# Patient Record
Sex: Female | Born: 1935 | Race: White | Hispanic: No | Smoking: Former smoker
Health system: Southern US, Community
[De-identification: ages and names within clinical notes are randomized; demographics above are authoritative.]

## PROBLEM LIST (undated history)

## (undated) DIAGNOSIS — E876 Hypokalemia: Secondary | ICD-10-CM

## (undated) DIAGNOSIS — S301XXA Contusion of abdominal wall, initial encounter: Secondary | ICD-10-CM

## (undated) DIAGNOSIS — E7800 Pure hypercholesterolemia, unspecified: Secondary | ICD-10-CM

## (undated) DIAGNOSIS — G629 Polyneuropathy, unspecified: Secondary | ICD-10-CM

## (undated) DIAGNOSIS — Z9989 Dependence on other enabling machines and devices: Secondary | ICD-10-CM

## (undated) DIAGNOSIS — Z9981 Dependence on supplemental oxygen: Secondary | ICD-10-CM

## (undated) DIAGNOSIS — M545 Low back pain, unspecified: Secondary | ICD-10-CM

## (undated) DIAGNOSIS — I219 Acute myocardial infarction, unspecified: Secondary | ICD-10-CM

## (undated) DIAGNOSIS — Z9289 Personal history of other medical treatment: Secondary | ICD-10-CM

## (undated) DIAGNOSIS — C4491 Basal cell carcinoma of skin, unspecified: Secondary | ICD-10-CM

## (undated) DIAGNOSIS — N183 Chronic kidney disease, stage 3 (moderate): Secondary | ICD-10-CM

## (undated) DIAGNOSIS — G709 Myoneural disorder, unspecified: Secondary | ICD-10-CM

## (undated) DIAGNOSIS — I5022 Chronic systolic (congestive) heart failure: Secondary | ICD-10-CM

## (undated) DIAGNOSIS — Z95810 Presence of automatic (implantable) cardiac defibrillator: Secondary | ICD-10-CM

## (undated) DIAGNOSIS — F329 Major depressive disorder, single episode, unspecified: Secondary | ICD-10-CM

## (undated) DIAGNOSIS — G8929 Other chronic pain: Secondary | ICD-10-CM

## (undated) DIAGNOSIS — D62 Acute posthemorrhagic anemia: Secondary | ICD-10-CM

## (undated) DIAGNOSIS — M109 Gout, unspecified: Secondary | ICD-10-CM

## (undated) DIAGNOSIS — M1990 Unspecified osteoarthritis, unspecified site: Secondary | ICD-10-CM

## (undated) DIAGNOSIS — T4145XA Adverse effect of unspecified anesthetic, initial encounter: Secondary | ICD-10-CM

## (undated) DIAGNOSIS — I480 Paroxysmal atrial fibrillation: Secondary | ICD-10-CM

## (undated) DIAGNOSIS — I10 Essential (primary) hypertension: Secondary | ICD-10-CM

## (undated) DIAGNOSIS — Z950 Presence of cardiac pacemaker: Secondary | ICD-10-CM

## (undated) DIAGNOSIS — T8859XA Other complications of anesthesia, initial encounter: Secondary | ICD-10-CM

## (undated) DIAGNOSIS — R5381 Other malaise: Secondary | ICD-10-CM

## (undated) DIAGNOSIS — G4733 Obstructive sleep apnea (adult) (pediatric): Secondary | ICD-10-CM

## (undated) DIAGNOSIS — J189 Pneumonia, unspecified organism: Secondary | ICD-10-CM

## (undated) DIAGNOSIS — I509 Heart failure, unspecified: Secondary | ICD-10-CM

## (undated) DIAGNOSIS — I2510 Atherosclerotic heart disease of native coronary artery without angina pectoris: Secondary | ICD-10-CM

## (undated) HISTORY — PX: CARDIAC CATHETERIZATION: SHX172

## (undated) HISTORY — PX: CHOLECYSTECTOMY OPEN: SUR202

## (undated) HISTORY — PX: CATARACT EXTRACTION, BILATERAL: SHX1313

## (undated) HISTORY — DX: Hypokalemia: E87.6

## (undated) HISTORY — DX: Other malaise: R53.81

## (undated) HISTORY — PX: TOTAL ABDOMINAL HYSTERECTOMY: SHX209

## (undated) HISTORY — PX: TONSILLECTOMY: SUR1361

## (undated) HISTORY — DX: Contusion of abdominal wall, initial encounter: S30.1XXA

## (undated) HISTORY — PX: ACHILLES TENDON SURGERY: SHX542

---

## 1981-06-18 HISTORY — PX: BREAST CYST EXCISION: SHX579

## 1999-06-30 ENCOUNTER — Encounter: Payer: Self-pay | Admitting: Obstetrics and Gynecology

## 1999-06-30 ENCOUNTER — Encounter: Admission: RE | Admit: 1999-06-30 | Discharge: 1999-06-30 | Payer: Self-pay | Admitting: Obstetrics and Gynecology

## 2000-07-01 ENCOUNTER — Encounter: Admission: RE | Admit: 2000-07-01 | Discharge: 2000-07-01 | Payer: Self-pay | Admitting: Internal Medicine

## 2000-07-01 ENCOUNTER — Encounter: Payer: Self-pay | Admitting: Internal Medicine

## 2001-07-08 ENCOUNTER — Encounter: Payer: Self-pay | Admitting: Internal Medicine

## 2001-07-08 ENCOUNTER — Encounter: Admission: RE | Admit: 2001-07-08 | Discharge: 2001-07-08 | Payer: Self-pay | Admitting: Internal Medicine

## 2002-07-09 ENCOUNTER — Encounter: Admission: RE | Admit: 2002-07-09 | Discharge: 2002-07-09 | Payer: Self-pay | Admitting: Internal Medicine

## 2002-07-09 ENCOUNTER — Encounter: Payer: Self-pay | Admitting: Internal Medicine

## 2003-08-16 ENCOUNTER — Encounter: Admission: RE | Admit: 2003-08-16 | Discharge: 2003-08-16 | Payer: Self-pay | Admitting: Internal Medicine

## 2003-09-07 ENCOUNTER — Inpatient Hospital Stay (HOSPITAL_COMMUNITY): Admission: EM | Admit: 2003-09-07 | Discharge: 2003-09-08 | Payer: Self-pay | Admitting: Emergency Medicine

## 2004-08-31 ENCOUNTER — Encounter: Admission: RE | Admit: 2004-08-31 | Discharge: 2004-08-31 | Payer: Self-pay | Admitting: Internal Medicine

## 2005-09-06 ENCOUNTER — Encounter: Admission: RE | Admit: 2005-09-06 | Discharge: 2005-09-06 | Payer: Self-pay | Admitting: Internal Medicine

## 2006-06-18 DIAGNOSIS — I219 Acute myocardial infarction, unspecified: Secondary | ICD-10-CM

## 2006-06-18 HISTORY — DX: Acute myocardial infarction, unspecified: I21.9

## 2006-08-08 ENCOUNTER — Inpatient Hospital Stay (HOSPITAL_COMMUNITY): Admission: EM | Admit: 2006-08-08 | Discharge: 2006-08-10 | Payer: Self-pay | Admitting: Emergency Medicine

## 2006-08-08 ENCOUNTER — Ambulatory Visit: Payer: Self-pay | Admitting: Vascular Surgery

## 2006-08-09 ENCOUNTER — Encounter (INDEPENDENT_AMBULATORY_CARE_PROVIDER_SITE_OTHER): Payer: Self-pay | Admitting: Interventional Cardiology

## 2006-08-28 ENCOUNTER — Inpatient Hospital Stay (HOSPITAL_COMMUNITY): Admission: EM | Admit: 2006-08-28 | Discharge: 2006-09-02 | Payer: Self-pay | Admitting: Emergency Medicine

## 2006-09-09 ENCOUNTER — Encounter: Admission: RE | Admit: 2006-09-09 | Discharge: 2006-09-09 | Payer: Self-pay | Admitting: Internal Medicine

## 2006-09-13 ENCOUNTER — Inpatient Hospital Stay (HOSPITAL_COMMUNITY): Admission: EM | Admit: 2006-09-13 | Discharge: 2006-09-20 | Payer: Self-pay | Admitting: Emergency Medicine

## 2007-01-17 HISTORY — PX: INSERT / REPLACE / REMOVE PACEMAKER: SUR710

## 2007-01-30 ENCOUNTER — Encounter: Admission: RE | Admit: 2007-01-30 | Discharge: 2007-01-30 | Payer: Self-pay | Admitting: Cardiology

## 2007-02-06 ENCOUNTER — Inpatient Hospital Stay (HOSPITAL_COMMUNITY): Admission: RE | Admit: 2007-02-06 | Discharge: 2007-02-07 | Payer: Self-pay | Admitting: Cardiology

## 2007-08-22 ENCOUNTER — Encounter: Admission: RE | Admit: 2007-08-22 | Discharge: 2007-08-22 | Payer: Self-pay | Admitting: Internal Medicine

## 2007-09-09 ENCOUNTER — Encounter: Admission: RE | Admit: 2007-09-09 | Discharge: 2007-09-09 | Payer: Self-pay | Admitting: Internal Medicine

## 2007-09-11 ENCOUNTER — Encounter: Admission: RE | Admit: 2007-09-11 | Discharge: 2007-09-11 | Payer: Self-pay | Admitting: Internal Medicine

## 2008-03-04 ENCOUNTER — Encounter: Admission: RE | Admit: 2008-03-04 | Discharge: 2008-03-04 | Payer: Self-pay | Admitting: Rheumatology

## 2008-03-05 ENCOUNTER — Encounter: Admission: RE | Admit: 2008-03-05 | Discharge: 2008-03-05 | Payer: Self-pay | Admitting: Internal Medicine

## 2008-05-20 ENCOUNTER — Encounter: Admission: RE | Admit: 2008-05-20 | Discharge: 2008-05-20 | Payer: Self-pay | Admitting: Rheumatology

## 2008-09-13 ENCOUNTER — Encounter: Admission: RE | Admit: 2008-09-13 | Discharge: 2008-09-13 | Payer: Self-pay | Admitting: Internal Medicine

## 2009-09-14 ENCOUNTER — Encounter: Admission: RE | Admit: 2009-09-14 | Discharge: 2009-09-14 | Payer: Self-pay | Admitting: Internal Medicine

## 2010-06-18 HISTORY — PX: KNEE LIGAMENT RECONSTRUCTION: SHX1895

## 2010-07-09 ENCOUNTER — Encounter: Payer: Self-pay | Admitting: Internal Medicine

## 2010-07-31 ENCOUNTER — Encounter: Payer: Self-pay | Admitting: Internal Medicine

## 2010-08-09 ENCOUNTER — Other Ambulatory Visit: Payer: Self-pay | Admitting: Internal Medicine

## 2010-08-09 DIAGNOSIS — Z1231 Encounter for screening mammogram for malignant neoplasm of breast: Secondary | ICD-10-CM

## 2010-08-09 DIAGNOSIS — E876 Hypokalemia: Secondary | ICD-10-CM | POA: Insufficient documentation

## 2010-08-09 DIAGNOSIS — G629 Polyneuropathy, unspecified: Secondary | ICD-10-CM | POA: Insufficient documentation

## 2010-08-09 DIAGNOSIS — Z95810 Presence of automatic (implantable) cardiac defibrillator: Secondary | ICD-10-CM | POA: Insufficient documentation

## 2010-08-09 DIAGNOSIS — I5032 Chronic diastolic (congestive) heart failure: Secondary | ICD-10-CM | POA: Insufficient documentation

## 2010-08-10 ENCOUNTER — Other Ambulatory Visit: Payer: Self-pay | Admitting: Orthopedic Surgery

## 2010-08-10 ENCOUNTER — Ambulatory Visit (INDEPENDENT_AMBULATORY_CARE_PROVIDER_SITE_OTHER): Payer: Medicare Other | Admitting: Internal Medicine

## 2010-08-10 ENCOUNTER — Ambulatory Visit (HOSPITAL_COMMUNITY)
Admission: RE | Admit: 2010-08-10 | Discharge: 2010-08-10 | Disposition: A | Discharge status: Home / Self Care | Payer: Medicare Other | Source: Ambulatory Visit | Attending: Orthopedic Surgery | Admitting: Orthopedic Surgery

## 2010-08-10 ENCOUNTER — Other Ambulatory Visit (HOSPITAL_COMMUNITY): Payer: Self-pay | Admitting: Orthopedic Surgery

## 2010-08-10 ENCOUNTER — Encounter (HOSPITAL_COMMUNITY): Payer: Medicare Other

## 2010-08-10 ENCOUNTER — Encounter: Payer: Self-pay | Admitting: Internal Medicine

## 2010-08-10 DIAGNOSIS — Z95810 Presence of automatic (implantable) cardiac defibrillator: Secondary | ICD-10-CM | POA: Insufficient documentation

## 2010-08-10 DIAGNOSIS — I5022 Chronic systolic (congestive) heart failure: Secondary | ICD-10-CM

## 2010-08-10 DIAGNOSIS — Z01818 Encounter for other preprocedural examination: Secondary | ICD-10-CM

## 2010-08-10 DIAGNOSIS — I252 Old myocardial infarction: Secondary | ICD-10-CM | POA: Insufficient documentation

## 2010-08-10 DIAGNOSIS — Z01812 Encounter for preprocedural laboratory examination: Secondary | ICD-10-CM | POA: Insufficient documentation

## 2010-08-10 LAB — CBC
HCT: 39.8 % (ref 36.0–46.0)
MCH: 31.4 pg (ref 26.0–34.0)
MCHC: 31.9 g/dL (ref 30.0–36.0)
MCV: 98.5 fL (ref 78.0–100.0)
Platelets: 294 10*3/uL (ref 150–400)
RDW: 14.1 % (ref 11.5–15.5)
WBC: 10.2 10*3/uL (ref 4.0–10.5)

## 2010-08-10 LAB — URINALYSIS, ROUTINE W REFLEX MICROSCOPIC
Ketones, ur: NEGATIVE mg/dL
Protein, ur: NEGATIVE mg/dL
Urobilinogen, UA: 0.2 mg/dL (ref 0.0–1.0)

## 2010-08-10 LAB — PROTIME-INR
INR: 1.54 — ABNORMAL HIGH (ref 0.00–1.49)
Prothrombin Time: 18.7 seconds — ABNORMAL HIGH (ref 11.6–15.2)

## 2010-08-10 LAB — BASIC METABOLIC PANEL
BUN: 24 mg/dL — ABNORMAL HIGH (ref 6–23)
Calcium: 10.2 mg/dL (ref 8.4–10.5)
Creatinine, Ser: 1.29 mg/dL — ABNORMAL HIGH (ref 0.4–1.2)
GFR calc Af Amer: 49 mL/min — ABNORMAL LOW (ref >60)

## 2010-08-10 LAB — DIFFERENTIAL
Basophils Absolute: 0 10*3/uL (ref 0.0–0.1)
Basophils Relative: 0 % (ref 0–1)
Eosinophils Absolute: 0.4 10*3/uL (ref 0.0–0.7)
Eosinophils Relative: 4 % (ref 0–5)
Monocytes Absolute: 0.8 10*3/uL (ref 0.1–1.0)

## 2010-08-10 LAB — SURGICAL PCR SCREEN
MRSA, PCR: NEGATIVE
Staphylococcus aureus: POSITIVE — AB

## 2010-08-10 LAB — URINE MICROSCOPIC-ADD ON

## 2010-08-14 ENCOUNTER — Ambulatory Visit (HOSPITAL_COMMUNITY)
Admission: RE | Admit: 2010-08-14 | Discharge: 2010-08-14 | Disposition: A | Discharge status: Home / Self Care | Payer: Medicare Other | Source: Ambulatory Visit | Attending: Orthopedic Surgery | Admitting: Orthopedic Surgery

## 2010-08-14 DIAGNOSIS — Z95810 Presence of automatic (implantable) cardiac defibrillator: Secondary | ICD-10-CM | POA: Insufficient documentation

## 2010-08-14 DIAGNOSIS — M659 Unspecified synovitis and tenosynovitis, unspecified site: Secondary | ICD-10-CM | POA: Insufficient documentation

## 2010-08-14 DIAGNOSIS — I10 Essential (primary) hypertension: Secondary | ICD-10-CM | POA: Insufficient documentation

## 2010-08-14 DIAGNOSIS — M2240 Chondromalacia patellae, unspecified knee: Secondary | ICD-10-CM | POA: Insufficient documentation

## 2010-08-14 DIAGNOSIS — Z01812 Encounter for preprocedural laboratory examination: Secondary | ICD-10-CM | POA: Insufficient documentation

## 2010-08-14 DIAGNOSIS — I509 Heart failure, unspecified: Secondary | ICD-10-CM | POA: Insufficient documentation

## 2010-08-14 DIAGNOSIS — G4733 Obstructive sleep apnea (adult) (pediatric): Secondary | ICD-10-CM | POA: Insufficient documentation

## 2010-08-14 DIAGNOSIS — I2510 Atherosclerotic heart disease of native coronary artery without angina pectoris: Secondary | ICD-10-CM | POA: Insufficient documentation

## 2010-08-14 DIAGNOSIS — M23349 Other meniscus derangements, anterior horn of lateral meniscus, unspecified knee: Secondary | ICD-10-CM | POA: Insufficient documentation

## 2010-08-14 DIAGNOSIS — M23329 Other meniscus derangements, posterior horn of medial meniscus, unspecified knee: Secondary | ICD-10-CM | POA: Insufficient documentation

## 2010-08-14 DIAGNOSIS — I252 Old myocardial infarction: Secondary | ICD-10-CM | POA: Insufficient documentation

## 2010-08-14 LAB — PROTIME-INR: Prothrombin Time: 13.7 seconds (ref 11.6–15.2)

## 2010-08-15 NOTE — Assessment & Plan Note (Signed)
Summary: reached eri/[facility] dr Amil Amen.mdt icd/medicare/[phone]/mt   Visit Type:  Initial Consult   History of Present Illness: Bianca [name] is referred today by Dr. Anne Fu to establish for ICD followup. She is also at device ERI.  The patient had previously been followed by Dr. Amil Amen. She has chronic systolic heart failure and HTN. She underwent BiV ICD implant 3.5 yrs ago. She has reached device ERI. She denies any intercurrent ICD therapies. No peripheral edema. No syncope.   Current Medications (verified): 1)  Warfarin Sodium 5 Mg Tabs (Warfarin Sodium) .... Use As Directed By Anticoagulation Clinic 2)  Pravastatin Sodium 80 Mg Tabs (Pravastatin Sodium) .... Take One Tablet By Mouth Daily At Bedtime 3)  Colcrys 0.6 Mg Tabs (Colchicine) .... Every Other Day 4)  Zyloprim 100 Mg Tabs (Allopurinol) .... Once Daily 5)  Furosemide 40 Mg Tabs (Furosemide) .... Take 1/2  Tablet By Mouth Daily. 6)  Carvedilol 12.5 Mg Tabs (Carvedilol) .... Take One Tablet By Mouth Twice A Day 7)  Neurontin 800 Mg Tabs (Gabapentin) .Marland Kitchen.. 1 Tab 4 Times A Day 8)  Lopid 600 Mg Tabs (Gemfibrozil) .... Two Times A Day 9)  Pamelor 25 Mg Caps (Nortriptyline Hcl) .... At Bedtime 10)  Diovan 80 Mg Tabs (Valsartan) .... At Bedtime 11)  Calcium Carbonate   Powd (Calcium Carbonate) .... Two Times A Day 12)  Vitamin D 1000 Unit  Tabs (Cholecalciferol) .... Once Daily 13)  Multivitamins   Tabs (Multiple Vitamin) .... Once Daily  Allergies (verified): 1)  ! Accupril  Past History:  Past Medical History: Last updated: 08/09/2010 Current Problems:  IMPLANTATION OF DEFIBRILLATOR, HX OF (ICD-V45.02) PERIPHERAL NEUROPATHY (ICD-356.9) HYPOKALEMIA (ICD-276.8) CHF (ICD-428.0)    Family History: Last updated: 08/10/2010 No premature CAD  Social History: Last updated: 08/10/2010 Denies ETOH or tobacco abuse.  Past Surgical History: s/p ICD 2008  Family History: No premature CAD  Social History: Denies  ETOH or tobacco abuse.  Review of Systems  The patient denies chest pain, syncope, dyspnea on exertion, and peripheral edema.    Vital Signs:  Patient profile:   77 year old female Height:      66 inches Weight:      248 pounds BMI:     30.53 Pulse rate:   68 / minute BP sitting:   120 / 70  (left arm)  Vitals Entered By: Laurance Flatten CMA (August 10, 2010 3:33 PM)  Physical Exam  General:  Obese, elderly, well developed, well nourished, in no acute distress.  HEENT: normal Neck: supple. No JVD. Carotids 2+ bilaterally no bruits Cor: RRR no rubs, gallops or murmur Lungs: CTA. Well healed ICD incision. Ab: soft, nontender. nondistended. No HSM. Good bowel sounds Ext: warm. no cyanosis, clubbing or edema Neuro: alert and oriented. Grossly nonfocal. affect pleasant    MD Comments:  Normal device function. ERI.  Impression & Recommendations:  Problem # 1:  IMPLANTATION OF DEFIBRILLATOR, HX OF (ICD-V45.02) Her device is at ERI. Will schedule ICD generator change.  Problem # 2:  CHF (ICD-428.0) Her symptoms are class 2 s/p BiV ICD. She will maintain a low sodium diet and continue her medical therapy. The following medications were removed from the medication list:    Avapro 150 Mg Tabs (Irbesartan) .Marland Kitchen..Marland Kitchen Two times a day    Carvedilol 25 Mg Tabs (Carvedilol) .Marland Kitchen... Take one tablet by mouth twice a day    Aldactone 50 Mg Tabs (Spironolactone) .Marland Kitchen... 1/2 tablet daily Her updated medication list for this problem  includes:    Warfarin Sodium 5 Mg Tabs (Warfarin sodium) ..... Use as directed by anticoagulation clinic    Furosemide 40 Mg Tabs (Furosemide) .Marland Kitchen... Take 1/2  tablet by mouth daily.    Carvedilol 12.5 Mg Tabs (Carvedilol) .Marland Kitchen... Take one tablet by mouth twice a day    Diovan 80 Mg Tabs (Valsartan) .Marland Kitchen... At bedtime  Patient Instructions: 1)  Generator chage scheduled for 09/07/10

## 2010-08-15 NOTE — Op Note (Signed)
Bianca [name], Bianca [name]              ACCOUNT NO.:  [id number]  MEDICAL RECORD NO.:  [id number]           PATIENT TYPE:  O  LOCATION:  DAYL                         FACILITY:  [facility]  PHYSICIAN:  Madlyn Frankel. Charlann Boxer, M.D.  DATE OF BIRTH:  09-04-1932  DATE OF PROCEDURE:  08/14/2010 DATE OF DISCHARGE:                              OPERATIVE REPORT   PREOPERATIVE DIAGNOSIS:  Left knee medial meniscal tear associated with degenerative changes.  POSTOPERATIVE DIAGNOSES/FINDINGS: 1. Medial meniscal tear, posterior horn. 2. Anterior horn midbody lateral meniscal tear. 3. 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 4. Synovitis.  PROCEDURES: 1. Left knee diagnostic and operative arthroscopy with medial and     lateral partial meniscectomies. 2. Medial and lateral chondral debridements. 3. Synovectomy.  SURGEON:  Madlyn Frankel. Charlann Boxer, M.D.  ASSISTANT:  Surgical team.  ANESTHESIA:  General anesthetic plus local post-procedure anesthesia.SPECIMENS:  None.  COMPLICATIONS:  None.  INDICATION FOR THE PROCEDURE:  Bianca [name] is a 77 year old female patient of mine with persistent knee symptoms despite conservative measures.  We reviewed options of knee arthroscopy versus continued observation versus her having joint replacement.  All parties involved felt the knee arthroscopy was the best way to proceed in trying manage her knee in a bit more conservative way.  Consent was obtained for benefit of pain relief; understanding risks of infection, DVT, as well as persistent problems, as well as potential for knee replacement.  PROCEDURE IN DETAIL:  The patient was brought to the operative theater. Once adequate anesthesia, preoperative antibiotics, Ancef administered, the patient was positioned supine with the left leg in a leg holder. The left lower extremity was then prepped and draped in sterile fashion. A time-out was performed, identifying the patient, planned procedure, and  the extremity.  Standard inferomedial, superomedial, inferolateral portals were utilized.  Diagnostic evaluation of the knee revealed the above findings, including significant amount of chondral debris in the knee that was suctioned out, as well as the meniscal pathology. Initially, the inferomedial portal was utilized as a working portal.  I used a biting basket to trim away the posterior horn meniscal tear, then used a 3.5 Cuda shaver to remove remaining meniscal fragments as well as contouring the posterior horn back to itself.  Chondral debridement was also carried out in the medial femoral condyle; the tibial plateau was better intact.  The lateral compartment was addressed initially through the medial portal, debriding some of the mid body meniscal pathology identified.  I then switched portals using the inferior lateral portal as the working portal to debride the remaining meniscus on this anterior horn mid body junction.  I then returned with the scope in the lateral compartment and finished up this debridement through the medial portal.  There were some chondral defect and arthritic changes noted in this lateral compartment, which was stabilized by debridement of chondral flaps; no microfracture carried out.  Anteriorly, a pretty significant synovectomy was carried out over the anterior and medial anterior aspect of the knee as it seemed to be impinging into the patellofemoral joint. Her patellofemoral joint was relatively intact, I am certain, with some chondral  thinning, but no large defects and no chondroplasty carried out.  I reexamined the knee to make certain that I was satisfied with the removal of her pathology.  The portal site instrumentation was removed. Portal sites reapproximated with a 4-0 nylon.  The knee was injected with 0.25% Marcaine with epinephrine at the end.  The knee was dressed into a sterile bulky Jones dressing.  She was then brought to the recovery  room in stable condition, tolerating the procedure well, with a knee wrap on.     Madlyn Frankel Charlann Boxer, M.D.     MDO/MEDQ  D:  08/14/2010  T:  08/14/2010  Job:  [id number]  Electronically Signed by Durene Romans M.D. on 08/15/2010 07:08:51 AM

## 2010-08-15 NOTE — Letter (Signed)
Summary: Implantable Device Instructions  Architectural technologist, Main Office  [address]   [place], [place] [address]   Phone: [phone]  Fax: [phone]      Implantable Device Instructions  You are scheduled for:  _____ Generator Change  on 3/22//12 with Dr. Ladona Ridgel.  1.  Please arrive at the Short Stay Center at [facility] at 5:30am on the day of your procedure.  2.  Do not eat or drink after midnight the night before your procedure.  3.  Complete lab work on 08/31/10 at 11am.  .  You do not have to be fasting.  4.  Do NOT take these medications for the morning of your procedure Furosemide.  Take your last dose of Coumadin on--will call if you need to hold.  5.  Plan for an overnight stay.  Bring your insurance cards and a list of your medications.  6.  Wash your chest and neck with antibacterial soap (any brand) the evening before and the morning of your procedure.  Rinse well.  *If you have ANY questions after you get home, please call the office [phone]. Anselm Pancoast  *Every attempt is made to prevent procedures from being rescheduled.  Due to the nauture of Electrophysiology, rescheduling can happen.  The physician is always aware and directs the staff when this occurs.

## 2010-08-24 ENCOUNTER — Telehealth: Payer: Self-pay | Admitting: Internal Medicine

## 2010-08-29 NOTE — Progress Notes (Signed)
Summary: HAS MED QUESTION  Phone Note Call from Patient   Caller: Patient [phone] Reason for Call: Talk to Nurse Summary of Call: PT CALLING RE CHANGING OUT DEFIB, DOES SHE NEED TO STOP COUMADIN AND HOW SOON? Initial call taken by: Glynda Jaeger,  August 24, 2010 10:45 AM  Follow-up for Phone Call        do not hold Coumadin will draw labs on 3/15 and if INR is in < 2.7 will not hold  pt aware Dennis Bast, RN, BSN  August 24, 2010 12:07 PM

## 2010-08-31 ENCOUNTER — Other Ambulatory Visit: Payer: Medicare Other

## 2010-08-31 ENCOUNTER — Other Ambulatory Visit: Payer: Self-pay | Admitting: Internal Medicine

## 2010-08-31 ENCOUNTER — Encounter: Payer: Self-pay | Admitting: Internal Medicine

## 2010-08-31 ENCOUNTER — Other Ambulatory Visit (INDEPENDENT_AMBULATORY_CARE_PROVIDER_SITE_OTHER): Payer: Medicare Other

## 2010-08-31 DIAGNOSIS — I509 Heart failure, unspecified: Secondary | ICD-10-CM

## 2010-08-31 LAB — CBC WITH DIFFERENTIAL/PLATELET
Basophils Absolute: 0 10*3/uL (ref 0.0–0.1)
Eosinophils Relative: 3.4 % (ref 0.0–5.0)
HCT: 38.9 % (ref 36.0–46.0)
Lymphocytes Relative: 35.9 % (ref 12.0–46.0)
Monocytes Relative: 8.9 % (ref 3.0–12.0)
Neutrophils Relative %: 51.2 % (ref 43.0–77.0)
Platelets: 268 10*3/uL (ref 150.0–400.0)
RDW: 14.1 % (ref 11.5–14.6)
WBC: 8.3 10*3/uL (ref 4.5–10.5)

## 2010-08-31 LAB — APTT: aPTT: 37 s — ABNORMAL HIGH (ref 21.7–28.8)

## 2010-08-31 LAB — PROTIME-INR
INR: 2.1 ratio — ABNORMAL HIGH (ref 0.8–1.0)
Prothrombin Time: 21.9 s — ABNORMAL HIGH (ref 10.2–12.4)

## 2010-08-31 LAB — BASIC METABOLIC PANEL
BUN: 35 mg/dL — ABNORMAL HIGH (ref 6–23)
Calcium: 10.4 mg/dL (ref 8.4–10.5)
GFR: 46 mL/min — ABNORMAL LOW (ref >60.00)
Glucose, Bld: 82 mg/dL (ref 70–99)
Potassium: 4.8 mEq/L (ref 3.5–5.1)

## 2010-09-04 ENCOUNTER — Encounter: Payer: Self-pay | Admitting: Internal Medicine

## 2010-09-05 NOTE — Cardiovascular Report (Signed)
Summary: Office Visit  Office Visit   Imported By: Marylou Mccoy 08/21/2010 15:36:25  _____________________________________________________________________  External Attachment:    Type:   Image     Comment:   External Document

## 2010-09-07 ENCOUNTER — Ambulatory Visit (HOSPITAL_COMMUNITY)
Admission: RE | Admit: 2010-09-07 | Discharge: 2010-09-07 | Disposition: A | Discharge status: Home / Self Care | Payer: Medicare Other | Source: Ambulatory Visit | Attending: Internal Medicine | Admitting: Internal Medicine

## 2010-09-07 DIAGNOSIS — I509 Heart failure, unspecified: Secondary | ICD-10-CM | POA: Insufficient documentation

## 2010-09-07 DIAGNOSIS — I428 Other cardiomyopathies: Secondary | ICD-10-CM

## 2010-09-07 DIAGNOSIS — Z4502 Encounter for adjustment and management of automatic implantable cardiac defibrillator: Secondary | ICD-10-CM | POA: Insufficient documentation

## 2010-09-07 DIAGNOSIS — I447 Left bundle-branch block, unspecified: Secondary | ICD-10-CM | POA: Insufficient documentation

## 2010-09-07 LAB — BASIC METABOLIC PANEL
BUN: 29 mg/dL — ABNORMAL HIGH (ref 6–23)
Calcium: 9.3 mg/dL (ref 8.4–10.5)
Creatinine, Ser: 1.26 mg/dL — ABNORMAL HIGH (ref 0.4–1.2)
GFR calc non Af Amer: 41 mL/min — ABNORMAL LOW (ref >60)
Glucose, Bld: 96 mg/dL (ref 70–99)
Potassium: 4.5 mEq/L (ref 3.5–5.1)

## 2010-09-07 LAB — PROTIME-INR
INR: 2.16 — ABNORMAL HIGH (ref 0.00–1.49)
Prothrombin Time: 24.2 seconds — ABNORMAL HIGH (ref 11.6–15.2)

## 2010-09-07 LAB — SURGICAL PCR SCREEN: Staphylococcus aureus: NEGATIVE

## 2010-09-14 NOTE — Letter (Signed)
Summary: Implantable Device Instructions  Architectural technologist, Main Office  [address]   [place], [place] [address]   Phone: [phone]  Fax: [phone]      Implantable Device Instructions  You are scheduled for:  _____ Permanent Transvenous Pacemaker ___X__ Implantable Cardioverter Defibrillator _____ Implantable Loop Recorder _____ Generator Change  on 09/07/2010 with Dr. Lewayne Bunting.  1.  Please arrive at the Short Stay Center at [facility] at  5:30 am on the day of your procedure.  2.  Do not eat or drink the night before your procedure.  3.  Complete lab work on _03/15/2012 .  The lab at [facility] is open from 8:30 AM to 1:30 PM and from 2:30 PM to 5:00 PM.  The lab at [facility] is open from 7:30 AM to 5:30 PM.  You do not have to be fasting.  4.  Do NOT your Furosemide the day of  procedure.    5.  Plan for an overnight stay.  Bring your insurance cards and a list of your medications.  6.  Wash your chest and neck with antibacterial soap (any brand) the evening before and the morning of your procedure.  Rinse well.  7.  Education material received:     Pacemaker _____           ICD __X___           Arrhythmia _____  *If you have ANY questions after you get home, please call the office [phone].  *Every attempt is made to prevent procedures from being rescheduled.  Due to the nauture of Electrophysiology, rescheduling can happen.  The physician is always aware and directs the staff when this occurs.

## 2010-09-19 ENCOUNTER — Ambulatory Visit: Payer: Medicare Other

## 2010-09-22 NOTE — Op Note (Signed)
  [name], Bianca [name]              ACCOUNT NO.:  [id number]  MEDICAL RECORD NO.:  [id number]           PATIENT TYPE:  LOCATION:                                 FACILITY:  PHYSICIAN:  Doylene Canning. Ladona Ridgel, MD    DATE OF BIRTH:  [birth year]  DATE OF PROCEDURE:  09/08/2010 DATE OF DISCHARGE:                              OPERATIVE REPORT   PROCEDURE PERFORMED:  Removal of a previously implanted Bi-V ICD with elective replacement and insertion of a new Bi-V device with defibrillation threshold testing.  INTRODUCTION:  The patient is a 77 year old woman with longstanding cardiomyopathy, left bundle-branch block and congestive heart failure, status post Bi-V ICD insertion which has reached ERI.  She is now referred for device generator change.  PROCEDURE:  After informed was obtained, the patient was taken to the diagnostic EP lab in a fasting state.  After usual preparation and draping, intravenous fentanyl and midazolam was given for sedation.  A 30 mL of lidocaine was infiltrated into the left infraclavicular region. A 7-cm incision was carried out over this region.  Electrocautery was utilized to dissect down to the fascial plane.  The ICD pocket was entered with electrocautery and gentle traction was utilized to remove the device from its pocket.  The leads were disconnected from the old device and analyzed.  A new Medtronic Protecta Bi-V ICD serial number [id number] was connected to the defibrillation leads and pacing leads and placed back in the subcutaneous pocket.  The pocket was irrigated with antibiotic irrigation.  Electrocautery was utilized to assure hemostasis and the patient was more deeply sedated for defibrillation threshold testing.  After the patient was more deeply sedated with fentanyl and Versed, VF was induced with a T-wave shock.  A 20 joules shock was subsequent delivered, which terminated VF and restoring sinus rhythm.  No additional defibrillation threshold  testing was carried out. The incision was closed with 2-0 and 3-0 Vicryl.  Benzoin and Steri-Strips were painted on the skin.  A pressure dressing was applied and the patient was returned to her room in satisfactory condition.  The were no immediate procedure complications.  RESULTS:  Demonstrate successful removal of previous implanted Medtronic Bi-V ICD had reached ERI and insertion of a new Bi-V ICD with defibrillation threshold testing.     Doylene Canning. Ladona Ridgel, MD     GWT/MEDQ  D:  09/07/2010  T:  09/08/2010  Job:  [id number]  cc:   Jake Bathe, MD  Electronically Signed by Lewayne Bunting MD on 09/22/2010 06:56:19 AM

## 2010-09-28 ENCOUNTER — Ambulatory Visit
Admission: RE | Admit: 2010-09-28 | Discharge: 2010-09-28 | Disposition: A | Discharge status: Home / Self Care | Payer: Medicare Other | Source: Ambulatory Visit | Attending: Internal Medicine | Admitting: Internal Medicine

## 2010-09-28 DIAGNOSIS — Z1231 Encounter for screening mammogram for malignant neoplasm of breast: Secondary | ICD-10-CM

## 2010-10-31 NOTE — Op Note (Signed)
Bianca [name], Bianca [name]              ACCOUNT NO.:  [id number]   MEDICAL RECORD NO.:  [id number]          PATIENT TYPE:  INP   LOCATION:  6525                         FACILITY:  [facility]   PHYSICIAN:  Francisca December, M.D.  DATE OF BIRTH:  [birth year]   DATE OF PROCEDURE:  02/06/2007  DATE OF DISCHARGE:  02/07/2007                               OPERATIVE REPORT   PROCEDURES PERFORMED:  1. Insertion implantable cardiac defibrillator with biventricular      pacing.  2. Left subclavian venogram.  3. Coronary sinus venogram.  4. Defibrillation threshold testing.   INDICATION:  Bianca [name] is a 77 year old woman with an ischemic  cardiomyopathy, last LVEF was 20-25% by 2D echocardiogram in July 2008.  She has New York Heart Association class III symptoms of heart failure  with dyspnea and fatigue.  Finally, she has a widened QRS on resting ECG  greater than 120 milliseconds.  She is, therefore, brought to the  catheterization laboratory for insertion of the above device under MADIT  II criteria.   PROCEDURE NOTE:  The patient is brought to the cardiac catheterization  laboratory in a fasting state.  The left prepectoral region was prepped  and draped in the usual sterile fashion.  Local anesthesia was obtained  with infiltration of 1% lidocaine with epinephrine throughout the left  prepectoral region.  A left subclavian venogram was then performed with  a peripheral injection of 20 mL of Omnipaque.  A digital cineangiogram  was obtained and road mapped to guide future left subclavian puncture.  The angiogram did demonstrate the vein to be widely patent and coursing  in a normal fashion over the anterior surface of the first rib and  beneath the middle third of the clavicle.  There was no evidence for  persistence of the left superior vena cava.   A 7-8 cm incision was then made in the deltopectoral groove and this was  carried down by sharp dissection and electrocautery to the  prepectoral  fascia there.  There, a plane was lifted and a pocket formed inferiorly  and medially using blunt dissection with electrocautery.  The pocket was  then packed with 1% kanamycin soaked gauze.  Three separate left  subclavian punctures were then performed with an 18 gauge thin wall  needle through which was passed a 0.035 inch tight J guidewire.  Over  the initial guidewire, a 9 French tearaway sheath and dilator was  advanced.  The dilator and wire were removed and the right ventricular  lead was advanced to the level of the right atrium.  The sheath was torn  away.  Using standard technique and fluoroscopic landmarks, the lead was  manipulated in the right ventricular apex.  There, excellent pacing  parameters were obtained as will be noted below.  The lead was tested  for diaphragmatic pacing at 10 volts and none was found.  The lead was  then sutured into place using three separate silk ligatures.   Over the second guidewire a 7 French tearaway sheath and dilator were  advanced.  The dilator and wire were removed and atrial  lead was  advanced to the level of the right atrium and the sheath was torn away.  Again, using standard technique and fluoroscopic landmarks, the lead was  manipulated into the right atrial appendage.  There, adequate pacing  parameters were obtained as will be noted below.  Both the atrial and  ventricular leads were active fixation and the screws were advanced as  appropriate.  The atrial lead was tested for diaphragmatic pacing at 10  volts and none was found.  The lead was then sutured into place using  three separate silk ligatures.   Over the remaining guidewire, a Medtronic MB2 guiding catheter with  dilator was advanced into the right atrium.  The dilator was removed  and, using standard technique and fluoroscopic landmarks, the coronary  sinus was eventually cannulated using a Wholey wire.  The guiding  catheter was advanced into the coronary  sinus and coronary sinus  venogram was performed with a balloon obstructing catheter in a  retrograde fashion.  LAO and RAO projections were obtained.  A large  left ventricular lateral vein was identified and this was subsequently  cannulated using a 0.014 inch Prowater guidewire.  Over this guidewire,  the Medtronic bipolar lead was advanced into place.  Initial positioning  did not provide an adequate separation between chest wall pacing and the  left ventricular pacing threshold.  Therefore, the Prowater wire was re-  advanced and the lead was advanced into a more apical position.  There,  excellent pacing parameters were obtained as will be noted below.  The  lead failed to pace the chest wall or diaphragm up to 10 volts.  The  guiding catheter was then removed by the slit technique.  The left  ventricular lead was sutured into place using three separate silk  ligatures.   The kanamycin soaked gauze was removed from the pocket and the pocket  was then copiously irrigated using 1% kanamycin solution.  The leads  were then attached to the pace shock generator carefully identifying  each and placing each into the appropriate receptacle under the  supervision of the Medtronic representative.  Each lead was tightened  into place and tested for security.  The leads were then wound beneath  the pacing generator and the generator was placed in the pocket.   We then prepared for defibrillation threshold testing.  The patient  received adequate doses of Versed and fentanyl to achieve moderate  sedation.  We then induced ventricular fibrillation by the shock on T  technique with a pacing train.  Initial attempts to at initiate  ventricular fibrillation with a five beat pacing train and also with 50  Hz pacing for up to 4 seconds were unsuccessful.  As mentioned,  ventricular fibrillation was induced after the eight beat pacing train.  The device promptly detected this, charged to 20 joules,  and delivered;  however, ventricular fibrillation was not terminated.  The device re-  detected, charged at 25 joules, and delivered with prompt return of  sinus rhythm.  The initial shock impedance was 44 ohms and subsequent  shock impedance was 50 ohms.  The total detection charge and shock  delivered with successful defibrillation was 13 seconds.  There were no  dropouts at 1.2 mV sensitivity.   The wound was then closed using 2-0 Vicryl in a running fashion for the  subcutaneous layers.  Two layers applied.  The skin was approximated  using 4-0 Vicryl in a running subcuticular fashion.  Steri-Strips and  sterile dressing were applied.  The patient was transported to the  recovery area in stable condition.   EQUIPMENT DATA:  The pacing shock generator is a Medtronic [place],  model number [id number], serial number [id number].  The atrial lead is  Medtronic model number [id number], serial number [id number].  The right  ventricular lead is a Advertising copywriter number [id number],  serial number [id number].  The left ventricular lead is a Medtronic  model number [id number], serial number [id number].   PACING DATA:  The atrial lead detected a 2.2 mV P-wave.  The pacing  threshold was 1.7 volts at 0.5 second pulse width.  The impedance was  591 ohms resulting in a current at capture threshold of 4.1 MA.  The  right ventricular lead detected 20.5 mV R-wave.  The pacing threshold  was 1 volt at 0.5 milliseconds pulse width.  The impedance was 899 ohms  resulting in a current at capture threshold of 1.4 MA.  The left  ventricular lead detected a 21.3 mV R wave.  The pacing threshold was  1.7 volts at 0.5 milliseconds pulse width.  The impedance was 1236 ohms  resulting in a current at capture threshold of 1.1 MA.      Francisca December, M.D.  Electronically Signed     JHE/MEDQ  D:  02/06/2007  T:  02/07/2007  Job:  [id number]

## 2010-11-03 NOTE — Discharge Summary (Signed)
NAMEMarland Kitchen  Bianca, [name] NO.:  [id number]   MEDICAL RECORD NO.:  [id number]          PATIENT TYPE:  INP   LOCATION:  3702                         FACILITY:  [facility]   PHYSICIAN:  Theressa Millard, M.D.    DATE OF BIRTH:  08/02/1932   DATE OF ADMISSION:  09/12/2006  DATE OF DISCHARGE:  09/20/2006                               DISCHARGE SUMMARY   ADMITTING DIAGNOSIS:  Pneumonia.   DISCHARGE DIAGNOSES:  1. Congestive heart failure, acute on chronic systolic congestive      heart failure.  2. Probable pneumonia, treated with adequate antibiotics.  3. Renal insufficiency, resolved.  4. Anemia, most consistent with iron deficiency, further workup      pending.  5. Probable gout.  6. Abdominal bloating, resolved.  7. Hypokalemia.  8. Peripheral neuropathy.   BRIEF HISTORY:  The patient is a 77 year old white female who recently  developed problems with congestive heart failure.  She has a segmental  wall motion abnormality on ultrasound of the heart and on nuclear  imaging.  However, her coronary arteries were clean.  She was admitted  with shortness of breath and initial chest x-ray was thought to be most  consistent with pneumonia.   HOSPITAL COURSE:  The patient was admitted and initially hydrated.  Her  BUN and creatinine were elevated and it was thought to be that she was  dehydrated.  However after about 3 days in the hospital with lack of  improvement, I was finally able to review her old chart in the office  and it became apparent the patient received indomethacin, prednisone and  colchicine several days prior to admission.  Therefore, it was thought  that the patient's azotemia was related to Indocin use.  She was seen in  consultation by Dr. Amil Amen who agreed, noted the patient's elevated BNP  at over 3000 and evidence of heart failure on chest x-ray.  Therefore,  the patient was transferred to telemetry and started on milrinone.  This  afforded her  tremendous relief in her shortness of breath, good diuresis  and drop in her BNP into the 700 level.  BUN and creatinine normalized.  Initially her potassium dropped but then normalized as well with  replacement.   No further evaluation was done of her cardiac status as she has had  tests recently that gave the results outlined above.  She had been on  Diovan prior to admission, this was held initially due to her azotemia  but this was resumed at discharge after milrinone was discontinued.  She  felt steadily better throughout the hospitalization.   On the night prior to discharge, she developed acute left shoulder pain.  This was quite severe and on examination she was very tender.  There was  slight warmth in the shoulder.  She has had history of acute  monoarthritis recurrently in the past, most consistent with gout but has  never had a crystal proven diagnosis.  She is started on prednisone 10  mg b.i.d. at discharge as well as colchicine once daily.   DISCHARGE MEDICATIONS:  1. Diovan 160 mg once  daily.  2. Coumadin 5 mg 1-1/2 tablet daily.  3. Coreg 6.25 mg b.i.d.  4. Nortriptyline 25 mg at bedtime.  5. Pravastatin 80 mg daily.  6. Gabapentin 400 mg two t.i.d.  7. Lasix 40 mg daily.  8. Calcium daily.  9. K-Dur 20 mEq b.i.d.  10.Colchicine 0.6 mg daily.  11.Prednisone 10 mg b.i.d. x5 days.   FOLLOW UP:  She has an appointment to see the pharmacist for a Pro[employer]  check in the next few days.  She will see Dr. Amil Amen in a couple weeks  and see me in approximately 1 month.  At that time I will be following  up on her anemia.  Dr. Amil Amen will be following up on her congestive  heart failure and is considering increasing her Coreg.      Theressa Millard, M.D.  Electronically Signed     JO/MEDQ  D:  09/20/2006  T:  09/20/2006  Job:  [id number]

## 2010-11-03 NOTE — Cardiovascular Report (Signed)
NAMECHARNEE, TURNIPSEED              ACCOUNT NO.:  [id number]   MEDICAL RECORD NO.:  [id number]          PATIENT TYPE:  INP   LOCATION:  6529                         FACILITY:  [facility]   PHYSICIAN:  Corky Crafts, MDDATE OF BIRTH:  1933-05-13   DATE OF PROCEDURE:  08/09/2006  DATE OF DISCHARGE:                            CARDIAC CATHETERIZATION   PROCEDURES PERFORMED:  Left heart catheterization, left ventriculogram,  coronary angiogram, abdominal aortogram.   OPERATOR:  Dr. Eldridge Dace.   INDICATION FOR TEST:  Decreased left ventricular systolic function and  abnormal stress test.   PROCEDURE:  The risks and benefits of cardiac catheterization were  explained to the patient and informed consent was obtained.  The patient  was brought to the cath lab.  She was prepped and draped in the usual  sterile fashion.  The right groin was infiltrated with 1% lidocaine.  A  6-French arterial sheath was placed into the right femoral artery using  the modified Seldinger technique and left coronary artery angiography  was performed using a JL-4.0 catheter.  The catheter was advanced to the  vessel ostium under fluoroscopic guidance.  Digital angiography was  performed in multiple projections using hand injection of contrast.  Right coronary artery angiography was then performed using a JR-4.0  catheter.  The catheter was advanced to the vessel ostium under  fluoroscopic guidance.  Digital angiography was performed in multiple  projections using hand injection of contrast.  The left ventriculogram  was then performed using a pigtail catheter.  The catheter was advanced  to the ascending aorta and across the aortic valve under fluoroscopic  guidance.  Power injection of contrast was done on the right in the 30  degree RAO position.  The catheter was pulled back under continuous  hemodynamic pressure monitoring.  The catheter was then withdrawn to the  level of the renal arteries.  Abdominal  aortogram was performed in the  AP projection.  The sheath will be removed using manual compression.   FINDINGS:  The left main was widely patent.  The left circumflex is a  large vessel with luminal irregularities.  OM-1 is a small vessel.  OM-2  is a large vessel with minor irregularities.  OM-3 is a medium-sized  vessel also with minor irregularities.  The left anterior descending was  a large vessel with minor luminal irregularities.  The D-1 and D-3 are  small vessels, the D-2 is a medium-sized vessel with minor  irregularities.  The right coronary artery is a large dominant vessel  with minor luminal irregularities throughout the vessel.  The left  ventriculogram shows severe left ventricular dysfunction.  The estimated  ejection fraction is 25-30%.  The abdominal aortogram shows single renal  arteries bilaterally which appear widely patent.  The infrarenal aorta  appears normal in caliber.  There is no significant atherosclerosis  noted.   HEMODYNAMICS:  Left ventricular pressure 103/11 with an LVEDP of 18  mmHg.  Aortic pressure of 103/58 with a mean aortic pressure of 77 mmHg.   IMPRESSION:  1. No significant coronary artery disease.  2. Severe left ventricular dysfunction  with an estimated ejection      fraction of 25-30%.  3. Mildly increased left ventricular end-diastolic pressure.   RECOMMENDATIONS:  Continue aggressive medical therapy for left  ventricular dysfunction including diuretics, ACE inhibitor and beta  blocker.  The patient will have to be watched overnight.  Her sheath  cannot be removed until the Lovenox wears off.  A closure device was not  used because the patient has a high bifurcation of her superficial  femoral artery and profunda femoral which prohibited the use of a  closure device.      Corky Crafts, MD  Electronically Signed     JSV/MEDQ  D:  08/09/2006  T:  08/09/2006  Job:  [id number]

## 2010-11-03 NOTE — Discharge Summary (Signed)
Bianca [name], Bianca [name]              ACCOUNT NO.:  [id number]   MEDICAL RECORD NO.:  [id number]          PATIENT TYPE:  INP   LOCATION:  3729                         FACILITY:  [facility]   PHYSICIAN:  Francisca December, M.D.  DATE OF BIRTH:  1932/10/27   DATE OF ADMISSION:  08/28/2006  DATE OF DISCHARGE:  09/02/2006                               DISCHARGE SUMMARY   DISCHARGE DIAGNOSES:  1. Acute and chronic left systolic heart failure.  2. Nonischemic cardiomyopathy with ejection fraction of 25%.  3. Locked into Coumadin therapy secondary to #2.  4. Remote history of atrial fibrillation.  5. Hypertension treated.  6. Hypertriglyceridemia treated.  7. Peripheral neuropathy treated.  8. Long term medication use.  9. Iatrogenic dehydration resolved.   HOSPITAL COURSE:  Bianca [name] is a 77 year old female that was admitted  on August 28, 2006, with shortness of breath, chest pressure and lower  extremity edema.   She has a known history of severe LV dysfunction, nonischemic with an EF  around 25%.  She is brought to the emergency room with symptoms  consistent with acute systolic left heart failure.  Chest x-ray showed  mild CHF.  She was then aggressively treated with IV Lasix and educated  on a congestive heart failure, low-sodium (2000 mg) diet.  In  hospitalization she had iatrogenic dehydration and this gradually  resolved once the Lasix was taken away.  She was then discharged to home  in stable condition.   By September 02, 2006 she was ready for discharge to home.  She had no  peripheral edema.  Her lungs were clear and her discharge weight was  91.7 kilograms.   Her medications were omitted from her admission medications secondary to  her dehydration and subsequent low blood pressures.  We will hopefully  increase these when she is back in the office.   DISCHARGE INSTRUCTIONS:  1. She will be on a low-fat, low-sodium, heart healthy diet.  She was      instructed regarding the  diet by nursing staff.  2. She is to increase her activity slowly.  3. She is to record her daily weight as instructed on her discharge      instructions sheet.  4. She is to be followed up in the office within the week.  She is to      call for this appointment.  5. For now she is to stop taking her Lasix and Digoxin.   CURRENT MEDICATIONS:  1. Neurontin 400 mg 2 tablets 3 times a day.  2. Lopid 600 mg b.i.d.  3. Pamelor 25 mg q.h.s.  4. Diovan 160 mg 1/2 tablet daily.  5. Coreg 12.5 mg 1/2 tablet twice a day.  6. Pravachol 40 mg 2 tablets daily.  7. Baby aspirin.  8. Coumadin as prior to admission 5 mg daily.  9. Calcium 1 tablet daily.  10.Multivitamin daily.   The patient was discharged to home in stable and improved condition.      Guy Franco, P.A.      Francisca December, M.D.  Electronically Signed    LB/MEDQ  D:  09/05/2006  T:  09/06/2006  Job:  [id number]   cc:   Francisca December, M.D.  Theressa Millard, M.D.

## 2010-11-03 NOTE — Consult Note (Signed)
NAMEMarland Kitchen  Bianca [name], Bianca [name]              ACCOUNT NO.:  [id number]   MEDICAL RECORD NO.:  [id number]          PATIENT TYPE:  INP   LOCATION:  3702                         FACILITY:  [facility]   PHYSICIAN:  Bianca [name], M.D.  DATE OF BIRTH:  1932/11/23   DATE OF CONSULTATION:  09/16/2006  DATE OF DISCHARGE:                                 CONSULTATION   REASON FOR CONSULTATION:  Apparent heart failure.   HISTORY OF PRESENT ILLNESS:  Bianca [name] is a 77 year old woman who  was diagnosed as having a severe cardiomyopathy in February 2008.  She  was found to have a fixed defect on Cardiolite but subsequent  catheterization showed no fixed coronary disease.  She has been treated  with Coreg in increasing dosage as well as Diovan.  She has not had any  significant diuretic therapy since discharge from the hospital in  February other than about 2 weeks' worth of hydrochlorothiazide.  In mid  March she was readmitted to the hospital with severe worsening CHF.  She  received IV furosemide 40 mg twice daily for 4 days.  This resulted in  resolution of her heart failure, however, she became dehydrated with  hypotension and required several additional days in the hospital with  fluid hydration.  At discharge her BUN was 42 and creatinine 1.5.   She subsequently saw my PA in the office and had Diovan completely  discontinued with a decrease in Coreg to 6.25 twice daily due to  orthostatic hypotension.  This was on September 05, 2006, after discharge on  September 02, 2006.   A subsequent office visit with Dr. Nehemiah [name] on September 10, 2006, for pain in  the left wrist resulted in diagnosis of gout.  She was given colchicine,  Indocin, and prednisone was initiated.  Creatinine was 1.4 at the time.  Two days later she saw a separate PA in my office again with worsening  dyspnea and a 3.6 pound weight gain.  She was taking prednisone and  Indocin at the time.  The PA initiated hydrochlorothiazide at a low dose  as well as potassium.  The following day she was readmitted to [facility] with severe dyspnea and possible pneumonia.  Her creatinine at  the time was almost 3 and BNP was 3000.  At the time of her admission  she denied any cough or fever.  She did have an elevated white count but  was taking prednisone at the time.  Again, she had no significant  diuretic for 2 weeks prior and her ARB had been discontinued.   After hospitalization, she has slowly improved and initial chest x-ray  was reported as showing a right lower lobe infiltrate.  However, there  was also evidence of edema on all three of the x-rays she has had in the  hospital since admission.  There was clearly a mild edema on her x-ray  done this morning.  She has not been given any diuretic due to her  elevated creatinine at admission and in fact for 2 days had received IV  hydration.  She has also been  treated with antibiotics for her presumed  pneumonia.  Her creatinine since admission has fallen to 1.5 and her BUN  from over 100 to around 50 at this time.  She had a single stool on  admission that was positive for blood and none since.  She has been  systemically anticoagulated for approximately 3 weeks.   PAST MEDICAL HISTORY:  Really is limited to what is mentioned above.  She does have a long-standing diagnosis of peripheral neuropathy and a  new diagnosis of gout.   MEDICATIONS ON ADMISSION:  1. Indocin 50 mg p.o. t.i.d.  2. Coreg 6.25 mg p.o. b.i.d.  3. Warfarin adjusted dose.  4. Potassium 20 mEq p.o. daily.  5. Prednisone taper.  6. Neurontin 800 mg p.o. t.i.d.  7. Aspirin 81 mg p.o. daily.  8. Pravastatin 80 mg p.o. daily.  9. Pamelor 25 mg p.o. at bedtime.  10.Hydrochlorothiazide 12.5 mg p.o. daily.  11.Colchicine 0.6 mg p.o. daily.   CURRENT MEDICATIONS IN THE HOSPITAL INCLUDE:  1. Carvedilol 6.25 mg p.o. b.i.d.  2. Gabapentin 800 mg p.o. t.i.d.  3. Avelox 400 mg p.o. daily.  4. Nortriptyline 25 mg  p.o. at bedtime.  5. Prednisone has been discontinued.  6. Simvastatin 40 mg p.o. at bedtime.  7. Adjusted dose warfarin.  8. She has also received intermittent inhaler therapy with improvement      in her shortness of breath.   SOCIAL HISTORY:  No alcohol or tobacco use.  She is married.  Retired.   FAMILY HISTORY:  Noncontributory.   REVIEW OF SYSTEMS:  She currently feels better than on admission and is  able to walk the hallway without excessive dyspnea.  She has been able  tolerate the discontinuation of O2 today.  She denies any constitutional  symptoms but is complaining of abdominal pain today.  Dr. Earl [name] has  ordered abdominal series.   Review of systems otherwise negative.   PHYSICAL EXAMINATION:  VITAL SIGNS:  The blood pressure is 116/79 with  pulse of 73 and regular, respiratory rate 20, temperature 98.4, O2  saturation on room air 92.  GENERAL:  She is a well-appearing, comfortable, pleasant 77 year old  Caucasian woman in no distress.  HEENT:  Is unremarkable.  Head is atraumatic and normocephalic.  The  pupils are equal, round and reactive to light.  Sclerae are anicteric.  Oral mucosa is pink and moist.  Tongue is not coated.  The neck is  supple without thyromegaly or masses.  The carotid upstrokes are normal.  There is no bruit.  There is no jugular venous distension sitting  upright.  CHEST:  Her chest has rales in the right base and wheezes on the left.  There is no dullness to percussion.  Her heart has regular rhythm.  There is an S3 gallop which is soft.  There is no murmur.  ABDOMEN:  The abdomen is diffusely tender but soft.  Positive bowel  sounds are heard.  EXTREMITIES:  The lower extremities show no edema.  NEUROLOGICAL:  Exam is nonfocal.  SKIN:  Warm, dry and clear.   ACCESSORY CLINICAL DATA:  Serum electrolytes today are normal.  BUN is  50, creatinine 1.5.  INR is 1.5.  Most recent white blood cell count today 12,000, no left shift,  hemoglobin is 10.  EKG shows left bundle  branch block in sinus rhythm.  Last BNP was 2832.   IMPRESSION:  1. Decompensated congestive heart failure.  This was not readily  apparent on the admission x-ray but is certainly present today and      is supported by the BNP of almost 3000.  Of note, the patient has      not received any significant diuretic therapy since her      hospitalization in mid March.  She has also been off her      vasodilator therapy.  2. Acute renal failure now resolving.  Most likely due to recent      nonsteroidal anti-inflammatory drugs but poor renal perfusion      secondary to heart failure is almost certainly playing a role.  3. Severe cardiomyopathy, left ventricular ejection fraction 20-30%.      Question ischemic.  No coronary artery disease but fixed defect on      Cardiolite consistent with myocardial infarction.  4. Right lower lobe pneumonia not easily visible to me on x-ray.  5. Leukocytosis on admission likely secondary to steroids.  6. Recent gout flare.  7. History of iatrogenic dehydration as described above.  8. Abdominal pain, question etiology.  9. Systemic anticoagulation.  10.Peripheral neuropathy.Marland Kitchen   PLAN/RECOMMENDATION:  1. I would transfer the patient to 2000 or 3700 and initiate a      milrinone drip to improve vasal dilatation, forward flow, renal      perfusion, reduce edema.  2. Depending on how the patient responds to this, we will likely      initiate relatively low dose diuretic therapy to 20-40 mg p.o. dose      range.  3. Strict I's and O's and daily weights.  4. Likely need to repeat 2-D echocardiogram.  5. With the left bundle branch block and severe LV dysfunction as well      as class III for heart failure symptoms, she is becoming a      candidate for ICD biventricular placement.  Ninety days of medical      therapy are generally recommended prior to initiating this.  6. Would need to see the BNP falling prior to  discharge in the absence      of an increasing creatinine.   Thank you very much for allowing me to assist in care of Bianca [name].  It has been a  pleasure to do so.  I will discuss her further care with  you.      Bianca [name], M.D.  Electronically Signed     JHE/MEDQ  D:  09/16/2006  T:  09/16/2006  Job:  [id number]   cc:   Theressa Millard, M.D.

## 2010-11-03 NOTE — H&P (Signed)
NAME:  Bianca [name], ANGULO NO.:  [id number]   MEDICAL RECORD NO.:  [id number]          PATIENT TYPE:  EMS   LOCATION:  MAJO                         FACILITY:  [facility]   PHYSICIAN:  Hollice Espy, M.D.DATE OF BIRTH:  Nov 17, 1932   DATE OF ADMISSION:  09/13/2006  DATE OF DISCHARGE:                              HISTORY & PHYSICAL   ATTENDING PHYSICIAN/PRIMARY CARE PHYSICIAN:  Dr. Theressa Millard.   CHIEF COMPLAINT:  Shortness of breath.   HISTORY OF PRESENT ILLNESS:  The patient is a 77 year old white female  with past medical history of a recent MI within the last 6 weeks as well  as nonischemic cardiomyopathy with an ejection fraction of 25%, on  chronic anticoagulation therapy, as well as peripheral neuropathy, who  for the last 24-48 hours has been having problems with worsening  shortness of breath and dyspnea on exertion.  She has had no cough  associated with this, productive or otherwise, and has noted no fevers  or chills.  In addition, she has also noted in the last week to have a  flare-up of gout and has been treated with oral prednisone for 2 days  now.  When she came into the emergency room for evaluation, she was  noted to have a slightly elevated white count of 17 with a shift.  Other  labs of note were a BUN of 96 with a creatinine of 2.9; her creatinine  at time of discharge a week ago was 1.7 and her baseline is around 1.3.  Her BNP was found to be slightly elevated at 3025, although this was  likely felt to be secondary to chronic congestive heart failure plus her  elevated BUN and creatinine.  Her chest x-ray actually showed evidence  of an enlarged heart and an early right lower lobe infiltrate.  Currently, the patient states she is feeling a little bit better.  She  is saturating 92% on room air.  She denies any headaches, vision  changes, dysphagia, chest pain or palpitations.  Her shortness of breath  is much improved.  She denies any wheezing  or coughing.  No abdominal  pain.  No hematuria, no dysuria.  No constipation, no diarrhea.  No  focal extremity numbness, weakness or pain other than her chronic  neuropathy.  Her review of systems is otherwise negative.   PAST MEDICAL HISTORY:  1. Recently diagnosed congestive heart failure with an ejection      fraction of 25%.  2. Nonischemic cardiomyopathy.  3. Recent MI.  4. Renal insufficiency, baseline creatinine around 1.3.  5. Peripheral neuropathy.   MEDICATIONS:  1. Indocin 50 mg p.o. t.i.d.  2. Coreg 25 mg p.o. b.i.d.  3. Coumadin 5 mg p.o. nightly, but one time a week she takes 7.5 mg      p.o. nightly.  4. Prednisone taper; she is on day #3 of 30 mg for another 2 more      days, then 20 mg for 2 days, then 10 mg for 2 days, then stop.  5. K-Dur 20 mEq p.o. daily.  6. Neurontin 800 mg p.o.  t.i.d.  7. Aspirin 81 mg p.o. daily.  8. Pravastatin 80 mg p.o. daily.  9. Pamelor 25 mg p.o. nightly.  10.Hydrochlorothiazide 25 mg p.o. daily.  11.Colchicine 0.6 mg p.o. daily.  12.Multivitamin daily.  13.Os-Cal D 600 mg p.o. daily.   ALLERGIES:  She is allergic to AMOXICILLIN and ACE INHIBITORS.   SOCIAL HISTORY:  No tobacco, alcohol or drug use.   FAMILY HISTORY:  Noncontributory.   PHYSICAL EXAMINATION:  VITALS:  On admission, temperature 97, heart rate  81, blood pressure 123/82, respirations 21, O2 SAT on room air, 100% on  2 L.  GENERAL:  The patient is alert and oriented x3, in no apparent distress.  HEENT:  Normocephalic, atraumatic.  Her mucous membranes are moist.  She  has no carotid bruits.  HEART:  Regular rate and rhythm with a 2/6 systolic ejection murmur.  LUNGS:  She has decreased breath sounds at the right base.  ABDOMEN:  Soft, obese and nontender.  Positive bowel sounds.  EXTREMITIES:  No clubbing or cyanosis.  Trace pitting edema.  Peripheral  pulses 1+.   LABORATORY WORK:  White count 17.4 with an 84% shift, H&H 10.6 and 30.3,  MCV of 90,  platelet count 339,000.  Sodium 127, potassium 5.4, chloride  96, bicarb 19, BUN 96, creatinine 2.9, glucose 140.  Albumin is noted at  3.9; the rest of her LFTs are unremarkable.  BNP is elevated at 3025.  Hemoccult is positive.   RADIOLOGIC FINDINGS:  A chest x-ray shows early right lower lobe  infiltrate.   ASSESSMENT AND PLAN:  1. Right lower lobe pneumonia, atypical, given lack of cough,      productive or otherwise.  Nevertheless, given her acute complaints      plus elevated white count with shift, we will start her on      intravenous Avelox, oxygen and breathing treatments.  Likely, if      she is able to start ambulating on room air, we can likely change      her to orals and go home shortly.  2. Acute on chronic renal failure.  We will hold her nonsteroidal anti-      inflammatory drugs as well as her hydrochlorothiazide, gently      hydrate and continue to follow.  3. Hyperkalemia secondary to #2.  Hold potassium.  4. History of congestive heart failure.  Despite her elevated BNP,      this is stable and she is not in an acute heart failure.  5. Nonischemic cardiomyopathy.  Continue Pravachol.  6. Chronic anticoagulation.  Continue Coumadin; her INR is      therapeutic.  7. History of gout.  We will taper off her prednisone rapidly and her      gout is currently stable.      Hollice Espy, M.D.  Electronically Signed     SKK/MEDQ  D:  09/13/2006  T:  09/13/2006  Job:  [id number]   cc:   Theressa Millard, M.D.

## 2010-11-03 NOTE — H&P (Signed)
Bianca [name], Bianca [name]              ACCOUNT NO.:  [id number]   MEDICAL RECORD NO.:  [id number]          PATIENT TYPE:  INP   LOCATION:  4735                         FACILITY:  [facility]   PHYSICIAN:  Michelene Gardener, MD    DATE OF BIRTH:  1932-08-12   DATE OF ADMISSION:  08/08/2006  DATE OF DISCHARGE:                              HISTORY & PHYSICAL   CHIEF COMPLAINT:  Chest pain.   HISTORY OF PRESENT ILLNESS:  This is a 77 year old Caucasian female with  a past medical history of hypertension, hyperlipidemia, and neuropathy,  who presented with the above mentioned complaint.  The patient stated  that today he was standing in front of the sink for a long time after  which she felt light headedness and then she almost passed out.  She  collapsed to the floor.  She did not hit any part of her body.  She  never lost consciousness.  After her near syncope, then she developed  chest pain.  The pain was in the middle of her chest described as  pressure like around 4 to 5 out of 10, not radiating, associated with  sweating.  There is no shortness of breath, there is no nausea, no  vomiting.  Her pain continued for a few minutes and then resolved.  The  patient presented to the ER for further evaluation.   PAST MEDICAL HISTORY:  Significant for:  1. Hypertension.  2. Hypertriglyceridemia.  3. Peripheral neuropathy.  4. History of atrial fibrillation in 1998 and the patient has not had      anymore atrial fibrillation since then.   PAST SURGICAL HISTORY:  1. Status post total abdominal hysterectomy.  2. Status post cholecystectomy.  3. Status post left breast cyst removal.  4. Bladder suspension procedure.   CURRENT MEDICATIONS:  1. Digoxin unknown dose.  2. Gabapentin unknown dose.  3. Nortriptyline unknown dose.  4. Aspirin.  The patient stated that someone will bring her medications tomorrow.  Going back to our previous records, the patient was admitted on September 07, 2003, and at  that time, she was taking the following medications:  1. Accupril unknown dose.  2. Lanoxin 0.125 mg p.o. daily.  3. Cartia XT 240 mg once daily.  4. Neurontin 300 mg three times daily.  5. Tricor 145 mg p.o. once daily.  6. Hydrochlorothiazide 25 mg p.o. once daily.  7. Multi-vitamin one tab p.o. once daily.  8. Calcium carbonate once daily.   ALLERGIES:  The patient is allergic to ACCUPRIL which she developed  angioedema from that.   SOCIAL HISTORY:  The patient is married.  She has three children.  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  She denied smoking.  She  denies alcohol.  She denies recreational drugs.   FAMILY HISTORY:  Significant for hypertension.   REVIEW OF SYSTEMS:  CONSTITUTIONAL:  Positive for fatigability.  EYES:  No blurred vision.  ENT:  No tinnitus.  Positive for dizziness.  CARDIOVASCULAR:  Positive for chest pain, no palpitations, positive for  near syncope.  GI:  No nausea, vomiting,  or diarrhea.  GU:  No dysuria  and no hematuria.  ENDOCRINE:  No polyuria.  HEMATOLOGICAL:  No  bruising, no bleeding.  ID:  No rash, no lesions.  NEUROLOGICAL:  No  numbness or tingling.  Positive for light headedness and dizziness.  The  rest of systems were reviewed and they were negative.   PHYSICAL EXAMINATION:  VITAL SIGNS:  Temperature 97.5, pulse 76,  respiratory rate 20, blood pressure 97/54.  GENERAL:  This is an elderly Caucasian female in no acute distress.  HEENT:  Conjunctivae showed no pallor, no erythema.  Pupils are equal  and reactive to light and accommodation.  There is no ptosis.  There is  no ear discharge.  No nose discharge.  Oral mucosa is dry, no pharyngeal  erythema.  NECK:  Supple, no JVD, no carotid bruit, no lymphadenopathy, no thyroid  enlargement or thyroid tenderness.  CARDIOVASCULAR:  S1 and S2 regular, no murmurs, no gallops, no thrills.  RESPIRATORY:  The patient is breathing 16 to 18.  There is no use of  accessory muscles, no  intercostal retractions.  No rales, no rhonchi, no  wheezes.  ABDOMEN:  The abdomen is soft and nondistended, no hepatosplenomegaly,  bowel sounds normal, umbilicus is central.  EXTREMITIES:  Lower extremities with no edema, no rashes, no varicose  veins.  SKIN:  No rash and no erythema.  NEUROLOGICAL:  Cranial nerves 2-12 are intact.  There is no motor or  sensory deficits.   LABORATORY DATA:  WBC 6.7, hemoglobin 12.9, hematocrit 37.2, MCV 91.7,  platelet count 304.  INR 0.9.  Sodium 138, potassium 4, chloride 101,  bicarb 28, glucose 85, BUN 21, creatinine 1.09, alkaline phos 60, AST  29, ALT 23, calcium 10.1.  EKG showed left bundle branch block.   IMPRESSION AND ASSESSMENT:  1. Chest pain.  This patient has multiple risk factors including      hypertension, hyperlipidemia, and her current EKG is showing left      bundle branch block which is new compared to previous EKG.  I will      admit her to telemetry.  We will get three sets of troponin and      CPK.  I will start her on aspirin and metoprolol.  The patient is      allergic to ACE inhibitors where she got angioedema, so I will      avoid that.  I will put her on sublingual nitro as needed in      addition to Lovenox subcutaneously.  I will also get cardiology      consult for further evaluation.  2. Left bundle branch block.  Management will be as number 1.  3. Hypertension.  Will continue her current medicine and will follow      her blood pressure.  4. Hyperlipidemia.  Will get lipid profile.  5. Near syncope.  Will get echocardiogram and carotid Doppler,      although the way she described it, seems to be vasovagal but acute      changes in the EKG will raise the possibility of cardiac underlying      causes.  6. Neuropathy, will continue Gabapentin.   DISPOSITION:  This patient will be followed by Dr. Theressa Millard  starting from February 22.  The patient does not know the exact dose of her medicine.  Will try to  get the exact doses and then restart her  medications as appropriate.  Michelene Gardener, MD  Electronically Signed     NAE/MEDQ  D:  08/08/2006  T:  08/08/2006  Job:  [id number]   cc:   Theressa Millard, M.D.

## 2010-11-03 NOTE — H&P (Signed)
NAME:  Bianca [name], Bianca [name]                        ACCOUNT NO.:  [id number]   MEDICAL RECORD NO.:  [id number]                   PATIENT TYPE:  EMS   LOCATION:  ED                                   FACILITY:  [facility]   PHYSICIAN:  Hal T. Stoneking, M.D.              DATE OF BIRTH:  July 21, 1932   DATE OF ADMISSION:  09/07/2003  DATE OF DISCHARGE:                                HISTORY & PHYSICAL   IDENTIFYING DATA:  Bianca [name] is a very nice 77 year old white female.  Bianca [name] states she was doing well until about noon today.  She noted  swelling of her tongue, no wheezing.  She did notice some trouble  swallowing.  She states about 3 days ago she had been started on amoxicillin  for a very severe sore throat but also noted at that time that her throat  and her neck felt a little swollen.  Recently she has had no rash, no  itching.  She took Benadryl at home and then came to the emergency room when  she did not feel she was getting any better.  In the emergency room she  received 125 mg of Solu-Medrol and 50 mg of Benadryl, IV fluids, and stated  she was feeling a little better.   PAST MEDICAL HISTORY:  Prior to this she has had no known drug allergies.   CURRENT MEDICATIONS:  1. She is on generic Accupril - unsure of the dose.  2. Recently started on amoxicillin - I assume 500 t.i.d.  3. Lanoxin 0.125 mg daily.  4. Premarin possibly 0.625 mg a day.  5. Cartia XT 240 mg daily.  6. Neurontin 300 mg t.i.d.  7. Tricor 145 mg a day.  8. Hydrochlorothiazide 25 mg a day.  9. Multivitamin once a day.  10.      Calcium carbonate once a day.   PAST MEDICAL HISTORY:  Remarkable for hypertension, hypertriglyceridemia,  peripheral neuropathy, history of atrial fibrillation in 1998 - apparently  on that one time.  No prior history of coronary artery disease, diabetes,  stroke, or cancer.   PREVIOUS SURGERY:  She has had a total abdominal hysterectomy,  cholecystectomy, left breast cyst  removed, and a bladder suspension  procedure.   FAMILY HISTORY:  Remarkable for hypertension.   SOCIAL HISTORY:  She is married, three children.  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  She does not smoke, does not consume alcohol.   REVIEW OF SYSTEMS:  No skin rash, no change in her vision or hearing, no  headache, no shortness of breath, no wheezing, no abdominal pain, no change  in bowel habits.   PHYSICAL EXAMINATION:  VITAL SIGNS:  Temperature 98.3, blood pressure  158/95, pulse rate 70.  HEENT:  Remarkable for enlarged, edematous tongue.  She had sort of a  garbled speech.  NECK:  She had some diffuse swelling in the cervical area.  LUNGS:  Clear.  HEART:  Regular rate and rhythm without murmurs.  ABDOMEN:  Soft. No hepatosplenomegaly or masses palpated.   ASSESSMENT:  Angioedema.  No evidence of airway compromise.  Suspect  Accupril although cannot totally rule out amoxicillin as the cause.   PLAN:  Will admit, observe.  Will discontinue her Accupril and amoxicillin.                                               Hal T. Pete Glatter, M.D.    HTS/MEDQ  D:  09/07/2003  T:  09/08/2003  Job:  [id number]

## 2011-03-30 LAB — PROTIME-INR: INR: 1.6 — ABNORMAL HIGH

## 2011-08-24 ENCOUNTER — Encounter: Payer: Self-pay | Admitting: Internal Medicine

## 2011-08-27 ENCOUNTER — Other Ambulatory Visit: Payer: Self-pay | Admitting: Internal Medicine

## 2011-08-27 DIAGNOSIS — Z1231 Encounter for screening mammogram for malignant neoplasm of breast: Secondary | ICD-10-CM

## 2011-09-12 ENCOUNTER — Ambulatory Visit (INDEPENDENT_AMBULATORY_CARE_PROVIDER_SITE_OTHER): Payer: Medicare Other | Admitting: *Deleted

## 2011-09-12 ENCOUNTER — Encounter: Payer: Self-pay | Admitting: Internal Medicine

## 2011-09-12 ENCOUNTER — Encounter (HOSPITAL_COMMUNITY): Payer: Self-pay

## 2011-09-12 DIAGNOSIS — I509 Heart failure, unspecified: Secondary | ICD-10-CM

## 2011-09-12 DIAGNOSIS — I428 Other cardiomyopathies: Secondary | ICD-10-CM

## 2011-09-12 LAB — ICD DEVICE OBSERVATION
AL AMPLITUDE: 3 mv
AL THRESHOLD: 0.75 V
BATTERY VOLTAGE: 3.1364 V
FVT: 0
LV LEAD IMPEDENCE ICD: 779 Ohm
LV LEAD THRESHOLD: 2 V
RV LEAD THRESHOLD: 0.5 V
TOT-0006: 20120322000000
TZAT-0001SLOWVT: 1
TZAT-0002ATACH: NEGATIVE
TZAT-0002ATACH: NEGATIVE
TZAT-0002ATACH: NEGATIVE
TZAT-0002FASTVT: NEGATIVE
TZAT-0002SLOWVT: NEGATIVE
TZAT-0012ATACH: 150 ms
TZAT-0012SLOWVT: 200 ms
TZAT-0018ATACH: NEGATIVE
TZAT-0018FASTVT: NEGATIVE
TZAT-0018SLOWVT: NEGATIVE
TZAT-0019ATACH: 6 V
TZAT-0019ATACH: 6 V
TZAT-0019FASTVT: 8 V
TZAT-0020ATACH: 1.5 ms
TZAT-0020ATACH: 1.5 ms
TZON-0003VSLOWVT: 370 ms
TZST-0001ATACH: 5
TZST-0001FASTVT: 5
TZST-0001FASTVT: 6
TZST-0001SLOWVT: 4
TZST-0001SLOWVT: 6
TZST-0002ATACH: NEGATIVE
TZST-0002ATACH: NEGATIVE
TZST-0002FASTVT: NEGATIVE
TZST-0002FASTVT: NEGATIVE
TZST-0002FASTVT: NEGATIVE
TZST-0002SLOWVT: NEGATIVE
TZST-0002SLOWVT: NEGATIVE
TZST-0002SLOWVT: NEGATIVE
TZST-0002SLOWVT: NEGATIVE
VENTRICULAR PACING ICD: 99 pct
VF: 0

## 2011-09-12 NOTE — Progress Notes (Signed)
defib check w/icm in clinic  

## 2011-09-13 ENCOUNTER — Ambulatory Visit (HOSPITAL_COMMUNITY)
Admission: RE | Admit: 2011-09-13 | Discharge: 2011-09-13 | Disposition: A | Discharge status: Home / Self Care | Payer: Medicare Other | Source: Ambulatory Visit | Attending: Gastroenterology | Admitting: Gastroenterology

## 2011-09-13 ENCOUNTER — Ambulatory Visit (HOSPITAL_COMMUNITY): Payer: Medicare Other | Admitting: Anesthesiology

## 2011-09-13 ENCOUNTER — Encounter (HOSPITAL_COMMUNITY): Payer: Self-pay | Admitting: Anesthesiology

## 2011-09-13 ENCOUNTER — Encounter (HOSPITAL_COMMUNITY)
Admission: RE | Disposition: A | Discharge status: Home / Self Care | Payer: Self-pay | Source: Ambulatory Visit | Attending: Gastroenterology

## 2011-09-13 ENCOUNTER — Encounter (HOSPITAL_COMMUNITY): Payer: Self-pay | Admitting: *Deleted

## 2011-09-13 DIAGNOSIS — Z860100 Personal history of colon polyps, unspecified: Secondary | ICD-10-CM | POA: Insufficient documentation

## 2011-09-13 DIAGNOSIS — Z7901 Long term (current) use of anticoagulants: Secondary | ICD-10-CM | POA: Insufficient documentation

## 2011-09-13 DIAGNOSIS — Z79899 Other long term (current) drug therapy: Secondary | ICD-10-CM | POA: Insufficient documentation

## 2011-09-13 DIAGNOSIS — G4733 Obstructive sleep apnea (adult) (pediatric): Secondary | ICD-10-CM | POA: Insufficient documentation

## 2011-09-13 DIAGNOSIS — K5289 Other specified noninfective gastroenteritis and colitis: Secondary | ICD-10-CM | POA: Insufficient documentation

## 2011-09-13 DIAGNOSIS — I10 Essential (primary) hypertension: Secondary | ICD-10-CM | POA: Insufficient documentation

## 2011-09-13 DIAGNOSIS — R197 Diarrhea, unspecified: Secondary | ICD-10-CM | POA: Insufficient documentation

## 2011-09-13 HISTORY — DX: Unspecified osteoarthritis, unspecified site: M19.90

## 2011-09-13 HISTORY — DX: Myoneural disorder, unspecified: G70.9

## 2011-09-13 HISTORY — DX: Acute myocardial infarction, unspecified: I21.9

## 2011-09-13 HISTORY — DX: Heart failure, unspecified: I50.9

## 2011-09-13 HISTORY — DX: Essential (primary) hypertension: I10

## 2011-09-13 SURGERY — COLONOSCOPY WITH PROPOFOL
Anesthesia: Monitor Anesthesia Care

## 2011-09-13 MED ORDER — FENTANYL CITRATE 0.05 MG/ML IJ SOLN: 25.0000 ug | INTRAMUSCULAR | Status: DC | PRN

## 2011-09-13 MED ORDER — KETAMINE HCL 10 MG/ML IJ SOLN
INTRAMUSCULAR | Status: DC | PRN
  Administered 2011-09-13 (×2): 20 mg via INTRAVENOUS

## 2011-09-13 MED ORDER — MIDAZOLAM HCL 5 MG/5ML IJ SOLN
INTRAMUSCULAR | Status: DC | PRN
  Administered 2011-09-13: 2 mg via INTRAVENOUS

## 2011-09-13 MED ORDER — LACTATED RINGERS IV SOLN
INTRAVENOUS | Status: DC
  Administered 2011-09-13: 1000 mL via INTRAVENOUS

## 2011-09-13 MED ORDER — PROPOFOL 10 MG/ML IV EMUL
INTRAVENOUS | Status: DC | PRN
  Administered 2011-09-13: 60 ug/kg/min via INTRAVENOUS

## 2011-09-13 MED ORDER — FENTANYL CITRATE 0.05 MG/ML IJ SOLN
INTRAMUSCULAR | Status: DC | PRN
  Administered 2011-09-13 (×2): 100 ug via INTRAVENOUS

## 2011-09-13 SURGICAL SUPPLY — 22 items

## 2011-09-13 NOTE — Anesthesia Preprocedure Evaluation (Addendum)
Anesthesia Evaluation  Patient identified by MRN, date of birth, ID band Patient awake    Reviewed: Allergy & Precautions, H&P , NPO status , Patient's Chart, lab work & pertinent test results, reviewed documented beta blocker date and time   Airway Mallampati: II TM Distance: >3 FB Neck ROM: full    Dental No notable dental hx. (+) Teeth Intact and Dental Advisory Given   Pulmonary neg pulmonary ROS, sleep apnea and Oxygen sleep apnea ,  breath sounds clear to auscultation  Pulmonary exam normal       Cardiovascular Exercise Tolerance: Poor hypertension, Pt. on home beta blockers + Past MI and +CHF negative cardio ROS  + dysrhythmias Atrial Fibrillation + pacemaker + Cardiac Defibrillator Rhythm:regular Rate:Normal  MI 2008   Neuro/Psych negative neurological ROS  negative psych ROS   GI/Hepatic negative GI ROS, Neg liver ROS,   Endo/Other  negative endocrine ROSDiabetes mellitus-, Type 2Diet DM  Renal/GU negative Renal ROS  negative genitourinary   Musculoskeletal   Abdominal   Peds  Hematology negative hematology ROS (+)   Anesthesia Other Findings   Reproductive/Obstetrics negative OB ROS                          Anesthesia Physical Anesthesia Plan  ASA: III  Anesthesia Plan: MAC   Post-op Pain Management:    Induction:   Airway Management Planned: Simple Face Mask  Additional Equipment:   Intra-op Plan:   Post-operative Plan:   Informed Consent: I have reviewed the patients History and Physical, chart, labs and discussed the procedure including the risks, benefits and alternatives for the proposed anesthesia with the patient or authorized representative who has indicated his/her understanding and acceptance.   Dental Advisory Given  Plan Discussed with: CRNA and Surgeon  Anesthesia Plan Comments:         Anesthesia Quick Evaluation

## 2011-09-13 NOTE — Transfer of Care (Signed)
Immediate Anesthesia Transfer of Care Note  Patient: Bianca [name]  Procedure(s) Performed: Procedure(s) (LRB): COLONOSCOPY WITH PROPOFOL (N/A)  Patient Location: PACU  Anesthesia Type: MAC  Level of Consciousness: awake, alert  and oriented  Airway & Oxygen Therapy: Patient Spontanous Breathing and Patient connected to face mask oxygen  Post-op Assessment: Report given to PACU RN and Post -op Vital signs reviewed and stable  Post vital signs: Reviewed and stable  Complications: No apparent anesthesia complications

## 2011-09-13 NOTE — Op Note (Signed)
Procedure: Diagnostic colonoscopy with random colon biopsies.  Indication: The patient is a 78 year old female with unexplained chronic diarrhea and a history of neoplastic colon polyps.  Endoscopist: Danise Edge  Premedication: Propofol administered by anesthesia  Procedure: The patient was placed in the left lateral decubitus position. Anal inspection and digital rectal exam were normal. The Pentax pediatric colonoscope was introduced into the rectum and with difficulty but to colonic loop formation advanced to the cecum. A normal-appearing ileocecal valve and appendiceal orifice were identified. Colonic preparation for the exam today was good.  Rectum. Normal.  Sigmoid colon. Colonic diverticulosis.  Ascending colon. Normal.  Splenic flexure. Normal.  Transverse colon. Normal.  Hepatic flexure. Normal.  Descending colon. Normal.  Cecum and ileocecal valve. Normal.  Random colon biopsies. Random colon biopsies were taken from the right colon and from the left colon to look for microscopic colitis.  Assessment: Normal proctocolonoscopy to the cecum. Random colon biopsies to look for microscopic colitis pending.  Procedure complication. The patient developed apnea with a fall in her oxygen saturation. Anesthesia was discontinued. The patient responded to mask bagging. She required no further anesthesia to complete the examination. At the conclusion of the procedure the patient was alert and responding to questions.

## 2011-09-13 NOTE — Discharge Instructions (Addendum)
Colonoscopy Care After Read the instructions outlined below and refer to this sheet in the next few weeks. These discharge instructions provide you with general information on caring for yourself after you leave the hospital. Your doctor may also give you specific instructions. While your treatment has been planned according to the most current medical practices available, unavoidable complications occasionally occur. If you have any problems or questions after discharge, call your doctor. HOME CARE INSTRUCTIONS ACTIVITY:  You may resume your regular activity, but move at a slower pace for the next 24 hours.   Take frequent rest periods for the next 24 hours.   Walking will help get rid of the air and reduce the bloated feeling in your belly (abdomen).   No driving for 24 hours (because of the medicine (anesthesia) used during the test).   You may shower.   Do not sign any important legal documents or operate any machinery for 24 hours (because of the anesthesia used during the test).  NUTRITION:  Drink plenty of fluids.   You may resume your normal diet as instructed by your doctor.   Begin with a light meal and progress to your normal diet. Heavy or fried foods are harder to digest and may make you feel sick to your stomach (nauseated).   Avoid alcoholic beverages for 24 hours or as instructed.  MEDICATIONS:  You may resume your normal medications unless your doctor tells you otherwise.  WHAT TO EXPECT TODAY:  Some feelings of bloating in the abdomen.   Passage of more gas than usual.   Spotting of blood in your stool or on the toilet paper.  IF YOU HAD POLYPS REMOVED DURING THE COLONOSCOPY:  No aspirin products for 7 days or as instructed.   No alcohol for 7 days or as instructed.   Eat a soft diet for the next 24 hours.  FINDING OUT THE RESULTS OF YOUR TEST Not all test results are available during your visit. If your test results are not back during the visit, make an  appointment with your caregiver to find out the results. Do not assume everything is normal if you have not heard from your caregiver or the medical facility. It is important for you to follow up on all of your test results.  SEEK IMMEDIATE MEDICAL CARE IF:  You have more than a spotting of blood in your stool.   Your belly is swollen (abdominal distention).   You are nauseated or vomiting.   You have a fever.   You have abdominal pain or discomfort that is severe or gets worse throughout the day.  Document Released: 01/17/2004 Document Revised: 05/24/2011 Document Reviewed: 01/15/2008 Exit[employer] Patient Information 2012 [employer]. 

## 2011-09-13 NOTE — H&P (Signed)
Problem: Chronic diarrhea and history of neoplastic colon polyps.  History: Ms. Bianca [name] is a 78 year old female born 1932/12/09. The patient takes Coumadin chronically due to chronic atrial fibrillation. In 2006, the patient underwent a surveillance colonoscopy with removal of colon polyps. She has obstructive sleep apnea syndrome.  The patient developed diarrhea while on Zithromax. Despite stopping Zithromax her diarrhea has persisted for approximately 6 weeks. Stool culture was negative. Stool screen for C. difficile toxin was negative. The patient has had episodes of fecal incontinence.  The patient is scheduled to undergo a surveillance colonoscopy with random colon biopsies to look for microscopic colitis. She stop taking Coumadin approximately 5 days ago.  Medication allergies: Neosporin. Accupril.  Chronic medications: Calcium. Vitamin D. Lopid. Pravastatin. Spironolactone. Furosemide. Carvedilol. Fish oil. [place]. Nortriptyline. Allopurinol. Gabapentin. Coumadin.  Past medical and surgical history: Chronic atrial fibrillation. Obstructive sleep apnea syndrome. Tophaceous gout. Hypertension. Irritable bowel syndrome. Axonal neuropathy. Hyperlipidemia. Osteoarthritis. Non- ischemic cardiomyopathy. History of adenomatous colon polyps. Stage III kidney disease. Cataract surgery. Total abdominal hysterectomy. Cholecystectomy. Bladder tacking. Tonsillectomy. Arthroscopic left knee surgery. Pacemaker placement. Cardiac defibrillator placement.  The patient quit smoking cigarettes in 1960. She does not consume alcohol.  Exam: Patient is alert and lying comfortably on the endoscopy stricture. Sclera are nonicteric. Mouth and throat appear normal. Lungs are clear to auscultation. Cardiac exam reveals an irregular rhythm consistent with atrial fibrillation. Abdomen is soft, flat, and nontender to palpation in all quadrants.  Plan: Proceed with diagnostic colonoscopy with random colon  biopsies to look for microscopic colitis.

## 2011-09-13 NOTE — Anesthesia Postprocedure Evaluation (Signed)
  Anesthesia Post-op Note  Patient: Bianca [name]  Procedure(s) Performed: Procedure(s) (LRB): COLONOSCOPY WITH PROPOFOL (N/A)  Patient Location: PACU  Anesthesia Type: General  Level of Consciousness: awake and alert   Airway and Oxygen Therapy: Patient Spontanous Breathing  Post-op Pain: mild  Post-op Assessment: Post-op Vital signs reviewed, Patient's Cardiovascular Status Stable, Respiratory Function Stable, Patent Airway and No signs of Nausea or vomiting  Post-op Vital Signs: stable  Complications: No apparent anesthesia complications

## 2011-09-14 NOTE — OR Nursing (Signed)
Late Entry   During colonoscopy under MAC anesthesia on 09/13/2011, the patient developed apnea with a fall in her oxygen saturation. Procedure was stopped, the scope was withdrawn, and  Anesthesia was discontinued. The patient responded to mask bagging and LMA placement per anesthesia staff. Rapid response was called in error. Rapid response arrived, was notified she was not needed and she left moments later. Once patient was beathing on her own, Dr. Laural Benes decided to continue procedure with anesthesia approval.

## 2011-10-02 ENCOUNTER — Ambulatory Visit
Admission: RE | Admit: 2011-10-02 | Discharge: 2011-10-02 | Disposition: A | Discharge status: Home / Self Care | Payer: Medicare Other | Source: Ambulatory Visit | Attending: Internal Medicine | Admitting: Internal Medicine

## 2011-10-02 DIAGNOSIS — Z1231 Encounter for screening mammogram for malignant neoplasm of breast: Secondary | ICD-10-CM

## 2011-12-17 ENCOUNTER — Encounter: Payer: Self-pay | Admitting: Internal Medicine

## 2011-12-17 ENCOUNTER — Ambulatory Visit (INDEPENDENT_AMBULATORY_CARE_PROVIDER_SITE_OTHER): Payer: Medicare Other | Admitting: Internal Medicine

## 2011-12-17 VITALS — BP 104/66 | HR 71 | Ht 66.0 in | Wt 254.0 lb

## 2011-12-17 DIAGNOSIS — Z95810 Presence of automatic (implantable) cardiac defibrillator: Secondary | ICD-10-CM

## 2011-12-17 DIAGNOSIS — I509 Heart failure, unspecified: Secondary | ICD-10-CM

## 2011-12-17 LAB — ICD DEVICE OBSERVATION
AL THRESHOLD: 0.875 V
BAMS-0001: 170 {beats}/min
BATTERY VOLTAGE: 3.15 V
FVT: 0
LV LEAD THRESHOLD: 1.875 V
PACEART VT: 0
RV LEAD THRESHOLD: 0.375 V
TZAT-0001ATACH: 1
TZAT-0001ATACH: 3
TZAT-0001SLOWVT: 1
TZAT-0002ATACH: NEGATIVE
TZAT-0002FASTVT: NEGATIVE
TZAT-0002SLOWVT: NEGATIVE
TZAT-0012ATACH: 150 ms
TZAT-0012ATACH: 150 ms
TZAT-0012SLOWVT: 200 ms
TZAT-0018ATACH: NEGATIVE
TZAT-0018FASTVT: NEGATIVE
TZAT-0018SLOWVT: NEGATIVE
TZAT-0019FASTVT: 8 V
TZAT-0020ATACH: 1.5 ms
TZAT-0020FASTVT: 1.5 ms
TZON-0003ATACH: 350 ms
TZON-0004SLOWVT: 16
TZST-0001ATACH: 4
TZST-0001ATACH: 6
TZST-0001FASTVT: 6
TZST-0001SLOWVT: 2
TZST-0001SLOWVT: 4
TZST-0001SLOWVT: 6
TZST-0002ATACH: NEGATIVE
TZST-0002ATACH: NEGATIVE
TZST-0002FASTVT: NEGATIVE
TZST-0002FASTVT: NEGATIVE
TZST-0002FASTVT: NEGATIVE
TZST-0002SLOWVT: NEGATIVE
TZST-0002SLOWVT: NEGATIVE
VENTRICULAR PACING ICD: 99.42 pct
VF: 0

## 2011-12-17 NOTE — Patient Instructions (Addendum)
Your physician recommends that you continue on your current medications as directed. Please refer to the Current Medication list given to you today.  Your physician wants you to follow-up in: 1 year with Dr. Taylor.  You will receive a reminder letter in the mail two months in advance. If you don't receive a letter, please call our office to schedule the follow-up appointment.  

## 2011-12-17 NOTE — Assessment & Plan Note (Signed)
Her symptoms are class II. I've encouraged the patient to increase her physical activity. She will continue her current medical therapy and maintain a low-sodium diet.

## 2011-12-17 NOTE — Assessment & Plan Note (Signed)
Her device is working normally. We'll plan to recheck in several months. I've noted a marked reduction in patient activity

## 2011-12-17 NOTE — Progress Notes (Signed)
HPI Mrs. Sarnowski returns today for followup. She is a very pleasant 78 year old woman with nonischemic cardiomyopathy, chronic systolic heart failure, left bundle branch block. She is status post biventricular ICD implantation. She denies chest pain. Her heart failure is class II. This is despite morbid obesity. She is sedentary. She has had no ICD shocks and denies chest pain or shortness of breath. Minimal peripheral edema. Allergies  Allergen Reactions  . Neosporin (Neomycin-Polymyxin-Gramicidin)   . Quinapril Hcl      Current Outpatient Prescriptions  Medication Sig Dispense Refill  . allopurinol (ZYLOPRIM) 300 MG tablet 300 mg. Take 400 mg daily      . budesonide (ENTOCORT EC) 3 MG 24 hr capsule Take 3 mg by mouth every morning.      . calcium-vitamin D (OSCAL-500) 500-400 MG-UNIT per tablet Take 1 tablet by mouth daily.      . carvedilol (COREG) 12.5 MG tablet Take 12.5 mg by mouth 2 (two) times daily with a meal.      . Cholecalciferol (VITAMIN D3) 1000 UNITS CAPS Take 1 capsule by mouth daily.      . furosemide (LASIX) 40 MG tablet Take 20 mg by mouth daily.      Marland Kitchen gabapentin (NEURONTIN) 800 MG tablet Take 800 mg by mouth 4 (four) times daily.      Marland Kitchen gemfibrozil (LOPID) 600 MG tablet Take 600 mg by mouth 2 (two) times daily before a meal.      . losartan (COZAAR) 100 MG tablet Take 100 mg by mouth daily.      . Multiple Vitamin (MULTIVITAMIN) tablet Take 1 tablet by mouth daily.      . nortriptyline (PAMELOR) 25 MG capsule Take 25 mg by mouth at bedtime.      . pravastatin (PRAVACHOL) 40 MG tablet Take 80 mg by mouth daily.      Marland Kitchen spironolactone (ALDACTONE) 25 MG tablet Take 25 mg by mouth daily.      Marland Kitchen warfarin (COUMADIN) 5 MG tablet Take 5 mg by mouth as directed.         Past Medical History  Diagnosis Date  . Pacemaker   . ICD (implantable cardiac defibrillator) in place   . Myocardial infarction 2008  . Sleep apnea   . Arthritis   . Neuromuscular disorder   . CHF  (congestive heart failure)   . Hypertension   . Dysrhythmia     ROS:   All systems reviewed and negative except as noted in the HPI.   Past Surgical History  Procedure Date  . Cholecystectomy   . Abdominal hysterectomy   . Ankle surgery   . Insert / replace / remove pacemaker 2008  . Knee ligament reconstruction 2012  . Tonsillectomy   . Cardiac catheterization   . Eye surgery      No family history on file.   History   Social History  . Marital Status: Married    Spouse Name: N/A    Number of Children: N/A  . Years of Education: N/A   Occupational History  . Not on file.   Social History Main Topics  . Smoking status: Never Smoker   . Smokeless tobacco: Not on file  . Alcohol Use: No  . Drug Use: No  . Sexually Active: Not Currently   Other Topics Concern  . Not on file   Social History Narrative  . No narrative on file     BP 104/66  Pulse 71  Ht 5\' 6"  (1.676  m)  Wt 254 lb (115.214 kg)  BMI 41.00 kg/m2  Physical Exam:  Well appearing 78 year old woman, NAD HEENT: Unremarkable Neck:  No JVD, no thyromegally Lungs:  Clear with no wheezes, rales, or rhonchi. HEART:  Regular rate rhythm, no murmurs, no rubs, no clicks Abd:  soft, positive bowel sounds, no organomegally, no rebound, no guarding Ext:  2 plus pulses, no edema, no cyanosis, no clubbing Skin:  No rashes no nodules Neuro:  CN II through XII intact, motor grossly intact  DEVICE  Normal device function.  See PaceArt for details.   Assess/Plan:

## 2012-03-24 ENCOUNTER — Encounter: Payer: Medicare Other | Admitting: *Deleted

## 2012-04-02 ENCOUNTER — Encounter: Payer: Self-pay | Admitting: *Deleted

## 2012-04-09 ENCOUNTER — Telehealth: Payer: Self-pay | Admitting: Internal Medicine

## 2012-04-09 NOTE — Telephone Encounter (Signed)
Pt needs help with sending a transmission

## 2012-04-09 NOTE — Telephone Encounter (Signed)
Spoke w/pt in regards to sending transmissions. Instructions were given to pt on how to send. Pt to try and send transmission and call back once light goes off. Pt understands.

## 2012-04-24 ENCOUNTER — Encounter: Payer: Self-pay | Admitting: Internal Medicine

## 2012-04-24 ENCOUNTER — Ambulatory Visit (INDEPENDENT_AMBULATORY_CARE_PROVIDER_SITE_OTHER): Payer: Medicare Other | Admitting: *Deleted

## 2012-04-24 DIAGNOSIS — Z95810 Presence of automatic (implantable) cardiac defibrillator: Secondary | ICD-10-CM

## 2012-04-24 DIAGNOSIS — I509 Heart failure, unspecified: Secondary | ICD-10-CM

## 2012-04-25 ENCOUNTER — Telehealth: Payer: Self-pay | Admitting: Internal Medicine

## 2012-04-25 NOTE — Telephone Encounter (Signed)
Left message for patient, transmission received. 

## 2012-04-25 NOTE — Telephone Encounter (Signed)
Pt did a transmission last night and wants to see if we got it, pls call

## 2012-04-28 LAB — REMOTE ICD DEVICE
AL IMPEDENCE ICD: 513 Ohm
ATRIAL PACING ICD: 2.67 pct
BAMS-0001: 170 {beats}/min
CHARGE TIME: 9.689 s
LV LEAD IMPEDENCE ICD: 779 Ohm
PACEART VT: 0
RV LEAD THRESHOLD: 0.375 V
TOT-0002: 0
TOT-0006: 20120322000000
TZAT-0001ATACH: 2
TZAT-0001ATACH: 3
TZAT-0001FASTVT: 1
TZAT-0001SLOWVT: 1
TZAT-0002ATACH: NEGATIVE
TZAT-0002FASTVT: NEGATIVE
TZAT-0012ATACH: 150 ms
TZAT-0012ATACH: 150 ms
TZAT-0012ATACH: 150 ms
TZAT-0012SLOWVT: 200 ms
TZAT-0018ATACH: NEGATIVE
TZAT-0018ATACH: NEGATIVE
TZAT-0018SLOWVT: NEGATIVE
TZAT-0019ATACH: 6 V
TZAT-0019FASTVT: 8 V
TZAT-0019SLOWVT: 8 V
TZAT-0020ATACH: 1.5 ms
TZAT-0020ATACH: 1.5 ms
TZAT-0020SLOWVT: 1.5 ms
TZON-0003SLOWVT: 360 ms
TZON-0003VSLOWVT: 370 ms
TZON-0004SLOWVT: 16
TZON-0004VSLOWVT: 28
TZON-0005SLOWVT: 12
TZST-0001ATACH: 4
TZST-0001ATACH: 6
TZST-0001FASTVT: 2
TZST-0001FASTVT: 3
TZST-0001SLOWVT: 2
TZST-0001SLOWVT: 4
TZST-0001SLOWVT: 6
TZST-0002ATACH: NEGATIVE
TZST-0002ATACH: NEGATIVE
TZST-0002ATACH: NEGATIVE
TZST-0002FASTVT: NEGATIVE
TZST-0002FASTVT: NEGATIVE
TZST-0002FASTVT: NEGATIVE
TZST-0002SLOWVT: NEGATIVE
TZST-0002SLOWVT: NEGATIVE
TZST-0002SLOWVT: NEGATIVE
VENTRICULAR PACING ICD: 98.39 pct

## 2012-05-06 ENCOUNTER — Encounter: Payer: Self-pay | Admitting: *Deleted

## 2012-07-28 ENCOUNTER — Ambulatory Visit (INDEPENDENT_AMBULATORY_CARE_PROVIDER_SITE_OTHER): Payer: Medicare Other | Admitting: *Deleted

## 2012-07-28 ENCOUNTER — Encounter: Payer: Self-pay | Admitting: Internal Medicine

## 2012-07-28 ENCOUNTER — Other Ambulatory Visit: Payer: Self-pay | Admitting: Internal Medicine

## 2012-07-28 DIAGNOSIS — I509 Heart failure, unspecified: Secondary | ICD-10-CM

## 2012-07-28 DIAGNOSIS — Z95810 Presence of automatic (implantable) cardiac defibrillator: Secondary | ICD-10-CM

## 2012-07-31 LAB — REMOTE ICD DEVICE
AL IMPEDENCE ICD: 513 Ohm
BAMS-0001: 170 {beats}/min
CHARGE TIME: 9.689 s
LV LEAD IMPEDENCE ICD: 760 Ohm
PACEART VT: 0
TOT-0001: 1
TOT-0002: 0
TOT-0006: 20120322000000
TZAT-0001ATACH: 3
TZAT-0001FASTVT: 1
TZAT-0002FASTVT: NEGATIVE
TZAT-0012ATACH: 150 ms
TZAT-0012ATACH: 150 ms
TZAT-0012SLOWVT: 200 ms
TZAT-0019ATACH: 6 V
TZAT-0020ATACH: 1.5 ms
TZAT-0020ATACH: 1.5 ms
TZAT-0020SLOWVT: 1.5 ms
TZON-0003SLOWVT: 360 ms
TZON-0003VSLOWVT: 370 ms
TZON-0004SLOWVT: 16
TZON-0005SLOWVT: 12
TZST-0001ATACH: 6
TZST-0001FASTVT: 2
TZST-0001FASTVT: 3
TZST-0001FASTVT: 4
TZST-0001FASTVT: 6
TZST-0001SLOWVT: 2
TZST-0002ATACH: NEGATIVE
TZST-0002FASTVT: NEGATIVE
TZST-0002FASTVT: NEGATIVE
TZST-0002SLOWVT: NEGATIVE
TZST-0002SLOWVT: NEGATIVE
TZST-0002SLOWVT: NEGATIVE
VENTRICULAR PACING ICD: 94.69 pct

## 2012-08-20 ENCOUNTER — Encounter: Payer: Self-pay | Admitting: *Deleted

## 2012-08-25 ENCOUNTER — Other Ambulatory Visit: Payer: Self-pay

## 2012-08-25 DIAGNOSIS — Z1231 Encounter for screening mammogram for malignant neoplasm of breast: Secondary | ICD-10-CM

## 2012-10-02 ENCOUNTER — Ambulatory Visit: Payer: Medicare Other

## 2012-10-08 ENCOUNTER — Ambulatory Visit
Admission: RE | Admit: 2012-10-08 | Discharge: 2012-10-08 | Disposition: A | Discharge status: Home / Self Care | Payer: Medicare Other | Source: Ambulatory Visit

## 2012-10-08 DIAGNOSIS — Z1231 Encounter for screening mammogram for malignant neoplasm of breast: Secondary | ICD-10-CM

## 2012-11-21 ENCOUNTER — Inpatient Hospital Stay (HOSPITAL_COMMUNITY)
Admission: EM | Admit: 2012-11-21 | Discharge: 2012-11-24 | DRG: 292 | Disposition: A | Discharge status: Home Health | Payer: Medicare Other | Attending: Interventional Cardiology | Admitting: Interventional Cardiology

## 2012-11-21 ENCOUNTER — Encounter (HOSPITAL_COMMUNITY): Payer: Self-pay

## 2012-11-21 ENCOUNTER — Emergency Department (HOSPITAL_COMMUNITY): Payer: Medicare Other

## 2012-11-21 DIAGNOSIS — R61 Generalized hyperhidrosis: Secondary | ICD-10-CM

## 2012-11-21 DIAGNOSIS — I5023 Acute on chronic systolic (congestive) heart failure: Principal | ICD-10-CM | POA: Diagnosis present

## 2012-11-21 DIAGNOSIS — G4730 Sleep apnea, unspecified: Secondary | ICD-10-CM | POA: Diagnosis present

## 2012-11-21 DIAGNOSIS — I5021 Acute systolic (congestive) heart failure: Secondary | ICD-10-CM

## 2012-11-21 DIAGNOSIS — Z95810 Presence of automatic (implantable) cardiac defibrillator: Secondary | ICD-10-CM

## 2012-11-21 DIAGNOSIS — I5033 Acute on chronic diastolic (congestive) heart failure: Secondary | ICD-10-CM

## 2012-11-21 DIAGNOSIS — R110 Nausea: Secondary | ICD-10-CM

## 2012-11-21 DIAGNOSIS — G609 Hereditary and idiopathic neuropathy, unspecified: Secondary | ICD-10-CM | POA: Diagnosis present

## 2012-11-21 DIAGNOSIS — R269 Unspecified abnormalities of gait and mobility: Secondary | ICD-10-CM | POA: Diagnosis present

## 2012-11-21 DIAGNOSIS — G709 Myoneural disorder, unspecified: Secondary | ICD-10-CM | POA: Diagnosis present

## 2012-11-21 DIAGNOSIS — I252 Old myocardial infarction: Secondary | ICD-10-CM

## 2012-11-21 DIAGNOSIS — Z87891 Personal history of nicotine dependence: Secondary | ICD-10-CM

## 2012-11-21 DIAGNOSIS — I10 Essential (primary) hypertension: Secondary | ICD-10-CM | POA: Diagnosis present

## 2012-11-21 DIAGNOSIS — R0602 Shortness of breath: Secondary | ICD-10-CM

## 2012-11-21 DIAGNOSIS — Z6838 Body mass index (BMI) 38.0-38.9, adult: Secondary | ICD-10-CM

## 2012-11-21 DIAGNOSIS — R079 Chest pain, unspecified: Secondary | ICD-10-CM

## 2012-11-21 DIAGNOSIS — Z7901 Long term (current) use of anticoagulants: Secondary | ICD-10-CM

## 2012-11-21 DIAGNOSIS — I509 Heart failure, unspecified: Secondary | ICD-10-CM | POA: Diagnosis present

## 2012-11-21 DIAGNOSIS — I4891 Unspecified atrial fibrillation: Secondary | ICD-10-CM | POA: Diagnosis present

## 2012-11-21 DIAGNOSIS — Z79899 Other long term (current) drug therapy: Secondary | ICD-10-CM

## 2012-11-21 DIAGNOSIS — E669 Obesity, unspecified: Secondary | ICD-10-CM | POA: Diagnosis present

## 2012-11-21 DIAGNOSIS — M129 Arthropathy, unspecified: Secondary | ICD-10-CM | POA: Diagnosis present

## 2012-11-21 DIAGNOSIS — I428 Other cardiomyopathies: Secondary | ICD-10-CM | POA: Diagnosis present

## 2012-11-21 DIAGNOSIS — N390 Urinary tract infection, site not specified: Secondary | ICD-10-CM | POA: Diagnosis present

## 2012-11-21 DIAGNOSIS — I447 Left bundle-branch block, unspecified: Secondary | ICD-10-CM | POA: Diagnosis present

## 2012-11-21 HISTORY — DX: Chronic systolic (congestive) heart failure: I50.22

## 2012-11-21 LAB — URINALYSIS, ROUTINE W REFLEX MICROSCOPIC
Bilirubin Urine: NEGATIVE
Hgb urine dipstick: NEGATIVE
Ketones, ur: NEGATIVE mg/dL
Protein, ur: NEGATIVE mg/dL
Urobilinogen, UA: 0.2 mg/dL (ref 0.0–1.0)

## 2012-11-21 LAB — CBC WITH DIFFERENTIAL/PLATELET
Basophils Absolute: 0 10*3/uL (ref 0.0–0.1)
Basophils Relative: 0 % (ref 0–1)
Eosinophils Absolute: 0.5 10*3/uL (ref 0.0–0.7)
Eosinophils Relative: 5 % (ref 0–5)
HCT: 39.3 % (ref 36.0–46.0)
MCHC: 33.3 g/dL (ref 30.0–36.0)
MCV: 95.6 fL (ref 78.0–100.0)
Monocytes Absolute: 0.8 10*3/uL (ref 0.1–1.0)
Platelets: 233 10*3/uL (ref 150–400)
RDW: 14.2 % (ref 11.5–15.5)

## 2012-11-21 LAB — BASIC METABOLIC PANEL
BUN: 27 mg/dL — ABNORMAL HIGH (ref 6–23)
CO2: 31 mEq/L (ref 19–32)
Calcium: 10 mg/dL (ref 8.4–10.5)
Chloride: 99 mEq/L (ref 96–112)
Creatinine, Ser: 1.12 mg/dL — ABNORMAL HIGH (ref 0.50–1.10)

## 2012-11-21 LAB — PROTIME-INR
INR: 2.03 — ABNORMAL HIGH (ref 0.00–1.49)
Prothrombin Time: 22.1 seconds — ABNORMAL HIGH (ref 11.6–15.2)

## 2012-11-21 LAB — URINE MICROSCOPIC-ADD ON

## 2012-11-21 LAB — PRO B NATRIURETIC PEPTIDE: Pro B Natriuretic peptide (BNP): 2083 pg/mL — ABNORMAL HIGH (ref 0–450)

## 2012-11-21 LAB — POCT I-STAT TROPONIN I: Troponin i, poc: 0.03 ng/mL (ref 0.00–0.08)

## 2012-11-21 MED ORDER — SODIUM CHLORIDE 0.9 % IV SOLN: 250.0000 mL | INTRAVENOUS | Status: DC | PRN

## 2012-11-21 MED ORDER — ADULT MULTIVITAMIN W/MINERALS CH
1.0000 | ORAL_TABLET | Freq: Every day | ORAL | Status: DC
  Administered 2012-11-22 – 2012-11-24 (×3): 1 via ORAL
  Filled 2012-11-21 (×3): qty 1

## 2012-11-21 MED ORDER — ONE-DAILY MULTI VITAMINS PO TABS: 1.0000 | ORAL_TABLET | Freq: Every day | ORAL | Status: DC

## 2012-11-21 MED ORDER — CARVEDILOL 6.25 MG PO TABS
6.2500 mg | ORAL_TABLET | Freq: Two times a day (BID) | ORAL | Status: DC
  Administered 2012-11-22 – 2012-11-24 (×5): 6.25 mg via ORAL
  Filled 2012-11-21 (×7): qty 1

## 2012-11-21 MED ORDER — DEXTROSE 5 % IV SOLN: 1.0000 g | Freq: Once | INTRAVENOUS | Status: DC

## 2012-11-21 MED ORDER — GEMFIBROZIL 600 MG PO TABS
600.0000 mg | ORAL_TABLET | Freq: Two times a day (BID) | ORAL | Status: DC
  Administered 2012-11-22 – 2012-11-24 (×5): 600 mg via ORAL
  Filled 2012-11-21 (×7): qty 1

## 2012-11-21 MED ORDER — VITAMIN D3 25 MCG (1000 UNIT) PO TABS
1000.0000 [IU] | ORAL_TABLET | Freq: Every day | ORAL | Status: DC
  Administered 2012-11-22 – 2012-11-24 (×3): 1000 [IU] via ORAL
  Filled 2012-11-21 (×3): qty 1

## 2012-11-21 MED ORDER — ONDANSETRON HCL 4 MG/2ML IJ SOLN: 4.0000 mg | Freq: Four times a day (QID) | INTRAMUSCULAR | Status: DC | PRN

## 2012-11-21 MED ORDER — CALCIUM CARBONATE-VITAMIN D 600-400 MG-UNIT PO TABS: 1.0000 | ORAL_TABLET | Freq: Two times a day (BID) | ORAL | Status: DC

## 2012-11-21 MED ORDER — SPIRONOLACTONE 25 MG PO TABS
25.0000 mg | ORAL_TABLET | Freq: Every day | ORAL | Status: DC
  Administered 2012-11-22 – 2012-11-24 (×3): 25 mg via ORAL
  Filled 2012-11-21 (×3): qty 1

## 2012-11-21 MED ORDER — CALCIUM CARBONATE-VITAMIN D 500-200 MG-UNIT PO TABS
1.0000 | ORAL_TABLET | Freq: Two times a day (BID) | ORAL | Status: DC
  Administered 2012-11-22 – 2012-11-24 (×5): 1 via ORAL
  Filled 2012-11-21 (×6): qty 1

## 2012-11-21 MED ORDER — SIMVASTATIN 20 MG PO TABS
20.0000 mg | ORAL_TABLET | Freq: Every day | ORAL | Status: DC
  Filled 2012-11-21: qty 1

## 2012-11-21 MED ORDER — WARFARIN - PHYSICIAN DOSING INPATIENT: Freq: Every day | Status: DC

## 2012-11-21 MED ORDER — VITAMIN D3 25 MCG (1000 UT) PO CAPS: 1.0000 | ORAL_CAPSULE | Freq: Every day | ORAL | Status: DC

## 2012-11-21 MED ORDER — WARFARIN SODIUM 5 MG PO TABS
5.0000 mg | ORAL_TABLET | Freq: Every day | ORAL | Status: DC
  Filled 2012-11-21: qty 1

## 2012-11-21 MED ORDER — ALLOPURINOL 300 MG PO TABS
300.0000 mg | ORAL_TABLET | Freq: Every day | ORAL | Status: DC
  Administered 2012-11-22 – 2012-11-24 (×3): 300 mg via ORAL
  Filled 2012-11-21 (×3): qty 1

## 2012-11-21 MED ORDER — GABAPENTIN 800 MG PO TABS
800.0000 mg | ORAL_TABLET | Freq: Three times a day (TID) | ORAL | Status: DC
  Filled 2012-11-21: qty 1

## 2012-11-21 MED ORDER — ALLOPURINOL 100 MG PO TABS
100.0000 mg | ORAL_TABLET | Freq: Every day | ORAL | Status: DC
  Administered 2012-11-22 – 2012-11-24 (×3): 100 mg via ORAL
  Filled 2012-11-21 (×3): qty 1

## 2012-11-21 MED ORDER — FUROSEMIDE 10 MG/ML IJ SOLN
40.0000 mg | Freq: Once | INTRAMUSCULAR | Status: AC
  Administered 2012-11-22: 40 mg via INTRAVENOUS
  Filled 2012-11-21: qty 4

## 2012-11-21 MED ORDER — NITROGLYCERIN 0.4 MG SL SUBL: 0.4000 mg | SUBLINGUAL_TABLET | SUBLINGUAL | Status: DC | PRN

## 2012-11-21 MED ORDER — DEXTROSE 5 % IV SOLN
1.0000 g | Freq: Once | INTRAVENOUS | Status: AC
  Administered 2012-11-21: 1 g via INTRAVENOUS
  Filled 2012-11-21: qty 10

## 2012-11-21 MED ORDER — COLESTIPOL HCL 1 G PO TABS
1.0000 g | ORAL_TABLET | Freq: Every day | ORAL | Status: DC
  Administered 2012-11-22 – 2012-11-23 (×3): 1 g via ORAL
  Filled 2012-11-21 (×4): qty 1

## 2012-11-21 MED ORDER — LOSARTAN POTASSIUM 25 MG PO TABS
25.0000 mg | ORAL_TABLET | Freq: Every day | ORAL | Status: DC
Start: 2012-11-22 — End: 2012-11-24
  Administered 2012-11-22 – 2012-11-24 (×3): 25 mg via ORAL
  Filled 2012-11-21 (×3): qty 1

## 2012-11-21 MED ORDER — SODIUM CHLORIDE 0.9 % IJ SOLN: 3.0000 mL | INTRAMUSCULAR | Status: DC | PRN

## 2012-11-21 MED ORDER — SODIUM CHLORIDE 0.9 % IJ SOLN
3.0000 mL | Freq: Two times a day (BID) | INTRAMUSCULAR | Status: DC
  Administered 2012-11-22 – 2012-11-24 (×6): 3 mL via INTRAVENOUS

## 2012-11-21 MED ORDER — GABAPENTIN 400 MG PO CAPS
800.0000 mg | ORAL_CAPSULE | Freq: Three times a day (TID) | ORAL | Status: DC
  Administered 2012-11-22 – 2012-11-24 (×8): 800 mg via ORAL
  Filled 2012-11-21 (×10): qty 2

## 2012-11-21 MED ORDER — ACETAMINOPHEN 325 MG PO TABS
650.0000 mg | ORAL_TABLET | ORAL | Status: DC | PRN
  Administered 2012-11-22 (×2): 650 mg via ORAL
  Filled 2012-11-21 (×2): qty 2

## 2012-11-21 MED ORDER — NORTRIPTYLINE HCL 25 MG PO CAPS
25.0000 mg | ORAL_CAPSULE | Freq: Every day | ORAL | Status: DC
  Administered 2012-11-22 – 2012-11-23 (×3): 25 mg via ORAL
  Filled 2012-11-21 (×4): qty 1

## 2012-11-21 NOTE — ED Notes (Signed)
Pt here with shortness of breath that started this am and has progressively gotten worse.  Pt has hx of CHF.  Pt also reporting pressure to chest during inhalation.  Initially 89% on RA now 98% on 4L.

## 2012-11-21 NOTE — H&P (Signed)
Cardiology History and Physical  Darnelle Bos, MD  History of Present Illness (and review of medical records): Bianca [name] is a 79 y.o. female who presents for evaluation of shortness of breath.  She has history of nonischemic cardiomyopathy, chronic systolic heart failure, left bundle branch block. She is status post biventricular ICD implantation.  She awoke with shortness of breath yesterday am.  This was associated with orthopnea.  Symptoms continued throughout the day and was unable to get comfortable even sitting up in chair.  She denied any chest pain or ICD discharge.  She was brought in by family for further evaluation.  Review of Systems Further review of systems was otherwise negative other than stated in HPI.  Patient Active Problem List   Diagnosis Date Noted  . HYPOKALEMIA 08/09/2010  . PERIPHERAL NEUROPATHY 08/09/2010  . CHF 08/09/2010  . IMPLANTATION OF DEFIBRILLATOR, HX OF 08/09/2010   Past Medical History  Diagnosis Date  . Pacemaker   . ICD (implantable cardiac defibrillator) in place   . Myocardial infarction 2008  . Sleep apnea   . Arthritis   . Neuromuscular disorder   . CHF (congestive heart failure)   . Hypertension   . Dysrhythmia     Past Surgical History  Procedure Laterality Date  . Cholecystectomy    . Abdominal hysterectomy    . Ankle surgery    . Insert / replace / remove pacemaker  2008  . Knee ligament reconstruction  2012  . Tonsillectomy    . Cardiac catheterization    . Eye surgery      Prescriptions prior to admission  Medication Sig Dispense Refill  . allopurinol (ZYLOPRIM) 100 MG tablet Take 100 mg by mouth daily. Takes along with a 300 mg tablet to equal 400 mg      . allopurinol (ZYLOPRIM) 300 MG tablet Take 300 mg by mouth daily. Takes along with a 100mg  tablet to equal 400mg       . Calcium Carbonate-Vitamin D (CALCIUM 600+D) 600-400 MG-UNIT per tablet Take 1 tablet by mouth 2 (two) times daily.      . carvedilol  (COREG) 12.5 MG tablet Take 12.5 mg by mouth 2 (two) times daily with a meal.      . Cholecalciferol (VITAMIN D3) 1000 UNITS CAPS Take 1 capsule by mouth daily.      . colestipol (COLESTID) 1 G tablet Take 1 g by mouth at bedtime.      . furosemide (LASIX) 40 MG tablet Take 20 mg by mouth daily.      Marland Kitchen gabapentin (NEURONTIN) 800 MG tablet Take 800 mg by mouth 3 (three) times daily.       Marland Kitchen gemfibrozil (LOPID) 600 MG tablet Take 600 mg by mouth 2 (two) times daily before a meal.      . losartan (COZAAR) 25 MG tablet Take 25 mg by mouth daily.      . Multiple Vitamin (MULTIVITAMIN) tablet Take 1 tablet by mouth daily.      . nortriptyline (PAMELOR) 25 MG capsule Take 25 mg by mouth at bedtime.      . pravastatin (PRAVACHOL) 40 MG tablet Take 80 mg by mouth every evening.       Marland Kitchen spironolactone (ALDACTONE) 25 MG tablet Take 25 mg by mouth daily.      Marland Kitchen warfarin (COUMADIN) 5 MG tablet Take 5-7.5 mg by mouth every morning. tue thus sat 7.5 mg and 5 mg all other days       Allergies  Allergen Reactions  . Neosporin (Neomycin-Polymyxin-Gramicidin)   . Quinapril Hcl     History  Substance Use Topics  . Smoking status: Former Games developer  . Smokeless tobacco: Former Neurosurgeon  . Alcohol Use: No    History reviewed. No pertinent family history.   Objective:  Patient Vitals for the past 8 hrs:  BP Temp Temp src Pulse Resp SpO2 Weight  11/22/12 0755 - - - - - - 107.729 kg (237 lb 8 oz)  11/22/12 0610 127/73 mmHg 97 F (36.1 C) Oral 73 19 92 % -  11/22/12 0237 117/59 mmHg 96.8 F (36 C) Oral 71 20 96 % 107.1 kg (236 lb 1.8 oz)   General appearance: alert, cooperative and appears stated age Head: Normocephalic, without obvious abnormality, atraumatic Eyes: conjunctivae/corneas clear. PERRL, EOM's intact. Fundi benign. Neck: supple, symmetrical, trachea midline Lungs: clear to auscultation bilaterally Heart: regular rate and rhythm Abdomen: soft, non-tender; bowel sounds normal; no masses,  no  organomegaly Extremities: edema bilaterally Neurologic: Grossly normal  Results for orders placed during the hospital encounter of 11/21/12 (from the past 48 hour(s))  BASIC METABOLIC PANEL     Status: Abnormal   Collection Time    11/21/12  5:10 PM      Result Value Range   Sodium 141  135 - 145 mEq/L   Potassium 4.4  3.5 - 5.1 mEq/L   Chloride 99  96 - 112 mEq/L   CO2 31  19 - 32 mEq/L   Glucose, Bld 103 (*) 70 - 99 mg/dL   BUN 27 (*) 6 - 23 mg/dL   Creatinine, Ser 1.61 (*) 0.50 - 1.10 mg/dL   Calcium 09.6  8.4 - 04.5 mg/dL   GFR calc non Af Amer 46 (*) >90 mL/min   GFR calc Af Amer 53 (*) >90 mL/min   Comment:            The eGFR has been calculated     using the CKD EPI equation.     This calculation has not been     validated in all clinical     situations.     eGFR's persistently     <90 mL/min signify     possible Chronic Kidney Disease.  CBC WITH DIFFERENTIAL     Status: None   Collection Time    11/21/12  5:10 PM      Result Value Range   WBC 10.1  4.0 - 10.5 K/uL   RBC 4.11  3.87 - 5.11 MIL/uL   Hemoglobin 13.1  12.0 - 15.0 g/dL   HCT 40.9  81.1 - 91.4 %   MCV 95.6  78.0 - 100.0 fL   MCH 31.9  26.0 - 34.0 pg   MCHC 33.3  30.0 - 36.0 g/dL   RDW 78.2  95.6 - 21.3 %   Platelets 233  150 - 400 K/uL   Neutrophils Relative % 57  43 - 77 %   Neutro Abs 5.8  1.7 - 7.7 K/uL   Lymphocytes Relative 30  12 - 46 %   Lymphs Abs 3.0  0.7 - 4.0 K/uL   Monocytes Relative 8  3 - 12 %   Monocytes Absolute 0.8  0.1 - 1.0 K/uL   Eosinophils Relative 5  0 - 5 %   Eosinophils Absolute 0.5  0.0 - 0.7 K/uL   Basophils Relative 0  0 - 1 %   Basophils Absolute 0.0  0.0 - 0.1 K/uL  PRO B NATRIURETIC PEPTIDE  Status: Abnormal   Collection Time    11/21/12  5:11 PM      Result Value Range   Pro B Natriuretic peptide (BNP) 2083.0 (*) 0 - 450 pg/mL  PROTIME-INR     Status: Abnormal   Collection Time    11/21/12  5:24 PM      Result Value Range   Prothrombin Time 22.1 (*) 11.6  - 15.2 seconds   INR 2.03 (*) 0.00 - 1.49  APTT     Status: Abnormal   Collection Time    11/21/12  5:24 PM      Result Value Range   aPTT 47 (*) 24 - 37 seconds   Comment:            IF BASELINE aPTT IS ELEVATED,     SUGGEST PATIENT RISK ASSESSMENT     BE USED TO DETERMINE APPROPRIATE     ANTICOAGULANT THERAPY.  POCT I-STAT TROPONIN I     Status: None   Collection Time    11/21/12  5:32 PM      Result Value Range   Troponin i, poc 0.03  0.00 - 0.08 ng/mL   Comment 3            Comment: Due to the release kinetics of cTnI,     a negative result within the first hours     of the onset of symptoms does not rule out     myocardial infarction with certainty.     If myocardial infarction is still suspected,     repeat the test at appropriate intervals.  URINALYSIS, ROUTINE W REFLEX MICROSCOPIC     Status: Abnormal   Collection Time    11/21/12  5:50 PM      Result Value Range   Color, Urine YELLOW  YELLOW   APPearance HAZY (*) CLEAR   Specific Gravity, Urine 1.014  1.005 - 1.030   pH 6.5  5.0 - 8.0   Glucose, UA NEGATIVE  NEGATIVE mg/dL   Hgb urine dipstick NEGATIVE  NEGATIVE   Bilirubin Urine NEGATIVE  NEGATIVE   Ketones, ur NEGATIVE  NEGATIVE mg/dL   Protein, ur NEGATIVE  NEGATIVE mg/dL   Urobilinogen, UA 0.2  0.0 - 1.0 mg/dL   Nitrite POSITIVE (*) NEGATIVE   Leukocytes, UA MODERATE (*) NEGATIVE  URINE MICROSCOPIC-ADD ON     Status: Abnormal   Collection Time    11/21/12  5:50 PM      Result Value Range   Squamous Epithelial / LPF FEW (*) RARE   WBC, UA 11-20  <3 WBC/hpf   RBC / HPF 0-2  <3 RBC/hpf   Bacteria, UA MANY (*) RARE   Urine-Other MUCOUS PRESENT    POCT I-STAT TROPONIN I     Status: None   Collection Time    11/21/12  9:19 PM      Result Value Range   Troponin i, poc 0.03  0.00 - 0.08 ng/mL   Comment 3            Comment: Due to the release kinetics of cTnI,     a negative result within the first hours     of the onset of symptoms does not rule out      myocardial infarction with certainty.     If myocardial infarction is still suspected,     repeat the test at appropriate intervals.  TROPONIN I     Status: None   Collection Time  11/21/12 11:36 PM      Result Value Range   Troponin I <0.30  <0.30 ng/mL   Comment:            Due to the release kinetics of cTnI,     a negative result within the first hours     of the onset of symptoms does not rule out     myocardial infarction with certainty.     If myocardial infarction is still suspected,     repeat the test at appropriate intervals.  BASIC METABOLIC PANEL     Status: Abnormal   Collection Time    11/22/12  4:30 AM      Result Value Range   Sodium 142  135 - 145 mEq/L   Potassium 4.0  3.5 - 5.1 mEq/L   Chloride 97  96 - 112 mEq/L   CO2 32  19 - 32 mEq/L   Glucose, Bld 104 (*) 70 - 99 mg/dL   BUN 26 (*) 6 - 23 mg/dL   Creatinine, Ser 1.61  0.50 - 1.10 mg/dL   Calcium 09.6  8.4 - 04.5 mg/dL   GFR calc non Af Amer 47 (*) >90 mL/min   GFR calc Af Amer 54 (*) >90 mL/min   Comment:            The eGFR has been calculated     using the CKD EPI equation.     This calculation has not been     validated in all clinical     situations.     eGFR's persistently     <90 mL/min signify     possible Chronic Kidney Disease.  MAGNESIUM     Status: None   Collection Time    11/22/12  4:30 AM      Result Value Range   Magnesium 2.3  1.5 - 2.5 mg/dL  TROPONIN I     Status: None   Collection Time    11/22/12  4:30 AM      Result Value Range   Troponin I <0.30  <0.30 ng/mL   Comment:            Due to the release kinetics of cTnI,     a negative result within the first hours     of the onset of symptoms does not rule out     myocardial infarction with certainty.     If myocardial infarction is still suspected,     repeat the test at appropriate intervals.  PROTIME-INR     Status: Abnormal   Collection Time    11/22/12  4:30 AM      Result Value Range   Prothrombin Time 20.9  (*) 11.6 - 15.2 seconds   INR 1.88 (*) 0.00 - 1.49   Dg Chest Portable 1 View  11/21/2012   *RADIOLOGY REPORT*  Clinical Data: Shortness of breath  PORTABLE CHEST - 1 VIEW  Comparison: 08/10/2010  Findings: Lungs are essentially clear.  No focal consolidation.  No pleural effusion or pneumothorax.  Mild cardiomegaly.  Left subclavian ICD.  IMPRESSION: No evidence of acute cardiopulmonary disease.   Original Report Authenticated By: Charline Bills, M.D.    ECG:  HR 74, atrial sensed V paced, occasional PVCs  Assessment: Acute on chronic systolic CHF   Plan: 1. Cardiology  Admission  2. Continuous monitoring on Telemetry. 3. Repeat ekg on admit, prn chest pain or arrythmia 4. IV diuresis, strict I/Os, daily weights, 2gm NA diet, Keep  electrolytes stable 5. TTE in am, not since 2008. 6. Medical management to include BB, ARB, Aldactone

## 2012-11-21 NOTE — ED Provider Notes (Signed)
History     CSN: [id number]  Arrival date & time 11/21/12  1613   First MD Initiated Contact with Patient 11/21/12 1626      Chief Complaint  Patient presents with  . Shortness of Breath    (Consider location/radiation/quality/duration/timing/severity/associated sxs/prior treatment) The history is provided by the patient and medical records. No language interpreter was used.    Bianca [name] is a 79 y.o. female  with a hx of myocardial infarction, CHF, pacemaker/defibrillator, sleep apnea, hypertension, dysrhythmia, neuropathy presents to the Emergency Department complaining of gradual, persistent, progressively worsening shortness of breath and chest pressure beginning this morning shortly after waking. Patient had associated diaphoresis, became cool and clammy as well as nausea later this afternoon prior to arrival in the emergency department. She did not have any vomiting or diarrhea. She also denies fever, chills, headache, neck pain, abdominal pain, dizziness, lightheadedness, syncope, dysuria, hematuria. Patient states exertion, including walking at the [employer] today made her shortness of breath worse it was not relieved by rest.  Cherlynn Perches, MD - cardiology with Santiago Bumpers, MD - EP with Corinda Gubler for pacemaker/ICD insertion   Past Medical History  Diagnosis Date  . Pacemaker   . ICD (implantable cardiac defibrillator) in place   . Myocardial infarction 2008  . Sleep apnea   . Arthritis   . Neuromuscular disorder   . CHF (congestive heart failure)   . Hypertension   . Dysrhythmia     Past Surgical History  Procedure Laterality Date  . Cholecystectomy    . Abdominal hysterectomy    . Ankle surgery    . Insert / replace / remove pacemaker  2008  . Knee ligament reconstruction  2012  . Tonsillectomy    . Cardiac catheterization    . Eye surgery      History reviewed. No pertinent family history.  History  Substance Use Topics  . Smoking status: Never  Smoker   . Smokeless tobacco: Not on file  . Alcohol Use: No    OB History   Grav Para Term Preterm Abortions TAB SAB Ect Mult Living                  Review of Systems  Constitutional: Positive for diaphoresis. Negative for fever, appetite change, fatigue and unexpected weight change.  HENT: Negative for mouth sores and neck stiffness.   Eyes: Negative for visual disturbance.  Respiratory: Positive for chest tightness and shortness of breath. Negative for cough and wheezing.   Cardiovascular: Positive for chest pain (described as pressure). Negative for palpitations and leg swelling.  Gastrointestinal: Positive for nausea. Negative for vomiting, abdominal pain, diarrhea and constipation.  Endocrine: Negative for polydipsia, polyphagia and polyuria.  Genitourinary: Negative for dysuria, urgency, frequency and hematuria.  Musculoskeletal: Negative for back pain.  Skin: Positive for pallor. Negative for rash.  Allergic/Immunologic: Negative for immunocompromised state.  Neurological: Negative for syncope, light-headedness and headaches.  Hematological: Does not bruise/bleed easily.  Psychiatric/Behavioral: Negative for sleep disturbance. The patient is not nervous/anxious.     Allergies  Neosporin and Quinapril hcl  Home Medications   Current Outpatient Rx  Name  Route  Sig  Dispense  Refill  . allopurinol (ZYLOPRIM) 100 MG tablet   Oral   Take 100 mg by mouth daily. Takes along with a 300 mg tablet to equal 400 mg         . allopurinol (ZYLOPRIM) 300 MG tablet   Oral   Take 300 mg by  mouth daily. Takes along with a 100mg  tablet to equal 400mg          . Calcium Carbonate-Vitamin D (CALCIUM 600+D) 600-400 MG-UNIT per tablet   Oral   Take 1 tablet by mouth 2 (two) times daily.         . carvedilol (COREG) 12.5 MG tablet   Oral   Take 12.5 mg by mouth 2 (two) times daily with a meal.         . Cholecalciferol (VITAMIN D3) 1000 UNITS CAPS   Oral   Take 1 capsule  by mouth daily.         . colestipol (COLESTID) 1 G tablet   Oral   Take 1 g by mouth at bedtime.         . furosemide (LASIX) 40 MG tablet   Oral   Take 20 mg by mouth daily.         Marland Kitchen gabapentin (NEURONTIN) 800 MG tablet   Oral   Take 800 mg by mouth 3 (three) times daily.          Marland Kitchen gemfibrozil (LOPID) 600 MG tablet   Oral   Take 600 mg by mouth 2 (two) times daily before a meal.         . losartan (COZAAR) 25 MG tablet   Oral   Take 25 mg by mouth daily.         . Multiple Vitamin (MULTIVITAMIN) tablet   Oral   Take 1 tablet by mouth daily.         . nortriptyline (PAMELOR) 25 MG capsule   Oral   Take 25 mg by mouth at bedtime.         . pravastatin (PRAVACHOL) 40 MG tablet   Oral   Take 80 mg by mouth every evening.          Marland Kitchen spironolactone (ALDACTONE) 25 MG tablet   Oral   Take 25 mg by mouth daily.         Marland Kitchen warfarin (COUMADIN) 5 MG tablet   Oral   Take 5-7.5 mg by mouth every morning. tue thus sat 7.5 mg and 5 mg all other days           BP 127/79  Pulse 67  Temp(Src) 98.7 F (37.1 C) (Oral)  Resp 20  SpO2 96%  Physical Exam  Nursing note and vitals reviewed. Constitutional: She is oriented to person, place, and time. She appears well-developed and well-nourished. No distress.  HENT:  Head: Normocephalic and atraumatic.  Mouth/Throat: Oropharynx is clear and moist. No oropharyngeal exudate.  Eyes: Conjunctivae and EOM are normal. Pupils are equal, round, and reactive to light. No scleral icterus.  Neck: Normal range of motion and full passive range of motion without pain. Neck supple. No rigidity.  Cardiovascular: Normal rate, normal heart sounds and intact distal pulses.  An irregular rhythm present.  Pulses:      Radial pulses are 2+ on the right side, and 2+ on the left side.       Dorsalis pedis pulses are 2+ on the right side, and 2+ on the left side.       Posterior tibial pulses are 2+ on the right side, and 2+ on  the left side.  Pulmonary/Chest: Effort normal and breath sounds normal. No accessory muscle usage. Not tachypneic. No respiratory distress. She has no decreased breath sounds. She has no wheezes. She has no rhonchi. She has no rales. She exhibits no  tenderness and no bony tenderness.  Lungs clear and equal with good tidal volume; no sensory muscle use, no tachypnea On oxygen 3 L via nasal cannula during exam  Abdominal: Soft. Bowel sounds are normal. She exhibits no mass. There is no tenderness. There is no rebound and no guarding.  Musculoskeletal: Normal range of motion. She exhibits no edema.  Lymphadenopathy:    She has no cervical adenopathy.  Neurological: She is alert and oriented to person, place, and time. No cranial nerve deficit. She exhibits normal muscle tone. Coordination normal.  Speech is clear and goal oriented Moves extremities without ataxia  Skin: Skin is warm and dry. No rash noted. She is not diaphoretic. No erythema. There is pallor.  Psychiatric: She has a normal mood and affect. Her behavior is normal.    ED Course  Procedures (including critical care time)  Labs Reviewed  BASIC METABOLIC PANEL - Abnormal; Notable for the following:    Glucose, Bld 103 (*)    BUN 27 (*)    Creatinine, Ser 1.12 (*)    GFR calc non Af Amer 46 (*)    GFR calc Af Amer 53 (*)    All other components within normal limits  PRO B NATRIURETIC PEPTIDE - Abnormal; Notable for the following:    Pro B Natriuretic peptide (BNP) 2083.0 (*)    All other components within normal limits  URINALYSIS, ROUTINE W REFLEX MICROSCOPIC - Abnormal; Notable for the following:    APPearance HAZY (*)    Nitrite POSITIVE (*)    Leukocytes, UA MODERATE (*)    All other components within normal limits  PROTIME-INR - Abnormal; Notable for the following:    Prothrombin Time 22.1 (*)    INR 2.03 (*)    All other components within normal limits  APTT - Abnormal; Notable for the following:    aPTT 47 (*)     All other components within normal limits  URINE MICROSCOPIC-ADD ON - Abnormal; Notable for the following:    Squamous Epithelial / LPF FEW (*)    Bacteria, UA MANY (*)    All other components within normal limits  URINE CULTURE  CBC WITH DIFFERENTIAL  POCT I-STAT TROPONIN I   Dg Chest Portable 1 View  11/21/2012   *RADIOLOGY REPORT*  Clinical Data: Shortness of breath  PORTABLE CHEST - 1 VIEW  Comparison: 08/10/2010  Findings: Lungs are essentially clear.  No focal consolidation.  No pleural effusion or pneumothorax.  Mild cardiomegaly.  Left subclavian ICD.  IMPRESSION: No evidence of acute cardiopulmonary disease.   Original Report Authenticated By: Charline Bills, M.D.   ECG:  Date: 11/21/2012  Rate: 74  Rhythm: ventricular pacemaker  QRS Axis: right  Intervals: PR prolonged  ST/T Wave abnormalities: normal  Conduction Disutrbances:right bundle branch block  Narrative Interpretation: paced rhythm with unifocal PVCs; no old for comparison  Old EKG Reviewed: none available    1. Chest pain   2. Shortness of breath   3. Diaphoresis   4. Nausea alone   5. CHF   6. IMPLANTATION OF DEFIBRILLATOR, HX OF       MDM  Tonny Bollman presents with shortness of breath and chest pressure. Patient's history and symptoms today concerning for ACS. ECG without evidence of STEMI, will obtain a blood work and continue with oxygen administration.  Troponin negative, CBC unremarkable and without leukocytosis, BMP with elevated BUN and creatinine to patient baseline. BNP with elevations to just greater than 2000 however I  do not know patient's baseline.    UA found to be nitrite and leukocyte positive with many bacteria and white blood cells 11-20. Patient given Rocephin IV 1 g. I feel it is safe for her to be discharged home with Keflex at the end of her stay.  Concern for cardiac etiology of Chest Pain. [facility] has been consulted and will see patient in the ED for likely admit.   Pt has been re-evaluated prior to consult and VSS, NAD, heart RRR, lungs CTAB. No acute abnormalities found on EKG and first round of cardiac enzymes negative; repeat troponin pending.   This case was discussed with Dr. Derwood Kaplan who has seen the patient and agrees with plan to admit.    Dahlia Client Bunnie Lederman, PA-C 11/21/12 2030

## 2012-11-22 ENCOUNTER — Encounter (HOSPITAL_COMMUNITY): Payer: Self-pay | Admitting: Interventional Cardiology

## 2012-11-22 DIAGNOSIS — I5021 Acute systolic (congestive) heart failure: Secondary | ICD-10-CM | POA: Insufficient documentation

## 2012-11-22 LAB — BLOOD GAS, ARTERIAL
Acid-Base Excess: 7 mmol/L — ABNORMAL HIGH (ref 0.0–2.0)
Bicarbonate: 32 mEq/L — ABNORMAL HIGH (ref 20.0–24.0)
O2 Saturation: 95.5 %
Patient temperature: 98.6
TCO2: 33.7 mmol/L (ref 0–100)

## 2012-11-22 LAB — BASIC METABOLIC PANEL
BUN: 26 mg/dL — ABNORMAL HIGH (ref 6–23)
Calcium: 10.5 mg/dL (ref 8.4–10.5)
GFR calc non Af Amer: 47 mL/min — ABNORMAL LOW (ref >90)
Glucose, Bld: 104 mg/dL — ABNORMAL HIGH (ref 70–99)
Potassium: 4 mEq/L (ref 3.5–5.1)

## 2012-11-22 LAB — PROTIME-INR: INR: 1.88 — ABNORMAL HIGH (ref 0.00–1.49)

## 2012-11-22 LAB — TROPONIN I: Troponin I: <0.3 ng/mL (ref <0.30)

## 2012-11-22 MED ORDER — WARFARIN SODIUM 7.5 MG PO TABS
7.5000 mg | ORAL_TABLET | Freq: Once | ORAL | Status: AC
  Administered 2012-11-22: 7.5 mg via ORAL
  Filled 2012-11-22: qty 1

## 2012-11-22 MED ORDER — WARFARIN - PHARMACIST DOSING INPATIENT: Freq: Every day | Status: DC

## 2012-11-22 MED ORDER — PRAVASTATIN SODIUM 40 MG PO TABS
80.0000 mg | ORAL_TABLET | Freq: Every evening | ORAL | Status: DC
  Administered 2012-11-22 – 2012-11-23 (×2): 80 mg via ORAL
  Filled 2012-11-22 (×3): qty 2

## 2012-11-22 MED ORDER — FUROSEMIDE 10 MG/ML IJ SOLN
20.0000 mg | Freq: Every day | INTRAMUSCULAR | Status: DC
  Administered 2012-11-22: 20 mg via INTRAVENOUS
  Filled 2012-11-22: qty 2

## 2012-11-22 MED ORDER — CEPHALEXIN 500 MG PO CAPS
500.0000 mg | ORAL_CAPSULE | Freq: Three times a day (TID) | ORAL | Status: DC
  Administered 2012-11-22 – 2012-11-24 (×7): 500 mg via ORAL
  Filled 2012-11-22 (×9): qty 1

## 2012-11-22 NOTE — Progress Notes (Addendum)
SUBJECTIVE:   Feels tired.  Breathing is improved.  Short of breath started yesterday.  Prior to that, she was at her baseline state of health.  Exercise is limited by lower extremity neuropathy symptoms. OBJECTIVE:   Vitals:   Filed Vitals:   11/21/12 2300 11/22/12 0237 11/22/12 0610 11/22/12 0755  BP: 125/73 117/59 127/73   Pulse: 80 71 73   Temp: 98 F (36.7 C) 96.8 F (36 C) 97 F (36.1 C)   TempSrc: Oral Oral Oral   Resp: 18 20 19    Height: 5\' 6"  (1.676 m)     Weight: 110.723 kg (244 lb 1.6 oz) 107.1 kg (236 lb 1.8 oz)  107.729 kg (237 lb 8 oz)  SpO2: 92% 96% 92%    I&O's:   Intake/Output Summary (Last 24 hours) at 11/22/12 0945 Last data filed at 11/22/12 0900  Gross per 24 hour  Intake    582 ml  Output   2100 ml  Net  -1518 ml   TELEMETRY: Reviewed telemetry pt in atrial fibrillation, ventricular pacing:     PHYSICAL EXAM General: Well developed, well nourished, in no acute distress Head:   Normal cephalic and atramatic  Lungs:  Bibasilar crackles Heart:  Irregular rhythm, normal rate, S1, S2 Abdomen:  Obese  Extremities:  No  edema.  DP +1 Neuro: Alert and oriented X 3. Psych:  Flat  affect, responds appropriately   LABS: Basic Metabolic Panel:  Recent Labs  16/10/96 1710 11/22/12 0430  NA 141 142  K 4.4 4.0  CL 99 97  CO2 31 32  GLUCOSE 103* 104*  BUN 27* 26*  CREATININE 1.12* 1.10  CALCIUM 10.0 10.5  MG  --  2.3   Liver Function Tests: No results found for this basename: AST, ALT, ALKPHOS, BILITOT, PROT, ALBUMIN,  in the last 72 hours No results found for this basename: LIPASE, AMYLASE,  in the last 72 hours CBC:  Recent Labs  11/21/12 1710  WBC 10.1  NEUTROABS 5.8  HGB 13.1  HCT 39.3  MCV 95.6  PLT 233   Cardiac Enzymes:  Recent Labs  11/21/12 2336 11/22/12 0430  TROPONINI <0.30 <0.30   BNP: No components found with this basename: POCBNP,  D-Dimer: No results found for this basename: DDIMER,  in the last 72  hours Hemoglobin A1C: No results found for this basename: HGBA1C,  in the last 72 hours Fasting Lipid Panel: No results found for this basename: CHOL, HDL, LDLCALC, TRIG, CHOLHDL, LDLDIRECT,  in the last 72 hours Thyroid Function Tests: No results found for this basename: TSH, T4TOTAL, FREET3, T3FREE, THYROIDAB,  in the last 72 hours Anemia Panel: No results found for this basename: VITAMINB12, FOLATE, FERRITIN, TIBC, IRON, RETICCTPCT,  in the last 72 hours Coag Panel:   Lab Results  Component Value Date   INR 1.88* 11/22/2012   INR 2.03* 11/21/2012   INR 2.16* 09/07/2010    RADIOLOGY: Dg Chest Portable 1 View  11/21/2012   *RADIOLOGY REPORT*  Clinical Data: Shortness of breath  PORTABLE CHEST - 1 VIEW  Comparison: 08/10/2010  Findings: Lungs are essentially clear.  No focal consolidation.  No pleural effusion or pneumothorax.  Mild cardiomegaly.  Left subclavian ICD.  IMPRESSION: No evidence of acute cardiopulmonary disease.   Original Report Authenticated By: Charline Bills, M.D.      ASSESSMENT: long-standing nonischemic cardiomyopathy, acute on chronic systolic heart failure, shortness of breath  PLAN:  Continue IV diuresis.  She is feeling somewhat better.  Kidney function is stable.  Continue to rate control atrial fibrillation.  Coumadin for stroke prevention.  Shortness of breath improving.  She may need physical therapy consult she feels stronger.  Chest pain.  Ruled out for MI by enzymes.  Doubt ischemia.  Corky Crafts., MD  11/22/2012  9:45 AM

## 2012-11-22 NOTE — Progress Notes (Signed)
Pt. resting in the bed son at the bedside O2 in place. No requests/needs at this time.

## 2012-11-22 NOTE — Progress Notes (Signed)
Pt admitted to 4709, came by stretcher, family member at the bedside.  AO x 4, VSS, denies any pain or discomfort. V paced on monitor, no distress noticed. Pt and family member oriented to the unit, to her room and about hourly rounding to meet her needs. Pt encouraged to call for assistance to get OOB to prevent any fall or injury while in the hospital. Family member at bedside refuse alarm on at this time. We'll continue with  POC.

## 2012-11-22 NOTE — Progress Notes (Signed)
Pt very drowsy at this time with slurred speech, family member at the bedside, assessment completed pt able to follow commands but very difficult to keep awake. VSS at this time. Family expressed concern that back experience with other admission for CHF exacerbation pt got very lethargic and confuse secondary to dehydration. Dr. Eldridge Dace paged.

## 2012-11-22 NOTE — Progress Notes (Signed)
ANTICOAGULATION CONSULT NOTE - Initial Consult  Pharmacy Consult for warfarin Indication: atrial fibrillation  Allergies  Allergen Reactions  . Neosporin (Neomycin-Polymyxin-Gramicidin)   . Quinapril Hcl     Patient Measurements: Height: 5\' 6"  (167.6 cm) Weight: 237 lb 8 oz (107.729 kg) (scale a) IBW/kg (Calculated) : 59.3  Vital Signs: Temp: 97 F (36.1 C) (06/07 0610) Temp src: Oral (06/07 0610) BP: 127/73 mmHg (06/07 0610) Pulse Rate: 73 (06/07 0610)  Labs:  Recent Labs  11/21/12 1710 11/21/12 1724 11/21/12 2336 11/22/12 0430  HGB 13.1  --   --   --   HCT 39.3  --   --   --   PLT 233  --   --   --   APTT  --  47*  --   --   LABPROT  --  22.1*  --  20.9*  INR  --  2.03*  --  1.88*  CREATININE 1.12*  --   --  1.10  TROPONINI  --   --  <0.30 <0.30    Estimated Creatinine Clearance: 52.4 ml/min (by C-G formula based on Cr of 1.1).   Medical History: Past Medical History  Diagnosis Date  . Pacemaker   . ICD (implantable cardiac defibrillator) in place   . Myocardial infarction 2008  . Sleep apnea   . Arthritis   . Neuromuscular disorder   . CHF (congestive heart failure)   . Hypertension   . Dysrhythmia   . Chronic systolic heart failure   . Acute systolic heart failure     Medications:  Scheduled:  . allopurinol  100 mg Oral Daily  . allopurinol  300 mg Oral Daily  . calcium-vitamin D  1 tablet Oral BID  . carvedilol  6.25 mg Oral BID WC  . cholecalciferol  1,000 Units Oral Daily  . colestipol  1 g Oral QHS  . gabapentin  800 mg Oral TID  . gemfibrozil  600 mg Oral BID AC  . losartan  25 mg Oral Daily  . multivitamin with minerals  1 tablet Oral Daily  . nortriptyline  25 mg Oral QHS  . simvastatin  20 mg Oral q1800  . sodium chloride  3 mL Intravenous Q12H  . spironolactone  25 mg Oral Daily    Assessment: 79 yo female admitted with shortness of breath. Patient is on warfarin prior to admission for history of afib. Her home dose is 7.5mg   on Tuesday, Thursday, and Saturday. She takes 5mg  on Monday, Wednesday, Friday,and Sunday. Her admit INR was 2.03. INR today is 1.88. Will give 7.5 mg tonight per home dose. If INR continues to be on low side of range, will increase dose. CBC is wnl.  Goal of Therapy:  INR 2-3   Plan:  - Warfarin 7.5 mg tonight at 1800 - Daily PT/INR  Lillia Pauls, PharmD Clinical Pharmacist Pager: [phone] 11/22/2012 10:54 AM

## 2012-11-22 NOTE — Progress Notes (Signed)
Report received from [place], [place]. Pt sleeping in bed family at the bedside. Weight elevated from admission weight will reweigh pt this morning. O2 at 2L [place] in place.

## 2012-11-22 NOTE — Progress Notes (Signed)
  Echocardiogram 2D Echocardiogram has been performed.  Bianca [name] 11/22/2012, 3:12 PM 

## 2012-11-22 NOTE — Progress Notes (Signed)
Telephone order received by Dr. Eldridge Dace to draw ABG stat, PT consult in am and hold am dose of Lasix. We'll continue to monitor.

## 2012-11-23 LAB — PROTIME-INR
INR: 1.99 — ABNORMAL HIGH (ref 0.00–1.49)
Prothrombin Time: 21.8 seconds — ABNORMAL HIGH (ref 11.6–15.2)

## 2012-11-23 LAB — BASIC METABOLIC PANEL
CO2: 33 mEq/L — ABNORMAL HIGH (ref 19–32)
Calcium: 10.3 mg/dL (ref 8.4–10.5)
GFR calc Af Amer: 54 mL/min — ABNORMAL LOW (ref >90)
Sodium: 138 mEq/L (ref 135–145)

## 2012-11-23 MED ORDER — WARFARIN SODIUM 7.5 MG PO TABS
7.5000 mg | ORAL_TABLET | Freq: Once | ORAL | Status: AC
  Administered 2012-11-23: 7.5 mg via ORAL
  Filled 2012-11-23: qty 1

## 2012-11-23 MED ORDER — FUROSEMIDE 20 MG PO TABS
20.0000 mg | ORAL_TABLET | Freq: Every day | ORAL | Status: DC
  Administered 2012-11-23 – 2012-11-24 (×2): 20 mg via ORAL
  Filled 2012-11-23 (×2): qty 1

## 2012-11-23 NOTE — Evaluation (Signed)
Physical Therapy Evaluation Patient Details Name: Bianca [name] MRN: [id number] DOB: 1933/04/07 Today's Date: 11/23/2012 Time: 7829-5621 PT Time Calculation (min): 21 min  PT Assessment / Plan / Recommendation Clinical Impression  Patient is a 79 yo female admitted with CHF, nausea, SOB.  Patient with general weakness, decreased balance, and decreased cognition, all impacting functional mobility.  Will benefit from acute PT to address these issues.  Recommend HHPT for continued therapy at home.    PT Assessment  Patient needs continued PT services    Follow Up Recommendations  Home health PT;Supervision/Assistance - 24 hour    Does the patient have the potential to tolerate intense rehabilitation      Barriers to Discharge        Equipment Recommendations  None recommended by PT    Recommendations for Other Services     Frequency Min 3X/week    Precautions / Restrictions Precautions Precautions: Fall Restrictions Weight Bearing Restrictions: No   Pertinent Vitals/Pain       Mobility  Bed Mobility Bed Mobility: Not assessed Transfers Transfers: Sit to Stand;Stand to Sit Sit to Stand: 4: Min guard;With armrests;From chair/3-in-1 Stand to Sit: 4: Min guard;With armrests;To chair/3-in-1 Details for Transfer Assistance: Verbal cues for hand placement and safety.  Assist for balance Ambulation/Gait Ambulation/Gait Assistance: 4: Min guard Ambulation Distance (Feet): 200 Feet Assistive device: Rolling walker Ambulation/Gait Assistance Details: Verbal cues for safe use of RW.  Balance decreased during turns. Gait Pattern: Step-through pattern;Decreased stride length;Trunk flexed Gait velocity: Slow gait speed. General Gait Details: Patient occasionally ambulates without assistive device.  Encouraged use of RW at all times when ambulating.    Exercises     PT Diagnosis: Difficulty walking;Generalized weakness  PT Problem List: Decreased strength;Decreased activity  tolerance;Decreased balance;Decreased mobility;Decreased cognition;Decreased knowledge of use of DME;Cardiopulmonary status limiting activity;Obesity PT Treatment Interventions: DME instruction;Gait training;Functional mobility training;Balance training;Patient/family education   PT Goals Acute Rehab PT Goals PT Goal Formulation: With patient/family Time For Goal Achievement: 11/30/12 Potential to Achieve Goals: Good Pt will go Supine/Side to Sit: Independently;with HOB 0 degrees PT Goal: Supine/Side to Sit - Progress: Goal set today Pt will go Sit to Stand: with modified independence;with upper extremity assist PT Goal: Sit to Stand - Progress: Goal set today Pt will Ambulate: >150 feet;with modified independence;with rolling walker PT Goal: Ambulate - Progress: Goal set today  Visit Information  Last PT Received On: 11/23/12 Assistance Needed: +1    Subjective Data  Subjective: "I fell in January and my knee still has trouble" Patient Stated Goal: To go home tomorrow   Prior Functioning  Home Living Lives With: Spouse Available Help at Discharge: Family;Available 24 hours/day (Daughter from [facility] and 2 sons) Type of Home: House Home Access: Stairs to enter Secretary/administrator of Steps: 2 Entrance Stairs-Rails: Right Home Layout: One level Bathroom Shower/Tub: Health visitor: Handicapped height Bathroom Accessibility: Yes How Accessible: Accessible via walker Home Adaptive Equipment: Grab bars around toilet;Grab bars in shower;Built-in shower seat;Walker - rolling;Straight cane Prior Function Level of Independence: Independent with assistive device(s);Needs assistance Needs Assistance: Light Housekeeping Light Housekeeping: Moderate (Has lady come 2x/week) Able to Take Stairs?: Yes Driving: Yes Vocation: Retired Musician: HOH (wears hearing aid)    Cognition  Cognition Arousal/Alertness: Awake/alert Behavior During  Therapy: Flat affect Overall Cognitive Status: History of cognitive impairments - at baseline (Short-term memory deficits)    Extremity/Trunk Assessment Right Upper Extremity Assessment RUE ROM/Strength/Tone: WFL for tasks assessed RUE Sensation: [facility] -  Light Touch Left Upper Extremity Assessment LUE ROM/Strength/Tone: WFL for tasks assessed LUE Sensation: WFL - Light Touch Right Lower Extremity Assessment RLE ROM/Strength/Tone: Deficits RLE ROM/Strength/Tone Deficits: Strength 4-/5; Bruising on Rt knee RLE Sensation: History of peripheral neuropathy Left Lower Extremity Assessment LLE ROM/Strength/Tone: Deficits LLE ROM/Strength/Tone Deficits: Strength 4-/5 LLE Sensation: History of peripheral neuropathy Trunk Assessment Trunk Assessment: Normal   Balance    End of Session PT - End of Session Equipment Utilized During Treatment: Gait belt Activity Tolerance: Patient tolerated treatment well Patient left: in chair;with call bell/phone within reach;with family/visitor present Nurse Communication: Mobility status  GP     Vena Austria 11/23/2012, 6:09 PM Durenda Hurt. Renaldo Fiddler, [facility] Acute Rehab Services Pager [phone]

## 2012-11-23 NOTE — Progress Notes (Signed)
SUBJECTIVE:   Feels tired.  Breathing is improved.  Family reports that she sleeps all the time and that she was somewhat hard to arouse yesterday evening.  It resolved on its own.  Of note, she has sleep apnea and is unable to tolerate CPAP.   She does use oxygen at home at night.  Exercise is limited by lower extremity neuropathy symptoms. OBJECTIVE:   Vitals:   Filed Vitals:   11/22/12 0755 11/22/12 1500 11/22/12 2114 11/23/12 0515  BP:  117/54 105/58 110/58  Pulse:  75 75 71  Temp:  97.6 F (36.4 C) 98.1 F (36.7 C) 97.3 F (36.3 C)  TempSrc:  Oral Oral Oral  Resp:  20 18 18   Height:      Weight: 107.729 kg (237 lb 8 oz)   107.729 kg (237 lb 8 oz)  SpO2:  92% 95% 96%   I&O's:    Intake/Output Summary (Last 24 hours) at 11/23/12 1001 Last data filed at 11/23/12 0600  Gross per 24 hour  Intake    718 ml  Output   1300 ml  Net   -582 ml   TELEMETRY: Reviewed telemetry pt in atrial fibrillation, ventricular pacing:     PHYSICAL EXAM General: Well developed, well nourished, in no acute distress Head:   Normal cephalic and atramatic  Lungs:  Bibasilar crackles Heart:  Irregular rhythm, normal rate, S1, S2 Abdomen:  Obese  Extremities:  No  edema.  DP +1 Neuro: Alert and oriented X 3. Psych:  Flat  affect, responds appropriately   LABS: Basic Metabolic Panel:  Recent Labs  78/29/56 1710 11/22/12 0430 11/23/12 0505  NA 141 142 138  K 4.4 4.0 3.9  CL 99 97 97  CO2 31 32 33*  GLUCOSE 103* 104* 108*  BUN 27* 26* 32*  CREATININE 1.12* 1.10 1.11*  CALCIUM 10.0 10.5 10.3  MG  --  2.3  --    Liver Function Tests: No results found for this basename: AST, ALT, ALKPHOS, BILITOT, PROT, ALBUMIN,  in the last 72 hours No results found for this basename: LIPASE, AMYLASE,  in the last 72 hours CBC:  Recent Labs  11/21/12 1710  WBC 10.1  NEUTROABS 5.8  HGB 13.1  HCT 39.3  MCV 95.6  PLT 233   Cardiac Enzymes:  Recent Labs  11/21/12 2336 11/22/12 0430   TROPONINI <0.30 <0.30   BNP: No components found with this basename: POCBNP,  D-Dimer: No results found for this basename: DDIMER,  in the last 72 hours Hemoglobin A1C: No results found for this basename: HGBA1C,  in the last 72 hours Fasting Lipid Panel: No results found for this basename: CHOL, HDL, LDLCALC, TRIG, CHOLHDL, LDLDIRECT,  in the last 72 hours Thyroid Function Tests:  Recent Labs  11/22/12 0430  TSH 8.030*   Anemia Panel: No results found for this basename: VITAMINB12, FOLATE, FERRITIN, TIBC, IRON, RETICCTPCT,  in the last 72 hours Coag Panel:   Lab Results  Component Value Date   INR 1.99* 11/23/2012   INR 1.88* 11/22/2012   INR 2.03* 11/21/2012    RADIOLOGY: Dg Chest Portable 1 View  11/21/2012   *RADIOLOGY REPORT*  Clinical Data: Shortness of breath  PORTABLE CHEST - 1 VIEW  Comparison: 08/10/2010  Findings: Lungs are essentially clear.  No focal consolidation.  No pleural effusion or pneumothorax.  Mild cardiomegaly.  Left subclavian ICD.  IMPRESSION: No evidence of acute cardiopulmonary disease.   Original Report Authenticated By: Charline Bills, M.D.  ASSESSMENT: long-standing nonischemic cardiomyopathy, acute on chronic systolic heart failure, shortness of breath  PLAN:  Changed to oral Lasix for diuresis.  She is feeling somewhat better.  Kidney function is stable.  Continue to rate control atrial fibrillation.  Coumadin for stroke prevention.  Shortness of breath improving.  Plan for physical therapy consult.  Chest pain.  Ruled out for MI by enzymes.  Doubt ischemia.  Discharge from hospital once she is off of oxygen and able to ambulate at her baseline level, which is somewhat decreased due to peripheral neuropathy.  As an outpatient, she can investigate different types of masks that may help her tolerate CPAP.  She does have some hypercarbia at baseline based on the ECG that was done.  Corky Crafts., MD  11/23/2012  10:01 AM

## 2012-11-23 NOTE — Progress Notes (Signed)
Report receive from Kandis Fantasia, RN at the bedside, family members at the bedside and visitors, pt is ok to give report on the room. Pt up on the chair continues with O2 [place], denies any pain or discomfort. We'll continue with POC.

## 2012-11-23 NOTE — Progress Notes (Signed)
Received report from [place], Charity fundraiser. Pt laying in the bed asleep, daughter in law at the bedside. O2 2L via .

## 2012-11-23 NOTE — Progress Notes (Signed)
ANTICOAGULATION CONSULT NOTE - Follow Up Consult  Pharmacy Consult for warfarin Indication: atrial fibrillation  Allergies  Allergen Reactions  . Neosporin (Neomycin-Polymyxin-Gramicidin)   . Quinapril Hcl     Patient Measurements: Height: 5\' 6"  (167.6 cm) Weight: 237 lb 8 oz (107.729 kg) (scale a) IBW/kg (Calculated) : 59.3  Vital Signs: Temp: 97.3 F (36.3 C) (06/08 0515) Temp src: Oral (06/08 0515) BP: 110/58 mmHg (06/08 0515) Pulse Rate: 71 (06/08 0515)  Labs:  Recent Labs  11/21/12 1710 11/21/12 1724 11/21/12 2336 11/22/12 0430 11/23/12 0505  HGB 13.1  --   --   --   --   HCT 39.3  --   --   --   --   PLT 233  --   --   --   --   APTT  --  47*  --   --   --   LABPROT  --  22.1*  --  20.9* 21.8*  INR  --  2.03*  --  1.88* 1.99*  CREATININE 1.12*  --   --  1.10 1.11*  TROPONINI  --   --  <0.30 <0.30  --     Estimated Creatinine Clearance: 51.9 ml/min (by C-G formula based on Cr of 1.11).   Medications:  Scheduled:  . allopurinol  100 mg Oral Daily  . allopurinol  300 mg Oral Daily  . calcium-vitamin D  1 tablet Oral BID  . carvedilol  6.25 mg Oral BID WC  . cephALEXin  500 mg Oral Q8H  . cholecalciferol  1,000 Units Oral Daily  . colestipol  1 g Oral QHS  . furosemide  20 mg Intravenous Daily  . gabapentin  800 mg Oral TID  . gemfibrozil  600 mg Oral BID AC  . losartan  25 mg Oral Daily  . multivitamin with minerals  1 tablet Oral Daily  . nortriptyline  25 mg Oral QHS  . pravastatin  80 mg Oral QPM  . sodium chloride  3 mL Intravenous Q12H  . spironolactone  25 mg Oral Daily  . Warfarin - Pharmacist Dosing Inpatient   Does not apply q1800    Assessment: 79 yo female admitted with shortness of breath. Patient is on warfarin prior to admission for history of afib. INR today is 1.99. Will give another 7.5mg  tonight (slightly different from home regimen) to help ensure patient gets back into therapeutic range.  Goal of Therapy:  INR 2-3   Plan:   - Warfarin 7.5 mg x1 tonight - Daily INR  Lillia Pauls, PharmD Clinical Pharmacist Pager: [phone] Phone: [phone] 11/23/2012 8:35 AM

## 2012-11-23 NOTE — Progress Notes (Signed)
Pt. Ambulated in hallway without O2 with front wheel rolling walker. SPO2 dropped to 89% recovered on RA stopped and rested in hallway and returned to 94%. Pt stated she felt slightly short of breath but over all she felt good. Pt returned back to chair. Educated pt on the use of her O2 and weaning off. Will cont to monitor.

## 2012-11-24 LAB — BASIC METABOLIC PANEL
BUN: 34 mg/dL — ABNORMAL HIGH (ref 6–23)
CO2: 31 mEq/L (ref 19–32)
Chloride: 97 mEq/L (ref 96–112)
Creatinine, Ser: 1.15 mg/dL — ABNORMAL HIGH (ref 0.50–1.10)
Glucose, Bld: 102 mg/dL — ABNORMAL HIGH (ref 70–99)

## 2012-11-24 LAB — URINE CULTURE

## 2012-11-24 MED ORDER — NITROGLYCERIN 0.4 MG SL SUBL: 0.4000 mg | SUBLINGUAL_TABLET | SUBLINGUAL | Status: DC | PRN

## 2012-11-24 MED ORDER — WARFARIN SODIUM 5 MG PO TABS
5.0000 mg | ORAL_TABLET | Freq: Once | ORAL | Status: DC
  Filled 2012-11-24: qty 1

## 2012-11-24 NOTE — Progress Notes (Signed)
Physical Therapy Treatment Patient Details Name: Bianca [name] MRN: [id number] DOB: July 02, 1932 Today's Date: 11/24/2012 Time: 0825-0903 PT Time Calculation (min): 38 min  PT Assessment / Plan / Recommendation Comments on Treatment Session  Pt functioning near baseline however remains at increased falls risk. Pt requires use of RW for safe ambulation. Recommend 24/7 supervision due to spouse just being diagnosed with alzheimers and pt at increased falls risk with short term memory deficits and decreased insight to deficits. Daughter-in-law present and agrees and will be providing 24/7 assist. Pt safe to d/c home with above recommendations when medically stable.    Follow Up Recommendations  Home health PT;Supervision/Assistance - 24 hour     Does the patient have the potential to tolerate intense rehabilitation     Barriers to Discharge        Equipment Recommendations  None recommended by PT    Recommendations for Other Services    Frequency Min 3X/week   Plan Discharge plan remains appropriate;Frequency remains appropriate    Precautions / Restrictions Precautions Precautions: Fall Restrictions Weight Bearing Restrictions: No   Pertinent Vitals/Pain Denies pain SpO2 > 91% at rest and during amb/stair negotiation on RA    Mobility  Bed Mobility Bed Mobility: Not assessed Transfers Transfers: Sit to Stand;Stand to Sit Sit to Stand: 4: Min guard;With armrests;From chair/3-in-1 Stand to Sit: 4: Min guard;With armrests;To chair/3-in-1 Details for Transfer Assistance: Verbal cues for hand placement and safety.  Assist for balance Ambulation/Gait Ambulation/Gait Assistance: 4: Min guard Ambulation Distance (Feet): 150 Feet (x2) Assistive device: Rolling walker Ambulation/Gait Assistance Details: pt SpO2 stayed >91% on RA during amb, minimal SOB. Pt reports she feels back to normal Gait Pattern: Step-through pattern;Decreased stride length;Trunk flexed Gait velocity: Slow  gait speed. General Gait Details: attempted ambulation without RW however pt with increased instablity and reaching for railings/counters to hold on to. Stairs: Yes Stairs Assistance: 4: Min Editor, commissioning Details (indicate cue type and reason): assist for balance, safety, v/c's for sequencing Stair Management Technique: One rail Right (HHA on Left) Number of Stairs: 2 (to mimic home set up)    Exercises     PT Diagnosis:    PT Problem List:   PT Treatment Interventions:     PT Goals Acute Rehab PT Goals PT Goal: Sit to Stand - Progress: Progressing toward goal PT Goal: Ambulate - Progress: Progressing toward goal Additional Goals Additional Goal #1: Pt to asc/desc 2 steps with R hand rail and supervision. PT Goal: Additional Goal #1 - Progress: Goal set today  Visit Information  Last PT Received On: 11/24/12 Assistance Needed: +1    Subjective Data  Subjective: Pt received sitting up in chair agreeable to PT.   Cognition  Cognition Arousal/Alertness: Awake/alert Behavior During Therapy: WFL for tasks assessed/performed Overall Cognitive Status: History of cognitive impairments - at baseline (short term memory deficits)    Balance     End of Session PT - End of Session Equipment Utilized During Treatment: Gait belt Activity Tolerance: Patient tolerated treatment well Patient left: in chair;with call bell/phone within reach;with family/visitor present Nurse Communication: Mobility status (SpO2 status)   GP     Marcene Brawn 11/24/2012, 9:29 AM  Lewis Shock, PT, DPT Pager #: [phone] Office #: [phone]

## 2012-11-24 NOTE — Care Management Note (Signed)
    Page 1 of 2   11/24/2012     11:58:36 AM   CARE MANAGEMENT NOTE 11/24/2012  Patient:  Bianca [name], Bianca [name]   Account Number:  [id number]  Date Initiated:  11/24/2012  Documentation initiated by:  [facility]  Subjective/Objective Assessment:   79 y.o. female who presents for evaluation of shortness of breath. She has history of nonischemic cardiomyopathy, chronic systolic heart failure, left bundle branch block. She is status post biventricular ICD implantation.     Action/Plan:   Diurese/ Home with Home Health   Anticipated DC Date:  11/24/2012   Anticipated DC Plan:  HOME W HOME HEALTH SERVICES      DC Planning Services  CM consult      [facility] Choice  HOME HEALTH   Choice offered to / List presented to:  C-1 Patient        HH arranged  HH-1 RN  HH-10 DISEASE MANAGEMENT  HH-2 PT      HH agency  [facility]   Status of service:  Completed, signed off Medicare Important Message given?   (If response is "NO", the following Medicare IM given date fields will be blank) Date Medicare IM given:   Date Additional Medicare IM given:    Discharge Disposition:    Per UR Regulation:  Reviewed for med. necessity/level of care/duration of stay  If discussed at Long Length of Stay Meetings, dates discussed:    Comments:  11/24/2012.Marland KitchenMarland KitchenOletta Cohn, RN, BSN, [place] [phone] Spoke with pt, son and daughter-in-law regarding discharge planning.  Offered pt list of home health agencies.  Pt and family chose [facility] to render services of RN for disease mangement and PT.  Darlin Drop of [facility] notified.

## 2012-11-24 NOTE — Progress Notes (Signed)
ANTICOAGULATION CONSULT NOTE - Follow Up Consult  Pharmacy Consult for warfarin Indication: atrial fibrillation  Allergies  Allergen Reactions  . Neosporin (Neomycin-Polymyxin-Gramicidin)   . Quinapril Hcl     Patient Measurements: Height: 5\' 6"  (167.6 cm) Weight: 239 lb 13.8 oz (108.8 kg) (scale A) IBW/kg (Calculated) : 59.3  Vital Signs: Temp: 97.8 F (36.6 C) (06/09 0910) Temp src: Oral (06/09 0910) BP: 113/77 mmHg (06/09 0910) Pulse Rate: 76 (06/09 0910)  Labs:  Recent Labs  11/21/12 1710  11/21/12 1724 11/21/12 2336 11/22/12 0430 11/23/12 0505 11/24/12 0410  HGB 13.1  --   --   --   --   --   --   HCT 39.3  --   --   --   --   --   --   PLT 233  --   --   --   --   --   --   APTT  --   --  47*  --   --   --   --   LABPROT  --   < > 22.1*  --  20.9* 21.8* 24.7*  INR  --   < > 2.03*  --  1.88* 1.99* 2.35*  CREATININE 1.12*  --   --   --  1.10 1.11* 1.15*  TROPONINI  --   --   --  <0.30 <0.30  --   --   < > = values in this interval not displayed.  Estimated Creatinine Clearance: 50.3 ml/min (by C-G formula based on Cr of 1.15).   Medications:  Scheduled:  . allopurinol  100 mg Oral Daily  . allopurinol  300 mg Oral Daily  . calcium-vitamin D  1 tablet Oral BID  . carvedilol  6.25 mg Oral BID WC  . cephALEXin  500 mg Oral Q8H  . cholecalciferol  1,000 Units Oral Daily  . colestipol  1 g Oral QHS  . furosemide  20 mg Oral Daily  . gabapentin  800 mg Oral TID  . gemfibrozil  600 mg Oral BID AC  . losartan  25 mg Oral Daily  . multivitamin with minerals  1 tablet Oral Daily  . nortriptyline  25 mg Oral QHS  . pravastatin  80 mg Oral QPM  . sodium chloride  3 mL Intravenous Q12H  . spironolactone  25 mg Oral Daily  . Warfarin - Pharmacist Dosing Inpatient   Does not apply q1800    Assessment: 79 y/o female patient admitted with shortness of breath. Patient is on warfarin prior to admission for history of afib. INR today is therapeutic with trend up,  will attempt to keep on home regimen.  Goal of Therapy:  INR 2-3   Plan:  Coumadin 5mg  today and f/u daily protime.  Verlene Mayer, PharmD, BCPS Pager [phone] 11/24/2012 9:27 AM

## 2012-11-24 NOTE — Plan of Care (Signed)
Problem: Phase I Progression Outcomes Goal: EF % per last Echo/documented,Core Reminder form on chart Outcome: Completed/Met Date Met:  11/24/12 EF 20- 025% from 11/22/2012

## 2012-11-24 NOTE — Progress Notes (Signed)
DC IV, DC Tele, DC Home. Discharge instructions and home medications discussed with patient and patient's family members. Patient and family members denied any questions concerns at this time. Patient leaving unit via wheelchair and appears in no acute distress.

## 2012-11-24 NOTE — Progress Notes (Signed)
Pt is 2 lbs over today. Pt was weight on the standing scale 239.9 lbs and weight again on the bed 239.8 lbs. Pt c/o o SOB with activity.  We'll continue to monitor.

## 2012-11-24 NOTE — Progress Notes (Signed)
Utilization Review Completed [employer]venir Lozinski J. Anvika Gashi, RN, BSN, NCM [phone]  

## 2012-11-24 NOTE — Discharge Summary (Signed)
Patient ID: FRANCHELLE FOSKETT MRN: [id number] DOB/AGE: 1932-11-16 79 y.o.  Admit date: 11/21/2012 Discharge date: 11/24/2012  Primary Discharge Diagnosis acute on chronic systolic heart failure Secondary Discharge Diagnosis cardiomyopathy, urinary tract infection, shortness of breath, peripheral neuropathy, gait instability  Significant Diagnostic Studies: cardiac graphics: Echocardiogram: Decreased left ventricular function  Consults: None  Hospital Course: 79 year old woman who is admitted with fluid overload.  She was short of breath.  She was diureses successfully.  She was also treated for urinary tract infection per the ER physician.  She had an episode of somnolence in the hospital.  ABG was done at her baseline CO2 apparently is above 50.  She does have untreated sleep apnea.  She was given pictures of various CPAP masks that could be used to treat her sleep apnea.  She is willing to try this.  PT consult was performed.  They walked with her while she was not wearing oxygen.  She maintained her oxygen saturations above 92%.  She felt like she was at her baseline.  Renal function remained stable.  INR was therapeutic at the time of discharge.  She will have a home health evaluation to see if he needs any physical therapy at home.  THere will be a nurse to evaluate the patient at home as well.  Discharge plans were extensively discussed with the patient and her daughter.   Discharge Exam: Blood pressure 107/60, pulse 69, temperature 97.4 F (36.3 C), temperature source Oral, resp. rate 18, height 5\' 6"  (1.676 m), weight 108.8 kg (239 lb 13.8 oz), SpO2 95.00%.  /AT Irregular, normal rate, S1-S2 Clear to auscultation bilaterally Obese No lower extremity edema  Labs:   Lab Results  Component Value Date   WBC 10.1 11/21/2012   HGB 13.1 11/21/2012   HCT 39.3 11/21/2012   MCV 95.6 11/21/2012   PLT 233 11/21/2012    Recent Labs Lab 11/24/12 0410  NA 138  K 4.3  CL 97  CO2 31  BUN 34*   CREATININE 1.15*  CALCIUM 10.2  GLUCOSE 102*   Lab Results  Component Value Date   TROPONINI <0.30 11/22/2012    No results found for this basename: CHOL   No results found for this basename: HDL   No results found for this basename: LDLCALC   No results found for this basename: TRIG   No results found for this basename: CHOLHDL   No results found for this basename: LDLDIRECT      Radiology: No evidence of acute cardiopulmonary disease by chest x-ray EKG: Ventricular paced rhythm  FOLLOW UP PLANS AND APPOINTMENTS  Future Appointments Provider Department Dept Phone   12/16/2012 10:15 AM Marinus Maw, MD [facility] Main Office [place]) [phone]       Medication List    TAKE these medications       allopurinol 300 MG tablet  Commonly known as:  ZYLOPRIM  Take 300 mg by mouth daily. Takes along with a 100mg  tablet to equal 400mg      allopurinol 100 MG tablet  Commonly known as:  ZYLOPRIM  Take 100 mg by mouth daily. Takes along with a 300 mg tablet to equal 400 mg     CALCIUM 600+D 600-400 MG-UNIT per tablet  Generic drug:  Calcium Carbonate-Vitamin D  Take 1 tablet by mouth 2 (two) times daily.     carvedilol 12.5 MG tablet  Commonly known as:  COREG  Take 12.5 mg by mouth 2 (two) times daily with a meal.  colestipol 1 G tablet  Commonly known as:  COLESTID  Take 1 g by mouth at bedtime.     furosemide 40 MG tablet  Commonly known as:  LASIX  Take 20 mg by mouth daily.     gabapentin 800 MG tablet  Commonly known as:  NEURONTIN  Take 800 mg by mouth 3 (three) times daily.     gemfibrozil 600 MG tablet  Commonly known as:  LOPID  Take 600 mg by mouth 2 (two) times daily before a meal.     losartan 25 MG tablet  Commonly known as:  COZAAR  Take 25 mg by mouth daily.     multivitamin tablet  Take 1 tablet by mouth daily.     nitroGLYCERIN 0.4 MG SL tablet  Commonly known as:  NITROSTAT  Place 1 tablet (0.4 mg total) under the tongue  every 5 (five) minutes as needed for chest pain.     nortriptyline 25 MG capsule  Commonly known as:  PAMELOR  Take 25 mg by mouth at bedtime.     pravastatin 40 MG tablet  Commonly known as:  PRAVACHOL  Take 80 mg by mouth every evening.     spironolactone 25 MG tablet  Commonly known as:  ALDACTONE  Take 25 mg by mouth daily.     Vitamin D3 1000 UNITS Caps  Take 1 capsule by mouth daily.     warfarin 5 MG tablet  Commonly known as:  COUMADIN  Take 5-7.5 mg by mouth every morning. tue thus sat 7.5 mg and 5 mg all other days         BRING ALL MEDICATIONS WITH YOU TO FOLLOW UP APPOINTMENTS  Time spent with patient to include physician time: 40 minutes going over the plan of care with the patient and daughter and discussing with the physical therapist regarding need to home Signed: Jerlean Peralta S. 11/24/2012, 9:03 AM

## 2012-11-26 NOTE — ED Provider Notes (Signed)
Medical screening examination/treatment/procedure(s) were conducted as a shared visit with non-physician practitioner(s) and myself.  I personally evaluated the patient during the encounter  Derwood Kaplan, MD 11/26/12 1647

## 2012-12-16 ENCOUNTER — Encounter: Payer: Self-pay | Admitting: Internal Medicine

## 2012-12-16 ENCOUNTER — Ambulatory Visit (INDEPENDENT_AMBULATORY_CARE_PROVIDER_SITE_OTHER): Payer: Medicare Other | Admitting: Internal Medicine

## 2012-12-16 VITALS — BP 104/62 | HR 61 | Ht 66.0 in | Wt 238.8 lb

## 2012-12-16 DIAGNOSIS — I5021 Acute systolic (congestive) heart failure: Secondary | ICD-10-CM

## 2012-12-16 DIAGNOSIS — Z95810 Presence of automatic (implantable) cardiac defibrillator: Secondary | ICD-10-CM

## 2012-12-16 DIAGNOSIS — I509 Heart failure, unspecified: Secondary | ICD-10-CM

## 2012-12-16 LAB — ICD DEVICE OBSERVATION
AL AMPLITUDE: 2 mv
AL IMPEDENCE ICD: 456 Ohm
AL THRESHOLD: 0.75 V
BATTERY VOLTAGE: 3.1 V
RV LEAD AMPLITUDE: 19.3 mv
TZAT-0001FASTVT: 1
TZAT-0001SLOWVT: 1
TZAT-0002ATACH: NEGATIVE
TZAT-0002ATACH: NEGATIVE
TZAT-0002SLOWVT: NEGATIVE
TZAT-0012ATACH: 150 ms
TZAT-0012FASTVT: 200 ms
TZAT-0018FASTVT: NEGATIVE
TZAT-0019ATACH: 6 V
TZAT-0019ATACH: 6 V
TZAT-0019ATACH: 6 V
TZAT-0020ATACH: 1.5 ms
TZAT-0020SLOWVT: 1.5 ms
TZST-0001FASTVT: 4
TZST-0001FASTVT: 5
TZST-0001SLOWVT: 3
TZST-0001SLOWVT: 4
TZST-0001SLOWVT: 5
TZST-0002FASTVT: NEGATIVE
TZST-0002FASTVT: NEGATIVE
TZST-0002FASTVT: NEGATIVE
TZST-0002SLOWVT: NEGATIVE
TZST-0002SLOWVT: NEGATIVE
VENTRICULAR PACING ICD: 95.8 pct

## 2012-12-16 NOTE — Patient Instructions (Addendum)
Your physician wants you to follow-up in: 12 months with Dr Taylor You will receive a reminder letter in the mail two months in advance. If you don't receive a letter, please call our office to schedule the follow-up appointment.  Remote monitoring is used to monitor your Pacemaker of ICD from home. This monitoring reduces the number of office visits required to check your device to one time per year. It allows us to keep an eye on the functioning of your device to ensure it is working properly. You are scheduled for a device check from home on 03/23/13. You may send your transmission at any time that day. If you have a wireless device, the transmission will be sent automatically. After your physician reviews your transmission, you will receive a postcard with your next transmission date.    

## 2012-12-19 ENCOUNTER — Encounter: Payer: Self-pay | Admitting: Internal Medicine

## 2012-12-19 NOTE — Assessment & Plan Note (Signed)
Her medtronic device is working normally. Will recheck in several months.  

## 2012-12-19 NOTE — Progress Notes (Signed)
HPI Bianca [name] returns today for followup. She is a 79 yo woman with a h/o an ICM, chronic systolic CHF, LBBB, s/p BiV ICD implant. She has had problems with fluid overload and dietary indiscretion. She is now weighing herself. No chest pain. Still class 2 symptoms. Minimal edema. Allergies  Allergen Reactions  . Neosporin (Neomycin-Polymyxin-Gramicidin)   . Quinapril Hcl      Current Outpatient Prescriptions  Medication Sig Dispense Refill  . allopurinol (ZYLOPRIM) 100 MG tablet Take 100 mg by mouth daily. Takes along with a 300 mg tablet to equal 400 mg      . allopurinol (ZYLOPRIM) 300 MG tablet Take 300 mg by mouth daily. Takes along with a 100mg  tablet to equal 400mg       . Calcium Carbonate-Vitamin D (CALCIUM 600+D) 600-400 MG-UNIT per tablet Take 1 tablet by mouth 2 (two) times daily.      . carvedilol (COREG) 12.5 MG tablet Take 12.5 mg by mouth 2 (two) times daily with a meal.      . Cholecalciferol (VITAMIN D3) 1000 UNITS CAPS Take 1 capsule by mouth daily.      . colestipol (COLESTID) 1 G tablet Take 1 g by mouth at bedtime.      . furosemide (LASIX) 40 MG tablet Take 20 mg by mouth daily.      Marland Kitchen gabapentin (NEURONTIN) 800 MG tablet Take 800 mg by mouth 3 (three) times daily.       Marland Kitchen gemfibrozil (LOPID) 600 MG tablet Take 600 mg by mouth 2 (two) times daily before a meal.      . losartan (COZAAR) 25 MG tablet Take 25 mg by mouth daily.      . Multiple Vitamin (MULTIVITAMIN) tablet Take 1 tablet by mouth daily.      . nitroGLYCERIN (NITROSTAT) 0.4 MG SL tablet Place 1 tablet (0.4 mg total) under the tongue every 5 (five) minutes as needed for chest pain.  25 tablet  6  . nortriptyline (PAMELOR) 25 MG capsule Take 25 mg by mouth at bedtime.      . pravastatin (PRAVACHOL) 40 MG tablet Take 80 mg by mouth every evening.       Marland Kitchen spironolactone (ALDACTONE) 25 MG tablet Take 25 mg by mouth daily.      Marland Kitchen warfarin (COUMADIN) 5 MG tablet Take 5-7.5 mg by mouth every morning. tue thus  sat 7.5 mg and 5 mg all other days       No current facility-administered medications for this visit.     Past Medical History  Diagnosis Date  . Pacemaker   . ICD (implantable cardiac defibrillator) in place   . Myocardial infarction 2008  . Sleep apnea   . Arthritis   . Neuromuscular disorder   . CHF (congestive heart failure)   . Hypertension   . Dysrhythmia   . Chronic systolic heart failure   . Acute systolic heart failure     ROS:   All systems reviewed and negative except as noted in the HPI.   Past Surgical History  Procedure Laterality Date  . Cholecystectomy    . Abdominal hysterectomy    . Ankle surgery    . Insert / replace / remove pacemaker  2008  . Knee ligament reconstruction  2012  . Tonsillectomy    . Cardiac catheterization    . Eye surgery       No family history on file.   History   Social History  . Marital Status: Married  Spouse Name: N/A    Number of Children: N/A  . Years of Education: N/A   Occupational History  . Not on file.   Social History Main Topics  . Smoking status: Former Games developer  . Smokeless tobacco: Former Neurosurgeon  . Alcohol Use: No  . Drug Use: No  . Sexually Active: Not Currently   Other Topics Concern  . Not on file   Social History Narrative  . No narrative on file     BP 104/62  Pulse 61  Ht 5\' 6"  (1.676 m)  Wt 238 lb 12.8 oz (108.319 kg)  BMI 38.56 kg/m2  Physical Exam:  Well appearing 79 yo woman, NAD HEENT: Unremarkable Neck:  7 cm JVD, no thyromegally Lungs:  Clear with no wheezes. HEART:  Regular rate rhythm, no murmurs, no rubs, no clicks Abd:  soft, positive bowel sounds, no organomegally, no rebound, no guarding Ext:  2 plus pulses, no edema, no cyanosis, no clubbing Skin:  No rashes no nodules Neuro:  CN II through XII intact, motor grossly intact  EKG  DEVICE  Normal device function.  See PaceArt for details.   Assess/Plan:

## 2012-12-19 NOTE — Assessment & Plan Note (Signed)
Her symptoms our now well compensated. She will continue her current medications. I have instructed her on the importance of a low sodium diet.

## 2013-01-07 ENCOUNTER — Telehealth: Payer: Self-pay | Admitting: Nurse Practitioner

## 2013-01-07 NOTE — Telephone Encounter (Signed)
Pt called this evening stating that her MDT bedside monitor had a flashing yellow light.  The light was appearing under the telephone symbol on the machine.  I investigated this and advised that this appeared to be an issue with the monitors connections with the phone network, rather than an issue with her device.  She said that she's had to plug in some other medical equipment recently and may have unplugged the device.  I recommended that she check the plug/phone jack.  If all connections were secure, unplug from the wall and plug back in to allow for a reboot.  She will call us back if she continues to have issues.

## 2013-03-23 ENCOUNTER — Telehealth: Payer: Self-pay | Admitting: Internal Medicine

## 2013-03-23 ENCOUNTER — Ambulatory Visit (INDEPENDENT_AMBULATORY_CARE_PROVIDER_SITE_OTHER): Payer: Medicare Other | Admitting: *Deleted

## 2013-03-23 DIAGNOSIS — I509 Heart failure, unspecified: Secondary | ICD-10-CM

## 2013-03-23 DIAGNOSIS — I5021 Acute systolic (congestive) heart failure: Secondary | ICD-10-CM

## 2013-03-23 DIAGNOSIS — Z95810 Presence of automatic (implantable) cardiac defibrillator: Secondary | ICD-10-CM

## 2013-03-23 NOTE — Telephone Encounter (Signed)
New Problem  Pt having problems sending a signal.. Request a call back to make sure that she is doing this correctly.

## 2013-03-23 NOTE — Telephone Encounter (Signed)
No answer---No voicemail set up per generic recording.

## 2013-03-24 ENCOUNTER — Encounter: Payer: Self-pay | Admitting: Internal Medicine

## 2013-03-24 NOTE — Telephone Encounter (Signed)
Spoke w/pt in regards to sending transmission. Per pt once power button is pressed, transmitter does nothing. Instructed pt to call 800 number/kwm

## 2013-03-27 LAB — REMOTE ICD DEVICE
AL AMPLITUDE: 1.6 mv
FVT: 0
LV LEAD THRESHOLD: 1.625 V
RV LEAD AMPLITUDE: 18.5 mv
RV LEAD IMPEDENCE ICD: 646 Ohm
RV LEAD THRESHOLD: 0.75 V
TZAT-0001ATACH: 1
TZAT-0001ATACH: 2
TZAT-0001SLOWVT: 1
TZAT-0002ATACH: NEGATIVE
TZAT-0002ATACH: NEGATIVE
TZAT-0002FASTVT: NEGATIVE
TZAT-0002SLOWVT: NEGATIVE
TZAT-0012FASTVT: 200 ms
TZAT-0018ATACH: NEGATIVE
TZAT-0018ATACH: NEGATIVE
TZAT-0019ATACH: 6 V
TZAT-0019ATACH: 6 V
TZAT-0019FASTVT: 8 V
TZAT-0019SLOWVT: 8 V
TZAT-0020ATACH: 1.5 ms
TZAT-0020FASTVT: 1.5 ms
TZON-0003ATACH: 350 ms
TZON-0004VSLOWVT: 28
TZST-0001ATACH: 5
TZST-0001FASTVT: 5
TZST-0001SLOWVT: 3
TZST-0001SLOWVT: 4
TZST-0001SLOWVT: 5
TZST-0002ATACH: NEGATIVE
TZST-0002ATACH: NEGATIVE
TZST-0002FASTVT: NEGATIVE
TZST-0002FASTVT: NEGATIVE
TZST-0002FASTVT: NEGATIVE
TZST-0002SLOWVT: NEGATIVE
TZST-0002SLOWVT: NEGATIVE
VF: 0

## 2013-03-31 ENCOUNTER — Encounter: Payer: Self-pay | Admitting: *Deleted

## 2013-04-02 ENCOUNTER — Telehealth: Payer: Self-pay | Admitting: Cardiology

## 2013-04-02 DIAGNOSIS — I428 Other cardiomyopathies: Secondary | ICD-10-CM

## 2013-04-02 DIAGNOSIS — I509 Heart failure, unspecified: Secondary | ICD-10-CM

## 2013-04-02 DIAGNOSIS — Z95810 Presence of automatic (implantable) cardiac defibrillator: Secondary | ICD-10-CM

## 2013-04-02 NOTE — Telephone Encounter (Signed)
New message    Refill meds thru [facility] 5mg  #[id number]; pravastatin #[id number]; carvedilol #[id number]; Spironolact 25mg  #[id number]

## 2013-04-03 MED ORDER — PRAVASTATIN SODIUM 40 MG PO TABS: 80.0000 mg | ORAL_TABLET | Freq: Every evening | ORAL | Status: DC

## 2013-04-03 MED ORDER — SPIRONOLACTONE 25 MG PO TABS
25.0000 mg | ORAL_TABLET | Freq: Every day | ORAL | Status: DC
Start: 2013-04-03 — End: 2014-04-06

## 2013-04-03 MED ORDER — WARFARIN SODIUM 5 MG PO TABS: ORAL_TABLET | ORAL | Status: DC

## 2013-04-03 MED ORDER — CARVEDILOL 12.5 MG PO TABS: 12.5000 mg | ORAL_TABLET | Freq: Two times a day (BID) | ORAL | Status: DC

## 2013-04-03 NOTE — Telephone Encounter (Signed)
Refills done.

## 2013-04-08 ENCOUNTER — Ambulatory Visit (INDEPENDENT_AMBULATORY_CARE_PROVIDER_SITE_OTHER): Payer: Medicare Other | Admitting: Pharmacist

## 2013-04-08 DIAGNOSIS — I4891 Unspecified atrial fibrillation: Secondary | ICD-10-CM | POA: Insufficient documentation

## 2013-04-08 LAB — POCT INR: INR: 2.3

## 2013-05-05 ENCOUNTER — Ambulatory Visit: Payer: Medicare Other | Admitting: Cardiology

## 2013-05-20 ENCOUNTER — Ambulatory Visit (INDEPENDENT_AMBULATORY_CARE_PROVIDER_SITE_OTHER): Payer: Medicare Other | Admitting: *Deleted

## 2013-05-20 DIAGNOSIS — I4891 Unspecified atrial fibrillation: Secondary | ICD-10-CM

## 2013-06-17 ENCOUNTER — Ambulatory Visit (INDEPENDENT_AMBULATORY_CARE_PROVIDER_SITE_OTHER): Payer: Medicare Other | Admitting: Pharmacist

## 2013-06-17 DIAGNOSIS — I4891 Unspecified atrial fibrillation: Secondary | ICD-10-CM

## 2013-06-17 LAB — POCT INR: INR: 3.9

## 2013-06-26 ENCOUNTER — Encounter: Payer: Medicare Other | Admitting: *Deleted

## 2013-06-29 ENCOUNTER — Ambulatory Visit (INDEPENDENT_AMBULATORY_CARE_PROVIDER_SITE_OTHER): Payer: Medicare Other | Admitting: Pharmacist

## 2013-06-29 DIAGNOSIS — I4891 Unspecified atrial fibrillation: Secondary | ICD-10-CM

## 2013-06-29 LAB — POCT INR: INR: 2.4

## 2013-07-01 ENCOUNTER — Encounter: Payer: Self-pay | Admitting: *Deleted

## 2013-07-01 ENCOUNTER — Ambulatory Visit (INDEPENDENT_AMBULATORY_CARE_PROVIDER_SITE_OTHER): Payer: Medicare Other | Admitting: *Deleted

## 2013-07-01 DIAGNOSIS — Z95810 Presence of automatic (implantable) cardiac defibrillator: Secondary | ICD-10-CM

## 2013-07-01 DIAGNOSIS — I5022 Chronic systolic (congestive) heart failure: Secondary | ICD-10-CM

## 2013-07-01 LAB — MDC_IDC_ENUM_SESS_TYPE_REMOTE
Battery Voltage: 3.06 V
Brady Statistic AS VP Percent: 98.3 %
Brady Statistic RA Percent Paced: 0.99 %
HIGH POWER IMPEDANCE MEASURED VALUE: 266 Ohm
HIGH POWER IMPEDANCE MEASURED VALUE: 50 Ohm
HIGH POWER IMPEDANCE MEASURED VALUE: 65 Ohm
HighPow Impedance: 570 Ohm
Lead Channel Impedance Value: 665 Ohm
Lead Channel Impedance Value: 722 Ohm
Lead Channel Pacing Threshold Amplitude: 0.75 V
Lead Channel Pacing Threshold Amplitude: 2 V
Lead Channel Pacing Threshold Pulse Width: 0.4 ms
Lead Channel Pacing Threshold Pulse Width: 1 ms
Lead Channel Sensing Intrinsic Amplitude: 18 mV
Lead Channel Sensing Intrinsic Amplitude: 18 mV
Lead Channel Setting Pacing Amplitude: 2 V
Lead Channel Setting Pacing Amplitude: 2.5 V
Lead Channel Setting Pacing Pulse Width: 0.4 ms
Lead Channel Setting Pacing Pulse Width: 1 ms
MDC IDC MSMT LEADCHNL LV IMPEDANCE VALUE: 437 Ohm
MDC IDC MSMT LEADCHNL LV IMPEDANCE VALUE: 969 Ohm
MDC IDC MSMT LEADCHNL RA IMPEDANCE VALUE: 494 Ohm
MDC IDC MSMT LEADCHNL RA PACING THRESHOLD AMPLITUDE: 0.875 V
MDC IDC MSMT LEADCHNL RA PACING THRESHOLD PULSEWIDTH: 0.4 ms
MDC IDC MSMT LEADCHNL RA SENSING INTR AMPL: 2 mV
MDC IDC MSMT LEADCHNL RA SENSING INTR AMPL: 2 mV
MDC IDC SESS DTM: 20150114214046
MDC IDC SET LEADCHNL RV PACING AMPLITUDE: 2.5 V
MDC IDC SET LEADCHNL RV SENSING SENSITIVITY: 0.3 mV
MDC IDC SET ZONE DETECTION INTERVAL: 300 ms
MDC IDC SET ZONE DETECTION INTERVAL: 370 ms
MDC IDC STAT BRADY AP VP PERCENT: 0.98 %
MDC IDC STAT BRADY AP VS PERCENT: 0.01 %
MDC IDC STAT BRADY AS VS PERCENT: 0.72 %
MDC IDC STAT BRADY RV PERCENT PACED: 99.27 %
Zone Setting Detection Interval: 350 ms
Zone Setting Detection Interval: 360 ms

## 2013-07-14 ENCOUNTER — Encounter: Payer: Self-pay | Admitting: *Deleted

## 2013-07-16 ENCOUNTER — Encounter: Payer: Self-pay | Admitting: Cardiology

## 2013-07-16 ENCOUNTER — Ambulatory Visit (INDEPENDENT_AMBULATORY_CARE_PROVIDER_SITE_OTHER): Payer: Medicare Other | Admitting: Cardiology

## 2013-07-16 ENCOUNTER — Ambulatory Visit (INDEPENDENT_AMBULATORY_CARE_PROVIDER_SITE_OTHER): Payer: Medicare Other

## 2013-07-16 VITALS — BP 117/73 | HR 89 | Ht 66.0 in | Wt 245.4 lb

## 2013-07-16 DIAGNOSIS — M179 Osteoarthritis of knee, unspecified: Secondary | ICD-10-CM | POA: Insufficient documentation

## 2013-07-16 DIAGNOSIS — Z95810 Presence of automatic (implantable) cardiac defibrillator: Secondary | ICD-10-CM

## 2013-07-16 DIAGNOSIS — Z7901 Long term (current) use of anticoagulants: Secondary | ICD-10-CM

## 2013-07-16 DIAGNOSIS — Z5181 Encounter for therapeutic drug level monitoring: Secondary | ICD-10-CM

## 2013-07-16 DIAGNOSIS — Z7189 Other specified counseling: Secondary | ICD-10-CM | POA: Insufficient documentation

## 2013-07-16 DIAGNOSIS — I5022 Chronic systolic (congestive) heart failure: Secondary | ICD-10-CM

## 2013-07-16 DIAGNOSIS — IMO0002 Reserved for concepts with insufficient information to code with codable children: Secondary | ICD-10-CM

## 2013-07-16 DIAGNOSIS — M1710 Unilateral primary osteoarthritis, unspecified knee: Secondary | ICD-10-CM

## 2013-07-16 DIAGNOSIS — I5023 Acute on chronic systolic (congestive) heart failure: Secondary | ICD-10-CM | POA: Insufficient documentation

## 2013-07-16 DIAGNOSIS — I4891 Unspecified atrial fibrillation: Secondary | ICD-10-CM

## 2013-07-16 LAB — CBC WITH DIFFERENTIAL/PLATELET
BASOS ABS: 0 10*3/uL (ref 0.0–0.1)
Basophils Relative: 0.5 % (ref 0.0–3.0)
EOS ABS: 0.8 10*3/uL — AB (ref 0.0–0.7)
Eosinophils Relative: 8.5 % — ABNORMAL HIGH (ref 0.0–5.0)
HEMATOCRIT: 41.1 % (ref 36.0–46.0)
Hemoglobin: 13.3 g/dL (ref 12.0–15.0)
LYMPHS ABS: 3.3 10*3/uL (ref 0.7–4.0)
Lymphocytes Relative: 35.1 % (ref 12.0–46.0)
MCHC: 32.5 g/dL (ref 30.0–36.0)
MCV: 99.8 fl (ref 78.0–100.0)
MONOS PCT: 8.2 % (ref 3.0–12.0)
Monocytes Absolute: 0.8 10*3/uL (ref 0.1–1.0)
Neutro Abs: 4.5 10*3/uL (ref 1.4–7.7)
Neutrophils Relative %: 47.7 % (ref 43.0–77.0)
PLATELETS: 256 10*3/uL (ref 150.0–400.0)
RBC: 4.12 Mil/uL (ref 3.87–5.11)
RDW: 14.5 % (ref 11.5–14.6)
WBC: 9.5 10*3/uL (ref 4.5–10.5)

## 2013-07-16 LAB — BASIC METABOLIC PANEL
BUN: 18 mg/dL (ref 6–23)
CALCIUM: 10.1 mg/dL (ref 8.4–10.5)
CO2: 31 meq/L (ref 19–32)
Chloride: 97 mEq/L (ref 96–112)
Creatinine, Ser: 1.2 mg/dL (ref 0.4–1.2)
GFR: 48.42 mL/min — AB (ref >60.00)
GLUCOSE: 98 mg/dL (ref 70–99)
POTASSIUM: 4 meq/L (ref 3.5–5.1)
Sodium: 140 mEq/L (ref 135–145)

## 2013-07-16 LAB — POCT INR: INR: 2.6

## 2013-07-16 NOTE — Patient Instructions (Signed)
Your physician recommends that you continue on your current medications as directed. Please refer to the Current Medication list given to you today.  Your physician recommends that you have labs today: BMET, CBC  Your physician wants you to follow-up in: 6 months with Dr. Skains. You will receive a reminder letter in the mail two months in advance. If you don't receive a letter, please call our office to schedule the follow-up appointment.   

## 2013-07-16 NOTE — Progress Notes (Signed)
[facility]. [address], [address] [place], [place]  [address] Phone: [phone] Fax:  [phone]  Date:  07/16/2013   ID:  Bianca, [name] 06/13/1933, MRN [id number]  PCP:  Horton Finer, MD   History of Present Illness: Bianca [name] is a 80 y.o. female with nonischemic cardiomyopathy, hx of atrial fibrillation-currently with chronic anticoagulation, hx of Chronic kidney disease-stage III, chronic systolic heart failure-status post Bi- ventricular ICD-followed by Dr. Lovena Le. No defibrillations. EF 35%. Symptoms have been very stable. NYHA class II except for recent hospitalization for heart failure she was discharged on 11/24/12 with primary diagnosis of acute on chronic systolic heart failure. She was admitted with fluid overload, shortness of breath and diuresed successfully. She was also treated for urinary tract infection by ER physician. She did have periods of hypercarbia with CO2 above 50 with hypersomnolence and has had untreated sleep apnea. Still with mild SOB when talking but not severe. Doing well with CPAP.   Husband has dementia.     Wt Readings from Last 3 Encounters:  07/16/13 245 lb 6.4 oz (111.313 kg)  12/16/12 238 lb 12.8 oz (108.319 kg)  11/24/12 239 lb 13.8 oz (108.8 kg)     Past Medical History  Diagnosis Date  . Pacemaker   . ICD (implantable cardiac defibrillator) in place   . Myocardial infarction 2008  . Sleep apnea   . Arthritis   . Neuromuscular disorder   . CHF (congestive heart failure)   . Hypertension   . Dysrhythmia   . Chronic systolic heart failure   . Acute systolic heart failure     Past Surgical History  Procedure Laterality Date  . Cholecystectomy    . Abdominal hysterectomy    . Ankle surgery    . Insert / replace / remove pacemaker  2008  . Knee ligament reconstruction  2012  . Tonsillectomy    . Cardiac catheterization    . Eye surgery      Current Outpatient Prescriptions  Medication Sig Dispense Refill    . allopurinol (ZYLOPRIM) 100 MG tablet Take 100 mg by mouth daily. Takes along with a 300 mg tablet to equal 400 mg      . allopurinol (ZYLOPRIM) 300 MG tablet Take 300 mg by mouth daily. Takes along with a 100mg  tablet to equal 400mg       . Calcium Carbonate-Vitamin D (CALCIUM 600+D) 600-400 MG-UNIT per tablet Take 1 tablet by mouth 2 (two) times daily.      . carvedilol (COREG) 12.5 MG tablet Take 1 tablet (12.5 mg total) by mouth 2 (two) times daily with a meal.  180 tablet  3  . Cholecalciferol (VITAMIN D3) 1000 UNITS CAPS Take 1 capsule by mouth daily.      . furosemide (LASIX) 40 MG tablet Take 20 mg by mouth daily.      Marland Kitchen gabapentin (NEURONTIN) 800 MG tablet Take 800 mg by mouth 3 (three) times daily.       Marland Kitchen gemfibrozil (LOPID) 600 MG tablet Take 600 mg by mouth 2 (two) times daily before a meal.      . losartan (COZAAR) 25 MG tablet Take 25 mg by mouth daily.      . Multiple Vitamin (MULTIVITAMIN) tablet Take 1 tablet by mouth daily.      . nitroGLYCERIN (NITROSTAT) 0.4 MG SL tablet Place 1 tablet (0.4 mg total) under the tongue every 5 (five) minutes as needed for chest pain.  25  tablet  6  . nortriptyline (PAMELOR) 25 MG capsule Take 25 mg by mouth at bedtime.      . pravastatin (PRAVACHOL) 40 MG tablet Take 2 tablets (80 mg total) by mouth every evening.  180 tablet  3  . spironolactone (ALDACTONE) 25 MG tablet Take 1 tablet (25 mg total) by mouth daily.  90 tablet  3  . warfarin (COUMADIN) 5 MG tablet Take 1.5 tablets Tuesday, Thursday, Saturday, and 1 tablet on Monday, Wednesday, Friday, Sunday; or as directed by [facility]  125 tablet  1  . colestipol (COLESTID) 1 G tablet Take 1 g by mouth at bedtime.       No current facility-administered medications for this visit.    Allergies:    Allergies  Allergen Reactions  . Neosporin [Neomycin-Polymyxin-Gramicidin]   . Quinapril Hcl     Social History:  The patient  reports that she has quit smoking. She has quit using  smokeless tobacco. She reports that she does not drink alcohol or use illicit drugs.   ROS:  Please see the history of present illness.   Denies any bleeding, syncope, orthopnea, PND    PHYSICAL EXAM: VS:  BP 117/73  Pulse 89  Ht 5\' 6"  (1.676 m)  Wt 245 lb 6.4 oz (111.313 kg)  BMI 39.63 kg/m2 Well nourished, well developed, in no acute distress HEENT: normal Neck: no JVD Cardiac:  normal S1, S2; RRR; no murmur Lungs:  clear to auscultation bilaterally, no wheezing, rhonchi or rales Abd: soft, nontender, no hepatomegalyobese Ext: no edema Skin: warm and dry Neuro: no focal abnormalities noted  EKG:  None today.     ASSESSMENT AND PLAN:  1. Atrial fibrillation-currently controlled, chronic anticoagulation in place. CHADS-VASC - 5. 2. Chronic anticoagulation-kidney with warfarin, INR goal 2-3. 3. Nonischemic Cardiomyopathy-EF 35%. Medications reviewed. Currently well compensated. She has been well compensated for years. Checking basic metabolic profile today. NYHA 2. 4. History of ICD implantation - functioning well. 5. Right knee osteoarthrosis  6. Morbid obesity-in relation to cardiomyopathy, hyperlipidemia. Continue to encourage weight loss. Challenging for her since she is taking care of her husband who has dementia 7. 6 month follow  Signed, Candee Furbish, MD [facility]  07/16/2013 11:35 AM

## 2013-08-03 ENCOUNTER — Encounter: Payer: Self-pay | Admitting: Internal Medicine

## 2013-08-03 ENCOUNTER — Other Ambulatory Visit: Payer: Self-pay

## 2013-08-03 ENCOUNTER — Telehealth: Payer: Self-pay | Admitting: Cardiology

## 2013-08-03 DIAGNOSIS — I509 Heart failure, unspecified: Secondary | ICD-10-CM

## 2013-08-03 DIAGNOSIS — I428 Other cardiomyopathies: Secondary | ICD-10-CM

## 2013-08-03 MED ORDER — CARVEDILOL 12.5 MG PO TABS: 12.5000 mg | ORAL_TABLET | Freq: Two times a day (BID) | ORAL | Status: DC

## 2013-08-03 NOTE — Telephone Encounter (Signed)
Walk in Pt Form " Cardiovascular" Form dropped off For completion gave to [facility]

## 2013-08-06 ENCOUNTER — Telehealth: Payer: Self-pay | Admitting: Cardiology

## 2013-08-06 NOTE — Telephone Encounter (Signed)
DMV form for cardiology done.

## 2013-08-07 ENCOUNTER — Telehealth: Payer: Self-pay | Admitting: Cardiology

## 2013-08-07 NOTE — Telephone Encounter (Signed)
Place in mail to be mailed to the patient, sent to medical records to be scanned.

## 2013-08-07 NOTE — Telephone Encounter (Signed)
New message    Refill pravastatin-90da supply----pt got a letter from [employer] saying it needed prior authorization.   pls call it in to [employer]

## 2013-08-10 ENCOUNTER — Other Ambulatory Visit: Payer: Self-pay | Admitting: *Deleted

## 2013-08-10 DIAGNOSIS — I509 Heart failure, unspecified: Secondary | ICD-10-CM

## 2013-08-10 DIAGNOSIS — I428 Other cardiomyopathies: Secondary | ICD-10-CM

## 2013-08-10 MED ORDER — CARVEDILOL 12.5 MG PO TABS: 12.5000 mg | ORAL_TABLET | Freq: Two times a day (BID) | ORAL | Status: DC

## 2013-08-10 NOTE — Telephone Encounter (Signed)
PA to [facility] for pravastatin 40 mg 2 tablets daily

## 2013-08-15 ENCOUNTER — Other Ambulatory Visit: Payer: Self-pay | Admitting: Cardiology

## 2013-08-17 ENCOUNTER — Ambulatory Visit (INDEPENDENT_AMBULATORY_CARE_PROVIDER_SITE_OTHER): Payer: Medicare Other | Admitting: Pharmacist

## 2013-08-17 DIAGNOSIS — Z5181 Encounter for therapeutic drug level monitoring: Secondary | ICD-10-CM

## 2013-08-17 DIAGNOSIS — I4891 Unspecified atrial fibrillation: Secondary | ICD-10-CM

## 2013-08-17 LAB — POCT INR: INR: 1.6

## 2013-08-24 NOTE — Telephone Encounter (Signed)
PA or quantity limit for prevastatin approved through 08/10/2014

## 2013-08-28 ENCOUNTER — Telehealth: Payer: Self-pay | Admitting: Cardiology

## 2013-08-28 NOTE — Telephone Encounter (Signed)
New Message  Pt states she had a form that was completed by Dr. Marlou Porch and Mailed to the [facility]. Pt states the DMV is claiming they have not received the letter. She is requesting a call back to confirm that the the document was mailed. Please assist

## 2013-08-28 NOTE — Telephone Encounter (Signed)
Spoke with patient.  Requests that copy of letter sent to [facility] be faxed to them [phone] attn: [email or web address]. Informed her it has already been mailed.  They have not received it.  The form is scanned into medical records.

## 2013-08-31 ENCOUNTER — Ambulatory Visit (INDEPENDENT_AMBULATORY_CARE_PROVIDER_SITE_OTHER): Payer: Medicare Other | Admitting: *Deleted

## 2013-08-31 DIAGNOSIS — I4891 Unspecified atrial fibrillation: Secondary | ICD-10-CM

## 2013-08-31 DIAGNOSIS — Z5181 Encounter for therapeutic drug level monitoring: Secondary | ICD-10-CM

## 2013-08-31 LAB — POCT INR: INR: 2.7

## 2013-09-02 ENCOUNTER — Other Ambulatory Visit: Payer: Self-pay

## 2013-09-02 DIAGNOSIS — Z1231 Encounter for screening mammogram for malignant neoplasm of breast: Secondary | ICD-10-CM

## 2013-09-03 ENCOUNTER — Telehealth: Payer: Self-pay | Admitting: Cardiology

## 2013-09-03 NOTE — Telephone Encounter (Signed)
New message      Please fas DMV form to [phone].  Patient said DMV has not received the form.  This is a different fax number

## 2013-09-17 ENCOUNTER — Other Ambulatory Visit: Payer: Self-pay | Admitting: *Deleted

## 2013-09-17 DIAGNOSIS — Z79899 Other long term (current) drug therapy: Secondary | ICD-10-CM

## 2013-09-17 DIAGNOSIS — E7800 Pure hypercholesterolemia, unspecified: Secondary | ICD-10-CM

## 2013-09-30 ENCOUNTER — Ambulatory Visit (INDEPENDENT_AMBULATORY_CARE_PROVIDER_SITE_OTHER): Payer: Medicare Other | Admitting: Pharmacist

## 2013-09-30 ENCOUNTER — Other Ambulatory Visit (INDEPENDENT_AMBULATORY_CARE_PROVIDER_SITE_OTHER): Payer: Medicare Other

## 2013-09-30 DIAGNOSIS — Z5181 Encounter for therapeutic drug level monitoring: Secondary | ICD-10-CM

## 2013-09-30 DIAGNOSIS — Z79899 Other long term (current) drug therapy: Secondary | ICD-10-CM

## 2013-09-30 DIAGNOSIS — I4891 Unspecified atrial fibrillation: Secondary | ICD-10-CM

## 2013-09-30 DIAGNOSIS — E7800 Pure hypercholesterolemia, unspecified: Secondary | ICD-10-CM

## 2013-09-30 LAB — LIPID PANEL
CHOL/HDL RATIO: 4
CHOLESTEROL: 163 mg/dL (ref 0–200)
HDL: 40 mg/dL (ref >39.00)
LDL CALC: 73 mg/dL (ref 0–99)
Triglycerides: 252 mg/dL — ABNORMAL HIGH (ref 0.0–149.0)
VLDL: 50.4 mg/dL — AB (ref 0.0–40.0)

## 2013-09-30 LAB — HEPATIC FUNCTION PANEL
ALK PHOS: 44 U/L (ref 39–117)
ALT: 16 U/L (ref 0–35)
AST: 21 U/L (ref 0–37)
Albumin: 4.3 g/dL (ref 3.5–5.2)
BILIRUBIN DIRECT: 0 mg/dL (ref 0.0–0.3)
BILIRUBIN TOTAL: 0.8 mg/dL (ref 0.3–1.2)
TOTAL PROTEIN: 7.6 g/dL (ref 6.0–8.3)

## 2013-09-30 LAB — POCT INR: INR: 3

## 2013-10-01 ENCOUNTER — Encounter: Payer: Self-pay | Admitting: Internal Medicine

## 2013-10-01 ENCOUNTER — Ambulatory Visit (INDEPENDENT_AMBULATORY_CARE_PROVIDER_SITE_OTHER): Payer: Medicare Other | Admitting: *Deleted

## 2013-10-01 DIAGNOSIS — I5021 Acute systolic (congestive) heart failure: Secondary | ICD-10-CM

## 2013-10-01 DIAGNOSIS — I5022 Chronic systolic (congestive) heart failure: Secondary | ICD-10-CM

## 2013-10-01 DIAGNOSIS — I509 Heart failure, unspecified: Secondary | ICD-10-CM

## 2013-10-01 DIAGNOSIS — I4891 Unspecified atrial fibrillation: Secondary | ICD-10-CM

## 2013-10-01 LAB — MDC_IDC_ENUM_SESS_TYPE_REMOTE
Brady Statistic AP VS Percent: 0.01 %
Brady Statistic AS VS Percent: 0.89 %
Brady Statistic RA Percent Paced: 0.53 %
HIGH POWER IMPEDANCE MEASURED VALUE: 266 Ohm
HIGH POWER IMPEDANCE MEASURED VALUE: 51 Ohm
HIGH POWER IMPEDANCE MEASURED VALUE: 67 Ohm
HighPow Impedance: 589 Ohm
Lead Channel Impedance Value: 988 Ohm
Lead Channel Pacing Threshold Amplitude: 1 V
Lead Channel Pacing Threshold Amplitude: 1.875 V
Lead Channel Pacing Threshold Pulse Width: 0.4 ms
Lead Channel Pacing Threshold Pulse Width: 1 ms
Lead Channel Sensing Intrinsic Amplitude: 16.25 mV
Lead Channel Sensing Intrinsic Amplitude: 16.25 mV
Lead Channel Setting Pacing Amplitude: 2.5 V
Lead Channel Setting Pacing Amplitude: 2.5 V
Lead Channel Setting Pacing Pulse Width: 0.4 ms
Lead Channel Setting Pacing Pulse Width: 1 ms
MDC IDC MSMT BATTERY VOLTAGE: 3.02 V
MDC IDC MSMT LEADCHNL LV IMPEDANCE VALUE: 456 Ohm
MDC IDC MSMT LEADCHNL LV IMPEDANCE VALUE: 722 Ohm
MDC IDC MSMT LEADCHNL RA IMPEDANCE VALUE: 513 Ohm
MDC IDC MSMT LEADCHNL RA PACING THRESHOLD PULSEWIDTH: 0.4 ms
MDC IDC MSMT LEADCHNL RA SENSING INTR AMPL: 1.875 mV
MDC IDC MSMT LEADCHNL RA SENSING INTR AMPL: 1.875 mV
MDC IDC MSMT LEADCHNL RV IMPEDANCE VALUE: 703 Ohm
MDC IDC MSMT LEADCHNL RV PACING THRESHOLD AMPLITUDE: 0.625 V
MDC IDC SESS DTM: 20150416202313
MDC IDC SET LEADCHNL RA PACING AMPLITUDE: 2 V
MDC IDC SET LEADCHNL RV SENSING SENSITIVITY: 0.3 mV
MDC IDC SET ZONE DETECTION INTERVAL: 300 ms
MDC IDC SET ZONE DETECTION INTERVAL: 360 ms
MDC IDC STAT BRADY AP VP PERCENT: 0.51 %
MDC IDC STAT BRADY AS VP PERCENT: 98.58 %
MDC IDC STAT BRADY RV PERCENT PACED: 99.09 %
Zone Setting Detection Interval: 350 ms
Zone Setting Detection Interval: 370 ms

## 2013-10-09 ENCOUNTER — Ambulatory Visit
Admission: RE | Admit: 2013-10-09 | Discharge: 2013-10-09 | Disposition: A | Discharge status: Home / Self Care | Payer: Medicare Other | Source: Ambulatory Visit

## 2013-10-09 DIAGNOSIS — Z1231 Encounter for screening mammogram for malignant neoplasm of breast: Secondary | ICD-10-CM

## 2013-10-13 NOTE — Telephone Encounter (Signed)
Called patient, left message to call the office

## 2013-10-14 ENCOUNTER — Telehealth: Payer: Self-pay

## 2013-10-14 DIAGNOSIS — I509 Heart failure, unspecified: Secondary | ICD-10-CM

## 2013-10-14 DIAGNOSIS — I428 Other cardiomyopathies: Secondary | ICD-10-CM

## 2013-10-14 MED ORDER — WARFARIN SODIUM 5 MG PO TABS: ORAL_TABLET | ORAL | Status: DC

## 2013-10-14 NOTE — Telephone Encounter (Signed)
Refill sent.

## 2013-10-15 NOTE — Telephone Encounter (Signed)
Faxed to DMV and mailed to patient

## 2013-10-19 ENCOUNTER — Ambulatory Visit: Payer: Medicare Other | Attending: Internal Medicine | Admitting: Rehabilitative and Restorative Service Providers"

## 2013-10-19 DIAGNOSIS — IMO0001 Reserved for inherently not codable concepts without codable children: Secondary | ICD-10-CM | POA: Insufficient documentation

## 2013-10-19 DIAGNOSIS — R269 Unspecified abnormalities of gait and mobility: Secondary | ICD-10-CM | POA: Insufficient documentation

## 2013-10-21 ENCOUNTER — Encounter: Payer: Self-pay | Admitting: Cardiology

## 2013-10-23 ENCOUNTER — Encounter: Payer: Medicare Other | Admitting: Physical Therapy

## 2013-10-26 ENCOUNTER — Ambulatory Visit: Payer: Medicare Other | Admitting: Physical Therapy

## 2013-10-28 ENCOUNTER — Ambulatory Visit (INDEPENDENT_AMBULATORY_CARE_PROVIDER_SITE_OTHER): Payer: Medicare Other | Admitting: Pharmacist

## 2013-10-28 DIAGNOSIS — Z5181 Encounter for therapeutic drug level monitoring: Secondary | ICD-10-CM

## 2013-10-28 DIAGNOSIS — I4891 Unspecified atrial fibrillation: Secondary | ICD-10-CM

## 2013-10-28 LAB — POCT INR: INR: 2.4

## 2013-11-12 ENCOUNTER — Ambulatory Visit: Payer: Medicare Other | Admitting: Physical Therapy

## 2013-11-16 ENCOUNTER — Ambulatory Visit: Payer: Medicare Other | Admitting: Physical Therapy

## 2013-11-23 ENCOUNTER — Ambulatory Visit: Payer: Medicare Other | Admitting: Physical Therapy

## 2013-11-26 ENCOUNTER — Ambulatory Visit
Admission: RE | Admit: 2013-11-26 | Discharge: 2013-11-26 | Disposition: A | Discharge status: Home / Self Care | Payer: Medicare Other | Source: Ambulatory Visit | Attending: Internal Medicine | Admitting: Internal Medicine

## 2013-11-26 ENCOUNTER — Other Ambulatory Visit: Payer: Self-pay | Admitting: Internal Medicine

## 2013-11-26 DIAGNOSIS — M25569 Pain in unspecified knee: Secondary | ICD-10-CM

## 2013-11-30 ENCOUNTER — Ambulatory Visit: Payer: Medicare Other | Admitting: Physical Therapy

## 2013-12-07 ENCOUNTER — Ambulatory Visit (INDEPENDENT_AMBULATORY_CARE_PROVIDER_SITE_OTHER): Payer: Medicare Other | Admitting: Pharmacist

## 2013-12-07 ENCOUNTER — Ambulatory Visit: Payer: Medicare Other | Admitting: Physical Therapy

## 2013-12-07 DIAGNOSIS — Z5181 Encounter for therapeutic drug level monitoring: Secondary | ICD-10-CM

## 2013-12-07 DIAGNOSIS — I4891 Unspecified atrial fibrillation: Secondary | ICD-10-CM

## 2013-12-07 LAB — POCT INR: INR: 2.5

## 2014-01-19 ENCOUNTER — Ambulatory Visit (INDEPENDENT_AMBULATORY_CARE_PROVIDER_SITE_OTHER): Payer: Medicare Other | Admitting: Pharmacist

## 2014-01-19 ENCOUNTER — Ambulatory Visit (INDEPENDENT_AMBULATORY_CARE_PROVIDER_SITE_OTHER): Payer: Medicare Other | Admitting: Cardiology

## 2014-01-19 ENCOUNTER — Encounter: Payer: Self-pay | Admitting: Cardiology

## 2014-01-19 VITALS — BP 118/62 | HR 76 | Ht 66.0 in | Wt 244.0 lb

## 2014-01-19 DIAGNOSIS — I509 Heart failure, unspecified: Secondary | ICD-10-CM

## 2014-01-19 DIAGNOSIS — Z95810 Presence of automatic (implantable) cardiac defibrillator: Secondary | ICD-10-CM

## 2014-01-19 DIAGNOSIS — Z5181 Encounter for therapeutic drug level monitoring: Secondary | ICD-10-CM

## 2014-01-19 DIAGNOSIS — I4891 Unspecified atrial fibrillation: Secondary | ICD-10-CM

## 2014-01-19 DIAGNOSIS — I5022 Chronic systolic (congestive) heart failure: Secondary | ICD-10-CM

## 2014-01-19 DIAGNOSIS — I482 Chronic atrial fibrillation, unspecified: Secondary | ICD-10-CM

## 2014-01-19 LAB — POCT INR: INR: 4.1

## 2014-01-19 NOTE — Patient Instructions (Signed)
The current medical regimen is effective;  continue present plan and medications.  Follow up in 6 months with Dr. Skains.  You will receive a letter in the mail 2 months before you are due.  Please call us when you receive this letter to schedule your follow up appointment.  

## 2014-01-19 NOTE — Progress Notes (Signed)
[facility]. [address], [address], [place]  [address] Phone: [phone] Fax:  [phone]  Date:  01/19/2014   ID:  Bianca [name], DOB [birth year], MRN [id number]  PCP:  Horton Finer, MD   History of Present Illness: Bianca [name] is a 80 y.o. female with nonischemic cardiomyopathy, hx of atrial fibrillation-currently with chronic anticoagulation, hx of Chronic kidney disease-stage III, chronic systolic heart failure-status post Bi- ventricular ICD-followed by Dr. Lovena Le. No defibrillations. EF 35%. Symptoms have been very stable. NYHA class II except for recent hospitalization for heart failure she was discharged on 11/24/12 with primary diagnosis of acute on chronic systolic heart failure. She was admitted with fluid overload, shortness of breath and diuresed successfully. She was also treated for urinary tract infection by ER physician. She did have periods of hypercarbia with CO2 above 50 with hypersomnolence and has had untreated sleep apnea. Still with mild SOB when talking but not severe. Doing well with CPAP.   Husband has dementia. At [facility].     Wt Readings from Last 3 Encounters:  01/19/14 244 lb (110.678 kg)  07/16/13 245 lb 6.4 oz (111.313 kg)  12/16/12 238 lb 12.8 oz (108.319 kg)     Past Medical History  Diagnosis Date  . Pacemaker   . ICD (implantable cardiac defibrillator) in place   . Myocardial infarction 2008  . Sleep apnea   . Arthritis   . Neuromuscular disorder   . CHF (congestive heart failure)   . Hypertension   . Dysrhythmia   . Chronic systolic heart failure   . Acute systolic heart failure     Past Surgical History  Procedure Laterality Date  . Cholecystectomy    . Abdominal hysterectomy    . Ankle surgery    . Insert / replace / remove pacemaker  2008  . Knee ligament reconstruction  2012  . Tonsillectomy    . Cardiac catheterization    . Eye surgery      Current Outpatient Prescriptions  Medication Sig  Dispense Refill  . allopurinol (ZYLOPRIM) 100 MG tablet Take 100 mg by mouth daily. Takes along with a 300 mg tablet to equal 400 mg      . allopurinol (ZYLOPRIM) 300 MG tablet Take 300 mg by mouth daily. Takes along with a 100mg  tablet to equal 400mg       . Calcium Carbonate-Vitamin D (CALCIUM 600+D) 600-400 MG-UNIT per tablet Take 1 tablet by mouth 2 (two) times daily.      . carvedilol (COREG) 12.5 MG tablet Take 1 tablet (12.5 mg total) by mouth 2 (two) times daily with a meal.  180 tablet  1  . carvedilol (COREG) 12.5 MG tablet TAKE 1 TABLET (12.5 MG TOTAL) BY MOUTH 2 (TWO) TIMES DAILY WITH A MEAL.  30 tablet  0  . Cholecalciferol (VITAMIN D3) 1000 UNITS CAPS Take 1 capsule by mouth daily.      . colestipol (COLESTID) 1 G tablet Take 1 g by mouth at bedtime.      . furosemide (LASIX) 40 MG tablet Take 20 mg by mouth daily.      Marland Kitchen gabapentin (NEURONTIN) 800 MG tablet Take 800 mg by mouth 3 (three) times daily.       Marland Kitchen gemfibrozil (LOPID) 600 MG tablet Take 600 mg by mouth 2 (two) times daily before a meal.      . losartan (COZAAR) 25 MG tablet Take 25 mg by mouth daily.      Marland Kitchen  Multiple Vitamin (MULTIVITAMIN) tablet Take 1 tablet by mouth daily.      . nitroGLYCERIN (NITROSTAT) 0.4 MG SL tablet Place 1 tablet (0.4 mg total) under the tongue every 5 (five) minutes as needed for chest pain.  25 tablet  6  . nortriptyline (PAMELOR) 25 MG capsule Take 25 mg by mouth at bedtime.      . pravastatin (PRAVACHOL) 40 MG tablet Take 2 tablets (80 mg total) by mouth every evening.  180 tablet  3  . spironolactone (ALDACTONE) 25 MG tablet Take 1 tablet (25 mg total) by mouth daily.  90 tablet  3  . warfarin (COUMADIN) 5 MG tablet Take as directed by [facility]  125 tablet  1   No current facility-administered medications for this visit.    Allergies:    Allergies  Allergen Reactions  . Neosporin [Neomycin-Polymyxin-Gramicidin]   . Quinapril Hcl     Social History:  The patient  reports that  she has quit smoking. She has quit using smokeless tobacco. She reports that she does not drink alcohol or use illicit drugs.   ROS:  Please see the history of present illness.   Denies any bleeding, syncope, orthopnea, PND    PHYSICAL EXAM: VS:  BP 118/62  Pulse 76  Ht 5\' 6"  (1.676 m)  Wt 244 lb (110.678 kg)  BMI 39.40 kg/m2 Well nourished, well developed, in no acute distress HEENT: normal Neck: no JVD Cardiac:  normal S1, S2; RRR; no murmur Lungs:  clear to auscultation bilaterally, no wheezing, rhonchi or rales Abd: soft, nontender, no hepatomegalyobese Ext: no edemaright knee brace Skin: warm and dry Neuro: no focal abnormalities noted  EKG:  None today.     ASSESSMENT AND PLAN:  1. Atrial fibrillation-currently controlled, chronic anticoagulation in place. CHADS-VASC - 5. 2. Chronic anticoagulation-kidney with warfarin, INR goal 2-3. 3. Nonischemic Cardiomyopathy-EF 35%. Medications reviewed. Currently well compensated. She has been well compensated for years. Checking basic metabolic profile today. NYHA 2. 4. History of ICD implantation - functioning well. 5. Right knee osteoarthrosis  6. Morbid obesity-in relation to cardiomyopathy, hyperlipidemia. Continue to encourage weight loss. Challenging for her since she is taking care of her husband who has dementia 7. 6 month follow  Signed, Candee Furbish, MD [facility]  01/19/2014 11:45 AM

## 2014-01-21 ENCOUNTER — Encounter: Payer: Medicare Other | Admitting: Internal Medicine

## 2014-02-02 ENCOUNTER — Ambulatory Visit (INDEPENDENT_AMBULATORY_CARE_PROVIDER_SITE_OTHER): Payer: Medicare Other | Admitting: Internal Medicine

## 2014-02-02 ENCOUNTER — Encounter: Payer: Self-pay | Admitting: Internal Medicine

## 2014-02-02 ENCOUNTER — Ambulatory Visit (INDEPENDENT_AMBULATORY_CARE_PROVIDER_SITE_OTHER): Payer: Medicare Other | Admitting: *Deleted

## 2014-02-02 VITALS — BP 126/79 | HR 76 | Ht 65.0 in | Wt 242.0 lb

## 2014-02-02 DIAGNOSIS — Z95810 Presence of automatic (implantable) cardiac defibrillator: Secondary | ICD-10-CM

## 2014-02-02 DIAGNOSIS — Z5181 Encounter for therapeutic drug level monitoring: Secondary | ICD-10-CM

## 2014-02-02 DIAGNOSIS — I5022 Chronic systolic (congestive) heart failure: Secondary | ICD-10-CM

## 2014-02-02 DIAGNOSIS — I4891 Unspecified atrial fibrillation: Secondary | ICD-10-CM

## 2014-02-02 LAB — POCT INR: INR: 2.1

## 2014-02-02 NOTE — Assessment & Plan Note (Signed)
Her symptoms are class 2 and I have encouraged her to watch her fluid and sodium intake. No change in medications.

## 2014-02-02 NOTE — Patient Instructions (Signed)
Your physician wants you to follow-up in: [place] DR. Lovena Le. You will receive a reminder letter in the mail two months in advance. If you don't receive a letter, please call our office to schedule the follow-up appointment.  Remote monitoring is used to monitor your Pacemaker of ICD from home. This monitoring reduces the number of office visits required to check your device to one time per year. It allows [place] to keep an eye on the functioning of your device to ensure it is working properly. You are scheduled for a device check from home on 05/04/2014. You may send your transmission at any time that day. If you have a wireless device, the transmission will be sent automatically. After your physician reviews your transmission, you will receive a postcard with your next transmission date.

## 2014-02-02 NOTE — Assessment & Plan Note (Signed)
Her medtronic ICD is working normally. Will recheck in several months.

## 2014-02-02 NOTE — Progress Notes (Signed)
HPI Bianca [name] returns today for followup. She is a 80 yo woman with a h/o an ICM, chronic systolic CHF, LBBB, s/p BiV ICD implant. She has had problems with fluid overload and dietary indiscretion. She is now weighing herself. No chest pain. Still class 2 symptoms. Minimal edema. She has not been in the hospital but does admit to dietary indiscretion. She has had increased dyspnea and minimal fluid buildup.  Allergies  Allergen Reactions  . Neosporin [Neomycin-Polymyxin-Gramicidin]   . Quinapril Hcl      Current Outpatient Prescriptions  Medication Sig Dispense Refill  . allopurinol (ZYLOPRIM) 100 MG tablet Take 100 mg by mouth daily. Takes along with a 300 mg tablet to equal 400 mg      . allopurinol (ZYLOPRIM) 300 MG tablet Take 300 mg by mouth daily. Takes along with a 100mg  tablet to equal 400mg       . Calcium Carbonate-Vitamin D (CALCIUM 600+D) 600-400 MG-UNIT per tablet Take 1 tablet by mouth 2 (two) times daily.      . carvedilol (COREG) 12.5 MG tablet Take 1 tablet (12.5 mg total) by mouth 2 (two) times daily with a meal.  180 tablet  1  . carvedilol (COREG) 12.5 MG tablet TAKE 1 TABLET (12.5 MG TOTAL) BY MOUTH 2 (TWO) TIMES DAILY WITH A MEAL.  30 tablet  0  . Cholecalciferol (VITAMIN D3) 1000 UNITS CAPS Take 1 capsule by mouth daily.      . colestipol (COLESTID) 1 G tablet Take 1 g by mouth at bedtime.      . furosemide (LASIX) 40 MG tablet Take 20 mg by mouth daily.      Marland Kitchen gabapentin (NEURONTIN) 800 MG tablet Take 800 mg by mouth 3 (three) times daily.       Marland Kitchen gemfibrozil (LOPID) 600 MG tablet Take 600 mg by mouth 2 (two) times daily before a meal.      . losartan (COZAAR) 25 MG tablet Take 25 mg by mouth daily.      . Multiple Vitamin (MULTIVITAMIN) tablet Take 1 tablet by mouth daily.      . nitroGLYCERIN (NITROSTAT) 0.4 MG SL tablet Place 1 tablet (0.4 mg total) under the tongue every 5 (five) minutes as needed for chest pain.  25 tablet  6  . nortriptyline (PAMELOR) 25 MG  capsule Take 25 mg by mouth at bedtime.      . pravastatin (PRAVACHOL) 40 MG tablet Take 2 tablets (80 mg total) by mouth every evening.  180 tablet  3  . spironolactone (ALDACTONE) 25 MG tablet Take 1 tablet (25 mg total) by mouth daily.  90 tablet  3  . warfarin (COUMADIN) 5 MG tablet Take as directed by [facility] Clinic  125 tablet  1   No current facility-administered medications for this visit.     Past Medical History  Diagnosis Date  . Pacemaker   . ICD (implantable cardiac defibrillator) in place   . Myocardial infarction 2008  . Sleep apnea   . Arthritis   . Neuromuscular disorder   . CHF (congestive heart failure)   . Hypertension   . Dysrhythmia   . Chronic systolic heart failure   . Acute systolic heart failure     ROS:   All systems reviewed and negative except as noted in the HPI.   Past Surgical History  Procedure Laterality Date  . Cholecystectomy    . Abdominal hysterectomy    . Ankle surgery    . Insert / replace /  remove pacemaker  2008  . Knee ligament reconstruction  2012  . Tonsillectomy    . Cardiac catheterization    . Eye surgery       No family history on file.   History   Social History  . Marital Status: Married    Spouse Name: N/A    Number of Children: N/A  . Years of Education: N/A   Occupational History  . Not on file.   Social History Main Topics  . Smoking status: Former Research scientist (life sciences)  . Smokeless tobacco: Former Systems developer  . Alcohol Use: No  . Drug Use: No  . Sexual Activity: Not Currently   Other Topics Concern  . Not on file   Social History Narrative  . No narrative on file     BP 126/79  Pulse 76  Ht 5\' 5"  (1.651 m)  Wt 242 lb (109.77 kg)  BMI 40.27 kg/m2  Physical Exam:  Well appearing 80 yo woman, NAD HEENT: Unremarkable Neck:  7 cm JVD, no thyromegally Lungs:  Clear with no wheezes. HEART:  Regular rate rhythm, no murmurs, no rubs, no clicks Abd:  soft, positive bowel sounds, no organomegally, no rebound,  no guarding Ext:  2 plus pulses, no edema, no cyanosis, no clubbing Skin:  No rashes no nodules Neuro:  CN II through XII intact, motor grossly intact  EKG - nsr with BiV pacing  DEVICE  Normal device function.  See PaceArt for details.   Assess/Plan:

## 2014-02-03 LAB — MDC_IDC_ENUM_SESS_TYPE_INCLINIC
Battery Voltage: 3.01 V
Brady Statistic AP VP Percent: 1.29 %
Brady Statistic AS VS Percent: 1.18 %
Brady Statistic RV Percent Paced: 98.8 %
Date Time Interrogation Session: 20150818182845
HIGH POWER IMPEDANCE MEASURED VALUE: 49 Ohm
HIGH POWER IMPEDANCE MEASURED VALUE: 627 Ohm
HIGH POWER IMPEDANCE MEASURED VALUE: 66 Ohm
HighPow Impedance: 266 Ohm
Lead Channel Impedance Value: 437 Ohm
Lead Channel Impedance Value: 532 Ohm
Lead Channel Pacing Threshold Amplitude: 0.625 V
Lead Channel Pacing Threshold Pulse Width: 0.4 ms
Lead Channel Pacing Threshold Pulse Width: 1 ms
Lead Channel Sensing Intrinsic Amplitude: 16.75 mV
Lead Channel Sensing Intrinsic Amplitude: 2 mV
Lead Channel Sensing Intrinsic Amplitude: 21.5 mV
Lead Channel Setting Pacing Amplitude: 2.5 V
Lead Channel Setting Pacing Amplitude: 2.5 V
Lead Channel Setting Pacing Pulse Width: 1 ms
MDC IDC MSMT LEADCHNL LV IMPEDANCE VALUE: 703 Ohm
MDC IDC MSMT LEADCHNL LV IMPEDANCE VALUE: 969 Ohm
MDC IDC MSMT LEADCHNL LV PACING THRESHOLD AMPLITUDE: 1.75 V
MDC IDC MSMT LEADCHNL RA PACING THRESHOLD AMPLITUDE: 1.125 V
MDC IDC MSMT LEADCHNL RA SENSING INTR AMPL: 1.75 mV
MDC IDC MSMT LEADCHNL RV IMPEDANCE VALUE: 779 Ohm
MDC IDC MSMT LEADCHNL RV PACING THRESHOLD PULSEWIDTH: 0.4 ms
MDC IDC SET LEADCHNL RA PACING AMPLITUDE: 2 V
MDC IDC SET LEADCHNL RV PACING PULSEWIDTH: 0.4 ms
MDC IDC SET LEADCHNL RV SENSING SENSITIVITY: 0.3 mV
MDC IDC SET ZONE DETECTION INTERVAL: 300 ms
MDC IDC SET ZONE DETECTION INTERVAL: 360 ms
MDC IDC STAT BRADY AP VS PERCENT: 0.01 %
MDC IDC STAT BRADY AS VP PERCENT: 97.51 %
MDC IDC STAT BRADY RA PERCENT PACED: 1.31 %
Zone Setting Detection Interval: 350 ms
Zone Setting Detection Interval: 370 ms

## 2014-02-15 ENCOUNTER — Other Ambulatory Visit: Payer: Self-pay | Admitting: *Deleted

## 2014-02-15 DIAGNOSIS — I509 Heart failure, unspecified: Secondary | ICD-10-CM

## 2014-02-15 DIAGNOSIS — I428 Other cardiomyopathies: Secondary | ICD-10-CM

## 2014-02-15 MED ORDER — CARVEDILOL 12.5 MG PO TABS: 12.5000 mg | ORAL_TABLET | Freq: Two times a day (BID) | ORAL | Status: DC

## 2014-03-02 ENCOUNTER — Ambulatory Visit (INDEPENDENT_AMBULATORY_CARE_PROVIDER_SITE_OTHER): Payer: Medicare Other | Admitting: *Deleted

## 2014-03-02 DIAGNOSIS — Z5181 Encounter for therapeutic drug level monitoring: Secondary | ICD-10-CM

## 2014-03-02 DIAGNOSIS — I4891 Unspecified atrial fibrillation: Secondary | ICD-10-CM

## 2014-03-02 LAB — POCT INR: INR: 2.5

## 2014-04-01 ENCOUNTER — Ambulatory Visit (INDEPENDENT_AMBULATORY_CARE_PROVIDER_SITE_OTHER): Payer: Medicare Other | Admitting: Pharmacist

## 2014-04-01 DIAGNOSIS — Z5181 Encounter for therapeutic drug level monitoring: Secondary | ICD-10-CM

## 2014-04-01 DIAGNOSIS — I4891 Unspecified atrial fibrillation: Secondary | ICD-10-CM

## 2014-04-01 DIAGNOSIS — Z23 Encounter for immunization: Secondary | ICD-10-CM

## 2014-04-01 LAB — POCT INR: INR: 2.4

## 2014-04-01 MED ORDER — WARFARIN SODIUM 5 MG PO TABS: ORAL_TABLET | ORAL | Status: DC

## 2014-04-06 ENCOUNTER — Other Ambulatory Visit: Payer: Self-pay

## 2014-04-06 DIAGNOSIS — Z95810 Presence of automatic (implantable) cardiac defibrillator: Secondary | ICD-10-CM

## 2014-04-06 MED ORDER — SPIRONOLACTONE 25 MG PO TABS: 25.0000 mg | ORAL_TABLET | Freq: Every day | ORAL | Status: DC

## 2014-04-23 ENCOUNTER — Other Ambulatory Visit: Payer: Self-pay | Admitting: Internal Medicine

## 2014-04-23 DIAGNOSIS — I5022 Chronic systolic (congestive) heart failure: Secondary | ICD-10-CM

## 2014-04-23 MED ORDER — PRAVASTATIN SODIUM 40 MG PO TABS: 80.0000 mg | ORAL_TABLET | Freq: Every evening | ORAL | Status: DC

## 2014-05-04 ENCOUNTER — Encounter: Payer: Self-pay | Admitting: Internal Medicine

## 2014-05-04 ENCOUNTER — Ambulatory Visit (INDEPENDENT_AMBULATORY_CARE_PROVIDER_SITE_OTHER): Payer: Medicare Other | Admitting: *Deleted

## 2014-05-04 DIAGNOSIS — I429 Cardiomyopathy, unspecified: Secondary | ICD-10-CM

## 2014-05-04 DIAGNOSIS — I5022 Chronic systolic (congestive) heart failure: Secondary | ICD-10-CM

## 2014-05-04 LAB — MDC_IDC_ENUM_SESS_TYPE_REMOTE
Brady Statistic AS VP Percent: 98.29 %
Brady Statistic AS VS Percent: 0.52 %
Brady Statistic RA Percent Paced: 1.19 %
Brady Statistic RV Percent Paced: 99.47 %
Date Time Interrogation Session: 20151117162242
HIGH POWER IMPEDANCE MEASURED VALUE: 49 Ohm
HIGH POWER IMPEDANCE MEASURED VALUE: 65 Ohm
HighPow Impedance: 589 Ohm
Lead Channel Impedance Value: 437 Ohm
Lead Channel Impedance Value: 513 Ohm
Lead Channel Impedance Value: 722 Ohm
Lead Channel Pacing Threshold Amplitude: 1.75 V
Lead Channel Pacing Threshold Pulse Width: 0.4 ms
Lead Channel Pacing Threshold Pulse Width: 1 ms
Lead Channel Sensing Intrinsic Amplitude: 18.625 mV
Lead Channel Sensing Intrinsic Amplitude: 18.625 mV
Lead Channel Setting Pacing Amplitude: 2.25 V
Lead Channel Setting Pacing Amplitude: 2.5 V
Lead Channel Setting Pacing Pulse Width: 0.4 ms
Lead Channel Setting Pacing Pulse Width: 1 ms
MDC IDC MSMT BATTERY VOLTAGE: 2.98 V
MDC IDC MSMT LEADCHNL LV IMPEDANCE VALUE: 722 Ohm
MDC IDC MSMT LEADCHNL LV IMPEDANCE VALUE: 969 Ohm
MDC IDC MSMT LEADCHNL RA PACING THRESHOLD AMPLITUDE: 1 V
MDC IDC MSMT LEADCHNL RA PACING THRESHOLD PULSEWIDTH: 0.4 ms
MDC IDC MSMT LEADCHNL RA SENSING INTR AMPL: 2 mV
MDC IDC MSMT LEADCHNL RA SENSING INTR AMPL: 2 mV
MDC IDC MSMT LEADCHNL RV PACING THRESHOLD AMPLITUDE: 0.75 V
MDC IDC SET LEADCHNL RA PACING AMPLITUDE: 2 V
MDC IDC SET LEADCHNL RV SENSING SENSITIVITY: 0.3 mV
MDC IDC SET ZONE DETECTION INTERVAL: 300 ms
MDC IDC SET ZONE DETECTION INTERVAL: 360 ms
MDC IDC STAT BRADY AP VP PERCENT: 1.18 %
MDC IDC STAT BRADY AP VS PERCENT: 0.01 %
Zone Setting Detection Interval: 350 ms
Zone Setting Detection Interval: 370 ms

## 2014-05-04 NOTE — Progress Notes (Signed)
Remote ICD transmission.   

## 2014-05-12 ENCOUNTER — Encounter: Payer: Self-pay | Admitting: Cardiology

## 2014-05-18 ENCOUNTER — Ambulatory Visit (INDEPENDENT_AMBULATORY_CARE_PROVIDER_SITE_OTHER): Payer: Medicare Other | Admitting: Surgery

## 2014-05-18 DIAGNOSIS — I4891 Unspecified atrial fibrillation: Secondary | ICD-10-CM

## 2014-05-18 DIAGNOSIS — Z5181 Encounter for therapeutic drug level monitoring: Secondary | ICD-10-CM

## 2014-05-18 LAB — POCT INR: INR: 3.2

## 2014-06-15 ENCOUNTER — Ambulatory Visit (INDEPENDENT_AMBULATORY_CARE_PROVIDER_SITE_OTHER): Payer: Medicare Other | Admitting: *Deleted

## 2014-06-15 DIAGNOSIS — I4891 Unspecified atrial fibrillation: Secondary | ICD-10-CM

## 2014-06-15 DIAGNOSIS — Z5181 Encounter for therapeutic drug level monitoring: Secondary | ICD-10-CM

## 2014-06-15 LAB — POCT INR: INR: 3.2

## 2014-07-01 ENCOUNTER — Ambulatory Visit (INDEPENDENT_AMBULATORY_CARE_PROVIDER_SITE_OTHER): Payer: Medicare Other | Admitting: Pharmacist

## 2014-07-01 DIAGNOSIS — Z5181 Encounter for therapeutic drug level monitoring: Secondary | ICD-10-CM

## 2014-07-01 DIAGNOSIS — I4891 Unspecified atrial fibrillation: Secondary | ICD-10-CM

## 2014-07-01 LAB — POCT INR: INR: 2.2

## 2014-07-20 ENCOUNTER — Encounter: Payer: Self-pay | Admitting: Cardiology

## 2014-07-20 ENCOUNTER — Other Ambulatory Visit: Payer: Self-pay | Admitting: *Deleted

## 2014-07-20 ENCOUNTER — Ambulatory Visit (INDEPENDENT_AMBULATORY_CARE_PROVIDER_SITE_OTHER): Payer: Medicare Other | Admitting: *Deleted

## 2014-07-20 ENCOUNTER — Ambulatory Visit (INDEPENDENT_AMBULATORY_CARE_PROVIDER_SITE_OTHER): Payer: Medicare Other | Admitting: Cardiology

## 2014-07-20 ENCOUNTER — Ambulatory Visit: Payer: Medicare Other | Admitting: Cardiology

## 2014-07-20 VITALS — BP 118/76 | HR 70 | Ht 65.0 in | Wt 237.0 lb

## 2014-07-20 DIAGNOSIS — Z7901 Long term (current) use of anticoagulants: Secondary | ICD-10-CM

## 2014-07-20 DIAGNOSIS — Z5181 Encounter for therapeutic drug level monitoring: Secondary | ICD-10-CM

## 2014-07-20 DIAGNOSIS — I5022 Chronic systolic (congestive) heart failure: Secondary | ICD-10-CM

## 2014-07-20 DIAGNOSIS — I481 Persistent atrial fibrillation: Secondary | ICD-10-CM

## 2014-07-20 DIAGNOSIS — I4819 Other persistent atrial fibrillation: Secondary | ICD-10-CM

## 2014-07-20 DIAGNOSIS — I4891 Unspecified atrial fibrillation: Secondary | ICD-10-CM

## 2014-07-20 LAB — BASIC METABOLIC PANEL
BUN: 28 mg/dL — AB (ref 6–23)
CO2: 30 mEq/L (ref 19–32)
CREATININE: 1.06 mg/dL (ref 0.40–1.20)
Calcium: 10.1 mg/dL (ref 8.4–10.5)
Chloride: 99 mEq/L (ref 96–112)
GFR: 53.05 mL/min — AB (ref >60.00)
Glucose, Bld: 98 mg/dL (ref 70–99)
Potassium: 4 mEq/L (ref 3.5–5.1)
SODIUM: 138 meq/L (ref 135–145)

## 2014-07-20 LAB — CBC
HCT: 38 % (ref 36.0–46.0)
HEMOGLOBIN: 12.9 g/dL (ref 12.0–15.0)
MCHC: 33.9 g/dL (ref 30.0–36.0)
MCV: 93.3 fl (ref 78.0–100.0)
PLATELETS: 235 10*3/uL (ref 150.0–400.0)
RBC: 4.07 Mil/uL (ref 3.87–5.11)
RDW: 14.5 % (ref 11.5–15.5)
WBC: 7.6 10*3/uL (ref 4.0–10.5)

## 2014-07-20 LAB — LIPID PANEL
CHOL/HDL RATIO: 4
Cholesterol: 158 mg/dL (ref 0–200)
HDL: 38.6 mg/dL — ABNORMAL LOW (ref >39.00)
NonHDL: 119.4
Triglycerides: 332 mg/dL — ABNORMAL HIGH (ref 0.0–149.0)
VLDL: 66.4 mg/dL — ABNORMAL HIGH (ref 0.0–40.0)

## 2014-07-20 LAB — POCT INR: INR: 2

## 2014-07-20 LAB — LDL CHOLESTEROL, DIRECT: Direct LDL: 80 mg/dL

## 2014-07-20 LAB — ALT: ALT: 19 U/L (ref 0–35)

## 2014-07-20 NOTE — Patient Instructions (Addendum)
The current medical regimen is effective;  continue present plan and medications.  Please have blood work today (CBC,BMP,ALT and Lipid)  Follow up in 6 months with Dr. Marlou Porch.  You will receive a letter in the mail 2 months before you are due.  Please call us when you receive this letter to schedule your follow up appointment.  Thank you for choosing [facility]!!

## 2014-07-20 NOTE — Progress Notes (Signed)
[name], [name], [name]  [address] Phone: [phone] Fax:  [phone]  Date:  07/20/2014   ID:  Bianca [name], DOB Apr 09, 1933, MRN [id number]  PCP:  Dorian Heckle, MD   History of Present Illness: Bianca [name] is a 81 y.o. female with nonischemic cardiomyopathy, hx of atrial fibrillation-currently with chronic anticoagulation, hx of Chronic kidney disease-stage III, chronic systolic heart failure-status post Bi- ventricular ICD-followed by Dr. Lovena Le. No defibrillations. EF 35%. Symptoms have been very stable. NYHA class II except for hospitalization for heart failure she was discharged on 11/24/12 with primary diagnosis of acute on chronic systolic heart failure. She was admitted with fluid overload, shortness of breath and diuresed successfully. She was also treated for urinary tract infection by ER physician. She did have periods of hypercarbia with CO2 above 50 with hypersomnolence and has had untreated sleep apnea. Still with mild SOB when talking but not severe. Doing well with CPAP.   Husband had dementia, died. Her family is very helpful with her overall care.  December had bronchitis and needed to utilize supplemental oxygen for a few days. He has lost approximately 8 pounds. No chest pain.    Wt Readings from Last 3 Encounters:  07/20/14 237 lb (107.502 kg)  02/02/14 242 lb (109.77 kg)  01/19/14 244 lb (110.678 kg)     Past Medical History  Diagnosis Date  . Pacemaker   . ICD (implantable cardiac defibrillator) in place   . Myocardial infarction 2008  . Sleep apnea   . Arthritis   . Neuromuscular disorder   . CHF (congestive heart failure)   . Hypertension   . Dysrhythmia   . Chronic systolic heart failure   . Acute systolic heart failure     Past Surgical History  Procedure Laterality Date  . Cholecystectomy    . Abdominal hysterectomy    . Ankle surgery    . Insert / replace / remove pacemaker  2008  . Knee ligament  reconstruction  2012  . Tonsillectomy    . Cardiac catheterization    . Eye surgery      Current Outpatient Prescriptions  Medication Sig Dispense Refill  . allopurinol (ZYLOPRIM) 100 MG tablet Take 100 mg by mouth daily. Takes along with a 300 mg tablet to equal 400 mg    . allopurinol (ZYLOPRIM) 300 MG tablet Take 300 mg by mouth daily. Takes along with a 100mg  tablet to equal 400mg     . Calcium Carbonate-Vitamin D (CALCIUM 600+D) 600-400 MG-UNIT per tablet Take 1 tablet by mouth 2 (two) times daily.    . carvedilol (COREG) 12.5 MG tablet Take 1 tablet (12.5 mg total) by mouth 2 (two) times daily with a meal. 180 tablet 1  . Cholecalciferol (VITAMIN D3) 1000 UNITS CAPS Take 1 capsule by mouth daily.    . colestipol (COLESTID) 1 G tablet Take 1 g by mouth at bedtime.    . furosemide (LASIX) 40 MG tablet Take 20 mg by mouth daily.    Marland Kitchen gabapentin (NEURONTIN) 800 MG tablet Take 800 mg by mouth 3 (three) times daily.     Marland Kitchen gemfibrozil (LOPID) 600 MG tablet Take 600 mg by mouth 2 (two) times daily before a meal.    . losartan (COZAAR) 25 MG tablet Take 25 mg by mouth daily.    . Multiple Vitamin (MULTIVITAMIN) tablet Take 1 tablet by mouth daily.    . nitroGLYCERIN (NITROSTAT) 0.4 MG SL tablet  Place 1 tablet (0.4 mg total) under the tongue every 5 (five) minutes as needed for chest pain. 25 tablet 6  . nortriptyline (PAMELOR) 25 MG capsule Take 25 mg by mouth at bedtime.    . pravastatin (PRAVACHOL) 40 MG tablet Take 2 tablets (80 mg total) by mouth every evening. 180 tablet 3  . spironolactone (ALDACTONE) 25 MG tablet Take 1 tablet (25 mg total) by mouth daily. 90 tablet 3  . warfarin (COUMADIN) 5 MG tablet Take as directed by [facility] 125 tablet 1   No current facility-administered medications for this visit.    Allergies:    Allergies  Allergen Reactions  . Neosporin [Neomycin-Polymyxin-Gramicidin]   . Quinapril Hcl     Social History:  The patient  reports that she has quit  smoking. She has quit using smokeless tobacco. She reports that she does not drink alcohol or use illicit drugs.   ROS:  Please see the history of present illness. Neuropathy.  Denies any bleeding, syncope, orthopnea, PND    PHYSICAL EXAM: VS:  BP 118/76 mmHg  Pulse 70  Ht 5\' 5"  (1.651 m)  Wt 237 lb (107.502 kg)  BMI 39.44 kg/m2  SpO2 91% Well nourished, well developed, in no acute distress HEENT: normal Neck: no JVD Cardiac:  normal S1, S2; RRR; no murmur Lungs:  clear to auscultation bilaterally, no wheezing, rhonchi or rales Abd: soft, nontender, no hepatomegalyobese Ext: no edemaright knee brace Skin: warm and dry Neuro: no focal abnormalities noted  EKG:  None today.     ASSESSMENT AND PLAN:  1. Atrial fibrillation-currently controlled, chronic anticoagulation in place. CHADS-VASC - 5. 2. Chronic anticoagulation-kidney with warfarin, INR goal 2-3. I will check a CBC and basic metabolic profile. 3. Nonischemic Cardiomyopathy-EF 35%. Medications reviewed. Currently well compensated. She has been well compensated for years. Checking basic metabolic profile today. NYHA 2. 4. History of ICD implantation - functioning well. 5. Hyperlipidemia-I will check a lipid profile and ALT. Educations reviewed. 6. Right knee osteoarthrosis  7. Morbid obesity-doing a good job with weight loss.  8. 6 month follow up  Signed, Candee Furbish, MD [facility]  07/20/2014 11:24 AM

## 2014-07-26 ENCOUNTER — Telehealth: Payer: Self-pay | Admitting: Cardiology

## 2014-07-26 NOTE — Telephone Encounter (Signed)
Called patient with lab results. Per Dr. Marlou Porch, triglycerides remain elevated, continue with current medications, and other lab work is unchanged. Patient verbalized understanding.

## 2014-07-26 NOTE — Telephone Encounter (Signed)
New message ° ° ° ° °Want lab results °

## 2014-08-04 ENCOUNTER — Ambulatory Visit (INDEPENDENT_AMBULATORY_CARE_PROVIDER_SITE_OTHER): Payer: Medicare Other | Admitting: *Deleted

## 2014-08-04 ENCOUNTER — Telehealth: Payer: Self-pay | Admitting: Cardiology

## 2014-08-04 DIAGNOSIS — I429 Cardiomyopathy, unspecified: Secondary | ICD-10-CM

## 2014-08-04 DIAGNOSIS — I5022 Chronic systolic (congestive) heart failure: Secondary | ICD-10-CM

## 2014-08-04 NOTE — Telephone Encounter (Signed)
Attempted to confirm remote transmission with pt. No answer and was unable to leave a message.   

## 2014-08-05 NOTE — Progress Notes (Signed)
Remote ICD transmission.   

## 2014-08-06 LAB — MDC_IDC_ENUM_SESS_TYPE_REMOTE
Brady Statistic AP VS Percent: 0.02 %
Brady Statistic AS VP Percent: 97.91 %
Brady Statistic RA Percent Paced: 1.26 %
Brady Statistic RV Percent Paced: 99.16 %
HIGH POWER IMPEDANCE MEASURED VALUE: 52 Ohm
HIGH POWER IMPEDANCE MEASURED VALUE: 665 Ohm
HIGH POWER IMPEDANCE MEASURED VALUE: 70 Ohm
Lead Channel Impedance Value: 1140 Ohm
Lead Channel Impedance Value: 494 Ohm
Lead Channel Impedance Value: 817 Ohm
Lead Channel Impedance Value: 817 Ohm
Lead Channel Pacing Threshold Amplitude: 0.625 V
Lead Channel Pacing Threshold Amplitude: 1 V
Lead Channel Pacing Threshold Amplitude: 1.875 V
Lead Channel Pacing Threshold Pulse Width: 0.4 ms
Lead Channel Pacing Threshold Pulse Width: 0.4 ms
Lead Channel Pacing Threshold Pulse Width: 1 ms
Lead Channel Sensing Intrinsic Amplitude: 1.875 mV
Lead Channel Sensing Intrinsic Amplitude: 17 mV
Lead Channel Setting Pacing Amplitude: 2.5 V
Lead Channel Setting Pacing Amplitude: 2.5 V
Lead Channel Setting Pacing Pulse Width: 0.4 ms
Lead Channel Setting Pacing Pulse Width: 1 ms
Lead Channel Setting Sensing Sensitivity: 0.3 mV
MDC IDC MSMT BATTERY VOLTAGE: 2.96 V
MDC IDC MSMT LEADCHNL RA IMPEDANCE VALUE: 532 Ohm
MDC IDC MSMT LEADCHNL RA SENSING INTR AMPL: 1.875 mV
MDC IDC MSMT LEADCHNL RV SENSING INTR AMPL: 17 mV
MDC IDC SESS DTM: 20160218004211
MDC IDC SET LEADCHNL RA PACING AMPLITUDE: 2 V
MDC IDC SET ZONE DETECTION INTERVAL: 300 ms
MDC IDC SET ZONE DETECTION INTERVAL: 350 ms
MDC IDC STAT BRADY AP VP PERCENT: 1.24 %
MDC IDC STAT BRADY AS VS PERCENT: 0.83 %
Zone Setting Detection Interval: 360 ms
Zone Setting Detection Interval: 370 ms

## 2014-08-13 ENCOUNTER — Encounter: Payer: Self-pay | Admitting: *Deleted

## 2014-08-19 ENCOUNTER — Ambulatory Visit (INDEPENDENT_AMBULATORY_CARE_PROVIDER_SITE_OTHER): Payer: Medicare Other | Admitting: *Deleted

## 2014-08-19 DIAGNOSIS — I4891 Unspecified atrial fibrillation: Secondary | ICD-10-CM

## 2014-08-19 DIAGNOSIS — Z5181 Encounter for therapeutic drug level monitoring: Secondary | ICD-10-CM

## 2014-08-19 LAB — POCT INR: INR: 2.2

## 2014-08-24 ENCOUNTER — Encounter: Payer: Self-pay | Admitting: Internal Medicine

## 2014-08-26 ENCOUNTER — Other Ambulatory Visit: Payer: Self-pay

## 2014-08-26 MED ORDER — CARVEDILOL 12.5 MG PO TABS: 12.5000 mg | ORAL_TABLET | Freq: Two times a day (BID) | ORAL | Status: DC

## 2014-09-02 ENCOUNTER — Other Ambulatory Visit: Payer: Self-pay

## 2014-09-06 ENCOUNTER — Other Ambulatory Visit: Payer: Self-pay

## 2014-09-06 DIAGNOSIS — Z1239 Encounter for other screening for malignant neoplasm of breast: Secondary | ICD-10-CM

## 2014-09-24 ENCOUNTER — Other Ambulatory Visit: Payer: Self-pay | Admitting: *Deleted

## 2014-09-24 DIAGNOSIS — I4891 Unspecified atrial fibrillation: Secondary | ICD-10-CM

## 2014-09-24 MED ORDER — WARFARIN SODIUM 5 MG PO TABS: ORAL_TABLET | ORAL | Status: DC

## 2014-09-24 NOTE — Telephone Encounter (Signed)
Refill done as requested 

## 2014-09-27 ENCOUNTER — Telehealth: Payer: Self-pay | Admitting: *Deleted

## 2014-09-27 NOTE — Telephone Encounter (Signed)
S/w Edwina @ [phone] for pravastatin ( 80 mg ) qhs. Will fax form over. No longer get approval over the phone, do not have nurses anymore.

## 2014-09-28 ENCOUNTER — Telehealth: Payer: Self-pay | Admitting: *Deleted

## 2014-09-28 ENCOUNTER — Other Ambulatory Visit: Payer: Self-pay | Admitting: *Deleted

## 2014-09-28 DIAGNOSIS — I5022 Chronic systolic (congestive) heart failure: Secondary | ICD-10-CM

## 2014-09-28 MED ORDER — PRAVASTATIN SODIUM 80 MG PO TABS: 80.0000 mg | ORAL_TABLET | Freq: Every evening | ORAL | Status: DC

## 2014-09-28 NOTE — Telephone Encounter (Signed)
Spoke with pt - she has been taking Pravastatin 40 mg 2 tablets a day because "that's the way they prescribed it"  Notified pt that I would send in an RX for the 80 mg tablets and she should take one a day.  She stated understanding.

## 2014-09-30 ENCOUNTER — Ambulatory Visit (INDEPENDENT_AMBULATORY_CARE_PROVIDER_SITE_OTHER): Payer: Medicare Other | Admitting: *Deleted

## 2014-09-30 DIAGNOSIS — I4891 Unspecified atrial fibrillation: Secondary | ICD-10-CM | POA: Diagnosis not present

## 2014-09-30 DIAGNOSIS — Z5181 Encounter for therapeutic drug level monitoring: Secondary | ICD-10-CM

## 2014-09-30 LAB — POCT INR: INR: 2.1

## 2014-10-05 ENCOUNTER — Encounter: Payer: Self-pay | Admitting: Podiatry

## 2014-10-05 ENCOUNTER — Ambulatory Visit (INDEPENDENT_AMBULATORY_CARE_PROVIDER_SITE_OTHER): Payer: Medicare Other

## 2014-10-05 ENCOUNTER — Telehealth: Payer: Self-pay | Admitting: *Deleted

## 2014-10-05 ENCOUNTER — Telehealth: Payer: Self-pay | Admitting: Internal Medicine

## 2014-10-05 ENCOUNTER — Ambulatory Visit (INDEPENDENT_AMBULATORY_CARE_PROVIDER_SITE_OTHER): Payer: Medicare Other | Admitting: Podiatry

## 2014-10-05 VITALS — BP 122/72 | HR 70 | Resp 16 | Ht 65.0 in | Wt 237.0 lb

## 2014-10-05 DIAGNOSIS — G629 Polyneuropathy, unspecified: Secondary | ICD-10-CM

## 2014-10-05 DIAGNOSIS — M722 Plantar fascial fibromatosis: Secondary | ICD-10-CM

## 2014-10-05 DIAGNOSIS — M2041 Other hammer toe(s) (acquired), right foot: Secondary | ICD-10-CM

## 2014-10-05 DIAGNOSIS — M2042 Other hammer toe(s) (acquired), left foot: Secondary | ICD-10-CM | POA: Diagnosis not present

## 2014-10-05 NOTE — Progress Notes (Signed)
   Subjective:    Patient ID: Bianca [name], female    DOB: 11/07/1932, 81 y.o.   MRN: [id number]  HPI Comments: "I have neuropathy"  Patient c/o burning, numbness, tingling plantar bilateral for several years. She did have a nerve conduction study that confirmed neuropathy. Symptoms worse at night. She has been being treated by her PCP-gabapentin 800mg  TID and 400mg  QHS-no better. She has trouble finding comfortable shoes. Orthopedist recommended referral to our office.      Review of Systems  HENT: Positive for hearing loss.   Cardiovascular: Positive for leg swelling.  Gastrointestinal: Positive for diarrhea.  Musculoskeletal: Positive for arthralgias and gait problem.  All other systems reviewed and are negative.      Objective:   Physical Exam: I have reviewed her past medical history medications allergy surgery social history and review of systems. Pulses are strongly palpable bilateral. Neurologic sensorium is decreased per Semmes-Weinstein monofilament and deep tendon reflexes are not elicitable. Muscle strength +5 over 5 on dorsiflexion plantar flexion is weak to the left side with internal rotation and external rotation normal. Orthopedic evaluation of his. Severe hammertoe deformities rigid in nature bilateral. Radiographic evaluation does confirm severe osteoarthritis and osteopenia bilateral. Hammertoe deformities with soft tissue increased density at the plantar fascial calcaneal insertion site. She also has pain on palpation medial calcaneal tubercles bilateral. This is indicative of plantar fasciitis.        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  Plan: Injected her bilateral heels today with Kenalog and local anesthetic. She will continue her 800 mg 3 times a day and I did suggest that she had an 800 mg Neurontin to her bedtime regimen.

## 2014-10-05 NOTE — Telephone Encounter (Signed)
Rescheduled remote transmission for 11-17-14

## 2014-10-05 NOTE — Telephone Encounter (Signed)
Pt's daughter informed that pt is scheduled for removal basal cell from  nose at [facility] on May 23rd . This nurse called and spoke with Linwood Dibbles at [facility] and she states she does not need to hold coumadin for this procedure. Called  Pt's daughter with instruction that she does not need to hold coumadin for the procedure and she states understanding. Pt's daughter also states they have been to Podiatrist and he said pt had neuropathy and advised to start B12 or Folate or medication that he has in his office [facility] and asked Cyril Mourning our Pharmacist regarding these possible interactions and she states to have pt start what ever she decides to do about 10 days before her next appt and this information given to daughter with verbalization of understanding

## 2014-10-05 NOTE — Telephone Encounter (Signed)
New Message  Pt wanted to reschedule remote device check. Please call back and discuss.

## 2014-10-20 ENCOUNTER — Ambulatory Visit: Payer: Medicare Other

## 2014-10-28 ENCOUNTER — Ambulatory Visit: Payer: Medicare Other | Admitting: Podiatry

## 2014-11-17 ENCOUNTER — Ambulatory Visit (INDEPENDENT_AMBULATORY_CARE_PROVIDER_SITE_OTHER): Payer: Medicare Other | Admitting: *Deleted

## 2014-11-17 DIAGNOSIS — I429 Cardiomyopathy, unspecified: Secondary | ICD-10-CM | POA: Diagnosis not present

## 2014-11-17 DIAGNOSIS — I5022 Chronic systolic (congestive) heart failure: Secondary | ICD-10-CM

## 2014-11-17 NOTE — Progress Notes (Signed)
Remote ICD transmission.   

## 2014-11-18 ENCOUNTER — Ambulatory Visit (INDEPENDENT_AMBULATORY_CARE_PROVIDER_SITE_OTHER): Payer: Medicare Other

## 2014-11-18 DIAGNOSIS — Z5181 Encounter for therapeutic drug level monitoring: Secondary | ICD-10-CM | POA: Diagnosis not present

## 2014-11-18 DIAGNOSIS — I4891 Unspecified atrial fibrillation: Secondary | ICD-10-CM | POA: Diagnosis not present

## 2014-11-18 LAB — POCT INR: INR: 2.5

## 2014-11-21 LAB — CUP PACEART REMOTE DEVICE CHECK
Battery Voltage: 2.9 V
Brady Statistic AP VP Percent: 2.67 %
Brady Statistic AP VS Percent: 0.02 %
Brady Statistic AS VP Percent: 96.98 %
Brady Statistic RA Percent Paced: 2.69 %
HIGH POWER IMPEDANCE MEASURED VALUE: 627 Ohm
HighPow Impedance: 50 Ohm
HighPow Impedance: 61 Ohm
Lead Channel Impedance Value: 1045 Ohm
Lead Channel Impedance Value: 456 Ohm
Lead Channel Impedance Value: 760 Ohm
Lead Channel Pacing Threshold Pulse Width: 0.4 ms
Lead Channel Sensing Intrinsic Amplitude: 1.625 mV
Lead Channel Sensing Intrinsic Amplitude: 1.625 mV
Lead Channel Setting Pacing Amplitude: 2.5 V
Lead Channel Setting Pacing Pulse Width: 1 ms
MDC IDC MSMT LEADCHNL LV IMPEDANCE VALUE: 817 Ohm
MDC IDC MSMT LEADCHNL LV PACING THRESHOLD AMPLITUDE: 1.75 V
MDC IDC MSMT LEADCHNL LV PACING THRESHOLD PULSEWIDTH: 1 ms
MDC IDC MSMT LEADCHNL RA IMPEDANCE VALUE: 494 Ohm
MDC IDC MSMT LEADCHNL RA PACING THRESHOLD AMPLITUDE: 0.75 V
MDC IDC MSMT LEADCHNL RA PACING THRESHOLD PULSEWIDTH: 0.4 ms
MDC IDC MSMT LEADCHNL RV PACING THRESHOLD AMPLITUDE: 0.625 V
MDC IDC MSMT LEADCHNL RV SENSING INTR AMPL: 18.75 mV
MDC IDC MSMT LEADCHNL RV SENSING INTR AMPL: 18.75 mV
MDC IDC SESS DTM: 20160601122108
MDC IDC SET LEADCHNL LV PACING AMPLITUDE: 2.25 V
MDC IDC SET LEADCHNL RA PACING AMPLITUDE: 2 V
MDC IDC SET LEADCHNL RV PACING PULSEWIDTH: 0.4 ms
MDC IDC SET LEADCHNL RV SENSING SENSITIVITY: 0.3 mV
MDC IDC SET ZONE DETECTION INTERVAL: 350 ms
MDC IDC STAT BRADY AS VS PERCENT: 0.33 %
MDC IDC STAT BRADY RV PERCENT PACED: 99.65 %
Zone Setting Detection Interval: 300 ms
Zone Setting Detection Interval: 360 ms
Zone Setting Detection Interval: 370 ms

## 2014-11-26 ENCOUNTER — Encounter: Payer: Self-pay | Admitting: Cardiology

## 2014-11-30 ENCOUNTER — Encounter: Payer: Self-pay | Admitting: Internal Medicine

## 2014-12-02 ENCOUNTER — Other Ambulatory Visit: Payer: Self-pay

## 2014-12-02 DIAGNOSIS — Z1231 Encounter for screening mammogram for malignant neoplasm of breast: Secondary | ICD-10-CM

## 2014-12-23 ENCOUNTER — Encounter: Payer: Self-pay | Admitting: *Deleted

## 2014-12-28 ENCOUNTER — Ambulatory Visit (INDEPENDENT_AMBULATORY_CARE_PROVIDER_SITE_OTHER): Payer: Medicare Other | Admitting: *Deleted

## 2014-12-28 ENCOUNTER — Encounter: Payer: Self-pay | Admitting: Cardiology

## 2014-12-28 ENCOUNTER — Ambulatory Visit (INDEPENDENT_AMBULATORY_CARE_PROVIDER_SITE_OTHER): Payer: Medicare Other | Admitting: Cardiology

## 2014-12-28 VITALS — BP 118/70 | HR 69 | Ht 66.0 in | Wt 245.4 lb

## 2014-12-28 DIAGNOSIS — I4891 Unspecified atrial fibrillation: Secondary | ICD-10-CM | POA: Diagnosis not present

## 2014-12-28 DIAGNOSIS — I5022 Chronic systolic (congestive) heart failure: Secondary | ICD-10-CM

## 2014-12-28 DIAGNOSIS — Z7901 Long term (current) use of anticoagulants: Secondary | ICD-10-CM | POA: Diagnosis not present

## 2014-12-28 DIAGNOSIS — Z5181 Encounter for therapeutic drug level monitoring: Secondary | ICD-10-CM

## 2014-12-28 DIAGNOSIS — I481 Persistent atrial fibrillation: Secondary | ICD-10-CM | POA: Diagnosis not present

## 2014-12-28 DIAGNOSIS — I4819 Other persistent atrial fibrillation: Secondary | ICD-10-CM

## 2014-12-28 LAB — POCT INR: INR: 1.9

## 2014-12-28 NOTE — Patient Instructions (Signed)
Medication Instructions:  Your physician recommends that you continue on your current medications as directed. Please refer to the Current Medication list given to you today.  Follow-Up: Follow up in 6 months with Dr. Skains.  You will receive a letter in the mail 2 months before you are due.  Please call us when you receive this letter to schedule your follow up appointment.  Thank you for choosing [facility]!!     

## 2014-12-28 NOTE — Progress Notes (Signed)
[place] [address], [address], [place]  [address] Phone: [phone] Fax:  [phone]  Date:  12/28/2014   ID:  Bianca [name], DOB 02-27-1933, MRN [id number]  PCP:  Jonathon Bellows, MD   History of Present Illness: Bianca [name] is a 81 y.o. female with nonischemic cardiomyopathy, hx of atrial fibrillation-currently with chronic anticoagulation, hx of Chronic kidney disease-stage III, chronic systolic heart failure-status post Bi- ventricular ICD-followed by Dr. Lovena Le. No defibrillations. EF 35%. Symptoms have been very stable. NYHA class II except for hospitalization for heart failure she was discharged on 11/24/12 with primary diagnosis of acute on chronic systolic heart failure. She was admitted with fluid overload, shortness of breath and diuresed successfully. She did have periods of hypercarbia with CO2 above 50 with hypersomnolence and has had untreated sleep apnea. Still with mild SOB when talking but not severe. Doing well with CPAP.   Husband had dementia, died. Her family is very helpful with her overall care.    Wt Readings from Last 3 Encounters:  12/28/14 245 lb 6.4 oz (111.313 kg)  10/05/14 237 lb (107.502 kg)  07/20/14 237 lb (107.502 kg)     Past Medical History  Diagnosis Date  . Pacemaker   . ICD (implantable cardiac defibrillator) in place   . Myocardial infarction 2008  . Sleep apnea   . Arthritis   . Neuromuscular disorder   . CHF (congestive heart failure)   . Hypertension   . Dysrhythmia   . Chronic systolic heart failure   . Acute systolic heart failure     Past Surgical History  Procedure Laterality Date  . Cholecystectomy    . Abdominal hysterectomy    . Ankle surgery    . Insert / replace / remove pacemaker  2008  . Knee ligament reconstruction  2012  . Tonsillectomy    . Cardiac catheterization    . Eye surgery      Current Outpatient Prescriptions  Medication Sig Dispense Refill  . allopurinol (ZYLOPRIM) 100 MG  tablet Take 100 mg by mouth daily. Takes along with a 300 mg tablet to equal 400 mg    . allopurinol (ZYLOPRIM) 300 MG tablet Take 300 mg by mouth daily. Takes along with a 100mg  tablet to equal 400mg     . Calcium Carbonate-Vitamin D (CALCIUM 600+D) 600-400 MG-UNIT per tablet Take 1 tablet by mouth 2 (two) times daily.    . carvedilol (COREG) 12.5 MG tablet Take 1 tablet (12.5 mg total) by mouth 2 (two) times daily. 180 tablet 1  . Cholecalciferol (VITAMIN D3) 1000 UNITS CAPS Take 1 capsule by mouth daily.    . colestipol (COLESTID) 1 G tablet Take 1 g by mouth at bedtime.    . furosemide (LASIX) 40 MG tablet Take 20 mg by mouth daily.    Marland Kitchen gabapentin (NEURONTIN) 800 MG tablet Take 800 mg by mouth 3 (three) times daily.     Marland Kitchen gemfibrozil (LOPID) 600 MG tablet Take 600 mg by mouth 2 (two) times daily before a meal.    . losartan (COZAAR) 25 MG tablet Take 25 mg by mouth daily.    . Multiple Vitamin (MULTIVITAMIN) tablet Take 1 tablet by mouth daily.    . nitroGLYCERIN (NITROSTAT) 0.4 MG SL tablet Place 1 tablet (0.4 mg total) under the tongue every 5 (five) minutes as needed for chest pain. 25 tablet 6  . nortriptyline (PAMELOR) 25 MG capsule Take 25 mg by mouth at  bedtime.    . pravastatin (PRAVACHOL) 80 MG tablet Take 1 tablet (80 mg total) by mouth every evening. 90 tablet 3  . spironolactone (ALDACTONE) 25 MG tablet Take 1 tablet (25 mg total) by mouth daily. 90 tablet 3  . warfarin (COUMADIN) 5 MG tablet Take as directed by [facility] 125 tablet 1   No current facility-administered medications for this visit.    Allergies:    Allergies  Allergen Reactions  . Quinapril Hcl Swelling    Tongue and throat  . Neosporin [Neomycin-Polymyxin-Gramicidin] Rash    Social History:  The patient  reports that she has quit smoking. She has quit using smokeless tobacco. She reports that she does not drink alcohol or use illicit drugs.   ROS:  Please see the history of present illness.  Neuropathy.  Denies any bleeding, syncope, orthopnea, PND    PHYSICAL EXAM: VS:  BP 118/70 mmHg  Pulse 69  Ht 5\' 6"  (1.676 m)  Wt 245 lb 6.4 oz (111.313 kg)  BMI 39.63 kg/m2 Well nourished, well developed, in no acute distress HEENT: normal Neck: no JVD Cardiac:  normal S1, S2; RRR; no murmur Lungs:  clear to auscultation bilaterally, no wheezing, rhonchi or rales Abd: soft, nontender, no hepatomegalyobese Ext: no edemaright knee brace Skin: warm and dry Neuro: no focal abnormalities noted  EKG:  Today 12/28/14-appears sinus rhythm low voltage P waves seems to be proceeding curette complex with ventricular pacing..    Labs: 07/20/14-triglycerides 332, LDL 80, HDL 39   ASSESSMENT AND PLAN:  1. Atrial fibrillation-currently controlled, chronic anticoagulation in place. CHADS-VASC - 5. Defibrillator/Pacemaker in place. Actually appears potentially sinus rhythm today. 2. Chronic anticoagulation-kidney with warfarin, INR goal 2-3. I will check a CBC and basic metabolic profile. 3. Nonischemic Cardiomyopathy-EF 35%. Medications reviewed. Currently well compensated. She has been well compensated for years. NYHA 2. Last hospitalization 11/2012. 4. History of ICD implantation - functioning well. 5. Hyperlipidemia/hypertriglyceridemia- has been on Lopid as well as pravastatin. Baseline triglycerides are greater than 500 which puts her at risk for pancreatitis. We will continue. Education reviewed. 6. Right knee osteoarthrosis  7. Morbid obesity-continue to encourage weight loss.  8. 6 month follow up  Signed, Candee Furbish, MD [facility]  12/28/2014 10:48 AM

## 2015-01-19 ENCOUNTER — Ambulatory Visit
Admission: RE | Admit: 2015-01-19 | Discharge: 2015-01-19 | Disposition: A | Discharge status: Home / Self Care | Payer: Medicare Other | Source: Ambulatory Visit

## 2015-01-19 ENCOUNTER — Ambulatory Visit (INDEPENDENT_AMBULATORY_CARE_PROVIDER_SITE_OTHER): Payer: Medicare Other | Admitting: *Deleted

## 2015-01-19 DIAGNOSIS — Z1231 Encounter for screening mammogram for malignant neoplasm of breast: Secondary | ICD-10-CM

## 2015-01-19 DIAGNOSIS — I4891 Unspecified atrial fibrillation: Secondary | ICD-10-CM | POA: Diagnosis not present

## 2015-01-19 DIAGNOSIS — Z5181 Encounter for therapeutic drug level monitoring: Secondary | ICD-10-CM | POA: Diagnosis not present

## 2015-01-19 LAB — POCT INR: INR: 2.2

## 2015-02-16 ENCOUNTER — Encounter: Payer: Self-pay | Admitting: Internal Medicine

## 2015-02-16 ENCOUNTER — Ambulatory Visit (INDEPENDENT_AMBULATORY_CARE_PROVIDER_SITE_OTHER): Payer: Medicare Other | Admitting: *Deleted

## 2015-02-16 ENCOUNTER — Ambulatory Visit (INDEPENDENT_AMBULATORY_CARE_PROVIDER_SITE_OTHER): Payer: Medicare Other | Admitting: Internal Medicine

## 2015-02-16 VITALS — BP 106/68 | HR 77 | Ht 66.0 in | Wt 249.4 lb

## 2015-02-16 DIAGNOSIS — I5021 Acute systolic (congestive) heart failure: Secondary | ICD-10-CM

## 2015-02-16 DIAGNOSIS — I4819 Other persistent atrial fibrillation: Secondary | ICD-10-CM

## 2015-02-16 DIAGNOSIS — Z95810 Presence of automatic (implantable) cardiac defibrillator: Secondary | ICD-10-CM

## 2015-02-16 DIAGNOSIS — I481 Persistent atrial fibrillation: Secondary | ICD-10-CM | POA: Diagnosis not present

## 2015-02-16 DIAGNOSIS — I4891 Unspecified atrial fibrillation: Secondary | ICD-10-CM | POA: Diagnosis not present

## 2015-02-16 DIAGNOSIS — Z5181 Encounter for therapeutic drug level monitoring: Secondary | ICD-10-CM

## 2015-02-16 DIAGNOSIS — I5022 Chronic systolic (congestive) heart failure: Secondary | ICD-10-CM

## 2015-02-16 LAB — CUP PACEART INCLINIC DEVICE CHECK
Brady Statistic AS VP Percent: 97.72 %
Brady Statistic AS VS Percent: 0.55 %
Brady Statistic RA Percent Paced: 1.73 %
Brady Statistic RV Percent Paced: 99.44 %
Date Time Interrogation Session: 20160831100101
HIGH POWER IMPEDANCE MEASURED VALUE: 665 Ohm
HighPow Impedance: 247 Ohm
HighPow Impedance: 48 Ohm
HighPow Impedance: 64 Ohm
Lead Channel Impedance Value: 1102 Ohm
Lead Channel Pacing Threshold Amplitude: 1 V
Lead Channel Pacing Threshold Pulse Width: 0.4 ms
Lead Channel Pacing Threshold Pulse Width: 1 ms
Lead Channel Sensing Intrinsic Amplitude: 1.125 mV
Lead Channel Sensing Intrinsic Amplitude: 1.625 mV
Lead Channel Setting Pacing Amplitude: 2.5 V
Lead Channel Setting Pacing Amplitude: 2.5 V
Lead Channel Setting Pacing Pulse Width: 0.4 ms
MDC IDC MSMT BATTERY VOLTAGE: 2.82 V
MDC IDC MSMT LEADCHNL LV IMPEDANCE VALUE: 494 Ohm
MDC IDC MSMT LEADCHNL LV IMPEDANCE VALUE: 779 Ohm
MDC IDC MSMT LEADCHNL LV PACING THRESHOLD AMPLITUDE: 2 V
MDC IDC MSMT LEADCHNL RA IMPEDANCE VALUE: 513 Ohm
MDC IDC MSMT LEADCHNL RV IMPEDANCE VALUE: 817 Ohm
MDC IDC MSMT LEADCHNL RV PACING THRESHOLD AMPLITUDE: 0.625 V
MDC IDC MSMT LEADCHNL RV PACING THRESHOLD PULSEWIDTH: 0.4 ms
MDC IDC MSMT LEADCHNL RV SENSING INTR AMPL: 18.125 mV
MDC IDC MSMT LEADCHNL RV SENSING INTR AMPL: 18.375 mV
MDC IDC SET LEADCHNL LV PACING PULSEWIDTH: 1 ms
MDC IDC SET LEADCHNL RA PACING AMPLITUDE: 2 V
MDC IDC SET LEADCHNL RV SENSING SENSITIVITY: 0.3 mV
MDC IDC SET ZONE DETECTION INTERVAL: 350 ms
MDC IDC SET ZONE DETECTION INTERVAL: 370 ms
MDC IDC STAT BRADY AP VP PERCENT: 1.71 %
MDC IDC STAT BRADY AP VS PERCENT: 0.02 %
Zone Setting Detection Interval: 300 ms
Zone Setting Detection Interval: 360 ms

## 2015-02-16 LAB — POCT INR: INR: 3.5

## 2015-02-16 NOTE — Assessment & Plan Note (Signed)
Her symptoms are class 2 but she is limited by arthritis. She will continue her current meds and maintain a low sodium diet.

## 2015-02-16 NOTE — Assessment & Plan Note (Signed)
I have encouraged the patient to lose weight. She is very sedentary and I instructed her on chair exercises.

## 2015-02-16 NOTE — Patient Instructions (Addendum)
Medication Instructions: - no changes  Labwork: - none  Procedures/Testing: - none  Follow-Up: - Remote monitoring is used to monitor your Pacemaker of ICD from home. This monitoring reduces the number of office visits required to check your device to one time per year. It allows [place] to keep an eye on the functioning of your device to ensure it is working properly. You are scheduled for a device check from home on 05/18/15. You may send your transmission at any time that day. If you have a wireless device, the transmission will be sent automatically. After your physician reviews your transmission, you will receive a postcard with your next transmission date.  - Your physician wants you to follow-up in: 1 year with Dr. Lovena Le. You will receive a reminder letter in the mail two months in advance. If you don't receive a letter, please call our office to schedule the follow-up appointment.  Any Additional Special Instructions Will Be Listed Below (If Applicable). - none

## 2015-02-16 NOTE — Assessment & Plan Note (Signed)
Her medtronic BiV ICD is working normally. Will recheck in several months. 

## 2015-02-16 NOTE — Progress Notes (Signed)
HPI Bianca [name] returns today for followup. She is a 81 yo woman with a h/o an ICM, chronic systolic CHF, LBBB, s/p BiV ICD implant. She has had problems with fluid overload and dietary indiscretion. She is now weighing herself. No chest pain. Still class 2 symptoms. Minimal edema. She has not been in the hospital but does admit to dietary indiscretion. She has had increased dyspnea and minimal fluid buildup. She is bothered mostly by arthritis. She lost her husband 10 months ago and is struggling.  Allergies  Allergen Reactions  . Quinapril Hcl Swelling    Tongue and throat  . Neosporin [Neomycin-Polymyxin-Gramicidin] Rash     Current Outpatient Prescriptions  Medication Sig Dispense Refill  . allopurinol (ZYLOPRIM) 100 MG tablet Take 100 mg by mouth daily. Takes along with a 300 mg tablet to equal 400 mg    . allopurinol (ZYLOPRIM) 300 MG tablet Take 300 mg by mouth daily. Takes along with a 100mg  tablet to equal 400mg     . Calcium Carbonate-Vitamin D (CALCIUM 600+D) 600-400 MG-UNIT per tablet Take 1 tablet by mouth 2 (two) times daily.    . carvedilol (COREG) 12.5 MG tablet Take 1 tablet (12.5 mg total) by mouth 2 (two) times daily. 180 tablet 1  . Cholecalciferol (VITAMIN D3) 1000 UNITS CAPS Take 1 capsule by mouth daily.    . colestipol (COLESTID) 1 G tablet Take 1 g by mouth at bedtime.    . furosemide (LASIX) 40 MG tablet Take 20 mg by mouth daily.    Marland Kitchen gabapentin (NEURONTIN) 800 MG tablet Take 800 mg by mouth 3 (three) times daily.     Marland Kitchen gemfibrozil (LOPID) 600 MG tablet Take 600 mg by mouth 2 (two) times daily before a meal.    . losartan (COZAAR) 25 MG tablet Take 25 mg by mouth daily.    . Multiple Vitamin (MULTIVITAMIN) tablet Take 1 tablet by mouth daily.    . nitroGLYCERIN (NITROSTAT) 0.4 MG SL tablet Place 1 tablet (0.4 mg total) under the tongue every 5 (five) minutes as needed for chest pain. 25 tablet 6  . nortriptyline (PAMELOR) 25 MG capsule Take 25 mg by mouth at  bedtime.    . pravastatin (PRAVACHOL) 80 MG tablet Take 1 tablet (80 mg total) by mouth every evening. 90 tablet 3  . spironolactone (ALDACTONE) 25 MG tablet Take 1 tablet (25 mg total) by mouth daily. 90 tablet 3  . warfarin (COUMADIN) 5 MG tablet Take as directed by [facility] 125 tablet 1   No current facility-administered medications for this visit.     Past Medical History  Diagnosis Date  . Pacemaker   . ICD (implantable cardiac defibrillator) in place   . Myocardial infarction 2008  . Sleep apnea   . Arthritis   . Neuromuscular disorder   . CHF (congestive heart failure)   . Hypertension   . Dysrhythmia   . Chronic systolic heart failure   . Acute systolic heart failure     ROS:   All systems reviewed and negative except as noted in the HPI.   Past Surgical History  Procedure Laterality Date  . Cholecystectomy    . Abdominal hysterectomy    . Ankle surgery    . Insert / replace / remove pacemaker  2008  . Knee ligament reconstruction  2012  . Tonsillectomy    . Cardiac catheterization    . Eye surgery       Family History  Problem Relation Age of  Onset  . Tuberculosis Father   . Stroke Mother   . Congestive Heart Failure Mother   . Diabetes Brother   . Stroke Brother   . Cancer Brother   . Heart attack Son      Social History   Social History  . Marital Status: Married    Spouse Name: N/A  . Number of Children: N/A  . Years of Education: N/A   Occupational History  . Not on file.   Social History Main Topics  . Smoking status: Former Research scientist (life sciences)  . Smokeless tobacco: Former Systems developer  . Alcohol Use: No  . Drug Use: No  . Sexual Activity: Not Currently   Other Topics Concern  . Not on file   Social History Narrative     BP 106/68 mmHg  Pulse 77  Ht 5\' 6"  (1.676 m)  Wt 249 lb 6.4 oz (113.127 kg)  BMI 40.27 kg/m2  Physical Exam:  Well appearing 81 yo woman, NAD HEENT: Unremarkable Neck:  7 cm JVD, no thyromegally Lungs:  Clear  with no wheezes. HEART:  Regular rate rhythm, no murmurs, no rubs, no clicks Abd:  soft, positive bowel sounds, no organomegally, no rebound, no guarding Ext:  2 plus pulses, no edema, no cyanosis, no clubbing Skin:  No rashes no nodules Neuro:  CN II through XII intact, motor grossly intact   DEVICE  Normal device function.  See PaceArt for details.   Assess/Plan:

## 2015-03-04 ENCOUNTER — Ambulatory Visit (INDEPENDENT_AMBULATORY_CARE_PROVIDER_SITE_OTHER): Payer: Medicare Other | Admitting: *Deleted

## 2015-03-04 DIAGNOSIS — Z5181 Encounter for therapeutic drug level monitoring: Secondary | ICD-10-CM | POA: Diagnosis not present

## 2015-03-04 DIAGNOSIS — I4891 Unspecified atrial fibrillation: Secondary | ICD-10-CM

## 2015-03-04 LAB — POCT INR: INR: 3.2

## 2015-03-15 ENCOUNTER — Other Ambulatory Visit: Payer: Self-pay

## 2015-03-15 MED ORDER — CARVEDILOL 12.5 MG PO TABS: 12.5000 mg | ORAL_TABLET | Freq: Two times a day (BID) | ORAL | Status: DC

## 2015-03-15 NOTE — Telephone Encounter (Signed)
Jerline Pain, MD at 12/28/2014 10:18 AM  carvedilol (COREG) 12.5 MG tabletTake 1 tablet (12.5 mg total) by mouth 2 (two) times daily Medication Instructions:  Your physician recommends that you continue on your current medications as directed. Please refer to the Current Medication list given to you today

## 2015-03-18 ENCOUNTER — Ambulatory Visit (INDEPENDENT_AMBULATORY_CARE_PROVIDER_SITE_OTHER): Payer: Medicare Other | Admitting: Pharmacist

## 2015-03-18 DIAGNOSIS — I4891 Unspecified atrial fibrillation: Secondary | ICD-10-CM

## 2015-03-18 DIAGNOSIS — Z5181 Encounter for therapeutic drug level monitoring: Secondary | ICD-10-CM

## 2015-03-18 LAB — POCT INR: INR: 2.8

## 2015-04-11 ENCOUNTER — Other Ambulatory Visit: Payer: Self-pay | Admitting: *Deleted

## 2015-04-11 DIAGNOSIS — Z95810 Presence of automatic (implantable) cardiac defibrillator: Secondary | ICD-10-CM

## 2015-04-11 DIAGNOSIS — I4891 Unspecified atrial fibrillation: Secondary | ICD-10-CM

## 2015-04-11 MED ORDER — WARFARIN SODIUM 5 MG PO TABS: ORAL_TABLET | ORAL | Status: DC

## 2015-04-11 MED ORDER — SPIRONOLACTONE 25 MG PO TABS: 25.0000 mg | ORAL_TABLET | Freq: Every day | ORAL | Status: DC

## 2015-04-13 ENCOUNTER — Ambulatory Visit (INDEPENDENT_AMBULATORY_CARE_PROVIDER_SITE_OTHER): Payer: Medicare Other | Admitting: *Deleted

## 2015-04-13 DIAGNOSIS — Z5181 Encounter for therapeutic drug level monitoring: Secondary | ICD-10-CM | POA: Diagnosis not present

## 2015-04-13 DIAGNOSIS — I4891 Unspecified atrial fibrillation: Secondary | ICD-10-CM

## 2015-04-13 LAB — POCT INR: INR: 2.4

## 2015-05-11 ENCOUNTER — Ambulatory Visit (INDEPENDENT_AMBULATORY_CARE_PROVIDER_SITE_OTHER): Payer: Medicare Other | Admitting: *Deleted

## 2015-05-11 DIAGNOSIS — Z5181 Encounter for therapeutic drug level monitoring: Secondary | ICD-10-CM | POA: Diagnosis not present

## 2015-05-11 DIAGNOSIS — I4891 Unspecified atrial fibrillation: Secondary | ICD-10-CM

## 2015-05-11 LAB — POCT INR: INR: 3.7

## 2015-05-18 ENCOUNTER — Ambulatory Visit (INDEPENDENT_AMBULATORY_CARE_PROVIDER_SITE_OTHER): Payer: Medicare Other | Admitting: *Deleted

## 2015-05-18 DIAGNOSIS — I429 Cardiomyopathy, unspecified: Secondary | ICD-10-CM

## 2015-05-18 DIAGNOSIS — I5022 Chronic systolic (congestive) heart failure: Secondary | ICD-10-CM | POA: Diagnosis not present

## 2015-05-20 NOTE — Progress Notes (Signed)
Remote ICD transmission.   

## 2015-05-25 ENCOUNTER — Ambulatory Visit (INDEPENDENT_AMBULATORY_CARE_PROVIDER_SITE_OTHER): Payer: Medicare Other | Admitting: Pharmacist

## 2015-05-25 DIAGNOSIS — Z5181 Encounter for therapeutic drug level monitoring: Secondary | ICD-10-CM | POA: Diagnosis not present

## 2015-05-25 DIAGNOSIS — I4891 Unspecified atrial fibrillation: Secondary | ICD-10-CM | POA: Diagnosis not present

## 2015-05-25 LAB — POCT INR: INR: 2.7

## 2015-05-27 ENCOUNTER — Emergency Department (HOSPITAL_COMMUNITY)
Admission: EM | Admit: 2015-05-27 | Discharge: 2015-05-27 | Disposition: A | Discharge status: Home / Self Care | Payer: Medicare Other | Attending: Emergency Medicine | Admitting: Emergency Medicine

## 2015-05-27 ENCOUNTER — Encounter (HOSPITAL_COMMUNITY): Payer: Self-pay

## 2015-05-27 ENCOUNTER — Emergency Department (HOSPITAL_COMMUNITY): Payer: Medicare Other

## 2015-05-27 DIAGNOSIS — I5040 Unspecified combined systolic (congestive) and diastolic (congestive) heart failure: Secondary | ICD-10-CM | POA: Insufficient documentation

## 2015-05-27 DIAGNOSIS — Z9889 Other specified postprocedural states: Secondary | ICD-10-CM | POA: Insufficient documentation

## 2015-05-27 DIAGNOSIS — Z7901 Long term (current) use of anticoagulants: Secondary | ICD-10-CM | POA: Diagnosis not present

## 2015-05-27 DIAGNOSIS — I252 Old myocardial infarction: Secondary | ICD-10-CM | POA: Insufficient documentation

## 2015-05-27 DIAGNOSIS — Z87891 Personal history of nicotine dependence: Secondary | ICD-10-CM | POA: Insufficient documentation

## 2015-05-27 DIAGNOSIS — W01198A Fall on same level from slipping, tripping and stumbling with subsequent striking against other object, initial encounter: Secondary | ICD-10-CM | POA: Diagnosis not present

## 2015-05-27 DIAGNOSIS — I129 Hypertensive chronic kidney disease with stage 1 through stage 4 chronic kidney disease, or unspecified chronic kidney disease: Secondary | ICD-10-CM | POA: Diagnosis not present

## 2015-05-27 DIAGNOSIS — Y998 Other external cause status: Secondary | ICD-10-CM | POA: Diagnosis not present

## 2015-05-27 DIAGNOSIS — S0990XA Unspecified injury of head, initial encounter: Secondary | ICD-10-CM | POA: Diagnosis present

## 2015-05-27 DIAGNOSIS — S0003XA Contusion of scalp, initial encounter: Secondary | ICD-10-CM | POA: Insufficient documentation

## 2015-05-27 DIAGNOSIS — G629 Polyneuropathy, unspecified: Secondary | ICD-10-CM | POA: Insufficient documentation

## 2015-05-27 DIAGNOSIS — W19XXXA Unspecified fall, initial encounter: Secondary | ICD-10-CM

## 2015-05-27 DIAGNOSIS — Y92193 Bedroom in other specified residential institution as the place of occurrence of the external cause: Secondary | ICD-10-CM | POA: Diagnosis not present

## 2015-05-27 DIAGNOSIS — M1990 Unspecified osteoarthritis, unspecified site: Secondary | ICD-10-CM | POA: Diagnosis not present

## 2015-05-27 DIAGNOSIS — Z79899 Other long term (current) drug therapy: Secondary | ICD-10-CM | POA: Insufficient documentation

## 2015-05-27 DIAGNOSIS — Y9389 Activity, other specified: Secondary | ICD-10-CM | POA: Insufficient documentation

## 2015-05-27 DIAGNOSIS — Z95810 Presence of automatic (implantable) cardiac defibrillator: Secondary | ICD-10-CM | POA: Insufficient documentation

## 2015-05-27 DIAGNOSIS — S0083XA Contusion of other part of head, initial encounter: Secondary | ICD-10-CM

## 2015-05-27 DIAGNOSIS — N183 Chronic kidney disease, stage 3 (moderate): Secondary | ICD-10-CM | POA: Insufficient documentation

## 2015-05-27 LAB — CBC WITH DIFFERENTIAL/PLATELET
BASOS PCT: 0 %
Basophils Absolute: 0 10*3/uL (ref 0.0–0.1)
EOS ABS: 0.5 10*3/uL (ref 0.0–0.7)
Eosinophils Relative: 9 %
HEMATOCRIT: 39.4 % (ref 36.0–46.0)
HEMOGLOBIN: 12.4 g/dL (ref 12.0–15.0)
LYMPHS ABS: 2.1 10*3/uL (ref 0.7–4.0)
Lymphocytes Relative: 35 %
MCH: 30.8 pg (ref 26.0–34.0)
MCHC: 31.5 g/dL (ref 30.0–36.0)
MCV: 98 fL (ref 78.0–100.0)
MONO ABS: 0.6 10*3/uL (ref 0.1–1.0)
MONOS PCT: 10 %
NEUTROS PCT: 46 %
Neutro Abs: 2.7 10*3/uL (ref 1.7–7.7)
Platelets: 228 10*3/uL (ref 150–400)
RBC: 4.02 MIL/uL (ref 3.87–5.11)
RDW: 14.3 % (ref 11.5–15.5)
WBC: 5.9 10*3/uL (ref 4.0–10.5)

## 2015-05-27 LAB — PROTIME-INR
INR: 2.74 — AB (ref 0.00–1.49)
PROTHROMBIN TIME: 28.6 s — AB (ref 11.6–15.2)

## 2015-05-27 LAB — BASIC METABOLIC PANEL
Anion gap: 9 (ref 5–15)
BUN: 14 mg/dL (ref 6–20)
CALCIUM: 9.7 mg/dL (ref 8.9–10.3)
CHLORIDE: 103 mmol/L (ref 101–111)
CO2: 28 mmol/L (ref 22–32)
CREATININE: 0.93 mg/dL (ref 0.44–1.00)
GFR calc Af Amer: >60 mL/min (ref >60)
GFR calc non Af Amer: 57 mL/min — ABNORMAL LOW (ref >60)
GLUCOSE: 118 mg/dL — AB (ref 65–99)
Potassium: 4.2 mmol/L (ref 3.5–5.1)
Sodium: 140 mmol/L (ref 135–145)

## 2015-05-27 NOTE — ED Notes (Signed)
Pt reports she woke up this morning and fell in her bedroom hitting head on bedpost.  Pt denies LOC, n/v or any other symptoms.  Pt reports that she has neuropathy and just got overbalanced.  Pt denies becoming dizzy or weak prior to fall.  Pt is currently on Coumadin.  Pt has wound to top of head but pt reports that's from having basal cell removed and is not related to fall.  Pt has hematoma to occipital region of head.

## 2015-05-27 NOTE — Discharge Instructions (Signed)
Read the information below.  You may return to the Emergency Department at any time for worsening condition or any new symptoms that concern you.  You have had a head injury which does not appear to require admission at this time. A concussion is a state of changed mental ability from trauma. SEEK IMMEDIATE MEDICAL ATTENTION IF: There is confusion or drowsiness (although children frequently become drowsy after injury).  You cannot awaken the injured person.  There is nausea (feeling sick to your stomach) or continued, forceful vomiting.  You notice dizziness or unsteadiness which is getting worse, or inability to walk.  You have convulsions or unconsciousness.  You experience severe, persistent headaches not relieved by Tylenol?. (Do not take aspirin as this impairs clotting abilities). Take other pain medications only as directed.  You cannot use arms or legs normally.  There are changes in pupil sizes. (This is the black center in the colored part of the eye)  There is clear or bloody discharge from the nose or ears.  Change in speech, vision, swallowing, or understanding.  Localized weakness, numbness, tingling, or change in bowel or bladder control.   Hematoma A hematoma is a collection of blood under the skin, in an organ, in a body space, in a joint space, or in other tissue. The blood can clot to form a lump that you can see and feel. The lump is often firm and may sometimes become sore and tender. Most hematomas get better in a few days to weeks. However, some hematomas may be serious and require medical care. Hematomas can range in size from very small to very large. CAUSES  A hematoma can be caused by a blunt or penetrating injury. It can also be caused by spontaneous leakage from a blood vessel under the skin. Spontaneous leakage from a blood vessel is more likely to occur in older people, especially those taking blood thinners. Sometimes, a hematoma can develop after certain medical  procedures. SIGNS AND SYMPTOMS   A firm lump on the body.  Possible pain and tenderness in the area.  Bruising.Blue, dark blue, purple-red, or yellowish skin may appear at the site of the hematoma if the hematoma is close to the surface of the skin. For hematomas in deeper tissues or body spaces, the signs and symptoms may be subtle. For example, an intra-abdominal hematoma may cause abdominal pain, weakness, fainting, and shortness of breath. An intracranial hematoma may cause a headache or symptoms such as weakness, trouble speaking, or a change in consciousness. DIAGNOSIS  A hematoma can usually be diagnosed based on your medical history and a physical exam. Imaging tests may be needed if your health care provider suspects a hematoma in deeper tissues or body spaces, such as the abdomen, head, or chest. These tests may include ultrasonography or a CT scan.  TREATMENT  Hematomas usually go away on their own over time. Rarely does the blood need to be drained out of the body. Large hematomas or those that may affect vital organs will sometimes need surgical drainage or monitoring. HOME CARE INSTRUCTIONS   Apply ice to the injured area:   Put ice in a plastic bag.   Place a towel between your skin and the bag.   Leave the ice on for 20 minutes, 2-3 times a day for the first 1 to 2 days.   After the first 2 days, switch to using warm compresses on the hematoma.   Elevate the injured area to help decrease pain and  swelling. Wrapping the area with an elastic bandage may also be helpful. Compression helps to reduce swelling and promotes shrinking of the hematoma. Make sure the bandage is not wrapped too tight.   If your hematoma is on a lower extremity and is painful, crutches may be helpful for a couple days.   Only take over-the-counter or prescription medicines as directed by your health care provider. SEEK IMMEDIATE MEDICAL CARE IF:   You have increasing pain, or your pain is  not controlled with medicine.   You have a fever.   You have worsening swelling or discoloration.   Your skin over the hematoma breaks or starts bleeding.   Your hematoma is in your chest or abdomen and you have weakness, shortness of breath, or a change in consciousness.  Your hematoma is on your scalp (caused by a fall or injury) and you have a worsening headache or a change in alertness or consciousness. MAKE SURE YOU:   Understand these instructions.  Will watch your condition.  Will get help right away if you are not doing well or get worse.   This information is not intended to replace advice given to you by your health care provider. Make sure you discuss any questions you have with your health care provider.   Document Released: 01/17/2004 Document Revised: 02/04/2013 Document Reviewed: 11/12/2012 Elsevier Interactive Patient Education 2016 [place] Injury, Adult You have a head injury. Headaches and throwing up (vomiting) are common after a head injury. It should be easy to wake up from sleeping. Sometimes you must stay in the hospital. Most problems happen within the first 24 hours. Side effects may occur up to 7-10 days after the injury.  WHAT ARE THE TYPES OF HEAD INJURIES? Head injuries can be as minor as a bump. Some head injuries can be more severe. More severe head injuries include:  A jarring injury to the brain (concussion).  A bruise of the brain (contusion). This mean there is bleeding in the brain that can cause swelling.  A cracked skull (skull fracture).  Bleeding in the brain that collects, clots, and forms a bump (hematoma). WHEN SHOULD I GET HELP RIGHT AWAY?   You are confused or sleepy.  You cannot be woken up.  You feel sick to your stomach (nauseous) or keep throwing up (vomiting).  Your dizziness or unsteadiness is getting worse.  You have very bad, lasting headaches that are not helped by medicine. Take medicines only as  told by your doctor.  You cannot use your arms or legs like normal.  You cannot walk.  You notice changes in the black spots in the center of the colored part of your eye (pupil).  You have clear or bloody fluid coming from your nose or ears.  You have trouble seeing. During the next 24 hours after the injury, you must stay with someone who can watch you. This person should get help right away (call 911 in the [place] if you start to shake and are not able to control it (have seizures), you pass out, or you are unable to wake up. HOW CAN I PREVENT A HEAD INJURY IN THE FUTURE?  Wear seat belts.  Wear a helmet while bike riding and playing sports like football.  Stay away from dangerous activities around the house. WHEN CAN I RETURN TO NORMAL ACTIVITIES AND ATHLETICS? See your doctor before doing these activities. You should not do normal activities or play contact sports until 1 week after  the following symptoms have stopped:  Headache that does not go away.  Dizziness.  Poor attention.  Confusion.  Memory problems.  Sickness to your stomach or throwing up.  Tiredness.  Fussiness.  Bothered by bright lights or loud noises.  Anxiousness or depression.  Restless sleep. MAKE SURE YOU:   Understand these instructions.  Will watch your condition.  Will get help right away if you are not doing well or get worse.   This information is not intended to replace advice given to you by your health care provider. Make sure you discuss any questions you have with your health care provider.   Document Released: 05/17/2008 Document Revised: 06/25/2014 Document Reviewed: 02/09/2013 Elsevier Interactive Patient Education [employer].

## 2015-05-27 NOTE — ED Provider Notes (Signed)
CSN: [id number]     Arrival date & time 05/27/15  [id number] History   First MD Initiated Contact with Patient 05/27/15 [phone]     Chief Complaint  Patient presents with  . Fall  . Head Injury     (Consider location/radiation/quality/duration/timing/severity/associated sxs/prior Treatment) HPI   Pt with hx heart failure (EF 35%), implanted medtronic BiV ICD,afib on coumadin, CKDIII, peripheral neuropathy, morbid obesity, presents with pain in the back of her head after falling.  States she has chronic bilateral lower extremity neuropathy that she describes as decreased sensation, making it more difficult to feel her legs and move around normally.  States she was reaching for her walker and "overbalanced," falling toward her bed.  As she fell she was able to grab onto the bedpost and swung around, hitting the back of her head on the post.  Denies LOC.  Denies pain anywhere else.  Denies focal neurologic deficits, neuropathy is unchanged from baseline.  She denies any recent ICD shocks that she is aware of, any CP, SOB, palpitations.  No dizziness or lightheadedness.    Past Medical History  Diagnosis Date  . Pacemaker   . ICD (implantable cardiac defibrillator) in place   . Myocardial infarction ([place]) 2008  . Sleep apnea   . Arthritis   . Neuromuscular disorder ([place])   . CHF (congestive heart failure) ([place])   . Hypertension   . Dysrhythmia   . Chronic systolic heart failure ([place])   . Acute systolic heart failure [facility])    Past Surgical History  Procedure Laterality Date  . Cholecystectomy    . Abdominal hysterectomy    . Ankle surgery    . Insert / replace / remove pacemaker  2008  . Knee ligament reconstruction  2012  . Tonsillectomy    . Cardiac catheterization    . Eye surgery     Family History  Problem Relation Age of Onset  . Tuberculosis Father   . Stroke Mother   . Congestive Heart Failure Mother   . Diabetes Brother   . Stroke Brother   . Cancer Brother   . Heart attack  Son    Social History  Substance Use Topics  . Smoking status: Former Research scientist (life sciences)  . Smokeless tobacco: Former Systems developer  . Alcohol Use: No   OB History    No data available     Review of Systems  Constitutional: Negative for activity change and appetite change.  Cardiovascular: Negative for chest pain.  Gastrointestinal: Negative for abdominal pain.  Musculoskeletal: Negative for neck pain.  Skin: Positive for wound.  Neurological: Positive for numbness and headaches. Negative for dizziness, weakness and light-headedness.  Hematological: Bruises/bleeds easily.  Psychiatric/Behavioral: Negative for self-injury.      Allergies  Quinapril hcl and Neosporin  Home Medications   Prior to Admission medications   Medication Sig Start Date End Date Taking? Authorizing Provider  allopurinol (ZYLOPRIM) 100 MG tablet Take 100 mg by mouth daily. Takes along with a 300 mg tablet to equal 400 mg    Historical Provider, MD  allopurinol (ZYLOPRIM) 300 MG tablet Take 300 mg by mouth daily. Takes along with a 100mg  tablet to equal 400mg  12/09/11   Historical Provider, MD  Calcium Carbonate-Vitamin D (CALCIUM 600+D) 600-400 MG-UNIT per tablet Take 1 tablet by mouth 2 (two) times daily.    Historical Provider, MD  carvedilol (COREG) 12.5 MG tablet Take 1 tablet (12.5 mg total) by mouth 2 (two) times daily. 03/15/15   Thana Farr  Skains, MD  Cholecalciferol (VITAMIN D3) 1000 UNITS CAPS Take 1 capsule by mouth daily.    Historical Provider, MD  colestipol (COLESTID) 1 G tablet Take 1 g by mouth at bedtime.    Historical Provider, MD  furosemide (LASIX) 40 MG tablet Take 20 mg by mouth daily.    Historical Provider, MD  gabapentin (NEURONTIN) 800 MG tablet Take 800 mg by mouth 3 (three) times daily.     Historical Provider, MD  gemfibrozil (LOPID) 600 MG tablet Take 600 mg by mouth 2 (two) times daily before a meal.    Historical Provider, MD  losartan (COZAAR) 25 MG tablet Take 25 mg by mouth daily.    Historical  Provider, MD  Multiple Vitamin (MULTIVITAMIN) tablet Take 1 tablet by mouth daily.    Historical Provider, MD  nitroGLYCERIN (NITROSTAT) 0.4 MG SL tablet Place 1 tablet (0.4 mg total) under the tongue every 5 (five) minutes as needed for chest pain. 11/24/12   Jettie Booze, MD  nortriptyline (PAMELOR) 25 MG capsule Take 25 mg by mouth at bedtime.    Historical Provider, MD  pravastatin (PRAVACHOL) 80 MG tablet Take 1 tablet (80 mg total) by mouth every evening. 09/28/14   Jerline Pain, MD  spironolactone (ALDACTONE) 25 MG tablet Take 1 tablet (25 mg total) by mouth daily. 04/11/15   Jerline Pain, MD  warfarin (COUMADIN) 5 MG tablet Take as directed by [facility] 04/11/15   Jerline Pain, MD   BP 124/62 mmHg  Pulse 75  Temp(Src) 97.9 F (36.6 C) (Oral)  Resp 15  Ht 5\' 6"  (1.676 m)  Wt 113.399 kg  BMI 40.37 kg/m2  SpO2 96% Physical Exam  Constitutional: She appears well-developed and well-nourished. No distress.  HENT:  Head: Normocephalic.    Neck: Neck supple.  Cardiovascular: Normal rate and regular rhythm.   Pulmonary/Chest: Effort normal. No respiratory distress. She exhibits no tenderness.  Abdominal: Soft. She exhibits no distension. There is no tenderness.  Neurological: She is alert. She exhibits normal muscle tone.  CN II-XII intact, EOMs intact, no pronator drift, grip strengths equal bilaterally; strength 5/5 in all extremities, sensation intact in all extremities at her baseline (decreased lower extremity sensation bilaterally distal to knee); finger to nose is normal.  Skin: She is not diaphoretic.  Psychiatric: She has a normal mood and affect. Her behavior is normal.  Nursing note and vitals reviewed.   ED Course  Procedures (including critical care time) Labs Review Labs Reviewed  BASIC METABOLIC PANEL - Abnormal; Notable for the following:    Glucose, Bld 118 (*)    GFR calc non Af Amer 57 (*)    All other components within normal limits    PROTIME-INR - Abnormal; Notable for the following:    Prothrombin Time 28.6 (*)    INR 2.74 (*)    All other components within normal limits  CBC WITH DIFFERENTIAL/PLATELET    Imaging Review Ct Head Wo Contrast  05/27/2015  CLINICAL DATA:  Mechanical fall. Hit occiput on bed post. Anticoagulated, on Coumadin. EXAM: CT HEAD WITHOUT CONTRAST CT CERVICAL SPINE WITHOUT CONTRAST TECHNIQUE: Multidetector CT imaging of the head and cervical spine was performed following the standard protocol without intravenous contrast. Multiplanar CT image reconstructions of the cervical spine were also generated. COMPARISON:  CT neck 03/04/2008. FINDINGS: CT HEAD FINDINGS No evidence for acute infarction, hemorrhage, mass lesion, hydrocephalus, or extra-axial fluid. Moderate cerebral and cerebellar atrophy. Extensive hypoattenuation of the white matter, likely  representing chronic microvascular ischemic change. Moderate vascular calcification is noted in the carotid siphons and both vertebral arteries. There is a midline occipital scalp hematoma. There is no underlying skull fracture. No intracranial hemorrhage is evident. Sinuses and mastoids show no air-fluid level. BILATERAL cataract extraction. CT CERVICAL SPINE FINDINGS There is no visible cervical spine fracture, traumatic subluxation, prevertebral soft tissue swelling, or intraspinal hematoma. Multilevel disc disease, most pronounced at C6-C7. Advanced facet arthropathy with a right-sided arthrodesis at C5-C6. Small calcified thyroid nodule stable from 2009. Possible 9 mm RIGHT thyroid nodule incompletely evaluated. Lung apices clear. Mild atherosclerosis. IMPRESSION: A midline occipital scalp hematoma. No underlying skull fracture or intracranial hemorrhage. Moderate atrophy with small vessel disease. No cervical spine fracture or traumatic subluxation. Moderate spondylosis. Electronically Signed   By: Staci Righter M.D.   On: 05/27/2015 11:27   Ct Cervical Spine Wo  Contrast  05/27/2015  CLINICAL DATA:  Mechanical fall. Hit occiput on bed post. Anticoagulated, on Coumadin. EXAM: CT HEAD WITHOUT CONTRAST CT CERVICAL SPINE WITHOUT CONTRAST TECHNIQUE: Multidetector CT imaging of the head and cervical spine was performed following the standard protocol without intravenous contrast. Multiplanar CT image reconstructions of the cervical spine were also generated. COMPARISON:  CT neck 03/04/2008. FINDINGS: CT HEAD FINDINGS No evidence for acute infarction, hemorrhage, mass lesion, hydrocephalus, or extra-axial fluid. Moderate cerebral and cerebellar atrophy. Extensive hypoattenuation of the white matter, likely representing chronic microvascular ischemic change. Moderate vascular calcification is noted in the carotid siphons and both vertebral arteries. There is a midline occipital scalp hematoma. There is no underlying skull fracture. No intracranial hemorrhage is evident. Sinuses and mastoids show no air-fluid level. BILATERAL cataract extraction. CT CERVICAL SPINE FINDINGS There is no visible cervical spine fracture, traumatic subluxation, prevertebral soft tissue swelling, or intraspinal hematoma. Multilevel disc disease, most pronounced at C6-C7. Advanced facet arthropathy with a right-sided arthrodesis at C5-C6. Small calcified thyroid nodule stable from 2009. Possible 9 mm RIGHT thyroid nodule incompletely evaluated. Lung apices clear. Mild atherosclerosis. IMPRESSION: A midline occipital scalp hematoma. No underlying skull fracture or intracranial hemorrhage. Moderate atrophy with small vessel disease. No cervical spine fracture or traumatic subluxation. Moderate spondylosis. Electronically Signed   By: Staci Righter M.D.   On: 05/27/2015 11:27     EKG Interpretation   Date/Time:  Friday May 27 2015 10:29:18 EST Ventricular Rate:  71 PR Interval:  126 QRS Duration: 180 QT Interval:  485 QTC Calculation: 527 R Axis:   -119 Text Interpretation:   Ventricular-paced complexes No further analysis  attempted due to paced rhythm Confirmed by Lita Mains  MD, DAVID ([id number]) on  05/27/2015 12:13:22 PM        MDM   Final diagnoses:  Fall, initial encounter  Traumatic hematoma of occiput, initial encounter    Afebrile, nontoxic patient with mechanical fall on coumadin.  Large hematoma over occiput with discoloration. Dr Lita Mains also saw patient following CT scans, discussed return precautions.  CTs negative for acute findings with exception of known hematoma.  Labs demonstrate mild hyperglycemia and therapeutic INR (2.7), otherwise unremarkable.  D/C home with PCP follow up.  Discussed result, findings, treatment, and follow up  with patient.  Pt given return precautions.  Pt verbalizes understanding and agrees with plan.         Clayton Bibles, PA-C 05/27/15 1213  Julianne Rice, MD 05/27/15 1233

## 2015-05-30 LAB — CUP PACEART REMOTE DEVICE CHECK
Battery Voltage: 2.71 V
Brady Statistic RV Percent Paced: 99.39 %
Date Time Interrogation Session: 20161130062409
HIGH POWER IMPEDANCE MEASURED VALUE: 52 Ohm
HIGH POWER IMPEDANCE MEASURED VALUE: 722 Ohm
HighPow Impedance: 72 Ohm
Implantable Lead Implant Date: 20080821
Implantable Lead Location: 753858
Implantable Lead Location: 753860
Implantable Lead Model: 4194
Implantable Lead Model: 6947
Lead Channel Impedance Value: 1102 Ohm
Lead Channel Impedance Value: 494 Ohm
Lead Channel Impedance Value: 817 Ohm
Lead Channel Impedance Value: 836 Ohm
Lead Channel Pacing Threshold Amplitude: 1.75 V
Lead Channel Pacing Threshold Pulse Width: 0.4 ms
Lead Channel Pacing Threshold Pulse Width: 0.4 ms
Lead Channel Sensing Intrinsic Amplitude: 1.75 mV
Lead Channel Sensing Intrinsic Amplitude: 16.875 mV
Lead Channel Setting Pacing Amplitude: 2 V
Lead Channel Setting Pacing Amplitude: 2.5 V
Lead Channel Setting Pacing Pulse Width: 0.4 ms
MDC IDC LEAD IMPLANT DT: 20080821
MDC IDC LEAD IMPLANT DT: 20080821
MDC IDC LEAD LOCATION: 753859
MDC IDC MSMT LEADCHNL LV PACING THRESHOLD PULSEWIDTH: 1 ms
MDC IDC MSMT LEADCHNL RA IMPEDANCE VALUE: 513 Ohm
MDC IDC MSMT LEADCHNL RA PACING THRESHOLD AMPLITUDE: 0.75 V
MDC IDC MSMT LEADCHNL RA SENSING INTR AMPL: 1.75 mV
MDC IDC MSMT LEADCHNL RV PACING THRESHOLD AMPLITUDE: 0.5 V
MDC IDC MSMT LEADCHNL RV SENSING INTR AMPL: 16.875 mV
MDC IDC SET LEADCHNL LV PACING AMPLITUDE: 2.25 V
MDC IDC SET LEADCHNL LV PACING PULSEWIDTH: 1 ms
MDC IDC SET LEADCHNL RV SENSING SENSITIVITY: 0.3 mV
MDC IDC STAT BRADY AP VP PERCENT: 0.82 %
MDC IDC STAT BRADY AP VS PERCENT: 0.01 %
MDC IDC STAT BRADY AS VP PERCENT: 98.57 %
MDC IDC STAT BRADY AS VS PERCENT: 0.6 %
MDC IDC STAT BRADY RA PERCENT PACED: 0.84 %

## 2015-05-31 ENCOUNTER — Encounter: Payer: Self-pay | Admitting: Cardiology

## 2015-06-19 HISTORY — PX: BASAL CELL CARCINOMA EXCISION: SHX1214

## 2015-06-22 ENCOUNTER — Ambulatory Visit (INDEPENDENT_AMBULATORY_CARE_PROVIDER_SITE_OTHER): Payer: PPO | Admitting: Pharmacist

## 2015-06-22 DIAGNOSIS — Z5181 Encounter for therapeutic drug level monitoring: Secondary | ICD-10-CM | POA: Diagnosis not present

## 2015-06-22 DIAGNOSIS — I4891 Unspecified atrial fibrillation: Secondary | ICD-10-CM

## 2015-06-22 LAB — POCT INR: INR: 2.7

## 2015-07-12 DIAGNOSIS — G4733 Obstructive sleep apnea (adult) (pediatric): Secondary | ICD-10-CM | POA: Diagnosis not present

## 2015-07-20 ENCOUNTER — Ambulatory Visit (INDEPENDENT_AMBULATORY_CARE_PROVIDER_SITE_OTHER): Payer: PPO | Admitting: Pharmacist

## 2015-07-20 DIAGNOSIS — I4891 Unspecified atrial fibrillation: Secondary | ICD-10-CM | POA: Diagnosis not present

## 2015-07-20 DIAGNOSIS — Z5181 Encounter for therapeutic drug level monitoring: Secondary | ICD-10-CM | POA: Diagnosis not present

## 2015-07-20 LAB — POCT INR: INR: 2.7

## 2015-07-25 ENCOUNTER — Encounter: Payer: Self-pay | Admitting: Cardiology

## 2015-07-25 ENCOUNTER — Ambulatory Visit (INDEPENDENT_AMBULATORY_CARE_PROVIDER_SITE_OTHER): Payer: PPO | Admitting: Cardiology

## 2015-07-25 VITALS — BP 102/68 | HR 70 | Ht 66.0 in | Wt 244.4 lb

## 2015-07-25 DIAGNOSIS — I481 Persistent atrial fibrillation: Secondary | ICD-10-CM

## 2015-07-25 DIAGNOSIS — I5022 Chronic systolic (congestive) heart failure: Secondary | ICD-10-CM

## 2015-07-25 DIAGNOSIS — I4819 Other persistent atrial fibrillation: Secondary | ICD-10-CM

## 2015-07-25 DIAGNOSIS — Z95810 Presence of automatic (implantable) cardiac defibrillator: Secondary | ICD-10-CM

## 2015-07-25 DIAGNOSIS — Z7901 Long term (current) use of anticoagulants: Secondary | ICD-10-CM

## 2015-07-25 NOTE — Patient Instructions (Signed)

## 2015-07-25 NOTE — Progress Notes (Signed)
[facility]. [address] [place], [place]  [address] Phone: [phone] Fax:  [phone]  Date:  07/25/2015   ID:  WAI NAJAR, DOB Nov 10, 1932, MRN [id number]  PCP:  Jonathon Bellows, MD   History of Present Illness: Bianca [name] is a 82 y.o. female with nonischemic cardiomyopathy, hx of atrial fibrillation-currently with chronic anticoagulation, hx of Chronic kidney disease-stage III, chronic systolic heart failure-status post Bi- ventricular ICD-followed by Dr. Lovena Le. No defibrillations. EF 35%. Symptoms have been very stable. NYHA class II except for hospitalization for heart failure she was discharged on 11/24/12 with primary diagnosis of acute on chronic systolic heart failure. She was admitted with fluid overload, shortness of breath and diuresed successfully.  She did have periods of hypercarbia with CO2 above 50 with hypersomnolence and has had untreated sleep apnea. Still with mild SOB when talking but not severe. Doing well with CPAP.   Husband had dementia, died. Her family is very helpful with her overall care.  Unfortunately her son has lung cancer that has metastasized. He is 80 years old.    Wt Readings from Last 3 Encounters:  07/25/15 244 lb 6.4 oz (110.859 kg)  05/27/15 250 lb (113.399 kg)  02/16/15 249 lb 6.4 oz (113.127 kg)     Past Medical History  Diagnosis Date  . Pacemaker   . ICD (implantable cardiac defibrillator) in place   . Myocardial infarction ([place]) 2008  . Sleep apnea   . Arthritis   . Neuromuscular disorder ([place])   . CHF (congestive heart failure) ([place])   . Hypertension   . Dysrhythmia   . Chronic systolic heart failure ([place])   . Acute systolic heart failure [facility])     Past Surgical History  Procedure Laterality Date  . Cholecystectomy    . Abdominal hysterectomy    . Ankle surgery    . Insert / replace / remove pacemaker  2008  . Knee ligament reconstruction  2012  . Tonsillectomy    . Cardiac catheterization    . Eye  surgery      Current Outpatient Prescriptions  Medication Sig Dispense Refill  . allopurinol (ZYLOPRIM) 100 MG tablet Take 100 mg by mouth daily. Takes along with a 300 mg tablet to equal 400 mg    . allopurinol (ZYLOPRIM) 300 MG tablet Take 300 mg by mouth daily. Takes along with a 100mg  tablet to equal 400mg     . Calcium Carbonate-Vitamin D (CALCIUM 600+D) 600-400 MG-UNIT per tablet Take 1 tablet by mouth 2 (two) times daily.    . carvedilol (COREG) 12.5 MG tablet Take 1 tablet (12.5 mg total) by mouth 2 (two) times daily. 180 tablet 1  . Cholecalciferol (VITAMIN D3) 1000 UNITS CAPS Take 1 capsule by mouth daily.    . colestipol (COLESTID) 1 G tablet Take 2 g by mouth daily.     . furosemide (LASIX) 40 MG tablet Take 20 mg by mouth daily.    Marland Kitchen gabapentin (NEURONTIN) 800 MG tablet Take 800 mg by mouth 3 (three) times daily.     Marland Kitchen gemfibrozil (LOPID) 600 MG tablet Take 600 mg by mouth 2 (two) times daily before a meal.    . losartan (COZAAR) 25 MG tablet Take 25 mg by mouth daily.    . Multiple Vitamin (MULTIVITAMIN) tablet Take 1 tablet by mouth daily.    . nitroGLYCERIN (NITROSTAT) 0.4 MG SL tablet Place 1 tablet (0.4 mg total) under the tongue every 5 (  five) minutes as needed for chest pain. 25 tablet 6  . nortriptyline (PAMELOR) 25 MG capsule Take 25 mg by mouth at bedtime.    . pravastatin (PRAVACHOL) 80 MG tablet Take 1 tablet (80 mg total) by mouth every evening. 90 tablet 3  . spironolactone (ALDACTONE) 25 MG tablet Take 1 tablet (25 mg total) by mouth daily. 90 tablet 2  . warfarin (COUMADIN) 5 MG tablet PATIENT TAKING 5 MG BY MOUTH DAILY EXCEPT THURSDAYS PATIENTS TAKE 7.5 MG BY MOUTH 1 TIME A DAY     No current facility-administered medications for this visit.    Allergies:    Allergies  Allergen Reactions  . Quinapril Hcl Swelling    Tongue and throat  . Neosporin [Neomycin-Polymyxin-Gramicidin] Rash    Social History:  The patient  reports that she has quit smoking. She  has quit using smokeless tobacco. She reports that she does not drink alcohol or use illicit drugs.   ROS:  Please see the history of present illness. Neuropathy.  Positive for diarrhea, hearing loss, leg swelling, back pain, easy bruising, snoring, difficulty urinating, balance issues. Denies any bleeding, syncope, orthopnea, PND    PHYSICAL EXAM: VS:  BP 102/68 mmHg  Pulse 70  Ht 5\' 6"  (1.676 m)  Wt 244 lb 6.4 oz (110.859 kg)  BMI 39.47 kg/m2 Well nourished, well developed, in no acute distress HEENT: normal Neck: no JVD Cardiac:  normal S1, S2; RRR; no murmur Lungs:  clear to auscultation bilaterally, no wheezing, rhonchi or rales Abd: soft, nontender, no hepatomegalyobese Ext: no edemaright knee brace Skin: warm and dry Neuro: no focal abnormalities noted  EKG:  Today 12/28/14-appears sinus rhythm low voltage P waves seems to be proceeding curette complex with ventricular pacing..    Labs: 07/20/14-triglycerides 332, LDL 80, HDL 39   ASSESSMENT AND PLAN:  1. Atrial fibrillation-currently controlled, chronic anticoagulation in place. CHADS-VASC - 5. Defibrillator/Pacemaker in place.  Dr. Tanna Furry note reviewed. no symptoms of palpitations.  2. Chronic anticoagulation-kidney with warfarin, INR goal 2-3. continue to monitor CBC and basic metabolic profile  3. Nonischemic Cardiomyopathy-EF 35%. Medications reviewed. Currently well compensated. She has been well compensated for years. NYHA 2. Last hospitalization 11/2012. Watch her diet, sodium intashe has lost a few pounds. 4. History of ICD implantation - functioning well. 5. Hyperlipidemia/hypertriglyceridemia- has been on Lopid as well as pravastatin. Baseline triglycerides are greater than 500 which puts her at risk for pancreatitis. We will continue. Education reviewed. 6. Right knee osteoarthrosis  7. Morbid obesity-continue to encourage weight loss.  8. 6 month follow up  Signed, Candee Furbish, MD [facility]  07/25/2015 10:03 AM

## 2015-08-12 DIAGNOSIS — G4733 Obstructive sleep apnea (adult) (pediatric): Secondary | ICD-10-CM | POA: Diagnosis not present

## 2015-08-15 DIAGNOSIS — C44622 Squamous cell carcinoma of skin of right upper limb, including shoulder: Secondary | ICD-10-CM | POA: Diagnosis not present

## 2015-08-15 DIAGNOSIS — D485 Neoplasm of uncertain behavior of skin: Secondary | ICD-10-CM | POA: Diagnosis not present

## 2015-08-16 ENCOUNTER — Ambulatory Visit (INDEPENDENT_AMBULATORY_CARE_PROVIDER_SITE_OTHER): Payer: PPO | Admitting: *Deleted

## 2015-08-16 DIAGNOSIS — I4891 Unspecified atrial fibrillation: Secondary | ICD-10-CM

## 2015-08-16 DIAGNOSIS — Z5181 Encounter for therapeutic drug level monitoring: Secondary | ICD-10-CM

## 2015-08-16 LAB — POCT INR: INR: 3.4

## 2015-08-17 ENCOUNTER — Ambulatory Visit (INDEPENDENT_AMBULATORY_CARE_PROVIDER_SITE_OTHER): Payer: PPO | Admitting: *Deleted

## 2015-08-17 DIAGNOSIS — I5022 Chronic systolic (congestive) heart failure: Secondary | ICD-10-CM

## 2015-08-17 DIAGNOSIS — I429 Cardiomyopathy, unspecified: Secondary | ICD-10-CM

## 2015-08-17 NOTE — Progress Notes (Signed)
Carelink Summary Report / Loop Recorder 

## 2015-08-18 ENCOUNTER — Other Ambulatory Visit: Payer: Self-pay

## 2015-08-18 DIAGNOSIS — I5022 Chronic systolic (congestive) heart failure: Secondary | ICD-10-CM

## 2015-08-18 MED ORDER — CARVEDILOL 12.5 MG PO TABS: 12.5000 mg | ORAL_TABLET | Freq: Two times a day (BID) | ORAL | Status: DC

## 2015-08-18 MED ORDER — PRAVASTATIN SODIUM 80 MG PO TABS: 80.0000 mg | ORAL_TABLET | Freq: Every evening | ORAL | Status: DC

## 2015-08-19 DIAGNOSIS — R58 Hemorrhage, not elsewhere classified: Secondary | ICD-10-CM | POA: Diagnosis not present

## 2015-08-19 DIAGNOSIS — C44212 Basal cell carcinoma of skin of right ear and external auricular canal: Secondary | ICD-10-CM | POA: Diagnosis not present

## 2015-08-31 ENCOUNTER — Ambulatory Visit (INDEPENDENT_AMBULATORY_CARE_PROVIDER_SITE_OTHER): Payer: PPO | Admitting: Pharmacist

## 2015-08-31 DIAGNOSIS — Z5181 Encounter for therapeutic drug level monitoring: Secondary | ICD-10-CM | POA: Diagnosis not present

## 2015-08-31 DIAGNOSIS — I4891 Unspecified atrial fibrillation: Secondary | ICD-10-CM | POA: Diagnosis not present

## 2015-08-31 LAB — POCT INR: INR: 3

## 2015-09-09 DIAGNOSIS — G4733 Obstructive sleep apnea (adult) (pediatric): Secondary | ICD-10-CM | POA: Diagnosis not present

## 2015-09-21 LAB — CUP PACEART REMOTE DEVICE CHECK
Brady Statistic AP VS Percent: 0.02 %
Brady Statistic AS VP Percent: 98.23 %
Date Time Interrogation Session: 20170301072827
HIGH POWER IMPEDANCE MEASURED VALUE: 51 Ohm
HIGH POWER IMPEDANCE MEASURED VALUE: 67 Ohm
HighPow Impedance: 722 Ohm
Implantable Lead Implant Date: 20080821
Implantable Lead Location: 753859
Lead Channel Impedance Value: 1140 Ohm
Lead Channel Impedance Value: 494 Ohm
Lead Channel Impedance Value: 513 Ohm
Lead Channel Impedance Value: 893 Ohm
Lead Channel Pacing Threshold Amplitude: 0.5 V
Lead Channel Pacing Threshold Amplitude: 0.875 V
Lead Channel Pacing Threshold Amplitude: 1.625 V
Lead Channel Pacing Threshold Pulse Width: 0.4 ms
Lead Channel Pacing Threshold Pulse Width: 1 ms
Lead Channel Sensing Intrinsic Amplitude: 19 mV
Lead Channel Sensing Intrinsic Amplitude: 19 mV
Lead Channel Setting Pacing Amplitude: 2.25 V
Lead Channel Setting Pacing Amplitude: 2.5 V
Lead Channel Setting Pacing Pulse Width: 1 ms
Lead Channel Setting Sensing Sensitivity: 0.3 mV
MDC IDC LEAD IMPLANT DT: 20080821
MDC IDC LEAD IMPLANT DT: 20080821
MDC IDC LEAD LOCATION: 753858
MDC IDC LEAD LOCATION: 753860
MDC IDC LEAD MODEL: 4194
MDC IDC MSMT BATTERY VOLTAGE: 2.66 V
MDC IDC MSMT LEADCHNL RA SENSING INTR AMPL: 1.625 mV
MDC IDC MSMT LEADCHNL RA SENSING INTR AMPL: 1.625 mV
MDC IDC MSMT LEADCHNL RV IMPEDANCE VALUE: 836 Ohm
MDC IDC MSMT LEADCHNL RV PACING THRESHOLD PULSEWIDTH: 0.4 ms
MDC IDC SET LEADCHNL RA PACING AMPLITUDE: 2 V
MDC IDC SET LEADCHNL RV PACING PULSEWIDTH: 0.4 ms
MDC IDC STAT BRADY AP VP PERCENT: 1.18 %
MDC IDC STAT BRADY AS VS PERCENT: 0.57 %
MDC IDC STAT BRADY RA PERCENT PACED: 1.2 %
MDC IDC STAT BRADY RV PERCENT PACED: 99.42 %

## 2015-09-27 ENCOUNTER — Encounter: Payer: Self-pay | Admitting: Cardiology

## 2015-09-28 ENCOUNTER — Ambulatory Visit (INDEPENDENT_AMBULATORY_CARE_PROVIDER_SITE_OTHER): Payer: PPO | Admitting: *Deleted

## 2015-09-28 DIAGNOSIS — Z5181 Encounter for therapeutic drug level monitoring: Secondary | ICD-10-CM | POA: Diagnosis not present

## 2015-09-28 DIAGNOSIS — I4891 Unspecified atrial fibrillation: Secondary | ICD-10-CM | POA: Diagnosis not present

## 2015-09-28 LAB — POCT INR: INR: 2.4

## 2015-10-10 DIAGNOSIS — G4733 Obstructive sleep apnea (adult) (pediatric): Secondary | ICD-10-CM | POA: Diagnosis not present

## 2015-10-17 DIAGNOSIS — Z9289 Personal history of other medical treatment: Secondary | ICD-10-CM

## 2015-10-17 DIAGNOSIS — S301XXA Contusion of abdominal wall, initial encounter: Secondary | ICD-10-CM

## 2015-10-17 HISTORY — DX: Contusion of abdominal wall, initial encounter: S30.1XXA

## 2015-10-17 HISTORY — DX: Personal history of other medical treatment: Z92.89

## 2015-10-24 ENCOUNTER — Inpatient Hospital Stay (HOSPITAL_COMMUNITY)
Admission: EM | Admit: 2015-10-24 | Discharge: 2015-11-03 | DRG: 813 | Disposition: A | Discharge status: Skilled Nursing Facility | Payer: PPO | Attending: General Surgery | Admitting: General Surgery

## 2015-10-24 ENCOUNTER — Emergency Department (HOSPITAL_COMMUNITY): Payer: PPO

## 2015-10-24 ENCOUNTER — Encounter (HOSPITAL_COMMUNITY): Payer: Self-pay | Admitting: Emergency Medicine

## 2015-10-24 DIAGNOSIS — D62 Acute posthemorrhagic anemia: Secondary | ICD-10-CM | POA: Diagnosis not present

## 2015-10-24 DIAGNOSIS — R0600 Dyspnea, unspecified: Secondary | ICD-10-CM | POA: Diagnosis not present

## 2015-10-24 DIAGNOSIS — S199XXA Unspecified injury of neck, initial encounter: Secondary | ICD-10-CM | POA: Diagnosis not present

## 2015-10-24 DIAGNOSIS — G5793 Unspecified mononeuropathy of bilateral lower limbs: Secondary | ICD-10-CM | POA: Diagnosis not present

## 2015-10-24 DIAGNOSIS — G4730 Sleep apnea, unspecified: Secondary | ICD-10-CM | POA: Diagnosis present

## 2015-10-24 DIAGNOSIS — R109 Unspecified abdominal pain: Secondary | ICD-10-CM | POA: Diagnosis not present

## 2015-10-24 DIAGNOSIS — I959 Hypotension, unspecified: Secondary | ICD-10-CM | POA: Diagnosis present

## 2015-10-24 DIAGNOSIS — Z7901 Long term (current) use of anticoagulants: Secondary | ICD-10-CM

## 2015-10-24 DIAGNOSIS — T45515A Adverse effect of anticoagulants, initial encounter: Secondary | ICD-10-CM | POA: Diagnosis present

## 2015-10-24 DIAGNOSIS — N390 Urinary tract infection, site not specified: Secondary | ICD-10-CM | POA: Diagnosis not present

## 2015-10-24 DIAGNOSIS — R58 Hemorrhage, not elsewhere classified: Secondary | ICD-10-CM | POA: Diagnosis not present

## 2015-10-24 DIAGNOSIS — I5043 Acute on chronic combined systolic (congestive) and diastolic (congestive) heart failure: Secondary | ICD-10-CM | POA: Diagnosis not present

## 2015-10-24 DIAGNOSIS — Z8249 Family history of ischemic heart disease and other diseases of the circulatory system: Secondary | ICD-10-CM | POA: Diagnosis not present

## 2015-10-24 DIAGNOSIS — T148 Other injury of unspecified body region: Secondary | ICD-10-CM | POA: Diagnosis not present

## 2015-10-24 DIAGNOSIS — B9620 Unspecified Escherichia coli [E. coli] as the cause of diseases classified elsewhere: Secondary | ICD-10-CM | POA: Diagnosis present

## 2015-10-24 DIAGNOSIS — E876 Hypokalemia: Secondary | ICD-10-CM | POA: Diagnosis not present

## 2015-10-24 DIAGNOSIS — I5022 Chronic systolic (congestive) heart failure: Secondary | ICD-10-CM | POA: Diagnosis not present

## 2015-10-24 DIAGNOSIS — I480 Paroxysmal atrial fibrillation: Secondary | ICD-10-CM | POA: Diagnosis not present

## 2015-10-24 DIAGNOSIS — I252 Old myocardial infarction: Secondary | ICD-10-CM

## 2015-10-24 DIAGNOSIS — R259 Unspecified abnormal involuntary movements: Secondary | ICD-10-CM | POA: Diagnosis not present

## 2015-10-24 DIAGNOSIS — Z888 Allergy status to other drugs, medicaments and biological substances status: Secondary | ICD-10-CM

## 2015-10-24 DIAGNOSIS — Z87891 Personal history of nicotine dependence: Secondary | ICD-10-CM | POA: Diagnosis not present

## 2015-10-24 DIAGNOSIS — S0003XA Contusion of scalp, initial encounter: Secondary | ICD-10-CM | POA: Diagnosis not present

## 2015-10-24 DIAGNOSIS — R2689 Other abnormalities of gait and mobility: Secondary | ICD-10-CM | POA: Diagnosis not present

## 2015-10-24 DIAGNOSIS — S299XXA Unspecified injury of thorax, initial encounter: Secondary | ICD-10-CM | POA: Diagnosis not present

## 2015-10-24 DIAGNOSIS — S0990XA Unspecified injury of head, initial encounter: Secondary | ICD-10-CM | POA: Diagnosis not present

## 2015-10-24 DIAGNOSIS — S3993XA Unspecified injury of pelvis, initial encounter: Secondary | ICD-10-CM | POA: Diagnosis not present

## 2015-10-24 DIAGNOSIS — W010XXA Fall on same level from slipping, tripping and stumbling without subsequent striking against object, initial encounter: Secondary | ICD-10-CM | POA: Diagnosis not present

## 2015-10-24 DIAGNOSIS — M1710 Unilateral primary osteoarthritis, unspecified knee: Secondary | ICD-10-CM | POA: Diagnosis present

## 2015-10-24 DIAGNOSIS — R5381 Other malaise: Secondary | ICD-10-CM | POA: Diagnosis present

## 2015-10-24 DIAGNOSIS — G629 Polyneuropathy, unspecified: Secondary | ICD-10-CM | POA: Diagnosis not present

## 2015-10-24 DIAGNOSIS — M6281 Muscle weakness (generalized): Secondary | ICD-10-CM | POA: Diagnosis not present

## 2015-10-24 DIAGNOSIS — I428 Other cardiomyopathies: Secondary | ICD-10-CM | POA: Diagnosis not present

## 2015-10-24 DIAGNOSIS — W19XXXA Unspecified fall, initial encounter: Secondary | ICD-10-CM | POA: Diagnosis present

## 2015-10-24 DIAGNOSIS — I5033 Acute on chronic diastolic (congestive) heart failure: Secondary | ICD-10-CM | POA: Insufficient documentation

## 2015-10-24 DIAGNOSIS — N183 Chronic kidney disease, stage 3 (moderate): Secondary | ICD-10-CM | POA: Diagnosis present

## 2015-10-24 DIAGNOSIS — S301XXA Contusion of abdominal wall, initial encounter: Secondary | ICD-10-CM | POA: Diagnosis not present

## 2015-10-24 DIAGNOSIS — D6832 Hemorrhagic disorder due to extrinsic circulating anticoagulants: Principal | ICD-10-CM | POA: Diagnosis present

## 2015-10-24 DIAGNOSIS — T148XXA Other injury of unspecified body region, initial encounter: Secondary | ICD-10-CM | POA: Insufficient documentation

## 2015-10-24 DIAGNOSIS — Z881 Allergy status to other antibiotic agents status: Secondary | ICD-10-CM | POA: Diagnosis not present

## 2015-10-24 DIAGNOSIS — I481 Persistent atrial fibrillation: Secondary | ICD-10-CM | POA: Diagnosis present

## 2015-10-24 DIAGNOSIS — I130 Hypertensive heart and chronic kidney disease with heart failure and stage 1 through stage 4 chronic kidney disease, or unspecified chronic kidney disease: Secondary | ICD-10-CM | POA: Diagnosis present

## 2015-10-24 DIAGNOSIS — Z6841 Body Mass Index (BMI) 40.0 and over, adult: Secondary | ICD-10-CM | POA: Diagnosis not present

## 2015-10-24 DIAGNOSIS — Z5189 Encounter for other specified aftercare: Secondary | ICD-10-CM | POA: Diagnosis not present

## 2015-10-24 DIAGNOSIS — S3991XA Unspecified injury of abdomen, initial encounter: Secondary | ICD-10-CM | POA: Diagnosis not present

## 2015-10-24 DIAGNOSIS — R296 Repeated falls: Secondary | ICD-10-CM | POA: Diagnosis present

## 2015-10-24 DIAGNOSIS — G4733 Obstructive sleep apnea (adult) (pediatric): Secondary | ICD-10-CM | POA: Insufficient documentation

## 2015-10-24 DIAGNOSIS — R262 Difficulty in walking, not elsewhere classified: Secondary | ICD-10-CM | POA: Diagnosis not present

## 2015-10-24 DIAGNOSIS — S31609A Unspecified open wound of abdominal wall, unspecified quadrant with penetration into peritoneal cavity, initial encounter: Secondary | ICD-10-CM | POA: Diagnosis not present

## 2015-10-24 DIAGNOSIS — I509 Heart failure, unspecified: Secondary | ICD-10-CM | POA: Diagnosis not present

## 2015-10-24 DIAGNOSIS — W19XXXD Unspecified fall, subsequent encounter: Secondary | ICD-10-CM | POA: Diagnosis not present

## 2015-10-24 DIAGNOSIS — Z95810 Presence of automatic (implantable) cardiac defibrillator: Secondary | ICD-10-CM

## 2015-10-24 DIAGNOSIS — R0602 Shortness of breath: Secondary | ICD-10-CM | POA: Diagnosis not present

## 2015-10-24 DIAGNOSIS — R1084 Generalized abdominal pain: Secondary | ICD-10-CM | POA: Diagnosis not present

## 2015-10-24 HISTORY — DX: Acute posthemorrhagic anemia: D62

## 2015-10-24 MED ORDER — ONDANSETRON HCL 4 MG/2ML IJ SOLN
4.0000 mg | Freq: Once | INTRAMUSCULAR | Status: AC
  Administered 2015-10-24: 4 mg via INTRAVENOUS
  Filled 2015-10-24: qty 2

## 2015-10-24 MED ORDER — SODIUM CHLORIDE 0.9 % IV BOLUS (SEPSIS)
1000.0000 mL | Freq: Once | INTRAVENOUS | Status: AC
  Administered 2015-10-24: 1000 mL via INTRAVENOUS

## 2015-10-24 MED ORDER — IOPAMIDOL (ISOVUE-300) INJECTION 61%
INTRAVENOUS | Status: AC
  Administered 2015-10-25: 100 mL
  Filled 2015-10-24: qty 100

## 2015-10-24 NOTE — ED Notes (Signed)
Per EMS, pt from home with c/o fall, with hematoma to the right flank. Pt received total of 8mg  zofran and 136mcg fentanyl. Pt unable to get out of chair on her own. Pt currently on blood thinners. BP-122 palpated

## 2015-10-24 NOTE — ED Provider Notes (Signed)
CSN: [id number]     Arrival date & time 10/24/15  2306 History  By signing my name below, I, Arianna Nassar, attest that this documentation has been prepared under the direction and in the presence of Everlene Balls, MD. Electronically Signed: Julien Nordmann, ED Scribe. 10/24/2015. 11:19 PM.     Chief Complaint  Patient presents with  . Fall      The history is provided by the patient and the EMS personnel. No language interpreter was used.   HPI Comments: WALDEAN FRITZLER is a 82 y.o. female who has a PMHx of ICD, MI, neuromuscular disorder, CHF, HTN, chronic systolic heart failure presents to the Emergency Department by EMS complaining of a sudden onset, gradual worsening, moderate fall onset 2 hours ago. She complains of right sided flank pain. Pt notes she was getting up to get some ice using her walker when she became off balance and fell backwards in her kitchen. She reports hitting the wood in her kitchen down her back while falling. She was able to ambulate after the fall but was unable to get out chair on her own. Pt is currently taking Coumadin. Per EMS, she received 100 mg fentanyl to alleviate her pain with no relief. She denies hitting her head or lose of consciousness.  Past Medical History  Diagnosis Date  . Pacemaker   . ICD (implantable cardiac defibrillator) in place   . Myocardial infarction ([place]) 2008  . Sleep apnea   . Arthritis   . Neuromuscular disorder ([place])   . CHF (congestive heart failure) ([place])   . Hypertension   . Dysrhythmia   . Chronic systolic heart failure ([place])   . Acute systolic heart failure [facility])    Past Surgical History  Procedure Laterality Date  . Cholecystectomy    . Abdominal hysterectomy    . Ankle surgery    . Insert / replace / remove pacemaker  2008  . Knee ligament reconstruction  2012  . Tonsillectomy    . Cardiac catheterization    . Eye surgery     Family History  Problem Relation Age of Onset  . Tuberculosis Father   . Stroke  Mother   . Congestive Heart Failure Mother   . Diabetes Brother   . Stroke Brother   . Cancer Brother   . Heart attack Son    Social History  Substance Use Topics  . Smoking status: Former Research scientist (life sciences)  . Smokeless tobacco: Former Systems developer  . Alcohol Use: No   OB History    No data available     Review of Systems  A complete 10 system review of systems was obtained and all systems are negative except as noted in the HPI and PMH.    Allergies  Quinapril hcl and Neosporin  Home Medications   Prior to Admission medications   Medication Sig Start Date End Date Taking? Authorizing Provider  allopurinol (ZYLOPRIM) 100 MG tablet Take 100 mg by mouth daily. Takes along with a 300 mg tablet to equal 400 mg    Historical Provider, MD  allopurinol (ZYLOPRIM) 300 MG tablet Take 300 mg by mouth daily. Takes along with a 100mg  tablet to equal 400mg  12/09/11   Historical Provider, MD  Calcium Carbonate-Vitamin D (CALCIUM 600+D) 600-400 MG-UNIT per tablet Take 1 tablet by mouth 2 (two) times daily.    Historical Provider, MD  carvedilol (COREG) 12.5 MG tablet Take 1 tablet (12.5 mg total) by mouth 2 (two) times daily. 08/18/15   Elta Guadeloupe  Lurline Del, MD  Cholecalciferol (VITAMIN D3) 1000 UNITS CAPS Take 1 capsule by mouth daily.    Historical Provider, MD  colestipol (COLESTID) 1 G tablet Take 2 g by mouth daily.     Historical Provider, MD  furosemide (LASIX) 40 MG tablet Take 20 mg by mouth daily.    Historical Provider, MD  gabapentin (NEURONTIN) 800 MG tablet Take 800 mg by mouth 3 (three) times daily.     Historical Provider, MD  gemfibrozil (LOPID) 600 MG tablet Take 600 mg by mouth 2 (two) times daily before a meal.    Historical Provider, MD  losartan (COZAAR) 25 MG tablet Take 25 mg by mouth daily.    Historical Provider, MD  Multiple Vitamin (MULTIVITAMIN) tablet Take 1 tablet by mouth daily.    Historical Provider, MD  nitroGLYCERIN (NITROSTAT) 0.4 MG SL tablet Place 1 tablet (0.4 mg total) under the  tongue every 5 (five) minutes as needed for chest pain. 11/24/12   Jettie Booze, MD  nortriptyline (PAMELOR) 25 MG capsule Take 25 mg by mouth at bedtime.    Historical Provider, MD  pravastatin (PRAVACHOL) 80 MG tablet Take 1 tablet (80 mg total) by mouth every evening. 08/18/15   Jerline Pain, MD  spironolactone (ALDACTONE) 25 MG tablet Take 1 tablet (25 mg total) by mouth daily. 04/11/15   Jerline Pain, MD  warfarin (COUMADIN) 5 MG tablet PATIENT TAKING 5 MG BY MOUTH DAILY EXCEPT THURSDAYS PATIENTS TAKE 7.5 MG BY MOUTH 1 TIME A DAY    Historical Provider, MD   Triage vitals: BP 108/57 mmHg  Pulse 83  Temp(Src) 97.4 F (36.3 C) (Oral)  Resp 16  SpO2 90% Physical Exam  Constitutional: She is oriented to person, place, and time. She appears well-developed and well-nourished. No distress.  Pale, drowsy, perioral cyanosis    HENT:  Head: Normocephalic.  Nose: Nose normal.  Mouth/Throat: Oropharynx is clear and moist. No oropharyngeal exudate.  Erythematous abrasion to crown of scalp, nasal canula upper nose   Eyes: Conjunctivae and EOM are normal. Pupils are equal, round, and reactive to light. No scleral icterus.  Neck: Normal range of motion. Neck supple. No JVD present. No tracheal deviation present. No thyromegaly present.  Cardiovascular: Normal rate, regular rhythm and normal heart sounds.  Exam reveals no gallop and no friction rub.   No murmur heard. Pulmonary/Chest: Effort normal and breath sounds normal. No respiratory distress. She has no wheezes. She exhibits no tenderness.  Abdominal: Soft. Bowel sounds are normal. She exhibits no distension and no mass. There is no tenderness. There is no rebound and no guarding.  Musculoskeletal: Normal range of motion. She exhibits tenderness. She exhibits no edema.  Large right flank hematoma TTP  Lymphadenopathy:    She has no cervical adenopathy.  Neurological: She is alert and oriented to person, place, and time. No cranial nerve  deficit. She exhibits normal muscle tone.  Skin: Skin is warm and dry. No rash noted. No erythema. No pallor.  Nursing note and vitals reviewed.   ED Course  Procedures  DIAGNOSTIC STUDIES: Oxygen Saturation is 90% on 2 L/min [place], low by my interpretation.  COORDINATION OF CARE:  11:17 PM Will order CT of head wo contrast, DG pelvis, CT cervical spine wo contrast, CT chest w contrast and CT abdomen w contrast. Discussed treatment plan which includes zofran with pt at bedside and pt agreed to plan.  Labs Review Labs Reviewed  COMPREHENSIVE METABOLIC PANEL - Abnormal; Notable  for the following:    Chloride 98 (*)    Glucose, Bld 193 (*)    BUN 26 (*)    Creatinine, Ser 1.20 (*)    Total Protein 6.2 (*)    ALT 13 (*)    GFR calc non Af Amer 42 (*)    GFR calc Af Amer 48 (*)    All other components within normal limits  CBC - Abnormal; Notable for the following:    WBC 11.6 (*)    RBC 3.25 (*)    Hemoglobin 10.1 (*)    HCT 31.7 (*)    All other components within normal limits  PROTIME-INR - Abnormal; Notable for the following:    Prothrombin Time 28.8 (*)    INR 2.77 (*)    All other components within normal limits  I-STAT CHEM 8, ED - Abnormal; Notable for the following:    Chloride 98 (*)    BUN 27 (*)    Creatinine, Ser 1.10 (*)    Glucose, Bld 178 (*)    Hemoglobin 11.2 (*)    HCT 33.0 (*)    All other components within normal limits  CDS SEROLOGY  ETHANOL  URINALYSIS, ROUTINE W REFLEX MICROSCOPIC (NOT AT [facility])  I-STAT CG4 LACTIC ACID, ED  TYPE AND SCREEN  ABO/RH  PREPARE RBC (CROSSMATCH)  PREPARE FRESH FROZEN PLASMA    Imaging Review Ct Head Wo Contrast  10/25/2015  CLINICAL DATA:  Fall on Coumadin. EXAM: CT HEAD WITHOUT CONTRAST CT CERVICAL SPINE WITHOUT CONTRAST TECHNIQUE: Multidetector CT imaging of the head and cervical spine was performed following the standard protocol without intravenous contrast. Multiplanar CT image reconstructions of the cervical spine  were also generated. COMPARISON:  05/27/2015 FINDINGS: CT HEAD FINDINGS There is no intracranial hemorrhage, mass or evidence of acute infarction. There is moderate generalized atrophy. There is moderate chronic microvascular ischemic change. There is no significant extra-axial fluid collection. No acute intracranial findings are evident. The calvarium and skullbase are intact. The visible paranasal sinuses and orbits are unremarkable. CT CERVICAL SPINE FINDINGS The vertebral column, pedicles and facet articulations are intact. There is no evidence of acute fracture. No acute soft tissue abnormalities are evident. Mild arthritic changes are present. No bone lesion or bony destruction. IMPRESSION: 1. No acute intracranial findings. There is moderate generalized atrophy and chronic appearing white matter hypodensities which likely represent small vessel ischemic disease. 2. Negative for acute cervical spine fracture Electronically Signed   By: Andreas Newport M.D.   On: 10/25/2015 01:18   Ct Chest W Contrast  10/25/2015  CLINICAL DATA:  [place].  Anti coagulated. EXAM: CT CHEST, ABDOMEN, AND PELVIS WITH CONTRAST TECHNIQUE: Multidetector CT imaging of the chest, abdomen and pelvis was performed following the standard protocol during bolus administration of intravenous contrast. CONTRAST:  100 mL Isovue-300 intravenous COMPARISON:  None. FINDINGS: CT CHEST The lungs are clear except for minor chronic appearing posterior base linear opacities which likely represent scarring. There is no pneumothorax. There is no effusion. The airways are patent and intact. No intrathoracic vascular injury. Mediastinum and hila appear unremarkable. No displaced fracture. CT ABDOMEN AND PELVIS There are normal intact appearances of the liver, spleen, pancreas, adrenals and kidneys. Mild diffuse fatty infiltration of the liver without focal lesion. Prior cholecystectomy. Bowel is unremarkable. There is no peritoneal blood or free air. The  abdominal aorta is normal in caliber and intact. Prior hysterectomy.  No adnexal abnormalities. No acute fracture is evident. There is a hematoma of  the subcutaneous tissues in the right flank region, measuring approximately 8 by 14 x 17 cm. There is a moderate volume vascular contrast extravasated into the hematoma, indicating active hemorrhage at the time of the scan. This hematoma is entirely superficial to the abdominal wall musculature. No significant intramuscular hemorrhage. There is no hemorrhage into the peritoneum or retroperitoneum. IMPRESSION: 1. Active hemorrhage into a large subcutaneous hematoma in the right flank region. The hematoma is confined to the subcutaneous tissues. No significant intramuscular component. No hemorrhage into the peritoneum or retroperitoneum. 2. No acute traumatic injury within the chest. No acute findings within the abdomen or pelvis. 3. Fatty liver. 4. These results were called by telephone at the time of interpretation on 10/25/2015 at 1:28 am to Dr. Everlene Balls , who verbally acknowledged these results. Electronically Signed   By: Andreas Newport M.D.   On: 10/25/2015 01:28   Ct Cervical Spine Wo Contrast  10/25/2015  CLINICAL DATA:  Fall on Coumadin. EXAM: CT HEAD WITHOUT CONTRAST CT CERVICAL SPINE WITHOUT CONTRAST TECHNIQUE: Multidetector CT imaging of the head and cervical spine was performed following the standard protocol without intravenous contrast. Multiplanar CT image reconstructions of the cervical spine were also generated. COMPARISON:  05/27/2015 FINDINGS: CT HEAD FINDINGS There is no intracranial hemorrhage, mass or evidence of acute infarction. There is moderate generalized atrophy. There is moderate chronic microvascular ischemic change. There is no significant extra-axial fluid collection. No acute intracranial findings are evident. The calvarium and skullbase are intact. The visible paranasal sinuses and orbits are unremarkable. CT CERVICAL SPINE  FINDINGS The vertebral column, pedicles and facet articulations are intact. There is no evidence of acute fracture. No acute soft tissue abnormalities are evident. Mild arthritic changes are present. No bone lesion or bony destruction. IMPRESSION: 1. No acute intracranial findings. There is moderate generalized atrophy and chronic appearing white matter hypodensities which likely represent small vessel ischemic disease. 2. Negative for acute cervical spine fracture Electronically Signed   By: Andreas Newport M.D.   On: 10/25/2015 01:18   Ct Abdomen Pelvis W Contrast  10/25/2015  CLINICAL DATA:  [place].  Anti coagulated. EXAM: CT CHEST, ABDOMEN, AND PELVIS WITH CONTRAST TECHNIQUE: Multidetector CT imaging of the chest, abdomen and pelvis was performed following the standard protocol during bolus administration of intravenous contrast. CONTRAST:  100 mL Isovue-300 intravenous COMPARISON:  None. FINDINGS: CT CHEST The lungs are clear except for minor chronic appearing posterior base linear opacities which likely represent scarring. There is no pneumothorax. There is no effusion. The airways are patent and intact. No intrathoracic vascular injury. Mediastinum and hila appear unremarkable. No displaced fracture. CT ABDOMEN AND PELVIS There are normal intact appearances of the liver, spleen, pancreas, adrenals and kidneys. Mild diffuse fatty infiltration of the liver without focal lesion. Prior cholecystectomy. Bowel is unremarkable. There is no peritoneal blood or free air. The abdominal aorta is normal in caliber and intact. Prior hysterectomy.  No adnexal abnormalities. No acute fracture is evident. There is a hematoma of the subcutaneous tissues in the right flank region, measuring approximately 8 by 14 x 17 cm. There is a moderate volume vascular contrast extravasated into the hematoma, indicating active hemorrhage at the time of the scan. This hematoma is entirely superficial to the abdominal wall musculature. No  significant intramuscular hemorrhage. There is no hemorrhage into the peritoneum or retroperitoneum. IMPRESSION: 1. Active hemorrhage into a large subcutaneous hematoma in the right flank region. The hematoma is confined to the subcutaneous tissues. No significant  intramuscular component. No hemorrhage into the peritoneum or retroperitoneum. 2. No acute traumatic injury within the chest. No acute findings within the abdomen or pelvis. 3. Fatty liver. 4. These results were called by telephone at the time of interpretation on 10/25/2015 at 1:28 am to Dr. Everlene Balls , who verbally acknowledged these results. Electronically Signed   By: Andreas Newport M.D.   On: 10/25/2015 01:28   Dg Pelvis Portable  10/25/2015  CLINICAL DATA:  [place] tonight.  Anti coagulated. EXAM: PORTABLE PELVIS 1-2 VIEWS COMPARISON:  None. FINDINGS: A single supine portable view of the pelvis is negative for displaced fracture. Pubic symphysis and sacroiliac joints appear intact. Hip joints appear intact. IMPRESSION: Negative. Electronically Signed   By: Andreas Newport M.D.   On: 10/25/2015 00:02   Dg Chest Port 1 View  10/25/2015  CLINICAL DATA:  Fall tonight. On Coumadin. Right pelvic hematoma. Shortness of breath. EXAM: PORTABLE CHEST 1 VIEW COMPARISON:  11/21/2012 FINDINGS: Low lung volumes are again demonstrated. Both lungs remain clear. No evidence of pneumothorax or pleural effusion. Cardiomegaly stable. AICD remains in appropriate position. IMPRESSION: Stable cardiomegaly and low lung volumes.  No acute findings. Electronically Signed   By: Earle Gell M.D.   On: 10/25/2015 00:03   I have personally reviewed and evaluated these images and lab results as part of my medical decision-making.   EKG Interpretation None      MDM   Final diagnoses:  None    Patient presents to the ED for a fall and is on coumadin.  She describes pain on her right flank and has a large hematoma there.  Likely bleeding from the coumadin.  Will  obtain whole body CT scan for evaluation, T&S sent for possible anemia as the patient appears pale.  This is also complicated by her receiving narcotics prehospital.  She is on 4L [place] with O2 sat of 94%.  Will continue to closely monitor.   Ct scan reveals large expanding hematoma with active bleed.  Upon repeat evaluation, patient's hematoma has gotten visibly larger.  BP has decreased as well to 80 systolic.  Patient given vitamin K, FFP, and RBC.  Dr. Barry Dienes has been paged for consultation and possible surgical intervention.  2:55 AM I spoke with Dr. Barry Dienes who recs to consult with IR.  I spoke with Dr. Terald Sleeper with IR who does not believe they will see anything on angio.  It is likely a small subcut artery that is bleeding.  I am continuing to resuscictate her.  She got gotten vitamin K, and 1unit of blood and 1L IVF.  FFP is still pending.  Her BP is now back up to [id number] systolic.  Will page Dr.  Barry Dienes again for admission.  3:43 AM I spoke with Dr. Barry Dienes again who accepts the patient for admission. She agrees with plan for blood and FFP.  Patient continues to appear better with resuscitation in the ED.  SBP remains high 90s to low 100s.   CRITICAL CARE Performed by: Everlene Balls   Total critical care time: 50 minutes - active bleed with hypotension req blood transfusion and coumadin reversal  Critical care time was exclusive of separately billable procedures and treating other patients.  Critical care was necessary to treat or prevent imminent or life-threatening deterioration.  Critical care was time spent personally by me on the following activities: development of treatment plan with patient and/or surrogate as well as nursing, discussions with consultants, evaluation of patient's response to treatment, examination  of patient, obtaining history from patient or surrogate, ordering and performing treatments and interventions, ordering and review of laboratory studies, ordering and review of  radiographic studies, pulse oximetry and re-evaluation of patient's condition.   I personally performed the services described in this documentation, which was scribed in my presence. The recorded information has been reviewed and is accurate.      Everlene Balls, MD 10/25/15 [phone]

## 2015-10-25 ENCOUNTER — Emergency Department (HOSPITAL_COMMUNITY): Payer: PPO

## 2015-10-25 DIAGNOSIS — Z881 Allergy status to other antibiotic agents status: Secondary | ICD-10-CM | POA: Diagnosis not present

## 2015-10-25 DIAGNOSIS — G5793 Unspecified mononeuropathy of bilateral lower limbs: Secondary | ICD-10-CM | POA: Diagnosis not present

## 2015-10-25 DIAGNOSIS — S3993XA Unspecified injury of pelvis, initial encounter: Secondary | ICD-10-CM | POA: Diagnosis not present

## 2015-10-25 DIAGNOSIS — Z888 Allergy status to other drugs, medicaments and biological substances status: Secondary | ICD-10-CM | POA: Diagnosis not present

## 2015-10-25 DIAGNOSIS — T148 Other injury of unspecified body region: Secondary | ICD-10-CM | POA: Diagnosis not present

## 2015-10-25 DIAGNOSIS — Z87891 Personal history of nicotine dependence: Secondary | ICD-10-CM | POA: Diagnosis not present

## 2015-10-25 DIAGNOSIS — I428 Other cardiomyopathies: Secondary | ICD-10-CM | POA: Diagnosis not present

## 2015-10-25 DIAGNOSIS — R109 Unspecified abdominal pain: Secondary | ICD-10-CM | POA: Diagnosis not present

## 2015-10-25 DIAGNOSIS — R1084 Generalized abdominal pain: Secondary | ICD-10-CM | POA: Diagnosis not present

## 2015-10-25 DIAGNOSIS — Z5189 Encounter for other specified aftercare: Secondary | ICD-10-CM | POA: Diagnosis not present

## 2015-10-25 DIAGNOSIS — T45515A Adverse effect of anticoagulants, initial encounter: Secondary | ICD-10-CM | POA: Diagnosis not present

## 2015-10-25 DIAGNOSIS — S31609A Unspecified open wound of abdominal wall, unspecified quadrant with penetration into peritoneal cavity, initial encounter: Secondary | ICD-10-CM | POA: Diagnosis not present

## 2015-10-25 DIAGNOSIS — I509 Heart failure, unspecified: Secondary | ICD-10-CM | POA: Diagnosis not present

## 2015-10-25 DIAGNOSIS — R296 Repeated falls: Secondary | ICD-10-CM | POA: Diagnosis not present

## 2015-10-25 DIAGNOSIS — W19XXXA Unspecified fall, initial encounter: Secondary | ICD-10-CM | POA: Diagnosis present

## 2015-10-25 DIAGNOSIS — G629 Polyneuropathy, unspecified: Secondary | ICD-10-CM | POA: Diagnosis not present

## 2015-10-25 DIAGNOSIS — S0990XA Unspecified injury of head, initial encounter: Secondary | ICD-10-CM | POA: Diagnosis not present

## 2015-10-25 DIAGNOSIS — S301XXA Contusion of abdominal wall, initial encounter: Secondary | ICD-10-CM | POA: Diagnosis not present

## 2015-10-25 DIAGNOSIS — I480 Paroxysmal atrial fibrillation: Secondary | ICD-10-CM

## 2015-10-25 DIAGNOSIS — Z7901 Long term (current) use of anticoagulants: Secondary | ICD-10-CM | POA: Diagnosis not present

## 2015-10-25 DIAGNOSIS — D62 Acute posthemorrhagic anemia: Secondary | ICD-10-CM | POA: Diagnosis not present

## 2015-10-25 DIAGNOSIS — I252 Old myocardial infarction: Secondary | ICD-10-CM | POA: Diagnosis not present

## 2015-10-25 DIAGNOSIS — R5381 Other malaise: Secondary | ICD-10-CM | POA: Diagnosis not present

## 2015-10-25 DIAGNOSIS — R0600 Dyspnea, unspecified: Secondary | ICD-10-CM | POA: Diagnosis not present

## 2015-10-25 DIAGNOSIS — D6832 Hemorrhagic disorder due to extrinsic circulating anticoagulants: Secondary | ICD-10-CM | POA: Diagnosis not present

## 2015-10-25 DIAGNOSIS — I5043 Acute on chronic combined systolic (congestive) and diastolic (congestive) heart failure: Secondary | ICD-10-CM | POA: Diagnosis not present

## 2015-10-25 DIAGNOSIS — I959 Hypotension, unspecified: Secondary | ICD-10-CM | POA: Diagnosis not present

## 2015-10-25 DIAGNOSIS — I130 Hypertensive heart and chronic kidney disease with heart failure and stage 1 through stage 4 chronic kidney disease, or unspecified chronic kidney disease: Secondary | ICD-10-CM | POA: Diagnosis not present

## 2015-10-25 DIAGNOSIS — Z6841 Body Mass Index (BMI) 40.0 and over, adult: Secondary | ICD-10-CM | POA: Diagnosis not present

## 2015-10-25 DIAGNOSIS — S299XXA Unspecified injury of thorax, initial encounter: Secondary | ICD-10-CM | POA: Diagnosis not present

## 2015-10-25 DIAGNOSIS — I5022 Chronic systolic (congestive) heart failure: Secondary | ICD-10-CM

## 2015-10-25 DIAGNOSIS — B9620 Unspecified Escherichia coli [E. coli] as the cause of diseases classified elsewhere: Secondary | ICD-10-CM | POA: Diagnosis not present

## 2015-10-25 DIAGNOSIS — W19XXXD Unspecified fall, subsequent encounter: Secondary | ICD-10-CM | POA: Diagnosis not present

## 2015-10-25 DIAGNOSIS — R262 Difficulty in walking, not elsewhere classified: Secondary | ICD-10-CM | POA: Diagnosis not present

## 2015-10-25 DIAGNOSIS — S0003XA Contusion of scalp, initial encounter: Secondary | ICD-10-CM | POA: Diagnosis not present

## 2015-10-25 DIAGNOSIS — M6281 Muscle weakness (generalized): Secondary | ICD-10-CM | POA: Diagnosis not present

## 2015-10-25 DIAGNOSIS — R259 Unspecified abnormal involuntary movements: Secondary | ICD-10-CM | POA: Diagnosis not present

## 2015-10-25 DIAGNOSIS — Z95810 Presence of automatic (implantable) cardiac defibrillator: Secondary | ICD-10-CM | POA: Diagnosis not present

## 2015-10-25 DIAGNOSIS — I481 Persistent atrial fibrillation: Secondary | ICD-10-CM | POA: Diagnosis not present

## 2015-10-25 DIAGNOSIS — G4730 Sleep apnea, unspecified: Secondary | ICD-10-CM | POA: Diagnosis not present

## 2015-10-25 DIAGNOSIS — S199XXA Unspecified injury of neck, initial encounter: Secondary | ICD-10-CM | POA: Diagnosis not present

## 2015-10-25 DIAGNOSIS — N390 Urinary tract infection, site not specified: Secondary | ICD-10-CM | POA: Diagnosis not present

## 2015-10-25 DIAGNOSIS — N183 Chronic kidney disease, stage 3 (moderate): Secondary | ICD-10-CM | POA: Diagnosis not present

## 2015-10-25 DIAGNOSIS — E876 Hypokalemia: Secondary | ICD-10-CM | POA: Diagnosis not present

## 2015-10-25 DIAGNOSIS — W010XXA Fall on same level from slipping, tripping and stumbling without subsequent striking against object, initial encounter: Secondary | ICD-10-CM | POA: Diagnosis not present

## 2015-10-25 DIAGNOSIS — M1710 Unilateral primary osteoarthritis, unspecified knee: Secondary | ICD-10-CM | POA: Diagnosis not present

## 2015-10-25 DIAGNOSIS — Z8249 Family history of ischemic heart disease and other diseases of the circulatory system: Secondary | ICD-10-CM | POA: Diagnosis not present

## 2015-10-25 DIAGNOSIS — R58 Hemorrhage, not elsewhere classified: Secondary | ICD-10-CM | POA: Diagnosis present

## 2015-10-25 DIAGNOSIS — S3991XA Unspecified injury of abdomen, initial encounter: Secondary | ICD-10-CM | POA: Diagnosis not present

## 2015-10-25 DIAGNOSIS — I5033 Acute on chronic diastolic (congestive) heart failure: Secondary | ICD-10-CM | POA: Diagnosis not present

## 2015-10-25 LAB — I-STAT CHEM 8, ED
BUN: 27 mg/dL — AB (ref 6–20)
CHLORIDE: 98 mmol/L — AB (ref 101–111)
CREATININE: 1.1 mg/dL — AB (ref 0.44–1.00)
Calcium, Ion: 1.21 mmol/L (ref 1.13–1.30)
Glucose, Bld: 178 mg/dL — ABNORMAL HIGH (ref 65–99)
HCT: 33 % — ABNORMAL LOW (ref 36.0–46.0)
Hemoglobin: 11.2 g/dL — ABNORMAL LOW (ref 12.0–15.0)
POTASSIUM: 4.3 mmol/L (ref 3.5–5.1)
SODIUM: 140 mmol/L (ref 135–145)
TCO2: 29 mmol/L (ref 0–100)

## 2015-10-25 LAB — COMPREHENSIVE METABOLIC PANEL
ALBUMIN: 3.5 g/dL (ref 3.5–5.0)
ALT: 13 U/L — AB (ref 14–54)
AST: 20 U/L (ref 15–41)
Alkaline Phosphatase: 42 U/L (ref 38–126)
Anion gap: 12 (ref 5–15)
BILIRUBIN TOTAL: 0.6 mg/dL (ref 0.3–1.2)
BUN: 26 mg/dL — AB (ref 6–20)
CO2: 28 mmol/L (ref 22–32)
CREATININE: 1.2 mg/dL — AB (ref 0.44–1.00)
Calcium: 9.4 mg/dL (ref 8.9–10.3)
Chloride: 98 mmol/L — ABNORMAL LOW (ref 101–111)
GFR calc Af Amer: 48 mL/min — ABNORMAL LOW (ref >60)
GFR, EST NON AFRICAN AMERICAN: 42 mL/min — AB (ref >60)
GLUCOSE: 193 mg/dL — AB (ref 65–99)
POTASSIUM: 4.4 mmol/L (ref 3.5–5.1)
Sodium: 138 mmol/L (ref 135–145)
TOTAL PROTEIN: 6.2 g/dL — AB (ref 6.5–8.1)

## 2015-10-25 LAB — CBC
HCT: 24.2 % — ABNORMAL LOW (ref 36.0–46.0)
HCT: 23.8 % — ABNORMAL LOW (ref 36.0–46.0)
HEMATOCRIT: 22.4 % — AB (ref 36.0–46.0)
HEMATOCRIT: 31.7 % — AB (ref 36.0–46.0)
HEMOGLOBIN: 7.8 g/dL — AB (ref 12.0–15.0)
HEMOGLOBIN: 7.2 g/dL — AB (ref 12.0–15.0)
Hemoglobin: 10.1 g/dL — ABNORMAL LOW (ref 12.0–15.0)
Hemoglobin: 7.8 g/dL — ABNORMAL LOW (ref 12.0–15.0)
MCH: 31.1 pg (ref 26.0–34.0)
MCH: 30 pg (ref 26.0–34.0)
MCH: 30.5 pg (ref 26.0–34.0)
MCH: 29.6 pg (ref 26.0–34.0)
MCHC: 32.8 g/dL (ref 30.0–36.0)
MCHC: 32.2 g/dL (ref 30.0–36.0)
MCHC: 32.1 g/dL (ref 30.0–36.0)
MCHC: 31.9 g/dL (ref 30.0–36.0)
MCV: 92.2 fL (ref 78.0–100.0)
MCV: 93.1 fL (ref 78.0–100.0)
MCV: 93 fL (ref 78.0–100.0)
MCV: 97.5 fL (ref 78.0–100.0)
PLATELETS: 159 10*3/uL (ref 150–400)
PLATELETS: 161 10*3/uL (ref 150–400)
PLATELETS: 218 10*3/uL (ref 150–400)
Platelets: 154 10*3/uL (ref 150–400)
RBC: 3.25 MIL/uL — ABNORMAL LOW (ref 3.87–5.11)
RBC: 2.43 MIL/uL — AB (ref 3.87–5.11)
RBC: 2.56 MIL/uL — AB (ref 3.87–5.11)
RBC: 2.6 MIL/uL — AB (ref 3.87–5.11)
RDW: 17.2 % — AB (ref 11.5–15.5)
RDW: 17.5 % — AB (ref 11.5–15.5)
RDW: 17.4 % — ABNORMAL HIGH (ref 11.5–15.5)
RDW: 14.2 % (ref 11.5–15.5)
WBC: 11.9 10*3/uL — AB (ref 4.0–10.5)
WBC: 13.2 10*3/uL — AB (ref 4.0–10.5)
WBC: 13.3 10*3/uL — ABNORMAL HIGH (ref 4.0–10.5)
WBC: 11.6 10*3/uL — AB (ref 4.0–10.5)

## 2015-10-25 LAB — ETHANOL: Alcohol, Ethyl (B): <5 mg/dL (ref <5)

## 2015-10-25 LAB — URINE MICROSCOPIC-ADD ON

## 2015-10-25 LAB — URINALYSIS, ROUTINE W REFLEX MICROSCOPIC
BILIRUBIN URINE: NEGATIVE
Glucose, UA: NEGATIVE mg/dL
KETONES UR: NEGATIVE mg/dL
NITRITE: POSITIVE — AB
PH: 5.5 (ref 5.0–8.0)
Protein, ur: NEGATIVE mg/dL

## 2015-10-25 LAB — CDS SEROLOGY

## 2015-10-25 LAB — PROTIME-INR
INR: 2.77 — ABNORMAL HIGH (ref 0.00–1.49)
INR: 1.29 (ref 0.00–1.49)
Prothrombin Time: 28.8 seconds — ABNORMAL HIGH (ref 11.6–15.2)
Prothrombin Time: 16.2 seconds — ABNORMAL HIGH (ref 11.6–15.2)

## 2015-10-25 LAB — I-STAT CG4 LACTIC ACID, ED: Lactic Acid, Venous: 1.72 mmol/L (ref 0.5–2.0)

## 2015-10-25 LAB — PREPARE RBC (CROSSMATCH)

## 2015-10-25 LAB — ABO/RH: ABO/RH(D): A POS

## 2015-10-25 LAB — MRSA PCR SCREENING: MRSA BY PCR: NEGATIVE

## 2015-10-25 MED ORDER — ALLOPURINOL 300 MG PO TABS: 300.0000 mg | ORAL_TABLET | Freq: Every day | ORAL | Status: DC

## 2015-10-25 MED ORDER — SPIRONOLACTONE 25 MG PO TABS
25.0000 mg | ORAL_TABLET | Freq: Every day | ORAL | Status: DC
  Filled 2015-10-25: qty 1

## 2015-10-25 MED ORDER — ONDANSETRON HCL 4 MG PO TABS: 4.0000 mg | ORAL_TABLET | Freq: Four times a day (QID) | ORAL | Status: DC | PRN

## 2015-10-25 MED ORDER — SODIUM CHLORIDE 0.9 % IV SOLN
INTRAVENOUS | Status: DC
  Administered 2015-10-25 – 2015-10-26 (×2): via INTRAVENOUS

## 2015-10-25 MED ORDER — ONDANSETRON HCL 4 MG/2ML IJ SOLN
4.0000 mg | Freq: Once | INTRAMUSCULAR | Status: DC
  Filled 2015-10-25: qty 2

## 2015-10-25 MED ORDER — NITROGLYCERIN 0.4 MG SL SUBL: 0.4000 mg | SUBLINGUAL_TABLET | SUBLINGUAL | Status: DC | PRN

## 2015-10-25 MED ORDER — ONDANSETRON HCL 4 MG/2ML IJ SOLN: 4.0000 mg | Freq: Four times a day (QID) | INTRAMUSCULAR | Status: DC | PRN

## 2015-10-25 MED ORDER — COLESTIPOL HCL 1 G PO TABS
2.0000 g | ORAL_TABLET | Freq: Every day | ORAL | Status: DC
  Administered 2015-10-25 – 2015-11-02 (×9): 2 g via ORAL
  Filled 2015-10-25 (×12): qty 2

## 2015-10-25 MED ORDER — ACETAMINOPHEN 325 MG PO TABS
650.0000 mg | ORAL_TABLET | ORAL | Status: DC | PRN
  Administered 2015-10-25 – 2015-11-03 (×16): 650 mg via ORAL
  Filled 2015-10-25 (×17): qty 2

## 2015-10-25 MED ORDER — FUROSEMIDE 10 MG/ML IJ SOLN
40.0000 mg | Freq: Once | INTRAMUSCULAR | Status: AC
  Administered 2015-10-25: 40 mg via INTRAVENOUS
  Filled 2015-10-25: qty 4

## 2015-10-25 MED ORDER — ALLOPURINOL 100 MG PO TABS: 100.0000 mg | ORAL_TABLET | Freq: Every day | ORAL | Status: DC

## 2015-10-25 MED ORDER — ADULT MULTIVITAMIN W/MINERALS CH
1.0000 | ORAL_TABLET | Freq: Every day | ORAL | Status: DC
  Administered 2015-10-25 – 2015-11-03 (×10): 1 via ORAL
  Filled 2015-10-25 (×10): qty 1

## 2015-10-25 MED ORDER — SODIUM CHLORIDE 0.9 % IV SOLN
10.0000 mL/h | Freq: Once | INTRAVENOUS | Status: AC
  Administered 2015-10-25: 10 mL/h via INTRAVENOUS

## 2015-10-25 MED ORDER — FUROSEMIDE 20 MG PO TABS: 20.0000 mg | ORAL_TABLET | Freq: Every day | ORAL | Status: DC

## 2015-10-25 MED ORDER — VITAMIN D 1000 UNITS PO TABS
1000.0000 [IU] | ORAL_TABLET | Freq: Every day | ORAL | Status: DC
  Administered 2015-10-25 – 2015-11-03 (×10): 1000 [IU] via ORAL
  Filled 2015-10-25 (×10): qty 1

## 2015-10-25 MED ORDER — VITAMIN K1 10 MG/ML IJ SOLN
10.0000 mg | Freq: Once | INTRAVENOUS | Status: AC
  Administered 2015-10-25: 10 mg via INTRAVENOUS
  Filled 2015-10-25: qty 1

## 2015-10-25 MED ORDER — PRAVASTATIN SODIUM 40 MG PO TABS
80.0000 mg | ORAL_TABLET | Freq: Every evening | ORAL | Status: DC
  Administered 2015-10-25 – 2015-11-02 (×9): 80 mg via ORAL
  Filled 2015-10-25 (×11): qty 2

## 2015-10-25 MED ORDER — GABAPENTIN 400 MG PO CAPS
800.0000 mg | ORAL_CAPSULE | Freq: Three times a day (TID) | ORAL | Status: DC
  Administered 2015-10-25 – 2015-11-03 (×28): 800 mg via ORAL
  Filled 2015-10-25 (×29): qty 2

## 2015-10-25 MED ORDER — NORTRIPTYLINE HCL 25 MG PO CAPS
25.0000 mg | ORAL_CAPSULE | Freq: Every day | ORAL | Status: DC
  Administered 2015-10-25 – 2015-11-02 (×9): 25 mg via ORAL
  Filled 2015-10-25 (×9): qty 1

## 2015-10-25 MED ORDER — GEMFIBROZIL 600 MG PO TABS
600.0000 mg | ORAL_TABLET | Freq: Two times a day (BID) | ORAL | Status: DC
  Administered 2015-10-25 – 2015-11-03 (×19): 600 mg via ORAL
  Filled 2015-10-25 (×22): qty 1

## 2015-10-25 MED ORDER — DOCUSATE SODIUM 100 MG PO CAPS
100.0000 mg | ORAL_CAPSULE | Freq: Two times a day (BID) | ORAL | Status: DC
  Administered 2015-10-25 – 2015-11-03 (×19): 100 mg via ORAL
  Filled 2015-10-25 (×19): qty 1

## 2015-10-25 MED ORDER — ALLOPURINOL 300 MG PO TABS
400.0000 mg | ORAL_TABLET | Freq: Every day | ORAL | Status: DC
  Administered 2015-10-25 – 2015-11-03 (×10): 400 mg via ORAL
  Filled 2015-10-25 (×10): qty 1

## 2015-10-25 MED ORDER — ONDANSETRON HCL 4 MG/2ML IJ SOLN
4.0000 mg | Freq: Once | INTRAMUSCULAR | Status: AC
  Administered 2015-10-25: 4 mg via INTRAVENOUS

## 2015-10-25 MED ORDER — SODIUM CHLORIDE 0.9 % IV BOLUS (SEPSIS)
1000.0000 mL | Freq: Once | INTRAVENOUS | Status: AC
  Administered 2015-10-25: 1000 mL via INTRAVENOUS

## 2015-10-25 MED ORDER — CARVEDILOL 12.5 MG PO TABS
12.5000 mg | ORAL_TABLET | Freq: Two times a day (BID) | ORAL | Status: DC
  Administered 2015-10-25: 12.5 mg via ORAL
  Filled 2015-10-25: qty 1

## 2015-10-25 MED ORDER — OXYCODONE HCL 5 MG PO TABS
5.0000 mg | ORAL_TABLET | Freq: Four times a day (QID) | ORAL | Status: DC | PRN
  Administered 2015-10-30: 5 mg via ORAL
  Administered 2015-10-31 – 2015-11-02 (×3): 10 mg via ORAL
  Administered 2015-11-02 – 2015-11-03 (×2): 5 mg via ORAL
  Filled 2015-10-25 (×2): qty 1
  Filled 2015-10-25 (×2): qty 2
  Filled 2015-10-25: qty 1
  Filled 2015-10-25: qty 2

## 2015-10-25 MED ORDER — MORPHINE SULFATE (PF) 2 MG/ML IV SOLN
1.0000 mg | INTRAVENOUS | Status: DC | PRN
  Administered 2015-10-27: 1 mg via INTRAVENOUS
  Filled 2015-10-25: qty 1

## 2015-10-25 NOTE — ED Notes (Signed)
Abdominal binding placed per order

## 2015-10-25 NOTE — Consult Note (Signed)
Advanced Heart Failure Team Consult Note  Referring Physician: Dr Hulen Skains Primary Physician: Maurice Small, MD Primary Cardiologist:  Dr Marlou Porch  Reason for Consultation: Numerous blood products in setting of EF 355  HPI:    Bianca [name] is a 82 y.o. female with nonischemic cardiomyopathy, hx of atrial fibrillation-currently with chronic anticoagulation, hx of Chronic kidney disease-stage III, chronic systolic heart failure-status post Bi- ventricular ICD-followed by Dr. Lovena Le. No defibrillations. EF 35%. Symptoms have been very stable. NYHA class II except for hospitalization for heart failure she was discharged on 11/24/12 with primary diagnosis of acute on chronic systolic heart failure.  Admitted 10/24/15 after fall and developing a flank hematoma.  HF team consulted to help manage fluid overload while getting multiple blood products to reverse her INR. Has gotten 4u FFP and 2 RBCs.  Pertinent labs on admission include K 4.3, Creatinine 1.10. INR 2.77.  Comfortable. Denies SOB. SBP 85-100 range  Review of Systems: [y] = yes, [ ]  = no   General: Weight gain [ ] ; Weight loss [ ] ; Anorexia [ ] ; Fatigue [ ] ; Fever [ ] ; Chills [ ] ; Weakness [ ]   Cardiac: Chest pain/pressure [ ] ; Resting SOB [ ] ; Exertional SOB [ ] ; Orthopnea [ ] ; Pedal Edema [ ] ; Palpitations [ ] ; Syncope [ ] ; Presyncope [ ] ; Paroxysmal nocturnal dyspnea[ ]   Pulmonary: Cough [ ] ; Wheezing[ ] ; Hemoptysis[ ] ; Sputum [ ] ; Snoring [ ]   GI: Vomiting[ ] ; Dysphagia[ ] ; Melena[ ] ; Hematochezia [ ] ; Heartburn[ ] ; Abdominal pain [ ] ; Constipation [ ] ; Diarrhea [ ] ; BRBPR [ ]   GU: Hematuria[ ] ; Dysuria [ ] ; Nocturia[ ]   Vascular: Pain in legs with walking [ ] ; Pain in feet with lying flat [ ] ; Non-healing sores [ ] ; Stroke [ ] ; TIA [ ] ; Slurred speech [ ] ;  Neuro: Headaches[ ] ; Vertigo[ ] ; Seizures[ ] ; Paresthesias[ ] ;Blurred vision [ ] ; Diplopia [ ] ; Vision changes [ ]   Ortho/Skin: Arthritis [y]; Joint pain [y]; Muscle pain [ ] ; Joint  swelling [ ] ; Back Pain [ ] ; Rash [ ]   Psych: Depression[ ] ; Anxiety[ ]   Heme: Bleeding problems [y]; Clotting disorders [ ] ; Anemia [ ]   Endocrine: Diabetes [ ] ; Thyroid dysfunction[ ]   Home Medications Prior to Admission medications   Medication Sig Start Date End Date Taking? Authorizing Provider  allopurinol (ZYLOPRIM) 100 MG tablet Take 100 mg by mouth daily. Takes along with a 300 mg tablet to equal 400 mg   Yes Historical Provider, MD  allopurinol (ZYLOPRIM) 300 MG tablet Take 300 mg by mouth daily. Takes along with a 100mg  tablet to equal 400mg  12/09/11  Yes Historical Provider, MD  Calcium Carbonate-Vitamin D (CALCIUM 600+D) 600-400 MG-UNIT per tablet Take 1 tablet by mouth 2 (two) times daily.   Yes Historical Provider, MD  carvedilol (COREG) 12.5 MG tablet Take 1 tablet (12.5 mg total) by mouth 2 (two) times daily. 08/18/15  Yes Jerline Pain, MD  Cholecalciferol (VITAMIN D3) 1000 UNITS CAPS Take 1 capsule by mouth daily.   Yes Historical Provider, MD  colestipol (COLESTID) 1 G tablet Take 2 g by mouth daily.    Yes Historical Provider, MD  furosemide (LASIX) 40 MG tablet Take 20 mg by mouth daily.   Yes Historical Provider, MD  gabapentin (NEURONTIN) 800 MG tablet Take 800 mg by mouth 3 (three) times daily.    Yes Historical Provider, MD  gemfibrozil (LOPID) 600 MG tablet Take 600 mg by mouth 2 (two) times daily before a  meal.   Yes Historical Provider, MD  losartan (COZAAR) 25 MG tablet Take 25 mg by mouth daily.   Yes Historical Provider, MD  Multiple Vitamin (MULTIVITAMIN) tablet Take 1 tablet by mouth daily.   Yes Historical Provider, MD  nitroGLYCERIN (NITROSTAT) 0.4 MG SL tablet Place 1 tablet (0.4 mg total) under the tongue every 5 (five) minutes as needed for chest pain. 11/24/12  Yes Jettie Booze, MD  nortriptyline (PAMELOR) 25 MG capsule Take 25 mg by mouth at bedtime.   Yes Historical Provider, MD  pravastatin (PRAVACHOL) 80 MG tablet Take 1 tablet (80 mg total) by mouth  every evening. 08/18/15  Yes Jerline Pain, MD  spironolactone (ALDACTONE) 25 MG tablet Take 1 tablet (25 mg total) by mouth daily. 04/11/15  Yes Jerline Pain, MD  warfarin (COUMADIN) 5 MG tablet PATIENT TAKING 5 MG BY MOUTH DAILY EXCEPT THURSDAYS PATIENTS TAKE 7.5 MG BY MOUTH 1 TIME A DAY   Yes Historical Provider, MD    Past Medical History: Past Medical History  Diagnosis Date  . Pacemaker   . ICD (implantable cardiac defibrillator) in place   . Myocardial infarction ([place]) 2008  . Sleep apnea   . Arthritis   . Neuromuscular disorder ([place])   . CHF (congestive heart failure) ([place])   . Hypertension   . Dysrhythmia   . Chronic systolic heart failure ([place])   . Acute systolic heart failure [facility])     Past Surgical History: Past Surgical History  Procedure Laterality Date  . Cholecystectomy    . Abdominal hysterectomy    . Ankle surgery    . Insert / replace / remove pacemaker  2008  . Knee ligament reconstruction  2012  . Tonsillectomy    . Cardiac catheterization    . Eye surgery      Family History: Family History  Problem Relation Age of Onset  . Tuberculosis Father   . Stroke Mother   . Congestive Heart Failure Mother   . Diabetes Brother   . Stroke Brother   . Cancer Brother   . Heart attack Son     Social History: Social History   Social History  . Marital Status: Married    Spouse Name: N/A  . Number of Children: N/A  . Years of Education: N/A   Social History Main Topics  . Smoking status: Former Research scientist (life sciences)  . Smokeless tobacco: Former Systems developer  . Alcohol Use: No  . Drug Use: No  . Sexual Activity: Not Currently   Other Topics Concern  . None   Social History Narrative    Allergies:  Allergies  Allergen Reactions  . Quinapril Hcl Swelling    Tongue and throat  . Neosporin [Neomycin-Polymyxin-Gramicidin] Rash    Objective:    Vital Signs:   Temp:  [97.3 F (36.3 C)-98.2 F (36.8 C)] 98 F (36.7 C) (05/09 0836) Pulse Rate:  [50-105] 103  (05/09 0836) Resp:  [13-24] 19 (05/09 0836) BP: (69-119)/(46-99) 88/61 mmHg (05/09 0836) SpO2:  [90 %-100 %] 99 % (05/09 0836) Weight:  [249 lb 5.4 oz (113.1 kg)] 249 lb 5.4 oz (113.1 kg) (05/09 0700) Last BM Date: 10/24/15  Weight change: Filed Weights   10/25/15 0700  Weight: 249 lb 5.4 oz (113.1 kg)    Intake/Output:   Intake/Output Summary (Last 24 hours) at 10/25/15 0855 Last data filed at 10/25/15 0836  Gross per 24 hour  Intake 716.25 ml  Output      0 ml  Net 716.25 ml     Physical Exam: General:  Lying in bed No resp difficulty HEENT: normal Neck: supple. JVP does not appear elevated. Carotids 2+ bilat; no bruits. No lymphadenopathy or thryomegaly appreciated. Cor: PMI nondisplaced. Regular rate & rhythm. No rubs, gallops or murmurs. Lungs: clear Abdomen: obese soft, nondistended. Large flank hematoma, Mildly tender to and around same. No hepatosplenomegaly. No bruits or masses. Good bowel sounds. Extremities: no cyanosis, clubbing, rash, edema Neuro: alert & orientedx3, cranial nerves grossly intact. moves all 4 extremities w/o difficulty. Affect pleasant  Telemetry:  Reviewed, looks like sinus tach, will confirm with EKG.  Labs: Basic Metabolic Panel:  Recent Labs Lab 10/25/15 0050 10/25/15 0103  NA 138 140  K 4.4 4.3  CL 98* 98*  CO2 28  --   GLUCOSE 193* 178*  BUN 26* 27*  CREATININE 1.20* 1.10*  CALCIUM 9.4  --     Liver Function Tests:  Recent Labs Lab 10/25/15 0050  AST 20  ALT 13*  ALKPHOS 42  BILITOT 0.6  PROT 6.2*  ALBUMIN 3.5   No results for input(s): LIPASE, AMYLASE in the last 168 hours. No results for input(s): AMMONIA in the last 168 hours.  CBC:  Recent Labs Lab 10/25/15 0050 10/25/15 0103  WBC 11.6*  --   HGB 10.1* 11.2*  HCT 31.7* 33.0*  MCV 97.5  --   PLT 218  --     Cardiac Enzymes: No results for input(s): CKTOTAL, CKMB, CKMBINDEX, TROPONINI in the last 168 hours.  BNP: BNP (last 3 results) No results  for input(s): BNP in the last 8760 hours.  ProBNP (last 3 results) No results for input(s): PROBNP in the last 8760 hours.   CBG: No results for input(s): GLUCAP in the last 168 hours.  Coagulation Studies:  Recent Labs  10/25/15 0050  LABPROT 28.8*  INR 2.77*   Other Results: EKG : PENDING  Imaging: Ct Head Wo Contrast  10/25/2015  CLINICAL DATA:  Fall on Coumadin. EXAM: CT HEAD WITHOUT CONTRAST CT CERVICAL SPINE WITHOUT CONTRAST TECHNIQUE: Multidetector CT imaging of the head and cervical spine was performed following the standard protocol without intravenous contrast. Multiplanar CT image reconstructions of the cervical spine were also generated. COMPARISON:  05/27/2015 FINDINGS: CT HEAD FINDINGS There is no intracranial hemorrhage, mass or evidence of acute infarction. There is moderate generalized atrophy. There is moderate chronic microvascular ischemic change. There is no significant extra-axial fluid collection. No acute intracranial findings are evident. The calvarium and skullbase are intact. The visible paranasal sinuses and orbits are unremarkable. CT CERVICAL SPINE FINDINGS The vertebral column, pedicles and facet articulations are intact. There is no evidence of acute fracture. No acute soft tissue abnormalities are evident. Mild arthritic changes are present. No bone lesion or bony destruction. IMPRESSION: 1. No acute intracranial findings. There is moderate generalized atrophy and chronic appearing white matter hypodensities which likely represent small vessel ischemic disease. 2. Negative for acute cervical spine fracture Electronically Signed   By: Andreas Newport M.D.   On: 10/25/2015 01:18   Ct Chest W Contrast  10/25/2015  CLINICAL DATA:  [place].  Anti coagulated. EXAM: CT CHEST, ABDOMEN, AND PELVIS WITH CONTRAST TECHNIQUE: Multidetector CT imaging of the chest, abdomen and pelvis was performed following the standard protocol during bolus administration of intravenous  contrast. CONTRAST:  100 mL Isovue-300 intravenous COMPARISON:  None. FINDINGS: CT CHEST The lungs are clear except for minor chronic appearing posterior base linear opacities which likely represent scarring.  There is no pneumothorax. There is no effusion. The airways are patent and intact. No intrathoracic vascular injury. Mediastinum and hila appear unremarkable. No displaced fracture. CT ABDOMEN AND PELVIS There are normal intact appearances of the liver, spleen, pancreas, adrenals and kidneys. Mild diffuse fatty infiltration of the liver without focal lesion. Prior cholecystectomy. Bowel is unremarkable. There is no peritoneal blood or free air. The abdominal aorta is normal in caliber and intact. Prior hysterectomy.  No adnexal abnormalities. No acute fracture is evident. There is a hematoma of the subcutaneous tissues in the right flank region, measuring approximately 8 by 14 x 17 cm. There is a moderate volume vascular contrast extravasated into the hematoma, indicating active hemorrhage at the time of the scan. This hematoma is entirely superficial to the abdominal wall musculature. No significant intramuscular hemorrhage. There is no hemorrhage into the peritoneum or retroperitoneum. IMPRESSION: 1. Active hemorrhage into a large subcutaneous hematoma in the right flank region. The hematoma is confined to the subcutaneous tissues. No significant intramuscular component. No hemorrhage into the peritoneum or retroperitoneum. 2. No acute traumatic injury within the chest. No acute findings within the abdomen or pelvis. 3. Fatty liver. 4. These results were called by telephone at the time of interpretation on 10/25/2015 at 1:28 am to Dr. Everlene Balls , who verbally acknowledged these results. Electronically Signed   By: Andreas Newport M.D.   On: 10/25/2015 01:28   Ct Cervical Spine Wo Contrast  10/25/2015  CLINICAL DATA:  Fall on Coumadin. EXAM: CT HEAD WITHOUT CONTRAST CT CERVICAL SPINE WITHOUT CONTRAST  TECHNIQUE: Multidetector CT imaging of the head and cervical spine was performed following the standard protocol without intravenous contrast. Multiplanar CT image reconstructions of the cervical spine were also generated. COMPARISON:  05/27/2015 FINDINGS: CT HEAD FINDINGS There is no intracranial hemorrhage, mass or evidence of acute infarction. There is moderate generalized atrophy. There is moderate chronic microvascular ischemic change. There is no significant extra-axial fluid collection. No acute intracranial findings are evident. The calvarium and skullbase are intact. The visible paranasal sinuses and orbits are unremarkable. CT CERVICAL SPINE FINDINGS The vertebral column, pedicles and facet articulations are intact. There is no evidence of acute fracture. No acute soft tissue abnormalities are evident. Mild arthritic changes are present. No bone lesion or bony destruction. IMPRESSION: 1. No acute intracranial findings. There is moderate generalized atrophy and chronic appearing white matter hypodensities which likely represent small vessel ischemic disease. 2. Negative for acute cervical spine fracture Electronically Signed   By: Andreas Newport M.D.   On: 10/25/2015 01:18   Ct Abdomen Pelvis W Contrast  10/25/2015  CLINICAL DATA:  [place].  Anti coagulated. EXAM: CT CHEST, ABDOMEN, AND PELVIS WITH CONTRAST TECHNIQUE: Multidetector CT imaging of the chest, abdomen and pelvis was performed following the standard protocol during bolus administration of intravenous contrast. CONTRAST:  100 mL Isovue-300 intravenous COMPARISON:  None. FINDINGS: CT CHEST The lungs are clear except for minor chronic appearing posterior base linear opacities which likely represent scarring. There is no pneumothorax. There is no effusion. The airways are patent and intact. No intrathoracic vascular injury. Mediastinum and hila appear unremarkable. No displaced fracture. CT ABDOMEN AND PELVIS There are normal intact appearances of  the liver, spleen, pancreas, adrenals and kidneys. Mild diffuse fatty infiltration of the liver without focal lesion. Prior cholecystectomy. Bowel is unremarkable. There is no peritoneal blood or free air. The abdominal aorta is normal in caliber and intact. Prior hysterectomy.  No adnexal abnormalities. No acute  fracture is evident. There is a hematoma of the subcutaneous tissues in the right flank region, measuring approximately 8 by 14 x 17 cm. There is a moderate volume vascular contrast extravasated into the hematoma, indicating active hemorrhage at the time of the scan. This hematoma is entirely superficial to the abdominal wall musculature. No significant intramuscular hemorrhage. There is no hemorrhage into the peritoneum or retroperitoneum. IMPRESSION: 1. Active hemorrhage into a large subcutaneous hematoma in the right flank region. The hematoma is confined to the subcutaneous tissues. No significant intramuscular component. No hemorrhage into the peritoneum or retroperitoneum. 2. No acute traumatic injury within the chest. No acute findings within the abdomen or pelvis. 3. Fatty liver. 4. These results were called by telephone at the time of interpretation on 10/25/2015 at 1:28 am to Dr. Everlene Balls , who verbally acknowledged these results. Electronically Signed   By: Andreas Newport M.D.   On: 10/25/2015 01:28   Dg Pelvis Portable  10/25/2015  CLINICAL DATA:  [place] tonight.  Anti coagulated. EXAM: PORTABLE PELVIS 1-2 VIEWS COMPARISON:  None. FINDINGS: A single supine portable view of the pelvis is negative for displaced fracture. Pubic symphysis and sacroiliac joints appear intact. Hip joints appear intact. IMPRESSION: Negative. Electronically Signed   By: Andreas Newport M.D.   On: 10/25/2015 00:02   Dg Chest Port 1 View  10/25/2015  CLINICAL DATA:  Fall tonight. On Coumadin. Right pelvic hematoma. Shortness of breath. EXAM: PORTABLE CHEST 1 VIEW COMPARISON:  11/21/2012 FINDINGS: Low lung volumes  are again demonstrated. Both lungs remain clear. No evidence of pneumothorax or pleural effusion. Cardiomegaly stable. AICD remains in appropriate position. IMPRESSION: Stable cardiomegaly and low lung volumes.  No acute findings. Electronically Signed   By: Earle Gell M.D.   On: 10/25/2015 00:03      Medications:     Current Medications: . allopurinol  400 mg Oral Daily  . carvedilol  12.5 mg Oral BID WC  . cholecalciferol  1,000 Units Oral Daily  . colestipol  2 g Oral Daily  . docusate sodium  100 mg Oral BID  . furosemide  20 mg Oral Daily  . gabapentin  800 mg Oral TID  . gemfibrozil  600 mg Oral BID AC  . multivitamin with minerals  1 tablet Oral Daily  . nortriptyline  25 mg Oral QHS  . ondansetron (ZOFRAN) IV  4 mg Intravenous Once  . pravastatin  80 mg Oral QPM  . spironolactone  25 mg Oral Daily     Infusions: . sodium chloride 50 mL/hr at 10/25/15 0800      Assessment:   1. Fall with large R flank hematoma 2. NICM EF 35% s/p BiV ICD - [employer].  3. AF  4. Morbid obesity with h/o CO2 retention    Plan/Discussion:    Most recent echo 2014.  Will recheck echo, may be improved.   Getting 4 units FFP, 2 UPRBCs.   Looks like she is in sinus tach.  Will check EKG.   Length of Stay: 0  Shirley Friar PA-C 10/25/2015, 8:55 AM  Advanced Heart Failure Team Pager [phone] (M-F; 7a - 4p)  Please contact [facility] for night-coverage after hours (4p -7a ) and weekends on [email or web address]  Patient seen and examined with Oda Kilts, PA-C. We discussed all aspects of the encounter. I agree with the assessment and plan as stated above.   Volume status looks ok despite aggressive blood product support. BP remains low. Respiratory status ok.  Will hold lasix for now. As BP comes up will give one dose IV lasix. Check echo and ECG.  Bensimhon, Daniel,MD 10:34 AM

## 2015-10-25 NOTE — ED Notes (Signed)
Pt back from CT

## 2015-10-25 NOTE — Care Management Note (Signed)
Case Management Note  Patient Details  Name: Bianca [name] MRN: [id number] Date of Birth: [birth year]  Subjective/Objective:  Pt admitted on 10/23/15 s/p fall with large Rt flank hematoma.  PTA, pt resided at home with spouse.  Recently made arrangements to move to [name].                    Action/Plan: Will follow for discharge planning as pt progresses.  Will need PT/OT consults when able to tolerate therapy.    Expected Discharge Date:                  Expected Discharge Plan:     In-House Referral:     Discharge planning Services  CM Consult  Post Acute Care Choice:    Choice offered to:     DME Arranged:    DME Agency:     HH Arranged:    HH Agency:     Status of Service:  In process, will continue to follow  Medicare Important Message Given:    Date Medicare IM Given:    Medicare IM give by:    Date Additional Medicare IM Given:    Additional Medicare Important Message give by:     If discussed at [place] of Stay Meetings, dates discussed:    Additional Comments:  Reinaldo Raddle, RN, BSN  Trauma/Neuro ICU Case Manager [phone]

## 2015-10-25 NOTE — ED Notes (Signed)
IV team bedside. 

## 2015-10-25 NOTE — Progress Notes (Signed)
I spoke with the nurse practitioner on the heart failure team who will see the patient in consultation and help manage her heart failure while getting lots of blood products to reverse her INR.  May be a good idea to get the Eppie Gibson if more reversal is needed.  Kathryne Eriksson. Dahlia Bailiff, MD, [place] [phone] Trauma Surgeon

## 2015-10-25 NOTE — H&P (Signed)
History   Bianca [name] is an 82 y.o. female.   Chief Complaint:  Chief Complaint  Patient presents with  . Fall    Fall This is a new problem. The current episode started yesterday. The problem has been rapidly worsening. Associated symptoms include abdominal pain and arthralgias (back pain). Pertinent negatives include no anorexia, fatigue, headaches, joint swelling, nausea, neck pain, numbness, urinary symptoms, vertigo, visual change or vomiting. Associated symptoms comments: Right flank pain. The symptoms are aggravated by coughing and exertion. She has tried lying down for the symptoms. The treatment provided mild relief.   Pt is an 82 yo F who was moving books onto a bar when she lost her balance and fell, striking the bar.  She and son deny LOC.  She had a hematoma that they could tell was enlarging so they brought her to the OR.  She denies dizziness, headache, nausea/vomiting.   Past Medical History  Diagnosis Date  . Pacemaker   . ICD (implantable cardiac defibrillator) in place   . Myocardial infarction ([place]) 2008  . Sleep apnea   . Arthritis   . Neuromuscular disorder ([place])   . CHF (congestive heart failure) ([place])   . Hypertension   . Dysrhythmia   . Chronic systolic heart failure ([place])   . Acute systolic heart failure [facility])     Past Surgical History  Procedure Laterality Date  . Cholecystectomy    . Abdominal hysterectomy    . Ankle surgery    . Insert / replace / remove pacemaker  2008  . Knee ligament reconstruction  2012  . Tonsillectomy    . Cardiac catheterization    . Eye surgery      Family History  Problem Relation Age of Onset  . Tuberculosis Father   . Stroke Mother   . Congestive Heart Failure Mother   . Diabetes Brother   . Stroke Brother   . Cancer Brother   . Heart attack Son    Social History:  reports that she has quit smoking. She has quit using smokeless tobacco. She reports that she does not drink alcohol or use illicit  drugs.  Allergies   Allergies  Allergen Reactions  . Quinapril Hcl Swelling    Tongue and throat  . Neosporin [Neomycin-Polymyxin-Gramicidin] Rash    Home Medications   Medications Prior to Admission  Medication Sig Dispense Refill  . allopurinol (ZYLOPRIM) 100 MG tablet Take 100 mg by mouth daily. Takes along with a 300 mg tablet to equal 400 mg    . allopurinol (ZYLOPRIM) 300 MG tablet Take 300 mg by mouth daily. Takes along with a 159m tablet to equal 4041m   . Calcium Carbonate-Vitamin D (CALCIUM 600+D) 600-400 MG-UNIT per tablet Take 1 tablet by mouth 2 (two) times daily.    . carvedilol (COREG) 12.5 MG tablet Take 1 tablet (12.5 mg total) by mouth 2 (two) times daily. 180 tablet 1  . Cholecalciferol (VITAMIN D3) 1000 UNITS CAPS Take 1 capsule by mouth daily.    . colestipol (COLESTID) 1 G tablet Take 2 g by mouth daily.     . furosemide (LASIX) 40 MG tablet Take 20 mg by mouth daily.    . Marland Kitchenabapentin (NEURONTIN) 800 MG tablet Take 800 mg by mouth 3 (three) times daily.     . Marland Kitchenemfibrozil (LOPID) 600 MG tablet Take 600 mg by mouth 2 (two) times daily before a meal.    . losartan (COZAAR) 25 MG tablet Take 25  mg by mouth daily.    . Multiple Vitamin (MULTIVITAMIN) tablet Take 1 tablet by mouth daily.    . nitroGLYCERIN (NITROSTAT) 0.4 MG SL tablet Place 1 tablet (0.4 mg total) under the tongue every 5 (five) minutes as needed for chest pain. 25 tablet 6  . nortriptyline (PAMELOR) 25 MG capsule Take 25 mg by mouth at bedtime.    . pravastatin (PRAVACHOL) 80 MG tablet Take 1 tablet (80 mg total) by mouth every evening. 90 tablet 1  . spironolactone (ALDACTONE) 25 MG tablet Take 1 tablet (25 mg total) by mouth daily. 90 tablet 2  . warfarin (COUMADIN) 5 MG tablet PATIENT TAKING 5 MG BY MOUTH DAILY EXCEPT THURSDAYS PATIENTS TAKE 7.5 MG BY MOUTH 1 TIME A DAY      Trauma Course   Results for orders placed or performed during the hospital encounter of 10/24/15 (from the past 48  hour(s))  CDS serology     Status: None   Collection Time: 10/25/15 12:50 AM  Result Value Ref Range   CDS serology specimen      SPECIMEN WILL BE HELD FOR 14 DAYS IF TESTING IS REQUIRED  Comprehensive metabolic panel     Status: Abnormal   Collection Time: 10/25/15 12:50 AM  Result Value Ref Range   Sodium 138 135 - 145 mmol/L   Potassium 4.4 3.5 - 5.1 mmol/L   Chloride 98 (L) 101 - 111 mmol/L   CO2 28 22 - 32 mmol/L   Glucose, Bld 193 (H) 65 - 99 mg/dL   BUN 26 (H) 6 - 20 mg/dL   Creatinine, Ser 1.20 (H) 0.44 - 1.00 mg/dL   Calcium 9.4 8.9 - 10.3 mg/dL   Total Protein 6.2 (L) 6.5 - 8.1 g/dL   Albumin 3.5 3.5 - 5.0 g/dL   AST 20 15 - 41 U/L   ALT 13 (L) 14 - 54 U/L   Alkaline Phosphatase 42 38 - 126 U/L   Total Bilirubin 0.6 0.3 - 1.2 mg/dL   GFR calc non Af Amer 42 (L) >60 mL/min   GFR calc Af Amer 48 (L) >60 mL/min    Comment: (NOTE) The eGFR has been calculated using the CKD EPI equation. This calculation has not been validated in all clinical situations. eGFR's persistently <60 mL/min signify possible Chronic Kidney Disease.    Anion gap 12 5 - 15  CBC     Status: Abnormal   Collection Time: 10/25/15 12:50 AM  Result Value Ref Range   WBC 11.6 (H) 4.0 - 10.5 K/uL   RBC 3.25 (L) 3.87 - 5.11 MIL/uL   Hemoglobin 10.1 (L) 12.0 - 15.0 g/dL   HCT 31.7 (L) 36.0 - 46.0 %   MCV 97.5 78.0 - 100.0 fL   MCH 31.1 26.0 - 34.0 pg   MCHC 31.9 30.0 - 36.0 g/dL   RDW 14.2 11.5 - 15.5 %   Platelets 218 150 - 400 K/uL  Ethanol     Status: None   Collection Time: 10/25/15 12:50 AM  Result Value Ref Range   Alcohol, Ethyl (B) <5 <5 mg/dL    Comment:        LOWEST DETECTABLE LIMIT FOR SERUM ALCOHOL IS 5 mg/dL FOR MEDICAL PURPOSES ONLY   Protime-INR     Status: Abnormal   Collection Time: 10/25/15 12:50 AM  Result Value Ref Range   Prothrombin Time 28.8 (H) 11.6 - 15.2 seconds   INR 2.77 (H) 0.00 - 1.49  Type and screen  Status: None (Preliminary result)   Collection Time:  10/25/15 12:50 AM  Result Value Ref Range   ABO/RH(D) A POS    Antibody Screen NEG    Sample Expiration 10/28/2015    Unit Number [id number]    Blood Component Type RED CELLS,LR    Unit division 00    Status of Unit ISSUED    Transfusion Status OK TO TRANSFUSE    Crossmatch Result Compatible    Unit Number [id number]    Blood Component Type RED CELLS,LR    Unit division 00    Status of Unit ISSUED    Transfusion Status OK TO TRANSFUSE    Crossmatch Result Compatible   Prepare RBC     Status: None   Collection Time: 10/25/15 12:50 AM  Result Value Ref Range   Order Confirmation ORDER PROCESSED BY BLOOD BANK   Prepare fresh frozen plasma     Status: None (Preliminary result)   Collection Time: 10/25/15 12:50 AM  Result Value Ref Range   Unit Number [id number]    Blood Component Type THAWED PLASMA    Unit division 00    Status of Unit ALLOCATED    Transfusion Status OK TO TRANSFUSE    Unit Number [id number]    Blood Component Type THWPLS APHR1    Unit division 00    Status of Unit ALLOCATED    Transfusion Status OK TO TRANSFUSE    Unit Number [id number]    Blood Component Type THAWED PLASMA    Unit division 00    Status of Unit ISSUED    Transfusion Status OK TO TRANSFUSE    Unit Number [id number]    Blood Component Type THAWED PLASMA    Unit division 00    Status of Unit ISSUED    Transfusion Status OK TO TRANSFUSE    Unit Number [id number]    Blood Component Type THAWED PLASMA    Unit division 00    Status of Unit REL FROM [facility]    Transfusion Status OK TO TRANSFUSE   ABO/Rh     Status: None   Collection Time: 10/25/15 12:50 AM  Result Value Ref Range   ABO/RH(D) A POS   I-Stat Chem 8, ED     Status: Abnormal   Collection Time: 10/25/15  1:03 AM  Result Value Ref Range   Sodium 140 135 - 145 mmol/L   Potassium 4.3 3.5 - 5.1 mmol/L   Chloride 98 (L) 101 - 111 mmol/L   BUN 27 (H) 6 - 20 mg/dL   Creatinine, Ser 1.10 (H) 0.44 - 1.00 mg/dL    Glucose, Bld 178 (H) 65 - 99 mg/dL   Calcium, Ion 1.21 1.13 - 1.30 mmol/L   TCO2 29 0 - 100 mmol/L   Hemoglobin 11.2 (L) 12.0 - 15.0 g/dL   HCT 33.0 (L) 36.0 - 46.0 %  I-Stat CG4 Lactic Acid, ED     Status: None   Collection Time: 10/25/15  1:04 AM  Result Value Ref Range   Lactic Acid, Venous 1.72 0.5 - 2.0 mmol/L  Urinalysis, Routine w reflex microscopic     Status: Abnormal   Collection Time: 10/25/15  5:19 AM  Result Value Ref Range   Color, Urine YELLOW YELLOW   APPearance TURBID (A) CLEAR   Specific Gravity, Urine >1.046 (H) 1.005 - 1.030   pH 5.5 5.0 - 8.0   Glucose, UA NEGATIVE NEGATIVE mg/dL   Hgb urine dipstick SMALL (A) NEGATIVE   Bilirubin Urine NEGATIVE NEGATIVE  Ketones, ur NEGATIVE NEGATIVE mg/dL   Protein, ur NEGATIVE NEGATIVE mg/dL   Nitrite POSITIVE (A) NEGATIVE   Leukocytes, UA LARGE (A) NEGATIVE  Urine microscopic-add on     Status: Abnormal   Collection Time: 10/25/15  5:19 AM  Result Value Ref Range   Squamous Epithelial / LPF 6-30 (A) NONE SEEN   WBC, UA TOO NUMEROUS TO COUNT 0 - 5 WBC/hpf   RBC / HPF 0-5 0 - 5 RBC/hpf   Bacteria, UA MANY (A) NONE SEEN   Casts HYALINE CASTS (A) NEGATIVE   Ct Head Wo Contrast  10/25/2015  CLINICAL DATA:  Fall on Coumadin. EXAM: CT HEAD WITHOUT CONTRAST CT CERVICAL SPINE WITHOUT CONTRAST TECHNIQUE: Multidetector CT imaging of the head and cervical spine was performed following the standard protocol without intravenous contrast. Multiplanar CT image reconstructions of the cervical spine were also generated. COMPARISON:  05/27/2015 FINDINGS: CT HEAD FINDINGS There is no intracranial hemorrhage, mass or evidence of acute infarction. There is moderate generalized atrophy. There is moderate chronic microvascular ischemic change. There is no significant extra-axial fluid collection. No acute intracranial findings are evident. The calvarium and skullbase are intact. The visible paranasal sinuses and orbits are unremarkable. CT CERVICAL  SPINE FINDINGS The vertebral column, pedicles and facet articulations are intact. There is no evidence of acute fracture. No acute soft tissue abnormalities are evident. Mild arthritic changes are present. No bone lesion or bony destruction. IMPRESSION: 1. No acute intracranial findings. There is moderate generalized atrophy and chronic appearing white matter hypodensities which likely represent small vessel ischemic disease. 2. Negative for acute cervical spine fracture Electronically Signed   By: Andreas Newport M.D.   On: 10/25/2015 01:18   Ct Chest W Contrast  10/25/2015  CLINICAL DATA:  [place].  Anti coagulated. EXAM: CT CHEST, ABDOMEN, AND PELVIS WITH CONTRAST TECHNIQUE: Multidetector CT imaging of the chest, abdomen and pelvis was performed following the standard protocol during bolus administration of intravenous contrast. CONTRAST:  100 mL Isovue-300 intravenous COMPARISON:  None. FINDINGS: CT CHEST The lungs are clear except for minor chronic appearing posterior base linear opacities which likely represent scarring. There is no pneumothorax. There is no effusion. The airways are patent and intact. No intrathoracic vascular injury. Mediastinum and hila appear unremarkable. No displaced fracture. CT ABDOMEN AND PELVIS There are normal intact appearances of the liver, spleen, pancreas, adrenals and kidneys. Mild diffuse fatty infiltration of the liver without focal lesion. Prior cholecystectomy. Bowel is unremarkable. There is no peritoneal blood or free air. The abdominal aorta is normal in caliber and intact. Prior hysterectomy.  No adnexal abnormalities. No acute fracture is evident. There is a hematoma of the subcutaneous tissues in the right flank region, measuring approximately 8 by 14 x 17 cm. There is a moderate volume vascular contrast extravasated into the hematoma, indicating active hemorrhage at the time of the scan. This hematoma is entirely superficial to the abdominal wall musculature. No  significant intramuscular hemorrhage. There is no hemorrhage into the peritoneum or retroperitoneum. IMPRESSION: 1. Active hemorrhage into a large subcutaneous hematoma in the right flank region. The hematoma is confined to the subcutaneous tissues. No significant intramuscular component. No hemorrhage into the peritoneum or retroperitoneum. 2. No acute traumatic injury within the chest. No acute findings within the abdomen or pelvis. 3. Fatty liver. 4. These results were called by telephone at the time of interpretation on 10/25/2015 at 1:28 am to Dr. Everlene Balls , who verbally acknowledged these results. Electronically Signed  By: Andreas Newport M.D.   On: 10/25/2015 01:28   Ct Cervical Spine Wo Contrast  10/25/2015  CLINICAL DATA:  Fall on Coumadin. EXAM: CT HEAD WITHOUT CONTRAST CT CERVICAL SPINE WITHOUT CONTRAST TECHNIQUE: Multidetector CT imaging of the head and cervical spine was performed following the standard protocol without intravenous contrast. Multiplanar CT image reconstructions of the cervical spine were also generated. COMPARISON:  05/27/2015 FINDINGS: CT HEAD FINDINGS There is no intracranial hemorrhage, mass or evidence of acute infarction. There is moderate generalized atrophy. There is moderate chronic microvascular ischemic change. There is no significant extra-axial fluid collection. No acute intracranial findings are evident. The calvarium and skullbase are intact. The visible paranasal sinuses and orbits are unremarkable. CT CERVICAL SPINE FINDINGS The vertebral column, pedicles and facet articulations are intact. There is no evidence of acute fracture. No acute soft tissue abnormalities are evident. Mild arthritic changes are present. No bone lesion or bony destruction. IMPRESSION: 1. No acute intracranial findings. There is moderate generalized atrophy and chronic appearing white matter hypodensities which likely represent small vessel ischemic disease. 2. Negative for acute cervical  spine fracture Electronically Signed   By: Andreas Newport M.D.   On: 10/25/2015 01:18   Ct Abdomen Pelvis W Contrast  10/25/2015  CLINICAL DATA:  [place].  Anti coagulated. EXAM: CT CHEST, ABDOMEN, AND PELVIS WITH CONTRAST TECHNIQUE: Multidetector CT imaging of the chest, abdomen and pelvis was performed following the standard protocol during bolus administration of intravenous contrast. CONTRAST:  100 mL Isovue-300 intravenous COMPARISON:  None. FINDINGS: CT CHEST The lungs are clear except for minor chronic appearing posterior base linear opacities which likely represent scarring. There is no pneumothorax. There is no effusion. The airways are patent and intact. No intrathoracic vascular injury. Mediastinum and hila appear unremarkable. No displaced fracture. CT ABDOMEN AND PELVIS There are normal intact appearances of the liver, spleen, pancreas, adrenals and kidneys. Mild diffuse fatty infiltration of the liver without focal lesion. Prior cholecystectomy. Bowel is unremarkable. There is no peritoneal blood or free air. The abdominal aorta is normal in caliber and intact. Prior hysterectomy.  No adnexal abnormalities. No acute fracture is evident. There is a hematoma of the subcutaneous tissues in the right flank region, measuring approximately 8 by 14 x 17 cm. There is a moderate volume vascular contrast extravasated into the hematoma, indicating active hemorrhage at the time of the scan. This hematoma is entirely superficial to the abdominal wall musculature. No significant intramuscular hemorrhage. There is no hemorrhage into the peritoneum or retroperitoneum. IMPRESSION: 1. Active hemorrhage into a large subcutaneous hematoma in the right flank region. The hematoma is confined to the subcutaneous tissues. No significant intramuscular component. No hemorrhage into the peritoneum or retroperitoneum. 2. No acute traumatic injury within the chest. No acute findings within the abdomen or pelvis. 3. Fatty liver.  4. These results were called by telephone at the time of interpretation on 10/25/2015 at 1:28 am to Dr. Everlene Balls , who verbally acknowledged these results. Electronically Signed   By: Andreas Newport M.D.   On: 10/25/2015 01:28   Dg Pelvis Portable  10/25/2015  CLINICAL DATA:  [place] tonight.  Anti coagulated. EXAM: PORTABLE PELVIS 1-2 VIEWS COMPARISON:  None. FINDINGS: A single supine portable view of the pelvis is negative for displaced fracture. Pubic symphysis and sacroiliac joints appear intact. Hip joints appear intact. IMPRESSION: Negative. Electronically Signed   By: Andreas Newport M.D.   On: 10/25/2015 00:02   Dg Chest [facility] 1 [address]  10/25/2015  CLINICAL DATA:  Fall tonight. On Coumadin. Right pelvic hematoma. Shortness of breath. EXAM: PORTABLE CHEST 1 VIEW COMPARISON:  11/21/2012 FINDINGS: Low lung volumes are again demonstrated. Both lungs remain clear. No evidence of pneumothorax or pleural effusion. Cardiomegaly stable. AICD remains in appropriate position. IMPRESSION: Stable cardiomegaly and low lung volumes.  No acute findings. Electronically Signed   By: Earle Gell M.D.   On: 10/25/2015 00:03    Review of Systems  Constitutional: Negative.  Negative for fatigue.  Eyes: Negative.   Respiratory: Negative.   Cardiovascular: Negative.   Gastrointestinal: Positive for abdominal pain. Negative for nausea, vomiting and anorexia.  Genitourinary: Negative.   Musculoskeletal: Positive for arthralgias (back pain). Negative for joint swelling and neck pain.  Neurological: Negative.  Negative for vertigo, numbness and headaches.  Endo/Heme/Allergies: Negative.   Psychiatric/Behavioral: Negative.   All other systems reviewed and are negative.   Blood pressure 92/71, pulse 99, temperature 97.4 F (36.3 C), temperature source Oral, resp. rate 19, SpO2 99 %. Physical Exam  Constitutional: She is oriented to person, place, and time. She appears well-developed and well-nourished. She appears  distressed (looks uncomfortable).  HENT:  Head: Normocephalic and atraumatic.  Right Ear: External ear normal.  Left Ear: External ear normal.  Eyes: Conjunctivae are normal. Pupils are equal, round, and reactive to light. No scleral icterus.  Neck: Normal range of motion. Neck supple. No JVD present. No tracheal deviation present. No thyromegaly present.  Cardiovascular: Normal rate, regular rhythm, normal heart sounds and intact distal pulses.   Respiratory: Effort normal and breath sounds normal. No respiratory distress. She has no wheezes. She has no rales. She exhibits no tenderness.  GI: Soft. She exhibits no distension. There is no tenderness. There is no rebound.  Right flank hematoma  Musculoskeletal: Normal range of motion.  Lymphadenopathy:    She has no cervical adenopathy.  Neurological: She is alert and oriented to person, place, and time.  Skin: Skin is warm and dry. No rash noted. She is not diaphoretic. No erythema. No pallor.  Psychiatric: She has a normal mood and affect. Her behavior is normal. Judgment and thought content normal.     Assessment/Plan Fall   Hematoma with active extravasation ABL anemia Acquired anticoagulation.   CHF Debrillaotor OA  Given Vitamin K in ED Getting FFP and pBRBc Will need this followed.    May need additional blood produects. Will follow.  Bette Brienza 10/25/2015, 6:57 AM   Procedures

## 2015-10-25 NOTE — ED Notes (Signed)
Patient transported to CT 

## 2015-10-26 ENCOUNTER — Inpatient Hospital Stay (HOSPITAL_COMMUNITY): Payer: PPO

## 2015-10-26 ENCOUNTER — Telehealth: Payer: Self-pay | Admitting: Pharmacist

## 2015-10-26 DIAGNOSIS — W19XXXA Unspecified fall, initial encounter: Secondary | ICD-10-CM

## 2015-10-26 DIAGNOSIS — T148XXA Other injury of unspecified body region, initial encounter: Secondary | ICD-10-CM | POA: Insufficient documentation

## 2015-10-26 DIAGNOSIS — I5033 Acute on chronic diastolic (congestive) heart failure: Secondary | ICD-10-CM

## 2015-10-26 DIAGNOSIS — T148 Other injury of unspecified body region: Secondary | ICD-10-CM

## 2015-10-26 DIAGNOSIS — I509 Heart failure, unspecified: Secondary | ICD-10-CM

## 2015-10-26 LAB — PREPARE FRESH FROZEN PLASMA
UNIT DIVISION: 0
UNIT DIVISION: 0
UNIT DIVISION: 0
Unit division: 0
Unit division: 0

## 2015-10-26 LAB — CBC
HCT: 20 % — ABNORMAL LOW (ref 36.0–46.0)
HEMATOCRIT: 23.2 % — AB (ref 36.0–46.0)
Hemoglobin: 6.5 g/dL — CL (ref 12.0–15.0)
Hemoglobin: 7.4 g/dL — ABNORMAL LOW (ref 12.0–15.0)
MCH: 29.5 pg (ref 26.0–34.0)
MCH: 27.8 pg (ref 26.0–34.0)
MCHC: 32.5 g/dL (ref 30.0–36.0)
MCHC: 31.9 g/dL (ref 30.0–36.0)
MCV: 90.9 fL (ref 78.0–100.0)
MCV: 87.2 fL (ref 78.0–100.0)
PLATELETS: 140 10*3/uL — AB (ref 150–400)
Platelets: 168 10*3/uL (ref 150–400)
RBC: 2.2 MIL/uL — ABNORMAL LOW (ref 3.87–5.11)
RBC: 2.66 MIL/uL — AB (ref 3.87–5.11)
RDW: 17.3 % — AB (ref 11.5–15.5)
RDW: 20.9 % — AB (ref 11.5–15.5)
WBC: 12.5 10*3/uL — ABNORMAL HIGH (ref 4.0–10.5)
WBC: 11.5 10*3/uL — AB (ref 4.0–10.5)

## 2015-10-26 LAB — COMPREHENSIVE METABOLIC PANEL
ALBUMIN: 3.2 g/dL — AB (ref 3.5–5.0)
ALT: 13 U/L — ABNORMAL LOW (ref 14–54)
ANION GAP: 11 (ref 5–15)
AST: 19 U/L (ref 15–41)
Alkaline Phosphatase: 46 U/L (ref 38–126)
BUN: 28 mg/dL — ABNORMAL HIGH (ref 6–20)
CALCIUM: 8.6 mg/dL — AB (ref 8.9–10.3)
CO2: 29 mmol/L (ref 22–32)
Chloride: 100 mmol/L — ABNORMAL LOW (ref 101–111)
Creatinine, Ser: 1.35 mg/dL — ABNORMAL HIGH (ref 0.44–1.00)
GFR calc non Af Amer: 36 mL/min — ABNORMAL LOW (ref >60)
GFR, EST AFRICAN AMERICAN: 42 mL/min — AB (ref >60)
GLUCOSE: 126 mg/dL — AB (ref 65–99)
POTASSIUM: 4.5 mmol/L (ref 3.5–5.1)
SODIUM: 140 mmol/L (ref 135–145)
TOTAL PROTEIN: 5.7 g/dL — AB (ref 6.5–8.1)
Total Bilirubin: 0.9 mg/dL (ref 0.3–1.2)

## 2015-10-26 LAB — ECHOCARDIOGRAM COMPLETE
HEIGHTINCHES: 66 in
WEIGHTICAEL: 3989.44 [oz_av]

## 2015-10-26 LAB — CBC WITH DIFFERENTIAL/PLATELET
BASOS ABS: 0 10*3/uL (ref 0.0–0.1)
BASOS PCT: 0 %
EOS ABS: 0.4 10*3/uL (ref 0.0–0.7)
Eosinophils Relative: 3 %
HCT: 22.6 % — ABNORMAL LOW (ref 36.0–46.0)
HEMOGLOBIN: 7.5 g/dL — AB (ref 12.0–15.0)
LYMPHS PCT: 19 %
Lymphs Abs: 2.2 10*3/uL (ref 0.7–4.0)
MCH: 29.5 pg (ref 26.0–34.0)
MCHC: 33.2 g/dL (ref 30.0–36.0)
MCV: 89 fL (ref 78.0–100.0)
MONO ABS: 1.2 10*3/uL — AB (ref 0.1–1.0)
Monocytes Relative: 10 %
NEUTROS PCT: 68 %
Neutro Abs: 7.9 10*3/uL — ABNORMAL HIGH (ref 1.7–7.7)
Platelets: 145 10*3/uL — ABNORMAL LOW (ref 150–400)
RBC: 2.54 MIL/uL — AB (ref 3.87–5.11)
RDW: 21.2 % — ABNORMAL HIGH (ref 11.5–15.5)
WBC: 11.7 10*3/uL — AB (ref 4.0–10.5)

## 2015-10-26 LAB — PROTIME-INR
INR: 1.22 (ref 0.00–1.49)
Prothrombin Time: 15.6 seconds — ABNORMAL HIGH (ref 11.6–15.2)

## 2015-10-26 LAB — PREPARE RBC (CROSSMATCH)

## 2015-10-26 MED ORDER — FUROSEMIDE 10 MG/ML IJ SOLN
40.0000 mg | Freq: Once | INTRAMUSCULAR | Status: AC
  Administered 2015-10-26: 40 mg via INTRAVENOUS
  Filled 2015-10-26: qty 4

## 2015-10-26 NOTE — Progress Notes (Signed)
  Echocardiogram 2D Echocardiogram has been performed.  Jennette Dubin 10/26/2015, 11:07 AM

## 2015-10-26 NOTE — Telephone Encounter (Signed)
New Message  Pt dtr in law- calling to inform office that pt fell and is now @ [facility]. Did not need call back

## 2015-10-26 NOTE — Progress Notes (Signed)
CRITICAL VALUE ALERT  Critical value received:  Hgb 6.5   Date of notification:  10/26/2015  Time of notification:  0137  Critical value read back:Yes.    Nurse who received alert:  Vita Barley, RN  MD notified (1st page):  Trauma MD -DrKinsinger  Time of first page:  0139  MD notified (2nd page):  Time of second page:  Responding MD:  Dr. Kieth Brightly  Time MD responded:  [phone]

## 2015-10-26 NOTE — Telephone Encounter (Signed)
Acknowledged that pt is currently admitted to [facility] hospital for treatment.

## 2015-10-26 NOTE — Progress Notes (Signed)
Called Dr. Kieth Brightly with critical Hgb, MD gave orders to transfuse1 unit PRBC's.

## 2015-10-26 NOTE — Progress Notes (Signed)
Trauma Service Note  Subjective: Patient is awake, alert, says that she is feeling well.  No acute distress.  Objective: Vital signs in last 24 hours: Temp:  [97.4 F (36.3 C)-99.5 F (37.5 C)] 98.1 F (36.7 C) (05/10 0800) Pulse Rate:  [79-103] 90 (05/10 0800) Resp:  [11-24] 19 (05/10 0800) BP: (74-124)/(41-65) 113/64 mmHg (05/10 0800) SpO2:  [90 %-100 %] 98 % (05/10 0800) Last BM Date: 10/24/15  Intake/Output from previous day: 05/09 0701 - 05/10 0700 In: 3448.9 [P.O.:480; I.V.:1278.3; Blood:1690.6] Out: 2025 [Urine:2025] Intake/Output this shift: Total I/O In: 50 [I.V.:50] Out: -   General: No acute distress.  Pain is controlled.  Lungs: Clear to auscultatiion.  Abd: RUQ hematoma is softer.  Has gotten another unit of blood since yesterday morning.  Extremities: No clinical signs or symptoms of DVT  Neuro: Intact  Lab Results: CBC   Recent Labs  10/26/15 0112 10/26/15 0655  WBC 12.5* 11.5*  HGB 6.5* 7.4*  HCT 20.0* 23.2*  PLT 168 140*   BMET  Recent Labs  10/25/15 0050 10/25/15 0103 10/26/15 0655  NA 138 140 140  K 4.4 4.3 4.5  CL 98* 98* 100*  CO2 28  --  29  GLUCOSE 193* 178* 126*  BUN 26* 27* 28*  CREATININE 1.20* 1.10* 1.35*  CALCIUM 9.4  --  8.6*   PT/INR  Recent Labs  10/25/15 1238 10/26/15 0655  LABPROT 16.2* 15.6*  INR 1.29 1.22   ABG No results for input(s): PHART, HCO3 in the last 72 hours.  Invalid input(s): PCO2, PO2  Studies/Results: Ct Head Wo Contrast  10/25/2015  CLINICAL DATA:  Fall on Coumadin. EXAM: CT HEAD WITHOUT CONTRAST CT CERVICAL SPINE WITHOUT CONTRAST TECHNIQUE: Multidetector CT imaging of the head and cervical spine was performed following the standard protocol without intravenous contrast. Multiplanar CT image reconstructions of the cervical spine were also generated. COMPARISON:  05/27/2015 FINDINGS: CT HEAD FINDINGS There is no intracranial hemorrhage, mass or evidence of acute infarction. There is  moderate generalized atrophy. There is moderate chronic microvascular ischemic change. There is no significant extra-axial fluid collection. No acute intracranial findings are evident. The calvarium and skullbase are intact. The visible paranasal sinuses and orbits are unremarkable. CT CERVICAL SPINE FINDINGS The vertebral column, pedicles and facet articulations are intact. There is no evidence of acute fracture. No acute soft tissue abnormalities are evident. Mild arthritic changes are present. No bone lesion or bony destruction. IMPRESSION: 1. No acute intracranial findings. There is moderate generalized atrophy and chronic appearing white matter hypodensities which likely represent small vessel ischemic disease. 2. Negative for acute cervical spine fracture Electronically Signed   By: Andreas Newport M.D.   On: 10/25/2015 01:18   Ct Chest W Contrast  10/25/2015  CLINICAL DATA:  [place].  Anti coagulated. EXAM: CT CHEST, ABDOMEN, AND PELVIS WITH CONTRAST TECHNIQUE: Multidetector CT imaging of the chest, abdomen and pelvis was performed following the standard protocol during bolus administration of intravenous contrast. CONTRAST:  100 mL Isovue-300 intravenous COMPARISON:  None. FINDINGS: CT CHEST The lungs are clear except for minor chronic appearing posterior base linear opacities which likely represent scarring. There is no pneumothorax. There is no effusion. The airways are patent and intact. No intrathoracic vascular injury. Mediastinum and hila appear unremarkable. No displaced fracture. CT ABDOMEN AND PELVIS There are normal intact appearances of the liver, spleen, pancreas, adrenals and kidneys. Mild diffuse fatty infiltration of the liver without focal lesion. Prior cholecystectomy. Bowel is unremarkable. There is  no peritoneal blood or free air. The abdominal aorta is normal in caliber and intact. Prior hysterectomy.  No adnexal abnormalities. No acute fracture is evident. There is a hematoma of the  subcutaneous tissues in the right flank region, measuring approximately 8 by 14 x 17 cm. There is a moderate volume vascular contrast extravasated into the hematoma, indicating active hemorrhage at the time of the scan. This hematoma is entirely superficial to the abdominal wall musculature. No significant intramuscular hemorrhage. There is no hemorrhage into the peritoneum or retroperitoneum. IMPRESSION: 1. Active hemorrhage into a large subcutaneous hematoma in the right flank region. The hematoma is confined to the subcutaneous tissues. No significant intramuscular component. No hemorrhage into the peritoneum or retroperitoneum. 2. No acute traumatic injury within the chest. No acute findings within the abdomen or pelvis. 3. Fatty liver. 4. These results were called by telephone at the time of interpretation on 10/25/2015 at 1:28 am to Dr. Everlene Balls , who verbally acknowledged these results. Electronically Signed   By: Andreas Newport M.D.   On: 10/25/2015 01:28   Ct Cervical Spine Wo Contrast  10/25/2015  CLINICAL DATA:  Fall on Coumadin. EXAM: CT HEAD WITHOUT CONTRAST CT CERVICAL SPINE WITHOUT CONTRAST TECHNIQUE: Multidetector CT imaging of the head and cervical spine was performed following the standard protocol without intravenous contrast. Multiplanar CT image reconstructions of the cervical spine were also generated. COMPARISON:  05/27/2015 FINDINGS: CT HEAD FINDINGS There is no intracranial hemorrhage, mass or evidence of acute infarction. There is moderate generalized atrophy. There is moderate chronic microvascular ischemic change. There is no significant extra-axial fluid collection. No acute intracranial findings are evident. The calvarium and skullbase are intact. The visible paranasal sinuses and orbits are unremarkable. CT CERVICAL SPINE FINDINGS The vertebral column, pedicles and facet articulations are intact. There is no evidence of acute fracture. No acute soft tissue abnormalities are  evident. Mild arthritic changes are present. No bone lesion or bony destruction. IMPRESSION: 1. No acute intracranial findings. There is moderate generalized atrophy and chronic appearing white matter hypodensities which likely represent small vessel ischemic disease. 2. Negative for acute cervical spine fracture Electronically Signed   By: Andreas Newport M.D.   On: 10/25/2015 01:18   Ct Abdomen Pelvis W Contrast  10/25/2015  CLINICAL DATA:  [place].  Anti coagulated. EXAM: CT CHEST, ABDOMEN, AND PELVIS WITH CONTRAST TECHNIQUE: Multidetector CT imaging of the chest, abdomen and pelvis was performed following the standard protocol during bolus administration of intravenous contrast. CONTRAST:  100 mL Isovue-300 intravenous COMPARISON:  None. FINDINGS: CT CHEST The lungs are clear except for minor chronic appearing posterior base linear opacities which likely represent scarring. There is no pneumothorax. There is no effusion. The airways are patent and intact. No intrathoracic vascular injury. Mediastinum and hila appear unremarkable. No displaced fracture. CT ABDOMEN AND PELVIS There are normal intact appearances of the liver, spleen, pancreas, adrenals and kidneys. Mild diffuse fatty infiltration of the liver without focal lesion. Prior cholecystectomy. Bowel is unremarkable. There is no peritoneal blood or free air. The abdominal aorta is normal in caliber and intact. Prior hysterectomy.  No adnexal abnormalities. No acute fracture is evident. There is a hematoma of the subcutaneous tissues in the right flank region, measuring approximately 8 by 14 x 17 cm. There is a moderate volume vascular contrast extravasated into the hematoma, indicating active hemorrhage at the time of the scan. This hematoma is entirely superficial to the abdominal wall musculature. No significant intramuscular hemorrhage. There is  no hemorrhage into the peritoneum or retroperitoneum. IMPRESSION: 1. Active hemorrhage into a large  subcutaneous hematoma in the right flank region. The hematoma is confined to the subcutaneous tissues. No significant intramuscular component. No hemorrhage into the peritoneum or retroperitoneum. 2. No acute traumatic injury within the chest. No acute findings within the abdomen or pelvis. 3. Fatty liver. 4. These results were called by telephone at the time of interpretation on 10/25/2015 at 1:28 am to Dr. Everlene Balls , who verbally acknowledged these results. Electronically Signed   By: Andreas Newport M.D.   On: 10/25/2015 01:28   Dg Pelvis Portable  10/25/2015  CLINICAL DATA:  [place] tonight.  Anti coagulated. EXAM: PORTABLE PELVIS 1-2 VIEWS COMPARISON:  None. FINDINGS: A single supine portable view of the pelvis is negative for displaced fracture. Pubic symphysis and sacroiliac joints appear intact. Hip joints appear intact. IMPRESSION: Negative. Electronically Signed   By: Andreas Newport M.D.   On: 10/25/2015 00:02   Dg Chest Port 1 View  10/25/2015  CLINICAL DATA:  Fall tonight. On Coumadin. Right pelvic hematoma. Shortness of breath. EXAM: PORTABLE CHEST 1 VIEW COMPARISON:  11/21/2012 FINDINGS: Low lung volumes are again demonstrated. Both lungs remain clear. No evidence of pneumothorax or pleural effusion. Cardiomegaly stable. AICD remains in appropriate position. IMPRESSION: Stable cardiomegaly and low lung volumes.  No acute findings. Electronically Signed   By: Earle Gell M.D.   On: 10/25/2015 00:03    Anti-infectives: Anti-infectives    None      Assessment/Plan: s/p  Advance diet Keep at bedrest  Keep in ICU until hemoglobin stable. Platelets are dropping and there is some concern that she is still bleeding.  LOS: 1 day   Kathryne Eriksson. Dahlia Bailiff, MD, FACS [phone] Trauma Surgeon 10/26/2015

## 2015-10-26 NOTE — Progress Notes (Signed)
.     Advanced Heart Failure Rounding Note  Referring Physician: Dr Hulen Skains Primary Physician: Maurice Small, MD Primary Cardiologist: Dr Marlou Porch  Reason for Consultation: Numerous blood products in setting of EF 35%  Subjective:    Had more bleeding overnight. Denies SOB.   No weight yet today.   I/O overall positive 1.8 L.   Objective:   Weight Range: 249 lb 5.4 oz (113.1 kg) Body mass index is 40.26 kg/(m^2).   Vital Signs:   Temp:  [97.4 F (36.3 C)-99.5 F (37.5 C)] 98.1 F (36.7 C) (05/10 0800) Pulse Rate:  [79-102] 96 (05/10 1100) Resp:  [11-24] 21 (05/10 1100) BP: (82-124)/(47-70) 99/47 mmHg (05/10 1100) SpO2:  [90 %-100 %] 94 % (05/10 1100) Last BM Date: 10/24/15  Weight change: Filed Weights   10/25/15 0700  Weight: 249 lb 5.4 oz (113.1 kg)    Intake/Output:   Intake/Output Summary (Last 24 hours) at 10/26/15 1156 Last data filed at 10/26/15 1000  Gross per 24 hour  Intake   2052 ml  Output   2025 ml  Net     27 ml     Physical Exam: General: Lying in bed No resp difficulty HEENT: normal Neck: supple. JVP jaw Carotids 2+ bilat; no bruits. No thyromegaly or nodule noted.  Cor: PMI nondisplaced. Regular rate & rhythm. No rubs, gallops or murmurs. Lungs: CTAB, normal effort Abdomen: obese soft, nondistended. Large flank hematoma, Mildly tender to and around same. Abdominal binder in place. No hepatosplenomegaly. No bruits or masses. Good bowel sounds. Extremities: no cyanosis, clubbing, rash, Trace - 1+ edema Neuro: alert & orientedx3, cranial nerves grossly intact. moves all 4 extremities w/o difficulty. Affect pleasant  Telemetry: NSR  Labs: CBC  Recent Labs  10/26/15 0112 10/26/15 0655  WBC 12.5* 11.5*  HGB 6.5* 7.4*  HCT 20.0* 23.2*  MCV 90.9 87.2  PLT 168 [id number]*   Basic Metabolic Panel  Recent Labs  10/25/15 0050 10/25/15 0103 10/26/15 0655  NA 138 140 140  K 4.4 4.3 4.5  CL 98* 98* 100*  CO2 28  --  29  GLUCOSE 193* 178* 126*   BUN 26* 27* 28*  CREATININE 1.20* 1.10* 1.35*  CALCIUM 9.4  --  8.6*   Liver Function Tests  Recent Labs  10/25/15 0050 10/26/15 0655  AST 20 19  ALT 13* 13*  ALKPHOS 42 46  BILITOT 0.6 0.9  PROT 6.2* 5.7*  ALBUMIN 3.5 3.2*   No results for input(s): LIPASE, AMYLASE in the last 72 hours. Cardiac Enzymes No results for input(s): CKTOTAL, CKMB, CKMBINDEX, TROPONINI in the last 72 hours.  BNP: BNP (last 3 results) No results for input(s): BNP in the last 8760 hours.  ProBNP (last 3 results) No results for input(s): PROBNP in the last 8760 hours.   D-Dimer No results for input(s): DDIMER in the last 72 hours. Hemoglobin A1C No results for input(s): HGBA1C in the last 72 hours. Fasting Lipid Panel No results for input(s): CHOL, HDL, LDLCALC, TRIG, CHOLHDL, LDLDIRECT in the last 72 hours. Thyroid Function Tests No results for input(s): TSH, T4TOTAL, T3FREE, THYROIDAB in the last 72 hours.  Invalid input(s): FREET3  Other results:     Imaging/Studies:  Ct Head Wo Contrast  10/25/2015  CLINICAL DATA:  Fall on Coumadin. EXAM: CT HEAD WITHOUT CONTRAST CT CERVICAL SPINE WITHOUT CONTRAST TECHNIQUE: Multidetector CT imaging of the head and cervical spine was performed following the standard protocol without intravenous contrast. Multiplanar CT image reconstructions of  the cervical spine were also generated. COMPARISON:  05/27/2015 FINDINGS: CT HEAD FINDINGS There is no intracranial hemorrhage, mass or evidence of acute infarction. There is moderate generalized atrophy. There is moderate chronic microvascular ischemic change. There is no significant extra-axial fluid collection. No acute intracranial findings are evident. The calvarium and skullbase are intact. The visible paranasal sinuses and orbits are unremarkable. CT CERVICAL SPINE FINDINGS The vertebral column, pedicles and facet articulations are intact. There is no evidence of acute fracture. No acute soft tissue  abnormalities are evident. Mild arthritic changes are present. No bone lesion or bony destruction. IMPRESSION: 1. No acute intracranial findings. There is moderate generalized atrophy and chronic appearing white matter hypodensities which likely represent small vessel ischemic disease. 2. Negative for acute cervical spine fracture Electronically Signed   By: Andreas Newport M.D.   On: 10/25/2015 01:18   Ct Chest W Contrast  10/25/2015  CLINICAL DATA:  [place].  Anti coagulated. EXAM: CT CHEST, ABDOMEN, AND PELVIS WITH CONTRAST TECHNIQUE: Multidetector CT imaging of the chest, abdomen and pelvis was performed following the standard protocol during bolus administration of intravenous contrast. CONTRAST:  100 mL Isovue-300 intravenous COMPARISON:  None. FINDINGS: CT CHEST The lungs are clear except for minor chronic appearing posterior base linear opacities which likely represent scarring. There is no pneumothorax. There is no effusion. The airways are patent and intact. No intrathoracic vascular injury. Mediastinum and hila appear unremarkable. No displaced fracture. CT ABDOMEN AND PELVIS There are normal intact appearances of the liver, spleen, pancreas, adrenals and kidneys. Mild diffuse fatty infiltration of the liver without focal lesion. Prior cholecystectomy. Bowel is unremarkable. There is no peritoneal blood or free air. The abdominal aorta is normal in caliber and intact. Prior hysterectomy.  No adnexal abnormalities. No acute fracture is evident. There is a hematoma of the subcutaneous tissues in the right flank region, measuring approximately 8 by 14 x 17 cm. There is a moderate volume vascular contrast extravasated into the hematoma, indicating active hemorrhage at the time of the scan. This hematoma is entirely superficial to the abdominal wall musculature. No significant intramuscular hemorrhage. There is no hemorrhage into the peritoneum or retroperitoneum. IMPRESSION: 1. Active hemorrhage into a large  subcutaneous hematoma in the right flank region. The hematoma is confined to the subcutaneous tissues. No significant intramuscular component. No hemorrhage into the peritoneum or retroperitoneum. 2. No acute traumatic injury within the chest. No acute findings within the abdomen or pelvis. 3. Fatty liver. 4. These results were called by telephone at the time of interpretation on 10/25/2015 at 1:28 am to Dr. Everlene Balls , who verbally acknowledged these results. Electronically Signed   By: Andreas Newport M.D.   On: 10/25/2015 01:28   Ct Cervical Spine Wo Contrast  10/25/2015  CLINICAL DATA:  Fall on Coumadin. EXAM: CT HEAD WITHOUT CONTRAST CT CERVICAL SPINE WITHOUT CONTRAST TECHNIQUE: Multidetector CT imaging of the head and cervical spine was performed following the standard protocol without intravenous contrast. Multiplanar CT image reconstructions of the cervical spine were also generated. COMPARISON:  05/27/2015 FINDINGS: CT HEAD FINDINGS There is no intracranial hemorrhage, mass or evidence of acute infarction. There is moderate generalized atrophy. There is moderate chronic microvascular ischemic change. There is no significant extra-axial fluid collection. No acute intracranial findings are evident. The calvarium and skullbase are intact. The visible paranasal sinuses and orbits are unremarkable. CT CERVICAL SPINE FINDINGS The vertebral column, pedicles and facet articulations are intact. There is no evidence of acute fracture. No  acute soft tissue abnormalities are evident. Mild arthritic changes are present. No bone lesion or bony destruction. IMPRESSION: 1. No acute intracranial findings. There is moderate generalized atrophy and chronic appearing white matter hypodensities which likely represent small vessel ischemic disease. 2. Negative for acute cervical spine fracture Electronically Signed   By: Andreas Newport M.D.   On: 10/25/2015 01:18   Ct Abdomen Pelvis W Contrast  10/25/2015  CLINICAL  DATA:  [place].  Anti coagulated. EXAM: CT CHEST, ABDOMEN, AND PELVIS WITH CONTRAST TECHNIQUE: Multidetector CT imaging of the chest, abdomen and pelvis was performed following the standard protocol during bolus administration of intravenous contrast. CONTRAST:  100 mL Isovue-300 intravenous COMPARISON:  None. FINDINGS: CT CHEST The lungs are clear except for minor chronic appearing posterior base linear opacities which likely represent scarring. There is no pneumothorax. There is no effusion. The airways are patent and intact. No intrathoracic vascular injury. Mediastinum and hila appear unremarkable. No displaced fracture. CT ABDOMEN AND PELVIS There are normal intact appearances of the liver, spleen, pancreas, adrenals and kidneys. Mild diffuse fatty infiltration of the liver without focal lesion. Prior cholecystectomy. Bowel is unremarkable. There is no peritoneal blood or free air. The abdominal aorta is normal in caliber and intact. Prior hysterectomy.  No adnexal abnormalities. No acute fracture is evident. There is a hematoma of the subcutaneous tissues in the right flank region, measuring approximately 8 by 14 x 17 cm. There is a moderate volume vascular contrast extravasated into the hematoma, indicating active hemorrhage at the time of the scan. This hematoma is entirely superficial to the abdominal wall musculature. No significant intramuscular hemorrhage. There is no hemorrhage into the peritoneum or retroperitoneum. IMPRESSION: 1. Active hemorrhage into a large subcutaneous hematoma in the right flank region. The hematoma is confined to the subcutaneous tissues. No significant intramuscular component. No hemorrhage into the peritoneum or retroperitoneum. 2. No acute traumatic injury within the chest. No acute findings within the abdomen or pelvis. 3. Fatty liver. 4. These results were called by telephone at the time of interpretation on 10/25/2015 at 1:28 am to Dr. Everlene Balls , who verbally acknowledged  these results. Electronically Signed   By: Andreas Newport M.D.   On: 10/25/2015 01:28   Dg Pelvis Portable  10/25/2015  CLINICAL DATA:  [place] tonight.  Anti coagulated. EXAM: PORTABLE PELVIS 1-2 VIEWS COMPARISON:  None. FINDINGS: A single supine portable view of the pelvis is negative for displaced fracture. Pubic symphysis and sacroiliac joints appear intact. Hip joints appear intact. IMPRESSION: Negative. Electronically Signed   By: Andreas Newport M.D.   On: 10/25/2015 00:02   Dg Chest Port 1 View  10/25/2015  CLINICAL DATA:  Fall tonight. On Coumadin. Right pelvic hematoma. Shortness of breath. EXAM: PORTABLE CHEST 1 VIEW COMPARISON:  11/21/2012 FINDINGS: Low lung volumes are again demonstrated. Both lungs remain clear. No evidence of pneumothorax or pleural effusion. Cardiomegaly stable. AICD remains in appropriate position. IMPRESSION: Stable cardiomegaly and low lung volumes.  No acute findings. Electronically Signed   By: Earle Gell M.D.   On: 10/25/2015 00:03     Latest Echo  Latest Cath   Medications:     Scheduled Medications: . allopurinol  400 mg Oral Daily  . cholecalciferol  1,000 Units Oral Daily  . colestipol  2 g Oral Daily  . docusate sodium  100 mg Oral BID  . gabapentin  800 mg Oral TID  . gemfibrozil  600 mg Oral BID AC  . multivitamin with  minerals  1 tablet Oral Daily  . nortriptyline  25 mg Oral QHS  . ondansetron (ZOFRAN) IV  4 mg Intravenous Once  . pravastatin  80 mg Oral QPM     Infusions: . sodium chloride 10 mL/hr at 10/26/15 1000     PRN Medications:  acetaminophen, morphine injection, nitroGLYCERIN, ondansetron **OR** ondansetron (ZOFRAN) IV, oxyCODONE   Assessment/Plan   1. Fall with large R flank hematoma 2. NICM EF 35% s/p BiV ICD - [employer]. -> EF 50-55% on echo 5/10 3. AF  4. Morbid obesity with h/o CO2 retention  5. Acute blood loss anemia   Had more bleeding overnight with further transfusion as well. Has had total of 3  UPRBCs and 4 UFFP  She remains volume overloaded today.  Will give addition 40 mg IV lasix today. Check daily weights.   Creatinine up slightly from yesterday, continue to follow with BMETs.   Length of Stay: 1   Shirley Friar PA-C 10/26/2015, 11:56 AM  Advanced Heart Failure Team Pager [phone] (M-F; 7a - 4p)  Please contact [facility] for night-coverage after hours (4p -7a ) and weekends on [email or web address]   Patient seen and examined with Oda Kilts, PA-C. We discussed all aspects of the encounter. I agree with the assessment and plan as stated above.   Continues to receive blood products for dropping hgb. Volume status is up today. Sats down to 94%.  Will give another 40 lasix. Echo shows improved EF 50-55%. Continue to hold anticoagulants. Maintain SCDs  Bensimhon, Daniel,MD 2:11 PM

## 2015-10-27 LAB — CBC WITH DIFFERENTIAL/PLATELET
BASOS ABS: 0 10*3/uL (ref 0.0–0.1)
Basophils Relative: 0 %
EOS ABS: 0.4 10*3/uL (ref 0.0–0.7)
EOS PCT: 4 %
HCT: 22.8 % — ABNORMAL LOW (ref 36.0–46.0)
HEMOGLOBIN: 7.3 g/dL — AB (ref 12.0–15.0)
LYMPHS ABS: 2.6 10*3/uL (ref 0.7–4.0)
LYMPHS PCT: 22 %
MCH: 28.6 pg (ref 26.0–34.0)
MCHC: 32 g/dL (ref 30.0–36.0)
MCV: 89.4 fL (ref 78.0–100.0)
Monocytes Absolute: 1.4 10*3/uL — ABNORMAL HIGH (ref 0.1–1.0)
Monocytes Relative: 12 %
NEUTROS PCT: 62 %
Neutro Abs: 7.1 10*3/uL (ref 1.7–7.7)
PLATELETS: 166 10*3/uL (ref 150–400)
RBC: 2.55 MIL/uL — AB (ref 3.87–5.11)
RDW: 20.8 % — ABNORMAL HIGH (ref 11.5–15.5)
WBC: 11.5 10*3/uL — AB (ref 4.0–10.5)

## 2015-10-27 LAB — CBC
HCT: 22.8 % — ABNORMAL LOW (ref 36.0–46.0)
HEMOGLOBIN: 7.1 g/dL — AB (ref 12.0–15.0)
MCH: 28 pg (ref 26.0–34.0)
MCHC: 31.1 g/dL (ref 30.0–36.0)
MCV: 89.8 fL (ref 78.0–100.0)
PLATELETS: 187 10*3/uL (ref 150–400)
RBC: 2.54 MIL/uL — AB (ref 3.87–5.11)
RDW: 20.2 % — ABNORMAL HIGH (ref 11.5–15.5)
WBC: 10.6 10*3/uL — AB (ref 4.0–10.5)

## 2015-10-27 LAB — BASIC METABOLIC PANEL
ANION GAP: 9 (ref 5–15)
BUN: 23 mg/dL — ABNORMAL HIGH (ref 6–20)
CHLORIDE: 99 mmol/L — AB (ref 101–111)
CO2: 31 mmol/L (ref 22–32)
Calcium: 8.7 mg/dL — ABNORMAL LOW (ref 8.9–10.3)
Creatinine, Ser: 1.13 mg/dL — ABNORMAL HIGH (ref 0.44–1.00)
GFR calc Af Amer: 52 mL/min — ABNORMAL LOW (ref >60)
GFR calc non Af Amer: 45 mL/min — ABNORMAL LOW (ref >60)
Glucose, Bld: 131 mg/dL — ABNORMAL HIGH (ref 65–99)
POTASSIUM: 3.8 mmol/L (ref 3.5–5.1)
SODIUM: 139 mmol/L (ref 135–145)

## 2015-10-27 LAB — TYPE AND SCREEN
ABO/RH(D): A POS
ANTIBODY SCREEN: NEGATIVE
UNIT DIVISION: 0
Unit division: 0
Unit division: 0

## 2015-10-27 MED ORDER — SODIUM CHLORIDE 0.9 % IV SOLN: INTRAVENOUS | Status: DC | PRN

## 2015-10-27 MED ORDER — FUROSEMIDE 40 MG PO TABS
40.0000 mg | ORAL_TABLET | Freq: Once | ORAL | Status: AC
  Administered 2015-10-27: 40 mg via ORAL
  Filled 2015-10-27: qty 1

## 2015-10-27 MED ORDER — POTASSIUM CHLORIDE CRYS ER 20 MEQ PO TBCR
40.0000 meq | EXTENDED_RELEASE_TABLET | Freq: Once | ORAL | Status: AC
  Administered 2015-10-27: 40 meq via ORAL
  Filled 2015-10-27: qty 2

## 2015-10-27 MED ORDER — FUROSEMIDE 20 MG PO TABS
20.0000 mg | ORAL_TABLET | Freq: Every day | ORAL | Status: DC
  Administered 2015-10-28 – 2015-11-02 (×6): 20 mg via ORAL
  Filled 2015-10-27 (×6): qty 1

## 2015-10-27 MED ORDER — POLYETHYLENE GLYCOL 3350 17 G PO PACK
17.0000 g | PACK | Freq: Every day | ORAL | Status: DC | PRN
  Administered 2015-10-27 – 2015-10-29 (×3): 17 g via ORAL
  Filled 2015-10-27 (×3): qty 1

## 2015-10-27 NOTE — Progress Notes (Signed)
Patient ID: Bianca [name], female   DOB: [birth year], 82 y.o.   MRN: [id number]    Subjective: Some pressure in abdomen but no pain. Tolerated diet.   Objective: Vital signs in last 24 hours: Temp:  [98.5 F (36.9 C)-99.2 F (37.3 C)] 98.5 F (36.9 C) (05/11 0700) Pulse Rate:  [86-110] 110 (05/11 0900) Resp:  [13-22] 15 (05/11 0900) BP: (99-144)/(47-129) 136/100 mmHg (05/11 0900) SpO2:  [91 %-97 %] 94 % (05/11 0900) Weight:  [114.9 kg (253 lb 4.9 oz)] 114.9 kg (253 lb 4.9 oz) (05/11 0500) Last BM Date: 10/24/15  Intake/Output from previous day: 05/10 0701 - 05/11 0700 In: 296 [I.V.:296] Out: 4500 [Urine:4500] Intake/Output this shift:    General appearance: alert and cooperative Resp: minimal rales Cardio: regular rate and rhythm GI: soft, R flank fullness Extremities: calves soft  Lab Results: CBC   Recent Labs  10/26/15 1626 10/27/15 0325  WBC 11.7* 11.5*  HGB 7.5* 7.3*  HCT 22.6* 22.8*  PLT 145* 166   BMET  Recent Labs  10/26/15 0655 10/27/15 0325  NA 140 139  K 4.5 3.8  CL 100* 99*  CO2 29 31  GLUCOSE 126* 131*  BUN 28* 23*  CREATININE 1.35* 1.13*  CALCIUM 8.6* 8.7*   PT/INR  Recent Labs  10/25/15 1238 10/26/15 0655  LABPROT 16.2* 15.6*  INR 1.29 1.22   ABG No results for input(s): PHART, HCO3 in the last 72 hours.  Invalid input(s): PCO2, PO2  Studies/Results: No results found.  Anti-infectives: Anti-infectives    None      Assessment/Plan: Fall Anticoagulated - coumadin on hold R flank hematoma - Hb stabilized and PLTs up a bit. Bedrest today. UOB tomorrow if no furhter bleeding, continue binder ABL anemia - see above, CBC at 1800 then in AM CHF - appreciate cardiology F/U VTE - PAS FEN - diet Dispo - ICU I spoke with her daughters as well    LOS: 2 days    Georganna Skeans, MD, MPH, FACS Trauma: [phone] General Surgery: [phone]  10/27/2015

## 2015-10-27 NOTE — Progress Notes (Signed)
.     Advanced Heart Failure Rounding Note  Referring Physician: Dr Hulen Skains Primary Physician: Maurice Small, MD Primary Cardiologist: Dr Marlou Porch  Reason for Consultation: Numerous blood products in setting of EF 35%  Subjective:    No further bleeding. Hgb stabilized and PLTs improved. May be OOB tomorrow. Feeling OK. Denies SOB or CP.   Out 3.2 L yesterday with IV lasix. Creatinine stable to improved. Weight shows up 5 lbs from admission.   Objective:   Weight Range: 253 lb 4.9 oz (114.9 kg) Body mass index is 40.9 kg/(m^2).   Vital Signs:   Temp:  [98.5 F (36.9 C)-99.2 F (37.3 C)] 98.5 F (36.9 C) (05/11 0700) Pulse Rate:  [86-110] 106 (05/11 1000) Resp:  [13-22] 19 (05/11 1100) BP: (103-144)/(52-129) 140/77 mmHg (05/11 1100) SpO2:  [91 %-98 %] 96 % (05/11 1100) Weight:  [253 lb 4.9 oz (114.9 kg)] 253 lb 4.9 oz (114.9 kg) (05/11 0500) Last BM Date: 10/24/15  Weight change: Filed Weights   10/25/15 0700 10/27/15 0500  Weight: 249 lb 5.4 oz (113.1 kg) 253 lb 4.9 oz (114.9 kg)    Intake/Output:   Intake/Output Summary (Last 24 hours) at 10/27/15 1123 Last data filed at 10/27/15 0900  Gross per 24 hour  Intake    550 ml  Output   3700 ml  Net  -3150 ml     Physical Exam: General: Lying in bed No resp difficulty HEENT: normal Neck: supple. JVP 7-8. Carotids 2+ bilat; no bruits. No thyromegaly or nodule noted.  Cor: PMI nondisplaced. Regular rate & rhythm. No rubs, gallops or murmurs. Lungs: Clear Abdomen: obese soft, nondistended. Large flank hematoma, Mildly tender to and around same. Abdominal binder in place. No hepatosplenomegaly. No bruits or masses. Good bowel sounds. Extremities: no cyanosis, clubbing, rash, Trace - 1+ edema Neuro: alert & orientedx3, cranial nerves grossly intact. moves all 4 extremities w/o difficulty. Affect pleasant  Telemetry: Reviewed, NSR  Labs: CBC  Recent Labs  10/26/15 1626 10/27/15 0325  WBC 11.7* 11.5*  NEUTROABS  7.9* 7.1  HGB 7.5* 7.3*  HCT 22.6* 22.8*  MCV 89.0 89.4  PLT 145* [id number]   Basic Metabolic Panel  Recent Labs  10/26/15 0655 10/27/15 0325  NA 140 139  K 4.5 3.8  CL 100* 99*  CO2 29 31  GLUCOSE 126* 131*  BUN 28* 23*  CREATININE 1.35* 1.13*  CALCIUM 8.6* 8.7*   Liver Function Tests  Recent Labs  10/25/15 0050 10/26/15 0655  AST 20 19  ALT 13* 13*  ALKPHOS 42 46  BILITOT 0.6 0.9  PROT 6.2* 5.7*  ALBUMIN 3.5 3.2*   No results for input(s): LIPASE, AMYLASE in the last 72 hours. Cardiac Enzymes No results for input(s): CKTOTAL, CKMB, CKMBINDEX, TROPONINI in the last 72 hours.  BNP: BNP (last 3 results) No results for input(s): BNP in the last 8760 hours.  ProBNP (last 3 results) No results for input(s): PROBNP in the last 8760 hours.   D-Dimer No results for input(s): DDIMER in the last 72 hours. Hemoglobin A1C No results for input(s): HGBA1C in the last 72 hours. Fasting Lipid Panel No results for input(s): CHOL, HDL, LDLCALC, TRIG, CHOLHDL, LDLDIRECT in the last 72 hours. Thyroid Function Tests No results for input(s): TSH, T4TOTAL, T3FREE, THYROIDAB in the last 72 hours.  Invalid input(s): FREET3  Other results:     Imaging/Studies:  No results found.  Latest Echo  Latest Cath   Medications:     Scheduled  Medications: . allopurinol  400 mg Oral Daily  . cholecalciferol  1,000 Units Oral Daily  . colestipol  2 g Oral Daily  . docusate sodium  100 mg Oral BID  . gabapentin  800 mg Oral TID  . gemfibrozil  600 mg Oral BID AC  . multivitamin with minerals  1 tablet Oral Daily  . nortriptyline  25 mg Oral QHS  . pravastatin  80 mg Oral QPM    Infusions: . sodium chloride      PRN Medications: sodium chloride, acetaminophen, morphine injection, nitroGLYCERIN, ondansetron **OR** ondansetron (ZOFRAN) IV, oxyCODONE, polyethylene glycol   Assessment   1. Fall with large R flank hematoma 2. NICM EF 35% s/p BiV ICD - [employer]onic. -> EF  50-55% on echo 5/10 3. AF  4. Morbid obesity with h/o CO2 retention  5. Acute blood loss anemia  Plan    Improving. Hgb stable.  May be OOB tomorrow if continues to have no further bleeding.   Her volume status appears to have improved.  Will resume home lasix at 20 mg daily.   Can repeat 40 mg IV as needed.   Creatinine stable to improved. Continue to follow daily.   Length of Stay: 2   Shirley Friar PA-C 10/27/2015, 11:23 AM  Advanced Heart Failure Team Pager [phone] (M-F; 7a - 4p)  Please contact [facility] for night-coverage after hours (4p -7a ) and weekends on [email or web address]  Patient seen and examined with Oda Kilts, PA-C. We discussed all aspects of the encounter. I agree with the assessment and plan as stated above.   Excellent diuresis yesterday. Now euvolemic. Hgb stabilizing. EF normalized on echo. Can switch to po lasix. Continue SCDs. Can likely start DVT prophylaxis soon.   Khalid Lacko,MD 2:29 PM

## 2015-10-28 LAB — BASIC METABOLIC PANEL
Anion gap: 11 (ref 5–15)
BUN: 17 mg/dL (ref 6–20)
CALCIUM: 8.9 mg/dL (ref 8.9–10.3)
CHLORIDE: 97 mmol/L — AB (ref 101–111)
CO2: 34 mmol/L — ABNORMAL HIGH (ref 22–32)
CREATININE: 1.1 mg/dL — AB (ref 0.44–1.00)
GFR calc Af Amer: 53 mL/min — ABNORMAL LOW (ref >60)
GFR, EST NON AFRICAN AMERICAN: 46 mL/min — AB (ref >60)
Glucose, Bld: 131 mg/dL — ABNORMAL HIGH (ref 65–99)
Potassium: 3.9 mmol/L (ref 3.5–5.1)
SODIUM: 142 mmol/L (ref 135–145)

## 2015-10-28 LAB — CBC
HCT: 23.8 % — ABNORMAL LOW (ref 36.0–46.0)
HEMOGLOBIN: 7.3 g/dL — AB (ref 12.0–15.0)
MCH: 27.8 pg (ref 26.0–34.0)
MCHC: 30.7 g/dL (ref 30.0–36.0)
MCV: 90.5 fL (ref 78.0–100.0)
PLATELETS: 201 10*3/uL (ref 150–400)
RBC: 2.63 MIL/uL — AB (ref 3.87–5.11)
RDW: 20 % — ABNORMAL HIGH (ref 11.5–15.5)
WBC: 10.4 10*3/uL (ref 4.0–10.5)

## 2015-10-28 MED ORDER — LEVOFLOXACIN 500 MG PO TABS
500.0000 mg | ORAL_TABLET | Freq: Every day | ORAL | Status: DC
  Administered 2015-10-28 – 2015-10-30 (×3): 500 mg via ORAL
  Filled 2015-10-28 (×3): qty 1

## 2015-10-28 MED ORDER — FERROUS GLUCONATE 324 (38 FE) MG PO TABS
324.0000 mg | ORAL_TABLET | Freq: Two times a day (BID) | ORAL | Status: DC
  Administered 2015-10-28 – 2015-11-03 (×13): 324 mg via ORAL
  Filled 2015-10-28 (×15): qty 1

## 2015-10-28 NOTE — Progress Notes (Signed)
.     Advanced Heart Failure Rounding Note  Referring Physician: Dr Hulen Skains Primary Physician: Maurice Small, MD Primary Cardiologist: Dr Marlou Porch  Reason for Consultation: Numerous blood products in setting of EF 35%  Subjective:    No further bleeding. No complaints this am. Glad to be out of bed and in chair.    Creatinine stable on po lasix.   Objective:   Weight Range: 253 lb 4.9 oz (114.9 kg) Body mass index is 40.9 kg/(m^2).   Vital Signs:   Temp:  [97.6 F (36.4 C)-99.7 F (37.6 C)] 98.1 F (36.7 C) (05/12 1200) Pulse Rate:  [86-101] 86 (05/12 1200) Resp:  [10-23] 21 (05/12 1200) BP: (82-134)/(53-80) 108/53 mmHg (05/12 1200) SpO2:  [93 %-96 %] 95 % (05/12 1200) Last BM Date: 10/24/15 (prior to admission)  Weight change: Filed Weights   10/25/15 0700 10/27/15 0500  Weight: 249 lb 5.4 oz (113.1 kg) 253 lb 4.9 oz (114.9 kg)    Intake/Output:   Intake/Output Summary (Last 24 hours) at 10/28/15 1330 Last data filed at 10/28/15 1200  Gross per 24 hour  Intake   1320 ml  Output   1625 ml  Net   -305 ml     Physical Exam: General: Lying in bed No resp difficulty HEENT: normal Neck: supple. JVP 7-8. Carotids 2+ bilat; no bruits. No thyromegaly or lymphadenopathy noted.  Cor: PMI nondisplaced. RRR, no M/G/R Lungs: Clear Abdomen: obese soft, nondistended. Large flank hematoma, Abdominal binder in place. No HSM. No bruits or masses. Good bowel sounds. Extremities: no cyanosis, clubbing, rash, Trace - 1+ edema Neuro: alert & orientedx3, cranial nerves grossly intact. moves all 4 extremities w/o difficulty. Affect pleasant  Telemetry: Reviewed, NSR  Labs: CBC  Recent Labs  10/26/15 1626 10/27/15 0325 10/27/15 1907 10/28/15 0328  WBC 11.7* 11.5* 10.6* 10.4  NEUTROABS 7.9* 7.1  --   --   HGB 7.5* 7.3* 7.1* 7.3*  HCT 22.6* 22.8* 22.8* 23.8*  MCV 89.0 89.4 89.8 90.5  PLT 145* 166 187 [id number]   Basic Metabolic Panel  Recent Labs  10/27/15 0325  10/28/15 0328  NA 139 142  K 3.8 3.9  CL 99* 97*  CO2 31 34*  GLUCOSE 131* 131*  BUN 23* 17  CREATININE 1.13* 1.10*  CALCIUM 8.7* 8.9   Liver Function Tests  Recent Labs  10/26/15 0655  AST 19  ALT 13*  ALKPHOS 46  BILITOT 0.9  PROT 5.7*  ALBUMIN 3.2*   No results for input(s): LIPASE, AMYLASE in the last 72 hours. Cardiac Enzymes No results for input(s): CKTOTAL, CKMB, CKMBINDEX, TROPONINI in the last 72 hours.  BNP: BNP (last 3 results) No results for input(s): BNP in the last 8760 hours.  ProBNP (last 3 results) No results for input(s): PROBNP in the last 8760 hours.   D-Dimer No results for input(s): DDIMER in the last 72 hours. Hemoglobin A1C No results for input(s): HGBA1C in the last 72 hours. Fasting Lipid Panel No results for input(s): CHOL, HDL, LDLCALC, TRIG, CHOLHDL, LDLDIRECT in the last 72 hours. Thyroid Function Tests No results for input(s): TSH, T4TOTAL, T3FREE, THYROIDAB in the last 72 hours.  Invalid input(s): FREET3  Other results:     Imaging/Studies:  No results found.  Latest Echo  Latest Cath   Medications:     Scheduled Medications: . allopurinol  400 mg Oral Daily  . cholecalciferol  1,000 Units Oral Daily  . colestipol  2 g Oral Daily  . docusate  sodium  100 mg Oral BID  . ferrous gluconate  324 mg Oral BID WC  . furosemide  20 mg Oral Daily  . gabapentin  800 mg Oral TID  . gemfibrozil  600 mg Oral BID AC  . levofloxacin  500 mg Oral Daily  . multivitamin with minerals  1 tablet Oral Daily  . nortriptyline  25 mg Oral QHS  . pravastatin  80 mg Oral QPM    Infusions: . sodium chloride      PRN Medications: sodium chloride, acetaminophen, nitroGLYCERIN, ondansetron **OR** ondansetron (ZOFRAN) IV, oxyCODONE, polyethylene glycol   Assessment   1. Fall with large R flank hematoma 2. NICM EF 35% s/p BiV ICD - [employer]onic. -> EF 50-55% on echo 5/10 3. AF  4. Morbid obesity with h/o CO2 retention  5. Acute  blood loss anemia  Plan    Hgb stable.  Now UOB.   Her volume status seems stable on exam. Continue home lasix at 20 mg daily.   Can repeat 40 mg IV as needed.   Creatinine stable to improved. Continue to follow daily.   Length of Stay: 3   Shirley Friar PA-C 10/28/2015, 1:30 PM  Advanced Heart Failure Team Pager [phone] (M-F; 7a - 4p)  Please contact [facility] for night-coverage after hours (4p -7a ) and weekends on [email or web address]   Patient seen and examined with Oda Kilts, PA-C. We discussed all aspects of the encounter. I agree with the assessment and plan as stated above.   Volume status stable. Back on home diuretics. Will follow at a distance.   Consider enoxaparin for DVT prophylaxis. Will need to consider if anti-coagulation should be resumed at some point. Can decide as outpatient.   Bensimhon, Daniel,MD 3:32 PM

## 2015-10-28 NOTE — Evaluation (Signed)
Physical Therapy Evaluation Patient Details Name: Bianca [name] MRN: [id number] DOB: 01/27/1933 Today's Date: 10/28/2015   History of Present Illness  Pt admitted on 10/23/15 s/p fall with large Rt flank hematoma  Clinical Impression  Patient demonstrates deficits in functional mobility as indicated below. Will need continued skilled PT to address deficits and maximize function. Will see as indicated and progress as tolerated. At this time, patient requiring increased physical assist for all aspects of mobility (2 person). Will need ST SNF upon acute discharge.    Follow Up Recommendations SNF;Supervision/Assistance - 24 hour    Equipment Recommendations  Rolling walker with 5" wheels    Recommendations for Other Services       Precautions / Restrictions Precautions Precautions: Fall Precaution Comments: watch O2 saturations Restrictions Weight Bearing Restrictions: No      Mobility  Bed Mobility Overal bed mobility: Needs Assistance;+2 for physical assistance Bed Mobility: Rolling;Supine to Sit Rolling: Min assist   Supine to sit: Mod assist;+2 for physical assistance     General bed mobility comments: assist for positioning, elevation of trunk and rotation of hips to EOB  Transfers Overall transfer level: Needs assistance Equipment used: Rolling walker (2 wheeled) Transfers: Sit to/from [facility] Sit to Stand: Mod assist;+2 physical assistance Stand pivot transfers: Mod assist;+2 physical assistance (to [facility])       General transfer comment: Moderate assist for stability, significant posterior list, assist to power up to standing  Ambulation/Gait Ambulation/Gait assistance: Mod assist;+2 physical assistance Ambulation Distance (Feet): 8 Feet Assistive device: Rolling walker (2 wheeled) Gait Pattern/deviations: Step-to pattern;Decreased stride length;Shuffle;Leaning posteriorly;Trunk flexed;Wide base of support Gait velocity: decreased Gait  velocity interpretation: <1.8 ft/sec, indicative of risk for recurrent falls General Gait Details: decreased stability noted, + dizziness with standing, resolved with sitting (desaturation on room air to 88%)  Stairs            Wheelchair Mobility    Modified Rankin (Stroke Patients Only)       Balance                                             Pertinent Vitals/Pain Pain Assessment: Faces Faces Pain Scale: Hurts little more Pain Location: right side Pain Descriptors / Indicators: Discomfort;Grimacing;Guarding Pain Intervention(s): Limited activity within patient's tolerance;Monitored during session;Repositioned    Home Living Family/patient expects to be discharged to:: Skilled nursing facility Living Arrangements: Children                    Prior Function Level of Independence: Independent               Hand Dominance   Dominant Hand: Right    Extremity/Trunk Assessment   Upper Extremity Assessment: Defer to OT evaluation           Lower Extremity Assessment: Generalized weakness;RLE deficits/detail (increased body habitus noted)      Cervical / Trunk Assessment:  (right trunk hematoma noted)  Communication   Communication: HOH  Cognition Arousal/Alertness: Awake/alert Behavior During Therapy: WFL for tasks assessed/performed Overall Cognitive Status: Within Functional Limits for tasks assessed                      General Comments      Exercises        Assessment/Plan    PT Assessment Patient needs continued PT  services  PT Diagnosis Difficulty walking;Abnormality of gait;Generalized weakness;Acute pain   PT Problem List Decreased strength;Decreased activity tolerance;Decreased balance;Decreased mobility;Decreased coordination;Decreased cognition;Decreased safety awareness;Obesity;Pain  PT Treatment Interventions DME instruction;Gait training;Stair training;Functional mobility training;Therapeutic  activities;Therapeutic exercise;Balance training;Patient/family education   PT Goals (Current goals can be found in the Care Plan section) Acute Rehab PT Goals Patient Stated Goal: to get better and move in to her new community PT Goal Formulation: With patient Time For Goal Achievement: 11/11/15 Potential to Achieve Goals: Good    Frequency Min 2X/week   Barriers to discharge Decreased caregiver support      Co-evaluation               End of Session Equipment Utilized During Treatment: Gait belt;Oxygen Activity Tolerance: Patient limited by fatigue Patient left: in chair;with call bell/phone within reach;with chair alarm set Nurse Communication: Mobility status         Time: [id number] PT Time Calculation (min) (ACUTE ONLY): 24 min   Charges:   PT Evaluation $PT Eval Moderate Complexity: 1 Procedure PT Treatments $Therapeutic Activity: 8-22 mins   PT G CodesDuncan Dull 11-23-2015, 12:37 PM Alben Deeds, [place] DPT  [phone]

## 2015-10-28 NOTE — Clinical Social Work Placement (Signed)
   CLINICAL SOCIAL WORK PLACEMENT  NOTE  Date:  10/28/2015  Patient Details  Name: Bianca [name] MRN: [id number] Date of Birth: [birth year]  Clinical Social Work is seeking post-discharge placement for this patient at the [place] level of care (*CSW will initial, date and re-position this form in  chart as items are completed):  Yes   Patient/family provided with [facility] Work Department's list of facilities offering this level of care within the geographic area requested by the patient (or if unable, by the patient's family).  Yes   Patient/family informed of their freedom to choose among providers that offer the needed level of care, that participate in Medicare, Medicaid or managed care program needed by the patient, have an available bed and are willing to accept the patient.  Yes   Patient/family informed of [facility]'s ownership interest in [facility], as well as of the fact that they are under no obligation to receive care at these facilities.  PASRR submitted to EDS on 10/28/15     PASRR number received on       Existing PASRR number confirmed on       FL2 transmitted to all facilities in geographic area requested by pt/family on 10/28/15     FL2 transmitted to all facilities within larger geographic area on       Patient informed that his/her managed care company has contracts with or will negotiate with certain facilities, including the following:            Patient/family informed of bed offers received.  Patient chooses bed at       Physician recommends and patient chooses bed at      Patient to be transferred to   on  .  Patient to be transferred to facility by       Patient family notified on   of transfer.  Name of family member notified:        PHYSICIAN Please sign FL2     Additional Comment:    Barbette Or, [place]

## 2015-10-28 NOTE — Progress Notes (Signed)
PT evaluation today; recommending SNF at discharge for rehab.  CSW notified to facilitate dc to SNF when medically stable for dc.  Will follow progress.    Reinaldo Raddle, RN, BSN  Trauma/Neuro ICU Case Manager [phone]

## 2015-10-28 NOTE — Clinical Social Work Note (Signed)
Clinical Social Work Assessment  Patient Details  Name: Bianca [name] MRN: [id number] Date of Birth: May 13, 1933  Date of referral:  10/28/15               Reason for consult:  Trauma, Facility Placement                Permission sought to share information with:  Family Supports Permission granted to share information::  Yes, Verbal Permission Granted  Name::     Ricky Doan  Relationship::  Son  Contact Information:  [phone]  Housing/Transportation Living arrangements for the past 2 months:  [place] of Information:  Patient, Adult Children Patient Interpreter Needed:  None Criminal Activity/Legal Involvement Pertinent to Current Situation/Hospitalization:  No - Comment as needed Significant Relationships:  Adult Children Lives with:  Self Do you feel safe going back to the place where you live?  No Need for family participation in patient care:  Yes (Comment)  Care giving concerns:  Patient son states that he prefers placement in [place] and plans to communicate with family friends regarding facility choice.  Patient son is aware that patient options may be limited to who is in network with patient current insurance provider.   Social Worker assessment / plan:  Holiday representative met with patient at bedside and spoke with patient son over the phone (with patient permission) to offer support and discuss patient needs at discharge.  Patient states that her family has been making arrangements so that she does not return home at discharge.  Patient has had several falls and no longer feels safe at home alone.  Patient family has made arrangements for patient to move to [facility] and is agreeable with SNF placement prior.  CSW to complete FL2 and initiate SNF search in [place] area.  CSW remains available for support and to provide patient and son with available bed offers.  Employment status:  Retired Office manager PT Recommendations:  [place] / Referral to community resources:  [place]  Patient/Family's Response to care:  Patient and patient son both verbalized understanding of CSW role and appreciation for support and concern.  Patient is agreeable with continued communication with her son and daughter in law to assist with placement needs.  Patient/Family's Understanding of and Emotional Response to Diagnosis, Current Treatment, and Prognosis:  Patient and family understanding of patient limitations and need for more assistance.  Patient states that she is coping well with the move and is looking forward to a "new chapter."  Emotional Assessment Appearance:  Appears younger than stated age Attitude/Demeanor/Rapport:   (Appropriate and Engaged) Affect (typically observed):  Appropriate, Calm, Hopeful, Pleasant Orientation:  Oriented to Self, Oriented to Place, Oriented to  Time, Oriented to Situation, Fluctuating Orientation (Suspected and/or reported Sundowners) Alcohol / Substance use:  Not Applicable Psych involvement (Current and /or in the community):  No (Comment)  Discharge Needs  Concerns to be addressed:  Discharge Planning Concerns Readmission within the last 30 days:  No Current discharge risk:  Physical Impairment Barriers to Discharge:  Continued Medical Work up  [employer], [place]

## 2015-10-28 NOTE — Progress Notes (Signed)
Trauma Service Note  Subjective: Patient had significant urinary pain last night.  Turns out that she has a UTI.  Objective: Vital signs in last 24 hours: Temp:  [99 F (37.2 C)-99.7 F (37.6 C)] 99 F (37.2 C) (05/11 1941) Pulse Rate:  [87-110] 87 (05/12 0700) Resp:  [10-23] 10 (05/12 0700) BP: (82-140)/(48-100) 114/61 mmHg (05/12 0700) SpO2:  [93 %-98 %] 96 % (05/12 0700) Last BM Date: 10/24/15 (rec'd miralax, colace, prune juice)  Intake/Output from previous day: 05/11 0701 - 05/12 0700 In: 1320 [P.O.:1320] Out: 900 [Urine:900] Intake/Output this shift:    General: Asleep.  No acute distress.    Lungs: Clear to auscultation  Abd: Soft, firm on the right.  Excellent bowel sounds.  No bowel movement  Extremities: No changes  Neuro: Intact  Lab Results: CBC   Recent Labs  10/27/15 1907 10/28/15 0328  WBC 10.6* 10.4  HGB 7.1* 7.3*  HCT 22.8* 23.8*  PLT 187 201   BMET  Recent Labs  10/27/15 0325 10/28/15 0328  NA 139 142  K 3.8 3.9  CL 99* 97*  CO2 31 34*  GLUCOSE 131* 131*  BUN 23* 17  CREATININE 1.13* 1.10*  CALCIUM 8.7* 8.9   PT/INR  Recent Labs  10/25/15 1238 10/26/15 0655  LABPROT 16.2* 15.6*  INR 1.29 1.22   ABG No results for input(s): PHART, HCO3 in the last 72 hours.  Invalid input(s): PCO2, PO2  Studies/Results: No results found.  Anti-infectives: Anti-infectives    Start     Dose/Rate Route Frequency Ordered Stop   10/28/15 1000  levofloxacin (LEVAQUIN) tablet 500 mg    Comments:  Do not start until after urine culture has been sent.   500 mg Oral Daily 10/28/15 0733        Assessment/Plan: s/p  Advance diet Continue ABX therapy due to Post-op infection Not really postop infection, but the templated menu does not allow for UTI as the reason for antibiotics This does not appear to be a CAUTI since her catheter was placed after the + UA was sent. Will send urine culture. Will allow the patient to get OOB with PT  assistance. Start iron supplementation.  LOS: 3 days   Kathryne Eriksson. Dahlia Bailiff, MD, FACS [phone] Trauma Surgeon 10/28/2015

## 2015-10-28 NOTE — NC FL2 (Signed)
[place] LEVEL OF CARE SCREENING TOOL     IDENTIFICATION  Patient Name: Bianca [name] Birthdate: 10/23/1932 Sex: female Admission Date (Current Location): 10/24/2015  [facility] and [place] Number:  Herbalist and Address:  [facility], [place] [address], [place], [place] [address]      Provider Number: [id number]  Attending Physician Name and Address:  Trauma Md, MD  Relative Name and Phone Number:       Current Level of Care: Hospital Recommended Level of Care: [place] Prior Approval Number:    Date Approved/Denied:   PASRR Number: [id number]  Discharge Plan: SNF    Current Diagnoses: Patient Active Problem List   Diagnosis Date Noted  . Subcutaneous hematoma   . Acute on chronic diastolic (congestive) heart failure ([place])   . Fall 10/25/2015  . Afib ([place]) 01/19/2015  . Long-term (current) use of anticoagulants 07/20/2014  . Persistent atrial fibrillation ([name]) 07/20/2014  . Encounter for therapeutic drug monitoring 07/16/2013  . Chronic systolic heart failure ([place]) 07/16/2013  . History of implantable cardioverter-defibrillator (ICD) placement 07/16/2013  . Morbid obesity ([place]) 07/16/2013  . Chronic anticoagulation 07/16/2013  . Knee osteoarthritis 07/16/2013  . Acute systolic heart failure ([place])   . HYPOKALEMIA 08/09/2010  . PERIPHERAL NEUROPATHY 08/09/2010  . CHF 08/09/2010  . Automatic implantable cardioverter-defibrillator in situ 08/09/2010    Orientation RESPIRATION BLADDER Height & Weight     Self, Time, Situation, Place  O2 (2L) Incontinent Weight: 253 lb 4.9 oz (114.9 kg) Height:  5\' 6"  (167.6 cm)  BEHAVIORAL SYMPTOMS/MOOD NEUROLOGICAL BOWEL NUTRITION STATUS      Continent Diet  AMBULATORY STATUS COMMUNICATION OF NEEDS Skin     Verbally Normal                       Personal Care Assistance Level of Assistance  Bathing, Feeding, Dressing Bathing Assistance: Limited  assistance Feeding assistance: Limited assistance Dressing Assistance: Limited assistance     Functional Limitations Info  Sight, Hearing, Speech Sight Info: Adequate Hearing Info: Adequate Speech Info: Adequate    SPECIAL CARE FACTORS FREQUENCY  PT (By licensed PT), OT (By licensed OT)     PT Frequency: 3 OT Frequency: 3            Contractures Contractures Info: Not present    Additional Factors Info  Code Status, Allergies Code Status Info: Full Code Allergies Info: Quinapril Hcl, Neosporin           Current Medications (10/28/2015):  This is the current hospital active medication list Current Facility-Administered Medications  Medication Dose Route Frequency Provider Last Rate Last Dose  . 0.9 %  sodium chloride infusion   Intravenous Continuous PRN Georganna Skeans, MD      . acetaminophen (TYLENOL) tablet 650 mg  650 mg Oral Q4H PRN Stark Klein, MD   650 mg at 10/28/15 1247  . allopurinol (ZYLOPRIM) tablet 400 mg  400 mg Oral Daily Stark Klein, MD   400 mg at 10/28/15 1052  . cholecalciferol (VITAMIN D) tablet 1,000 Units  1,000 Units Oral Daily Stark Klein, MD   1,000 Units at 10/28/15 1052  . colestipol (COLESTID) tablet 2 g  2 g Oral Daily Stark Klein, MD   2 g at 10/27/15 1656  . docusate sodium (COLACE) capsule 100 mg  100 mg Oral BID Stark Klein, MD   100 mg at 10/28/15 1052  . ferrous gluconate (FERGON) tablet  324 mg  324 mg Oral BID WC Judeth Horn, MD   324 mg at 10/28/15 1057  . furosemide (LASIX) tablet 20 mg  20 mg Oral Daily Satira Mccallum Tillery, PA-C   20 mg at 10/28/15 1052  . gabapentin (NEURONTIN) capsule 800 mg  800 mg Oral TID Stark Klein, MD   800 mg at 10/28/15 1053  . gemfibrozil (LOPID) tablet 600 mg  600 mg Oral BID AC Stark Klein, MD   600 mg at 10/28/15 0900  . levofloxacin (LEVAQUIN) tablet 500 mg  500 mg Oral Daily Judeth Horn, MD   500 mg at 10/28/15 1053  . multivitamin with minerals tablet 1 tablet  1 tablet Oral Daily Stark Klein, MD   1 tablet at 10/28/15 1052  . nitroGLYCERIN (NITROSTAT) SL tablet 0.4 mg  0.4 mg Sublingual Q5 min PRN Stark Klein, MD      . nortriptyline (PAMELOR) capsule 25 mg  25 mg Oral QHS Stark Klein, MD   25 mg at 10/27/15 2143  . ondansetron (ZOFRAN) tablet 4 mg  4 mg Oral Q6H PRN Stark Klein, MD       Or  . ondansetron (ZOFRAN) injection 4 mg  4 mg Intravenous Q6H PRN Stark Klein, MD      . oxyCODONE (Oxy IR/ROXICODONE) immediate release tablet 5-10 mg  5-10 mg Oral Q6H PRN Stark Klein, MD      . polyethylene glycol (MIRALAX / GLYCOLAX) packet 17 g  17 g Oral Daily PRN Georganna Skeans, MD   17 g at 10/28/15 1055  . pravastatin (PRAVACHOL) tablet 80 mg  80 mg Oral QPM Stark Klein, MD   80 mg at 10/27/15 2143     Discharge Medications: Please see discharge summary for a list of discharge medications.  Relevant Imaging Results:  Relevant Lab Results:   Additional Information [id number]  Barbette Or, [place]

## 2015-10-29 LAB — CBC
HEMATOCRIT: 25.7 % — AB (ref 36.0–46.0)
Hemoglobin: 7.7 g/dL — ABNORMAL LOW (ref 12.0–15.0)
MCH: 27.8 pg (ref 26.0–34.0)
MCHC: 30 g/dL (ref 30.0–36.0)
MCV: 92.8 fL (ref 78.0–100.0)
Platelets: 271 10*3/uL (ref 150–400)
RBC: 2.77 MIL/uL — ABNORMAL LOW (ref 3.87–5.11)
RDW: 19.7 % — AB (ref 11.5–15.5)
WBC: 9 10*3/uL (ref 4.0–10.5)

## 2015-10-29 LAB — BASIC METABOLIC PANEL
Anion gap: 12 (ref 5–15)
BUN: 19 mg/dL (ref 6–20)
CHLORIDE: 95 mmol/L — AB (ref 101–111)
CO2: 34 mmol/L — AB (ref 22–32)
Calcium: 9 mg/dL (ref 8.9–10.3)
Creatinine, Ser: 1 mg/dL (ref 0.44–1.00)
GFR calc Af Amer: >60 mL/min (ref >60)
GFR calc non Af Amer: 52 mL/min — ABNORMAL LOW (ref >60)
GLUCOSE: 120 mg/dL — AB (ref 65–99)
POTASSIUM: 3.8 mmol/L (ref 3.5–5.1)
Sodium: 141 mmol/L (ref 135–145)

## 2015-10-29 NOTE — Clinical Social Work Note (Signed)
Patient has been faxed out to [facility] awaiting bed offers.  Jones Broom. [place], MSW, [place] 10/29/2015 6:41 PM

## 2015-10-29 NOTE — Progress Notes (Signed)
Patient ID: Bianca [name], female   DOB: [birth year], 82 y.o.   MRN: [id number]    Subjective: Did well overnight  Objective: Vital signs in last 24 hours: Temp:  [97.6 F (36.4 C)-98.4 F (36.9 C)] 98.1 F (36.7 C) (05/13 0400) Pulse Rate:  [81-92] 89 (05/13 0700) Resp:  [12-21] 18 (05/13 0700) BP: (88-137)/(53-102) 126/61 mmHg (05/13 0700) SpO2:  [92 %-96 %] 95 % (05/13 0700) Weight:  [114.8 kg (253 lb 1.4 oz)] 114.8 kg (253 lb 1.4 oz) (05/13 0500) Last BM Date: 10/24/15 (prior to admission)  Intake/Output from previous day: 05/12 0701 - 05/13 0700 In: -  Out: 1600 [Urine:1600] Intake/Output this shift:    General appearance: cooperative Resp: few rales Cardio: regular rate and rhythm GI: soft, NT, evolving large R flank hematoma, small blister Extremities: mild edema  Lab Results: CBC   Recent Labs  10/27/15 1907 10/28/15 0328  WBC 10.6* 10.4  HGB 7.1* 7.3*  HCT 22.8* 23.8*  PLT 187 201   BMET  Recent Labs  10/28/15 0328 10/29/15 0418  NA 142 141  K 3.9 3.8  CL 97* 95*  CO2 34* 34*  GLUCOSE 131* 120*  BUN 17 19  CREATININE 1.10* 1.00  CALCIUM 8.9 9.0   PT/INR No results for input(s): LABPROT, INR in the last 72 hours. ABG No results for input(s): PHART, HCO3 in the last 72 hours.  Invalid input(s): PCO2, PO2  Studies/Results: No results found.  Anti-infectives: Anti-infectives    Start     Dose/Rate Route Frequency Ordered Stop   10/28/15 1000  levofloxacin (LEVAQUIN) tablet 500 mg    Comments:  Do not start until after urine culture has been sent.   500 mg Oral Daily 10/28/15 [id number]        Assessment/Plan: Fall Anticoagulated - coumadin on hold R flank hematoma - check CBC, UOB ABL anemia - see above CHF - appreciate cardiology F/U VTE - PAS FEN - diet Dispo - tele/floor, therapies   LOS: 4 days    Georganna Skeans, MD, MPH, FACS Trauma: [phone] General Surgery: [phone]  10/29/2015

## 2015-10-30 LAB — CBC
HEMATOCRIT: 24.6 % — AB (ref 36.0–46.0)
HEMOGLOBIN: 7.4 g/dL — AB (ref 12.0–15.0)
MCH: 28.1 pg (ref 26.0–34.0)
MCHC: 30.1 g/dL (ref 30.0–36.0)
MCV: 93.5 fL (ref 78.0–100.0)
Platelets: 308 10*3/uL (ref 150–400)
RBC: 2.63 MIL/uL — ABNORMAL LOW (ref 3.87–5.11)
RDW: 19.7 % — AB (ref 11.5–15.5)
WBC: 9.4 10*3/uL (ref 4.0–10.5)

## 2015-10-30 LAB — URINE CULTURE
Culture: >=100000 — AB
Special Requests: NORMAL

## 2015-10-30 MED ORDER — HEPARIN SODIUM (PORCINE) 5000 UNIT/ML IJ SOLN
5000.0000 [IU] | Freq: Three times a day (TID) | INTRAMUSCULAR | Status: DC
  Administered 2015-10-30 – 2015-11-03 (×13): 5000 [IU] via SUBCUTANEOUS
  Filled 2015-10-30 (×11): qty 1

## 2015-10-30 NOTE — Progress Notes (Signed)
Jettie Booze notified of patients pacer not pacing at times. She states she will come see patient

## 2015-10-30 NOTE — Progress Notes (Signed)
  Subjective: Didn't sleep well last night but is doing better now Tolerating diet Had a bowel movement yesterday Pain seems better Hemoglobin 7.7.  Stable.  Urine culture growing Escherichia coli.  This is being treated with Levaquin.  Appreciate cardiology advice.  They will decide whether to go back on full anticoagulation as an outpatient.  Appreciate PT involvement. Family expectations are that patient was discharged to rehabilitation or SNF.  Objective: Vital signs in last 24 hours: Temp:  [98 F (36.7 C)-99.5 F (37.5 C)] 98.2 F (36.8 C) (05/14 0432) Pulse Rate:  [84-97] 84 (05/14 0432) Resp:  [13-18] 17 (05/14 0432) BP: (120-138)/(53-103) 128/61 mmHg (05/14 0432) SpO2:  [94 %-97 %] 94 % (05/14 0432) Weight:  [110.3 kg (243 lb 2.7 oz)-111 kg (244 lb 11.4 oz)] 110.3 kg (243 lb 2.7 oz) (05/14 0432) Last BM Date: 10/29/15  Intake/Output from previous day: 05/13 0701 - 05/14 0700 In: 480 [P.O.:480] Out: 700 [Urine:700] Intake/Output this shift:    General appearance: Sleepy but arousable and appropriate.  No distress. Resp: clear to auscultation bilaterally Cardio: regular rate and rhythm, S1, S2 normal, no murmur, click, rub or gallop GI: soft, non-tender; bowel sounds normal; no masses,  no organomegaly Incision/Wound: Large right flank hematoma with ecchymoses.  Stable.  Solid and firm.  Lab Results:   Recent Labs  10/28/15 0328 10/29/15 0841  WBC 10.4 9.0  HGB 7.3* 7.7*  HCT 23.8* 25.7*  PLT 201 271   BMET  Recent Labs  10/28/15 0328 10/29/15 0418  NA 142 141  K 3.9 3.8  CL 97* 95*  CO2 34* 34*  GLUCOSE 131* 120*  BUN 17 19  CREATININE 1.10* 1.00  CALCIUM 8.9 9.0   PT/INR No results for input(s): LABPROT, INR in the last 72 hours. ABG No results for input(s): PHART, HCO3 in the last 72 hours.  Invalid input(s): PCO2, PO2  Studies/Results: No results found.  Anti-infectives: Anti-infectives    Start     Dose/Rate Route Frequency  Ordered Stop   10/28/15 1000  levofloxacin (LEVAQUIN) tablet 500 mg    Comments:  Do not start until after urine culture has been sent.   500 mg Oral Daily 10/28/15 [id number]        Assessment/Plan:   Fall Anticoagulated - coumadin on hold.  Cardiology will decide about restarting this as outpatient. R flank hematoma - stable.  Doubt active bleeding.  check CBC, UOB UTI.  Growing Escherichia coli.  On Levaquin. ABL anemia - see above CHF - appreciate cardiology F/U VTE - PAS.  Will gently start subcutaneous heparin FEN - diet Dispo - tele/floor, therapies, SNF.   LOS: 5 days    Bianca [name] M 10/30/2015

## 2015-10-30 NOTE — Progress Notes (Signed)
Dr. Hulen Skains notified of Patient's 3 beats of V-Tach

## 2015-10-30 NOTE — Progress Notes (Signed)
Telemetry notified me at 10:45 that patient had 3 beats of V-tach. Dr. Hulen Skains paged. Awaiting return call.

## 2015-10-31 ENCOUNTER — Encounter (HOSPITAL_COMMUNITY): Payer: Self-pay | Admitting: Physical Medicine & Rehabilitation

## 2015-10-31 DIAGNOSIS — W19XXXD Unspecified fall, subsequent encounter: Secondary | ICD-10-CM

## 2015-10-31 DIAGNOSIS — I480 Paroxysmal atrial fibrillation: Secondary | ICD-10-CM | POA: Insufficient documentation

## 2015-10-31 DIAGNOSIS — N390 Urinary tract infection, site not specified: Secondary | ICD-10-CM

## 2015-10-31 DIAGNOSIS — I5043 Acute on chronic combined systolic (congestive) and diastolic (congestive) heart failure: Secondary | ICD-10-CM | POA: Insufficient documentation

## 2015-10-31 DIAGNOSIS — G5793 Unspecified mononeuropathy of bilateral lower limbs: Secondary | ICD-10-CM | POA: Insufficient documentation

## 2015-10-31 DIAGNOSIS — D62 Acute posthemorrhagic anemia: Secondary | ICD-10-CM

## 2015-10-31 DIAGNOSIS — B9620 Unspecified Escherichia coli [E. coli] as the cause of diseases classified elsewhere: Secondary | ICD-10-CM | POA: Insufficient documentation

## 2015-10-31 DIAGNOSIS — G4733 Obstructive sleep apnea (adult) (pediatric): Secondary | ICD-10-CM | POA: Insufficient documentation

## 2015-10-31 LAB — CBC
HCT: 24.7 % — ABNORMAL LOW (ref 36.0–46.0)
Hemoglobin: 7.8 g/dL — ABNORMAL LOW (ref 12.0–15.0)
MCH: 29.7 pg (ref 26.0–34.0)
MCHC: 31.6 g/dL (ref 30.0–36.0)
MCV: 93.9 fL (ref 78.0–100.0)
PLATELETS: 331 10*3/uL (ref 150–400)
RBC: 2.63 MIL/uL — AB (ref 3.87–5.11)
RDW: 19.9 % — AB (ref 11.5–15.5)
WBC: 9.5 10*3/uL (ref 4.0–10.5)

## 2015-10-31 NOTE — Progress Notes (Signed)
Dr. Delice Lesch with Rehab concurs with therapy that SNF rehab is recommended at this time, Not inpt rehab admission. Noted SW making those arrangements. We will sign off. [phone]

## 2015-10-31 NOTE — Consult Note (Signed)
Physical Medicine and Rehabilitation Consult   Reason for Consult: Fall with large right flank hematoma with pain and difficulty walking.  Referring Physician:  Dr. Rosendo Gros.    HPI: Bianca [name] is a 82 y.o. female with history of neuropathy BLE with falls/question claudication?,  NICM with A fib--chronic coumadin, OSA- with oxygen at nights, chronic systolic CHF s/p biventricular ICD who was admitted on 10/24/15 with fall and scalp hematoma. CT head without acute bleed and moderate generalized atrophy. CT abdomen pelvis with active hemorrhage into large subcutaneous hematoma in right flank region and coumadin reversed with Vitamin K and FFP.  ABLA treated with 2 units of PRBC and she  developed fluid overload and cardiology consulted for assistance.  She was treated with low dose IV diuretic due low BP. She had further drop in H/H requiring platelets as well as additional units PRBC on 5/10. Volume overload improving and no reports of dyspnea. She was started on Levaquin due to evidence of E coli UTI and coumadin to be held till follow up with cardiology on outpatient basis.  PT evaluation done this weekend and  CIR recommended by MD due to dizziness, RLE pain /edema, balance deficits and decline in mobility.   Has had assistance for home management and meal prep for past 3 years.  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 Has sme one in three times a week and family checks in the afternoon.   Plans were in place to move to Independent living facility last week. She has been sitting in the chair daily since Friday for a couple of hours.   Review of Systems  Constitutional: Positive for malaise/fatigue.  HENT: Positive for hearing loss.   Eyes: Negative for blurred vision and double vision.  Respiratory: Negative for cough and shortness of breath.   Cardiovascular: Negative for chest pain and palpitations.  Gastrointestinal:  Positive for diarrhea (occasionally due to microscopic colitis) and constipation. Negative for heartburn and nausea.       Poor appetite  Genitourinary: Positive for dysuria.       Stress incontinence  Musculoskeletal: Positive for myalgias and back pain.  Skin: Negative for itching and rash.  Neurological: Positive for headaches (lright frontal).  Psychiatric/Behavioral: Positive for memory loss. The patient does not have insomnia.   All other systems reviewed and are negative.     Past Medical History  Diagnosis Date  . Pacemaker   . ICD (implantable cardiac defibrillator) in place   . Myocardial infarction ([place]) 2008  . Sleep apnea   . Arthritis   . Neuromuscular disorder ([place])   . CHF (congestive heart failure) ([place])   . Hypertension   . Dysrhythmia   . Chronic systolic heart failure ([place])   . Acute systolic heart failure [facility])     Past Surgical History  Procedure Laterality Date  . Cholecystectomy    . Abdominal hysterectomy    . Ankle surgery    . Insert / replace / remove pacemaker  2008  . Knee ligament reconstruction  2012  . Tonsillectomy    . Cardiac catheterization    . Eye surgery      Family History  Problem Relation Age of Onset  . Tuberculosis Father   . Stroke Mother   . Congestive Heart Failure Mother   . Diabetes Brother   . Stroke Brother   . Cancer Brother   .  Heart attack Son     Social History:  Moving in to [place]. She reports that she has quit smoking 45 year ago.  She has quit using smokeless tobacco. She reports that she does not drink alcohol or use illicit drugs.    Allergies  Allergen Reactions  . Quinapril Hcl Swelling    Tongue and throat  . Neosporin [Neomycin-Polymyxin-Gramicidin] Rash    Medications Prior to Admission  Medication Sig Dispense Refill  . allopurinol (ZYLOPRIM) 100 MG tablet Take 100 mg by mouth daily. Takes along with a 300 mg tablet to equal 400 mg    . allopurinol (ZYLOPRIM) 300 MG tablet Take  300 mg by mouth daily. Takes along with a 100mg  tablet to equal 400mg     . Calcium Carbonate-Vitamin D (CALCIUM 600+D) 600-400 MG-UNIT per tablet Take 1 tablet by mouth 2 (two) times daily.    . carvedilol (COREG) 12.5 MG tablet Take 1 tablet (12.5 mg total) by mouth 2 (two) times daily. 180 tablet 1  . Cholecalciferol (VITAMIN D3) 1000 UNITS CAPS Take 1 capsule by mouth daily.    . colestipol (COLESTID) 1 G tablet Take 2 g by mouth daily.     . furosemide (LASIX) 40 MG tablet Take 20 mg by mouth daily.    Marland Kitchen gabapentin (NEURONTIN) 800 MG tablet Take 800 mg by mouth 3 (three) times daily.     Marland Kitchen gemfibrozil (LOPID) 600 MG tablet Take 600 mg by mouth 2 (two) times daily before a meal.    . losartan (COZAAR) 25 MG tablet Take 25 mg by mouth daily.    . Multiple Vitamin (MULTIVITAMIN) tablet Take 1 tablet by mouth daily.    . nitroGLYCERIN (NITROSTAT) 0.4 MG SL tablet Place 1 tablet (0.4 mg total) under the tongue every 5 (five) minutes as needed for chest pain. 25 tablet 6  . nortriptyline (PAMELOR) 25 MG capsule Take 25 mg by mouth at bedtime.    . pravastatin (PRAVACHOL) 80 MG tablet Take 1 tablet (80 mg total) by mouth every evening. 90 tablet 1  . spironolactone (ALDACTONE) 25 MG tablet Take 1 tablet (25 mg total) by mouth daily. 90 tablet 2  . warfarin (COUMADIN) 5 MG tablet 5 mg by mouth daily, except 7.5 mg on Thursdays      Home: [place] expects to be discharged to:: Skilled nursing facility Living Arrangements: Children  Functional History: Prior Function Level of Independence: Independent Functional Status:  Mobility: Bed Mobility Overal bed mobility: Needs Assistance, +2 for physical assistance Bed Mobility: Rolling, Supine to Sit Rolling: Min assist Supine to sit: Mod assist, +2 for physical assistance General bed mobility comments: assist for positioning, elevation of trunk and rotation of hips to EOB Transfers Overall transfer level: Needs  assistance Equipment used: Rolling walker (2 wheeled) Transfers: Sit to/from Stand, [employer] Transfers Sit to Stand: Mod assist, +2 physical assistance Stand pivot transfers: Mod assist, +2 physical assistance (to [facility]) General transfer comment: Moderate assist for stability, significant posterior list, assist to power up to standing Ambulation/Gait Ambulation/Gait assistance: Mod assist, +2 physical assistance Ambulation Distance (Feet): 8 Feet Assistive device: Rolling walker (2 wheeled) Gait Pattern/deviations: Step-to pattern, Decreased stride length, Shuffle, Leaning posteriorly, Trunk flexed, Wide base of support General Gait Details: decreased stability noted, + dizziness with standing, resolved with sitting (desaturation on room air to 88%) Gait velocity: decreased Gait velocity interpretation: <1.8 ft/sec, indicative of risk for recurrent falls    ADL:    Cognition: Cognition  Overall Cognitive Status: Within Functional Limits for tasks assessed Orientation Level: Oriented to person, Oriented to situation, Oriented to place, Oriented to time Cognition Arousal/Alertness: Awake/alert Behavior During Therapy: [facility] for tasks assessed/performed Overall Cognitive Status: Within Functional Limits for tasks assessed   Blood pressure 110/68, pulse 96, temperature 98 F (36.7 C), temperature source Oral, resp. rate 17, height 5\' 6"  (1.676 m), weight 111.3 kg (245 lb 6 oz), SpO2 94 %. Physical Exam  Nursing note and vitals reviewed. Constitutional: She is oriented to person, place, and time. She appears well-developed and well-nourished.  HENT:  Head: Normocephalic and atraumatic.  Mouth/Throat: Oropharyngeal exudate present.  Eyes: Conjunctivae and EOM are normal. Pupils are equal, round, and reactive to light.  Neck: Normal range of motion. Neck supple.  Cardiovascular: An irregularly irregular rhythm present.  Murmur heard. Respiratory: Effort normal. No respiratory  distress. She has wheezes.  Increase WOB with conversation.  + [place]  GI: Soft. Bowel sounds are normal. She exhibits no distension. There is no tenderness.  Musculoskeletal: She exhibits edema. She exhibits no tenderness.  Edema with ecchymosis right lateral abdomen and flank.   Neurological: She is alert and oriented to person, place, and time.  HOH.  Able to follow basic commands without difficulty.   Sensation intact light touch Motor: Bilateral upper extremity is 5/5 proximal to distal Bilateral lower extremity: Hip flexion, knee extension 4/5, ankle dorsi/plantar flexion 5/5  Skin: Skin is warm and dry.  Erythema and edema right flank and lateral abdomen  Psychiatric: She has a normal mood and affect. Her behavior is normal. Thought content normal.    Results for orders placed or performed during the hospital encounter of 10/24/15 (from the past 24 hour(s))  CBC     Status: Abnormal   Collection Time: 10/31/15  6:29 AM  Result Value Ref Range   WBC 9.5 4.0 - 10.5 K/uL   RBC 2.63 (L) 3.87 - 5.11 MIL/uL   Hemoglobin 7.8 (L) 12.0 - 15.0 g/dL   HCT 24.7 (L) 36.0 - 46.0 %   MCV 93.9 78.0 - 100.0 fL   MCH 29.7 26.0 - 34.0 pg   MCHC 31.6 30.0 - 36.0 g/dL   RDW 19.9 (H) 11.5 - 15.5 %   Platelets 331 150 - 400 K/uL   No results found.  Assessment/Plan: Diagnosis: Debility Labs and images independently reviewed.  Records reviewed and summated above.  1. Does the need for close, 24 hr/day medical supervision in concert with the patient's rehab needs make it unreasonable for this patient to be served in a less intensive setting? Potentially  2. Co-Morbidities requiring supervision/potential complications: neuropathy BLE,   NICM with A fib (continue meds, monitor cardiac status with increased physical activity), OSA (continue supplemental O2, monitor for daytime somnolence), chronic systolic CHF s/p biventricular ICD (Monitor in accordance with increased physical activity and avoid UE  resistance excercises), ABLA (transfuse if necessary to ensure appropriate perfusion for increased activity tolerance), UTI (cont meds) 3. Due to safety, skin/wound care, disease management, medication administration and patient education, does the patient require 24 hr/day rehab nursing? Yes 4. Does the patient require coordinated care of a physician, rehab nurse, PT (1-2 hrs/day, 5 days/week) and OT (1-2 hrs/day, 5 days/week) to address physical and functional deficits in the context of the above medical diagnosis(es)? Yes Addressing deficits in the following areas: balance, endurance, locomotion, strength, transferring, bathing, dressing, toileting and psychosocial support 5. Can the patient actively participate in an intensive therapy program of at  least 3 hrs of therapy per day at least 5 days per week? Yes 6. The potential for patient to make measurable gains while on inpatient rehab is good 7. Anticipated functional outcomes upon discharge from inpatient rehab are n/a  with PT, n/a with OT, n/a with SLP. 8. Estimated rehab length of stay to reach the above functional goals is: NA 9. Does the patient have adequate social supports and living environment to accommodate these discharge functional goals? N/A 10. Anticipated D/C setting: Other 11. Anticipated post D/C treatments: SNF 12. Overall Rehab/Functional Prognosis: good  RECOMMENDATIONS: This patient's condition is appropriate for continued rehabilitative care in the following setting: [place] patient history and current functional status, she will unlikely be able to obtain an independent level of functioning after a short IRF stay. Agree with PT, recommend SNF after medically stable. Patient has agreed to participate in recommended program. Potentially Note that insurance prior authorization may be required for reimbursement for recommended care.  Comment: Rehab Admissions Coordinator to follow up.  Delice Lesch, MD 10/31/2015

## 2015-10-31 NOTE — Clinical Social Work Note (Signed)
Clinical Social Worker continuing to follow patient and family for support and discharge planning needs.  CSW provided available bed offers to patient and patient son over the phone - patient son to notify of bed choice by tomorrow morning.  Patient and patient son both aware that patient may be ready for discharge as early as tomorrow.  CSW to pursue insurance authorization once bed chosen.  CSW remains available for support and to facilitate patient discharge needs once medically stable.  Bianca [name], [place]

## 2015-10-31 NOTE — Progress Notes (Signed)
  Subjective: Pt feels like she's doing better today.   Objective: Vital signs in last 24 hours: Temp:  [98 F (36.7 C)-98.8 F (37.1 C)] 98 F (36.7 C) (05/15 0634) Pulse Rate:  [94-108] 96 (05/15 0634) Resp:  [16-17] 17 (05/15 0634) BP: (106-115)/(67-69) 110/68 mmHg (05/15 0634) SpO2:  [92 %-100 %] 94 % (05/15 0634) Weight:  [111.3 kg (245 lb 6 oz)] 111.3 kg (245 lb 6 oz) (05/15 0634) Last BM Date: 10/29/15  Intake/Output from previous day: 05/14 0701 - 05/15 0700 In: 840 [P.O.:840] Out: 2000 [Urine:2000] Intake/Output this shift:    General appearance: alert and cooperative GI: soft, non-tender; bowel sounds normal; no masses,  no organomegaly Skin: L flank hematoma stable  Lab Results:   Recent Labs  10/30/15 0633 10/31/15 0629  WBC 9.4 9.5  HGB 7.4* 7.8*  HCT 24.6* 24.7*  PLT 308 331   BMET  Recent Labs  10/29/15 0418  NA 141  K 3.8  CL 95*  CO2 34*  GLUCOSE 120*  BUN 19  CREATININE 1.00  CALCIUM 9.0   Anti-infectives: Anti-infectives    Start     Dose/Rate Route Frequency Ordered Stop   10/28/15 1000  levofloxacin (LEVAQUIN) tablet 500 mg    Comments:  Do not start until after urine culture has been sent.   500 mg Oral Daily 10/28/15 [id number]        Assessment/Plan: Fall Anticoagulated - coumadin on hold.  Cardiology will decide about restarting this as outpatient. R flank hematoma - stable. Hct stable, mobilizing UTI.  E.coli UTI, abx course completed ABL anemia - see above CHF/Dysrhythmias -cardiology to eval  VTE - PAS,SQH FEN - diet Dispo - tele/floor, therapies, rehab consult placed   LOS: 6 days    Rosario Jacks., [facility] 10/31/2015

## 2015-10-31 NOTE — Progress Notes (Signed)
Physical Therapy Treatment Patient Details Name: Bianca [name] MRN: [id number] DOB: 01-22-1933 Today's Date: 10/31/2015    History of Present Illness Pt admitted on 10/23/15 s/p fall with large Rt flank hematoma    PT Comments    Pt performed increased mobility with decreased assist.  Pt now required assist +1 with +2 for chair follow.  Pt remains motivated to participate and improve function.  Will remain to recommend [facility] SNF for continued rehab before d/c home.    Follow Up Recommendations  SNF;Supervision/Assistance - 24 hour     Equipment Recommendations  Rolling walker with 5" wheels    Recommendations for Other Services       Precautions / Restrictions Precautions Precautions: Fall Precaution Comments: watch O2 saturations Restrictions Weight Bearing Restrictions: No    Mobility  Bed Mobility Overal bed mobility: Needs Assistance;+2 for physical assistance Bed Mobility: Rolling;Supine to Sit Rolling: Mod assist         General bed mobility comments: Pt required assist for LE advancement to edge of bed, scooting with draw pad and trunk elevation.  Once in sitting patient able to scoot with supervision but only when feet are touching to the floor.    Transfers Overall transfer level: Needs assistance Equipment used: Rolling walker (2 wheeled) Transfers: Sit to/from [facility] Sit to Stand: Mod assist Stand pivot transfers: Mod assist       General transfer comment: Pt required cues for hand placement, pushing from surface and reaching back for surface.  Poor eccentric loading noted when transferring from stand to sit.   Ambulation/Gait Ambulation/Gait assistance: Min assist Ambulation Distance (Feet): 42 Feet Assistive device: Rolling walker (2 wheeled) Gait Pattern/deviations: Step-to pattern;Decreased stride length;Trunk flexed;Wide base of support Gait velocity: decreased   General Gait Details: No c/o dizziness, Valgus strain noted on  RLE.  Pt required cues for increasing stride length and improve trunk control while maintaining close position to RW.     Stairs            Wheelchair Mobility    Modified Rankin (Stroke Patients Only)       Balance Overall balance assessment: Needs assistance   Sitting balance-Leahy Scale: Fair       Standing balance-Leahy Scale: Poor                      Cognition Arousal/Alertness: Awake/alert Behavior During Therapy: WFL for tasks assessed/performed Overall Cognitive Status: Within Functional Limits for tasks assessed                      Exercises Total Joint Exercises Ankle Circles/Pumps: AROM;Both;10 reps;Supine Quad Sets: AROM;Both;10 reps;Supine Heel Slides: Both;10 reps;Supine;AAROM Hip ABduction/ADduction: Both;10 reps;Supine;AAROM Straight Leg Raises: Both;10 reps;Supine;AAROM    General Comments        Pertinent Vitals/Pain Pain Assessment: Faces Pain Score: 4  Pain Location: R flank Pain Descriptors / Indicators: Discomfort;Grimacing;Guarding Pain Intervention(s): Limited activity within patient's tolerance;Monitored during session;Repositioned    Home Living                      Prior Function            PT Goals (current goals can now be found in the care plan section) Acute Rehab PT Goals Patient Stated Goal: to get better and move in to her new community Potential to Achieve Goals: Good Progress towards PT goals: Progressing toward goals    Frequency  Min 2X/week  PT Plan      Co-evaluation             End of Session Equipment Utilized During Treatment: Gait belt;Oxygen Activity Tolerance: Patient limited by fatigue Patient left: in chair;with call bell/phone within reach;with chair alarm set     Time: [id number] PT Time Calculation (min) (ACUTE ONLY): 25 min  Charges:  $Gait Training: 8-22 mins $Therapeutic Exercise: 8-22 mins                    G Codes:      Cristela Blue 2015/11/11, 1:19 PM Governor Rooks, PTA pager [phone]

## 2015-11-01 ENCOUNTER — Inpatient Hospital Stay (HOSPITAL_COMMUNITY): Payer: PPO

## 2015-11-01 NOTE — Progress Notes (Signed)
.     Advanced Heart Failure Rounding Note  Referring Physician: Dr Hulen Skains Primary Physician: Maurice Small, MD Primary Cardiologist: Dr Marlou Porch    Subjective:   Complaining of mild dyspnea at rest. Denies CP.   Objective:   Weight Range: 240 lb 8.4 oz (109.1 kg) Body mass index is 38.84 kg/(m^2).   Vital Signs:   Temp:  [98 F (36.7 C)-98.6 F (37 C)] 98.1 F (36.7 C) (05/16 0538) Pulse Rate:  [97-98] 98 (05/16 0538) Resp:  [17-18] 18 (05/15 2128) BP: (117-141)/(55-62) 141/62 mmHg (05/16 0538) SpO2:  [93 %-96 %] 93 % (05/16 0538) Weight:  [240 lb 8.4 oz (109.1 kg)] 240 lb 8.4 oz (109.1 kg) (05/16 0538) Last BM Date: 10/29/15  Weight change: Filed Weights   10/30/15 0432 10/31/15 0634 11/01/15 0538  Weight: 243 lb 2.7 oz (110.3 kg) 245 lb 6 oz (111.3 kg) 240 lb 8.4 oz (109.1 kg)    Intake/Output:   Intake/Output Summary (Last 24 hours) at 11/01/15 0839 Last data filed at 10/31/15 2200  Gross per 24 hour  Intake    480 ml  Output   1250 ml  Net   -770 ml     Physical Exam: General: Lying in bed. Mild dyspnea talking.  HEENT: normal Neck: supple. JVP ~ 10 . Carotids 2+ bilat; no bruits. No thyromegaly or lymphadenopathy noted.  Cor: PMI nondisplaced. RRR, no M/G/R Lungs: Decreased in the bases. On 2 liters [place].  Abdomen: obese soft, nondistended. Large flank hematoma. No HSM. No bruits or masses. Good bowel sounds. Extremities: no cyanosis, clubbing, rash, Trace edema Neuro: alert & orientedx3, cranial nerves grossly intact. moves all 4 extremities w/o difficulty. Affect pleasant  Telemetry: Reviewed, NSR  Labs: CBC  Recent Labs  10/30/15 0633 10/31/15 0629  WBC 9.4 9.5  HGB 7.4* 7.8*  HCT 24.6* 24.7*  MCV 93.5 93.9  PLT 308 [id number]   Basic Metabolic Panel No results for input(s): NA, K, CL, CO2, GLUCOSE, BUN, CREATININE, CALCIUM, MG, PHOS in the last 72 hours. Liver Function Tests No results for input(s): AST, ALT, ALKPHOS, BILITOT, PROT, ALBUMIN in  the last 72 hours. No results for input(s): LIPASE, AMYLASE in the last 72 hours. Cardiac Enzymes No results for input(s): CKTOTAL, CKMB, CKMBINDEX, TROPONINI in the last 72 hours.  BNP: BNP (last 3 results) No results for input(s): BNP in the last 8760 hours.  ProBNP (last 3 results) No results for input(s): PROBNP in the last 8760 hours.   D-Dimer No results for input(s): DDIMER in the last 72 hours. Hemoglobin A1C No results for input(s): HGBA1C in the last 72 hours. Fasting Lipid Panel No results for input(s): CHOL, HDL, LDLCALC, TRIG, CHOLHDL, LDLDIRECT in the last 72 hours. Thyroid Function Tests No results for input(s): TSH, T4TOTAL, T3FREE, THYROIDAB in the last 72 hours.  Invalid input(s): FREET3  Other results:     Imaging/Studies:  No results found.  Latest Echo  Latest Cath   Medications:     Scheduled Medications: . allopurinol  400 mg Oral Daily  . cholecalciferol  1,000 Units Oral Daily  . colestipol  2 g Oral Daily  . docusate sodium  100 mg Oral BID  . ferrous gluconate  324 mg Oral BID WC  . furosemide  20 mg Oral Daily  . gabapentin  800 mg Oral TID  . gemfibrozil  600 mg Oral BID AC  . heparin subcutaneous  5,000 Units Subcutaneous Q8H  . multivitamin with minerals  1 tablet  Oral Daily  . nortriptyline  25 mg Oral QHS  . pravastatin  80 mg Oral QPM    Infusions: . sodium chloride      PRN Medications: sodium chloride, acetaminophen, nitroGLYCERIN, ondansetron **OR** ondansetron (ZOFRAN) IV, oxyCODONE, polyethylene glycol   Assessment   1. Fall with large R flank hematoma 2. NICM EF 35% s/p BiV ICD - Medtronic. -> EF 50-55% Grade I DD on echo 5/10 3. AF  4. Morbid obesity with h/o CO2 retention  5. Acute blood loss anemia  Plan  Volume overloaded with mild dyspnea at rest. Give 40 mg IV lasix 2. CXR. BMET in am.   Would hold d/c. Possible d/c tomorrow.    Length of Stay: 7  Amy Clegg NP-C  11/01/2015, 8:39  AM  Advanced Heart Failure Team Pager [phone] (M-F; 7a - 4p)  Please contact [facility] for night-coverage after hours (4p -7a ) and weekends on [email or web address]  Patient seen and examined with Darrick Grinder, NP. We discussed all aspects of the encounter. I agree with the assessment and plan as stated above.   Agree with IV lasix. CXR ok. Will follow.  Bensimhon, Daniel,MD 4:57 PM

## 2015-11-01 NOTE — Progress Notes (Signed)
Patient ID: Bianca [name], female   DOB: April 12, 1933, 82 y.o.   MRN: [id number]   LOS: 7 days   Subjective: C/o mild HA this am, otherwise ok.   Objective: Vital signs in last 24 hours: Temp:  [98 F (36.7 C)-98.6 F (37 C)] 98.1 F (36.7 C) (05/16 0538) Pulse Rate:  [97-98] 98 (05/16 0538) Resp:  [17-18] 18 (05/15 2128) BP: (117-141)/(55-62) 141/62 mmHg (05/16 0538) SpO2:  [93 %-96 %] 93 % (05/16 0538) Weight:  [109.1 kg (240 lb 8.4 oz)] 109.1 kg (240 lb 8.4 oz) (05/16 0538) Last BM Date: 10/29/15   Physical Exam General appearance: alert and no distress Resp: clear to auscultation bilaterally Cardio: irregularly irregular rhythm GI: normal findings: bowel sounds normal and soft, non-tender   Assessment/Plan: Fall R flank hematoma  ABL anemia - Stable CHF - cards to f/u today before discharge FEN - diet VTE - SCD's Dispo - Likely to SNF today    Lisette Abu, PA-C Pager: [phone] General Trauma PA Pager: [phone]  11/01/2015

## 2015-11-01 NOTE — Consult Note (Signed)
   [facility] Inpatient Consult   11/01/2015  KEYLEIGH HANKES [birth year] [id number]   Patient screened for potential [facility] Care Management services. Chart reviewed. Noted discharge plan is for SNF.  There are no identifiable [facility] Care Management needs at this time. If patient's post hospital needs change, please place a [facility] Care Management consult. For questions please contact:  Marthenia Rolling, [place], RN,BSN [facility] Liaison [phone]

## 2015-11-01 NOTE — Progress Notes (Signed)
Physical Therapy Treatment Patient Details Name: Bianca [name] MRN: [id number] DOB: 1932/09/04 Today's Date: 11/01/2015    History of Present Illness Pt admitted on 10/23/15 s/p fall with large Rt flank hematoma    PT Comments    Pt performed gait training with cues for encouragement.  Pt maintained sats greater than 90% and HR elevated to 124.  Pt on 2L O2 during tx.    Follow Up Recommendations  SNF;Supervision/Assistance - 24 hour     Equipment Recommendations  Rolling walker with 5" wheels    Recommendations for Other Services       Precautions / Restrictions Precautions Precautions: Fall Precaution Comments: watch O2 saturations Restrictions Weight Bearing Restrictions: No    Mobility  Bed Mobility               General bed mobility comments: Pt received in recliner chair.    Transfers Overall transfer level: Needs assistance Equipment used: Rolling walker (2 wheeled) Transfers: Sit to/from Stand Sit to Stand: Mod assist Stand pivot transfers: Min assist       General transfer comment: Pt required cues for hand placement, pushing from surface and reaching back for surface.  Poor eccentric loading noted when transferring from stand to sit but able to slow more than previous session.    Ambulation/Gait Ambulation/Gait assistance: Min guard;Min assist Ambulation Distance (Feet): 64 Feet Assistive device: Rolling walker (2 wheeled) Gait Pattern/deviations: Step-to pattern;Decreased stride length;Trunk flexed;Wide base of support Gait velocity: decreased   General Gait Details: No c/o dizziness, Valgus strain noted on RLE.  Pt required cues for increasing stride length and improve trunk control while maintaining close position to RW.  SPO2 90% with HR 124 bpm.     Stairs            Wheelchair Mobility    Modified Rankin (Stroke Patients Only)       Balance     Sitting balance-Leahy Scale: Fair       Standing balance-Leahy Scale:  Poor                      Cognition Arousal/Alertness: Awake/alert Behavior During Therapy: WFL for tasks assessed/performed Overall Cognitive Status: Within Functional Limits for tasks assessed                      Exercises      General Comments        Pertinent Vitals/Pain Pain Assessment: Faces Faces Pain Scale: Hurts little more Pain Location: R flank Pain Descriptors / Indicators: Grimacing;Guarding;Discomfort Pain Intervention(s): Limited activity within patient's tolerance;Premedicated before session;Repositioned    Home Living                      Prior Function            PT Goals (current goals can now be found in the care plan section) Acute Rehab PT Goals Patient Stated Goal: to get better and move in to her new community Potential to Achieve Goals: Good Progress towards PT goals: Progressing toward goals    Frequency  Min 2X/week    PT Plan      Co-evaluation             End of Session Equipment Utilized During Treatment: Gait belt;Oxygen Activity Tolerance: Patient limited by fatigue Patient left: in chair;with call bell/phone within reach;with chair alarm set     Time: [id number] PT Time Calculation (min) (ACUTE ONLY): 31  min  Charges:  $Gait Training: 8-22 mins $Therapeutic Activity: 8-22 mins                    G Codes:      Cristela Blue Nov 13, 2015, 3:13 PM  Governor Rooks, PTA pager [phone]

## 2015-11-01 NOTE — Clinical Social Work Note (Signed)
Clinical Social Worker continuing to follow patient and family for support and discharge planning needs.  CSW received voicemail from patient son stating that they have narrowed facility options and plan to visit today and tomorrow morning.  CSW updated PA on patient family plans.  CSW contacted insurance company who is working on authorization and will just need to be updated with facility choice on day of discharge.  CSW remains available for support and to facilitate patient discharge plans once medically stable.  Barbette Or, [place]

## 2015-11-02 LAB — BASIC METABOLIC PANEL
Anion gap: 14 (ref 5–15)
BUN: 16 mg/dL (ref 6–20)
CALCIUM: 9.1 mg/dL (ref 8.9–10.3)
CO2: 32 mmol/L (ref 22–32)
CREATININE: 0.9 mg/dL (ref 0.44–1.00)
Chloride: 94 mmol/L — ABNORMAL LOW (ref 101–111)
GFR calc non Af Amer: 59 mL/min — ABNORMAL LOW (ref >60)
GLUCOSE: 124 mg/dL — AB (ref 65–99)
Potassium: 3.4 mmol/L — ABNORMAL LOW (ref 3.5–5.1)
Sodium: 140 mmol/L (ref 135–145)

## 2015-11-02 LAB — BRAIN NATRIURETIC PEPTIDE: B Natriuretic Peptide: 143.9 pg/mL — ABNORMAL HIGH (ref 0.0–100.0)

## 2015-11-02 MED ORDER — POTASSIUM CHLORIDE CRYS ER 20 MEQ PO TBCR
40.0000 meq | EXTENDED_RELEASE_TABLET | Freq: Once | ORAL | Status: AC
  Administered 2015-11-02: 40 meq via ORAL
  Filled 2015-11-02: qty 2

## 2015-11-02 MED ORDER — FUROSEMIDE 40 MG PO TABS
40.0000 mg | ORAL_TABLET | Freq: Every day | ORAL | Status: DC
  Administered 2015-11-03: 40 mg via ORAL
  Filled 2015-11-02: qty 1

## 2015-11-02 NOTE — Clinical Social Work Note (Signed)
Clinical Social Worker continuing to follow patient and family for support and discharge planning needs.  Patient has a bed at [facility] has been notified of patient plans at discharge.  Patient son has requested for ambulance transport and plans to complete paperwork at the facility today for admission tomorrow.  CSW remains available for support and to facilitate patient discharge needs.  Barbette Or, [place]

## 2015-11-02 NOTE — Progress Notes (Signed)
.     Advanced Heart Failure Rounding Note  Referring Physician: Dr Hulen Skains Primary Physician: Maurice Small, MD Primary Cardiologist: Dr Marlou Porch    Subjective:   Yesterday she was diuresed with IV lasix. Feeling much better. Denies SOB.    Objective:   Weight Range: 245 lb 2.4 oz (111.2 kg) Body mass index is 39.59 kg/(m^2).   Vital Signs:   Temp:  [98 F (36.7 C)-98.7 F (37.1 C)] 98.7 F (37.1 C) (05/16 2113) Pulse Rate:  [94-103] 103 (05/16 2113) Resp:  [18-20] 20 (05/16 2113) BP: (123-125)/(59-64) 125/59 mmHg (05/16 2113) SpO2:  [93 %-95 %] 95 % (05/16 2113) Weight:  [245 lb 2.4 oz (111.2 kg)] 245 lb 2.4 oz (111.2 kg) (05/17 0500) Last BM Date: 11/01/15  Weight change: Filed Weights   10/31/15 0634 11/01/15 0538 11/02/15 0500  Weight: 245 lb 6 oz (111.3 kg) 240 lb 8.4 oz (109.1 kg) 245 lb 2.4 oz (111.2 kg)    Intake/Output:   Intake/Output Summary (Last 24 hours) at 11/02/15 1159 Last data filed at 11/02/15 0800  Gross per 24 hour  Intake    480 ml  Output   2400 ml  Net  -1920 ml     Physical Exam: General: Lying in bed. NAD.  HEENT: normal Neck: supple. JVP 6-7 . Carotids 2+ bilat; no bruits. No thyromegaly or lymphadenopathy noted.  Cor: PMI nondisplaced. RRR, no M/G/R Lungs: Decreased in the bases. On 2 liters [place].  Abdomen: obese soft, nondistended. Large flank hematoma. No HSM. No bruits or masses. Good bowel sounds. Extremities: no cyanosis, clubbing, rash, Trace edema Neuro: alert & orientedx3, cranial nerves grossly intact. moves all 4 extremities w/o difficulty. Affect pleasant  Telemetry: Reviewed, NSR  Labs: CBC  Recent Labs  10/31/15 0629  WBC 9.5  HGB 7.8*  HCT 24.7*  MCV 93.9  PLT [id number]   Basic Metabolic Panel  Recent Labs  11/02/15 0736  NA 140  K 3.4*  CL 94*  CO2 32  GLUCOSE 124*  BUN 16  CREATININE 0.90  CALCIUM 9.1   Liver Function Tests No results for input(s): AST, ALT, ALKPHOS, BILITOT, PROT, ALBUMIN in the  last 72 hours. No results for input(s): LIPASE, AMYLASE in the last 72 hours. Cardiac Enzymes No results for input(s): CKTOTAL, CKMB, CKMBINDEX, TROPONINI in the last 72 hours.  BNP: BNP (last 3 results)  Recent Labs  11/02/15 0736  BNP 143.9*    ProBNP (last 3 results) No results for input(s): PROBNP in the last 8760 hours.   D-Dimer No results for input(s): DDIMER in the last 72 hours. Hemoglobin A1C No results for input(s): HGBA1C in the last 72 hours. Fasting Lipid Panel No results for input(s): CHOL, HDL, LDLCALC, TRIG, CHOLHDL, LDLDIRECT in the last 72 hours. Thyroid Function Tests No results for input(s): TSH, T4TOTAL, T3FREE, THYROIDAB in the last 72 hours.  Invalid input(s): FREET3  Other results:     Imaging/Studies:  Dg Chest Port 1 View  11/01/2015  CLINICAL DATA:  Dyspnea this morning.  No chest pain. EXAM: PORTABLE CHEST 1 VIEW COMPARISON:  10/24/2015 FINDINGS: Cardiac silhouette is mildly enlarged. No mediastinal or hilar masses or evidence of adenopathy. Left anterior chest wall biventricular cardioverter-defibrillator is stable. Lungs are clear.  No pleural effusion or pneumothorax. IMPRESSION: No acute cardiopulmonary disease. Stable appearance from the recent prior exam. Electronically Signed   By: Lajean Manes M.D.   On: 11/01/2015 11:03    Latest Echo  Latest Cath  Medications:     Scheduled Medications: . allopurinol  400 mg Oral Daily  . cholecalciferol  1,000 Units Oral Daily  . colestipol  2 g Oral Daily  . docusate sodium  100 mg Oral BID  . ferrous gluconate  324 mg Oral BID WC  . furosemide  20 mg Oral Daily  . gabapentin  800 mg Oral TID  . gemfibrozil  600 mg Oral BID AC  . heparin subcutaneous  5,000 Units Subcutaneous Q8H  . multivitamin with minerals  1 tablet Oral Daily  . nortriptyline  25 mg Oral QHS  . pravastatin  80 mg Oral QPM    Infusions: . sodium chloride      PRN Medications: sodium chloride,  acetaminophen, nitroGLYCERIN, ondansetron **OR** ondansetron (ZOFRAN) IV, oxyCODONE, polyethylene glycol   Assessment   1. Fall with large R flank hematoma 2. NICM EF 35% s/p BiV ICD - [employer]. -> EF 50-55% Grade I DD on echo 5/10 3. AF  4. Morbid obesity with h/o CO2 retention  5. Acute blood loss anemia 6. Hypokalemia  Plan  Volume much improved. Increase po lasix 40 mg daily. Supplement K. Needs daily weight at SNF.   I will set up follow up in the HF clinic.    Length of Stay: [place] NP-C  11/02/2015, 11:59 AM  Advanced Heart Failure Team Pager [phone] (M-F; 7a - 4p)  Please contact [facility] for night-coverage after hours (4p -7a ) and weekends on [email or web address]   Patient seen and examined with Darrick Grinder, NP. We discussed all aspects of the encounter. I agree with the assessment and plan as stated above.   Volume status improved. Back on po lasix. Supp K+ We will sign off. F/u arranged in HF clinic. Will need to decide on restarting anticoag as an outpatient.   Bensimhon, Daniel,MD 11:01 PM

## 2015-11-02 NOTE — Progress Notes (Signed)
Physical Therapy Treatment Patient Details Name: Bianca [name] MRN: [id number] DOB: Feb 05, 1933 Today's Date: 11/02/2015    History of Present Illness Pt admitted on 10/23/15 s/p fall with large Rt flank hematoma    PT Comments    Patient continues to make gradual progress toward mobility goals. Current plan remains appropriate.   Follow Up Recommendations  SNF;Supervision/Assistance - 24 hour     Equipment Recommendations  Rolling walker with 5" wheels    Recommendations for Other Services       Precautions / Restrictions Precautions Precautions: Fall Precaution Comments: watch O2 saturations Restrictions Weight Bearing Restrictions: No    Mobility  Bed Mobility Overal bed mobility: Needs Assistance Bed Mobility: Supine to Sit     Supine to sit: Min guard     General bed mobility comments: min guard for safety; increased time needed and use of bedrail  Transfers Overall transfer level: Needs assistance Equipment used: Rolling walker (2 wheeled) Transfers: Sit to/from Stand Sit to Stand: Mod assist         General transfer comment: cues for safe hand placement and technique; pt used momentum and needed increased time to achieve upright posture and hand transition   Ambulation/Gait Ambulation/Gait assistance: Min guard Ambulation Distance (Feet): 90 Feet (75,15) Assistive device: Rolling walker (2 wheeled) Gait Pattern/deviations: Step-through pattern;Decreased stride length;Trunk flexed;Drifts right/left;Wide base of support (drifts R) Gait velocity: decreased   General Gait Details: cues to keep RW to L with pt tending to drift to R side, for posture, and increased step length/height; SpO2 88-94% with ambulation on 2L O2 via nasal canula; pt  educated in pursed lip breathing technique; pt ambulated in room on RA with SpO2 decreased to 84%; O2 applied and pt asymptomatic   Stairs            Wheelchair Mobility    Modified Rankin (Stroke Patients  Only)       Balance     Sitting balance-Leahy Scale: Fair       Standing balance-Leahy Scale: Poor                      Cognition Arousal/Alertness: Awake/alert Behavior During Therapy: WFL for tasks assessed/performed Overall Cognitive Status: Within Functional Limits for tasks assessed                      Exercises      General Comments General comments (skin integrity, edema, etc.): daughter in law present for session      Pertinent Vitals/Pain Pain Assessment: Faces Faces Pain Scale: Hurts a little bit Pain Location: back Pain Descriptors / Indicators: Sore Pain Intervention(s): Monitored during session;Premedicated before session    Home Living                      Prior Function            PT Goals (current goals can now be found in the care plan section) Acute Rehab PT Goals Patient Stated Goal: to get better and move in to her new community Progress towards PT goals: Progressing toward goals    Frequency  Min 2X/week    PT Plan Current plan remains appropriate    Co-evaluation             End of Session Equipment Utilized During Treatment: Gait belt;Oxygen Activity Tolerance: Patient limited by fatigue Patient left: in chair;with call bell/phone within reach;with chair alarm set  Time: [id number] PT Time Calculation (min) (ACUTE ONLY): 45 min  Charges:  $Gait Training: 8-22 mins $Therapeutic Activity: 23-37 mins                    G Codes:      [place] April, PTA Pager: [phone]   11/02/2015, 4:40 PM

## 2015-11-02 NOTE — Care Management Important Message (Signed)
Important Message  Patient Details  Name: GABRYELLE HELMLE MRN: [id number] Date of Birth: 10-18-1932   Medicare Important Message Given:  Yes    Reinaldo Raddle, RN, BSN  Trauma/Neuro ICU Case Manager [phone]

## 2015-11-02 NOTE — Progress Notes (Signed)
Patient ID: Bianca [name], female   DOB: [birth year], 82 y.o.   MRN: [id number]   LOS: 8 days   Subjective: Her side is hurting more today.   Objective: Vital signs in last 24 hours: Temp:  [98 F (36.7 C)-98.7 F (37.1 C)] 98.7 F (37.1 C) (05/16 2113) Pulse Rate:  [94-103] 103 (05/16 2113) Resp:  [18-20] 20 (05/16 2113) BP: (123-125)/(59-64) 125/59 mmHg (05/16 2113) SpO2:  [93 %-95 %] 95 % (05/16 2113) Weight:  [111.2 kg (245 lb 2.4 oz)] 111.2 kg (245 lb 2.4 oz) (05/17 0500) Last BM Date: 11/01/15   Laboratory  BMET  Recent Labs  11/02/15 0736  NA 140  K 3.4*  CL 94*  CO2 32  GLUCOSE 124*  BUN 16  CREATININE 0.90  CALCIUM 9.1    Physical Exam General appearance: alert and no distress Resp: clear to auscultation bilaterally Cardio: regular rate and rhythm GI: Soft, +BS, hematoma firm but not hot   Assessment/Plan: Fall R flank hematoma  ABL anemia - Stable CHF - cards actively managing her CHF, discharge depending on them FEN - No issues VTE - SCD's Dispo - SNF when cards clears    Lisette Abu, PA-C Pager: [phone] General Trauma PA Pager: [phone]  11/02/2015

## 2015-11-03 DIAGNOSIS — Z5189 Encounter for other specified aftercare: Secondary | ICD-10-CM | POA: Diagnosis not present

## 2015-11-03 DIAGNOSIS — R262 Difficulty in walking, not elsewhere classified: Secondary | ICD-10-CM | POA: Diagnosis not present

## 2015-11-03 DIAGNOSIS — N390 Urinary tract infection, site not specified: Secondary | ICD-10-CM | POA: Diagnosis not present

## 2015-11-03 DIAGNOSIS — R0602 Shortness of breath: Secondary | ICD-10-CM | POA: Diagnosis not present

## 2015-11-03 DIAGNOSIS — S301XXA Contusion of abdominal wall, initial encounter: Secondary | ICD-10-CM | POA: Diagnosis not present

## 2015-11-03 DIAGNOSIS — D62 Acute posthemorrhagic anemia: Secondary | ICD-10-CM | POA: Diagnosis not present

## 2015-11-03 DIAGNOSIS — M6281 Muscle weakness (generalized): Secondary | ICD-10-CM | POA: Diagnosis not present

## 2015-11-03 DIAGNOSIS — I480 Paroxysmal atrial fibrillation: Secondary | ICD-10-CM | POA: Diagnosis not present

## 2015-11-03 DIAGNOSIS — I5043 Acute on chronic combined systolic (congestive) and diastolic (congestive) heart failure: Secondary | ICD-10-CM | POA: Diagnosis not present

## 2015-11-03 DIAGNOSIS — R2689 Other abnormalities of gait and mobility: Secondary | ICD-10-CM | POA: Diagnosis not present

## 2015-11-03 DIAGNOSIS — G5793 Unspecified mononeuropathy of bilateral lower limbs: Secondary | ICD-10-CM | POA: Diagnosis not present

## 2015-11-03 DIAGNOSIS — R259 Unspecified abnormal involuntary movements: Secondary | ICD-10-CM | POA: Diagnosis not present

## 2015-11-03 MED ORDER — POTASSIUM CHLORIDE ER 10 MEQ PO TBCR: 10.0000 meq | EXTENDED_RELEASE_TABLET | Freq: Two times a day (BID) | ORAL | Status: DC

## 2015-11-03 MED ORDER — FUROSEMIDE 40 MG PO TABS: 40.0000 mg | ORAL_TABLET | Freq: Every day | ORAL | Status: DC

## 2015-11-03 MED ORDER — OXYCODONE-ACETAMINOPHEN 5-325 MG PO TABS: 1.0000 | ORAL_TABLET | ORAL | Status: DC | PRN

## 2015-11-03 NOTE — Progress Notes (Signed)
Pt discharged to [facility].  Report called and given to RN and family member informed of pt leaving.  Iv removed.  Pt transported via [facility].

## 2015-11-03 NOTE — Progress Notes (Signed)
Chaplain presented to the patient to provide spiritual care support.  She reports she is scheduled for discharge today for rehab to assist her getting mobil again since her fall. Chaplain offered encouragement and a prayer of healing and wholeness.  She was appreciative of the visit. Chaplain Yaakov Guthrie [phone]

## 2015-11-03 NOTE — Clinical Social Work Placement (Signed)
   CLINICAL SOCIAL WORK PLACEMENT  NOTE  Date:  11/03/2015  Patient Details  Name: Bianca [name] MRN: [id number] Date of Birth: 12-22-1932  Clinical Social Work is seeking post-discharge placement for this patient at the Lead level of care (*CSW will initial, date and re-position this form in  chart as items are completed):  Yes   Patient/family provided with [place] Work Department's list of facilities offering this level of care within the geographic area requested by the patient (or if unable, by the patient's family).  Yes   Patient/family informed of their freedom to choose among providers that offer the needed level of care, that participate in Medicare, Medicaid or managed care program needed by the patient, have an available bed and are willing to accept the patient.  Yes   Patient/family informed of [facility]'s ownership interest in [facility] and [facility], as well as of the fact that they are under no obligation to receive care at these facilities.  PASRR submitted to EDS on 10/28/15     PASRR number received on       Existing PASRR number confirmed on       FL2 transmitted to all facilities in geographic area requested by pt/family on 10/28/15     FL2 transmitted to all facilities within larger geographic area on       Patient informed that his/her managed care company has contracts with or will negotiate with certain facilities, including the following:        Yes   Patient/family informed of bed offers received.  Patient chooses bed at  [facility] )     Physician recommends and patient chooses bed at      Patient to be transferred to  [name]) on 11/03/15.  Patient to be transferred to facility by  [name] )     Patient family notified on 11/03/15 of transfer.  Name of family member notified:   (Pt's son, [name] )     PHYSICIAN Please sign FL2, Please prepare  priority discharge summary, including medications     Additional Comment:    _______________________________________________ Rozell Searing, LCSW 11/03/2015, 10:31 AM

## 2015-11-03 NOTE — Clinical Social Work Note (Signed)
Admissions paperwork completed by son, [name].  Clinical Social Worker facilitated patient discharge including contacting patient family and facility to confirm patient discharge plans.  Clinical information faxed to facility and family agreeable with plan.  CSW arranged ambulance transport via [facility] at 12PM to [facility].  RN to call report prior to discharge [phone].  Clinical Social Worker will sign off for now as social work intervention is no longer needed. Please consult [place] again if new need arises.  Glendon Axe, MSW, LCSWA [phone] 11/03/2015 10:31 AM

## 2015-11-03 NOTE — Care Management Note (Signed)
Case Management Note  Patient Details  Name: Bianca [name] MRN: [id number] Date of Birth: [birth year]  Subjective/Objective:      Pt medically stable for dc today.                Action/Plan: Pt for dc to SNF today, per CSW arrangements.    Expected Discharge Date:     11/03/2015             Expected Discharge Plan:  Skilled Nursing Facility  In-House Referral:  Clinical Social Work  Discharge planning Services  CM Consult  Post Acute Care Choice:    Choice offered to:     DME Arranged:    DME Agency:     HH Arranged:    [place] Agency:     Status of Service:  Completed, signed off  Medicare Important Message Given:  Yes Date Medicare IM Given:    Medicare IM give by:    Date Additional Medicare IM Given:    Additional Medicare Important Message give by:     If discussed at [place] of Stay Meetings, dates discussed:    Additional Comments:  Reinaldo Raddle, RN, BSN  Trauma/Neuro ICU Case Manager [phone]

## 2015-11-03 NOTE — Progress Notes (Signed)
Patient ID: Bianca [name], female   DOB: 1933/03/16, 82 y.o.   MRN: [id number]   LOS: 9 days   Subjective: HA again this morning but otherwise no change. Ready to go to SNF.   Objective: Vital signs in last 24 hours: Temp:  [97.7 F (36.5 C)-99.1 F (37.3 C)] 97.7 F (36.5 C) (05/18 0450) Pulse Rate:  [89-97] 96 (05/18 0450) Resp:  [16-20] 19 (05/18 0450) BP: (115-130)/(51-62) 128/58 mmHg (05/18 0450) SpO2:  [93 %-94 %] 93 % (05/18 0450) Weight:  [112.856 kg (248 lb 12.8 oz)] 112.856 kg (248 lb 12.8 oz) (05/18 0450) Last BM Date: 11/01/15   Physical Exam General appearance: alert and no distress Resp: clear to auscultation bilaterally Cardio: regular rate and rhythm GI: normal findings: bowel sounds normal and soft, non-tender   Assessment/Plan: Fall R flank hematoma  ABL anemia - Stable CHF - Stable FEN - Recommended see PCP if HA's don't resolve by the time she leaves SNF VTE - SCD's Dispo - D/C to SNF    Lisette Abu, PA-C Pager: [phone] General Trauma PA Pager: [phone]  11/03/2015

## 2015-11-03 NOTE — Discharge Summary (Signed)
Physician Discharge Summary  Patient ID: Bianca [name] MRN: [id number] DOB/AGE: 1933/02/04 82 y.o.  Admit date: 10/24/2015 Discharge date: 11/03/2015  Discharge Diagnoses Patient Active Problem List   Diagnosis Date Noted  . Neuropathy involving both lower extremities ([place])   . Paroxysmal atrial fibrillation ([facility])   . OSA (obstructive sleep apnea)   . Acute on chronic combined systolic and diastolic heart failure ([place])   . E. coli UTI   . Acute blood loss anemia   . Subcutaneous hematoma   . Acute on chronic diastolic (congestive) heart failure ([place])   . Fall 10/25/2015  . Afib ([place]) 01/19/2015  . Long-term (current) use of anticoagulants 07/20/2014  . Persistent atrial fibrillation ([place]) 07/20/2014  . Encounter for therapeutic drug monitoring 07/16/2013  . Chronic systolic heart failure ([place]) 07/16/2013  . History of implantable cardioverter-defibrillator (ICD) placement 07/16/2013  . Morbid obesity ([place]) 07/16/2013  . Chronic anticoagulation 07/16/2013  . Knee osteoarthritis 07/16/2013  . Acute systolic heart failure ([place])   . HYPOKALEMIA 08/09/2010  . PERIPHERAL NEUROPATHY 08/09/2010  . CHF 08/09/2010  . Automatic implantable cardioverter-defibrillator in situ 08/09/2010    Consultants Dr. Glori Bickers for cardiology  Dr. Delice Lesch for PM&R   Procedures None   HPI: Bianca [name] was moving books onto a bar when she lost her balance and fell, striking the bar.There was no loss of consciousness.She had a hematoma that they could tell was enlarging so they brought her to the ED.Her workup included CT scans of the head, cervical spine, chest, abdomen, and pelvis which showed the above-mentioned injuries. Given her anticoagulation with coumadin she was was reversed with vitamin K and FFP and was also given packed red blood cells. She was admitted to the trauma service and cardiology was consulted.   Hospital Course: Cardiology helped to manage her heart failure while  she was hospitalized. She only had a mild exacerbation during her stay. She was maintained at bed rest until her hemoglobin stabilized. She was then progressively ambulated with physical and occupational therapies who recommended skilled nursing facility placement. One of the trauma surgeons thought she might be good enough to go to inpatient rehabilitation and they were consulted but felt that she was more appropriate for the skilled nursing facility level. Once a bed was found and her mild heart failure exacerbation was managed she was discharged there in good condition.     Medication List    STOP taking these medications        carvedilol 12.5 MG tablet  Commonly known as:  COREG     losartan 25 MG tablet  Commonly known as:  COZAAR     spironolactone 25 MG tablet  Commonly known as:  ALDACTONE     warfarin 5 MG tablet  Commonly known as:  COUMADIN      TAKE these medications        allopurinol 300 MG tablet  Commonly known as:  ZYLOPRIM  Take 300 mg by mouth daily. Takes along with a 100mg  tablet to equal 400mg      allopurinol 100 MG tablet  Commonly known as:  ZYLOPRIM  Take 100 mg by mouth daily. Takes along with a 300 mg tablet to equal 400 mg     CALCIUM 600+D 600-400 MG-UNIT tablet  Generic drug:  Calcium Carbonate-Vitamin D  Take 1 tablet by mouth 2 (two) times daily.     colestipol 1 g tablet  Commonly known as:  COLESTID  Take 2 g by mouth daily.  furosemide 40 MG tablet  Commonly known as:  LASIX  Take 1 tablet (40 mg total) by mouth daily.     gabapentin 800 MG tablet  Commonly known as:  NEURONTIN  Take 800 mg by mouth 3 (three) times daily.     gemfibrozil 600 MG tablet  Commonly known as:  LOPID  Take 600 mg by mouth 2 (two) times daily before a meal.     multivitamin tablet  Take 1 tablet by mouth daily.     nitroGLYCERIN 0.4 MG SL tablet  Commonly known as:  NITROSTAT  Place 1 tablet (0.4 mg total) under the tongue every 5 (five) minutes  as needed for chest pain.     nortriptyline 25 MG capsule  Commonly known as:  PAMELOR  Take 25 mg by mouth at bedtime.     oxyCODONE-acetaminophen 5-325 MG tablet  Commonly known as:  ROXICET  Take 1-2 tablets by mouth every 4 (four) hours as needed (Pain).     potassium chloride 10 MEQ tablet  Commonly known as:  K-DUR  Take 1 tablet (10 mEq total) by mouth 2 (two) times daily.     pravastatin 80 MG tablet  Commonly known as:  PRAVACHOL  Take 1 tablet (80 mg total) by mouth every evening.     Vitamin D3 1000 units Caps  Take 1 capsule by mouth daily.            Follow-up Information    Follow up with Glori Bickers, MD On 11/15/2015.   Specialty:  Cardiology   Why:  at 10:30    Contact information:   [place] [address] [phone]       Schedule an appointment as soon as possible for a visit with Jonathon Bellows, MD.   Specialty:  Family Medicine   Contact information:   [address] [place] [address] [phone]       Call [place].   Why:  As needed   Contact information:   [address] [id number] [place] [phone]       Signed: Lisette Abu, PA-C Pager: [phone] General Trauma PA Pager: [phone] 11/03/2015, 7:37 AM

## 2015-11-07 ENCOUNTER — Non-Acute Institutional Stay (SKILLED_NURSING_FACILITY): Payer: PPO | Admitting: Adult Health

## 2015-11-07 ENCOUNTER — Encounter: Payer: Self-pay | Admitting: Adult Health

## 2015-11-07 DIAGNOSIS — E876 Hypokalemia: Secondary | ICD-10-CM

## 2015-11-07 DIAGNOSIS — I5042 Chronic combined systolic (congestive) and diastolic (congestive) heart failure: Secondary | ICD-10-CM

## 2015-11-07 DIAGNOSIS — F32A Depression, unspecified: Secondary | ICD-10-CM

## 2015-11-07 DIAGNOSIS — R5381 Other malaise: Secondary | ICD-10-CM | POA: Diagnosis not present

## 2015-11-07 DIAGNOSIS — D62 Acute posthemorrhagic anemia: Secondary | ICD-10-CM | POA: Diagnosis not present

## 2015-11-07 DIAGNOSIS — I480 Paroxysmal atrial fibrillation: Secondary | ICD-10-CM

## 2015-11-07 DIAGNOSIS — F329 Major depressive disorder, single episode, unspecified: Secondary | ICD-10-CM

## 2015-11-07 DIAGNOSIS — M109 Gout, unspecified: Secondary | ICD-10-CM | POA: Diagnosis not present

## 2015-11-07 DIAGNOSIS — G629 Polyneuropathy, unspecified: Secondary | ICD-10-CM | POA: Diagnosis not present

## 2015-11-07 DIAGNOSIS — T148 Other injury of unspecified body region: Secondary | ICD-10-CM

## 2015-11-07 DIAGNOSIS — E785 Hyperlipidemia, unspecified: Secondary | ICD-10-CM | POA: Diagnosis not present

## 2015-11-07 DIAGNOSIS — T148XXA Other injury of unspecified body region, initial encounter: Secondary | ICD-10-CM

## 2015-11-07 NOTE — Progress Notes (Signed)
Patient ID: Bianca [name], female   DOB: 1932/07/11, 82 y.o.   MRN: [id number]    DATE:   11/07/15  MRN:  [id number]  BIRTHDAY: 28-Jan-1933  Facility:  Nursing Home Location:  [place] Room Number: 103-P  LEVEL OF CARE:  SNF [phone])  Contact Information    Name Relation Home Work [place] Son [phone]  [phone]   Cyntha, [place] Relative [phone]  [phone]       Code Status History    Date Active Date Inactive Code Status Order ID Comments User Context   10/25/2015  6:57 AM 11/03/2015  6:29 PM Full Code [id number]  Stark Klein, MD Inpatient       Chief Complaint  Patient presents with  . Hospitalization Follow-up    HISTORY OF PRESENT ILLNESS:  This is an 82 year old female who has been admitted to [facility] on 11/03/15 from [facility]. She lost her balance and fell striking the bar. CT scans of the head, cervical spine, chest, abdomen and pelvis. She sustained a right flank hematoma. Given her anticoagulation with Coumadin, she was reversed with vitamin K and 4 u FFP and was also given packed  2 u red blood cells. Cardiology was consulted due to a mild exacerbation during her stay in the hospital. She was maintained at bedrest until her hemoglobin stabbing lies. She was then progressively ambulated with physical and occupational therapies.  She has been admitted for a short-term rehabilitation.   PAST MEDICAL HISTORY:  Past Medical History  Diagnosis Date  . Pacemaker   . ICD (implantable cardiac defibrillator) in place   . Myocardial infarction ([place]) 2008  . Sleep apnea   . Arthritis   . Neuromuscular disorder ([place])   . CHF (congestive heart failure) ([place])   . Hypertension   . Dysrhythmia   . Chronic systolic heart failure ([place])   . Acute systolic heart failure ([place])   . Acute blood loss anemia      CURRENT MEDICATIONS: Reviewed  Patient's Medications  New Prescriptions   No medications  on file  Previous Medications   ALLOPURINOL (ZYLOPRIM) 100 MG TABLET    Take 100 mg by mouth daily. Takes along with a 300 mg tablet to equal 400 mg   ALLOPURINOL (ZYLOPRIM) 300 MG TABLET    Take 300 mg by mouth daily. Takes along with a 100mg  tablet to equal 400mg    CALCIUM CARBONATE-VITAMIN D (CALCIUM 600+D) 600-400 MG-UNIT PER TABLET    Take 1 tablet by mouth 2 (two) times daily.   CHOLECALCIFEROL (VITAMIN D3) 1000 UNITS CAPS    Take 1 capsule by mouth daily.   COLESTIPOL (COLESTID) 1 G TABLET    Take 2 g by mouth daily.    FUROSEMIDE (LASIX) 40 MG TABLET    Take 1 tablet (40 mg total) by mouth daily.   GABAPENTIN (NEURONTIN) 800 MG TABLET    Take 800 mg by mouth 3 (three) times daily.    GEMFIBROZIL (LOPID) 600 MG TABLET    Take 600 mg by mouth 2 (two) times daily before a meal.   MULTIPLE VITAMIN (MULTIVITAMIN) TABLET    Take 1 tablet by mouth daily.   NITROGLYCERIN (NITROSTAT) 0.4 MG SL TABLET    Place 1 tablet (0.4 mg total) under the tongue every 5 (five) minutes as needed for chest pain.   NORTRIPTYLINE (PAMELOR) 25 MG CAPSULE    Take 25 mg by mouth at bedtime.   OXYCODONE-ACETAMINOPHEN (  ROXICET) 5-325 MG TABLET    Take 1-2 tablets by mouth every 4 (four) hours as needed (Pain).   OXYGEN    Inhale 2 L/min into the lungs as needed.   POTASSIUM CHLORIDE (K-DUR) 10 MEQ TABLET    Take 1 tablet (10 mEq total) by mouth 2 (two) times daily.   PRAVASTATIN (PRAVACHOL) 80 MG TABLET    Take 1 tablet (80 mg total) by mouth every evening.  Modified Medications   No medications on file  Discontinued Medications   No medications on file     Allergies  Allergen Reactions  . Quinapril Hcl Swelling    Tongue and throat  . Tape     Plastic tape causes irritation.   . Neosporin [Neomycin-Polymyxin-Gramicidin] Rash     REVIEW OF SYSTEMS:  GENERAL: no change in appetite, no fatigue, no weight changes, no fever, chills or weakness EYES: Denies change in vision, dry eyes, eye pain, itching or  discharge EARS: Denies change in hearing, ringing in ears, or earache NOSE: Denies nasal congestion or epistaxis MOUTH and THROAT: Denies oral discomfort, gingival pain or bleeding, pain from teeth or hoarseness   RESPIRATORY: no cough, SOB, DOE, wheezing, hemoptysis CARDIAC: no chest pain,  or palpitations GI: no abdominal pain, diarrhea, constipation, heart burn, nausea or vomiting GU: Denies dysuria, frequency, hematuria, incontinence, or discharge PSYCHIATRIC: Denies feeling of depression or anxiety. No report of hallucinations, insomnia, paranoia, or agitation   PHYSICAL EXAMINATION  GENERAL APPEARANCE: Well nourished. In no acute distress. Morbidly obese SKIN:  Skin is warm and dry. Right flank and lower back hematoma noted HEAD: Normal in size and contour. No evidence of trauma EYES: Lids open and close normally. No blepharitis, entropion or ectropion. PERRL. Conjunctivae are clear and sclerae are white. Lenses are without opacity EARS: Pinnae are normal. Patient hears normal voice tunes of the examiner MOUTH and THROAT: Lips are without lesions. Oral mucosa is moist and without lesions. Tongue is normal in shape, size, and color and without lesions NECK: supple, trachea midline, no neck masses, no thyroid tenderness, no thyromegaly LYMPHATICS: no LAN in the neck, no supraclavicular LAN RESPIRATORY: breathing is even & unlabored, BS CTAB, O2 @ 2L/min via [place] CARDIAC: RRR, no murmur,no extra heart sounds, BLE trace edema GI: abdomen soft, normal BS, no masses, no tenderness, no hepatomegaly, no splenomegaly EXTREMITIES:  Able to move X 4 extremities PSYCHIATRIC: Alert and oriented X 3. Affect and behavior are appropriate  LABS/RADIOLOGY: Labs reviewed: Basic Metabolic Panel:  Recent Labs  10/28/15 0328 10/29/15 0418 11/02/15 0736  NA 142 141 140  K 3.9 3.8 3.4*  CL 97* 95* 94*  CO2 34* 34* 32  GLUCOSE 131* 120* 124*  BUN 17 19 16   CREATININE 1.10* 1.00 0.90  CALCIUM  8.9 9.0 9.1   Liver Function Tests:  Recent Labs  10/25/15 0050 10/26/15 0655  AST 20 19  ALT 13* 13*  ALKPHOS 42 46  BILITOT 0.6 0.9  PROT 6.2* 5.7*  ALBUMIN 3.5 3.2*   CBC:  Recent Labs  05/27/15 0855  10/26/15 1626 10/27/15 0325  10/29/15 0841 10/30/15 0633 10/31/15 0629  WBC 5.9  < > 11.7* 11.5*  < > 9.0 9.4 9.5  NEUTROABS 2.7  --  7.9* 7.1  --   --   --   --   HGB 12.4  < > 7.5* 7.3*  < > 7.7* 7.4* 7.8*  HCT 39.4  < > 22.6* 22.8*  < > 25.7* 24.6* 24.7*  MCV 98.0  < > 89.0 89.4  < > 92.8 93.5 93.9  PLT 228  < > 145* 166  < > 271 308 331  < > = values in this interval not displayed.   Ct Head Wo Contrast  10/25/2015  CLINICAL DATA:  Fall on Coumadin. EXAM: CT HEAD WITHOUT CONTRAST CT CERVICAL SPINE WITHOUT CONTRAST TECHNIQUE: Multidetector CT imaging of the head and cervical spine was performed following the standard protocol without intravenous contrast. Multiplanar CT image reconstructions of the cervical spine were also generated. COMPARISON:  05/27/2015 FINDINGS: CT HEAD FINDINGS There is no intracranial hemorrhage, mass or evidence of acute infarction. There is moderate generalized atrophy. There is moderate chronic microvascular ischemic change. There is no significant extra-axial fluid collection. No acute intracranial findings are evident. The calvarium and skullbase are intact. The visible paranasal sinuses and orbits are unremarkable. CT CERVICAL SPINE FINDINGS The vertebral column, pedicles and facet articulations are intact. There is no evidence of acute fracture. No acute soft tissue abnormalities are evident. Mild arthritic changes are present. No bone lesion or bony destruction. IMPRESSION: 1. No acute intracranial findings. There is moderate generalized atrophy and chronic appearing white matter hypodensities which likely represent small vessel ischemic disease. 2. Negative for acute cervical spine fracture Electronically Signed   By: Andreas Newport M.D.   On:  10/25/2015 01:18   Ct Chest W Contrast  10/25/2015  CLINICAL DATA:  [place].  Anti coagulated. EXAM: CT CHEST, ABDOMEN, AND PELVIS WITH CONTRAST TECHNIQUE: Multidetector CT imaging of the chest, abdomen and pelvis was performed following the standard protocol during bolus administration of intravenous contrast. CONTRAST:  100 mL Isovue-300 intravenous COMPARISON:  None. FINDINGS: CT CHEST The lungs are clear except for minor chronic appearing posterior base linear opacities which likely represent scarring. There is no pneumothorax. There is no effusion. The airways are patent and intact. No intrathoracic vascular injury. Mediastinum and hila appear unremarkable. No displaced fracture. CT ABDOMEN AND PELVIS There are normal intact appearances of the liver, spleen, pancreas, adrenals and kidneys. Mild diffuse fatty infiltration of the liver without focal lesion. Prior cholecystectomy. Bowel is unremarkable. There is no peritoneal blood or free air. The abdominal aorta is normal in caliber and intact. Prior hysterectomy.  No adnexal abnormalities. No acute fracture is evident. There is a hematoma of the subcutaneous tissues in the right flank region, measuring approximately 8 by 14 x 17 cm. There is a moderate volume vascular contrast extravasated into the hematoma, indicating active hemorrhage at the time of the scan. This hematoma is entirely superficial to the abdominal wall musculature. No significant intramuscular hemorrhage. There is no hemorrhage into the peritoneum or retroperitoneum. IMPRESSION: 1. Active hemorrhage into a large subcutaneous hematoma in the right flank region. The hematoma is confined to the subcutaneous tissues. No significant intramuscular component. No hemorrhage into the peritoneum or retroperitoneum. 2. No acute traumatic injury within the chest. No acute findings within the abdomen or pelvis. 3. Fatty liver. 4. These results were called by telephone at the time of interpretation on  10/25/2015 at 1:28 am to Dr. Everlene Balls , who verbally acknowledged these results. Electronically Signed   By: Andreas Newport M.D.   On: 10/25/2015 01:28   Ct Cervical Spine Wo Contrast  10/25/2015  CLINICAL DATA:  Fall on Coumadin. EXAM: CT HEAD WITHOUT CONTRAST CT CERVICAL SPINE WITHOUT CONTRAST TECHNIQUE: Multidetector CT imaging of the head and cervical spine was performed following the standard protocol without intravenous contrast. Multiplanar CT image  reconstructions of the cervical spine were also generated. COMPARISON:  05/27/2015 FINDINGS: CT HEAD FINDINGS There is no intracranial hemorrhage, mass or evidence of acute infarction. There is moderate generalized atrophy. There is moderate chronic microvascular ischemic change. There is no significant extra-axial fluid collection. No acute intracranial findings are evident. The calvarium and skullbase are intact. The visible paranasal sinuses and orbits are unremarkable. CT CERVICAL SPINE FINDINGS The vertebral column, pedicles and facet articulations are intact. There is no evidence of acute fracture. No acute soft tissue abnormalities are evident. Mild arthritic changes are present. No bone lesion or bony destruction. IMPRESSION: 1. No acute intracranial findings. There is moderate generalized atrophy and chronic appearing white matter hypodensities which likely represent small vessel ischemic disease. 2. Negative for acute cervical spine fracture Electronically Signed   By: Andreas Newport M.D.   On: 10/25/2015 01:18   Ct Abdomen Pelvis W Contrast  10/25/2015  CLINICAL DATA:  [place].  Anti coagulated. EXAM: CT CHEST, ABDOMEN, AND PELVIS WITH CONTRAST TECHNIQUE: Multidetector CT imaging of the chest, abdomen and pelvis was performed following the standard protocol during bolus administration of intravenous contrast. CONTRAST:  100 mL Isovue-300 intravenous COMPARISON:  None. FINDINGS: CT CHEST The lungs are clear except for minor chronic appearing  posterior base linear opacities which likely represent scarring. There is no pneumothorax. There is no effusion. The airways are patent and intact. No intrathoracic vascular injury. Mediastinum and hila appear unremarkable. No displaced fracture. CT ABDOMEN AND PELVIS There are normal intact appearances of the liver, spleen, pancreas, adrenals and kidneys. Mild diffuse fatty infiltration of the liver without focal lesion. Prior cholecystectomy. Bowel is unremarkable. There is no peritoneal blood or free air. The abdominal aorta is normal in caliber and intact. Prior hysterectomy.  No adnexal abnormalities. No acute fracture is evident. There is a hematoma of the subcutaneous tissues in the right flank region, measuring approximately 8 by 14 x 17 cm. There is a moderate volume vascular contrast extravasated into the hematoma, indicating active hemorrhage at the time of the scan. This hematoma is entirely superficial to the abdominal wall musculature. No significant intramuscular hemorrhage. There is no hemorrhage into the peritoneum or retroperitoneum. IMPRESSION: 1. Active hemorrhage into a large subcutaneous hematoma in the right flank region. The hematoma is confined to the subcutaneous tissues. No significant intramuscular component. No hemorrhage into the peritoneum or retroperitoneum. 2. No acute traumatic injury within the chest. No acute findings within the abdomen or pelvis. 3. Fatty liver. 4. These results were called by telephone at the time of interpretation on 10/25/2015 at 1:28 am to Dr. Everlene Balls , who verbally acknowledged these results. Electronically Signed   By: Andreas Newport M.D.   On: 10/25/2015 01:28   Dg Pelvis Portable  10/25/2015  CLINICAL DATA:  [place] tonight.  Anti coagulated. EXAM: PORTABLE PELVIS 1-2 VIEWS COMPARISON:  None. FINDINGS: A single supine portable view of the pelvis is negative for displaced fracture. Pubic symphysis and sacroiliac joints appear intact. Hip joints appear  intact. IMPRESSION: Negative. Electronically Signed   By: Andreas Newport M.D.   On: 10/25/2015 00:02   Dg Chest Port 1 View  11/01/2015  CLINICAL DATA:  Dyspnea this morning.  No chest pain. EXAM: PORTABLE CHEST 1 VIEW COMPARISON:  10/24/2015 FINDINGS: Cardiac silhouette is mildly enlarged. No mediastinal or hilar masses or evidence of adenopathy. Left anterior chest wall biventricular cardioverter-defibrillator is stable. Lungs are clear.  No pleural effusion or pneumothorax. IMPRESSION: No acute cardiopulmonary disease. Stable  appearance from the recent prior exam. Electronically Signed   By: Lajean Manes M.D.   On: 11/01/2015 11:03   Dg Chest Port 1 View  10/25/2015  CLINICAL DATA:  Fall tonight. On Coumadin. Right pelvic hematoma. Shortness of breath. EXAM: PORTABLE CHEST 1 VIEW COMPARISON:  11/21/2012 FINDINGS: Low lung volumes are again demonstrated. Both lungs remain clear. No evidence of pneumothorax or pleural effusion. Cardiomegaly stable. AICD remains in appropriate position. IMPRESSION: Stable cardiomegaly and low lung volumes.  No acute findings. Electronically Signed   By: Earle Gell M.D.   On: 10/25/2015 00:03    ASSESSMENT/PLAN:  Physical deconditioning - for rehabilitation  Right flank Hematoma - S/P fall; stable; continue Percocet 5/325 mg 1-2 tabs by mouth every 4 hours when necessary for pain  PAF - rate-controlled; Coumadin was stopped in the hospital; follow-up with cardiology  Chronic combined systolic and diastolic heart failure - no SOB; continue Lasix 40 mg daily; weigh daily; check BMP; follow-up with cardiology, Dr. Haroldine Laws, on 11/15/15  Anemia, acute blood loss -  S/P transfusion of packed red blood cell; hemoglobin 7.8; check CBC  Gout - continue allopurinol 300 mg +100 mg = 400 mg by mouth daily  Hyperlipidemia - continue Colestid 1 g to take 2 tabs = 2 g daily, Lopid 600 mg 1 tab by mouth twice a day and Pravachol 80 mg 1 tab by mouth daily at bedtime Lab  Results  Component Value Date   CHOL 158 07/20/2014   HDL 38.60* 07/20/2014   LDLCALC 73 09/30/2013   LDLDIRECT 80.0 07/20/2014   TRIG 332.0* 07/20/2014   CHOLHDL 4 07/20/2014    Hypokalemia - continue KCL ER 10 meq by mouth twice a day Lab Results  Component Value Date   K 3.4* 11/02/2015   Depression - continue Pamelor 25 mg 1 capsule by mouth daily at bedtime  Neuropathy - stable; continue gabapentin 800 mg 1 tab by mouth 3 times a day      Goals of care:  Short-term rehabilitation    Durenda Age, NP [place] [phone]

## 2015-11-08 ENCOUNTER — Non-Acute Institutional Stay (SKILLED_NURSING_FACILITY): Payer: PPO | Admitting: Internal Medicine

## 2015-11-08 ENCOUNTER — Encounter: Payer: Self-pay | Admitting: Internal Medicine

## 2015-11-08 DIAGNOSIS — R531 Weakness: Secondary | ICD-10-CM | POA: Diagnosis not present

## 2015-11-08 DIAGNOSIS — I5042 Chronic combined systolic (congestive) and diastolic (congestive) heart failure: Secondary | ICD-10-CM | POA: Diagnosis not present

## 2015-11-08 DIAGNOSIS — E785 Hyperlipidemia, unspecified: Secondary | ICD-10-CM | POA: Diagnosis not present

## 2015-11-08 DIAGNOSIS — R600 Localized edema: Secondary | ICD-10-CM | POA: Diagnosis not present

## 2015-11-08 DIAGNOSIS — G5793 Unspecified mononeuropathy of bilateral lower limbs: Secondary | ICD-10-CM | POA: Diagnosis not present

## 2015-11-08 DIAGNOSIS — I480 Paroxysmal atrial fibrillation: Secondary | ICD-10-CM | POA: Diagnosis not present

## 2015-11-08 DIAGNOSIS — S301XXS Contusion of abdominal wall, sequela: Secondary | ICD-10-CM | POA: Diagnosis not present

## 2015-11-08 DIAGNOSIS — Z79899 Other long term (current) drug therapy: Secondary | ICD-10-CM | POA: Diagnosis not present

## 2015-11-08 DIAGNOSIS — D62 Acute posthemorrhagic anemia: Secondary | ICD-10-CM | POA: Diagnosis not present

## 2015-11-08 DIAGNOSIS — F329 Major depressive disorder, single episode, unspecified: Secondary | ICD-10-CM

## 2015-11-08 LAB — CBC AND DIFFERENTIAL
HCT: 30 % — AB (ref 36–46)
Hemoglobin: 9 g/dL — AB (ref 12.0–16.0)
PLATELETS: 400 10*3/uL — AB (ref 150–399)
WBC: 6.5 10*3/mL

## 2015-11-08 LAB — BASIC METABOLIC PANEL
BUN: 15 mg/dL (ref 4–21)
CREATININE: 0.9 mg/dL (ref 0.5–1.1)
GLUCOSE: 111 mg/dL
POTASSIUM: 3.8 mmol/L (ref 3.4–5.3)
Sodium: 142 mmol/L (ref 137–147)

## 2015-11-08 NOTE — Progress Notes (Signed)
LOCATION: [place]  PCP: Jonathon Bellows, MD   Code Status: Full Code  Goals of care: Advanced Directive information Advanced Directives 10/24/2015  Does patient have an advance directive? Yes  Type of Advance Directive [place]  Does patient want to make changes to advanced directive? No - Patient declined  Copy of advanced directive(s) in chart? No - copy requested       Extended Emergency Contact Information Primary Emergency Contact: [facility] Address: [address]          [place], [place] [address] [place] Phone: [phone] Mobile Phone: [phone] Relation: Son Secondary Emergency Contact: Pincus Large Address: [address] [place], [place] [address] [place] Phone: [phone] Mobile Phone: [phone] Relation: Relative   Allergies  Allergen Reactions  . Quinapril Hcl Swelling    Tongue and throat  . Tape     Plastic tape causes irritation.   . Neosporin [Neomycin-Polymyxin-Gramicidin] Rash    Chief Complaint  Patient presents with  . New Admit To SNF    New Admission     HPI:  Patient is a 82 y.o. female seen today for short term rehabilitation post hospital admission from 10/24/15-11/03/15 post fall with right flank hematoma. Trauma team was consulted. Her coumadin was held and reversed with vitamin k and FFP. She also had blood loss anemia and received PRBC transfusion. She had chf exacerbation and was seen by cardiology. She is seen in her room today.    Review of Systems:  Constitutional: Negative for fever, chills, diaphoresis. Feels weak and tired.  HENT: Negative for congestion,hearing loss, sore throat, difficulty swallowing. Positive for occasional headaches and nasal discharge.  Eyes: Negative for blurred vision, double vision and discharge.  Respiratory: Negative for cough, shortness of breath and wheezing. On oxygen. was using o2 at home.    Cardiovascular: Negative for chest pain, palpitations, leg swelling.  Gastrointestinal: Negative for heartburn, nausea, vomiting, abdominal pain.  Genitourinary: Negative for dysuria Musculoskeletal: Negative for fall in the facility.  Skin: Negative for itching, rash.  Neurological: Negative for dizziness. Psychiatric/Behavioral: Negative for depression   Past Medical History  Diagnosis Date  . Pacemaker   . ICD (implantable cardiac defibrillator) in place   . Myocardial infarction ([employer]) 2008  . Sleep apnea   . Arthritis   . Neuromuscular disorder ([place])   . CHF (congestive heart failure) ([place])   . Hypertension   . Dysrhythmia   . Chronic systolic heart failure ([place])   . Acute systolic heart failure ([place])   . Acute blood loss anemia    Past Surgical History  Procedure Laterality Date  . Cholecystectomy    . Abdominal hysterectomy    . Ankle surgery    . Insert / replace / remove pacemaker  2008  . Knee ligament reconstruction  2012  . Tonsillectomy    . Cardiac catheterization    . Eye surgery     Social History:   reports that she has quit smoking. She has quit using smokeless tobacco. She reports that she does not drink alcohol or use illicit drugs.  Family History  Problem Relation Age of Onset  . Tuberculosis Father   . Stroke Mother   . Congestive Heart Failure Mother   . Diabetes Brother   . Stroke Brother   . Cancer Brother   . Heart attack Son     Medications:  Medication List       This list is accurate as of: 11/08/15  4:40 PM.  Always use your most recent med list.               allopurinol 300 MG tablet  Commonly known as:  ZYLOPRIM  Take 300 mg by mouth daily. Takes along with a 100mg  tablet to equal 400mg      allopurinol 100 MG tablet  Commonly known as:  ZYLOPRIM  Take 100 mg by mouth daily. Takes along with a 300 mg tablet to equal 400 mg     CALCIUM 600+D 600-400 MG-UNIT tablet  Generic drug:  Calcium Carbonate-Vitamin D  Take 1  tablet by mouth 2 (two) times daily.     colestipol 1 g tablet  Commonly known as:  COLESTID  Take 2 g by mouth daily.     furosemide 40 MG tablet  Commonly known as:  LASIX  Take 1 tablet (40 mg total) by mouth daily.     gabapentin 800 MG tablet  Commonly known as:  NEURONTIN  Take 800 mg by mouth 3 (three) times daily.     gemfibrozil 600 MG tablet  Commonly known as:  LOPID  Take 600 mg by mouth 2 (two) times daily before a meal.     multivitamin tablet  Take 1 tablet by mouth daily.     nitroGLYCERIN 0.4 MG SL tablet  Commonly known as:  NITROSTAT  Place 1 tablet (0.4 mg total) under the tongue every 5 (five) minutes as needed for chest pain.     nortriptyline 25 MG capsule  Commonly known as:  PAMELOR  Take 25 mg by mouth at bedtime.     oxyCODONE-acetaminophen 5-325 MG tablet  Commonly known as:  ROXICET  Take 1-2 tablets by mouth every 4 (four) hours as needed (Pain).     OXYGEN  Inhale 2 L/min into the lungs as needed.     potassium chloride 10 MEQ tablet  Commonly known as:  K-DUR  Take 1 tablet (10 mEq total) by mouth 2 (two) times daily.     pravastatin 80 MG tablet  Commonly known as:  PRAVACHOL  Take 1 tablet (80 mg total) by mouth every evening.     PROCEL Powd  Take 1 scoop by mouth 2 (two) times daily.     Vitamin D3 1000 units Caps  Take 1 capsule by mouth daily.        Immunizations: Immunization History  Administered Date(s) Administered  . Influenza,inj,Quad PF,36+ Mos 04/01/2014     Physical Exam: Filed Vitals:   11/08/15 1636  BP: 131/68  Pulse: 89  Temp: 98 F (36.7 C)  TempSrc: Oral  Resp: 16  Height: 5\' 6"  (1.676 m)  Weight: 253 lb (114.76 kg)  SpO2: 97%   Body mass index is 40.85 kg/(m^2).  General- elderly female, obese, in no acute distress Head- normocephalic, atraumatic Nose- no nasal discharge Throat- moist mucus membrane  Eyes- PERRLA, EOMI, no pallor, no icterus Neck- no cervical  lymphadenopathy Cardiovascular- normal s1,s2, no murmur, 1+ leg edema, pacemaker to left chest Respiratory- bilateral clear to auscultation, no wheeze, no rhonchi, no crackles, on o2 Abdomen- bowel sounds present, soft, non tender Musculoskeletal- able to move all 4 extremities, generalized weakness Neurological- alert and oriented to person, place and time Skin- warm and dry, right flank hematoma + Psychiatry- normal mood and affect    Labs reviewed: Basic Metabolic Panel:  Recent Labs  10/28/15 0328 10/29/15  0418 11/02/15 0736  NA 142 141 140  K 3.9 3.8 3.4*  CL 97* 95* 94*  CO2 34* 34* 32  GLUCOSE 131* 120* 124*  BUN 17 19 16   CREATININE 1.10* 1.00 0.90  CALCIUM 8.9 9.0 9.1   Liver Function Tests:  Recent Labs  10/25/15 0050 10/26/15 0655  AST 20 19  ALT 13* 13*  ALKPHOS 42 46  BILITOT 0.6 0.9  PROT 6.2* 5.7*  ALBUMIN 3.5 3.2*   No results for input(s): LIPASE, AMYLASE in the last 8760 hours. No results for input(s): AMMONIA in the last 8760 hours. CBC:  Recent Labs  05/27/15 0855  10/26/15 1626 10/27/15 0325  10/29/15 0841 10/30/15 0633 10/31/15 0629  WBC 5.9  < > 11.7* 11.5*  < > 9.0 9.4 9.5  NEUTROABS 2.7  --  7.9* 7.1  --   --   --   --   HGB 12.4  < > 7.5* 7.3*  < > 7.7* 7.4* 7.8*  HCT 39.4  < > 22.6* 22.8*  < > 25.7* 24.6* 24.7*  MCV 98.0  < > 89.0 89.4  < > 92.8 93.5 93.9  PLT 228  < > 145* 166  < > 271 308 331  < > = values in this interval not displayed.   Radiological Exams: Ct Head Wo Contrast  10/25/2015  CLINICAL DATA:  Fall on Coumadin. EXAM: CT HEAD WITHOUT CONTRAST CT CERVICAL SPINE WITHOUT CONTRAST TECHNIQUE: Multidetector CT imaging of the head and cervical spine was performed following the standard protocol without intravenous contrast. Multiplanar CT image reconstructions of the cervical spine were also generated. COMPARISON:  05/27/2015 FINDINGS: CT HEAD FINDINGS There is no intracranial hemorrhage, mass or evidence of acute  infarction. There is moderate generalized atrophy. There is moderate chronic microvascular ischemic change. There is no significant extra-axial fluid collection. No acute intracranial findings are evident. The calvarium and skullbase are intact. The visible paranasal sinuses and orbits are unremarkable. CT CERVICAL SPINE FINDINGS The vertebral column, pedicles and facet articulations are intact. There is no evidence of acute fracture. No acute soft tissue abnormalities are evident. Mild arthritic changes are present. No bone lesion or bony destruction. IMPRESSION: 1. No acute intracranial findings. There is moderate generalized atrophy and chronic appearing white matter hypodensities which likely represent small vessel ischemic disease. 2. Negative for acute cervical spine fracture Electronically Signed   By: Andreas Newport M.D.   On: 10/25/2015 01:18   Ct Chest W Contrast  10/25/2015  CLINICAL DATA:  [place].  Anti coagulated. EXAM: CT CHEST, ABDOMEN, AND PELVIS WITH CONTRAST TECHNIQUE: Multidetector CT imaging of the chest, abdomen and pelvis was performed following the standard protocol during bolus administration of intravenous contrast. CONTRAST:  100 mL Isovue-300 intravenous COMPARISON:  None. FINDINGS: CT CHEST The lungs are clear except for minor chronic appearing posterior base linear opacities which likely represent scarring. There is no pneumothorax. There is no effusion. The airways are patent and intact. No intrathoracic vascular injury. Mediastinum and hila appear unremarkable. No displaced fracture. CT ABDOMEN AND PELVIS There are normal intact appearances of the liver, spleen, pancreas, adrenals and kidneys. Mild diffuse fatty infiltration of the liver without focal lesion. Prior cholecystectomy. Bowel is unremarkable. There is no peritoneal blood or free air. The abdominal aorta is normal in caliber and intact. Prior hysterectomy.  No adnexal abnormalities. No acute fracture is evident. There is  a hematoma of the subcutaneous tissues in the right flank region, measuring approximately 8 by 14  x 17 cm. There is a moderate volume vascular contrast extravasated into the hematoma, indicating active hemorrhage at the time of the scan. This hematoma is entirely superficial to the abdominal wall musculature. No significant intramuscular hemorrhage. There is no hemorrhage into the peritoneum or retroperitoneum. IMPRESSION: 1. Active hemorrhage into a large subcutaneous hematoma in the right flank region. The hematoma is confined to the subcutaneous tissues. No significant intramuscular component. No hemorrhage into the peritoneum or retroperitoneum. 2. No acute traumatic injury within the chest. No acute findings within the abdomen or pelvis. 3. Fatty liver. 4. These results were called by telephone at the time of interpretation on 10/25/2015 at 1:28 am to Dr. Everlene Balls , who verbally acknowledged these results. Electronically Signed   By: Andreas Newport M.D.   On: 10/25/2015 01:28   Ct Cervical Spine Wo Contrast  10/25/2015  CLINICAL DATA:  Fall on Coumadin. EXAM: CT HEAD WITHOUT CONTRAST CT CERVICAL SPINE WITHOUT CONTRAST TECHNIQUE: Multidetector CT imaging of the head and cervical spine was performed following the standard protocol without intravenous contrast. Multiplanar CT image reconstructions of the cervical spine were also generated. COMPARISON:  05/27/2015 FINDINGS: CT HEAD FINDINGS There is no intracranial hemorrhage, mass or evidence of acute infarction. There is moderate generalized atrophy. There is moderate chronic microvascular ischemic change. There is no significant extra-axial fluid collection. No acute intracranial findings are evident. The calvarium and skullbase are intact. The visible paranasal sinuses and orbits are unremarkable. CT CERVICAL SPINE FINDINGS The vertebral column, pedicles and facet articulations are intact. There is no evidence of acute fracture. No acute soft tissue  abnormalities are evident. Mild arthritic changes are present. No bone lesion or bony destruction. IMPRESSION: 1. No acute intracranial findings. There is moderate generalized atrophy and chronic appearing white matter hypodensities which likely represent small vessel ischemic disease. 2. Negative for acute cervical spine fracture Electronically Signed   By: Andreas Newport M.D.   On: 10/25/2015 01:18   Ct Abdomen Pelvis W Contrast  10/25/2015  CLINICAL DATA:  [place].  Anti coagulated. EXAM: CT CHEST, ABDOMEN, AND PELVIS WITH CONTRAST TECHNIQUE: Multidetector CT imaging of the chest, abdomen and pelvis was performed following the standard protocol during bolus administration of intravenous contrast. CONTRAST:  100 mL Isovue-300 intravenous COMPARISON:  None. FINDINGS: CT CHEST The lungs are clear except for minor chronic appearing posterior base linear opacities which likely represent scarring. There is no pneumothorax. There is no effusion. The airways are patent and intact. No intrathoracic vascular injury. Mediastinum and hila appear unremarkable. No displaced fracture. CT ABDOMEN AND PELVIS There are normal intact appearances of the liver, spleen, pancreas, adrenals and kidneys. Mild diffuse fatty infiltration of the liver without focal lesion. Prior cholecystectomy. Bowel is unremarkable. There is no peritoneal blood or free air. The abdominal aorta is normal in caliber and intact. Prior hysterectomy.  No adnexal abnormalities. No acute fracture is evident. There is a hematoma of the subcutaneous tissues in the right flank region, measuring approximately 8 by 14 x 17 cm. There is a moderate volume vascular contrast extravasated into the hematoma, indicating active hemorrhage at the time of the scan. This hematoma is entirely superficial to the abdominal wall musculature. No significant intramuscular hemorrhage. There is no hemorrhage into the peritoneum or retroperitoneum. IMPRESSION: 1. Active hemorrhage  into a large subcutaneous hematoma in the right flank region. The hematoma is confined to the subcutaneous tissues. No significant intramuscular component. No hemorrhage into the peritoneum or retroperitoneum. 2. No acute traumatic  injury within the chest. No acute findings within the abdomen or pelvis. 3. Fatty liver. 4. These results were called by telephone at the time of interpretation on 10/25/2015 at 1:28 am to Dr. Everlene Balls , who verbally acknowledged these results. Electronically Signed   By: Andreas Newport M.D.   On: 10/25/2015 01:28   Dg Pelvis Portable  10/25/2015  CLINICAL DATA:  [place] tonight.  Anti coagulated. EXAM: PORTABLE PELVIS 1-2 VIEWS COMPARISON:  None. FINDINGS: A single supine portable view of the pelvis is negative for displaced fracture. Pubic symphysis and sacroiliac joints appear intact. Hip joints appear intact. IMPRESSION: Negative. Electronically Signed   By: Andreas Newport M.D.   On: 10/25/2015 00:02   Dg Chest Port 1 View  11/01/2015  CLINICAL DATA:  Dyspnea this morning.  No chest pain. EXAM: PORTABLE CHEST 1 VIEW COMPARISON:  10/24/2015 FINDINGS: Cardiac silhouette is mildly enlarged. No mediastinal or hilar masses or evidence of adenopathy. Left anterior chest wall biventricular cardioverter-defibrillator is stable. Lungs are clear.  No pleural effusion or pneumothorax. IMPRESSION: No acute cardiopulmonary disease. Stable appearance from the recent prior exam. Electronically Signed   By: Lajean Manes M.D.   On: 11/01/2015 11:03   Dg Chest Port 1 View  10/25/2015  CLINICAL DATA:  Fall tonight. On Coumadin. Right pelvic hematoma. Shortness of breath. EXAM: PORTABLE CHEST 1 VIEW COMPARISON:  11/21/2012 FINDINGS: Low lung volumes are again demonstrated. Both lungs remain clear. No evidence of pneumothorax or pleural effusion. Cardiomegaly stable. AICD remains in appropriate position. IMPRESSION: Stable cardiomegaly and low lung volumes.  No acute findings. Electronically  Signed   By: Earle Gell M.D.   On: 10/25/2015 00:03    Assessment/Plan   Generalized weakness Will have patient work with PT/OT as tolerated to regain strength and restore function.  Fall precautions are in place.  Right flank hematoma Monitor clinically, continue percocet 5-325 mg 1-2 tab q4h prn pain  Blood loss anemia With flank hematoma. Monitor cbc. S/p 2 u prbc transfusion in hospital  afib Rate controlled, off coumadin with hematoma at present, to follow with cardiology  Chronic heart failure  continue Lasix 40 mg daily and monitor weight, check bmp. Continue kcl. Wean off o2 as tolerated  Neuropathy Continue gabapentin 800 mg tid and monitor  Leg edema Continue lasix and add ted hose  Hyperlipidemia  Continue pravachol  Depression continue Pamelor 25 mg daily     Goals of care: short term rehabilitation   Labs/tests ordered: cbc, cmp  Family/ staff Communication: reviewed care plan with patient and nursing supervisor    Blanchie Serve, MD Internal Medicine [address] [place], [place] [address] Cell Phone (Monday-Friday 8 am - 5 pm): [phone] On Call: [phone] and follow prompts after 5 pm and on weekends Office Phone: [phone] Office Fax: [phone]

## 2015-11-09 DIAGNOSIS — G4733 Obstructive sleep apnea (adult) (pediatric): Secondary | ICD-10-CM | POA: Diagnosis not present

## 2015-11-14 NOTE — Progress Notes (Signed)
Patient ID: Bianca [name], female   DOB: May 20, 1933, 82 y.o.   MRN: [id number]    Advanced Heart Failure Clinic Note   Primary Physician: Maurice Small, MD Primary Cardiologist: Dr Marlou Porch Primary HF: Dr. Haroldine Laws   HPI:  Bianca [name] is a 82 y.o. female with nonischemic cardiomyopathy, hx of atrial fibrillation-currently with chronic anticoagulation, hx of Chronic kidney disease-stage III, chronic systolic heart failure-status post Bi- ventricular ICD-followed by Dr. Lovena Le. No defibrillations. EF 35%. Symptoms have been very stable. NYHA class II except for hospitalization for heart failure she was discharged on 11/24/12 with primary diagnosis of acute on chronic systolic heart failure.  Admitted 10/24/15 after fall and developing a flank hematoma. HF team consulted to help manage fluid overload while getting multiple blood products to reverse her INR. Got 4u FFP and 2 RBCs, initially. HF team continued to follow for fluid management, including several doses of IV lasix. Lasix increased to 40 mg daily po prior to d/c to SNF.  Discharge weight 253 lbs.  She presents today for post hospital follow up.  She is currently staying at [facility]. Weight down 3 lbs from discharge.  Working with rehab and feels like she is improving.  Flank hematoma still very large, but has not gotten any bigger.  Daughter has taken pictures of its healing on her phone.  Ecchymosis much improved, non present currently.  Still slightly tender to the touch per patient. Denies orthopnea or PND. No DOE with amount of activity she is able to do.    Past Medical History  Diagnosis Date  . Pacemaker   . ICD (implantable cardiac defibrillator) in place   . Myocardial infarction ([place]) 2008  . Sleep apnea   . Arthritis   . Neuromuscular disorder ([place])   . CHF (congestive heart failure) ([place])   . Hypertension   . Dysrhythmia   . Chronic systolic heart failure ([place])   . Acute systolic heart failure ([place])   . Acute  blood loss anemia     Current Outpatient Prescriptions  Medication Sig Dispense Refill  . allopurinol (ZYLOPRIM) 100 MG tablet Take 100 mg by mouth daily. Takes along with a 300 mg tablet to equal 400 mg    . allopurinol (ZYLOPRIM) 300 MG tablet Take 300 mg by mouth daily. Takes along with a 100mg  tablet to equal 400mg     . Calcium Carbonate-Vitamin D (CALCIUM 600+D) 600-400 MG-UNIT per tablet Take 1 tablet by mouth 2 (two) times daily.    . Cholecalciferol (VITAMIN D3) 1000 UNITS CAPS Take 1 capsule by mouth daily.    . colestipol (COLESTID) 1 G tablet Take 2 g by mouth daily.     . furosemide (LASIX) 40 MG tablet Take 1 tablet (40 mg total) by mouth daily.    Marland Kitchen gabapentin (NEURONTIN) 800 MG tablet Take 800 mg by mouth 3 (three) times daily.     Marland Kitchen gemfibrozil (LOPID) 600 MG tablet Take 600 mg by mouth 2 (two) times daily before a meal.    . Multiple Vitamin (MULTIVITAMIN) tablet Take 1 tablet by mouth daily.    . nitroGLYCERIN (NITROSTAT) 0.4 MG SL tablet Place 1 tablet (0.4 mg total) under the tongue every 5 (five) minutes as needed for chest pain. 25 tablet 6  . nortriptyline (PAMELOR) 25 MG capsule Take 25 mg by mouth at bedtime.    Marland Kitchen oxyCODONE-acetaminophen (ROXICET) 5-325 MG tablet Take 1-2 tablets by mouth every 4 (four) hours as needed (Pain). 36 tablet 0  .  OXYGEN Inhale 2 L/min into the lungs as needed.    . potassium chloride (K-DUR) 10 MEQ tablet Take 1 tablet (10 mEq total) by mouth 2 (two) times daily.    . pravastatin (PRAVACHOL) 80 MG tablet Take 1 tablet (80 mg total) by mouth every evening. 90 tablet 1  . Protein (PROCEL) POWD Take 1 scoop by mouth 2 (two) times daily.     No current facility-administered medications for this encounter.    Allergies  Allergen Reactions  . Quinapril Hcl Swelling    Tongue and throat  . Tape     Plastic tape causes irritation.   . Neosporin [Neomycin-Polymyxin-Gramicidin] Rash      Social History   Social History  . Marital  Status: Widowed    Spouse Name: N/A  . Number of Children: N/A  . Years of Education: N/A   Occupational History  . Not on file.   Social History Main Topics  . Smoking status: Former Research scientist (life sciences)  . Smokeless tobacco: Former Systems developer  . Alcohol Use: No  . Drug Use: No  . Sexual Activity: Not Currently   Other Topics Concern  . Not on file   Social History Narrative      Family History  Problem Relation Age of Onset  . Tuberculosis Father   . Stroke Mother   . Congestive Heart Failure Mother   . Diabetes Brother   . Stroke Brother   . Cancer Brother   . Heart attack Son     Danley Danker Vitals:   11/15/15 1035  BP: 120/74  Pulse: 100  Weight: 250 lb 9.6 oz (113.671 kg)  SpO2: 90%   Wt Readings from Last 3 Encounters:  11/15/15 250 lb 9.6 oz (113.671 kg)  11/08/15 253 lb (114.76 kg)  11/07/15 253 lb (114.76 kg)    EKG: A-sensed, Vpaced rhythm, 100 bpm  PHYSICAL EXAM: General: Elderly appearing. NAD. In [place]. Daughter present.   HEENT: normal Neck: supple. JVP not elevated. Carotids 2+ bilat; no bruits. No thyromegaly or lymphadenopathy noted.  Cor: PMI nondisplaced. RRR, no M/G/R Lungs: CTAB, normal effort On 2 liters [place].  Abdomen: obese soft, nondistended. Large Right flank hematoma., no ecchymosis. No HSM. No bruits or masses. Good bowel sounds. Extremities: no cyanosis, clubbing, rash, Trace edema, TED hose in place.  Neuro: alert & orientedx3, cranial nerves grossly intact. moves all 4 extremities w/o difficulty. Affect pleasant   ASSESSMENT & PLAN:  1. NICM EF 35% s/p BiV ICD - [employer]. -> EF 50-55% on echo 5/10 2. Paroxysmal AF  3. Morbid obesity with h/o CO2 retention  4. R Flank Hematoma   She is stable from a HF standpoint.  NYHA Class II-III symptoms, difficult to tell with deconditioning.  Continue Lasix 40 mg daily with K 10 meq BID. BMET today.  R Flank hematoma stable.  Still very large. Bruising has subsided, still mildly tender.   Needs to make  follow up with PCP.  Will check CBC today to make she she is not still bleeding.   This patients CHA2DS2-VASc Score and unadjusted Ischemic Stroke Rate (% per year) is equal to 9.7 % stroke rate/year from a score of 6. Continue to hold coumadin for now.  Will address resumption at next visit.   Will schedule follow up ~4 weeks with MD.  Beryle Beams" [place], PA-C 11/15/2015 11:24 AM

## 2015-11-15 ENCOUNTER — Ambulatory Visit (HOSPITAL_COMMUNITY)
Admit: 2015-11-15 | Discharge: 2015-11-15 | Disposition: A | Discharge status: Home / Self Care | Payer: PPO | Source: Ambulatory Visit | Attending: Cardiology | Admitting: Cardiology

## 2015-11-15 VITALS — BP 120/74 | HR 100 | Wt 250.6 lb

## 2015-11-15 DIAGNOSIS — I5043 Acute on chronic combined systolic (congestive) and diastolic (congestive) heart failure: Secondary | ICD-10-CM | POA: Diagnosis not present

## 2015-11-15 DIAGNOSIS — Z79899 Other long term (current) drug therapy: Secondary | ICD-10-CM | POA: Diagnosis not present

## 2015-11-15 DIAGNOSIS — Z888 Allergy status to other drugs, medicaments and biological substances status: Secondary | ICD-10-CM | POA: Insufficient documentation

## 2015-11-15 DIAGNOSIS — Z823 Family history of stroke: Secondary | ICD-10-CM | POA: Insufficient documentation

## 2015-11-15 DIAGNOSIS — W19XXXD Unspecified fall, subsequent encounter: Secondary | ICD-10-CM | POA: Diagnosis not present

## 2015-11-15 DIAGNOSIS — S301XXD Contusion of abdominal wall, subsequent encounter: Secondary | ICD-10-CM | POA: Insufficient documentation

## 2015-11-15 DIAGNOSIS — I110 Hypertensive heart disease with heart failure: Secondary | ICD-10-CM | POA: Insufficient documentation

## 2015-11-15 DIAGNOSIS — I428 Other cardiomyopathies: Secondary | ICD-10-CM | POA: Insufficient documentation

## 2015-11-15 DIAGNOSIS — G4730 Sleep apnea, unspecified: Secondary | ICD-10-CM | POA: Diagnosis not present

## 2015-11-15 DIAGNOSIS — Z8249 Family history of ischemic heart disease and other diseases of the circulatory system: Secondary | ICD-10-CM | POA: Insufficient documentation

## 2015-11-15 DIAGNOSIS — I5042 Chronic combined systolic (congestive) and diastolic (congestive) heart failure: Secondary | ICD-10-CM | POA: Diagnosis not present

## 2015-11-15 DIAGNOSIS — I252 Old myocardial infarction: Secondary | ICD-10-CM | POA: Insufficient documentation

## 2015-11-15 DIAGNOSIS — I480 Paroxysmal atrial fibrillation: Secondary | ICD-10-CM

## 2015-11-15 DIAGNOSIS — Z833 Family history of diabetes mellitus: Secondary | ICD-10-CM | POA: Diagnosis not present

## 2015-11-15 DIAGNOSIS — Z87891 Personal history of nicotine dependence: Secondary | ICD-10-CM | POA: Insufficient documentation

## 2015-11-15 DIAGNOSIS — Z95810 Presence of automatic (implantable) cardiac defibrillator: Secondary | ICD-10-CM | POA: Diagnosis not present

## 2015-11-15 LAB — CBC
HCT: 34.3 % — ABNORMAL LOW (ref 36.0–46.0)
HEMOGLOBIN: 10.3 g/dL — AB (ref 12.0–15.0)
MCH: 29.3 pg (ref 26.0–34.0)
MCHC: 30 g/dL (ref 30.0–36.0)
MCV: 97.4 fL (ref 78.0–100.0)
PLATELETS: 299 10*3/uL (ref 150–400)
RBC: 3.52 MIL/uL — AB (ref 3.87–5.11)
RDW: 19.6 % — ABNORMAL HIGH (ref 11.5–15.5)
WBC: 5.8 10*3/uL (ref 4.0–10.5)

## 2015-11-15 LAB — BASIC METABOLIC PANEL
ANION GAP: 10 (ref 5–15)
BUN: 10 mg/dL (ref 6–20)
CALCIUM: 10 mg/dL (ref 8.9–10.3)
CO2: 31 mmol/L (ref 22–32)
CREATININE: 0.83 mg/dL (ref 0.44–1.00)
Chloride: 98 mmol/L — ABNORMAL LOW (ref 101–111)
Glucose, Bld: 130 mg/dL — ABNORMAL HIGH (ref 65–99)
Potassium: 3.2 mmol/L — ABNORMAL LOW (ref 3.5–5.1)
SODIUM: 139 mmol/L (ref 135–145)

## 2015-11-15 NOTE — Patient Instructions (Signed)
Labs today  Be sure to schedule a follow up with your primary care provider for routine follow up/hospital follow up.  Your physician recommends that you schedule a follow-up appointment in: 4-5 weeks with Dr Haroldine Laws

## 2015-11-16 ENCOUNTER — Telehealth: Payer: Self-pay | Admitting: Cardiology

## 2015-11-16 ENCOUNTER — Encounter: Payer: PPO | Admitting: *Deleted

## 2015-11-16 NOTE — Telephone Encounter (Signed)
Confirmed remote transmission w/ pt husband.   

## 2015-11-17 DIAGNOSIS — R262 Difficulty in walking, not elsewhere classified: Secondary | ICD-10-CM | POA: Diagnosis not present

## 2015-11-17 DIAGNOSIS — M1380 Other specified arthritis, unspecified site: Secondary | ICD-10-CM | POA: Diagnosis not present

## 2015-11-17 DIAGNOSIS — I5033 Acute on chronic diastolic (congestive) heart failure: Secondary | ICD-10-CM | POA: Diagnosis not present

## 2015-11-17 DIAGNOSIS — I10 Essential (primary) hypertension: Secondary | ICD-10-CM | POA: Diagnosis not present

## 2015-11-17 DIAGNOSIS — Z950 Presence of cardiac pacemaker: Secondary | ICD-10-CM | POA: Diagnosis not present

## 2015-11-17 DIAGNOSIS — I480 Paroxysmal atrial fibrillation: Secondary | ICD-10-CM | POA: Diagnosis not present

## 2015-11-17 DIAGNOSIS — M6281 Muscle weakness (generalized): Secondary | ICD-10-CM | POA: Diagnosis not present

## 2015-11-17 DIAGNOSIS — Z5189 Encounter for other specified aftercare: Secondary | ICD-10-CM | POA: Diagnosis not present

## 2015-11-17 DIAGNOSIS — G4730 Sleep apnea, unspecified: Secondary | ICD-10-CM | POA: Diagnosis not present

## 2015-11-18 ENCOUNTER — Encounter: Payer: Self-pay | Admitting: Cardiology

## 2015-11-18 ENCOUNTER — Non-Acute Institutional Stay (SKILLED_NURSING_FACILITY): Payer: PPO | Admitting: Internal Medicine

## 2015-11-18 ENCOUNTER — Encounter: Payer: Self-pay | Admitting: Internal Medicine

## 2015-11-18 DIAGNOSIS — I5042 Chronic combined systolic (congestive) and diastolic (congestive) heart failure: Secondary | ICD-10-CM | POA: Diagnosis not present

## 2015-11-18 DIAGNOSIS — D62 Acute posthemorrhagic anemia: Secondary | ICD-10-CM

## 2015-11-18 DIAGNOSIS — E876 Hypokalemia: Secondary | ICD-10-CM

## 2015-11-18 DIAGNOSIS — S301XXS Contusion of abdominal wall, sequela: Secondary | ICD-10-CM

## 2015-11-18 DIAGNOSIS — R5381 Other malaise: Secondary | ICD-10-CM | POA: Diagnosis not present

## 2015-11-18 DIAGNOSIS — I4891 Unspecified atrial fibrillation: Secondary | ICD-10-CM | POA: Diagnosis not present

## 2015-11-18 NOTE — Progress Notes (Signed)
Patient ID: Bianca [name], female   DOB: 1932/12/08, 82 y.o.   MRN: [id number]     Location:  [place] [facility] [place] Room Number: 103-P Place of Service:  SNF (31)    PCP: Jonathon Bellows, MD Patient Care Team: Maurice Small, MD as PCP - General [facility])  Extended Emergency Contact Information Primary Emergency Contact: [facility] Address: [address] [place]          [place], [place] [address] Johnnette Litter of [place] Phone: [phone] Mobile Phone: [phone] Relation: Son Secondary Emergency Contact: Pincus Large Address: [address] [place]          [place], [place] [address] Johnnette Litter of [place] Phone: [phone] Mobile Phone: [phone] Relation: Relative  Code Status: FULL CODE   Goals of care:  Advanced Directive information Advanced Directives 10/24/2015  Does patient have an advance directive? Yes  Type of Advance Directive [place]  Does patient want to make changes to advanced directive? No - Patient declined  Copy of advanced directive(s) in chart? No - copy requested     Allergies  Allergen Reactions  . Quinapril Hcl Swelling    Tongue and throat  . Tape     Plastic tape causes irritation.   . Neosporin [Neomycin-Polymyxin-Gramicidin] Rash    Chief Complaint  Patient presents with  . Discharge Note    HPI:  82 y.o. female  Patient is seen today for discharge visit. She is here for short term rehabilitation post fall with right flank hematoma, mild chf exacerbation and acute blood loss anemia. She has been working with therapy team here and making progress. She continues to be on o2 during daytime with drop in her o2 saturation with exertion. At home, she was on oxygen only at bedtime. She has PMH of CHF and OSA.    Review of system: Constitutional: Negative for fever, chills and diaphoresis.  HENT: Negative for congestion, difficulty swallowing and sore throat.   Eyes: Negative for  blurred vision, double vision and discharge.  Respiratory: Negative for cough and wheezing.  she has dyspnea with exertion. Cardiovascular: Negative for chest pain, palpitations. Positive for leg swelling.  Gastrointestinal: Negative for heartburn, nausea, vomiting, abdominal pain.  Genitourinary: Negative for dysuria and flank pain.  Musculoskeletal: Negative for back pain, falls in the facility Skin: Negative for itching and rash.  Neurological: Negative for dizziness and headaches.  Psychiatric/Behavioral: Negative for depression   Past Medical History  Diagnosis Date  . Pacemaker   . ICD (implantable cardiac defibrillator) in place   . Myocardial infarction ([place]) 2008  . Sleep apnea   . Arthritis   . Neuromuscular disorder ([place])   . CHF (congestive heart failure) ([place])   . Hypertension   . Dysrhythmia   . Chronic systolic heart failure ([place])   . Acute systolic heart failure ([place])   . Acute blood loss anemia     Past Surgical History  Procedure Laterality Date  . Cholecystectomy    . Abdominal hysterectomy    . Ankle surgery    . Insert / replace / remove pacemaker  2008  . Knee ligament reconstruction  2012  . Tonsillectomy    . Cardiac catheterization    . Eye surgery        reports that she has quit smoking. She has quit using smokeless tobacco. She reports that she does not drink alcohol or use illicit drugs. Social History   Social History  . Marital  Status: Widowed    Spouse Name: N/A  . Number of Children: N/A  . Years of Education: N/A   Occupational History  . Not on file.   Social History Main Topics  . Smoking status: Former Research scientist (life sciences)  . Smokeless tobacco: Former Systems developer  . Alcohol Use: No  . Drug Use: No  . Sexual Activity: Not Currently   Other Topics Concern  . Not on file   Social History Narrative   Functional Status Survey:    Allergies  Allergen Reactions  . Quinapril Hcl Swelling    Tongue and throat  . Tape     Plastic tape  causes irritation.   . Neosporin [Neomycin-Polymyxin-Gramicidin] Rash    Pertinent  Health Maintenance Due  Topic Date Due  . DEXA SCAN  11/08/1999  . PNA vac Low Risk Adult (1 of 2 - PCV13) 11/08/1999  . INFLUENZA VACCINE  01/17/2016    Medications:   Medication List       This list is accurate as of: 11/18/15  3:50 PM.  Always use your most recent med list.               allopurinol 300 MG tablet  Commonly known as:  ZYLOPRIM  Take 300 mg by mouth daily. Takes along with a 100mg  tablet to equal 400mg      allopurinol 100 MG tablet  Commonly known as:  ZYLOPRIM  Take 100 mg by mouth daily. Takes along with a 300 mg tablet to equal 400 mg     CALCIUM 600+D 600-400 MG-UNIT tablet  Generic drug:  Calcium Carbonate-Vitamin D  Take 1 tablet by mouth 2 (two) times daily.     colestipol 1 g tablet  Commonly known as:  COLESTID  Take 2 g by mouth daily.     furosemide 40 MG tablet  Commonly known as:  LASIX  Take 1 tablet (40 mg total) by mouth daily.     gabapentin 800 MG tablet  Commonly known as:  NEURONTIN  Take 800 mg by mouth 3 (three) times daily.     multivitamin tablet  Take 1 tablet by mouth daily.     nitroGLYCERIN 0.4 MG SL tablet  Commonly known as:  NITROSTAT  Place 1 tablet (0.4 mg total) under the tongue every 5 (five) minutes as needed for chest pain.     nortriptyline 25 MG capsule  Commonly known as:  PAMELOR  Take 25 mg by mouth at bedtime.     oxyCODONE-acetaminophen 5-325 MG tablet  Commonly known as:  ROXICET  Take 1-2 tablets by mouth every 4 (four) hours as needed (Pain).     OXYGEN  Inhale 2 L/min into the lungs as needed.     potassium chloride 10 MEQ tablet  Commonly known as:  K-DUR  Take 1 tablet (10 mEq total) by mouth 2 (two) times daily.     pravastatin 80 MG tablet  Commonly known as:  PRAVACHOL  Take 1 tablet (80 mg total) by mouth every evening.     PROCEL Powd  Take 1 scoop by mouth 2 (two) times daily.     Vitamin  D3 1000 units Caps  Take 1 capsule by mouth daily.        Physical Exam: Filed Vitals:   11/18/15 1359  BP: 138/65  Pulse: 76  Temp: 98.1 F (36.7 C)  TempSrc: Oral  Resp: 18  Height: 5\' 6"  (1.676 m)  Weight: 249 lb 12.8 oz (113.309 kg)  SpO2:  94%   Body mass index is 40.34 kg/(m^2).   General- elderly female, obese, in no acute distress Head- atraumatic, normocephalic Eyes- PERRLA, EOMI, no pallor, no icterus, no discharge Neck- no lymphadenopathy Mouth- normal mucus membrane Cardiovascular- normal s1,s2, no murmur Respiratory- bilateral clear to auscultation, no wheeze, no rhonchi, no crackles Abdomen- bowel sounds present, soft, non tender Musculoskeletal- able to move all 4 extremities, generalized weakness, + leg edema Neurological- aaox 3 Skin- warm and dry   Labs reviewed: Basic Metabolic Panel:  Recent Labs  10/29/15 0418 11/02/15 0736 11/08/15 11/15/15 1115  NA 141 140 142 139  K 3.8 3.4* 3.8 3.2*  CL 95* 94*  --  98*  CO2 34* 32  --  31  GLUCOSE 120* 124*  --  130*  BUN 19 16 15 10   CREATININE 1.00 0.90 0.9 0.83  CALCIUM 9.0 9.1  --  10.0   Liver Function Tests:  Recent Labs  10/25/15 0050 10/26/15 0655  AST 20 19  ALT 13* 13*  ALKPHOS 42 46  BILITOT 0.6 0.9  PROT 6.2* 5.7*  ALBUMIN 3.5 3.2*   No results for input(s): LIPASE, AMYLASE in the last 8760 hours. No results for input(s): AMMONIA in the last 8760 hours. CBC:  Recent Labs  05/27/15 0855  10/26/15 1626 10/27/15 0325  10/30/15 [id number] 10/31/15 0629 11/08/15 11/15/15 1115  WBC 5.9  < > 11.7* 11.5*  < > 9.4 9.5 6.5 5.8  NEUTROABS 2.7  --  7.9* 7.1  --   --   --   --   --   HGB 12.4  < > 7.5* 7.3*  < > 7.4* 7.8* 9.0* 10.3*  HCT 39.4  < > 22.6* 22.8*  < > 24.6* 24.7* 30* 34.3*  MCV 98.0  < > 89.0 89.4  < > 93.5 93.9  --  97.4  PLT 228  < > 145* 166  < > 308 331 400* 299  < > = values in this interval not displayed.   Procedures and Imaging Studies During Stay: Ct Head Wo  Contrast  10/25/2015  CLINICAL DATA:  Fall on Coumadin. EXAM: CT HEAD WITHOUT CONTRAST CT CERVICAL SPINE WITHOUT CONTRAST TECHNIQUE: Multidetector CT imaging of the head and cervical spine was performed following the standard protocol without intravenous contrast. Multiplanar CT image reconstructions of the cervical spine were also generated. COMPARISON:  05/27/2015 FINDINGS: CT HEAD FINDINGS There is no intracranial hemorrhage, mass or evidence of acute infarction. There is moderate generalized atrophy. There is moderate chronic microvascular ischemic change. There is no significant extra-axial fluid collection. No acute intracranial findings are evident. The calvarium and skullbase are intact. The visible paranasal sinuses and orbits are unremarkable. CT CERVICAL SPINE FINDINGS The vertebral column, pedicles and facet articulations are intact. There is no evidence of acute fracture. No acute soft tissue abnormalities are evident. Mild arthritic changes are present. No bone lesion or bony destruction. IMPRESSION: 1. No acute intracranial findings. There is moderate generalized atrophy and chronic appearing white matter hypodensities which likely represent small vessel ischemic disease. 2. Negative for acute cervical spine fracture Electronically Signed   By: Andreas Newport M.D.   On: 10/25/2015 01:18   Ct Chest W Contrast  10/25/2015  CLINICAL DATA:  [place].  Anti coagulated. EXAM: CT CHEST, ABDOMEN, AND PELVIS WITH CONTRAST TECHNIQUE: Multidetector CT imaging of the chest, abdomen and pelvis was performed following the standard protocol during bolus administration of intravenous contrast. CONTRAST:  100 mL Isovue-300 intravenous COMPARISON:  None. FINDINGS: CT CHEST The lungs are clear except for minor chronic appearing posterior base linear opacities which likely represent scarring. There is no pneumothorax. There is no effusion. The airways are patent and intact. No intrathoracic vascular injury. Mediastinum  and hila appear unremarkable. No displaced fracture. CT ABDOMEN AND PELVIS There are normal intact appearances of the liver, spleen, pancreas, adrenals and kidneys. Mild diffuse fatty infiltration of the liver without focal lesion. Prior cholecystectomy. Bowel is unremarkable. There is no peritoneal blood or free air. The abdominal aorta is normal in caliber and intact. Prior hysterectomy.  No adnexal abnormalities. No acute fracture is evident. There is a hematoma of the subcutaneous tissues in the right flank region, measuring approximately 8 by 14 x 17 cm. There is a moderate volume vascular contrast extravasated into the hematoma, indicating active hemorrhage at the time of the scan. This hematoma is entirely superficial to the abdominal wall musculature. No significant intramuscular hemorrhage. There is no hemorrhage into the peritoneum or retroperitoneum. IMPRESSION: 1. Active hemorrhage into a large subcutaneous hematoma in the right flank region. The hematoma is confined to the subcutaneous tissues. No significant intramuscular component. No hemorrhage into the peritoneum or retroperitoneum. 2. No acute traumatic injury within the chest. No acute findings within the abdomen or pelvis. 3. Fatty liver. 4. These results were called by telephone at the time of interpretation on 10/25/2015 at 1:28 am to Dr. Everlene Balls , who verbally acknowledged these results. Electronically Signed   By: Andreas Newport M.D.   On: 10/25/2015 01:28   Ct Cervical Spine Wo Contrast  10/25/2015  CLINICAL DATA:  Fall on Coumadin. EXAM: CT HEAD WITHOUT CONTRAST CT CERVICAL SPINE WITHOUT CONTRAST TECHNIQUE: Multidetector CT imaging of the head and cervical spine was performed following the standard protocol without intravenous contrast. Multiplanar CT image reconstructions of the cervical spine were also generated. COMPARISON:  05/27/2015 FINDINGS: CT HEAD FINDINGS There is no intracranial hemorrhage, mass or evidence of acute  infarction. There is moderate generalized atrophy. There is moderate chronic microvascular ischemic change. There is no significant extra-axial fluid collection. No acute intracranial findings are evident. The calvarium and skullbase are intact. The visible paranasal sinuses and orbits are unremarkable. CT CERVICAL SPINE FINDINGS The vertebral column, pedicles and facet articulations are intact. There is no evidence of acute fracture. No acute soft tissue abnormalities are evident. Mild arthritic changes are present. No bone lesion or bony destruction. IMPRESSION: 1. No acute intracranial findings. There is moderate generalized atrophy and chronic appearing white matter hypodensities which likely represent small vessel ischemic disease. 2. Negative for acute cervical spine fracture Electronically Signed   By: Andreas Newport M.D.   On: 10/25/2015 01:18   Ct Abdomen Pelvis W Contrast  10/25/2015  CLINICAL DATA:  [place].  Anti coagulated. EXAM: CT CHEST, ABDOMEN, AND PELVIS WITH CONTRAST TECHNIQUE: Multidetector CT imaging of the chest, abdomen and pelvis was performed following the standard protocol during bolus administration of intravenous contrast. CONTRAST:  100 mL Isovue-300 intravenous COMPARISON:  None. FINDINGS: CT CHEST The lungs are clear except for minor chronic appearing posterior base linear opacities which likely represent scarring. There is no pneumothorax. There is no effusion. The airways are patent and intact. No intrathoracic vascular injury. Mediastinum and hila appear unremarkable. No displaced fracture. CT ABDOMEN AND PELVIS There are normal intact appearances of the liver, spleen, pancreas, adrenals and kidneys. Mild diffuse fatty infiltration of the liver without focal lesion. Prior cholecystectomy. Bowel is unremarkable. There is no peritoneal  blood or free air. The abdominal aorta is normal in caliber and intact. Prior hysterectomy.  No adnexal abnormalities. No acute fracture is evident.  There is a hematoma of the subcutaneous tissues in the right flank region, measuring approximately 8 by 14 x 17 cm. There is a moderate volume vascular contrast extravasated into the hematoma, indicating active hemorrhage at the time of the scan. This hematoma is entirely superficial to the abdominal wall musculature. No significant intramuscular hemorrhage. There is no hemorrhage into the peritoneum or retroperitoneum. IMPRESSION: 1. Active hemorrhage into a large subcutaneous hematoma in the right flank region. The hematoma is confined to the subcutaneous tissues. No significant intramuscular component. No hemorrhage into the peritoneum or retroperitoneum. 2. No acute traumatic injury within the chest. No acute findings within the abdomen or pelvis. 3. Fatty liver. 4. These results were called by telephone at the time of interpretation on 10/25/2015 at 1:28 am to Dr. Everlene Balls , who verbally acknowledged these results. Electronically Signed   By: Andreas Newport M.D.   On: 10/25/2015 01:28   Dg Pelvis Portable  10/25/2015  CLINICAL DATA:  [place] tonight.  Anti coagulated. EXAM: PORTABLE PELVIS 1-2 VIEWS COMPARISON:  None. FINDINGS: A single supine portable view of the pelvis is negative for displaced fracture. Pubic symphysis and sacroiliac joints appear intact. Hip joints appear intact. IMPRESSION: Negative. Electronically Signed   By: Andreas Newport M.D.   On: 10/25/2015 00:02   Dg Chest Port 1 View  11/01/2015  CLINICAL DATA:  Dyspnea this morning.  No chest pain. EXAM: PORTABLE CHEST 1 VIEW COMPARISON:  10/24/2015 FINDINGS: Cardiac silhouette is mildly enlarged. No mediastinal or hilar masses or evidence of adenopathy. Left anterior chest wall biventricular cardioverter-defibrillator is stable. Lungs are clear.  No pleural effusion or pneumothorax. IMPRESSION: No acute cardiopulmonary disease. Stable appearance from the recent prior exam. Electronically Signed   By: Lajean Manes M.D.   On: 11/01/2015  11:03   Dg Chest Port 1 View  10/25/2015  CLINICAL DATA:  Fall tonight. On Coumadin. Right pelvic hematoma. Shortness of breath. EXAM: PORTABLE CHEST 1 VIEW COMPARISON:  11/21/2012 FINDINGS: Low lung volumes are again demonstrated. Both lungs remain clear. No evidence of pneumothorax or pleural effusion. Cardiomegaly stable. AICD remains in appropriate position. IMPRESSION: Stable cardiomegaly and low lung volumes.  No acute findings. Electronically Signed   By: Earle Gell M.D.   On: 10/25/2015 00:03    Assessment/Plan:    Physical deconditioning She has made progress with PT and OT here. Will need to continue with PT and OT services at home. To provide RN services and nursing aid at home. Will need 3:1 bedside commode. Has rollator walker at home.   chf With recent chf exacerbation. Will need continuous o2 for now. Has oxygen at home. Will provide script for portable o2. Will need to wean off o2 as tolerated to 2l/min at bedtime. Continue lasix 40 mg daily and ted hose. Monitor weight at home. Continue kcl supplement. To follow up with cardiology  Blood loss anemia With right flank hematoma, this has subsided some but is present. On lab review, her hemoglobin level is low but improved. Will have her follow on this with her PCP to follow on cbc  hypokalemia Her lab work from cardiology office shows low K level. This has not been repleted. Currently on lasix and kcl. Will change her kcl to 20 meq bid for now and have her BMP checked on 11/21/15 to assess further  afib  Coumadin currently on hold. To follow with cardiology  Right flank hematoma Continue oxycodone-acetaminophen 5-325 mg 1-2 tab q4h prn pain and monitor   Patient is being discharged with the following home health services:  PT, OT, RN, CNA  Patient is being discharged with the following durable medical equipment:  3:1 bedside commode and o2. She has rollator walker at home  Patient has been advised to f/u with their PCP in  1-2 weeks to bring them up to date on their rehab stay.  Social services at facility was responsible for arranging this appointment.  Pt was provided with a 30 day supply of prescriptions for medications and refills must be obtained from their PCP.  For controlled substances, a more limited supply may be provided adequate until PCP appointment only.  Future labs/tests needed:  CBC, CMP    Blanchie Serve, MD Internal Medicine [facility] [address] [place], [place] [address] Cell Phone (Monday-Friday 8 am - 5 pm): [phone] On Call: [phone] and follow prompts after 5 pm and on weekends Office Phone: [phone] Office Fax: [phone]

## 2015-11-21 DIAGNOSIS — I5043 Acute on chronic combined systolic (congestive) and diastolic (congestive) heart failure: Secondary | ICD-10-CM | POA: Diagnosis not present

## 2015-11-21 DIAGNOSIS — I480 Paroxysmal atrial fibrillation: Secondary | ICD-10-CM | POA: Diagnosis not present

## 2015-11-24 ENCOUNTER — Inpatient Hospital Stay (HOSPITAL_COMMUNITY)
Admission: EM | Admit: 2015-11-24 | Discharge: 2015-11-29 | DRG: 291 | Disposition: A | Discharge status: Skilled Nursing Facility | Payer: PPO | Attending: Internal Medicine | Admitting: Internal Medicine

## 2015-11-24 ENCOUNTER — Other Ambulatory Visit: Payer: Self-pay

## 2015-11-24 ENCOUNTER — Encounter (HOSPITAL_COMMUNITY): Payer: Self-pay | Admitting: Emergency Medicine

## 2015-11-24 ENCOUNTER — Observation Stay (HOSPITAL_BASED_OUTPATIENT_CLINIC_OR_DEPARTMENT_OTHER)
Admit: 2015-11-24 | Discharge: 2015-11-24 | Disposition: A | Discharge status: Home / Self Care | Payer: PPO | Attending: Nurse Practitioner | Admitting: Nurse Practitioner

## 2015-11-24 ENCOUNTER — Emergency Department (HOSPITAL_COMMUNITY): Payer: PPO

## 2015-11-24 DIAGNOSIS — Z883 Allergy status to other anti-infective agents status: Secondary | ICD-10-CM | POA: Diagnosis not present

## 2015-11-24 DIAGNOSIS — I480 Paroxysmal atrial fibrillation: Secondary | ICD-10-CM | POA: Diagnosis present

## 2015-11-24 DIAGNOSIS — R0602 Shortness of breath: Secondary | ICD-10-CM | POA: Diagnosis not present

## 2015-11-24 DIAGNOSIS — R0600 Dyspnea, unspecified: Secondary | ICD-10-CM

## 2015-11-24 DIAGNOSIS — E785 Hyperlipidemia, unspecified: Secondary | ICD-10-CM | POA: Diagnosis not present

## 2015-11-24 DIAGNOSIS — E876 Hypokalemia: Secondary | ICD-10-CM | POA: Diagnosis not present

## 2015-11-24 DIAGNOSIS — D649 Anemia, unspecified: Secondary | ICD-10-CM | POA: Diagnosis not present

## 2015-11-24 DIAGNOSIS — R0902 Hypoxemia: Secondary | ICD-10-CM

## 2015-11-24 DIAGNOSIS — I5031 Acute diastolic (congestive) heart failure: Secondary | ICD-10-CM

## 2015-11-24 DIAGNOSIS — I428 Other cardiomyopathies: Secondary | ICD-10-CM | POA: Diagnosis not present

## 2015-11-24 DIAGNOSIS — I110 Hypertensive heart disease with heart failure: Principal | ICD-10-CM | POA: Diagnosis present

## 2015-11-24 DIAGNOSIS — R739 Hyperglycemia, unspecified: Secondary | ICD-10-CM | POA: Diagnosis present

## 2015-11-24 DIAGNOSIS — Z6839 Body mass index (BMI) 39.0-39.9, adult: Secondary | ICD-10-CM

## 2015-11-24 DIAGNOSIS — G629 Polyneuropathy, unspecified: Secondary | ICD-10-CM | POA: Diagnosis not present

## 2015-11-24 DIAGNOSIS — F329 Major depressive disorder, single episode, unspecified: Secondary | ICD-10-CM | POA: Diagnosis present

## 2015-11-24 DIAGNOSIS — Z91048 Other nonmedicinal substance allergy status: Secondary | ICD-10-CM

## 2015-11-24 DIAGNOSIS — I509 Heart failure, unspecified: Secondary | ICD-10-CM

## 2015-11-24 DIAGNOSIS — J9601 Acute respiratory failure with hypoxia: Secondary | ICD-10-CM | POA: Diagnosis present

## 2015-11-24 DIAGNOSIS — M1990 Unspecified osteoarthritis, unspecified site: Secondary | ICD-10-CM | POA: Diagnosis present

## 2015-11-24 DIAGNOSIS — R262 Difficulty in walking, not elsewhere classified: Secondary | ICD-10-CM | POA: Diagnosis not present

## 2015-11-24 DIAGNOSIS — Z87891 Personal history of nicotine dependence: Secondary | ICD-10-CM

## 2015-11-24 DIAGNOSIS — R0789 Other chest pain: Secondary | ICD-10-CM

## 2015-11-24 DIAGNOSIS — J9600 Acute respiratory failure, unspecified whether with hypoxia or hypercapnia: Secondary | ICD-10-CM

## 2015-11-24 DIAGNOSIS — Z95810 Presence of automatic (implantable) cardiac defibrillator: Secondary | ICD-10-CM | POA: Diagnosis not present

## 2015-11-24 DIAGNOSIS — I252 Old myocardial infarction: Secondary | ICD-10-CM | POA: Diagnosis not present

## 2015-11-24 DIAGNOSIS — G4730 Sleep apnea, unspecified: Secondary | ICD-10-CM | POA: Diagnosis present

## 2015-11-24 DIAGNOSIS — Z888 Allergy status to other drugs, medicaments and biological substances status: Secondary | ICD-10-CM

## 2015-11-24 DIAGNOSIS — R5381 Other malaise: Secondary | ICD-10-CM

## 2015-11-24 DIAGNOSIS — T501X5A Adverse effect of loop [high-ceiling] diuretics, initial encounter: Secondary | ICD-10-CM | POA: Diagnosis present

## 2015-11-24 DIAGNOSIS — R079 Chest pain, unspecified: Secondary | ICD-10-CM

## 2015-11-24 DIAGNOSIS — I5033 Acute on chronic diastolic (congestive) heart failure: Secondary | ICD-10-CM | POA: Diagnosis not present

## 2015-11-24 DIAGNOSIS — R2681 Unsteadiness on feet: Secondary | ICD-10-CM | POA: Diagnosis not present

## 2015-11-24 DIAGNOSIS — I10 Essential (primary) hypertension: Secondary | ICD-10-CM | POA: Diagnosis not present

## 2015-11-24 DIAGNOSIS — Z5189 Encounter for other specified aftercare: Secondary | ICD-10-CM | POA: Diagnosis not present

## 2015-11-24 DIAGNOSIS — Z8249 Family history of ischemic heart disease and other diseases of the circulatory system: Secondary | ICD-10-CM | POA: Diagnosis not present

## 2015-11-24 DIAGNOSIS — N3000 Acute cystitis without hematuria: Secondary | ICD-10-CM | POA: Diagnosis not present

## 2015-11-24 DIAGNOSIS — F32A Depression, unspecified: Secondary | ICD-10-CM

## 2015-11-24 DIAGNOSIS — I5043 Acute on chronic combined systolic (congestive) and diastolic (congestive) heart failure: Secondary | ICD-10-CM | POA: Diagnosis present

## 2015-11-24 DIAGNOSIS — M6281 Muscle weakness (generalized): Secondary | ICD-10-CM | POA: Diagnosis not present

## 2015-11-24 HISTORY — DX: Major depressive disorder, single episode, unspecified: F32.9

## 2015-11-24 HISTORY — DX: Depression, unspecified: F32.A

## 2015-11-24 LAB — BASIC METABOLIC PANEL
ANION GAP: 11 (ref 5–15)
BUN: 13 mg/dL (ref 6–20)
CALCIUM: 10.2 mg/dL (ref 8.9–10.3)
CO2: 26 mmol/L (ref 22–32)
CREATININE: 1.03 mg/dL — AB (ref 0.44–1.00)
Chloride: 101 mmol/L (ref 101–111)
GFR, EST AFRICAN AMERICAN: 57 mL/min — AB (ref >60)
GFR, EST NON AFRICAN AMERICAN: 50 mL/min — AB (ref >60)
GLUCOSE: 219 mg/dL — AB (ref 65–99)
Potassium: 4 mmol/L (ref 3.5–5.1)
Sodium: 138 mmol/L (ref 135–145)

## 2015-11-24 LAB — GLUCOSE, CAPILLARY
Glucose-Capillary: 111 mg/dL — ABNORMAL HIGH (ref 65–99)
Glucose-Capillary: 127 mg/dL — ABNORMAL HIGH (ref 65–99)

## 2015-11-24 LAB — CBC WITH DIFFERENTIAL/PLATELET
Basophils Absolute: 0 10*3/uL (ref 0.0–0.1)
Basophils Relative: 0 %
EOS PCT: 0 %
Eosinophils Absolute: 0 10*3/uL (ref 0.0–0.7)
HEMATOCRIT: 37.2 % (ref 36.0–46.0)
Hemoglobin: 11.2 g/dL — ABNORMAL LOW (ref 12.0–15.0)
LYMPHS PCT: 13 %
Lymphs Abs: 1.5 10*3/uL (ref 0.7–4.0)
MCH: 29 pg (ref 26.0–34.0)
MCHC: 30.1 g/dL (ref 30.0–36.0)
MCV: 96.4 fL (ref 78.0–100.0)
MONO ABS: 0.9 10*3/uL (ref 0.1–1.0)
MONOS PCT: 8 %
NEUTROS ABS: 9 10*3/uL — AB (ref 1.7–7.7)
Neutrophils Relative %: 79 %
PLATELETS: 213 10*3/uL (ref 150–400)
RBC: 3.86 MIL/uL — ABNORMAL LOW (ref 3.87–5.11)
RDW: 18.7 % — AB (ref 11.5–15.5)
WBC: 11.4 10*3/uL — ABNORMAL HIGH (ref 4.0–10.5)

## 2015-11-24 LAB — URINALYSIS, ROUTINE W REFLEX MICROSCOPIC
BILIRUBIN URINE: NEGATIVE
Glucose, UA: NEGATIVE mg/dL
Hgb urine dipstick: NEGATIVE
Ketones, ur: NEGATIVE mg/dL
NITRITE: NEGATIVE
PROTEIN: NEGATIVE mg/dL
SPECIFIC GRAVITY, URINE: 1.008 (ref 1.005–1.030)
pH: 6 (ref 5.0–8.0)

## 2015-11-24 LAB — TROPONIN I
TROPONIN I: 0.14 ng/mL — AB (ref <0.031)
TROPONIN I: 0.14 ng/mL — AB (ref <0.031)
Troponin I: 0.13 ng/mL — ABNORMAL HIGH (ref <0.031)

## 2015-11-24 LAB — BRAIN NATRIURETIC PEPTIDE: B NATRIURETIC PEPTIDE 5: 801.6 pg/mL — AB (ref 0.0–100.0)

## 2015-11-24 LAB — URINE MICROSCOPIC-ADD ON

## 2015-11-24 LAB — I-STAT TROPONIN, ED: Troponin i, poc: 0.04 ng/mL (ref 0.00–0.08)

## 2015-11-24 LAB — MRSA PCR SCREENING: MRSA by PCR: NEGATIVE

## 2015-11-24 MED ORDER — GABAPENTIN 400 MG PO CAPS
800.0000 mg | ORAL_CAPSULE | Freq: Three times a day (TID) | ORAL | Status: DC
  Administered 2015-11-24 – 2015-11-29 (×15): 800 mg via ORAL
  Filled 2015-11-24 (×16): qty 2

## 2015-11-24 MED ORDER — ONDANSETRON HCL 4 MG PO TABS: 4.0000 mg | ORAL_TABLET | Freq: Four times a day (QID) | ORAL | Status: DC | PRN

## 2015-11-24 MED ORDER — FUROSEMIDE 10 MG/ML IJ SOLN
40.0000 mg | INTRAMUSCULAR | Status: AC
  Administered 2015-11-24: 40 mg via INTRAVENOUS
  Filled 2015-11-24: qty 4

## 2015-11-24 MED ORDER — SODIUM CHLORIDE 0.9 % IV SOLN
INTRAVENOUS | Status: DC
  Administered 2015-11-24: 19:00:00 via INTRAVENOUS

## 2015-11-24 MED ORDER — OXYCODONE-ACETAMINOPHEN 5-325 MG PO TABS: 1.0000 | ORAL_TABLET | ORAL | Status: DC | PRN

## 2015-11-24 MED ORDER — ALLOPURINOL 100 MG PO TABS: 300.0000 mg | ORAL_TABLET | Freq: Every day | ORAL | Status: DC

## 2015-11-24 MED ORDER — POTASSIUM CHLORIDE ER 10 MEQ PO TBCR
10.0000 meq | EXTENDED_RELEASE_TABLET | Freq: Two times a day (BID) | ORAL | Status: DC
  Administered 2015-11-24 – 2015-11-29 (×10): 10 meq via ORAL
  Filled 2015-11-24 (×22): qty 1

## 2015-11-24 MED ORDER — NORTRIPTYLINE HCL 25 MG PO CAPS
25.0000 mg | ORAL_CAPSULE | Freq: Every day | ORAL | Status: DC
  Administered 2015-11-24 – 2015-11-28 (×5): 25 mg via ORAL
  Filled 2015-11-24 (×6): qty 1

## 2015-11-24 MED ORDER — ALLOPURINOL 300 MG PO TABS
400.0000 mg | ORAL_TABLET | Freq: Every day | ORAL | Status: DC
  Administered 2015-11-25 – 2015-11-29 (×5): 400 mg via ORAL
  Filled 2015-11-24 (×5): qty 1

## 2015-11-24 MED ORDER — ENOXAPARIN SODIUM 40 MG/0.4ML ~~LOC~~ SOLN: 40.0000 mg | SUBCUTANEOUS | Status: DC

## 2015-11-24 MED ORDER — COLESTIPOL HCL 1 G PO TABS
2.0000 g | ORAL_TABLET | Freq: Every day | ORAL | Status: DC
  Administered 2015-11-25 – 2015-11-29 (×6): 2 g via ORAL
  Filled 2015-11-24 (×6): qty 2

## 2015-11-24 MED ORDER — ONDANSETRON HCL 4 MG/2ML IJ SOLN: 4.0000 mg | Freq: Four times a day (QID) | INTRAMUSCULAR | Status: DC | PRN

## 2015-11-24 MED ORDER — ENOXAPARIN SODIUM 40 MG/0.4ML ~~LOC~~ SOLN
40.0000 mg | SUBCUTANEOUS | Status: DC
  Administered 2015-11-25 – 2015-11-28 (×4): 40 mg via SUBCUTANEOUS
  Filled 2015-11-24 (×5): qty 0.4

## 2015-11-24 MED ORDER — ASPIRIN 81 MG PO CHEW
324.0000 mg | CHEWABLE_TABLET | Freq: Once | ORAL | Status: AC
  Administered 2015-11-24: 324 mg via ORAL
  Filled 2015-11-24: qty 4

## 2015-11-24 MED ORDER — PRAVASTATIN SODIUM 40 MG PO TABS
80.0000 mg | ORAL_TABLET | Freq: Every evening | ORAL | Status: DC
  Administered 2015-11-24 – 2015-11-28 (×5): 80 mg via ORAL
  Filled 2015-11-24 (×5): qty 2

## 2015-11-24 MED ORDER — FUROSEMIDE 10 MG/ML IJ SOLN
60.0000 mg | Freq: Two times a day (BID) | INTRAMUSCULAR | Status: DC
  Administered 2015-11-24 – 2015-11-28 (×9): 60 mg via INTRAVENOUS
  Filled 2015-11-24 (×9): qty 6

## 2015-11-24 MED ORDER — GI COCKTAIL ~~LOC~~: 30.0000 mL | Freq: Four times a day (QID) | ORAL | Status: DC | PRN

## 2015-11-24 MED ORDER — ALBUTEROL SULFATE (2.5 MG/3ML) 0.083% IN NEBU
5.0000 mg | INHALATION_SOLUTION | Freq: Once | RESPIRATORY_TRACT | Status: AC
  Administered 2015-11-24: 5 mg via RESPIRATORY_TRACT
  Filled 2015-11-24: qty 6

## 2015-11-24 MED ORDER — FUROSEMIDE 10 MG/ML IJ SOLN: 40.0000 mg | Freq: Two times a day (BID) | INTRAMUSCULAR | Status: DC

## 2015-11-24 MED ORDER — GABAPENTIN 800 MG PO TABS
800.0000 mg | ORAL_TABLET | Freq: Three times a day (TID) | ORAL | Status: DC
  Filled 2015-11-24: qty 1

## 2015-11-24 MED ORDER — SODIUM CHLORIDE 0.9% FLUSH
3.0000 mL | Freq: Two times a day (BID) | INTRAVENOUS | Status: DC
  Administered 2015-11-25 – 2015-11-29 (×9): 3 mL via INTRAVENOUS

## 2015-11-24 NOTE — ED Notes (Signed)
MD at bedside. 

## 2015-11-24 NOTE — Progress Notes (Signed)
Patient came in on CPAP. Patient having chest pain and sob. RT placed patient on BIPAP 12/6 and 60%. Patient tolerating well. RT will continue to monitor

## 2015-11-24 NOTE — ED Provider Notes (Signed)
CSN: [id number]     Arrival date & time 11/24/15  0206 History  By signing my name below, I, Emmanuella Mensah, attest that this documentation has been prepared under the direction and in the presence of Sharlett Iles, MD. Electronically Signed: Judithann Sauger, ED Scribe. 11/24/2015. 2:44 AM.    Chief Complaint  Patient presents with  . Shortness of Breath  . Chest Pain   The history is provided by the patient. No language interpreter was used.   HPI Comments: Level 5 Caveat due to respiratory distress.  Bianca [name] is a 82 y.o. female with a hx of HTN, CHF, MI, sCHF, and ICD who presents to the Emergency Department by ambulance from [facility] complaining of sudden onset of chest pain and ongoing SOB onset today. Pt receive 2 nitro en route but reports no significant relief of SOB, CP has resolved. She has been in rehab at [facility] for the past 20 days and is due to be released later today. Pt has been off her O2 for approx one week except at night with her C-pap machine. Pt's family denies a hx of COPD. Pt is supposed to be on Coumadin but pt has not been on Coumadin since being at [facility].    Past Medical History  Diagnosis Date  . Pacemaker   . ICD (implantable cardiac defibrillator) in place   . Myocardial infarction ([place]) 2008  . Sleep apnea   . Arthritis   . Neuromuscular disorder ([place])   . CHF (congestive heart failure) ([place])   . Hypertension   . Dysrhythmia   . Chronic systolic heart failure ([place])   . Acute systolic heart failure ([place])   . Acute blood loss anemia    Past Surgical History  Procedure Laterality Date  . Cholecystectomy    . Abdominal hysterectomy    . Ankle surgery    . Insert / replace / remove pacemaker  2008  . Knee ligament reconstruction  2012  . Tonsillectomy    . Cardiac catheterization    . Eye surgery     Family History  Problem Relation Age of Onset  . Tuberculosis Father   . Stroke Mother   . Congestive Heart  Failure Mother   . Diabetes Brother   . Stroke Brother   . Cancer Brother   . Heart attack Son    Social History  Substance Use Topics  . Smoking status: Former Research scientist (life sciences)  . Smokeless tobacco: Former Systems developer  . Alcohol Use: No   OB History    No data available     Review of Systems  Unable to perform ROS: Severe respiratory distress       Allergies  Quinapril hcl; Tape; and Neosporin  Home Medications   Prior to Admission medications   Medication Sig Start Date End Date Taking? Authorizing Provider  allopurinol (ZYLOPRIM) 100 MG tablet Take 100 mg by mouth daily. Takes along with a 300 mg tablet to equal 400 mg    Historical Provider, MD  allopurinol (ZYLOPRIM) 300 MG tablet Take 300 mg by mouth daily. Takes along with a 100mg  tablet to equal 400mg  12/09/11   Historical Provider, MD  Calcium Carbonate-Vitamin D (CALCIUM 600+D) 600-400 MG-UNIT per tablet Take 1 tablet by mouth 2 (two) times daily.    Historical Provider, MD  Cholecalciferol (VITAMIN D3) 1000 UNITS CAPS Take 1 capsule by mouth daily.    Historical Provider, MD  colestipol (COLESTID) 1 G tablet Take 2 g by  mouth daily.     Historical Provider, MD  furosemide (LASIX) 40 MG tablet Take 1 tablet (40 mg total) by mouth daily. 11/03/15   Lisette Abu, PA-C  gabapentin (NEURONTIN) 800 MG tablet Take 800 mg by mouth 3 (three) times daily.     Historical Provider, MD  Multiple Vitamin (MULTIVITAMIN) tablet Take 1 tablet by mouth daily.    Historical Provider, MD  nitroGLYCERIN (NITROSTAT) 0.4 MG SL tablet Place 1 tablet (0.4 mg total) under the tongue every 5 (five) minutes as needed for chest pain. 11/24/12   Jettie Booze, MD  nortriptyline (PAMELOR) 25 MG capsule Take 25 mg by mouth at bedtime.    Historical Provider, MD  oxyCODONE-acetaminophen (ROXICET) 5-325 MG tablet Take 1-2 tablets by mouth every 4 (four) hours as needed (Pain). 11/03/15   Lisette Abu, PA-C  OXYGEN Inhale 2 L/min into the lungs as needed.     Historical Provider, MD  potassium chloride (K-DUR) 10 MEQ tablet Take 1 tablet (10 mEq total) by mouth 2 (two) times daily. 11/03/15   Lisette Abu, PA-C  pravastatin (PRAVACHOL) 80 MG tablet Take 1 tablet (80 mg total) by mouth every evening. 08/18/15   Jerline Pain, MD  Protein (PROCEL) POWD Take 1 scoop by mouth 2 (two) times daily.    Historical Provider, MD   BP 122/85 mmHg  Pulse 109  Temp(Src) 98.1 F (36.7 C) (Oral)  Resp 20  Ht 5\' 5"  (1.651 m)  Wt 248 lb (112.492 kg)  BMI 41.27 kg/m2  SpO2 99% Physical Exam  Constitutional: She is oriented to person, place, and time. She appears well-developed and well-nourished. No distress.  Tachypneic   HENT:  Head: Normocephalic and atraumatic.  Moist mucous membranes  Eyes: Conjunctivae are normal. Pupils are equal, round, and reactive to light.  Neck: Neck supple.  Cardiovascular: Regular rhythm and normal heart sounds.  Tachycardia present.   No murmur heard. Pulmonary/Chest: Tachypnea noted. No respiratory distress.  Mildly increased work of breathing of BiPAP. Diminished breath sounds bilateral bases.   Abdominal: Soft. Bowel sounds are normal. She exhibits no distension. There is no tenderness.  Musculoskeletal: She exhibits edema.  1+ pitting edema BLE  Neurological: She is alert and oriented to person, place, and time.  Fluent speech  Skin: Skin is warm and dry.  Healing hematoma to right flank   Psychiatric: She has a normal mood and affect. Judgment normal.  Nursing note and vitals reviewed.   ED Course  .Critical Care Performed by: Sharlett Iles Authorized by: Sharlett Iles Total critical care time: 45 minutes Critical care was necessary to treat or prevent imminent or life-threatening deterioration of the following conditions: respiratory failure. Critical care was time spent personally by me on the following activities: development of treatment plan with patient or surrogate, evaluation of  patient's response to treatment, examination of patient, obtaining history from patient or surrogate, ordering and performing treatments and interventions, ordering and review of laboratory studies, ordering and review of radiographic studies, pulse oximetry, re-evaluation of patient's condition and review of old charts.   (including critical care time) DIAGNOSTIC STUDIES: Oxygen Saturation is 94% on RA, adequate by my interpretation.    COORDINATION OF CARE: 2:40 AM- Pt advised of plan for treatment and pt agrees. Pt will receive chest x-ray and lab work for further evaluation. Pt is currently receiving Proventil.    Labs Review Labs Reviewed  BASIC METABOLIC PANEL - Abnormal; Notable for the following:  Glucose, Bld 219 (*)    Creatinine, Ser 1.03 (*)    GFR calc non Af Amer 50 (*)    GFR calc Af Amer 57 (*)    All other components within normal limits  BRAIN NATRIURETIC PEPTIDE - Abnormal; Notable for the following:    B Natriuretic Peptide 801.6 (*)    All other components within normal limits  CBC WITH DIFFERENTIAL/PLATELET - Abnormal; Notable for the following:    WBC 11.4 (*)    RBC 3.86 (*)    Hemoglobin 11.2 (*)    RDW 18.7 (*)    Neutro Abs 9.0 (*)    All other components within normal limits  URINE CULTURE  URINALYSIS, ROUTINE W REFLEX MICROSCOPIC (NOT AT [facility])  Randolm Idol, ED    Imaging Review Dg Chest Port 1 View  11/24/2015  CLINICAL DATA:  Initial evaluation for acute chest pain and shortness of breath. EXAM: PORTABLE CHEST 1 VIEW COMPARISON:  Prior radiograph from 11/01/2015. FINDINGS: Left-sided transvenous pacemaker/ AICD in place, stable. Cardiomegaly unchanged. Mediastinal silhouette within normal limits. Lungs are hypoinflated. Moderate diffuse pulmonary edema present. Evaluation limited for pleural effusion. No definite focal infiltrates. No pneumothorax. No acute osseous abnormality. IMPRESSION: Cardiomegaly with moderate diffuse pulmonary edema,  consistent with acute CHF exacerbation. Electronically Signed   By: Jeannine Boga M.D.   On: 11/24/2015 02:42     Sharlett Iles, MD has personally reviewed and evaluated these images and lab results as part of her medical decision-making.   EKG Interpretation   Date/Time:  Thursday November 24 2015 02:11:30 EDT Ventricular Rate:  129 PR Interval:  223 QRS Duration: 180 QT Interval:  401 QTC Calculation: 587 R Axis:   -121 Text Interpretation:  Age not entered, assumed to be  82 years old for  purpose of ECG interpretation Sinus tachycardia Multiform ventricular  premature complexes Prolonged PR interval IVCD, consider atypical RBBB  Inferior infarct, old Anterior infarct, old Baseline wander in lead(s) V2  V3 V4 tachycardia new from previous Confirmed by Jari Carollo MD, Colt Martelle [phone])  on 11/24/2015 2:27:17 AM     Medications  albuterol (PROVENTIL) (2.5 MG/3ML) 0.083% nebulizer solution 5 mg (5 mg Nebulization Given 11/24/15 0302)  furosemide (LASIX) injection 40 mg (40 mg Intravenous Given 11/24/15 0307)  aspirin chewable tablet 324 mg (324 mg Oral Given 11/24/15 0306)    MDM   Final diagnoses:  Acute on chronic congestive heart failure, unspecified congestive heart failure type ([place])  Hypoxia    Patient presents from nursing facility with sudden shortness of breath associated with chest pain, chest pain resolved with nitroglycerin by EMS but shortness of breath continued. On arrival, she was dyspneic w/ increased WOB, mentating appropriately. VS notable for HR near 130, BP 107/91, O2 sat mid 90s on BiPAP. The patient denied any chest pain. Diminished breath sounds bilaterally. Portable chest x-ray shows cardiomegaly with effusion pulmonary edema. Lab work also suggests heart failure exacerbation with BNP 800, creatinine 1.03. EKG without acute ischemic changes and troponin normal. Gave the patient 40 mg IV Lasix. On reexamination several hours after arrival, the patient was able to  transition to nasal cannula and was breathing much more comfortably. I discussed admission with [facility] hospitalist, Dr. Tamala Julian, who will admit the patient for diuresis.  I personally performed the services described in this documentation, which was scribed in my presence. The recorded information has been reviewed and is accurate.   Sharlett Iles, MD 11/24/15 [phone]

## 2015-11-24 NOTE — Consult Note (Signed)
CARDIOLOGY CONSULT NOTE   Patient ID: Bianca [name] MRN: [id number] DOB/AGE: [birth year] 82 y.o.  Admit date: 11/24/2015  Primary Physician   Jonathon Bellows, MD Primary Cardiologist: Dr. Marlou Porch Requesting MD: Dr. Marily Memos Reason for Consultation: CHF  HPI: Bianca [name] is a 82 year old female with a past medical history of HTN,PAF, chronic systolic CHF, NICM s/p BiV ICD.   Patient presented to the emergency room on 11/24/2015 with complaints of sudden onset chest pain and ongoing shortness of breath. Her chest pain resolved with sublingual nitroglycerin. She has been residing at [facility] for rehabilitation after having a fall at home that resulted in her right flank hematoma and some acute blood loss anemia. This morning while at [facility] she began to feel suddenly short of breath and notified the nurse. She required additional supplemental O2 and was eventually placed on her CPAP and EMS was called for hypoxia.  Upon arrival to the emergency room she required BiPAP. Her chest x-ray shows cardiomegaly with moderate diffuse pulmonary edema consistent with CHF exacerbation. Her BNP is elevated at 801.6. She has been on 40 mg of Lasix daily at the rehabilitation facility. Her weight today is 244 pounds. She was seen by the advanced heart failure clinic on 11/15/2015 at which time her weight was 250 pounds. She was given 60 mg of IV Lasix in the emergency room. She has diuresed 1.2 L.  Her last echo was in May 2017, her EF was 50-55%. She had hypokinesis of the lateral and inferior lateral myocardium. She does have some elevated ventricular filling pressures. Her left atrium is severely dilated.    Past Medical History  Diagnosis Date  . Pacemaker   . ICD (implantable cardiac defibrillator) in place   . Myocardial infarction ([place]) 2008  . Sleep apnea   . Arthritis   . Neuromuscular disorder ([place])   . CHF (congestive heart failure) ([place])   . Hypertension   . Dysrhythmia   .  Chronic systolic heart failure ([place])   . Acute systolic heart failure ([place])   . Acute blood loss anemia   . Depression 11/24/2015     Past Surgical History  Procedure Laterality Date  . Cholecystectomy    . Abdominal hysterectomy    . Ankle surgery    . Insert / replace / remove pacemaker  2008  . Knee ligament reconstruction  2012  . Tonsillectomy    . Cardiac catheterization    . Eye surgery      Allergies  Allergen Reactions  . Quinapril Hcl Swelling    Tongue and throat  . Tape     Plastic tape causes irritation.   . Neosporin [Neomycin-Polymyxin-Gramicidin] Rash    I have reviewed the patient's current medications . allopurinol  400 mg Oral Daily  . colestipol  2 g Oral Daily  . enoxaparin (LOVENOX) injection  40 mg Subcutaneous Q24H  . furosemide  60 mg Intravenous BID  . gabapentin  800 mg Oral TID  . nortriptyline  25 mg Oral QHS  . potassium chloride  10 mEq Oral BID  . pravastatin  80 mg Oral QPM  . sodium chloride flush  3 mL Intravenous Q12H   . sodium chloride     gi cocktail, ondansetron **OR** ondansetron (ZOFRAN) IV, oxyCODONE-acetaminophen  Prior to Admission medications   Medication Sig Start Date End Date Taking? Authorizing Provider  acetaminophen (TYLENOL) 325 MG tablet Take 650 mg by mouth 2 (two) times daily.  Yes Historical Provider, MD  allopurinol (ZYLOPRIM) 100 MG tablet Take 100 mg by mouth daily. Takes along with a 300 mg tablet to equal 400 mg   Yes Historical Provider, MD  allopurinol (ZYLOPRIM) 300 MG tablet Take 300 mg by mouth daily. Takes along with a 100mg  tablet to equal 400mg  12/09/11  Yes Historical Provider, MD  Calcium Carbonate-Vitamin D (CALCIUM 600+D) 600-400 MG-UNIT per tablet Take 1 tablet by mouth 2 (two) times daily.   Yes Historical Provider, MD  Cholecalciferol (VITAMIN D3) 1000 UNITS CAPS Take 1 capsule by mouth daily.   Yes Historical Provider, MD  colestipol (COLESTID) 1 G tablet Take 2 g by mouth daily.    Yes  Historical Provider, MD  furosemide (LASIX) 40 MG tablet Take 1 tablet (40 mg total) by mouth daily. 11/03/15  Yes Lisette Abu, PA-C  gabapentin (NEURONTIN) 800 MG tablet Take 800 mg by mouth 3 (three) times daily.    Yes Historical Provider, MD  Multiple Vitamin (MULTIVITAMIN) tablet Take 1 tablet by mouth daily.   Yes Historical Provider, MD  nitroGLYCERIN (NITROSTAT) 0.4 MG SL tablet Place 1 tablet (0.4 mg total) under the tongue every 5 (five) minutes as needed for chest pain. 11/24/12  Yes Jettie Booze, MD  nortriptyline (PAMELOR) 25 MG capsule Take 25 mg by mouth at bedtime.   Yes Historical Provider, MD  oxyCODONE-acetaminophen (ROXICET) 5-325 MG tablet Take 1-2 tablets by mouth every 4 (four) hours as needed (Pain). 11/03/15  Yes Lisette Abu, PA-C  OXYGEN Inhale 2 L/min into the lungs as needed.   Yes Historical Provider, MD  potassium chloride (K-DUR) 10 MEQ tablet Take 1 tablet (10 mEq total) by mouth 2 (two) times daily. 11/03/15  Yes Lisette Abu, PA-C  pravastatin (PRAVACHOL) 80 MG tablet Take 1 tablet (80 mg total) by mouth every evening. 08/18/15  Yes Jerline Pain, MD  Protein (PROCEL) POWD Take 1 scoop by mouth 2 (two) times daily.   Yes Historical Provider, MD     Social History   Social History  . Marital Status: Widowed    Spouse Name: N/A  . Number of Children: N/A  . Years of Education: N/A   Occupational History  . Not on file.   Social History Main Topics  . Smoking status: Former Research scientist (life sciences)  . Smokeless tobacco: Former Systems developer  . Alcohol Use: No  . Drug Use: No  . Sexual Activity: Not Currently   Other Topics Concern  . Not on file   Social History Narrative    Family Status  Relation Status Death Age  . Mother Deceased   . Father Deceased   . Sister Deceased 77  . Brother Deceased   . Brother Deceased   . Son Alive   . Daughter Alive   . Son Alive   . Maternal Grandmother Deceased   . Maternal Grandfather Deceased   . Paternal  Grandmother Deceased   . Paternal Grandfather Deceased    Family History  Problem Relation Age of Onset  . Tuberculosis Father   . Stroke Mother   . Congestive Heart Failure Mother   . Diabetes Brother   . Stroke Brother   . Cancer Brother   . Heart attack Son      ROS:  Full 14 point review of systems complete and found to be negative unless listed above.  Physical Exam: Blood pressure 140/89, pulse 110, temperature 97.9 F (36.6 C), temperature source Oral, resp. rate 20, height  5\' 6"  (1.676 m), weight 244 lb 8 oz (110.904 kg), SpO2 94 %.  General: Well developed, well nourished,obese female in no acute distress Head: Eyes PERRLA, No xanthomas.   Normocephalic and atraumatic, oropharynx without edema or exudate.  Lungs: Crackles in bilateral lower lobes. Heart: HRRR S1 S2, no rub/gallop, Heart irregular rate and rhythm with S1, S2  murmur. pulses are 2+ extrem.   Neck: No carotid bruits. No lymphadenopathy.  JVD. Abdomen: Bowel sounds present, abdomen soft and non-tender without masses or hernias noted. Msk:  No spine or cva tenderness. No weakness, no joint deformities or effusions. Extremities: No clubbing or cyanosis.  edema.  Neuro: Alert and oriented X 3. No focal deficits noted. Psych:  Good affect, responds appropriately Skin: No rashes or lesions noted.  Labs:   Lab Results  Component Value Date   WBC 11.4* 11/24/2015   HGB 11.2* 11/24/2015   HCT 37.2 11/24/2015   MCV 96.4 11/24/2015   PLT 213 11/24/2015   No results for input(s): INR in the last 72 hours.  Recent Labs Lab 11/24/15 0215  NA 138  K 4.0  CL 101  CO2 26  BUN 13  CREATININE 1.03*  CALCIUM 10.2  GLUCOSE 219*   MAGNESIUM  Date Value Ref Range Status  11/22/2012 2.3 1.5 - 2.5 mg/dL Final    Recent Labs  11/24/15 0840 11/24/15 1136  TROPONINI 0.14* 0.13*    Recent Labs  11/24/15 0236  TROPIPOC 0.04   B NATRIURETIC PEPTIDE  Date/Time Value Ref Range Status  11/24/2015 02:49 AM  801.6* 0.0 - 100.0 pg/mL Final  11/02/2015 07:36 AM 143.9* 0.0 - 100.0 pg/mL Final    Echo: 10/26/15  Study Conclusions  - Left ventricle: The cavity size was moderately dilated. There was  mild concentric hypertrophy. Systolic function was at the lower  limits of normal. The estimated ejection fraction was in the  range of 50% to 55%. Hypokinesis of the lateral and inferolateral  myocardium. Doppler parameters are consistent with abnormal left  ventricular relaxation (grade 1 diastolic dysfunction). Doppler  parameters are consistent with high ventricular filling pressure. - Aortic valve: Transvalvular velocity was within the normal range.  There was no stenosis. There was no regurgitation. - Mitral valve: Calcified annulus. Transvalvular velocity was  within the normal range. There was no evidence for stenosis.  There was mild regurgitation. - Left atrium: The atrium was severely dilated. - Right ventricle: The cavity size was normal. Wall thickness was  normal. Systolic function was normal. - Inferior vena cava: The vessel was normal in size. The  respirophasic diameter changes were in the normal range (>= 50%),  consistent with normal central venous pressure.  ECG:  Vpaced  Radiology:  Dg Chest Port 1 View  11/24/2015  CLINICAL DATA:  Initial evaluation for acute chest pain and shortness of breath. EXAM: PORTABLE CHEST 1 VIEW COMPARISON:  Prior radiograph from 11/01/2015. FINDINGS: Left-sided transvenous pacemaker/ AICD in place, stable. Cardiomegaly unchanged. Mediastinal silhouette within normal limits. Lungs are hypoinflated. Moderate diffuse pulmonary edema present. Evaluation limited for pleural effusion. No definite focal infiltrates. No pneumothorax. No acute osseous abnormality. IMPRESSION: Cardiomegaly with moderate diffuse pulmonary edema, consistent with acute CHF exacerbation. Electronically Signed   By: Jeannine Boga M.D.   On: 11/24/2015 02:42     ASSESSMENT AND PLAN:    Active Problems:   Paroxysmal atrial fibrillation (HCC)   CHF exacerbation (HCC)   Acute diastolic (congestive) heart failure (HCC)   Acute respiratory failure (  [place])   Physical deconditioning   Hyperglycemia   Peripheral neuropathy (HCC)   HLD (hyperlipidemia)   Depression   Chest pain  1. Acute on chronic diastolic CHF: Patient presents with CHF exacerbation. Her BNP is elevated and chest x-ray shows pulmonary edema. She required short-term BiPAP currently on supplemental O2 via nasal cannula. She is received one dose of IV Lasix and diuresed 1.2 L. We will continue IV diuresis and follow her weights. Currently creatinine is 1.03, will follow. Troponin is mildly elevated at 0.13 in the setting of CHF exacerbation. No need for repeat echo as she had one less than a month ago.  She had a left heart cath in May of 2012 that showed no CAD. EF at that time was 25-30%. EF has since improved to 50-55% per Echo last month.   2. PAF: Patient was on Coumadin before she had her fall that resulted in severe right flank hematoma in May of 2017. She was hospitalized and required FFP to reverse her INR and PRBC's for blood loss anemia. Coumadin is currently on hold. MD to advise regarding restarting anti coagulation.   3. HLD: On high intensity statin, continue same.  4. HTN: Hypertensive. Would add Coreg 3.125 mg as she is tachycardic as well.      Signed: Arbutus Leas, NP 11/24/2015 3:18 PM Pager [phone]  Co-Sign MD  Personally seen and examined. Agree with above.  82 year old with acute congestive heart failure, atrial fibrillation, ICD, nonischemic cardiomyopathy with recent fall resulting in large flank hematoma requiring reversal of Coumadin and blood products.  She was recently seen in the heart failure clinic who saw her during the hospitalization surrounding her fall.  Agree that lungs demonstrate crackles at bases, heart is irregular and mildly  tachycardic.  We will continue with IV Lasix, responded well to 60 mg IV in the emergency room. Just recently, she tried to lay down but was feeling short winded, orthopneic.  Continue to hold Coumadin. She is still resolving hematoma in her back. She is quite unsteady on her feet. She mentioned to me that she would like to consider potentially Eliquis in the future.  Ejection fraction had improved from 35% up to 50% recently.  Candee Furbish, MD

## 2015-11-24 NOTE — H&P (Addendum)
History and Physical    Bianca [name] [id number] DOB: 1933-01-30 DOA: 11/24/2015  PCP: Jonathon Bellows, MD Patient coming from: [facility], nursing home  Chief Complaint: Chest pain, shortness of breath  HPI: Bianca [name] is a 82 y.o. female with medical history significant of MI, CHF, HTN, anemia status post pacemaker/defibrilator placement presenting after acute onset SOB and CP. Of note patient has been a resident at [facility] for the last 20 days. Patient was transferred there after exerting admission at [facility] for fall, anemia and development of a large hematoma with subsequent physical deconditioning requiring transfer to rehabilitation facility. Patient did well at her rehabilitation facility and was set for discharge on 11/24/2015, Emile Kyllo admission to [facility]. Additionally patient had an O2 requirement throughout her last hospitalization and it [facility] in the last few days when she was weaned off of oxygen and was room air. Patient reports getting up around 12:30 at night to go use the restroom. In so doing she had sudden onset chest pain and shortness of breath. She is evaluated by [facility] staff and was found to have low oxygen levels and called EMS for transfer to [facility]. Symptoms are constant, improved by BiPAP, worsened by exertion. Denies any recent worsening of lower extremity edema, cough, fevers, palpitations, nausea, vomiting. She has been off for blood thinner since the initial fall.   ED Course: Patient on BiPAP initially in the ED. Given 20 mg of IV Lasix, nitroglycerin and aspirin. A few hours later patient was given for 60 more milligrams of Lasix. Patient weaned off of BiPAP and put onto nasal cannula 4 L. Patient sleeping comfortably at time of evaluation.  Review of Systems: As per HPI otherwise 10 point review of systems negative.   Ambulatory Status: Overall patient weak but able to ambulate on her own. See history of  present illness for further details regarding physical abilities and ambulatory status.  Past Medical History  Diagnosis Date  . Pacemaker   . ICD (implantable cardiac defibrillator) in place   . Myocardial infarction ([place]) 2008  . Sleep apnea   . Arthritis   . Neuromuscular disorder ([place])   . CHF (congestive heart failure) ([place])   . Hypertension   . Dysrhythmia   . Chronic systolic heart failure ([place])   . Acute systolic heart failure ([place])   . Acute blood loss anemia   . Depression 11/24/2015    Past Surgical History  Procedure Laterality Date  . Cholecystectomy    . Abdominal hysterectomy    . Ankle surgery    . Insert / replace / remove pacemaker  2008  . Knee ligament reconstruction  2012  . Tonsillectomy    . Cardiac catheterization    . Eye surgery      Social History   Social History  . Marital Status: Widowed    Spouse Name: N/A  . Number of Children: N/A  . Years of Education: N/A   Occupational History  . Not on file.   Social History Main Topics  . Smoking status: Former Research scientist (life sciences)  . Smokeless tobacco: Former Systems developer  . Alcohol Use: No  . Drug Use: No  . Sexual Activity: Not Currently   Other Topics Concern  . Not on file   Social History Narrative    Allergies  Allergen Reactions  . Quinapril Hcl Swelling    Tongue and throat  . Tape     Plastic tape causes irritation.   Marland Kitchen  Neosporin [Neomycin-Polymyxin-Gramicidin] Rash    Family History  Problem Relation Age of Onset  . Tuberculosis Father   . Stroke Mother   . Congestive Heart Failure Mother   . Diabetes Brother   . Stroke Brother   . Cancer Brother   . Heart attack Son     Prior to Admission medications   Medication Sig Start Date End Date Taking? Authorizing Provider  allopurinol (ZYLOPRIM) 100 MG tablet Take 100 mg by mouth daily. Takes along with a 300 mg tablet to equal 400 mg    Historical Provider, MD  allopurinol (ZYLOPRIM) 300 MG tablet Take 300 mg by mouth daily. Takes along  with a 100mg  tablet to equal 400mg  12/09/11   Historical Provider, MD  Calcium Carbonate-Vitamin D (CALCIUM 600+D) 600-400 MG-UNIT per tablet Take 1 tablet by mouth 2 (two) times daily.    Historical Provider, MD  Cholecalciferol (VITAMIN D3) 1000 UNITS CAPS Take 1 capsule by mouth daily.    Historical Provider, MD  colestipol (COLESTID) 1 G tablet Take 2 g by mouth daily.     Historical Provider, MD  furosemide (LASIX) 40 MG tablet Take 1 tablet (40 mg total) by mouth daily. 11/03/15   Lisette Abu, PA-C  gabapentin (NEURONTIN) 800 MG tablet Take 800 mg by mouth 3 (three) times daily.     Historical Provider, MD  Multiple Vitamin (MULTIVITAMIN) tablet Take 1 tablet by mouth daily.    Historical Provider, MD  nitroGLYCERIN (NITROSTAT) 0.4 MG SL tablet Place 1 tablet (0.4 mg total) under the tongue every 5 (five) minutes as needed for chest pain. 11/24/12   Jettie Booze, MD  nortriptyline (PAMELOR) 25 MG capsule Take 25 mg by mouth at bedtime.    Historical Provider, MD  oxyCODONE-acetaminophen (ROXICET) 5-325 MG tablet Take 1-2 tablets by mouth every 4 (four) hours as needed (Pain). 11/03/15   Lisette Abu, PA-C  OXYGEN Inhale 2 L/min into the lungs as needed.    Historical Provider, MD  potassium chloride (K-DUR) 10 MEQ tablet Take 1 tablet (10 mEq total) by mouth 2 (two) times daily. 11/03/15   Lisette Abu, PA-C  pravastatin (PRAVACHOL) 80 MG tablet Take 1 tablet (80 mg total) by mouth every evening. 08/18/15   Jerline Pain, MD  Protein (PROCEL) POWD Take 1 scoop by mouth 2 (two) times daily.    Historical Provider, MD    Physical Exam: Filed Vitals:   11/24/15 0700 11/24/15 0730 11/24/15 0734 11/24/15 0825  BP: 115/64 112/77 112/77 117/73  Pulse: 109 103 103 105  Temp:   98.3 F (36.8 C) 98 F (36.7 C)  TempSrc:   Oral Oral  Resp: 22 21 16 24   Height:      Weight:      SpO2: 93% 100% 95% 95%     General:  Appears calm and comfortable Eyes:  PERRL, EOMI, normal  lids, iris ENT:  grossly  lips & tongue, mmm, hard of hearing Neck:  no LAD, masses or thyromegaly Cardiovascular:  RRR, no m/r/g. No LE edema.  Respiratory:  Increased effort, on nasal cannula, few crackles throughout with decreased breath sounds in the bases. Abdomen:  soft, ntnd, NABS Skin:  no rash or induration seen on limited exam Musculoskeletal:  grossly normal tone BUE/BLE, good ROM, no bony abnormality Psychiatric:  grossly normal mood and affect, speech fluent and appropriate, AOx3 Neurologic:  CN 2-12 grossly intact, moves all extremities in coordinated fashion, sensation intact  Labs on Admission:  I have personally reviewed following labs and imaging studies  CBC:  Recent Labs Lab 11/24/15 0657  WBC 11.4*  NEUTROABS 9.0*  HGB 11.2*  HCT 37.2  MCV 96.4  PLT 123456   Basic Metabolic Panel:  Recent Labs Lab 11/24/15 0215  NA 138  K 4.0  CL 101  CO2 26  GLUCOSE 219*  BUN 13  CREATININE 1.03*  CALCIUM 10.2   GFR: Estimated Creatinine Clearance: 53.6 mL/min (by C-G formula based on Cr of 1.03). Liver Function Tests: No results for input(s): AST, ALT, ALKPHOS, BILITOT, PROT, ALBUMIN in the last 168 hours. No results for input(s): LIPASE, AMYLASE in the last 168 hours. No results for input(s): AMMONIA in the last 168 hours. Coagulation Profile: No results for input(s): INR, PROTIME in the last 168 hours. Cardiac Enzymes: No results for input(s): CKTOTAL, CKMB, CKMBINDEX, TROPONINI in the last 168 hours. BNP (last 3 results) No results for input(s): PROBNP in the last 8760 hours. HbA1C: No results for input(s): HGBA1C in the last 72 hours. CBG: No results for input(s): GLUCAP in the last 168 hours. Lipid Profile: No results for input(s): CHOL, HDL, LDLCALC, TRIG, CHOLHDL, LDLDIRECT in the last 72 hours. Thyroid Function Tests: No results for input(s): TSH, T4TOTAL, FREET4, T3FREE, THYROIDAB in the last 72 hours. Anemia Panel: No results for input(s):  VITAMINB12, FOLATE, FERRITIN, TIBC, IRON, RETICCTPCT in the last 72 hours. Urine analysis:    Component Value Date/Time   COLORURINE YELLOW 10/25/2015 0519   APPEARANCEUR TURBID* 10/25/2015 0519   LABSPEC >1.046* 10/25/2015 0519   PHURINE 5.5 10/25/2015 0519   GLUCOSEU NEGATIVE 10/25/2015 0519   HGBUR SMALL* 10/25/2015 0519   BILIRUBINUR NEGATIVE 10/25/2015 [place] 10/25/2015 0519   PROTEINUR NEGATIVE 10/25/2015 0519   UROBILINOGEN 0.2 11/21/2012 1750   NITRITE POSITIVE* 10/25/2015 0519   LEUKOCYTESUR LARGE* 10/25/2015 0519    Creatinine Clearance: Estimated Creatinine Clearance: 53.6 mL/min (by C-G formula based on Cr of 1.03).  Sepsis Labs: @LABRCNTIP (procalcitonin:4,lacticidven:4) )No results found for this or any previous visit (from the past 240 hour(s)).   Radiological Exams on Admission: Dg Chest Port 1 View  11/24/2015  CLINICAL DATA:  Initial evaluation for acute chest pain and shortness of breath. EXAM: PORTABLE CHEST 1 VIEW COMPARISON:  Prior radiograph from 11/01/2015. FINDINGS: Left-sided transvenous pacemaker/ AICD in place, stable. Cardiomegaly unchanged. Mediastinal silhouette within normal limits. Lungs are hypoinflated. Moderate diffuse pulmonary edema present. Evaluation limited for pleural effusion. No definite focal infiltrates. No pneumothorax. No acute osseous abnormality. IMPRESSION: Cardiomegaly with moderate diffuse pulmonary edema, consistent with acute CHF exacerbation. Electronically Signed   By: Jeannine Boga M.D.   On: 11/24/2015 02:42    EKG: Independently reviewed. Grossly abnormal  Assessment/Plan Active Problems:   Paroxysmal atrial fibrillation (HCC)   CHF exacerbation (HCC)   Acute diastolic (congestive) heart failure (HCC)   Acute respiratory failure (HCC)   Physical deconditioning   Hyperglycemia   Peripheral neuropathy (HCC)   HLD (hyperlipidemia)   Depression   Chest pain    Acute respiratory failure:  Likely secondary to CHF exacerbation that cannot exclude DVT/PE. Patient stopped her anticoagulation at previous admission on 10/24/2015 due to fall and large hematoma resulting in significant complications. Patient has been in a rehabilitation facility since that time and has had intermittent periods of sedentary lifestyle due to physical deconditioning. CXR concerning for CHF, BNP 801, WBC 11.4. Patient given Lasix 40 mg and then Lasix 60 mg IV in ED with some diuresis.  No reported change in lower extremity edema or significant weight change. Of note patient has up 1 pound today from that previously noted in the chart on 11/18/2015 from nursing home visit. Patient currently on 4 L nasal cannula. Prior to admission patient was on room air at baseline was CPAP requirements at night. Echo from 10/26/2015 showing EF of 50% and grade 1 diastolic dysfunction. - Tele - Lasix 40 IV BID - strict I/O, Dly wts - LE dopplers, w/ f/u CTA if needed - Wean O2 as able - ABG if worsens  CP: initially w/ CP and SOB. Resolved after O2 and ASA and nitro by EMS. Currently CP free. Trop neg. H/o MI, PAF, CHF, s/p pacemaker/defibrilator placedment. Coumadin stopped after last admission due to bleed after fall.  - cycle trop - tele - EKG in am - Cards consult - ASA, nitro  Physical decondintioning: set to DC from rehab facility the day after admission. Pt had been there for 20 days after last admission.  - PT/OT - case mgt for home PT - this was apparently already set up.  Hyperglycemia: 219 on admission. No h/o DM. - A1c - CBG Q4  Chronic/Neuropathic pain:  - Continue percocet  HLD: - continue statin, colestipol  Gout - conitnue allopurinol  Depression: - continue nortriptyline   DVT prophylaxis:  Lovenox  Code Status: FULL  Family Communication: daughter  Disposition Plan: pending improvement and CP r/o  Consults called: none  Admission status: obs - tele    Brailyn Delman J MD [facility]  If 7PM-7AM, please contact night-coverage [email or web address] Password [facility]  11/24/2015, 8:35 AM

## 2015-11-24 NOTE — ED Notes (Signed)
Family at bedside. 

## 2015-11-24 NOTE — ED Notes (Signed)
EMS was called to [facility] after pt complained of SOB and Chest pain.  She was given 2 nitro by the facility which stopped the pain.  Pt was placed on cpap by EMS however O2 level did not increase above 90%  Wheezing in lower lobes 1 neb treatment given..  EMS's found that her heart rate 130 paced.

## 2015-11-24 NOTE — Progress Notes (Signed)
Carryover patient received from Dr. Rex Kras. pmh HTN, CHF, MI, sCHF s/p ICD; who woke up sob with CP. CP resolved after nitro. Upon arrival patient placed on Bipap initially but able to be switched to 3 L of nasal cannula able to maintain O2 saturation. BNP 800's. CXR cardiomegaly with moderate diffuse pulmonary edema. Patient was given 40 mg of Lasix IV in ED. Inpatient admission to a telemetry bed. CBC is pending. Note patient had recently also been told to increase her home Lasix dose.

## 2015-11-24 NOTE — Progress Notes (Signed)
Pt. home cpap was inspected by RT. Machine appears to be intact, no frayed wires or cords. Machine operating well at this time.

## 2015-11-24 NOTE — Progress Notes (Signed)
Preliminary results by tech - Venous Duplex Lower Ext. Completed. No evidence of the veins that were clearly visualized. The peroneal veins were technically difficult to visualize bilaterally due to body habitus. A small Baker's Cyst in the left popliteal fossa. Oda Cogan, BS, RDMS, RVT

## 2015-11-24 NOTE — Progress Notes (Signed)
1900 placed a call to MD regarding lasix while pt receiving IV nss . Pt"s family verbalized concern . PA called back IV reduced to KVO rate

## 2015-11-24 NOTE — ED Notes (Signed)
Pt transported to vascular.  °

## 2015-11-24 NOTE — Progress Notes (Signed)
RT has taken patient on BIPAP and placed on 4L [place]. Patient tolerating well. RT will continue to monitor as needed.

## 2015-11-24 NOTE — Evaluation (Addendum)
Physical Therapy Evaluation Patient Details Name: Bianca [name] MRN: [id number] DOB: 11-25-1932 Today's Date: 11/24/2015   History of Present Illness  82 year old female with a past medical history of HTN,PAF, chronic systolic CHF, NICM s/p BiV ICD. Patient presented to the emergency room on 11/24/2015 with complaints of sudden onset chest pain and ongoing shortness of breath. required BiPAP; CHF exacerbation. Of note, pt has been at [facility] for rehab since 5/17 fall with Rt flank hematoma. She was scheduled to discharge to [facility] (new for her) 6/8.    Clinical Impression  Pt admitted with above diagnosis. At rest on room air, SaO2 89%. Patient desaturating on room air with mobility today (85% after walking 10 ft). Discharge plan is complicated as she was scheduled to discharge from rehab/SNF facility today and move into Independent Living apartment (with call light and life alert necklace). From a mobility perspective, she knows how to use a RW--however she was only planning to use oxygen at night. If she continues to need O2 with activity, this adds another element to increase her fall risk (manipulating 25-50ft O2 tubing). As she medically improves, hopeful she will not need oxygen during activity. Will continue to follow and educate on safe mobility with O2 tubing, as appropriate.  Pt currently with functional limitations due to the deficits listed below (see PT Problem List).  Pt will benefit from skilled PT to increase their independence and safety with mobility to allow discharge to the venue listed below.       Follow Up Recommendations TBA--she will not be safe to discharge to Independent Living if she requires full-time oxygen use and would recommend return to rehab facility (if this is an option)    Equipment Recommendations  Rolling walker with 5" wheels    Recommendations for Other Services OT consult     Precautions / Restrictions Precautions Precautions: Fall       Mobility  Bed Mobility               General bed mobility comments: sitting EOB on arrival and did not want to lie down due to dyspnea. family present  Transfers Overall transfer level: Modified independent Equipment used: Rolling walker (2 wheeled) Transfers: Sit to/from Stand Sit to Stand: Modified independent (Device/Increase time)         General transfer comment: x2 (bed and chair); excellent sequencing and steady; incr time/effort  Ambulation/Gait Ambulation/Gait assistance: Min guard Ambulation Distance (Feet): 10 Feet (seated rest; 10) Assistive device: Rolling walker (2 wheeled) Gait Pattern/deviations: Step-through pattern;Decreased stride length;Wide base of support Gait velocity: decreased due to respiratory status   General Gait Details: occasionally lifts RW; did well to navigate through narrow spaces  Stairs            Wheelchair Mobility    Modified Rankin (Stroke Patients Only)       Balance Overall balance assessment: No apparent balance deficits (not formally assessed) (too fatigued for formal testing)   Sitting balance-Leahy Scale: Fair                                       Pertinent Vitals/Pain   Pain Assessment: No/denies pain    Home Living Family/patient expects to be discharged to:: Unsure (likely independent living)                 Additional Comments: Has been in SNF since mid-May  due to fall. Was scheduled to go to Independent living on 6/8    Prior Function Level of Independence: Independent with assistive device(s)         Comments: using RW since recent fall     Hand Dominance   Dominant Hand: Right    Extremity/Trunk Assessment   Upper Extremity Assessment: Generalized weakness           Lower Extremity Assessment: Generalized weakness;RLE deficits/detail;LLE deficits/detail      Cervical / Trunk Assessment: Other exceptions  Communication   Communication:  HOH;Expressive difficulties (word-finding; slow, labored)  Cognition Arousal/Alertness: Awake/alert Behavior During Therapy: WFL for tasks assessed/performed Overall Cognitive Status: Impaired/Different from baseline Area of Impairment: Memory     Memory: Decreased short-term memory         General Comments: at times unclear if decr memory or could not find the right words    General Comments      Exercises        Assessment/Plan    PT Assessment Patient needs continued PT services  PT Diagnosis Difficulty walking   PT Problem List Decreased activity tolerance;Decreased mobility;Decreased cognition;Obesity;Decreased strength;Cardiopulmonary status limiting activity;Impaired sensation  PT Treatment Interventions DME instruction;Gait training;Functional mobility training;Therapeutic activities;Therapeutic exercise;Balance training;Patient/family education;Cognitive remediation   PT Goals (Current goals can be found in the Care Plan section) Acute Rehab PT Goals Patient Stated Goal: to get better and move in to her new community PT Goal Formulation: With patient Time For Goal Achievement: 12/01/15 Potential to Achieve Goals: Good    Frequency Min 3X/week   Barriers to discharge Decreased caregiver support      Co-evaluation               End of Session Equipment Utilized During Treatment: Gait belt;Oxygen Activity Tolerance: Patient limited by fatigue Patient left: with call bell/phone within reach;with family/visitor present (sitting EOB preparing to eat dinner) Nurse Communication: Mobility status (nurse tech)    Functional Assessment Tool Used: clinical observation Functional Limitation: Mobility: Walking and moving around Mobility: Walking and Moving Around Current Status [id number]): At least 1 percent but less than 20 percent impaired, limited or restricted Mobility: Walking and Moving Around Goal Status [phone]): At least 1 percent but less than 20 percent  impaired, limited or restricted    Time: [id number] PT Time Calculation (min) (ACUTE ONLY): 31 min   Charges:   PT Evaluation $PT Eval Low Complexity: 1 Procedure PT Treatments $Gait Training: 8-22 mins   PT G Codes:   PT G-Codes **NOT FOR INPATIENT CLASS** Functional Assessment Tool Used: clinical observation Functional Limitation: Mobility: Walking and moving around Mobility: Walking and Moving Around Current Status [id number]): At least 1 percent but less than 20 percent impaired, limited or restricted Mobility: Walking and Moving Around Goal Status [phone]): At least 1 percent but less than 20 percent impaired, limited or restricted    Bianca [name] 11/24/2015, 5:32 PM Pager [phone]

## 2015-11-25 ENCOUNTER — Telehealth: Payer: Self-pay | Admitting: Internal Medicine

## 2015-11-25 ENCOUNTER — Other Ambulatory Visit: Payer: Self-pay

## 2015-11-25 ENCOUNTER — Encounter (HOSPITAL_COMMUNITY): Payer: Self-pay | Admitting: General Practice

## 2015-11-25 DIAGNOSIS — T501X5A Adverse effect of loop [high-ceiling] diuretics, initial encounter: Secondary | ICD-10-CM | POA: Diagnosis not present

## 2015-11-25 DIAGNOSIS — R0600 Dyspnea, unspecified: Secondary | ICD-10-CM | POA: Diagnosis not present

## 2015-11-25 DIAGNOSIS — Z8249 Family history of ischemic heart disease and other diseases of the circulatory system: Secondary | ICD-10-CM | POA: Diagnosis not present

## 2015-11-25 DIAGNOSIS — G629 Polyneuropathy, unspecified: Secondary | ICD-10-CM | POA: Diagnosis not present

## 2015-11-25 DIAGNOSIS — E785 Hyperlipidemia, unspecified: Secondary | ICD-10-CM | POA: Diagnosis not present

## 2015-11-25 DIAGNOSIS — F329 Major depressive disorder, single episode, unspecified: Secondary | ICD-10-CM | POA: Diagnosis not present

## 2015-11-25 DIAGNOSIS — I5031 Acute diastolic (congestive) heart failure: Secondary | ICD-10-CM | POA: Diagnosis not present

## 2015-11-25 DIAGNOSIS — I252 Old myocardial infarction: Secondary | ICD-10-CM | POA: Diagnosis not present

## 2015-11-25 DIAGNOSIS — R739 Hyperglycemia, unspecified: Secondary | ICD-10-CM | POA: Diagnosis not present

## 2015-11-25 DIAGNOSIS — G4730 Sleep apnea, unspecified: Secondary | ICD-10-CM | POA: Diagnosis not present

## 2015-11-25 DIAGNOSIS — N3000 Acute cystitis without hematuria: Secondary | ICD-10-CM | POA: Diagnosis not present

## 2015-11-25 DIAGNOSIS — Z87891 Personal history of nicotine dependence: Secondary | ICD-10-CM | POA: Diagnosis not present

## 2015-11-25 DIAGNOSIS — I509 Heart failure, unspecified: Secondary | ICD-10-CM | POA: Diagnosis not present

## 2015-11-25 DIAGNOSIS — R2681 Unsteadiness on feet: Secondary | ICD-10-CM | POA: Diagnosis not present

## 2015-11-25 DIAGNOSIS — M1990 Unspecified osteoarthritis, unspecified site: Secondary | ICD-10-CM | POA: Diagnosis not present

## 2015-11-25 DIAGNOSIS — I110 Hypertensive heart disease with heart failure: Secondary | ICD-10-CM | POA: Diagnosis not present

## 2015-11-25 DIAGNOSIS — I5043 Acute on chronic combined systolic (congestive) and diastolic (congestive) heart failure: Secondary | ICD-10-CM | POA: Diagnosis not present

## 2015-11-25 DIAGNOSIS — R5381 Other malaise: Secondary | ICD-10-CM | POA: Diagnosis not present

## 2015-11-25 DIAGNOSIS — Z6839 Body mass index (BMI) 39.0-39.9, adult: Secondary | ICD-10-CM | POA: Diagnosis not present

## 2015-11-25 DIAGNOSIS — R0602 Shortness of breath: Secondary | ICD-10-CM | POA: Diagnosis not present

## 2015-11-25 DIAGNOSIS — I5033 Acute on chronic diastolic (congestive) heart failure: Secondary | ICD-10-CM | POA: Diagnosis not present

## 2015-11-25 DIAGNOSIS — Z95810 Presence of automatic (implantable) cardiac defibrillator: Secondary | ICD-10-CM | POA: Diagnosis not present

## 2015-11-25 DIAGNOSIS — R0789 Other chest pain: Secondary | ICD-10-CM | POA: Diagnosis not present

## 2015-11-25 DIAGNOSIS — R079 Chest pain, unspecified: Secondary | ICD-10-CM | POA: Diagnosis not present

## 2015-11-25 DIAGNOSIS — I480 Paroxysmal atrial fibrillation: Secondary | ICD-10-CM

## 2015-11-25 DIAGNOSIS — E876 Hypokalemia: Secondary | ICD-10-CM | POA: Diagnosis not present

## 2015-11-25 DIAGNOSIS — I428 Other cardiomyopathies: Secondary | ICD-10-CM | POA: Diagnosis not present

## 2015-11-25 DIAGNOSIS — R0902 Hypoxemia: Secondary | ICD-10-CM | POA: Diagnosis not present

## 2015-11-25 DIAGNOSIS — I10 Essential (primary) hypertension: Secondary | ICD-10-CM | POA: Diagnosis not present

## 2015-11-25 DIAGNOSIS — Z91048 Other nonmedicinal substance allergy status: Secondary | ICD-10-CM | POA: Diagnosis not present

## 2015-11-25 DIAGNOSIS — Z5189 Encounter for other specified aftercare: Secondary | ICD-10-CM | POA: Diagnosis not present

## 2015-11-25 DIAGNOSIS — R262 Difficulty in walking, not elsewhere classified: Secondary | ICD-10-CM | POA: Diagnosis not present

## 2015-11-25 DIAGNOSIS — J9601 Acute respiratory failure with hypoxia: Secondary | ICD-10-CM | POA: Diagnosis not present

## 2015-11-25 DIAGNOSIS — D649 Anemia, unspecified: Secondary | ICD-10-CM | POA: Diagnosis not present

## 2015-11-25 DIAGNOSIS — Z883 Allergy status to other anti-infective agents status: Secondary | ICD-10-CM | POA: Diagnosis not present

## 2015-11-25 DIAGNOSIS — Z888 Allergy status to other drugs, medicaments and biological substances status: Secondary | ICD-10-CM | POA: Diagnosis not present

## 2015-11-25 DIAGNOSIS — M6281 Muscle weakness (generalized): Secondary | ICD-10-CM | POA: Diagnosis not present

## 2015-11-25 LAB — URINE CULTURE

## 2015-11-25 LAB — CBC
HEMATOCRIT: 36.5 % (ref 36.0–46.0)
Hemoglobin: 10.9 g/dL — ABNORMAL LOW (ref 12.0–15.0)
MCH: 28.9 pg (ref 26.0–34.0)
MCHC: 29.9 g/dL — ABNORMAL LOW (ref 30.0–36.0)
MCV: 96.8 fL (ref 78.0–100.0)
PLATELETS: 205 10*3/uL (ref 150–400)
RBC: 3.77 MIL/uL — AB (ref 3.87–5.11)
RDW: 18.8 % — ABNORMAL HIGH (ref 11.5–15.5)
WBC: 7.1 10*3/uL (ref 4.0–10.5)

## 2015-11-25 LAB — URINALYSIS, ROUTINE W REFLEX MICROSCOPIC
BILIRUBIN URINE: NEGATIVE
Glucose, UA: NEGATIVE mg/dL
Ketones, ur: NEGATIVE mg/dL
NITRITE: NEGATIVE
PH: 5.5 (ref 5.0–8.0)
Protein, ur: NEGATIVE mg/dL
SPECIFIC GRAVITY, URINE: 1.014 (ref 1.005–1.030)

## 2015-11-25 LAB — GLUCOSE, CAPILLARY
GLUCOSE-CAPILLARY: 107 mg/dL — AB (ref 65–99)
GLUCOSE-CAPILLARY: 163 mg/dL — AB (ref 65–99)
Glucose-Capillary: 139 mg/dL — ABNORMAL HIGH (ref 65–99)
Glucose-Capillary: 124 mg/dL — ABNORMAL HIGH (ref 65–99)
Glucose-Capillary: 125 mg/dL — ABNORMAL HIGH (ref 65–99)
Glucose-Capillary: 131 mg/dL — ABNORMAL HIGH (ref 65–99)

## 2015-11-25 LAB — URINE MICROSCOPIC-ADD ON

## 2015-11-25 LAB — COMPREHENSIVE METABOLIC PANEL
ALK PHOS: 53 U/L (ref 38–126)
ALT: 16 U/L (ref 14–54)
AST: 28 U/L (ref 15–41)
Albumin: 3.7 g/dL (ref 3.5–5.0)
Anion gap: 10 (ref 5–15)
BILIRUBIN TOTAL: 1 mg/dL (ref 0.3–1.2)
BUN: 14 mg/dL (ref 6–20)
CALCIUM: 8.9 mg/dL (ref 8.9–10.3)
CO2: 31 mmol/L (ref 22–32)
CREATININE: 0.95 mg/dL (ref 0.44–1.00)
Chloride: 99 mmol/L — ABNORMAL LOW (ref 101–111)
GFR, EST NON AFRICAN AMERICAN: 55 mL/min — AB (ref >60)
Glucose, Bld: 111 mg/dL — ABNORMAL HIGH (ref 65–99)
Potassium: 2.9 mmol/L — ABNORMAL LOW (ref 3.5–5.1)
Sodium: 140 mmol/L (ref 135–145)
TOTAL PROTEIN: 6.5 g/dL (ref 6.5–8.1)

## 2015-11-25 MED ORDER — POTASSIUM CHLORIDE CRYS ER 20 MEQ PO TBCR
40.0000 meq | EXTENDED_RELEASE_TABLET | Freq: Once | ORAL | Status: AC
  Administered 2015-11-25: 40 meq via ORAL
  Filled 2015-11-25: qty 2

## 2015-11-25 MED ORDER — GUAIFENESIN-DM 100-10 MG/5ML PO SYRP
5.0000 mL | ORAL_SOLUTION | ORAL | Status: DC | PRN
  Administered 2015-11-26: 5 mL via ORAL
  Filled 2015-11-25: qty 5

## 2015-11-25 MED ORDER — ACETAMINOPHEN 325 MG PO TABS
650.0000 mg | ORAL_TABLET | Freq: Four times a day (QID) | ORAL | Status: DC | PRN
  Administered 2015-11-25 – 2015-11-28 (×4): 650 mg via ORAL
  Filled 2015-11-25 (×4): qty 2

## 2015-11-25 MED ORDER — GUAIFENESIN ER 600 MG PO TB12
600.0000 mg | ORAL_TABLET | Freq: Two times a day (BID) | ORAL | Status: DC
  Administered 2015-11-25 – 2015-11-29 (×9): 600 mg via ORAL
  Filled 2015-11-25 (×9): qty 1

## 2015-11-25 NOTE — Evaluation (Signed)
Occupational Therapy Evaluation Patient Details Name: Bianca [name] MRN: [id number] DOB: 09-26-1932 Today's Date: 11/25/2015    History of Present Illness 82 year old female with a past medical history of HTN,PAF, chronic systolic CHF, NICM s/p BiV ICD. Patient presented to the emergency room on 11/24/2015 with complaints of sudden onset chest pain and ongoing shortness of breath. required BiPAP; CHF exacerbation. Of note, pt has been at [facility] for rehab since 5/17 fall with Rt flank hematoma. She was scheduled to discharge to [place] (new for her) 6/8.   Clinical Impression   Pt admitted with above. She demonstrates the below listed deficits and will benefit from continued OT to maximize safety and independence with BADLs.  Pt presents to OT with generalized weakness, impaired balance.  She currently, requires min guard assist with ADLs, but does fatigue after bathing, and unable to do further activity.  Don't feel she is yet at the level to discharge to independent living as I don't feel she can mange herself over a 24 hour period and is high risk of falls.  Pt and daughter agree.  Recommend SNF level rehab then transition to [place] living.   o2 sats dropped to 84% on RA when pt practicing bed mobility       Follow Up Recommendations  SNF;Supervision/Assistance - 24 hour    Equipment Recommendations  None recommended by OT    Recommendations for Other Services       Precautions / Restrictions Precautions Precautions: Fall Precaution Comments: watch O2 saturations      Mobility Bed Mobility Overal bed mobility: Needs Assistance Bed Mobility: Supine to Sit;Sit to Supine Rolling: Min guard   Supine to sit: Min guard Sit to supine: Min guard   General bed mobility comments: min guard for safety   Transfers Overall transfer level: Needs assistance Equipment used: Rolling walker (2 wheeled) Transfers: Sit to/from [facility] Sit to Stand: Min  guard Stand pivot transfers: Min guard       General transfer comment: Pt often has to rock forward to gain momentum to stand, and often requires more than one attempt to achieve standing     Balance Overall balance assessment: Needs assistance Sitting-balance support: Feet supported Sitting balance-Leahy Scale: Good     Standing balance support: During functional activity;Single extremity supported Standing balance-Leahy Scale: Poor Standing balance comment: reliant on UE support                             ADL Overall ADL's : Needs assistance/impaired Eating/Feeding: Independent   Grooming: Wash/dry hands;Wash/dry face;Oral care;Brushing hair;Min guard;Standing   Upper Body Bathing: Set up;Supervision/ safety;Sitting   Lower Body Bathing: Min guard;Sit to/from stand   Upper Body Dressing : Set up;Supervision/safety;Sitting   Lower Body Dressing: Min guard;Sit to/from stand   Toilet Transfer: Min guard;Ambulation;Comfort height toilet;Grab bars;RW   Toileting- Water quality scientist and Hygiene: Min guard;Sit to/from stand       Functional mobility during ADLs: Min guard;Rolling walker General ADL Comments: Pt requires min A for safety and balance      Vision     Perception     Praxis      Pertinent Vitals/Pain Pain Assessment: No/denies pain     Hand Dominance Right   Extremity/Trunk Assessment Upper Extremity Assessment Upper Extremity Assessment: Generalized weakness   Lower Extremity Assessment Lower Extremity Assessment: Defer to PT evaluation   Cervical / Trunk Assessment Cervical / Trunk Assessment:  Other exceptions Cervical / Trunk Exceptions: obese   Communication Communication Communication: HOH   Cognition Arousal/Alertness: Awake/alert Behavior During Therapy: WFL for tasks assessed/performed Overall Cognitive Status: Impaired/Different from baseline Area of Impairment: Memory     Memory: Decreased short-term memory          General Comments: Pt with no recollection of working with PT yesterday    General Comments       Exercises       Shoulder Instructions      Home Living Family/patient expects to be discharged to:: Skilled nursing facility Living Arrangements: Alone                               Additional Comments: Pt was at [facility] for rehab with plan to discharge to I living at [facility], but was admitted to hospital       Prior Functioning/Environment Level of Independence: Independent with assistive device(s)        Comments: using RW since recent fall    OT Diagnosis: Generalized weakness   OT Problem List: Decreased strength;Decreased activity tolerance;Impaired balance (sitting and/or standing);Decreased safety awareness;Decreased knowledge of use of DME or AE;Cardiopulmonary status limiting activity;Obesity   OT Treatment/Interventions: Self-care/ADL training;Therapeutic exercise;DME and/or AE instruction;Therapeutic activities;Patient/family education;Balance training    OT Goals(Current goals can be found in the care plan section) Acute Rehab OT Goals Patient Stated Goal: to discharge  OT Goal Formulation: With patient/family Time For Goal Achievement: 12/09/15 Potential to Achieve Goals: Good ADL Goals Pt Will Perform Grooming: with modified independence;standing Pt Will Perform Upper Body Bathing: with modified independence;sitting Pt Will Perform Lower Body Bathing: with modified independence;sit to/from stand Pt Will Perform Upper Body Dressing: with modified independence;sitting Pt Will Perform Lower Body Dressing: with modified independence;sit to/from stand Pt Will Transfer to Toilet: with modified independence;ambulating;regular height toilet;bedside commode;grab bars Pt Will Perform Toileting - Clothing Manipulation and hygiene: with modified independence;sit to/from stand  OT Frequency: Min 2X/week   Barriers to D/C: Decreased  caregiver support          Co-evaluation              End of Session Equipment Utilized During Treatment: Rolling walker;Oxygen Nurse Communication: Mobility status  Activity Tolerance: Patient tolerated treatment well Patient left: in bed;with call bell/phone within reach;with bed alarm set;with family/visitor present   Time: [id number] OT Time Calculation (min): 63 min Charges:  OT General Charges $OT Visit: 1 Procedure OT Evaluation $OT Eval Moderate Complexity: 1 Procedure OT Treatments $Self Care/Home Management : 38-52 mins G-Codes: OT G-codes **NOT FOR INPATIENT CLASS** Functional Limitation: Self care Self Care Current Status [id number]): At least 20 percent but less than 40 percent impaired, limited or restricted Self Care Goal Status [id number]): At least 1 percent but less than 20 percent impaired, limited or restricted  Janyra Barillas M 11/25/2015, 5:48 PM

## 2015-11-25 NOTE — Progress Notes (Signed)
Patient ID: Bianca [name], female   DOB: [birth year], 82 y.o.   MRN: [id number]    PROGRESS NOTE    Bianca [name]  [id number] DOB: 01-22-1933 DOA: 11/24/2015  PCP: Jonathon Bellows, MD   Brief Narrative:  82 y.o. female with medical history of MI, CHF, HTN, anemia status post pacemaker/defibrilator placement, presenting after acute onset SOB and CP. Pt has been resident at [facility] place for the last 20 days. PT explained that episode occurred last night when she got up to go to the restroom.   In ED, she required BiPAP, chest x-ray shows cardiomegaly with moderate diffuse pulmonary edema consistent with CHF exacerbation. Her weight on admission is 244 pounds. She was given 60 mg of IV Lasix in the emergency room. She has diuresed 1.2 L.  Her last echo in May 2017, EF 50-55%.   Assessment & Plan:   Acute on chronic diastolic CHF - reports feeling better this am - appreciate  cardiology team following - continue with lasix IV for now and change to PO once clinically indicated  - monitor daily weights, strict I/O - she had left heart cath May 2012 that showed no CAD. EF 25-30%. EF has since improved to 50-55% per Echo last month.   PAF - coumadin has been on hold due to recent bleeding post fall episode   - keep on tele  - cardiology added metoprolol  - Eliquis to be considered  Hypokalemia  - from Lasix - supplement and repeat BMP in AM  HLD - On high intensity statin, continue same  Foul smelling urine - no other urinary concerns - UA and urine culture requested - pt with no fever and normal WBC   Essential HTN - reasonable inpatient control   Morbid obesity due to excess calories  - Body mass index is 39.4 kg/(m^2)  DVT prophylaxis:  Code Status: Full  Family Communication: Patient and daughters at bedside  Disposition Plan: Home when cardio team clears   Consultants:   Cardiology   Procedures:   None  Antimicrobials:   None   Subjective: Reports  feeling better.   Objective: Filed Vitals:   11/24/15 2111 11/25/15 0048 11/25/15 0500 11/25/15 0514  BP: 129/91 117/78 128/83   Pulse: 105 96 96   Temp: 98.8 F (37.1 C) 99.6 F (37.6 C) 99.5 F (37.5 C)   TempSrc: Oral Oral Oral   Resp: 20 18 20    Height:      Weight:    110.678 kg (244 lb)  SpO2: 94% 94% 98%     Intake/Output Summary (Last 24 hours) at 11/25/15 0644 Last data filed at 11/25/15 0603  Gross per 24 hour  Intake   1010 ml  Output   2750 ml  Net  -1740 ml   Filed Weights   11/24/15 0223 11/24/15 1401 11/25/15 0514  Weight: 112.492 kg (248 lb) 110.904 kg (244 lb 8 oz) 110.678 kg (244 lb)    Examination:  General exam: Appears calm and comfortable  Respiratory system: Respiratory effort normal.Crackles at bases and diminished breath sounds at bases.  Cardiovascular system: S1 & S2 heard, RRR. No rubs, gallops or clicks.  Gastrointestinal system: Abdomen is nondistended, soft and nontender.  Central nervous system: Alert and oriented. No focal neurological deficits. Extremities: Symmetric 5 x 5 power. Bilateral LE edema +1-2, worse in RLE, chronic venous stasis changes bilaterally  Psychiatry: Judgement and insight appear normal. Mood & affect appropriate.    Data Reviewed: I  have personally reviewed following labs and imaging studies  CBC:  Recent Labs Lab 11/24/15 0657 11/25/15 0313  WBC 11.4* 7.1  NEUTROABS 9.0*  --   HGB 11.2* 10.9*  HCT 37.2 36.5  MCV 96.4 96.8  PLT 213 [id number]   Basic Metabolic Panel:  Recent Labs Lab 11/24/15 0215 11/25/15 0313  NA 138 140  K 4.0 2.9*  CL 101 99*  CO2 26 31  GLUCOSE 219* 111*  BUN 13 14  CREATININE 1.03* 0.95  CALCIUM 10.2 8.9   Liver Function Tests:  Recent Labs Lab 11/25/15 0313  AST 28  ALT 16  ALKPHOS 53  BILITOT 1.0  PROT 6.5  ALBUMIN 3.7   Cardiac Enzymes:  Recent Labs Lab 11/24/15 0840 11/24/15 1136 11/24/15 1416  TROPONINI 0.14* 0.13* 0.14*   CBG:  Recent Labs Lab  11/24/15 1655 11/24/15 2106 11/25/15 0046  GLUCAP 111* 127* 107*   Urine analysis:    Component Value Date/Time   COLORURINE YELLOW 11/24/2015 [place] 11/24/2015 0925   LABSPEC 1.008 11/24/2015 0925   PHURINE 6.0 11/24/2015 0925   GLUCOSEU NEGATIVE 11/24/2015 0925   HGBUR NEGATIVE 11/24/2015 0925   BILIRUBINUR NEGATIVE 11/24/2015 0925   KETONESUR NEGATIVE 11/24/2015 0925   PROTEINUR NEGATIVE 11/24/2015 0925   UROBILINOGEN 0.2 11/21/2012 1750   NITRITE NEGATIVE 11/24/2015 0925   LEUKOCYTESUR SMALL* 11/24/2015 0925    Recent Results (from the past 240 hour(s))  MRSA PCR Screening     Status: None   Collection Time: 11/24/15  9:35 PM  Result Value Ref Range Status   MRSA by PCR NEGATIVE NEGATIVE Final    Comment:        The GeneXpert MRSA Assay (FDA approved for NASAL specimens only), is one component of a comprehensive MRSA colonization surveillance program. It is not intended to diagnose MRSA infection nor to guide or monitor treatment for MRSA infections.       Radiology Studies: Dg Chest Port 1 View  11/24/2015  CLINICAL DATA:  Initial evaluation for acute chest pain and shortness of breath. EXAM: PORTABLE CHEST 1 VIEW COMPARISON:  Prior radiograph from 11/01/2015. FINDINGS: Left-sided transvenous pacemaker/ AICD in place, stable. Cardiomegaly unchanged. Mediastinal silhouette within normal limits. Lungs are hypoinflated. Moderate diffuse pulmonary edema present. Evaluation limited for pleural effusion. No definite focal infiltrates. No pneumothorax. No acute osseous abnormality. IMPRESSION: Cardiomegaly with moderate diffuse pulmonary edema, consistent with acute CHF exacerbation. Electronically Signed   By: Jeannine Boga M.D.   On: 11/24/2015 02:42      Scheduled Meds: . allopurinol  400 mg Oral Daily  . colestipol  2 g Oral Daily  . enoxaparin (LOVENOX) injection  40 mg Subcutaneous Q24H  . furosemide  60 mg Intravenous BID  .  gabapentin  800 mg Oral TID  . nortriptyline  25 mg Oral QHS  . potassium chloride  10 mEq Oral BID  . potassium chloride  40 mEq Oral Once  . pravastatin  80 mg Oral QPM  . sodium chloride flush  3 mL Intravenous Q12H   Continuous Infusions: . sodium chloride 50 mL/hr at 11/24/15 1849        Time spent: 20 minutes    Faye Ramsay, MD [facility] Pager [phone]  If 7PM-7AM, please contact night-coverage [email or web address] Password [facility] 11/25/2015, 6:44 AM

## 2015-11-25 NOTE — Care Management Obs Status (Signed)
[facility] NOTIFICATION   Patient Details  Name: Bianca [name] MRN: [id number] Date of Birth: [birth year]   Medicare Observation Status Notification Given:  Yes    Royston Bake, RN 11/25/2015, 4:05 PM

## 2015-11-25 NOTE — Progress Notes (Signed)
Pt had 7 beat run of nonsustained V-tach no S/S, notified MD, will continue to monitor, Thanks Arvella Nigh RN

## 2015-11-25 NOTE — Progress Notes (Addendum)
Patient Name: Bianca [name] Date of Encounter: 11/25/2015  Hospital Problem List     Active Problems:   Paroxysmal atrial fibrillation [facility])   CHF exacerbation (HCC)   Acute diastolic (congestive) heart failure (HCC)   Acute respiratory failure (HCC)   Physical deconditioning   Hyperglycemia   Peripheral neuropathy (HCC)   HLD (hyperlipidemia)   Depression   Chest pain    Subjective   Breathing somewhat easier this morning. Did have trouble using her CPAP last night  Inpatient Medications    . allopurinol  400 mg Oral Daily  . colestipol  2 g Oral Daily  . enoxaparin (LOVENOX) injection  40 mg Subcutaneous Q24H  . furosemide  60 mg Intravenous BID  . gabapentin  800 mg Oral TID  . guaiFENesin  600 mg Oral BID  . nortriptyline  25 mg Oral QHS  . potassium chloride  10 mEq Oral BID  . pravastatin  80 mg Oral QPM  . sodium chloride flush  3 mL Intravenous Q12H    Vital Signs    Filed Vitals:   11/25/15 0048 11/25/15 0500 11/25/15 0514 11/25/15 0748  BP: 117/78 128/83  133/91  Pulse: 96 96  96  Temp: 99.6 F (37.6 C) 99.5 F (37.5 C)  98.7 F (37.1 C)  TempSrc: Oral Oral  Oral  Resp: 18 20  18   Height:      Weight:   244 lb (110.678 kg)   SpO2: 94% 98%  99%    Intake/Output Summary (Last 24 hours) at 11/25/15 1018 Last data filed at 11/25/15 0936  Gross per 24 hour  Intake   1250 ml  Output   2650 ml  Net  -1400 ml   Filed Weights   11/24/15 0223 11/24/15 1401 11/25/15 0514  Weight: 248 lb (112.492 kg) 244 lb 8 oz (110.904 kg) 244 lb (110.678 kg)    Physical Exam    General: Pleasant obese female, NAD. Neuro: Alert and oriented X 3. Moves all extremities spontaneously. Psych: Normal affect. HEENT:  Normal  Neck: Supple without bruits or JVD. Lungs:  Resp regular and unlabored, Mild crackles bilateral lower lobes. Heart: RRR no s3, s4, or murmurs. Abdomen: Soft, non-tender, non-distended, BS + x 4. Large distended, healing hematoma to right  flank.  Extremities: No clubbing, cyanosis 2+ edema. DP/PT/Radials 2+ and equal bilaterally.  Labs    CBC  Recent Labs  11/24/15 0657 11/25/15 0313  WBC 11.4* 7.1  NEUTROABS 9.0*  --   HGB 11.2* 10.9*  HCT 37.2 36.5  MCV 96.4 96.8  PLT 213 99991111   Basic Metabolic Panel  Recent Labs  11/24/15 0215 11/25/15 0313  NA 138 140  K 4.0 2.9*  CL 101 99*  CO2 26 31  GLUCOSE 219* 111*  BUN 13 14  CREATININE 1.03* 0.95  CALCIUM 10.2 8.9   Liver Function Tests  Recent Labs  11/25/15 0313  AST 28  ALT 16  ALKPHOS 53  BILITOT 1.0  PROT 6.5  ALBUMIN 3.7   Cardiac Enzymes  Recent Labs  11/24/15 0840 11/24/15 1136 11/24/15 1416  TROPONINI 0.14* 0.13* 0.14*     Telemetry    V-paced Rate-100s-120s. 7 beat run if NSVT  ECG    V-paced Rate-96  Radiology    Dg Chest Port 1 View  11/24/2015  CLINICAL DATA:  Initial evaluation for acute chest pain and shortness of breath. EXAM: PORTABLE CHEST 1 VIEW COMPARISON:  Prior radiograph from 11/01/2015. FINDINGS: Left-sided  transvenous pacemaker/ AICD in place, stable. Cardiomegaly unchanged. Mediastinal silhouette within normal limits. Lungs are hypoinflated. Moderate diffuse pulmonary edema present. Evaluation limited for pleural effusion. No definite focal infiltrates. No pneumothorax. No acute osseous abnormality. IMPRESSION: Cardiomegaly with moderate diffuse pulmonary edema, consistent with acute CHF exacerbation. Electronically Signed   By: Jeannine Boga M.D.   On: 11/24/2015 02:42   Assessment & Plan    [name] is a 82 year old female with a past medical history of HTN,PAF, chronic systolic CHF, NICM s/p BiV ICD.  She presented to the emergency room on 11/24/2015 with complaints of sudden onset chest pain and ongoing shortness of breath. Her BNP is elevated and chest x-ray shows pulmonary edema. She required short-term BiPAP currently on supplemental O2 via nasal cannula. She is received one dose of IV Lasix  in the ED and diuresed 1.2 L.  1. Acute on chronic diastolic CHF:  --Currently on 60mg  IV lasix BID. Cr trend improved from 1.03>>0.95. UOP 2.7L yesterday. Weight 248>>244Lbs since yesterday --Troponin is mildly elevated at 0.13>>0.13>>0.14 in the setting of CHF exacerbation. --She had a left heart cath in May of 2012 that showed no CAD. EF at that time was 25-30%. EF has since improved to 50-55% per Echo last month. --No need for repeat echo as she had one less than a month ago. --Continue with IV lasix, daily weights and I&Os  2. PAF: Patient was on Coumadin before she had her fall that resulted in severe right flank hematoma in May of 2017. She was hospitalized and required FFP to reverse her INR and PRBC's for blood loss anemia. Continue to hold Coumadin. Stated yesterday she would like to consider Eliquis in the future. Renal function stable.  3. HLD: On high intensity statin, continue same.  4. HTN: Given her diastolic dysfunction, and elevated HR would add metoprolol 12.5mg  BID.  Signed, Reino Bellis NP-C Pager [phone]   Personally seen and examined. Agree with above.  82 year old with acute on chronic diastolic HF  - Agree with continuing Lasix IV.  - Holding anticoagulation given her recent fall that resulted in blood transfusion, severe right flank hematoma in May 2017. She would like to consider Eliquis in the future if necessary.  - Adding low-dose metoprolol. Mildly tachycardic. Ventricular paced.  - Still with mild crackles on exam.  - May be ready tomorrow for discharge but potentially Sunday.  Candee Furbish, MD

## 2015-11-25 NOTE — Telephone Encounter (Signed)
Spoke w/ daughter in law and she informed me that pt was suppose to go home from rehab yesterday but she was transported to [facility] and was admitted. She is unsure what the plan for the patient is at this point in time. Informed her that she can the monitor to the hospital or wait for pt to get home before she send the transmission. She said she would try to find it b/c patient is currently moving and she isn't sure where the monitor is at this point but if she finds it before patient goes home she will send the transmission from the hospital or nursing home.

## 2015-11-25 NOTE — Care Management Note (Signed)
Case Management Note  Patient Details  Name: Bianca [name] MRN: [id number] Date of Birth: [birth year]  Subjective/Objective:      Admitted with CHF              Action/Plan: Patient recently discharged from [facility], she was there for 20 days and at discharge she was arranged to go to an [place]; PT/OT eval in progress for a safe DCP; patient is agreeable to go back to snf if needed; Soc Worker made aware. CM will continue to follow for DCP.  Expected Discharge Date:    possibly   12/02/2015            Expected Discharge Plan:  Skilled Nursing Facility  In-House Referral:  Clinical Social Work Choice offered to:  Patient, Adult Children  Status of Service:  In process, will continue to follow  [name] [phone] 11/25/2015, 4:07 PM

## 2015-11-25 NOTE — Telephone Encounter (Signed)
New MEssage  Pt dtr in law calling- pt noshow remote chk on 5/31-pt has been in hospital or rehab since 5/8. She wanted to inform our office- pt currently @ [facility]. Please advise.

## 2015-11-25 NOTE — Progress Notes (Signed)
Physical Therapy Treatment Patient Details Name: Bianca [name] MRN: [id number] DOB: Apr 01, 1933 Today's Date: 11/25/2015    History of Present Illness 82 year old female with a past medical history of HTN,PAF, chronic systolic CHF, NICM s/p BiV ICD. Patient presented to the emergency room on 11/24/2015 with complaints of sudden onset chest pain and ongoing shortness of breath. required BiPAP; CHF exacerbation. Of note, pt has been at [facility] for rehab since 5/17 fall with Rt flank hematoma. She was scheduled to discharge to [place] (new for her) 6/8.    PT Comments    Patient demonstrating poor safety awareness and poor ability to problem-solve how to ambulate with [employer] O2 tubing and RW. Even with max cues, she was unable to understand PT's instructions and ultimately PT had to handle the O2 tubing to prevent her tangling/tripping on it. She required 3L [place] O2 to maintain SaO2 90% with ambulation.    Follow Up Recommendations  SNF;Supervision/Assistance - 24 hour     Equipment Recommendations  Rolling walker with 5" wheels    Recommendations for Other Services       Precautions / Restrictions Precautions Precautions: Fall Precaution Comments: watch O2 saturations    Mobility  Bed Mobility Overal bed mobility: Needs Assistance Bed Mobility: Supine to Sit;Sit to Supine Rolling: Min guard   Supine to sit: Min guard Sit to supine: Min guard   General bed mobility comments: min guard for safety   Transfers Overall transfer level: Needs assistance Equipment used: Rolling walker (2 wheeled) Transfers: Sit to/from Stand Sit to Stand: Supervision Stand pivot transfers: Min guard       General transfer comment: x 3; supervision due to decr cognition/judgement  Ambulation/Gait Ambulation/Gait assistance: Min guard Ambulation Distance (Feet): 20 Feet (seated rest; 70) Assistive device: Rolling walker (2 wheeled) Gait Pattern/deviations: Step-through pattern;Decreased  stride length;Shuffle;Trunk flexed;Wide base of support Gait velocity: decreased due to respiratory status   General Gait Details: unable to figure out how to manage O2 tubing and hold onto RW without getting tubing entangled in wheels. Max cues not always effective and required assist   Stairs            Wheelchair Mobility    Modified Rankin (Stroke Patients Only)       Balance Overall balance assessment: Needs assistance Sitting-balance support: Feet supported Sitting balance-Leahy Scale: Fair     Standing balance support: No upper extremity supported Standing balance-Leahy Scale: Fair Standing balance comment: reliant on UE support                     Cognition Arousal/Alertness: Awake/alert Behavior During Therapy: WFL for tasks assessed/performed Overall Cognitive Status: Impaired/Different from baseline Area of Impairment: Memory;Safety/judgement;Problem solving     Memory: Decreased short-term memory   Safety/Judgement: Decreased awareness of safety   Problem Solving: Difficulty sequencing;Requires verbal cues;Requires tactile cues General Comments: Patient instructed x 3 that she could not walk any further due to end of her oxygen tubing--clearly did not understand and continued to try to walk out door. She then decided she should just take off her oxygen and instructed to keep it on. She could not problem solve which way to turn to avoid tangling into her O2 tubing.    Exercises      General Comments General comments (skin integrity, edema, etc.): Pt's 02 sats remained >90% on RA througout ADL, however, when pt returned to supine, sats decreased to 84% on RA.  Pilar Plate discussion with pt re:  her current status, fall risk, and need for further rehab before transitioning to [place] living, as i don't foresee she can manage independently over a 24 hour time frame. Pt agreed, but is concerned that she has no further SNF days available with her insurance  plan.  Daughters present.  CM made aware       Pertinent Vitals/Pain Pain Assessment: No/denies pain    Home Living Family/patient expects to be discharged to:: Skilled nursing facility Living Arrangements: Alone             Additional Comments: Pt was at [facility] for rehab with plan to discharge to I living at [facility], but was admitted to hospital     Prior Function Level of Independence: Independent with assistive device(s)      Comments: using RW since recent fall   PT Goals (current goals can now be found in the care plan section) Acute Rehab PT Goals Patient Stated Goal: to get better and move in to her new community Time For Goal Achievement: 12/01/15 Progress towards PT goals: Progressing toward goals    Frequency  Min 2X/week    PT Plan Discharge plan needs to be updated    Co-evaluation             End of Session Equipment Utilized During Treatment: Gait belt;Oxygen Activity Tolerance: Patient limited by fatigue Patient left: with call bell/phone within reach;in chair;with chair alarm set     Time: ID:[id number] PT Time Calculation (min) (ACUTE ONLY): 32 min  Charges:  $Gait Training: 8-22 mins $Therapeutic Activity: 8-22 mins                    G Codes:      Riya Huxford 20-Dec-2015, 6:56 PM  Pager [phone]

## 2015-11-25 NOTE — Consult Note (Signed)
   [facility] Inpatient Consult   11/25/2015  Bianca [name] [birth year] [id number]  Patient screened for potential [place] [facility] services. Patient is eligible for [place] with her Health Team Advantage Plan.  Patient is 82 y.o. female with medical history of MI, CHF, HTN, anemia status post pacemaker/defibrilator placement, presenting after acute onset SOB and CP. She had recently returned home from [facility] for a short term rehab stay.   Electronic medical record reveals patient's discharge plan is  Not determined at this time as awaiting the evaluation of the Physical Therapist. Met with the patient and daughter at bedside for concerns of ongoing skilled nursing needs for rehab.  Explained services of [place] [facility] and they are anxiously awaiting to see if an additional rehab stay will be approved.  A brochure with contact information was given.  Encouraged them to call for questions or follow up needs. For questions please contact:   Natividad Brood, RN BSN [facility] Liaison  [phone] business mobile phone Toll free office [phone]

## 2015-11-25 NOTE — Progress Notes (Signed)
Patient recently discharged from [facility] 11/24/2015 and ? Going to [facility]; Physical Therapy to eval for disposition needs; Soc Worker consult placed; Aneta Mins [phone]

## 2015-11-26 DIAGNOSIS — J9601 Acute respiratory failure with hypoxia: Secondary | ICD-10-CM

## 2015-11-26 DIAGNOSIS — I5033 Acute on chronic diastolic (congestive) heart failure: Secondary | ICD-10-CM

## 2015-11-26 LAB — BASIC METABOLIC PANEL
ANION GAP: 11 (ref 5–15)
BUN: 18 mg/dL (ref 6–20)
CO2: 31 mmol/L (ref 22–32)
Calcium: 9.3 mg/dL (ref 8.9–10.3)
Chloride: 97 mmol/L — ABNORMAL LOW (ref 101–111)
Creatinine, Ser: 0.93 mg/dL (ref 0.44–1.00)
GFR calc Af Amer: >60 mL/min (ref >60)
GFR, EST NON AFRICAN AMERICAN: 56 mL/min — AB (ref >60)
Glucose, Bld: 122 mg/dL — ABNORMAL HIGH (ref 65–99)
POTASSIUM: 3.1 mmol/L — AB (ref 3.5–5.1)
SODIUM: 139 mmol/L (ref 135–145)

## 2015-11-26 LAB — GLUCOSE, CAPILLARY
Glucose-Capillary: 117 mg/dL — ABNORMAL HIGH (ref 65–99)
Glucose-Capillary: 131 mg/dL — ABNORMAL HIGH (ref 65–99)

## 2015-11-26 LAB — CBC
HEMATOCRIT: 37.9 % (ref 36.0–46.0)
HEMOGLOBIN: 11.3 g/dL — AB (ref 12.0–15.0)
MCH: 28.9 pg (ref 26.0–34.0)
MCHC: 29.8 g/dL — AB (ref 30.0–36.0)
MCV: 96.9 fL (ref 78.0–100.0)
Platelets: 237 10*3/uL (ref 150–400)
RBC: 3.91 MIL/uL (ref 3.87–5.11)
RDW: 18.2 % — ABNORMAL HIGH (ref 11.5–15.5)
WBC: 8.1 10*3/uL (ref 4.0–10.5)

## 2015-11-26 LAB — URINE CULTURE: CULTURE: NO GROWTH

## 2015-11-26 MED ORDER — DEXTROSE 5 % IV SOLN
1.0000 g | INTRAVENOUS | Status: DC
  Administered 2015-11-26 – 2015-11-28 (×3): 1 g via INTRAVENOUS
  Filled 2015-11-26 (×4): qty 10

## 2015-11-26 NOTE — Progress Notes (Signed)
Patient ID: [name], female   DOB: May 23, 1933, 82 y.o.   MRN: [id number]   Patient Name: Bianca [name] Date of Encounter: 11/26/2015  Hospital Problem List     Active Problems:   Paroxysmal atrial fibrillation [facility])   CHF exacerbation (HCC)   Acute diastolic (congestive) heart failure (HCC)   Acute respiratory failure (HCC)   Physical deconditioning   Hyperglycemia   Peripheral neuropathy (HCC)   HLD (hyperlipidemia)   Depression   Chest pain   Acute on chronic congestive heart failure ([place])    Subjective   Lethargic sitting in chair.  Low grade fever   Inpatient Medications    . allopurinol  400 mg Oral Daily  . colestipol  2 g Oral Daily  . enoxaparin (LOVENOX) injection  40 mg Subcutaneous Q24H  . furosemide  60 mg Intravenous BID  . gabapentin  800 mg Oral TID  . guaiFENesin  600 mg Oral BID  . nortriptyline  25 mg Oral QHS  . potassium chloride  10 mEq Oral BID  . pravastatin  80 mg Oral QPM  . sodium chloride flush  3 mL Intravenous Q12H    Vital Signs    Filed Vitals:   11/25/15 1637 11/25/15 2104 11/26/15 0513 11/26/15 1140  BP: 134/85 118/80 123/80 119/68  Pulse: 102 106 106 100  Temp: 97.8 F (36.6 C) 98.9 F (37.2 C) 97.9 F (36.6 C) 99.9 F (37.7 C)  TempSrc: Oral Oral Oral Oral  Resp: 18 18 18 18   Height:      Weight:   108.047 kg (238 lb 3.2 oz)   SpO2: 96% 96% 97% 98%    Intake/Output Summary (Last 24 hours) at 11/26/15 1237 Last data filed at 11/26/15 1149  Gross per 24 hour  Intake    240 ml  Output   2340 ml  Net  -2100 ml   Filed Weights   11/24/15 1401 11/25/15 0514 11/26/15 0513  Weight: 110.904 kg (244 lb 8 oz) 110.678 kg (244 lb) 108.047 kg (238 lb 3.2 oz)    Physical Exam    General: Pleasant obese female, NAD. Neuro: Alert and oriented X 3. Moves all extremities spontaneously. Psych: Normal affect. HEENT:  Normal  Neck: Supple without bruits or JVD. Lungs:  Resp regular and unlabored, Mild crackles  bilateral lower lobes. Heart: RRR no s3, s4, or murmurs. Abdomen: Soft, non-tender, non-distended, BS + x 4. Large distended, healing hematoma to right flank.  Extremities: No clubbing, cyanosis 2+ edema. With stasis changes  DP/PT/Radials 2+ and equal bilaterally.  Labs    CBC  Recent Labs  11/24/15 0657 11/25/15 0313 11/26/15 0309  WBC 11.4* 7.1 8.1  NEUTROABS 9.0*  --   --   HGB 11.2* 10.9* 11.3*  HCT 37.2 36.5 37.9  MCV 96.4 96.8 96.9  PLT 213 205 [id number]   Basic Metabolic Panel  Recent Labs  11/25/15 0313 11/26/15 0309  NA 140 139  K 2.9* 3.1*  CL 99* 97*  CO2 31 31  GLUCOSE 111* 122*  BUN 14 18  CREATININE 0.95 0.93  CALCIUM 8.9 9.3   Liver Function Tests  Recent Labs  11/25/15 0313  AST 28  ALT 16  ALKPHOS 53  BILITOT 1.0  PROT 6.5  ALBUMIN 3.7   Cardiac Enzymes  Recent Labs  11/24/15 0840 11/24/15 1136 11/24/15 1416  TROPONINI 0.14* 0.13* 0.14*     Telemetry    V-paced Rate-100s-120s. 7 beat run if NSVT  ECG  V-paced [id number] 11/26/2015   Radiology    Dg Chest Port 1 View  11/24/2015  CLINICAL DATA:  Initial evaluation for acute chest pain and shortness of breath. EXAM: PORTABLE CHEST 1 VIEW COMPARISON:  Prior radiograph from 11/01/2015. FINDINGS: Left-sided transvenous pacemaker/ AICD in place, stable. Cardiomegaly unchanged. Mediastinal silhouette within normal limits. Lungs are hypoinflated. Moderate diffuse pulmonary edema present. Evaluation limited for pleural effusion. No definite focal infiltrates. No pneumothorax. No acute osseous abnormality. IMPRESSION: Cardiomegaly with moderate diffuse pulmonary edema, consistent with acute CHF exacerbation. Electronically Signed   By: Jeannine Boga M.D.   On: 11/24/2015 02:42   Assessment & Plan    Ms. Nila is a 82 year old female with a past medical history of HTN,PAF, chronic systolic CHF, NICM s/p BiV ICD.  She presented to the emergency room on 11/24/2015 with complaints of  sudden onset chest pain and ongoing shortness of breath. Her BNP is elevated and chest x-ray shows pulmonary edema. She required short-term BiPAP currently on supplemental O2 via nasal cannula. She is received one dose of IV Lasix in the ED and diuresed 1.2 L.  1. Acute on chronic diastolic CHF:  --Currently on 60mg  IV lasix BID. Cr trend improved from 1.03>>0.95. UOP 2.7L yesterday. Weight 248>>244Lbs since yesterday --Troponin is mildly elevated at 0.13>>0.13>>0.14 in the setting of CHF exacerbation. --She had a left heart cath in May of 2012 that showed no CAD. EF at that time was 25-30%. EF has since improved to 50-55% per Echo last month. --No need for repeat echo as she had one less than a month ago. --Continue with IV lasix, daily weights and I&Os  2. PAF: Patient was on Coumadin before she had her fall that resulted in severe right flank hematoma in May of 2017. She was hospitalized and required FFP to reverse her INR and PRBC's for blood loss anemia. Continue to hold Coumadin. Stated yesterday she would like to consider Eliquis in the future. Renal function stable. Would not restart at this time   3. HLD: On high intensity statin, continue same.  4. HTN: Given her diastolic dysfunction, and elevated HR Lopressor added yesterday  5. Fever:  UA was cloudy with leukocytes  CXR 6/8 CHF no pneumonia w/u per primary service

## 2015-11-26 NOTE — Clinical Social Work Note (Signed)
Clinical Social Work Assessment  Patient Details  Name: Bianca [name] MRN: [id number] Date of Birth: 28-Mar-1933  Date of referral:  11/26/15               Reason for consult:  Facility Placement                Permission sought to share information with:  Chartered certified accountant granted to share information::  Yes, Verbal Permission Granted  Name::     Bianca [name]  Agency::  SNF admissions  Relationship::     Contact Information:     Housing/Transportation Living arrangements for the past 2 months:  [place] of Information:  Patient, Adult Children Patient Interpreter Needed:  None Criminal Activity/Legal Involvement Pertinent to Current Situation/Hospitalization:  No - Comment as needed Significant Relationships:  Adult Children Lives with:  Self Do you feel safe going back to the place where you live?  No Need for family participation in patient care:     Care giving concerns: Patient and family feel she needs some short term rehab again  Social Worker assessment / plan:  Patient is an 82 year old female who was at [facility] for rehab, but then was discharged to hospital day she was going to be going home to her indepdent living facility.  Patient was sleepy during visit, most of assessment was completed by talking to her family.  Patient has been at a SNF for 20 days already, so patient will be her copay days.  CSW explained to patient's family how it will affect how insurance will pay for her stay.  Patient's family is aware of patient having a cost immediately, but gave CSW permission to fax patient's information out to different facilities.  Patient and her family are familiar with process and what to expect at SNF.  Patient's family did not express any other questions or concerns.  Employment status:  Retired Nurse, adult PT Recommendations:  [place] / Referral to  community resources:  [place]  Patient/Family's Response to care:  Patient and family in agreement to going to SNF again for short term rehab. Patient/Family's Understanding of and Emotional Response to Diagnosis, Current Treatment, and Prognosis:  Patient and family aware of current prognosis and treatment plan.  Emotional Assessment Appearance:  Appears younger than stated age Attitude/Demeanor/Rapport:    Affect (typically observed):  Calm, Stable, Pleasant Orientation:  Oriented to Self, Oriented to Place, Oriented to  Time, Oriented to Situation Alcohol / Substance use:  Not Applicable Psych involvement (Current and /or in the community):  No (Comment)  Discharge Needs  Concerns to be addressed:  Lack of Support Readmission within the last 30 days:  Yes ([place]) Current discharge risk:  Lives alone Barriers to Discharge:  Continued Medical Work up   Anell Barr 11/26/2015, 6:21 PM

## 2015-11-26 NOTE — Progress Notes (Addendum)
Patient ID: Bianca [name], female   DOB: [birth year], 82 y.o.   MRN: [id number]    PROGRESS NOTE    Bianca [name]  [id number] DOB: September 12, 1932 DOA: 11/24/2015  PCP: Jonathon Bellows, MD   Brief Narrative:  82 y.o. female with medical history of MI, CHF, HTN, anemia status post pacemaker/defibrilator placement, presenting after acute onset SOB and CP. Pt has been resident at [facility] for the last 20 days. PT explained that episode occurred last night when she got up to go to the restroom.   In ED, she required BiPAP, chest x-ray shows cardiomegaly with moderate diffuse pulmonary edema consistent with CHF exacerbation. Her weight on admission is 244 pounds. She was given 60 mg of IV Lasix in the emergency room. She has diuresed 1.2 L.  Her last echo in May 2017, EF 50-55%.   Assessment & Plan:   Acute on chronic diastolic CHF - reports feeling better this am - appreciate  cardiology team following - continue with lasix IV for now and change to PO once clinically indicated  - monitor daily weights, strict I/O - she had left heart cath May 2012 that showed no CAD. EF 25-30%. EF has since improved to 50-55% per Echo last month.  - weight down from 248 --> 238 lbs this AM   PAF - coumadin has been on hold due to recent bleeding post fall episode   - keep on tele  - cardiology added metoprolol  - Eliquis to be considered in future but will not restart now   Hypokalemia  - from Lasix - still low, continue to supplement - BMP in AM  UTI - pt with some dysuria, foul urine odor - started Rocephin IV and will need to follow up on urine cu;ltures   HLD - On high dose statin, continue same  Essential HTN - reasonable inpatient control   Morbid obesity due to excess calories  - Body mass index is 39.4 kg/(m^2)  DVT prophylaxis: Lovenox Sq Code Status: Full  Family Communication: Patient and daughters at bedside  Disposition Plan: Home when cardio team clears    Consultants:   Cardiology   Procedures:   None  Antimicrobials:   Rocephin 6/10 -->   Subjective: Reports feeling better. Still with dyspnea on exertion. Dysuria worse this am.  Objective: Filed Vitals:   11/26/15 0513 11/26/15 0815 11/26/15 0820 11/26/15 1140  BP: 123/80   119/68  Pulse: 106   100  Temp: 97.9 F (36.6 C)   99.9 F (37.7 C)  TempSrc: Oral   Oral  Resp: 18   18  Height:      Weight: 108.047 kg (238 lb 3.2 oz)     SpO2: 97% 84% 95% 98%    Intake/Output Summary (Last 24 hours) at 11/26/15 1458 Last data filed at 11/26/15 1439  Gross per 24 hour  Intake    240 ml  Output   2740 ml  Net  -2500 ml   Filed Weights   11/24/15 1401 11/25/15 0514 11/26/15 0513  Weight: 110.904 kg (244 lb 8 oz) 110.678 kg (244 lb) 108.047 kg (238 lb 3.2 oz)    Examination:  General exam: Appears calm and comfortable  Respiratory system: Respiratory effort normal.Crackles at bases and diminished breath sounds at bases.  Cardiovascular system: S1 & S2 heard, RRR. No rubs, gallops or clicks.  Gastrointestinal system: Abdomen is nondistended, soft and nontender.  Central nervous system: Alert and oriented. No focal neurological deficits.  Extremities: Bilateral LE edema +2, worse in RLE, chronic venous stasis changes bilaterally  Psychiatry: Judgement and insight appear normal. Mood & affect appropriate.    Data Reviewed: I have personally reviewed following labs and imaging studies  CBC:  Recent Labs Lab 11/24/15 0657 11/25/15 0313 11/26/15 0309  WBC 11.4* 7.1 8.1  NEUTROABS 9.0*  --   --   HGB 11.2* 10.9* 11.3*  HCT 37.2 36.5 37.9  MCV 96.4 96.8 96.9  PLT 213 205 [id number]   Basic Metabolic Panel:  Recent Labs Lab 11/24/15 0215 11/25/15 0313 11/26/15 0309  NA 138 140 139  K 4.0 2.9* 3.1*  CL 101 99* 97*  CO2 26 31 31   GLUCOSE 219* 111* 122*  BUN 13 14 18   CREATININE 1.03* 0.95 0.93  CALCIUM 10.2 8.9 9.3   Liver Function Tests:  Recent Labs Lab  11/25/15 0313  AST 28  ALT 16  ALKPHOS 53  BILITOT 1.0  PROT 6.5  ALBUMIN 3.7   Cardiac Enzymes:  Recent Labs Lab 11/24/15 0840 11/24/15 1136 11/24/15 1416  TROPONINI 0.14* 0.13* 0.14*   CBG:  Recent Labs Lab 11/25/15 1219 11/25/15 1644 11/25/15 2029 11/26/15 0025 11/26/15 0435  GLUCAP 125* 131* 163* 117* 131*   Urine analysis:    Component Value Date/Time   COLORURINE YELLOW 11/25/2015 0813   APPEARANCEUR CLOUDY* 11/25/2015 0813   LABSPEC 1.014 11/25/2015 0813   PHURINE 5.5 11/25/2015 0813   GLUCOSEU NEGATIVE 11/25/2015 0813   HGBUR TRACE* 11/25/2015 0813   BILIRUBINUR NEGATIVE 11/25/2015 0813   KETONESUR NEGATIVE 11/25/2015 0813   PROTEINUR NEGATIVE 11/25/2015 0813   UROBILINOGEN 0.2 11/21/2012 1750   NITRITE NEGATIVE 11/25/2015 0813   LEUKOCYTESUR LARGE* 11/25/2015 0813    Recent Results (from the past 240 hour(s))  Urine culture     Status: Abnormal   Collection Time: 11/24/15  9:25 AM  Result Value Ref Range Status   Specimen Description URINE, CLEAN CATCH  Final   Special Requests NONE  Final   Culture MULTIPLE SPECIES PRESENT, SUGGEST RECOLLECTION (A)  Final   Report Status 11/25/2015 FINAL  Final  MRSA PCR Screening     Status: None   Collection Time: 11/24/15  9:35 PM  Result Value Ref Range Status   MRSA by PCR NEGATIVE NEGATIVE Final    Comment:        The GeneXpert MRSA Assay (FDA approved for NASAL specimens only), is one component of a comprehensive MRSA colonization surveillance program. It is not intended to diagnose MRSA infection nor to guide or monitor treatment for MRSA infections.   Urine culture     Status: None   Collection Time: 11/25/15  8:13 AM  Result Value Ref Range Status   Specimen Description URINE, RANDOM  Final   Special Requests NONE  Final   Culture NO GROWTH  Final   Report Status 11/26/2015 FINAL  Final      Radiology Studies: No results found.    Scheduled Meds: . allopurinol  400 mg Oral Daily    . cefTRIAXone (ROCEPHIN)  IV  1 g Intravenous Q24H  . colestipol  2 g Oral Daily  . enoxaparin (LOVENOX) injection  40 mg Subcutaneous Q24H  . furosemide  60 mg Intravenous BID  . gabapentin  800 mg Oral TID  . guaiFENesin  600 mg Oral BID  . nortriptyline  25 mg Oral QHS  . potassium chloride  10 mEq Oral BID  . pravastatin  80 mg Oral QPM  .  sodium chloride flush  3 mL Intravenous Q12H   Continuous Infusions:     LOS: 1 day    Time spent: 20 minutes    Faye Ramsay, MD [facility] Pager [phone]  If 7PM-7AM, please contact night-coverage [email or web address] Password [email or web address] 11/26/2015, 2:58 PM

## 2015-11-26 NOTE — NC FL2 (Signed)
[place] LEVEL OF CARE SCREENING TOOL     IDENTIFICATION  Patient Name: Bianca [name] Birthdate: 1933/03/20 Sex: female Admission Date (Current Location): 11/24/2015  [facility] and [place] Number:  Herbalist and Address:  [facility], [place] [address], [place], [place] [address]      Provider Number: [id number]  Attending Physician Name and Address:  Theodis Blaze, MD  Relative Name and Phone Number:      Nicolia, Zirkle Relative [phone]  [phone]   [place], [place] [phone]  [phone]      Current Level of Care: Hospital Recommended Level of Care: [place] Prior Approval Number:    Date Approved/Denied:   PASRR Number: [id number]  Discharge Plan: SNF    Current Diagnoses: Patient Active Problem List   Diagnosis Date Noted  . Acute on chronic congestive heart failure ([place])   . CHF exacerbation ([place]) 11/24/2015  . Acute diastolic (congestive) heart failure ([place]) 11/24/2015  . Acute respiratory failure ([place]) 11/24/2015  . Physical deconditioning 11/24/2015  . Hyperglycemia 11/24/2015  . Peripheral neuropathy ([place]) 11/24/2015  . HLD (hyperlipidemia) 11/24/2015  . Depression 11/24/2015  . Chest pain 11/24/2015  . Neuropathy involving both lower extremities ([place])   . Paroxysmal atrial fibrillation (HCC)   . OSA (obstructive sleep apnea)   . Acute on chronic combined systolic and diastolic heart failure ([place])   . E. coli UTI   . Acute blood loss anemia   . Subcutaneous hematoma   . Acute on chronic diastolic (congestive) heart failure ([place])   . Fall 10/25/2015  . Afib ([place]) 01/19/2015  . Long-term (current) use of anticoagulants 07/20/2014  . Persistent atrial fibrillation ([place]) 07/20/2014  . Encounter for therapeutic drug monitoring 07/16/2013  . Chronic systolic heart failure ([place]) 07/16/2013  . History of implantable cardioverter-defibrillator (ICD) placement 07/16/2013  .  Morbid obesity ([place]) 07/16/2013  . Chronic anticoagulation 07/16/2013  . Knee osteoarthritis 07/16/2013  . Acute systolic heart failure ([place])   . HYPOKALEMIA 08/09/2010  . Neuropathy ([place]) 08/09/2010  . Chronic combined systolic and diastolic congestive heart failure ([place]) 08/09/2010  . Automatic implantable cardioverter-defibrillator in situ 08/09/2010    Orientation RESPIRATION BLADDER Height & Weight     Self, Time, Situation, Place  O2 (2 L per minute) Continent Weight: 238 lb 3.2 oz (108.047 kg) Height:  5\' 6"  (167.6 cm)  BEHAVIORAL SYMPTOMS/MOOD NEUROLOGICAL BOWEL NUTRITION STATUS      Continent Diet (Regular)  AMBULATORY STATUS COMMUNICATION OF NEEDS Skin   Limited Assist Verbally Normal                       Personal Care Assistance Level of Assistance  Bathing, Dressing Bathing Assistance: Limited assistance   Dressing Assistance: Limited assistance     Functional Limitations Info  Sight, Hearing, Speech Sight Info: Adequate Hearing Info: Adequate Speech Info: Adequate    SPECIAL CARE FACTORS FREQUENCY  PT (By licensed PT), OT (By licensed OT)     PT Frequency: 5x a week OT Frequency: 5x a week            Contractures Contractures Info: Not present    Additional Factors Info  Code Status, Allergies Code Status Info: Full code Allergies Info: Quinapril Hcl, Neosporin           Current Medications (11/26/2015):  This is the current hospital active medication list Current Facility-Administered Medications  Medication Dose Route Frequency Provider Last Rate Last  Dose  . acetaminophen (TYLENOL) tablet 650 mg  650 mg Oral Q6H PRN Theodis Blaze, MD   650 mg at 11/26/15 1615  . allopurinol (ZYLOPRIM) tablet 400 mg  400 mg Oral Daily Waldemar Dickens, MD   400 mg at 11/26/15 0959  . cefTRIAXone (ROCEPHIN) 1 g in dextrose 5 % 50 mL IVPB  1 g Intravenous Q24H Theodis Blaze, MD   1 g at 11/26/15 1616  . colestipol (COLESTID) tablet 2 g  2 g Oral Daily  Waldemar Dickens, MD   2 g at 11/26/15 1000  . enoxaparin (LOVENOX) injection 40 mg  40 mg Subcutaneous Q24H Waldemar Dickens, MD   40 mg at 11/26/15 1000  . furosemide (LASIX) injection 60 mg  60 mg Intravenous BID Samella Parr, NP   60 mg at 11/26/15 1616  . gabapentin (NEURONTIN) capsule 800 mg  800 mg Oral TID Norval Morton, MD   800 mg at 11/26/15 1615  . gi cocktail (Maalox,Lidocaine,Donnatal)  30 mL Oral QID PRN Waldemar Dickens, MD      . guaiFENesin [facility] 12 hr tablet 600 mg  600 mg Oral BID Theodis Blaze, MD   600 mg at 11/26/15 0959  . guaiFENesin-dextromethorphan (ROBITUSSIN DM) 100-10 MG/5ML syrup 5 mL  5 mL Oral Q4H PRN Theodis Blaze, MD   5 mL at 11/26/15 0959  . nortriptyline (PAMELOR) capsule 25 mg  25 mg Oral QHS Waldemar Dickens, MD   25 mg at 11/25/15 2300  . ondansetron (ZOFRAN) tablet 4 mg  4 mg Oral Q6H PRN Waldemar Dickens, MD       Or  . ondansetron [facility]) injection 4 mg  4 mg Intravenous Q6H PRN Waldemar Dickens, MD      . potassium chloride (K-DUR) CR tablet 10 mEq  10 mEq Oral BID Waldemar Dickens, MD   10 mEq at 11/26/15 0959  . pravastatin (PRAVACHOL) tablet 80 mg  80 mg Oral QPM Waldemar Dickens, MD   80 mg at 11/26/15 1616  . sodium chloride flush (NS) 0.9 % injection 3 mL  3 mL Intravenous Q12H Waldemar Dickens, MD   3 mL at 11/26/15 1000     Discharge Medications: Please see discharge summary for a list of discharge medications.  Relevant Imaging Results:  Relevant Lab Results:   Additional Information SSN [id number]  Ross Ludwig, [place]

## 2015-11-26 NOTE — Progress Notes (Signed)
Patient refused bed alarm. Well continue to monitor.

## 2015-11-27 DIAGNOSIS — N3000 Acute cystitis without hematuria: Secondary | ICD-10-CM | POA: Insufficient documentation

## 2015-11-27 LAB — BASIC METABOLIC PANEL
Anion gap: 10 (ref 5–15)
BUN: 14 mg/dL (ref 6–20)
CHLORIDE: 99 mmol/L — AB (ref 101–111)
CO2: 30 mmol/L (ref 22–32)
Calcium: 9.2 mg/dL (ref 8.9–10.3)
Creatinine, Ser: 0.9 mg/dL (ref 0.44–1.00)
GFR calc Af Amer: >60 mL/min (ref >60)
GFR calc non Af Amer: 58 mL/min — ABNORMAL LOW (ref >60)
GLUCOSE: 123 mg/dL — AB (ref 65–99)
POTASSIUM: 3.4 mmol/L — AB (ref 3.5–5.1)
Sodium: 139 mmol/L (ref 135–145)

## 2015-11-27 LAB — CBC
HEMATOCRIT: 37.2 % (ref 36.0–46.0)
Hemoglobin: 11.2 g/dL — ABNORMAL LOW (ref 12.0–15.0)
MCH: 29.1 pg (ref 26.0–34.0)
MCHC: 30.1 g/dL (ref 30.0–36.0)
MCV: 96.6 fL (ref 78.0–100.0)
Platelets: 225 10*3/uL (ref 150–400)
RBC: 3.85 MIL/uL — ABNORMAL LOW (ref 3.87–5.11)
RDW: 17.8 % — AB (ref 11.5–15.5)
WBC: 6.6 10*3/uL (ref 4.0–10.5)

## 2015-11-27 MED ORDER — POTASSIUM CHLORIDE CRYS ER 20 MEQ PO TBCR
40.0000 meq | EXTENDED_RELEASE_TABLET | Freq: Once | ORAL | Status: AC
  Administered 2015-11-27: 40 meq via ORAL
  Filled 2015-11-27: qty 2

## 2015-11-27 NOTE — Progress Notes (Signed)
Patient ID: Bianca [name], female   DOB: [birth year], 82 y.o.   MRN: [id number]   Patient Name: Bianca [name] Date of Encounter: 11/27/2015  Hospital Problem List     Active Problems:   Paroxysmal atrial fibrillation (HCC)   CHF exacerbation (HCC)   Acute diastolic (congestive) heart failure (HCC)   Acute respiratory failure (HCC)   Physical deconditioning   Hyperglycemia   Peripheral neuropathy (HCC)   HLD (hyperlipidemia)   Depression   Chest pain   Acute on chronic congestive heart failure ([place])    Subjective   Much more spry ambulating on antibiotics for UTI now   Inpatient Medications    . allopurinol  400 mg Oral Daily  . cefTRIAXone (ROCEPHIN)  IV  1 g Intravenous Q24H  . colestipol  2 g Oral Daily  . enoxaparin (LOVENOX) injection  40 mg Subcutaneous Q24H  . furosemide  60 mg Intravenous BID  . gabapentin  800 mg Oral TID  . guaiFENesin  600 mg Oral BID  . nortriptyline  25 mg Oral QHS  . potassium chloride  10 mEq Oral BID  . pravastatin  80 mg Oral QPM  . sodium chloride flush  3 mL Intravenous Q12H    Vital Signs    Filed Vitals:   11/26/15 1140 11/26/15 1952 11/26/15 2313 11/27/15 0509  BP: 119/68 130/75  133/77  Pulse: 100 93 90 108  Temp: 99.9 F (37.7 C) 98.6 F (37 C)  98.4 F (36.9 C)  TempSrc: Oral Oral  Oral  Resp: 18 18 16 16   Height:      Weight:    107.865 kg (237 lb 12.8 oz)  SpO2: 98% 98%  95%    Intake/Output Summary (Last 24 hours) at 11/27/15 1032 Last data filed at 11/27/15 0900  Gross per 24 hour  Intake    820 ml  Output   1800 ml  Net   -980 ml   Filed Weights   11/25/15 0514 11/26/15 0513 11/27/15 0509  Weight: 110.678 kg (244 lb) 108.047 kg (238 lb 3.2 oz) 107.865 kg (237 lb 12.8 oz)    Physical Exam    General: Pleasant obese female, NAD. Neuro: Alert and oriented X 3. Moves all extremities spontaneously. Psych: Normal affect. HEENT:  Normal  Neck: Supple without bruits or JVD. Lungs:  Resp regular and  unlabored, Mild crackles bilateral lower lobes. Heart: RRR no s3, s4, or murmurs. Abdomen: Soft, non-tender, non-distended, BS + x 4. Large distended, healing hematoma to right flank.  Extremities: No clubbing, cyanosis 2+ edema. With stasis changes  DP/PT/Radials 2+ and equal bilaterally.  Labs    CBC  Recent Labs  11/26/15 0309 11/27/15 0325  WBC 8.1 6.6  HGB 11.3* 11.2*  HCT 37.9 37.2  MCV 96.9 96.6  PLT 237 [id number]   Basic Metabolic Panel  Recent Labs  11/26/15 0309 11/27/15 0325  NA 139 139  K 3.1* 3.4*  CL 97* 99*  CO2 31 30  GLUCOSE 122* 123*  BUN 18 14  CREATININE 0.93 0.90  CALCIUM 9.3 9.2   Liver Function Tests  Recent Labs  11/25/15 0313  AST 28  ALT 16  ALKPHOS 53  BILITOT 1.0  PROT 6.5  ALBUMIN 3.7   Cardiac Enzymes  Recent Labs  11/24/15 1136 11/24/15 1416  TROPONINI 0.13* 0.14*     Telemetry    V-paced Rate-100s-120s. 7 beat run if NSVT  ECG    V-paced Rate-96 11/27/2015   Radiology  Dg Chest Port 1 View  11/24/2015  CLINICAL DATA:  Initial evaluation for acute chest pain and shortness of breath. EXAM: PORTABLE CHEST 1 VIEW COMPARISON:  Prior radiograph from 11/01/2015. FINDINGS: Left-sided transvenous pacemaker/ AICD in place, stable. Cardiomegaly unchanged. Mediastinal silhouette within normal limits. Lungs are hypoinflated. Moderate diffuse pulmonary edema present. Evaluation limited for pleural effusion. No definite focal infiltrates. No pneumothorax. No acute osseous abnormality. IMPRESSION: Cardiomegaly with moderate diffuse pulmonary edema, consistent with acute CHF exacerbation. Electronically Signed   By: Jeannine Boga M.D.   On: 11/24/2015 02:42   Assessment & Plan    Ms. Winegar is a 82 year old female with a past medical history of HTN,PAF, chronic systolic CHF, NICM s/p BiV ICD.  She presented to the emergency room on 11/24/2015 with complaints of sudden onset chest pain and ongoing shortness of breath. Her BNP  is elevated and chest x-ray shows pulmonary edema. She required short-term BiPAP currently on supplemental O2 via nasal cannula. She is received one dose of IV Lasix in the ED and diuresed 1.2 L.  1. Acute on chronic diastolic CHF:  --Currently on 60mg  IV lasix BID. Cr trend improved from 1.03>>0.95. UOP 2.7L yesterday. Weight 248>>244Lbs since yesterday --Troponin is mildly elevated at 0.13>>0.13>>0.14 in the setting of CHF exacerbation. --She had a left heart cath in May of 2012 that showed no CAD. EF at that time was 25-30%. EF has since improved to 50-55% per Echo last month. --No need for repeat echo as she had one less than a month ago. --Continue with IV lasix, daily weights and I&Os Change to Po lasix in am   2. PAF: Patient was on Coumadin before she had her fall that resulted in severe right flank hematoma in May of 2017. She was hospitalized and required FFP to reverse her INR and PRBC's for blood loss anemia. Continue to hold Coumadin. Stated yesterday she would like to consider Eliquis in the future. Renal function stable. Would not restart at this time   3. HLD: On high intensity statin, continue same.  4. HTN: Given her diastolic dysfunction, and elevated HR Lopressor added yesterday  5. Fever: Resolved on iv rocephin now for UTI with improvement  Spoke with son suspect she will be ready for d/c Tuesday   Jenkins Rouge

## 2015-11-27 NOTE — Progress Notes (Signed)
Patient ID: Bianca [name], female   DOB: [birth year], 82 y.o.   MRN: [id number]    PROGRESS NOTE    Bianca [name]  [id number] DOB: 1932-09-19 DOA: 11/24/2015  PCP: Jonathon Bellows, MD   Brief Narrative:  82 y.o. female with medical history of MI, CHF, HTN, anemia status post pacemaker/defibrilator placement, presenting after acute onset SOB and CP. Pt has been resident at [facility] place for the last 20 days. PT explained that episode occurred last night when she got up to go to the restroom.   In ED, she required BiPAP, chest x-ray shows cardiomegaly with moderate diffuse pulmonary edema consistent with CHF exacerbation. Her weight on admission is 244 pounds. She was given 60 mg of IV Lasix in the emergency room. She has diuresed 1.2 L.  Her last echo in May 2017, EF 50-55%.   Assessment & Plan:   Acute on chronic diastolic CHF - reports feeling better this am - appreciate  cardiology team following - continue with lasix IV for now and change to PO in AM - monitor daily weights, strict I/O - she had left heart cath May 2012 that showed no CAD. EF 25-30%. EF has since improved to 50-55% per Echo last month.  - weight down from 248 --> 238 --> 237 lbs this AM   PAF - coumadin has been on hold due to recent bleeding post fall episode   - no plan to restart Eliquis at this time   Hypokalemia  - from Lasix - still low, continue to supplement - BMP in AM  UTI - continue Rocephin day #2 - follow up on urine cultures   HLD - On high dose statin, continue same  Essential HTN - reasonable inpatient control   Morbid obesity due to excess calories  - Body mass index is 39.4 kg/(m^2)  DVT prophylaxis: Lovenox Sq Code Status: Full  Family Communication: Patient and daughters at bedside  Disposition Plan: Home by 6/13  Consultants:   Cardiology   Procedures:   None  Antimicrobials:   Rocephin 6/10 -->  Subjective: Reports feeling better.   Objective: Filed  Vitals:   11/26/15 1952 11/26/15 2313 11/27/15 0509 11/27/15 1147  BP: 130/75  133/77 128/76  Pulse: 93 90 108 112  Temp: 98.6 F (37 C)  98.4 F (36.9 C) 98.6 F (37 C)  TempSrc: Oral  Oral Oral  Resp: 18 16 16 18   Height:      Weight:   107.865 kg (237 lb 12.8 oz)   SpO2: 98%  95% 96%    Intake/Output Summary (Last 24 hours) at 11/27/15 1325 Last data filed at 11/27/15 1038  Gross per 24 hour  Intake    500 ml  Output   1400 ml  Net   -900 ml   Filed Weights   11/25/15 0514 11/26/15 0513 11/27/15 0509  Weight: 110.678 kg (244 lb) 108.047 kg (238 lb 3.2 oz) 107.865 kg (237 lb 12.8 oz)    Examination:  General exam: Appears calm and comfortable  Respiratory system: Respiratory effort normal.diminished breath sounds at bases  Cardiovascular system: S1 & S2 heard, tachycardic. No rubs, gallops or clicks.  Gastrointestinal system: Abdomen is nondistended, soft and nontender.  Central nervous system: Alert and oriented. No focal neurological deficits. Extremities: Bilateral LE edema +2, worse in RLE, chronic venous stasis changes bilaterally  Psychiatry: Judgement and insight appear normal. Mood & affect appropriate.    Data Reviewed: I have personally reviewed following labs  and imaging studies  CBC:  Recent Labs Lab 11/24/15 0657 11/25/15 0313 11/26/15 0309 11/27/15 0325  WBC 11.4* 7.1 8.1 6.6  NEUTROABS 9.0*  --   --   --   HGB 11.2* 10.9* 11.3* 11.2*  HCT 37.2 36.5 37.9 37.2  MCV 96.4 96.8 96.9 96.6  PLT 213 205 237 [id number]   Basic Metabolic Panel:  Recent Labs Lab 11/24/15 0215 11/25/15 0313 11/26/15 0309 11/27/15 0325  NA 138 140 139 139  K 4.0 2.9* 3.1* 3.4*  CL 101 99* 97* 99*  CO2 26 31 31 30   GLUCOSE 219* 111* 122* 123*  BUN 13 14 18 14   CREATININE 1.03* 0.95 0.93 0.90  CALCIUM 10.2 8.9 9.3 9.2   Liver Function Tests:  Recent Labs Lab 11/25/15 0313  AST 28  ALT 16  ALKPHOS 53  BILITOT 1.0  PROT 6.5  ALBUMIN 3.7   Cardiac  Enzymes:  Recent Labs Lab 11/24/15 0840 11/24/15 1136 11/24/15 1416  TROPONINI 0.14* 0.13* 0.14*   CBG:  Recent Labs Lab 11/25/15 1219 11/25/15 1644 11/25/15 2029 11/26/15 0025 11/26/15 0435  GLUCAP 125* 131* 163* 117* 131*   Urine analysis:    Component Value Date/Time   COLORURINE YELLOW 11/25/2015 0813   APPEARANCEUR CLOUDY* 11/25/2015 0813   LABSPEC 1.014 11/25/2015 0813   PHURINE 5.5 11/25/2015 0813   GLUCOSEU NEGATIVE 11/25/2015 0813   HGBUR TRACE* 11/25/2015 0813   BILIRUBINUR NEGATIVE 11/25/2015 0813   KETONESUR NEGATIVE 11/25/2015 0813   PROTEINUR NEGATIVE 11/25/2015 0813   UROBILINOGEN 0.2 11/21/2012 1750   NITRITE NEGATIVE 11/25/2015 0813   LEUKOCYTESUR LARGE* 11/25/2015 0813    Recent Results (from the past 240 hour(s))  Urine culture     Status: Abnormal   Collection Time: 11/24/15  9:25 AM  Result Value Ref Range Status   Specimen Description URINE, CLEAN CATCH  Final   Special Requests NONE  Final   Culture MULTIPLE SPECIES PRESENT, SUGGEST RECOLLECTION (A)  Final   Report Status 11/25/2015 FINAL  Final  MRSA PCR Screening     Status: None   Collection Time: 11/24/15  9:35 PM  Result Value Ref Range Status   MRSA by PCR NEGATIVE NEGATIVE Final    Comment:        The GeneXpert MRSA Assay (FDA approved for NASAL specimens only), is one component of a comprehensive MRSA colonization surveillance program. It is not intended to diagnose MRSA infection nor to guide or monitor treatment for MRSA infections.   Urine culture     Status: None   Collection Time: 11/25/15  8:13 AM  Result Value Ref Range Status   Specimen Description URINE, RANDOM  Final   Special Requests NONE  Final   Culture NO GROWTH  Final   Report Status 11/26/2015 FINAL  Final      Radiology Studies: No results found.    Scheduled Meds: . allopurinol  400 mg Oral Daily  . cefTRIAXone (ROCEPHIN)  IV  1 g Intravenous Q24H  . colestipol  2 g Oral Daily  .  enoxaparin (LOVENOX) injection  40 mg Subcutaneous Q24H  . furosemide  60 mg Intravenous BID  . gabapentin  800 mg Oral TID  . guaiFENesin  600 mg Oral BID  . nortriptyline  25 mg Oral QHS  . potassium chloride  10 mEq Oral BID  . pravastatin  80 mg Oral QPM  . sodium chloride flush  3 mL Intravenous Q12H   Continuous Infusions:  LOS: 2 days    Time spent: 20 minutes    Faye Ramsay, MD [facility] Pager [phone]  If 7PM-7AM, please contact night-coverage [email or web address] Password [email or web address] 11/27/2015, 1:25 PM

## 2015-11-28 DIAGNOSIS — N3000 Acute cystitis without hematuria: Secondary | ICD-10-CM

## 2015-11-28 DIAGNOSIS — I5043 Acute on chronic combined systolic (congestive) and diastolic (congestive) heart failure: Secondary | ICD-10-CM

## 2015-11-28 LAB — CBC
HEMATOCRIT: 37.9 % (ref 36.0–46.0)
Hemoglobin: 11.5 g/dL — ABNORMAL LOW (ref 12.0–15.0)
MCH: 29.6 pg (ref 26.0–34.0)
MCHC: 30.3 g/dL (ref 30.0–36.0)
MCV: 97.7 fL (ref 78.0–100.0)
PLATELETS: 239 10*3/uL (ref 150–400)
RBC: 3.88 MIL/uL (ref 3.87–5.11)
RDW: 17.4 % — AB (ref 11.5–15.5)
WBC: 6.4 10*3/uL (ref 4.0–10.5)

## 2015-11-28 LAB — BASIC METABOLIC PANEL
Anion gap: 10 (ref 5–15)
BUN: 16 mg/dL (ref 6–20)
CALCIUM: 9.1 mg/dL (ref 8.9–10.3)
CO2: 33 mmol/L — AB (ref 22–32)
CREATININE: 0.97 mg/dL (ref 0.44–1.00)
Chloride: 98 mmol/L — ABNORMAL LOW (ref 101–111)
GFR calc non Af Amer: 53 mL/min — ABNORMAL LOW (ref >60)
GLUCOSE: 119 mg/dL — AB (ref 65–99)
Potassium: 3.5 mmol/L (ref 3.5–5.1)
Sodium: 141 mmol/L (ref 135–145)

## 2015-11-28 LAB — HEMOGLOBIN A1C
Hgb A1c MFr Bld: 5 % (ref 4.8–5.6)
MEAN PLASMA GLUCOSE: 97 mg/dL

## 2015-11-28 MED ORDER — FUROSEMIDE 40 MG PO TABS
40.0000 mg | ORAL_TABLET | Freq: Two times a day (BID) | ORAL | Status: DC
  Administered 2015-11-28 – 2015-11-29 (×2): 40 mg via ORAL
  Filled 2015-11-28 (×2): qty 1

## 2015-11-28 NOTE — Progress Notes (Signed)
Patient ID: Bianca [name], female   DOB: [birth year], 82 y.o.   MRN: [id number]   Patient Name: Bianca [name] Date of Encounter: 11/28/2015  Hospital Problem List     Active Problems:   Paroxysmal atrial fibrillation (HCC)   CHF exacerbation (HCC)   Acute diastolic (congestive) heart failure (HCC)   Acute respiratory failure (HCC)   Physical deconditioning   Hyperglycemia   Peripheral neuropathy (HCC)   HLD (hyperlipidemia)   Depression   Chest pain   Acute on chronic congestive heart failure ([place])   Acute cystitis without hematuria    Subjective   The patient feels better today she tries to walk with some shortness of breath no shortness of breath at rest and no chest pain.  Inpatient Medications    . allopurinol  400 mg Oral Daily  . cefTRIAXone (ROCEPHIN)  IV  1 g Intravenous Q24H  . colestipol  2 g Oral Daily  . enoxaparin (LOVENOX) injection  40 mg Subcutaneous Q24H  . furosemide  60 mg Intravenous BID  . gabapentin  800 mg Oral TID  . guaiFENesin  600 mg Oral BID  . nortriptyline  25 mg Oral QHS  . potassium chloride  10 mEq Oral BID  . pravastatin  80 mg Oral QPM  . sodium chloride flush  3 mL Intravenous Q12H    Vital Signs    Filed Vitals:   11/27/15 1953 11/28/15 0602 11/28/15 1016 11/28/15 1128  BP: 132/86 107/54  106/58  Pulse: 102 100  97  Temp: 98.7 F (37.1 C) 98.1 F (36.7 C)  98.2 F (36.8 C)  TempSrc: Oral Oral  Oral  Resp: 16 18  18   Height:      Weight:  235 lb 11.2 oz (106.913 kg)    SpO2: 97% 96% 96% 91%    Intake/Output Summary (Last 24 hours) at 11/28/15 1553 Last data filed at 11/28/15 1546  Gross per 24 hour  Intake    630 ml  Output   1350 ml  Net   -720 ml   Filed Weights   11/26/15 0513 11/27/15 0509 11/28/15 0602  Weight: 238 lb 3.2 oz (108.047 kg) 237 lb 12.8 oz (107.865 kg) 235 lb 11.2 oz (106.913 kg)    Physical Exam    General: Pleasant obese female, NAD. Neuro: Alert and oriented X 3. Moves all extremities  spontaneously. Psych: Normal affect. HEENT:  Normal  Neck: Supple without bruits or JVD. Lungs:  Resp regular and unlabored, Mild crackles bilateral lower lobes. Heart: RRR no s3, s4, or murmurs. Abdomen: Soft, non-tender, non-distended, BS + x 4. Large distended, healing hematoma to right flank.  Extremities: No clubbing, cyanosis 2+ edema. With stasis changes  DP/PT/Radials 2+ and equal bilaterally.  Labs    CBC  Recent Labs  11/27/15 0325 11/28/15 0240  WBC 6.6 6.4  HGB 11.2* 11.5*  HCT 37.2 37.9  MCV 96.6 97.7  PLT 225 [id number]   Basic Metabolic Panel  Recent Labs  11/27/15 0325 11/28/15 0240  NA 139 141  K 3.4* 3.5  CL 99* 98*  CO2 30 33*  GLUCOSE 123* 119*  BUN 14 16  CREATININE 0.90 0.97  CALCIUM 9.2 9.1    Telemetry    V-paced Rate-100s-120s. 7 beat run if NSVT  ECG    V-paced Rate-96 11/28/2015   Radiology    Dg Chest Port 1 View  11/24/2015  CLINICAL DATA:  Initial evaluation for acute chest pain and shortness of breath.  EXAM: PORTABLE CHEST 1 VIEW COMPARISON:  Prior radiograph from 11/01/2015. FINDINGS: Left-sided transvenous pacemaker/ AICD in place, stable. Cardiomegaly unchanged. Mediastinal silhouette within normal limits. Lungs are hypoinflated. Moderate diffuse pulmonary edema present. Evaluation limited for pleural effusion. No definite focal infiltrates. No pneumothorax. No acute osseous abnormality. IMPRESSION: Cardiomegaly with moderate diffuse pulmonary edema, consistent with acute CHF exacerbation. Electronically Signed   By: Jeannine Boga M.D.   On: 11/24/2015 02:42   Assessment & Plan    Bianca [name] is a 82 year old female with a past medical history of HTN,PAF, chronic systolic CHF, NICM s/p BiV ICD.  She presented to the emergency room on 11/24/2015 with complaints of sudden onset chest pain and ongoing shortness of breath. Her BNP is elevated and chest x-ray shows pulmonary edema. She required short-term BiPAP currently on  supplemental O2 via nasal cannula. She is received one dose of IV Lasix in the ED and diuresed 1.2 L.  1. Acute on chronic diastolic CHF:  --Currently on 60mg  IV lasix BID. Cr trend improved from 1.03>>0.95. UOP 2.7L yesterday. Weight 248>>235Lbs , neg 0.7 L since yesterday, weight at baseline - I would plan on discharge tomorrow with increased lasix compared to the admission dose Lasix 40 mg po daily --> 40 mg PO BID --Troponin is mildly elevated at 0.13>>0.13>>0.14 in the setting of CHF exacerbation. --She had a left heart cath in May of 2012 that showed no CAD. EF at that time was 25-30%. EF has since improved to 50-55% per Echo last month. --No need for repeat echo as she had one less than a month ago.  2. PAF: Patient was on Coumadin before she had her fall that resulted in severe right flank hematoma in May of 2017. She was hospitalized and required FFP to reverse her INR and PRBC's for blood loss anemia. Continue to hold Coumadin. Stated yesterday she would like to consider Eliquis in the future. Renal function stable. Would not restart at this time   3. HLD: On high intensity statin, continue same.  4. HTN: Given her diastolic dysfunction, and elevated HR Lopressor added yesterday  5. Fever: Resolved on iv rocephin now for UTI with improvement  Spoke with son suspect she will be ready for d/c Tuesday   Bianca Dawley, MD 11/28/2015

## 2015-11-28 NOTE — Care Management Important Message (Signed)
Important Message  Patient Details  Name: Bianca [name] MRN: [id number] Date of Birth: 06-Apr-1933   Medicare Important Message Given:  Yes    Loann Quill 11/28/2015, 1:49 PM

## 2015-11-28 NOTE — Clinical Social Work Note (Signed)
CSW has started the insurance authorization process.  Dayton Scrape, [place]

## 2015-11-28 NOTE — Progress Notes (Signed)
Patient ID: Bianca [name], female   DOB: 1933/03/05, 82 y.o.   MRN: [id number]    PROGRESS NOTE    Bianca [name]  [id number] DOB: [birth year] DOA: 11/24/2015  PCP: Jonathon Bellows, MD   Brief Narrative:  82 y.o. female with medical history of MI, CHF, HTN, anemia status post pacemaker/defibrilator placement, presenting after acute onset SOB and CP. Pt has been resident at [facility] place for the last 20 days. PT explained that episode occurred last night when she got up to go to the restroom.   In ED, she required BiPAP, chest x-ray shows cardiomegaly with moderate diffuse pulmonary edema consistent with CHF exacerbation. Her weight on admission is 244 pounds. She was given 60 mg of IV Lasix in the emergency room. She has diuresed 1.2 L.  Her last echo in May 2017, EF 50-55%.   Assessment & Plan:   Acute on chronic diastolic CHF - reports feeling better this am - appreciate  cardiology team following - continue with lasix per cardiology team  - monitor daily weights, strict I/O - she had left heart cath May 2012 that showed no CAD. EF 25-30%. EF has since improved to 50-55% per Echo last month.  - weight down from 248 --> 238 --> 237 --> 235 lbs this AM   PAF - coumadin has been on hold due to recent bleeding post fall episode   - no plan to restart Eliquis at this time per cardiology   Hypokalemia  - from Lasix - on low end of normal, will continue to supplement to keep K ~4 - BMP in AM  UTI - continue Rocephin day #3/5 - follow up on urine cultures   HLD - On high dose statin, continue same  Essential HTN - reasonable inpatient control   Morbid obesity due to excess calories  - Body mass index is 39.4 kg/(m^2)  DVT prophylaxis: Lovenox Sq Code Status: Full  Family Communication: Patient and daughters at bedside  Disposition Plan: Home by 6/13 if cardiology team [facility] with that   Consultants:   Cardiology   Procedures:   None  Antimicrobials:   Rocephin  6/10 -->  Subjective: Reports feeling better.   Objective: Filed Vitals:   11/27/15 1953 11/28/15 0602 11/28/15 1016 11/28/15 1128  BP: 132/86 107/54  106/58  Pulse: 102 100  97  Temp: 98.7 F (37.1 C) 98.1 F (36.7 C)  98.2 F (36.8 C)  TempSrc: Oral Oral  Oral  Resp: 16 18  18   Height:      Weight:  106.913 kg (235 lb 11.2 oz)    SpO2: 97% 96% 96% 91%    Intake/Output Summary (Last 24 hours) at 11/28/15 1214 Last data filed at 11/28/15 1018  Gross per 24 hour  Intake    800 ml  Output   1150 ml  Net   -350 ml   Filed Weights   11/26/15 0513 11/27/15 0509 11/28/15 0602  Weight: 108.047 kg (238 lb 3.2 oz) 107.865 kg (237 lb 12.8 oz) 106.913 kg (235 lb 11.2 oz)    Examination:  General exam: Appears calm and comfortable, was walking in the room and no discomfort noted with exertion  Respiratory system: Respiratory effort normal, better air movement bilaterally Cardiovascular system: S1 & S2 heard. No rubs, gallops or clicks.  Gastrointestinal system: Abdomen is nondistended, soft and nontender.  Central nervous system: Alert and oriented. No focal neurological deficits. Extremities: Bilateral LE edema +1-2, worse in RLE, chronic venous  stasis changes bilaterally  Psychiatry: Judgement and insight appear normal. Mood & affect appropriate.    Data Reviewed: I have personally reviewed following labs and imaging studies  CBC:  Recent Labs Lab 11/24/15 0657 11/25/15 0313 11/26/15 0309 11/27/15 0325 11/28/15 0240  WBC 11.4* 7.1 8.1 6.6 6.4  NEUTROABS 9.0*  --   --   --   --   HGB 11.2* 10.9* 11.3* 11.2* 11.5*  HCT 37.2 36.5 37.9 37.2 37.9  MCV 96.4 96.8 96.9 96.6 97.7  PLT 213 205 237 225 [id number]   Basic Metabolic Panel:  Recent Labs Lab 11/24/15 0215 11/25/15 0313 11/26/15 0309 11/27/15 0325 11/28/15 0240  NA 138 140 139 139 141  K 4.0 2.9* 3.1* 3.4* 3.5  CL 101 99* 97* 99* 98*  CO2 26 31 31 30  33*  GLUCOSE 219* 111* 122* 123* 119*  BUN 13 14 18 14 16    CREATININE 1.03* 0.95 0.93 0.90 0.97  CALCIUM 10.2 8.9 9.3 9.2 9.1   Liver Function Tests:  Recent Labs Lab 11/25/15 0313  AST 28  ALT 16  ALKPHOS 53  BILITOT 1.0  PROT 6.5  ALBUMIN 3.7   Cardiac Enzymes:  Recent Labs Lab 11/24/15 0840 11/24/15 1136 11/24/15 1416  TROPONINI 0.14* 0.13* 0.14*   CBG:  Recent Labs Lab 11/25/15 1219 11/25/15 1644 11/25/15 2029 11/26/15 0025 11/26/15 0435  GLUCAP 125* 131* 163* 117* 131*   Urine analysis:    Component Value Date/Time   COLORURINE YELLOW 11/25/2015 0813   APPEARANCEUR CLOUDY* 11/25/2015 0813   LABSPEC 1.014 11/25/2015 0813   PHURINE 5.5 11/25/2015 0813   GLUCOSEU NEGATIVE 11/25/2015 0813   HGBUR TRACE* 11/25/2015 0813   BILIRUBINUR NEGATIVE 11/25/2015 0813   KETONESUR NEGATIVE 11/25/2015 0813   PROTEINUR NEGATIVE 11/25/2015 0813   UROBILINOGEN 0.2 11/21/2012 1750   NITRITE NEGATIVE 11/25/2015 0813   LEUKOCYTESUR LARGE* 11/25/2015 0813    Recent Results (from the past 240 hour(s))  Urine culture     Status: Abnormal   Collection Time: 11/24/15  9:25 AM  Result Value Ref Range Status   Specimen Description URINE, CLEAN CATCH  Final   Special Requests NONE  Final  MRSA PCR Screening     Status: None   Collection Time: 11/24/15  9:35 PM  Urine culture     Status: None   Collection Time: 11/25/15  8:13 AM  Result Value Ref Range Status   Specimen Description URINE, RANDOM  Final   Special Requests NONE  Final   Culture NO GROWTH  Final   Report Status 11/26/2015 FINAL  Final     Radiology Studies: No results found.   Scheduled Meds: . allopurinol  400 mg Oral Daily  . cefTRIAXone (ROCEPHIN)  IV  1 g Intravenous Q24H  . colestipol  2 g Oral Daily  . enoxaparin (LOVENOX) injection  40 mg Subcutaneous Q24H  . furosemide  60 mg Intravenous BID  . gabapentin  800 mg Oral TID  . guaiFENesin  600 mg Oral BID  . nortriptyline  25 mg Oral QHS  . potassium chloride  10 mEq Oral BID  . pravastatin  80 mg  Oral QPM  . sodium chloride flush  3 mL Intravenous Q12H   Continuous Infusions:     LOS: 3 days    Time spent: 20 minutes    Faye Ramsay, MD [facility] Pager [phone]  If 7PM-7AM, please contact night-coverage [email or web address] Password [facility] 11/28/2015, 12:14 PM

## 2015-11-29 DIAGNOSIS — N3000 Acute cystitis without hematuria: Secondary | ICD-10-CM | POA: Diagnosis not present

## 2015-11-29 DIAGNOSIS — I480 Paroxysmal atrial fibrillation: Secondary | ICD-10-CM | POA: Diagnosis not present

## 2015-11-29 DIAGNOSIS — R2681 Unsteadiness on feet: Secondary | ICD-10-CM | POA: Diagnosis not present

## 2015-11-29 DIAGNOSIS — M6281 Muscle weakness (generalized): Secondary | ICD-10-CM | POA: Diagnosis not present

## 2015-11-29 DIAGNOSIS — J9601 Acute respiratory failure with hypoxia: Secondary | ICD-10-CM | POA: Diagnosis not present

## 2015-11-29 DIAGNOSIS — E785 Hyperlipidemia, unspecified: Secondary | ICD-10-CM | POA: Diagnosis not present

## 2015-11-29 DIAGNOSIS — I509 Heart failure, unspecified: Secondary | ICD-10-CM | POA: Diagnosis not present

## 2015-11-29 DIAGNOSIS — I10 Essential (primary) hypertension: Secondary | ICD-10-CM | POA: Diagnosis not present

## 2015-11-29 DIAGNOSIS — R0789 Other chest pain: Secondary | ICD-10-CM | POA: Diagnosis not present

## 2015-11-29 DIAGNOSIS — I5031 Acute diastolic (congestive) heart failure: Secondary | ICD-10-CM | POA: Diagnosis not present

## 2015-11-29 DIAGNOSIS — I5033 Acute on chronic diastolic (congestive) heart failure: Secondary | ICD-10-CM | POA: Diagnosis not present

## 2015-11-29 DIAGNOSIS — Z5189 Encounter for other specified aftercare: Secondary | ICD-10-CM | POA: Diagnosis not present

## 2015-11-29 DIAGNOSIS — R262 Difficulty in walking, not elsewhere classified: Secondary | ICD-10-CM | POA: Diagnosis not present

## 2015-11-29 LAB — GLUCOSE, CAPILLARY
Glucose-Capillary: 108 mg/dL — ABNORMAL HIGH (ref 65–99)
Glucose-Capillary: 113 mg/dL — ABNORMAL HIGH (ref 65–99)

## 2015-11-29 LAB — BASIC METABOLIC PANEL
ANION GAP: 12 (ref 5–15)
BUN: 18 mg/dL (ref 6–20)
CHLORIDE: 99 mmol/L — AB (ref 101–111)
CO2: 27 mmol/L (ref 22–32)
Calcium: 9.4 mg/dL (ref 8.9–10.3)
Creatinine, Ser: 0.85 mg/dL (ref 0.44–1.00)
GFR calc Af Amer: >60 mL/min (ref >60)
GLUCOSE: 114 mg/dL — AB (ref 65–99)
POTASSIUM: 3.5 mmol/L (ref 3.5–5.1)
Sodium: 138 mmol/L (ref 135–145)

## 2015-11-29 MED ORDER — GUAIFENESIN ER 600 MG PO TB12: 600.0000 mg | ORAL_TABLET | Freq: Two times a day (BID) | ORAL | Status: DC

## 2015-11-29 MED ORDER — OXYCODONE-ACETAMINOPHEN 5-325 MG PO TABS: 1.0000 | ORAL_TABLET | ORAL | Status: DC | PRN

## 2015-11-29 MED ORDER — FUROSEMIDE 40 MG PO TABS: 40.0000 mg | ORAL_TABLET | Freq: Two times a day (BID) | ORAL | Status: DC

## 2015-11-29 MED ORDER — GABAPENTIN 600 MG PO TABS
600.0000 mg | ORAL_TABLET | Freq: Two times a day (BID) | ORAL | Status: DC
Start: 2015-11-29 — End: 2015-11-30

## 2015-11-29 MED ORDER — GABAPENTIN 600 MG PO TABS: 600.0000 mg | ORAL_TABLET | Freq: Three times a day (TID) | ORAL | Status: DC

## 2015-11-29 NOTE — Discharge Summary (Addendum)
Physician Discharge Summary  Bianca [name] [id number] DOB: December 20, 1932 DOA: 11/24/2015  PCP: Jonathon Bellows, MD  Admit date: 11/24/2015 Discharge date: 11/29/2015  Recommendations for Outpatient Follow-up:  1. Pt will need to follow up with PCP in 1-2 weeks post discharge 2. Please obtain BMP to evaluate electrolytes and kidney function 3. Please also check CBC to evaluate Hg and Hct levels 4. Weight 237 lbs this AM 5. Monitor weights daily   Discharge Diagnoses:  Active Problems:   Paroxysmal atrial fibrillation (HCC)   Acute respiratory failure (HCC)   Acute cystitis without hematuria  Discharge Condition: Stable  Diet recommendation: Heart healthy diet discussed in details   Brief Narrative:  82 y.o. female with medical history of MI, CHF, HTN, anemia status post pacemaker/defibrilator placement, presenting after acute onset SOB and CP. Pt has been resident at [facility] for the last 20 days. PT explained that episode occurred last night when she got up to go to the restroom.   In ED, she required BiPAP, chest x-ray shows cardiomegaly with moderate diffuse pulmonary edema consistent with CHF exacerbation. Her weight on admission is 244 pounds. She was given 60 mg of IV Lasix in the emergency room. She has diuresed 1.2 L.  Her last echo in May 2017, EF 50-55%.   Assessment & Plan:  Acute respiratory failure with hypoxia secondary to Acute on chronic diastolic CHF - review of records indicate oxygen saturation in ED as low as 84% on RA and with RR > 24 and up to 30's, requiring placement on BiPAP - pt has been successfully tapered off BiPAP in < 24 hours and was successfully diuresed  - reports feeling better this am and would like to be discharged  - appreciate cardiology team following - continue with lasix per cardiology team, dose increased to 40 mg BID PO and this will be continued upon discharge  - monitor daily weights - she had left heart cath May 2012 that  showed no CAD. EF 25-30%. EF has since improved to 50-55% per Echo last month.  - weight down from 248 --> 238 --> 237 lbs this AM  PAF - coumadin has been on hold due to recent bleeding post fall episode  - no plan to restart Eliquis at this time per cardiology   Hypokalemia  - from Lasix - supplemented   UTI - completed rocephin, 4 days therapy OK for uncomplicated cystitis   HLD - On high dose statin, continue same  Essential HTN - reasonable inpatient control   Morbid obesity due to excess calories  - Body mass index is 39.4 kg/(m^2)  DVT prophylaxis: Lovenox Sq Code Status: Full  Family Communication: Patient and son at bedside  Disposition Plan: SNF  Consultants:   Cardiology  Procedures:   None  Antimicrobials:   Rocephin 6/10 --> completed while inpatient   Discharge Exam: Filed Vitals:   11/29/15 0205 11/29/15 0528  BP: 121/72 118/70  Pulse: 99 98  Temp:  98 F (36.7 C)  Resp:     Filed Vitals:   11/28/15 2304 11/29/15 0205 11/29/15 0210 11/29/15 0528  BP:  121/72  118/70  Pulse: 96 99  98  Temp:    98 F (36.7 C)  TempSrc:    Oral  Resp: 18     Height:      Weight:    107.639 kg (237 lb 4.8 oz)  SpO2: 93% 92% 93% 95%    General: Pt is alert, follows commands appropriately,  not in acute distress Cardiovascular: Regular rate and rhythm, S1/S2 +,  no rubs, no gallops Respiratory: Clear to auscultation bilaterally, no wheezing, diminished breath sounds at bases  Abdominal: Soft, non tender, non distended, bowel sounds +, no guarding Extremities: +1 bilateral LE edema, no cyanosis, chronic venous stasis changes  Neuro: Grossly nonfocal  Discharge Instructions     Medication List    TAKE these medications        acetaminophen 325 MG tablet  Commonly known as:  TYLENOL  Take 650 mg by mouth 2 (two) times daily.     allopurinol 100 MG tablet  Commonly known as:  ZYLOPRIM  Take 100 mg by mouth daily. Takes along with a 300 mg  tablet to equal 400 mg     CALCIUM 600+D 600-400 MG-UNIT tablet  Generic drug:  Calcium Carbonate-Vitamin D  Take 1 tablet by mouth 2 (two) times daily.     colestipol 1 g tablet  Commonly known as:  COLESTID  Take 2 g by mouth daily.     furosemide 40 MG tablet  Commonly known as:  LASIX  Take 1 tablet (40 mg total) by mouth 2 (two) times daily.     gabapentin 600 MG tablet  Commonly known as:  NEURONTIN  Take 1 tablet (600 mg total) by mouth 2 (two) times daily.     guaiFENesin 600 MG 12 hr tablet  Commonly known as:  MUCINEX  Take 1 tablet (600 mg total) by mouth 2 (two) times daily.     multivitamin tablet  Take 1 tablet by mouth daily.     nitroGLYCERIN 0.4 MG SL tablet  Commonly known as:  NITROSTAT  Place 1 tablet (0.4 mg total) under the tongue every 5 (five) minutes as needed for chest pain.     nortriptyline 25 MG capsule  Commonly known as:  PAMELOR  Take 25 mg by mouth at bedtime.     oxyCODONE-acetaminophen 5-325 MG tablet  Commonly known as:  ROXICET  Take 1-2 tablets by mouth every 4 (four) hours as needed (Pain).     OXYGEN  Inhale 2 L/min into the lungs as needed.     potassium chloride 10 MEQ tablet  Commonly known as:  K-DUR  Take 1 tablet (10 mEq total) by mouth 2 (two) times daily.     pravastatin 80 MG tablet  Commonly known as:  PRAVACHOL  Take 1 tablet (80 mg total) by mouth every evening.     PROCEL Powd  Take 1 scoop by mouth 2 (two) times daily.     Vitamin D3 1000 units Caps  Take 1 capsule by mouth daily.         Follow-up Information    Follow up with [facility].   Specialty:  Skilled Nursing Facility   Contact information:   [place] [phone]      Follow up with Jonathon Bellows, MD.   Specialty:  Family Medicine   Contact information:   [place] [address] [place] [address] [phone]       Call Faye Ramsay, MD.   Specialty:  Internal  Medicine   Why:  As needed call my cell phone [phone]   Contact information:   [place] [address] [phone]       Follow up with Lyda Jester, PA-C On 12/14/2015.   Specialties:  Cardiology, Radiology   Why:  11am for your  hospital follow up   Contact information:   [address] [place] [address] [phone]         The results of significant diagnostics from this hospitalization (including imaging, microbiology, ancillary and laboratory) are listed below for reference.     Microbiology: Recent Results (from the past 240 hour(s))  Urine culture     Status: Abnormal   Collection Time: 11/24/15  9:25 AM  Result Value Ref Range Status   Specimen Description URINE, CLEAN CATCH  Final   Special Requests NONE  Final   Culture MULTIPLE SPECIES PRESENT, SUGGEST RECOLLECTION (A)  Final   Report Status 11/25/2015 FINAL  Final  MRSA PCR Screening     Status: None   Collection Time: 11/24/15  9:35 PM  Result Value Ref Range Status   MRSA by PCR NEGATIVE NEGATIVE Final  Urine culture     Status: None   Collection Time: 11/25/15  8:13 AM  Result Value Ref Range Status   Specimen Description URINE, RANDOM  Final   Special Requests NONE  Final   Culture NO GROWTH  Final   Report Status 11/26/2015 FINAL  Final     Labs: Basic Metabolic Panel:  Recent Labs Lab 11/25/15 0313 11/26/15 0309 11/27/15 0325 11/28/15 0240 11/29/15 0242  NA 140 139 139 141 138  K 2.9* 3.1* 3.4* 3.5 3.5  CL 99* 97* 99* 98* 99*  CO2 31 31 30  33* 27  GLUCOSE 111* 122* 123* 119* 114*  BUN 14 18 14 16 18   CREATININE 0.95 0.93 0.90 0.97 0.85  CALCIUM 8.9 9.3 9.2 9.1 9.4   Liver Function Tests:  Recent Labs Lab 11/25/15 0313  AST 28  ALT 16  ALKPHOS 53  BILITOT 1.0  PROT 6.5  ALBUMIN 3.7   CBC:  Recent Labs Lab 11/24/15 0657 11/25/15 0313 11/26/15 0309 11/27/15 0325 11/28/15 0240  WBC 11.4* 7.1 8.1 6.6 6.4  NEUTROABS  9.0*  --   --   --   --   HGB 11.2* 10.9* 11.3* 11.2* 11.5*  HCT 37.2 36.5 37.9 37.2 37.9  MCV 96.4 96.8 96.9 96.6 97.7  PLT 213 205 237 225 239   Cardiac Enzymes:  Recent Labs Lab 11/24/15 0840 11/24/15 1136 11/24/15 1416  TROPONINI 0.14* 0.13* 0.14*   BNP: BNP (last 3 results)  Recent Labs  11/02/15 0736 11/24/15 0249  BNP 143.9* 801.6*    CBG:  Recent Labs Lab 11/25/15 1644 11/25/15 2029 11/26/15 0025 11/26/15 0435 11/29/15 0752  GLUCAP 131* 163* 117* 131* 108*   SIGNED: Time coordinating discharge: 30 minutes  MAGICK-Leisha Trinkle, MD  [facility] 11/29/2015, 11:26 AM Pager [phone]  If 7PM-7AM, please contact night-coverage [email or web address] Password [email or web address]

## 2015-11-29 NOTE — Clinical Social Work Note (Signed)
CSW facilitated patient discharge including contacting patient family and facility to confirm patient discharge plans. Clinical information faxed to facility and family agreeable with plan. Patient's son will transport her to [facility]. RN to call report prior to discharge [phone]).  CSW will sign off for now as social work intervention is no longer needed. Please consult [place] again if new needs arise.  Dayton Scrape, [place]

## 2015-11-29 NOTE — Discharge Instructions (Signed)

## 2015-11-29 NOTE — Progress Notes (Signed)
OT Cancellation Note  Patient Details Name: ANJELIKA BEAS MRN: [id number] DOB: Jan 01, 1933   Cancelled Treatment:    Reason Eval/Treat Not Completed: Other (comment) (plans for d/c to SNF today). If plans change, will plan to see patient tomorrow for OT treat. Thank you.   Chrys Racer , MS, OTR/L, CLT Pager: [phone]  11/29/2015, 3:10 PM

## 2015-11-29 NOTE — Clinical Social Work Note (Signed)
Insurance authorization number obtained: [id number].  Facility aware that patient discharging today. They are waiting on a patient to leave the facility and to set up her room. Patient will have private room. Patient and family aware of discharge today. Patient's son will transport.  Dayton Scrape, [place]

## 2015-11-29 NOTE — Progress Notes (Signed)
Patient ID: Bianca [name], female   DOB: [birth year], 82 y.o.   MRN: [id number]   Patient Name: Bianca [name] Date of Encounter: 11/29/2015  Hospital Problem List     Active Problems:   Paroxysmal atrial fibrillation (HCC)   CHF exacerbation (HCC)   Acute diastolic (congestive) heart failure (HCC)   Acute respiratory failure (HCC)   Physical deconditioning   Hyperglycemia   Peripheral neuropathy (HCC)   HLD (hyperlipidemia)   Depression   Chest pain   Acute on chronic congestive heart failure ([place])   Acute cystitis without hematuria    Subjective   Feeling well this morning. No dyspnea with rest, and improving with exertion.   Inpatient Medications    . allopurinol  400 mg Oral Daily  . cefTRIAXone (ROCEPHIN)  IV  1 g Intravenous Q24H  . colestipol  2 g Oral Daily  . enoxaparin (LOVENOX) injection  40 mg Subcutaneous Q24H  . furosemide  40 mg Oral BID  . gabapentin  800 mg Oral TID  . guaiFENesin  600 mg Oral BID  . nortriptyline  25 mg Oral QHS  . potassium chloride  10 mEq Oral BID  . pravastatin  80 mg Oral QPM  . sodium chloride flush  3 mL Intravenous Q12H    Vital Signs    Filed Vitals:   11/28/15 2304 11/29/15 0205 11/29/15 0210 11/29/15 0528  BP:  121/72  118/70  Pulse: 96 99  98  Temp:    98 F (36.7 C)  TempSrc:    Oral  Resp: 18     Height:      Weight:    237 lb 4.8 oz (107.639 kg)  SpO2: 93% 92% 93% 95%    Intake/Output Summary (Last 24 hours) at 11/29/15 0820 Last data filed at 11/29/15 [id number]  Gross per 24 hour  Intake   1290 ml  Output   1350 ml  Net    -60 ml   Filed Weights   11/27/15 0509 11/28/15 0602 11/29/15 0528  Weight: 237 lb 12.8 oz (107.865 kg) 235 lb 11.2 oz (106.913 kg) 237 lb 4.8 oz (107.639 kg)    Physical Exam    General: Pleasant obese female, NAD. Neuro: Alert and oriented X 3. Moves all extremities spontaneously. Psych: Normal affect. HEENT:  Normal  Neck: Supple without bruits or JVD. Lungs:  Resp regular  and unlabored, Mild crackles bilateral lower lobes. Heart: RRR no s3, s4, or murmurs. Abdomen: Soft, non-tender, non-distended, BS + x 4. Large distended, healing hematoma to right flank.  Extremities: No clubbing, cyanosis 2+ edema improves. With stasis changes  DP/PT/Radials 2+ and equal bilaterally.  Labs    CBC  Recent Labs  11/27/15 0325 11/28/15 0240  WBC 6.6 6.4  HGB 11.2* 11.5*  HCT 37.2 37.9  MCV 96.6 97.7  PLT 225 [id number]   Basic Metabolic Panel  Recent Labs  11/28/15 0240 11/29/15 0242  NA 141 138  K 3.5 3.5  CL 98* 99*  CO2 33* 27  GLUCOSE 119* 114*  BUN 16 18  CREATININE 0.97 0.85  CALCIUM 9.1 9.4    Telemetry    V paced- Rate 90s some episodes of tachycardia  ECG    V-paced Rate-96 11/29/2015   Radiology     Assessment & Plan    Ms. Spurbeck is a 82 year old female with a past medical history of HTN,PAF, chronic systolic CHF, NICM s/p BiV ICD.  She presented to the emergency room  on 11/24/2015 with complaints of sudden onset chest pain and ongoing shortness of breath. Her BNP is elevated and chest x-ray shows pulmonary edema. She required short-term BiPAP currently on supplemental O2 via nasal cannula. She is received one dose of IV Lasix in the ED and diuresed 1.2 L.  1. Acute on chronic diastolic CHF:  --Cr trend improved from 1.03>>0.95>>0.85. Weight 248>>235Lbs , neg 1.3 L since yesterday, weight at baseline -- planned discharge today? with increased lasix compared to the admission dose Lasix 40 mg po daily --> 40 mg PO BID --Troponin is mildly elevated at 0.13>>0.13>>0.14 in the setting of CHF exacerbation. --She had a left heart cath in May of 2012 that showed no CAD. EF at that time was 25-30%. EF has since improved to 50-55% per Echo last month.   2. PAF: Patient was on Coumadin before she had her fall that resulted in severe right flank hematoma in May of 2017. She was hospitalized and required FFP to reverse her INR and PRBC's for blood  loss anemia. Continue to hold Coumadin. Stated she would like to consider Eliquis in the future. Renal function stable. Would not restart at this time.   3. HLD: On high intensity statin, continue same.  4. HTN: Given her diastolic dysfunction, and elevated HR Lopressor added  To reevaluate 5. Fever: Resolved on iv rocephin now for UTI with improvement   Signed,  Reino Bellis, NP-C 11/29/2015  The patient was seen, examined and discussed with Reino Bellis, NP-C and I agree with the above.   The patient appears to be euvolemic, with weight 237 pounds recent baseline, her creatinine is normal, we will sent home on increased Lasix compared to her admission, previously Lasix 40 daily we will discharge and 40 by mouth twice a day. We will plan for follow-up next Monday June 18 reevaluate her volume status. She is ready to be discharged today.  [name] 11/29/2015

## 2015-11-29 NOTE — Clinical Social Work Placement (Signed)
   CLINICAL SOCIAL WORK PLACEMENT  NOTE  Date:  11/29/2015  Patient Details  Name: Bianca [name] MRN: [id number] Date of Birth: Feb 13, 1933  Clinical Social Work is seeking post-discharge placement for this patient at the [place] level of care (*CSW will initial, date and re-position this form in  chart as items are completed):  Yes   Patient/family provided with [place] Work Department's list of facilities offering this level of care within the geographic area requested by the patient (or if unable, by the patient's family).  Yes   Patient/family informed of their freedom to choose among providers that offer the needed level of care, that participate in Medicare, Medicaid or managed care program needed by the patient, have an available bed and are willing to accept the patient.  Yes   Patient/family informed of [facility]'s ownership interest in [facility] and [facility], as well as of the fact that they are under no obligation to receive care at these facilities.  PASRR submitted to EDS on       PASRR number received on       Existing PASRR number confirmed on 11/26/15     FL2 transmitted to all facilities in geographic area requested by pt/family on 11/26/15     FL2 transmitted to all facilities within larger geographic area on       Patient informed that his/her managed care company has contracts with or will negotiate with certain facilities, including the following:        Yes   Patient/family informed of bed offers received.  Patient chooses bed at [facility]     Physician recommends and patient chooses bed at      Patient to be transferred to [facility] on 11/29/15.  Patient to be transferred to facility by Her son, [name]     Patient family notified on 11/29/15 of transfer.  Name of family member notified:  Randy     PHYSICIAN Please prepare prescriptions     Additional Comment:     _______________________________________________ Candie Chroman, LCSW 11/29/2015, 11:14 AM

## 2015-11-30 ENCOUNTER — Encounter: Payer: Self-pay | Admitting: Internal Medicine

## 2015-11-30 ENCOUNTER — Non-Acute Institutional Stay (SKILLED_NURSING_FACILITY): Payer: PPO | Admitting: Internal Medicine

## 2015-11-30 DIAGNOSIS — R531 Weakness: Secondary | ICD-10-CM

## 2015-11-30 DIAGNOSIS — F329 Major depressive disorder, single episode, unspecified: Secondary | ICD-10-CM | POA: Diagnosis not present

## 2015-11-30 DIAGNOSIS — N390 Urinary tract infection, site not specified: Secondary | ICD-10-CM | POA: Diagnosis not present

## 2015-11-30 DIAGNOSIS — I5042 Chronic combined systolic (congestive) and diastolic (congestive) heart failure: Secondary | ICD-10-CM

## 2015-11-30 DIAGNOSIS — E46 Unspecified protein-calorie malnutrition: Secondary | ICD-10-CM | POA: Diagnosis not present

## 2015-11-30 DIAGNOSIS — I4891 Unspecified atrial fibrillation: Secondary | ICD-10-CM

## 2015-11-30 DIAGNOSIS — G629 Polyneuropathy, unspecified: Secondary | ICD-10-CM

## 2015-11-30 DIAGNOSIS — M170 Bilateral primary osteoarthritis of knee: Secondary | ICD-10-CM

## 2015-11-30 DIAGNOSIS — E876 Hypokalemia: Secondary | ICD-10-CM | POA: Diagnosis not present

## 2015-11-30 DIAGNOSIS — E785 Hyperlipidemia, unspecified: Secondary | ICD-10-CM | POA: Diagnosis not present

## 2015-11-30 DIAGNOSIS — S301XXS Contusion of abdominal wall, sequela: Secondary | ICD-10-CM | POA: Diagnosis not present

## 2015-11-30 NOTE — Progress Notes (Signed)
LOCATION:  [facility]  PCP: Jonathon Bellows, MD   Code Status: Full Code  Goals of care: Advanced Directive information Advanced Directives 11/25/2015  Does patient have an advance directive? Yes  Type of Advance Directive [place]  Does patient want to make changes to advanced directive? No - Patient declined  Copy of advanced directive(s) in chart? No - copy requested       Extended Emergency Contact Information Primary Emergency Contact: [facility] Address: [address]          [place], [place] [address] [place] Phone: [phone] Mobile Phone: [phone] Relation: Son Secondary Emergency Contact: Pincus Large Address: [address] [place], [place] [address] Johnnette Litter of [place] Phone: [phone] Mobile Phone: [phone] Relation: Relative   Allergies  Allergen Reactions  . Quinapril Hcl Swelling    Tongue and throat  . Tape     Plastic tape causes irritation.   . Neosporin [Neomycin-Polymyxin-Gramicidin] Rash    Chief Complaint  Patient presents with  . Readmit To SNF    Readmission     HPI:  Patient is a 82 y.o. female seen today for short term rehabilitation post hospital re-admission from 11/24/15-11/29/15 with dyspnea and chest pain. She had chf exacerbation and acute respiratory failure. She required biPAP and her CXR showed pulmonary edema. She received iv diuresis. She was treated for UTI. She has history of afib, chf, HLD, HTN, obesity among others. She is seen in her room today with her daughter in law present.   Review of Systems:  Constitutional: Negative for fever, chills, diaphoresis. Feels weak and tired.  HENT: Negative for congestion,hearing loss, sore throat, difficulty swallowing. Positive for occasional headaches and nasal discharge.  Eyes: Negative for blurred vision, double vision and discharge.  Respiratory: Negative for cough, shortness of breath and  wheezing. On oxygen.  Cardiovascular: Negative for chest pain, palpitations, leg swelling.  Gastrointestinal: Negative for heartburn, nausea, vomiting, abdominal pain. Last bowel movement was yesterday. Genitourinary: Negative for dysuria and flank pain. has increased urinary frequency Musculoskeletal: Negative for back pain, fall in the facility.  Skin: Negative for itching, rash.  Neurological: Negative for dizziness. Psychiatric/Behavioral: Negative for depression    Past Medical History  Diagnosis Date  . Pacemaker   . ICD (implantable cardiac defibrillator) in place   . Myocardial infarction ([place]) 2008  . Sleep apnea   . Arthritis   . Neuromuscular disorder ([place])   . CHF (congestive heart failure) ([place])   . Hypertension   . Dysrhythmia   . Chronic systolic heart failure ([place])   . Acute systolic heart failure ([place])   . Acute blood loss anemia   . Depression 11/24/2015   Past Surgical History  Procedure Laterality Date  . Cholecystectomy    . Abdominal hysterectomy    . Ankle surgery    . Insert / replace / remove pacemaker  2008  . Knee ligament reconstruction  2012  . Tonsillectomy    . Cardiac catheterization    . Eye surgery     Social History:   reports that she has quit smoking. She has quit using smokeless tobacco. She reports that she does not drink alcohol or use illicit drugs.  Family History  Problem Relation Age of Onset  . Tuberculosis Father   . Stroke Mother   . Congestive Heart Failure Mother   . Diabetes Brother   . Stroke  Brother   . Cancer Brother   . Heart attack Son     Medications:   Medication List       This list is accurate as of: 11/30/15  1:52 PM.  Always use your most recent med list.               acetaminophen 325 MG tablet  Commonly known as:  TYLENOL  Take 650 mg by mouth 2 (two) times daily.     allopurinol 100 MG tablet  Commonly known as:  ZYLOPRIM  Take 100 mg by mouth daily. Takes along with a 300 mg tablet to  equal 400 mg     atorvastatin 10 MG tablet  Commonly known as:  LIPITOR  Take 10 mg by mouth daily.     CALCIUM 600+D 600-400 MG-UNIT tablet  Generic drug:  Calcium Carbonate-Vitamin D  Take 1 tablet by mouth 2 (two) times daily.     colestipol 1 g tablet  Commonly known as:  COLESTID  Take 2 g by mouth daily.     furosemide 40 MG tablet  Commonly known as:  LASIX  Take 1 tablet (40 mg total) by mouth 2 (two) times daily.     gabapentin 800 MG tablet  Commonly known as:  NEURONTIN  Take 800 mg by mouth 3 (three) times daily.     guaiFENesin 600 MG 12 hr tablet  Commonly known as:  MUCINEX  Take 1 tablet (600 mg total) by mouth 2 (two) times daily.     multivitamin tablet  Take 1 tablet by mouth daily.     nitroGLYCERIN 0.4 MG SL tablet  Commonly known as:  NITROSTAT  Place 1 tablet (0.4 mg total) under the tongue every 5 (five) minutes as needed for chest pain.     nortriptyline 25 MG capsule  Commonly known as:  PAMELOR  Take 25 mg by mouth at bedtime.     oxyCODONE-acetaminophen 5-325 MG tablet  Commonly known as:  ROXICET  Take 1-2 tablets by mouth every 4 (four) hours as needed (Pain).     OXYGEN  Inhale 2 L/min into the lungs as needed.     potassium chloride SA 20 MEQ tablet  Commonly known as:  K-DUR,KLOR-CON  Take 20 mEq by mouth 2 (two) times daily.     PROCEL Powd  Take 1 scoop by mouth 2 (two) times daily.     Vitamin D3 1000 units Caps  Take 1 capsule by mouth daily.        Immunizations: Immunization History  Administered Date(s) Administered  . Influenza,inj,Quad PF,36+ Mos 04/01/2014  . PPD Test 11/17/2015     Physical Exam:  Filed Vitals:   11/30/15 1347  BP: 127/78  Pulse: 100  Temp: 98 F (36.7 C)  TempSrc: Oral  Resp: 20  Height: 5\' 6"  (1.676 m)  Weight: 248 lb 12.8 oz (112.855 kg)   Body mass index is 40.18 kg/(m^2).  General- elderly female, obese, in no acute distress Head- normocephalic, atraumatic Nose- no  maxillary or frontal sinus tenderness, no nasal discharge Throat- moist mucus membrane  Eyes- PERRLA, EOMI, no pallor, no icterus, no discharge, normal conjunctiva, normal sclera Neck- no cervical lymphadenopathy, no supraclavicular lymphadenopathy, no thyromegaly, no jugular vein distension Cardiovascular- normal s1,s2, no murmur, trace leg edema Respiratory- bilateral clear to auscultation, no wheeze, no rhonchi, no crackles, no use of accessory muscles Abdomen- bowel sounds present, soft, non tender Musculoskeletal- able to move all 4 extremities, generalized weakness  Neurological- alert and oriented to person, place and time Skin- warm and dry, right flank hematoma + with mild tenderness Psychiatry- normal mood and affect     Labs reviewed: Basic Metabolic Panel:  Recent Labs  11/27/15 0325 11/28/15 0240 11/29/15 0242  NA 139 141 138  K 3.4* 3.5 3.5  CL 99* 98* 99*  CO2 30 33* 27  GLUCOSE 123* 119* 114*  BUN 14 16 18   CREATININE 0.90 0.97 0.85  CALCIUM 9.2 9.1 9.4   Liver Function Tests:  Recent Labs  10/25/15 0050 10/26/15 0655 11/25/15 0313  AST 20 19 28   ALT 13* 13* 16  ALKPHOS 42 46 53  BILITOT 0.6 0.9 1.0  PROT 6.2* 5.7* 6.5  ALBUMIN 3.5 3.2* 3.7   No results for input(s): LIPASE, AMYLASE in the last 8760 hours. No results for input(s): AMMONIA in the last 8760 hours. CBC:  Recent Labs  10/26/15 1626 10/27/15 0325  11/24/15 0657  11/26/15 0309 11/27/15 0325 11/28/15 0240  WBC 11.7* 11.5*  < > 11.4*  < > 8.1 6.6 6.4  NEUTROABS 7.9* 7.1  --  9.0*  --   --   --   --   HGB 7.5* 7.3*  < > 11.2*  < > 11.3* 11.2* 11.5*  HCT 22.6* 22.8*  < > 37.2  < > 37.9 37.2 37.9  MCV 89.0 89.4  < > 96.4  < > 96.9 96.6 97.7  PLT 145* 166  < > 213  < > 237 225 239  < > = values in this interval not displayed. Cardiac Enzymes:  Recent Labs  11/24/15 0840 11/24/15 1136 11/24/15 1416  TROPONINI 0.14* 0.13* 0.14*   BNP: Invalid input(s): POCBNP CBG:  Recent  Labs  11/26/15 0435 11/29/15 0752 11/29/15 1217  GLUCAP 131* 108* 113*    Radiological Exams: Dg Chest Port 1 View  11/24/2015  CLINICAL DATA:  Initial evaluation for acute chest pain and shortness of breath. EXAM: PORTABLE CHEST 1 VIEW COMPARISON:  Prior radiograph from 11/01/2015. FINDINGS: Left-sided transvenous pacemaker/ AICD in place, stable. Cardiomegaly unchanged. Mediastinal silhouette within normal limits. Lungs are hypoinflated. Moderate diffuse pulmonary edema present. Evaluation limited for pleural effusion. No definite focal infiltrates. No pneumothorax. No acute osseous abnormality. IMPRESSION: Cardiomegaly with moderate diffuse pulmonary edema, consistent with acute CHF exacerbation. Electronically Signed   By: Jeannine Boga M.D.   On: 11/24/2015 02:42   Dg Chest Port 1 View  11/01/2015  CLINICAL DATA:  Dyspnea this morning.  No chest pain. EXAM: PORTABLE CHEST 1 VIEW COMPARISON:  10/24/2015 FINDINGS: Cardiac silhouette is mildly enlarged. No mediastinal or hilar masses or evidence of adenopathy. Left anterior chest wall biventricular cardioverter-defibrillator is stable. Lungs are clear.  No pleural effusion or pneumothorax. IMPRESSION: No acute cardiopulmonary disease. Stable appearance from the recent prior exam. Electronically Signed   By: Lajean Manes M.D.   On: 11/01/2015 11:03    Assessment/Plan  Generalized weakness Will have her  work with physical therapy and occupational therapy team to help with gait training and muscle strengthening exercises.fall precautions. Skin care. Encourage to be out of bed.   Chronic chf Breathing stable, on prn o2, continue to check daily weight. Currently on lasix 40 mg bid, change this to lasix 80 mg daily and monitor. Check bmp.   Hypokalemia With her on lasix, continue kcl 20 meq bid and monitor  Protein calorie malnutrition Continue procel supplement  afib Rate controlled. Off rate controlling agent for  now  Neuropathy  Continue gabapentin 800 mg tid  Right flank hematoma Monitor clinically  Knee OA Continue tylenol 650 mg bid with percocet 5-325 mg 1-2 q4h prn pain  UTI Currently asymptomatic, monitor for symptom, has completed her antibiotics  Depression Stable mood, continue nortriptyline  HLD Continue lipitor and colestid for now  Goals of care: short term rehabilitation   Labs/tests ordered: cbc, cmp 12/05/15  Family/ staff Communication: reviewed care plan with patient, her daughter in law and nursing supervisor    Blanchie Serve, MD Internal Medicine [address], [place] [address] Cell Phone (Monday-Friday 8 am - 5 pm): [phone] On Call: [phone] and follow prompts after 5 pm and on weekends Office Phone: [phone] Office Fax: [phone]

## 2015-12-01 ENCOUNTER — Other Ambulatory Visit: Payer: Self-pay | Admitting: *Deleted

## 2015-12-01 ENCOUNTER — Encounter (HOSPITAL_COMMUNITY): Payer: Self-pay | Admitting: Cardiology

## 2015-12-01 MED ORDER — OXYCODONE-ACETAMINOPHEN 5-325 MG PO TABS: ORAL_TABLET | ORAL | Status: DC

## 2015-12-01 NOTE — Telephone Encounter (Signed)
[facility] 

## 2015-12-02 ENCOUNTER — Telehealth: Payer: Self-pay | Admitting: Cardiology

## 2015-12-02 NOTE — Telephone Encounter (Signed)
F/u Message   [name] calling; she states she has not spoke with the RN yet and is concerned because she stated pt can not come to appt Monday 19 alone. Ms Bianca [name] says no one is avail to bring pt to appt.pt daughter states pt needs to be seen sooner than avail appt.  Please call back to discuss

## 2015-12-02 NOTE — Telephone Encounter (Signed)
Correction appt 6/28

## 2015-12-02 NOTE — Telephone Encounter (Signed)
Left message for pt on private voice mail to c/b to discuss concerns.  There is not a phone # listed in this call and I have not been able to locate a DPR to be able to discuss pt information.

## 2015-12-02 NOTE — Telephone Encounter (Signed)
NEw Message    [name] call requesting to speak with RN. She states pt was hospitalized over the weekend. Pt was seen by Dr. Marlou Porch in hospital. D-n-L stated pt also seen Dr. Johnsie Cancel and Dr. Meda Coffee. She states she was told by Dr. Meda Coffee pt would be able to get an appt on Monday. She states  Pt was schedule an appt that she didn't know about. Please call back to discuss

## 2015-12-02 NOTE — Telephone Encounter (Signed)
Spoke with daughter in law who is very upset that an appt date and time wasn't given to the pt prior her d/c from the hospital.  Advised there is an appointment time listed on pt's d/c instructions.  Daughter in law states Dr Meda Coffee instructed them the pt needs to be seen on Monday for close f/u of CHF.  Dr Nelson's progress note does state the pt will be seen then.   Advised I will find an appt and call her back.  Was able to obtain an appt for pt f/u on Monday June 19th with Dr Sherren Mocha at 2:15.  Daughter in law is aware.

## 2015-12-05 ENCOUNTER — Ambulatory Visit (INDEPENDENT_AMBULATORY_CARE_PROVIDER_SITE_OTHER): Payer: PPO | Admitting: Cardiovascular Disease

## 2015-12-05 ENCOUNTER — Encounter: Payer: Self-pay | Admitting: Cardiovascular Disease

## 2015-12-05 VITALS — BP 120/80 | HR 86 | Ht 66.0 in | Wt 239.6 lb

## 2015-12-05 DIAGNOSIS — Z79899 Other long term (current) drug therapy: Secondary | ICD-10-CM | POA: Diagnosis not present

## 2015-12-05 DIAGNOSIS — I5022 Chronic systolic (congestive) heart failure: Secondary | ICD-10-CM | POA: Diagnosis not present

## 2015-12-05 LAB — BASIC METABOLIC PANEL
BUN: 16 mg/dL (ref 4–21)
CREATININE: 1 mg/dL (ref 0.5–1.1)
GLUCOSE: 120 mg/dL
POTASSIUM: 3.6 mmol/L (ref 3.4–5.3)
SODIUM: 143 mmol/L (ref 137–147)

## 2015-12-05 LAB — CBC AND DIFFERENTIAL
HCT: 39 % (ref 36–46)
Hemoglobin: 12.1 g/dL (ref 12.0–16.0)
Platelets: 272 10*3/uL (ref 150–399)
WBC: 6.8 10^3/mL

## 2015-12-05 LAB — HEPATIC FUNCTION PANEL
ALK PHOS: 57 U/L (ref 25–125)
ALT: 14 U/L (ref 7–35)
AST: 23 U/L (ref 13–35)
Bilirubin, Total: 0.8 mg/dL

## 2015-12-05 NOTE — Patient Instructions (Addendum)
Medication Instructions:  Your physician recommends that you continue on your current medications as directed. Please refer to the Current Medication list given to you today.  Labwork: Your physician recommends that you return for lab work in: 4-6 WEEKS (BMP)  Testing/Procedures: No new orders.   Follow-Up: Your physician recommends that you keep your scheduled follow-up appointment with Dr Haroldine Laws and Dr Marlou Porch.  Any Other Special Instructions Will Be Listed Below (If Applicable).     If you need a refill on your cardiac medications before your next appointment, please call your pharmacy.

## 2015-12-05 NOTE — Progress Notes (Signed)
Cardiology Office Note Date:  12/05/2015   ID:  Bianca [name], DOB 1933/04/01, MRN [id number]  PCP:  Jonathon Bellows, MD  Cardiologist:  Sherren Mocha, MD    Chief Complaint  Patient presents with  . Chronic Systolic Heart Failure   History of Present Illness: Bianca [name] is a 82 y.o. female who presents for Follow-up after hospital admission for acute on chronic diastolic heart failure last week. Comorbid conditions include atrial fibrillation, morbid obesity, history of severe LV dysfunction now with low normal LVEF, status post biventricular ICD implantation. The patient was admitted last month after a fall complicated by a right flank hematoma. She developed fluid overload secondary to blood product administration. The heart failure team followed her during that hospitalization. She's had a prolonged recovery from her flank hematoma, but notes some improvement over the last week. She was hospitalized again last week with worsening shortness of breath. She was again diuresed and her home furosemide dose was increased. She presents today for follow-up evaluation. She has been followed by Dr. Marlou Porch and also by Dr. Haroldine Laws in the heart failure clinic.  The patient is feeling much better. She changed her furosemide dosing to 80 mg once daily because of frequent urination at night waking her from sleep. She denies orthopnea, PND, or chest pain. Overall feels like she is doing fairly well. She is convales[facility] at [facility], but plans to return to a more independent retirement facility here in the next week. She is carefully watching her sodium intake. She plans to weigh herself daily when she is discharged from [facility].  Past Medical History  Diagnosis Date  . Pacemaker   . ICD (implantable cardiac defibrillator) in place   . Myocardial infarction ([place]) 2008  . Sleep apnea   . Arthritis   . Neuromuscular disorder ([place])   . CHF (congestive heart failure) ([place])   .  Hypertension   . Dysrhythmia   . Chronic systolic heart failure ([place])   . Acute systolic heart failure ([place])   . Acute blood loss anemia   . Depression 11/24/2015    Past Surgical History  Procedure Laterality Date  . Cholecystectomy    . Abdominal hysterectomy    . Ankle surgery    . Insert / replace / remove pacemaker  2008  . Knee ligament reconstruction  2012  . Tonsillectomy    . Cardiac catheterization    . Eye surgery      Current Outpatient Prescriptions  Medication Sig Dispense Refill  . acetaminophen (TYLENOL) 325 MG tablet Take 650 mg by mouth 2 (two) times daily.    Marland Kitchen allopurinol (ZYLOPRIM) 100 MG tablet Take 100 mg by mouth daily. Takes along with a 300 mg tablet to equal 400 mg    . allopurinol (ZYLOPRIM) 300 MG tablet Take 300 mg by mouth daily. Take with the 100mg  tablet to equal 400mg  daily    . atorvastatin (LIPITOR) 10 MG tablet Take 10 mg by mouth daily.    . Calcium Carbonate-Vitamin D (CALCIUM 600+D) 600-400 MG-UNIT per tablet Take 1 tablet by mouth 2 (two) times daily.    . Cholecalciferol (VITAMIN D3) 1000 UNITS CAPS Take 1 capsule by mouth daily.    . colestipol (COLESTID) 1 G tablet Take 2 g by mouth daily.     . furosemide (LASIX) 80 MG tablet Take 80 mg by mouth every morning.    . gabapentin (NEURONTIN) 800 MG tablet Take 800 mg by mouth 3 (three)  times daily.    Marland Kitchen guaiFENesin (MUCINEX) 600 MG 12 hr tablet Take 1 tablet (600 mg total) by mouth 2 (two) times daily.    . Multiple Vitamin (MULTIVITAMIN) tablet Take 1 tablet by mouth daily.    . nitroGLYCERIN (NITROSTAT) 0.4 MG SL tablet Place 1 tablet (0.4 mg total) under the tongue every 5 (five) minutes as needed for chest pain. 25 tablet 6  . nortriptyline (PAMELOR) 25 MG capsule Take 25 mg by mouth at bedtime.    Marland Kitchen oxyCODONE-acetaminophen (ROXICET) 5-325 MG tablet Take one to two tablets by mouth every 4 hours as needed for pain. Do not exceed 4gm of Tylenol in 24 hours 360 tablet 0  . OXYGEN Inhale 2  L/min into the lungs continuous as needed (shortness of breath).     . potassium chloride SA (K-DUR,KLOR-CON) 20 MEQ tablet Take 20 mEq by mouth 2 (two) times daily.    . Protein (PROCEL) POWD Take 1 scoop by mouth 2 (two) times daily.     No current facility-administered medications for this visit.    Allergies:   Quinapril hcl; Tape; and Neosporin   Social History:  The patient  reports that she has quit smoking. She has quit using smokeless tobacco. She reports that she does not drink alcohol or use illicit drugs.   Family History:  The patient's  family history includes Cancer in her brother; Congestive Heart Failure in her mother; Diabetes in her brother; Heart attack in her son; Stroke in her brother and mother; Tuberculosis in her father.    ROS:  Please see the history of present illness. All other systems are reviewed and negative.   PHYSICAL EXAM: VS:  BP 120/80 mmHg  Pulse 86  Ht 5\' 6"  (1.676 m)  Wt 239 lb 9.6 oz (108.682 kg)  BMI 38.69 kg/m2 , BMI Body mass index is 38.69 kg/(m^2). GEN: Well nourished, well developed, in no acute distress HEENT: normal Neck: mild JVP elevation, no masses. No carotid bruits Cardiac: irregular without murmur or gallop                Respiratory:  clear to auscultation bilaterally, normal work of breathing GI: soft, nontender, nondistended, + BS, there is a firm right flank hematoma, improving per patient MS: no deformity or atrophy Ext: no pretibial edema Skin: warm and dry, no rash Neuro:  Strength and sensation are intact Psych: euthymic mood, full affect  EKG:  EKG is not ordered today.  Recent Labs: 11/24/2015: B Natriuretic Peptide 801.6* 11/25/2015: ALT 16 11/28/2015: Hemoglobin 11.5*; Platelets 239 11/29/2015: BUN 18; Creatinine, Ser 0.85; Potassium 3.5; Sodium 138   Lipid Panel     Component Value Date/Time   CHOL 158 07/20/2014 1149   TRIG 332.0* 07/20/2014 1149   HDL 38.60* 07/20/2014 1149   CHOLHDL 4 07/20/2014 1149    VLDL 66.4* 07/20/2014 1149   LDLCALC 73 09/30/2013 0948   LDLDIRECT 80.0 07/20/2014 1149      Wt Readings from Last 3 Encounters:  12/05/15 239 lb 9.6 oz (108.682 kg)  11/30/15 248 lb 12.8 oz (112.855 kg)  11/29/15 237 lb 4.8 oz (107.639 kg)     Cardiac Studies Reviewed: Echo 10-26-2015: Study Conclusions  - Left ventricle: The cavity size was moderately dilated. There was  mild concentric hypertrophy. Systolic function was at the lower  limits of normal. The estimated ejection fraction was in the  range of 50% to 55%. Hypokinesis of the lateral and inferolateral  myocardium. Doppler parameters  are consistent with abnormal left  ventricular relaxation (grade 1 diastolic dysfunction). Doppler  parameters are consistent with high ventricular filling pressure. - Aortic valve: Transvalvular velocity was within the normal range.  There was no stenosis. There was no regurgitation. - Mitral valve: Calcified annulus. Transvalvular velocity was  within the normal range. There was no evidence for stenosis.  There was mild regurgitation. - Left atrium: The atrium was severely dilated. - Right ventricle: The cavity size was normal. Wall thickness was  normal. Systolic function was normal. - Inferior vena cava: The vessel was normal in size. The  respirophasic diameter changes were in the normal range (>= 50%),  consistent with normal central venous pressure.   ASSESSMENT AND PLAN: 1.  Chronic diastolic CHF: NYHA IIb at present: continue same Rx. Discussed the importance of avoiding salt and daily weights. Continue furosemide 80 mg daily. Follow-up metabolic panel next month. Keep scheduled follow-up appointments over the next 4-6 weeks.  2. Paroxysmal atrial fibrillation: Off of anticoagulation after recent fall and large flank hematoma. The patient is still healing from her flank hematoma and I think it is too soon to start her on a direct oral anticoagulant drug. Consider  Eliquis at follow-up.  3. Chronic kidney disease stage III: Close lab follow-up in the setting of CHF and chronic diuretic therapy.  4. Morbid obesity  Current medicines are reviewed with the patient today. The patient does not have concerns regarding medicines.  Labs/ tests ordered today include:  No orders of the defined types were placed in this encounter.   Disposition:   FU as scheduled (Dr Marlou Porch and Dr Haroldine Laws)  Signed, Sherren Mocha, MD  12/05/2015 4:49 PM    [address], [place], [place]  [address] Phone: [phone]; Fax: [phone]

## 2015-12-10 DIAGNOSIS — G4733 Obstructive sleep apnea (adult) (pediatric): Secondary | ICD-10-CM | POA: Diagnosis not present

## 2015-12-14 ENCOUNTER — Encounter: Payer: PPO | Admitting: Cardiology

## 2015-12-19 ENCOUNTER — Encounter: Payer: Self-pay | Admitting: Adult Health

## 2015-12-19 ENCOUNTER — Non-Acute Institutional Stay (SKILLED_NURSING_FACILITY): Payer: PPO | Admitting: Adult Health

## 2015-12-19 DIAGNOSIS — F329 Major depressive disorder, single episode, unspecified: Secondary | ICD-10-CM | POA: Diagnosis not present

## 2015-12-19 DIAGNOSIS — S3013XS Contusion of flank (latus) region, sequela: Secondary | ICD-10-CM

## 2015-12-19 DIAGNOSIS — E876 Hypokalemia: Secondary | ICD-10-CM | POA: Diagnosis not present

## 2015-12-19 DIAGNOSIS — M170 Bilateral primary osteoarthritis of knee: Secondary | ICD-10-CM

## 2015-12-19 DIAGNOSIS — R531 Weakness: Secondary | ICD-10-CM

## 2015-12-19 DIAGNOSIS — E785 Hyperlipidemia, unspecified: Secondary | ICD-10-CM | POA: Diagnosis not present

## 2015-12-19 DIAGNOSIS — M109 Gout, unspecified: Secondary | ICD-10-CM | POA: Diagnosis not present

## 2015-12-19 DIAGNOSIS — I5032 Chronic diastolic (congestive) heart failure: Secondary | ICD-10-CM

## 2015-12-19 DIAGNOSIS — I480 Paroxysmal atrial fibrillation: Secondary | ICD-10-CM

## 2015-12-19 DIAGNOSIS — S301XXS Contusion of abdominal wall, sequela: Secondary | ICD-10-CM

## 2015-12-19 DIAGNOSIS — G629 Polyneuropathy, unspecified: Secondary | ICD-10-CM | POA: Diagnosis not present

## 2015-12-19 NOTE — Progress Notes (Signed)
Patient ID: Bianca [name], female   DOB: 1932/11/30, 82 y.o.   MRN: [id number]    DATE:  12/19/2015   MRN:  [id number]  BIRTHDAY: [birth year]  Facility:  Nursing Home Location:  [facility] Room Number: 105-P  LEVEL OF CARE:  SNF [phone])  Contact Information    Name Relation Home Work [place] Son [phone]  [phone]   Bianca, [name] Relative [phone]  [phone]       Code Status History    Date Active Date Inactive Code Status Order ID Comments User Context   11/24/2015  8:34 AM 11/29/2015  7:08 PM Full Code [id number]  Waldemar Dickens, MD ED   11/24/2015  8:29 AM 11/24/2015  8:34 AM Full Code [id number]  Waldemar Dickens, MD ED   10/25/2015  6:57 AM 11/03/2015  6:29 PM Full Code [id number]  Stark Klein, MD Inpatient       Chief Complaint  Patient presents with  . Discharge Note    HISTORY OF PRESENT ILLNESS:  This is an 82 year old female who is for discharge home with Home health PT, OT, CNA and Nursing. Resident will discharge home with medications.  She was originally admitted to [facility] on 11/03/15 and then was transferred to hospital and re-admitted on 11/29/15 from [facility] with dyspnea and chest pain. She had CHF exacerbbation and acute respiratory failure. She required biPAP and her CXR showed pulmonary edema. She had IV diuresis. She was, also, treated for UTI.  Patient was admitted to this facility for short-term rehabilitation after the patient's recent hospitalization.  Patient has completed SNF rehabilitation and therapy has cleared the patient for discharge.  PAST MEDICAL HISTORY:  Past Medical History  Diagnosis Date  . Pacemaker   . ICD (implantable cardiac defibrillator) in place   . Myocardial infarction ([place]) 2008  . Sleep apnea   . Arthritis   . Neuromuscular disorder ([place])   . CHF (congestive heart failure) ([place])   . Hypertension   . Dysrhythmia   . Chronic systolic heart failure  ([place])   . Acute systolic heart failure ([place])   . Acute blood loss anemia   . Depression 11/24/2015  . Physical deconditioning   . Hypokalemia   . Chronic combined systolic and diastolic heart failure ([place])   . A-fib ([place])   . Right flank hematoma      CURRENT MEDICATIONS: Reviewed  Patient's Medications  New Prescriptions   No medications on file  Previous Medications   ACETAMINOPHEN (TYLENOL) 325 MG TABLET    Take 650 mg by mouth 2 (two) times daily.   ALLOPURINOL (ZYLOPRIM) 100 MG TABLET    Take 100 mg by mouth daily. Takes along with a 300 mg tablet to equal 400 mg   ALLOPURINOL (ZYLOPRIM) 300 MG TABLET    Take 300 mg by mouth daily. Take with the 100mg  tablet to equal 400mg  daily   ATORVASTATIN (LIPITOR) 10 MG TABLET    Take 10 mg by mouth daily.   CALCIUM CARBONATE-VITAMIN D (CALCIUM 600+D) 600-400 MG-UNIT PER TABLET    Take 1 tablet by mouth 2 (two) times daily.   CHOLECALCIFEROL (VITAMIN D3) 1000 UNITS CAPS    Take 1 capsule by mouth daily.   COLESTIPOL (COLESTID) 1 G TABLET    Take 2 g by mouth daily.    FUROSEMIDE (LASIX) 80 MG TABLET    Take 80 mg by mouth every morning.   GABAPENTIN (NEURONTIN) 800  MG TABLET    Take 800 mg by mouth 3 (three) times daily.   GUAIFENESIN (MUCINEX) 600 MG 12 HR TABLET    Take 1 tablet (600 mg total) by mouth 2 (two) times daily.   MULTIPLE VITAMIN (MULTIVITAMIN) TABLET    Take 1 tablet by mouth daily.   NITROGLYCERIN (NITROSTAT) 0.4 MG SL TABLET    Place 1 tablet (0.4 mg total) under the tongue every 5 (five) minutes as needed for chest pain.   NORTRIPTYLINE (PAMELOR) 25 MG CAPSULE    Take 25 mg by mouth at bedtime.   OXYCODONE-ACETAMINOPHEN (ROXICET) 5-325 MG TABLET    Take one to two tablets by mouth every 4 hours as needed for pain. Do not exceed 4gm of Tylenol in 24 hours   OXYGEN    Inhale 2 L/min into the lungs continuous as needed (shortness of breath).    POTASSIUM CHLORIDE SA (K-DUR,KLOR-CON) 20 MEQ TABLET    Take 20 mEq by mouth 2 (two)  times daily.   PROTEIN (PROCEL) POWD    Take 1 scoop by mouth 2 (two) times daily.  Modified Medications   No medications on file  Discontinued Medications   No medications on file     Allergies  Allergen Reactions  . Quinapril Hcl Swelling    Tongue and throat  . Tape     Plastic tape causes irritation.   . Neosporin [Neomycin-Polymyxin-Gramicidin] Rash     REVIEW OF SYSTEMS:  GENERAL: no change in appetite, no fatigue, no weight changes, no fever, chills or weakness EYES: Denies change in vision, dry eyes, eye pain, itching or discharge EARS: Denies change in hearing, ringing in ears, or earache NOSE: Denies nasal congestion or epistaxis MOUTH and THROAT: Denies oral discomfort, gingival pain or bleeding, pain from teeth or hoarseness   RESPIRATORY: no cough, SOB, DOE, wheezing, hemoptysis CARDIAC: no chest pain, or palpitations GI: no abdominal pain, diarrhea, constipation, heart burn, nausea or vomiting GU: Denies dysuria, frequency, hematuria, incontinence, or discharge PSYCHIATRIC: Denies feeling of depression or anxiety. No report of hallucinations, insomnia, paranoia, or agitation   PHYSICAL EXAMINATION  GENERAL APPEARANCE: Well nourished. In no acute distress. Obese SKIN:  Skin is warm and dry. Right flank hematoma HEAD: Normal in size and contour. No evidence of trauma EYES: Lids open and close normally. No blepharitis, entropion or ectropion. PERRL. Conjunctivae are clear and sclerae are white. Lenses are without opacity EARS: Pinnae are normal. Patient hears normal voice tunes of the examiner MOUTH and THROAT: Lips are without lesions. Oral mucosa is moist and without lesions. Tongue is normal in shape, size, and color and without lesions NECK: supple, trachea midline, no neck masses, no thyroid tenderness, no thyromegaly LYMPHATICS: no LAN in the neck, no supraclavicular LAN RESPIRATORY: breathing is even & unlabored, BS CTAB CARDIAC: RRR, no murmur,no extra  heart sounds, BLE edema 2+ GI: abdomen soft, normal BS, no masses, no tenderness, no hepatomegaly, no splenomegaly EXTREMITIES:  Able to move X 4 extremities PSYCHIATRIC: Alert and oriented X 3. Affect and behavior are appropriate  LABS/RADIOLOGY: Labs reviewed: Basic Metabolic Panel:  Recent Labs  11/27/15 0325 11/28/15 0240 11/29/15 0242 12/05/15  NA 139 141 138 143  K 3.4* 3.5 3.5 3.6  CL 99* 98* 99*  --   CO2 30 33* 27  --   GLUCOSE 123* 119* 114*  --   BUN 14 16 18 16   CREATININE 0.90 0.97 0.85 1.0  CALCIUM 9.2 9.1 9.4  --  Liver Function Tests:  Recent Labs  10/25/15 0050 10/26/15 0655 11/25/15 0313 12/05/15  AST 20 19 28 23   ALT 13* 13* 16 14  ALKPHOS 42 46 53 57  BILITOT 0.6 0.9 1.0  --   PROT 6.2* 5.7* 6.5  --   ALBUMIN 3.5 3.2* 3.7  --    CBC:  Recent Labs  10/26/15 1626 10/27/15 0325  11/24/15 0657  11/26/15 0309 11/27/15 0325 11/28/15 0240 12/05/15  WBC 11.7* 11.5*  < > 11.4*  < > 8.1 6.6 6.4 6.8  NEUTROABS 7.9* 7.1  --  9.0*  --   --   --   --   --   HGB 7.5* 7.3*  < > 11.2*  < > 11.3* 11.2* 11.5* 12.1  HCT 22.6* 22.8*  < > 37.2  < > 37.9 37.2 37.9 39  MCV 89.0 89.4  < > 96.4  < > 96.9 96.6 97.7  --   PLT 145* 166  < > 213  < > 237 225 239 272  < > = values in this interval not displayed.  Cardiac Enzymes:  Recent Labs  11/24/15 0840 11/24/15 1136 11/24/15 1416  TROPONINI 0.14* 0.13* 0.14*   BNP: Invalid input(s): POCBNP CBG:  Recent Labs  11/26/15 0435 11/29/15 0752 11/29/15 1217  GLUCAP 131* 108* 113*      Dg Chest Port 1 View  11/24/2015  CLINICAL DATA:  Initial evaluation for acute chest pain and shortness of breath. EXAM: PORTABLE CHEST 1 VIEW COMPARISON:  Prior radiograph from 11/01/2015. FINDINGS: Left-sided transvenous pacemaker/ AICD in place, stable. Cardiomegaly unchanged. Mediastinal silhouette within normal limits. Lungs are hypoinflated. Moderate diffuse pulmonary edema present. Evaluation limited for pleural  effusion. No definite focal infiltrates. No pneumothorax. No acute osseous abnormality. IMPRESSION: Cardiomegaly with moderate diffuse pulmonary edema, consistent with acute CHF exacerbation. Electronically Signed   By: Jeannine Boga M.D.   On: 11/24/2015 02:42    ASSESSMENT/PLAN:  Genneralized weakness - for Home health  PT, OT, CNA and Nursing  Chronic diastolic congestive heart failure - no SOB; continue Lasix 80 mg 1 tab by mouth daily; O2 PRN  Hypokalemia - continue KCL ER 20 meq 1 tab PO BID Lab Results  Component Value Date   K 3.6 12/05/2015   Gout - continue allopurinol 300 mg + and 100 mg = 400 mg by mouth daily   Depression - continue nortriptyline 25 mg 1 capsule by mouth daily at bedtime  Hyperlipidemia - continue Colestid 1 gm take 2 tabs = 2gm po daily and Lipitor 10 mg 1 tab by mouth daily Lab Results  Component Value Date   CHOL 158 07/20/2014   HDL 38.60* 07/20/2014   LDLCALC 73 09/30/2013   LDLDIRECT 80.0 07/20/2014   TRIG 332.0* 07/20/2014   CHOLHDL 4 07/20/2014    Neuropathy - continue gabapentin 800 mg 1 tab by mouth 3 times a day  Osteoarthritis of bilateral knees - continue Percocet 5/325 mg 1-2 tabs by mouth every 4 hours when necessary and Tylenol 325 mg take 2 tabs = 650 mg by mouth twice a day  Paroxysmal atrial fibrillation - rate-controlled; not on any rate controlling agent  Right flank hematoma - monitor for any redness      I have filled out patient's discharge paperwork and written prescriptions.  Patient will receive home health PT, OT, Nursing and CNA.  DME provided:  None  Total discharge time: Greater than 30 minutes  Discharge time involved coordination of the  discharge process with Education officer, museum, nursing staff and therapy department. Medical justification for home health services verified.    Durenda Age, NP [employer] [phone]

## 2015-12-21 ENCOUNTER — Encounter (HOSPITAL_COMMUNITY): Payer: Self-pay | Admitting: Internal Medicine

## 2015-12-21 ENCOUNTER — Telehealth (HOSPITAL_COMMUNITY): Payer: Self-pay | Admitting: *Deleted

## 2015-12-21 ENCOUNTER — Emergency Department (HOSPITAL_COMMUNITY): Payer: PPO

## 2015-12-21 ENCOUNTER — Inpatient Hospital Stay (HOSPITAL_COMMUNITY)
Admission: EM | Admit: 2015-12-21 | Discharge: 2016-01-01 | DRG: 291 | Disposition: A | Discharge status: Skilled Nursing Facility | Payer: PPO | Attending: Internal Medicine | Admitting: Internal Medicine

## 2015-12-21 ENCOUNTER — Other Ambulatory Visit: Payer: Self-pay

## 2015-12-21 ENCOUNTER — Ambulatory Visit (HOSPITAL_BASED_OUTPATIENT_CLINIC_OR_DEPARTMENT_OTHER)
Admission: RE | Admit: 2015-12-21 | Discharge: 2015-12-21 | Disposition: A | Discharge status: Home / Self Care | Payer: PPO | Source: Ambulatory Visit | Attending: Internal Medicine | Admitting: Internal Medicine

## 2015-12-21 ENCOUNTER — Encounter (HOSPITAL_COMMUNITY): Payer: Self-pay

## 2015-12-21 VITALS — BP 126/78 | HR 98 | Wt 238.0 lb

## 2015-12-21 DIAGNOSIS — I493 Ventricular premature depolarization: Secondary | ICD-10-CM | POA: Diagnosis not present

## 2015-12-21 DIAGNOSIS — R52 Pain, unspecified: Secondary | ICD-10-CM | POA: Diagnosis not present

## 2015-12-21 DIAGNOSIS — E785 Hyperlipidemia, unspecified: Secondary | ICD-10-CM | POA: Diagnosis not present

## 2015-12-21 DIAGNOSIS — Z888 Allergy status to other drugs, medicaments and biological substances status: Secondary | ICD-10-CM

## 2015-12-21 DIAGNOSIS — I5043 Acute on chronic combined systolic (congestive) and diastolic (congestive) heart failure: Secondary | ICD-10-CM | POA: Diagnosis present

## 2015-12-21 DIAGNOSIS — Z79899 Other long term (current) drug therapy: Secondary | ICD-10-CM

## 2015-12-21 DIAGNOSIS — G5793 Unspecified mononeuropathy of bilateral lower limbs: Secondary | ICD-10-CM | POA: Diagnosis present

## 2015-12-21 DIAGNOSIS — Z8249 Family history of ischemic heart disease and other diseases of the circulatory system: Secondary | ICD-10-CM

## 2015-12-21 DIAGNOSIS — Z7901 Long term (current) use of anticoagulants: Secondary | ICD-10-CM

## 2015-12-21 DIAGNOSIS — F419 Anxiety disorder, unspecified: Secondary | ICD-10-CM | POA: Diagnosis present

## 2015-12-21 DIAGNOSIS — E669 Obesity, unspecified: Secondary | ICD-10-CM | POA: Diagnosis present

## 2015-12-21 DIAGNOSIS — I5033 Acute on chronic diastolic (congestive) heart failure: Secondary | ICD-10-CM

## 2015-12-21 DIAGNOSIS — Z6838 Body mass index (BMI) 38.0-38.9, adult: Secondary | ICD-10-CM

## 2015-12-21 DIAGNOSIS — E875 Hyperkalemia: Secondary | ICD-10-CM | POA: Diagnosis present

## 2015-12-21 DIAGNOSIS — N183 Chronic kidney disease, stage 3 unspecified: Secondary | ICD-10-CM | POA: Diagnosis present

## 2015-12-21 DIAGNOSIS — R262 Difficulty in walking, not elsewhere classified: Secondary | ICD-10-CM | POA: Diagnosis not present

## 2015-12-21 DIAGNOSIS — Z9981 Dependence on supplemental oxygen: Secondary | ICD-10-CM | POA: Diagnosis not present

## 2015-12-21 DIAGNOSIS — W1830XD Fall on same level, unspecified, subsequent encounter: Secondary | ICD-10-CM | POA: Diagnosis not present

## 2015-12-21 DIAGNOSIS — I5032 Chronic diastolic (congestive) heart failure: Secondary | ICD-10-CM

## 2015-12-21 DIAGNOSIS — E039 Hypothyroidism, unspecified: Secondary | ICD-10-CM | POA: Diagnosis not present

## 2015-12-21 DIAGNOSIS — E038 Other specified hypothyroidism: Secondary | ICD-10-CM | POA: Diagnosis present

## 2015-12-21 DIAGNOSIS — E569 Vitamin deficiency, unspecified: Secondary | ICD-10-CM | POA: Diagnosis not present

## 2015-12-21 DIAGNOSIS — R05 Cough: Secondary | ICD-10-CM | POA: Diagnosis not present

## 2015-12-21 DIAGNOSIS — R040 Epistaxis: Secondary | ICD-10-CM | POA: Diagnosis not present

## 2015-12-21 DIAGNOSIS — E876 Hypokalemia: Secondary | ICD-10-CM | POA: Diagnosis present

## 2015-12-21 DIAGNOSIS — N390 Urinary tract infection, site not specified: Secondary | ICD-10-CM | POA: Diagnosis not present

## 2015-12-21 DIAGNOSIS — I480 Paroxysmal atrial fibrillation: Secondary | ICD-10-CM | POA: Diagnosis not present

## 2015-12-21 DIAGNOSIS — M069 Rheumatoid arthritis, unspecified: Secondary | ICD-10-CM | POA: Diagnosis not present

## 2015-12-21 DIAGNOSIS — I130 Hypertensive heart and chronic kidney disease with heart failure and stage 1 through stage 4 chronic kidney disease, or unspecified chronic kidney disease: Secondary | ICD-10-CM | POA: Diagnosis not present

## 2015-12-21 DIAGNOSIS — S301XXD Contusion of abdominal wall, subsequent encounter: Secondary | ICD-10-CM | POA: Diagnosis not present

## 2015-12-21 DIAGNOSIS — I5023 Acute on chronic systolic (congestive) heart failure: Secondary | ICD-10-CM | POA: Insufficient documentation

## 2015-12-21 DIAGNOSIS — I429 Cardiomyopathy, unspecified: Secondary | ICD-10-CM | POA: Diagnosis not present

## 2015-12-21 DIAGNOSIS — N179 Acute kidney failure, unspecified: Secondary | ICD-10-CM | POA: Diagnosis not present

## 2015-12-21 DIAGNOSIS — G4733 Obstructive sleep apnea (adult) (pediatric): Secondary | ICD-10-CM | POA: Diagnosis not present

## 2015-12-21 DIAGNOSIS — R41841 Cognitive communication deficit: Secondary | ICD-10-CM | POA: Diagnosis not present

## 2015-12-21 DIAGNOSIS — I252 Old myocardial infarction: Secondary | ICD-10-CM | POA: Diagnosis not present

## 2015-12-21 DIAGNOSIS — I472 Ventricular tachycardia, unspecified: Secondary | ICD-10-CM | POA: Insufficient documentation

## 2015-12-21 DIAGNOSIS — Z87891 Personal history of nicotine dependence: Secondary | ICD-10-CM

## 2015-12-21 DIAGNOSIS — R0600 Dyspnea, unspecified: Secondary | ICD-10-CM

## 2015-12-21 DIAGNOSIS — I4891 Unspecified atrial fibrillation: Secondary | ICD-10-CM | POA: Diagnosis not present

## 2015-12-21 DIAGNOSIS — I2510 Atherosclerotic heart disease of native coronary artery without angina pectoris: Secondary | ICD-10-CM | POA: Diagnosis present

## 2015-12-21 DIAGNOSIS — I209 Angina pectoris, unspecified: Secondary | ICD-10-CM | POA: Diagnosis not present

## 2015-12-21 DIAGNOSIS — T148XXA Other injury of unspecified body region, initial encounter: Secondary | ICD-10-CM

## 2015-12-21 DIAGNOSIS — Z91048 Other nonmedicinal substance allergy status: Secondary | ICD-10-CM | POA: Diagnosis not present

## 2015-12-21 DIAGNOSIS — T148 Other injury of unspecified body region: Secondary | ICD-10-CM

## 2015-12-21 DIAGNOSIS — I110 Hypertensive heart disease with heart failure: Secondary | ICD-10-CM | POA: Diagnosis not present

## 2015-12-21 DIAGNOSIS — M6281 Muscle weakness (generalized): Secondary | ICD-10-CM | POA: Diagnosis not present

## 2015-12-21 DIAGNOSIS — R0902 Hypoxemia: Secondary | ICD-10-CM | POA: Diagnosis present

## 2015-12-21 DIAGNOSIS — Z95810 Presence of automatic (implantable) cardiac defibrillator: Secondary | ICD-10-CM | POA: Diagnosis not present

## 2015-12-21 DIAGNOSIS — R2689 Other abnormalities of gait and mobility: Secondary | ICD-10-CM | POA: Diagnosis not present

## 2015-12-21 DIAGNOSIS — G629 Polyneuropathy, unspecified: Secondary | ICD-10-CM | POA: Diagnosis not present

## 2015-12-21 DIAGNOSIS — T502X5A Adverse effect of carbonic-anhydrase inhibitors, benzothiadiazides and other diuretics, initial encounter: Secondary | ICD-10-CM | POA: Diagnosis not present

## 2015-12-21 DIAGNOSIS — K5900 Constipation, unspecified: Secondary | ICD-10-CM | POA: Diagnosis not present

## 2015-12-21 DIAGNOSIS — I10 Essential (primary) hypertension: Secondary | ICD-10-CM | POA: Diagnosis not present

## 2015-12-21 DIAGNOSIS — G608 Other hereditary and idiopathic neuropathies: Secondary | ICD-10-CM | POA: Diagnosis not present

## 2015-12-21 DIAGNOSIS — R0602 Shortness of breath: Secondary | ICD-10-CM | POA: Diagnosis not present

## 2015-12-21 DIAGNOSIS — F329 Major depressive disorder, single episode, unspecified: Secondary | ICD-10-CM | POA: Diagnosis not present

## 2015-12-21 DIAGNOSIS — I509 Heart failure, unspecified: Secondary | ICD-10-CM | POA: Diagnosis not present

## 2015-12-21 DIAGNOSIS — R4182 Altered mental status, unspecified: Secondary | ICD-10-CM | POA: Diagnosis not present

## 2015-12-21 DIAGNOSIS — Z23 Encounter for immunization: Secondary | ICD-10-CM | POA: Diagnosis not present

## 2015-12-21 HISTORY — DX: Paroxysmal atrial fibrillation: I48.0

## 2015-12-21 HISTORY — DX: Chronic kidney disease, stage 3 (moderate): N18.3

## 2015-12-21 LAB — CBC
HCT: 40.5 % (ref 36.0–46.0)
Hemoglobin: 12.5 g/dL (ref 12.0–15.0)
MCH: 29.3 pg (ref 26.0–34.0)
MCHC: 30.9 g/dL (ref 30.0–36.0)
MCV: 95.1 fL (ref 78.0–100.0)
PLATELETS: 247 10*3/uL (ref 150–400)
RBC: 4.26 MIL/uL (ref 3.87–5.11)
RDW: 16.1 % — ABNORMAL HIGH (ref 11.5–15.5)
WBC: 7 10*3/uL (ref 4.0–10.5)

## 2015-12-21 LAB — I-STAT TROPONIN, ED: TROPONIN I, POC: 0.06 ng/mL (ref 0.00–0.08)

## 2015-12-21 LAB — BASIC METABOLIC PANEL
Anion gap: 11 (ref 5–15)
BUN: 18 mg/dL (ref 6–20)
CALCIUM: 9.5 mg/dL (ref 8.9–10.3)
CHLORIDE: 97 mmol/L — AB (ref 101–111)
CO2: 30 mmol/L (ref 22–32)
CREATININE: 1.17 mg/dL — AB (ref 0.44–1.00)
GFR calc Af Amer: 49 mL/min — ABNORMAL LOW (ref >60)
GFR calc non Af Amer: 43 mL/min — ABNORMAL LOW (ref >60)
GLUCOSE: 121 mg/dL — AB (ref 65–99)
Potassium: 3 mmol/L — ABNORMAL LOW (ref 3.5–5.1)
Sodium: 138 mmol/L (ref 135–145)

## 2015-12-21 LAB — BRAIN NATRIURETIC PEPTIDE: B Natriuretic Peptide: 948 pg/mL — ABNORMAL HIGH (ref 0.0–100.0)

## 2015-12-21 MED ORDER — FUROSEMIDE 10 MG/ML IJ SOLN
40.0000 mg | Freq: Once | INTRAMUSCULAR | Status: AC
  Administered 2015-12-21: 40 mg via INTRAVENOUS
  Filled 2015-12-21: qty 4

## 2015-12-21 MED ORDER — FUROSEMIDE 10 MG/ML IJ SOLN: 80.0000 mg | Freq: Once | INTRAMUSCULAR | Status: DC

## 2015-12-21 MED ORDER — POTASSIUM CHLORIDE CRYS ER 20 MEQ PO TBCR
40.0000 meq | EXTENDED_RELEASE_TABLET | Freq: Once | ORAL | Status: AC
  Administered 2015-12-21: 40 meq via ORAL
  Filled 2015-12-21: qty 2

## 2015-12-21 MED ORDER — FUROSEMIDE 80 MG PO TABS
ORAL_TABLET | ORAL | Status: DC
Start: 2015-12-21 — End: 2016-01-01

## 2015-12-21 MED ORDER — POTASSIUM CHLORIDE CRYS ER 20 MEQ PO TBCR: 40.0000 meq | EXTENDED_RELEASE_TABLET | Freq: Two times a day (BID) | ORAL | Status: DC

## 2015-12-21 NOTE — Patient Instructions (Signed)
Follow up with Dr Hulen Skains at [facility]  Follow up with [place] AS NEEDED

## 2015-12-21 NOTE — ED Provider Notes (Signed)
CSN: [id number]     Arrival date & time 12/21/15  1821 History   First MD Initiated Contact with Patient 12/21/15 1851     Chief Complaint  Patient presents with  . Shortness of Breath     (Consider location/radiation/quality/duration/timing/severity/associated sxs/prior Treatment) Patient is a 82 y.o. female presenting with shortness of breath. The history is provided by the patient.  Shortness of Breath Severity:  Moderate Onset quality:  Gradual Duration:  1 day Timing:  Constant Progression:  Unchanged Chronicity:  New Context comment:  CHF Relieved by:  Nothing Worsened by:  Activity Ineffective treatments:  Diuretics Associated symptoms: no chest pain, no cough and no fever     Past Medical History  Diagnosis Date  . Pacemaker   . ICD (implantable cardiac defibrillator) in place   . Myocardial infarction ([place]) 2008  . Sleep apnea   . Arthritis   . Neuromuscular disorder ([place])   . CHF (congestive heart failure) ([place])   . Hypertension   . Dysrhythmia   . Chronic systolic heart failure ([place])   . Acute systolic heart failure ([place])   . Acute blood loss anemia   . Depression 11/24/2015  . Physical deconditioning   . Hypokalemia   . Chronic combined systolic and diastolic heart failure ([place])   . A-fib ([place])   . Right flank hematoma    Past Surgical History  Procedure Laterality Date  . Cholecystectomy    . Abdominal hysterectomy    . Ankle surgery    . Insert / replace / remove pacemaker  2008  . Knee ligament reconstruction  2012  . Tonsillectomy    . Cardiac catheterization    . Eye surgery     Family History  Problem Relation Age of Onset  . Tuberculosis Father   . Stroke Mother   . Congestive Heart Failure Mother   . Diabetes Brother   . Stroke Brother   . Cancer Brother   . Heart attack Son    Social History  Substance Use Topics  . Smoking status: Former Research scientist (life sciences)  . Smokeless tobacco: Former Systems developer  . Alcohol Use: No   OB History    No data  available     Review of Systems  Constitutional: Negative for fever.  Respiratory: Positive for shortness of breath. Negative for cough.   Cardiovascular: Negative for chest pain.  All other systems reviewed and are negative.     Allergies  Quinapril hcl; Tape; and Neosporin  Home Medications   Prior to Admission medications   Medication Sig Start Date End Date Taking? Authorizing Provider  acetaminophen (TYLENOL) 325 MG tablet Take 650 mg by mouth 2 (two) times daily.   Yes Historical Provider, MD  allopurinol (ZYLOPRIM) 100 MG tablet Take 100 mg by mouth daily. Takes along with a 300 mg tablet to equal 400 mg   Yes Historical Provider, MD  allopurinol (ZYLOPRIM) 300 MG tablet Take 300 mg by mouth daily. Take with the 100mg  tablet to equal 400mg  daily   Yes Historical Provider, MD  atorvastatin (LIPITOR) 10 MG tablet Take 10 mg by mouth daily at 6 PM.    Yes Historical Provider, MD  Calcium Carbonate-Vitamin D (CALCIUM 600+D) 600-400 MG-UNIT per tablet Take 1 tablet by mouth daily.    Yes Historical Provider, MD  Cholecalciferol (VITAMIN D3) 1000 UNITS CAPS Take 1 capsule by mouth daily.   Yes Historical Provider, MD  colestipol (COLESTID) 1 G tablet Take 2 g by mouth at bedtime.  Yes Historical Provider, MD  furosemide (LASIX) 80 MG tablet Take 1 tab in AM and 1/2 tab in PM Patient taking differently: Take 40-80 mg by mouth 2 (two) times daily. Take 1 tab in AM and 1/2 tab in PM 12/21/15  Yes Jolaine Artist, MD  gabapentin (NEURONTIN) 800 MG tablet Take 800 mg by mouth 3 (three) times daily.   Yes Historical Provider, MD  guaiFENesin (MUCINEX) 600 MG 12 hr tablet Take 1 tablet (600 mg total) by mouth 2 (two) times daily. 11/29/15  Yes Theodis Blaze, MD  nitroGLYCERIN (NITROSTAT) 0.4 MG SL tablet Place 1 tablet (0.4 mg total) under the tongue every 5 (five) minutes as needed for chest pain. 11/24/12  Yes Jettie Booze, MD  nortriptyline (PAMELOR) 25 MG capsule Take 25 mg by  mouth at bedtime.   Yes Historical Provider, MD  OXYGEN Inhale 2 L/min into the lungs at bedtime as needed (uses as bedtime everynight and then occasionally iun the daytime as needed for SOB).    Yes Historical Provider, MD  potassium chloride SA (K-DUR,KLOR-CON) 20 MEQ tablet Take 2 tablets (40 mEq total) by mouth 2 (two) times daily. 12/21/15  Yes Shaune Pascal Bensimhon, MD   BP 121/89 mmHg  Pulse 96  Temp(Src) 97.4 F (36.3 C)  Resp 29  Ht 5\' 6"  (1.676 m)  Wt 238 lb (107.956 kg)  BMI 38.43 kg/m2  SpO2 94% Physical Exam  Constitutional: She is oriented to person, place, and time. She appears well-developed and well-nourished. No distress.  HENT:  Head: Normocephalic.  Eyes: Conjunctivae are normal.  Neck: Neck supple. No tracheal deviation present.  Cardiovascular: Normal rate, regular rhythm and normal heart sounds.   Pulmonary/Chest: Effort normal. No respiratory distress. She has rales (b/l lower fields).  Abdominal: Soft. She exhibits no distension.  Neurological: She is alert and oriented to person, place, and time.  Skin: Skin is warm and dry.  Psychiatric: She has a normal mood and affect.  Vitals reviewed.   ED Course  Procedures (including critical care time) Labs Review Labs Reviewed  BASIC METABOLIC PANEL - Abnormal; Notable for the following:    Potassium 3.0 (*)    Chloride 97 (*)    Glucose, Bld 121 (*)    Creatinine, Ser 1.17 (*)    GFR calc non Af Amer 43 (*)    GFR calc Af Amer 49 (*)    All other components within normal limits  CBC - Abnormal; Notable for the following:    RDW 16.1 (*)    All other components within normal limits  BRAIN NATRIURETIC PEPTIDE  I-STAT TROPOININ, ED    Imaging Review Dg Chest 2 View  12/21/2015  CLINICAL DATA:  Shortness of breath off and on for 1 month. EXAM: CHEST  2 VIEW COMPARISON:  11/24/2015 FINDINGS: Low lung volumes on this AP and lateral exam. The cardio pericardial silhouette is enlarged. No focal airspace  consolidation, pulmonary edema, or pleural effusion. Left-sided pacer/ AICD remains in place. The visualized bony structures of the thorax are intact. IMPRESSION: Low volume film without acute cardiopulmonary findings. Electronically Signed   By: Misty Stanley M.D.   On: 12/21/2015 19:26   I have personally reviewed and evaluated these images and lab results as part of my medical decision-making.   EKG Interpretation None      MDM   Final diagnoses:  Acute on chronic diastolic congestive heart failure (HCC)  Hypoxemia    82 y.o. female presents  with pparent fluid overload and diastolic heart failure. Seen in CHF clinic today and recommended increase in diuretics, developed dyspnea and mildly hypoxemic without any activity here. Provided IV lasix and started diuresing but no change in respiratory symptoms. [facility] admit for ongoing diuresis and monitoring. Hospitalist was consulted for admission and will see the patient in the emergency department.     Leo Grosser, MD 12/22/15 [phone]

## 2015-12-21 NOTE — Progress Notes (Signed)
Patient ID: Bianca [name], female   DOB: 28-Dec-1932, 82 y.o.   MRN: [id number]    Advanced Heart Failure Clinic Note   Primary Physician: Maurice Small, MD Primary Cardiologist: Dr Marlou Porch Primary HF: Dr. Haroldine Laws   HPI:  Bianca [name] is a 82 y.o. female with nonischemic cardiomyopathy, hx of atrial fibrillation-currently with chronic anticoagulation, hx of Chronic kidney disease-stage III, chronic systolic heart failure-status post Bi- ventricular ICD-followed by Dr. Lovena Le. No defibrillations. EF 35%. Symptoms have been very stable. NYHA class II except for hospitalization for heart failure she was discharged on 11/24/12 with primary diagnosis of acute on chronic systolic heart failure.  Admitted 10/24/15 after fall and developing a flank hematoma. HF team consulted to help manage fluid overload while getting multiple blood products to reverse her INR. Got 4u FFP and 2 RBCs, initially. HF team continued to follow for fluid management, including several doses of IV lasix. Lasix increased to 40 mg daily po prior to d/c to SNF.  Discharge weight 253 lbs. Echo in 5/17 with EF 50-55%  Admitted 6/8 -11/29/15 with acute onset SOB and CP. She required BMET and CXR significant for pulmonary edema.  She diuresed 13 lbs.  Discharge weight 235 lbs. Lasix increased to 40 mg po BID.   She presents today for follow up.  She was discharged from [facility] this [facility], and is going to [facility]. Has been been doing well. Taking 80 mg lasix in the am.  Evening dose was keeping her up all night so stopped.  Flank hematoma still very large, but has not gotten any bigger. No ecchymosis. No orthopnea or PND. Mild ankle edema. No DOE working with therapy at rehab. Still of coumadin.    Past Medical History  Diagnosis Date  . Pacemaker   . ICD (implantable cardiac defibrillator) in place   . Myocardial infarction ([place]) 2008  . Sleep apnea   . Arthritis   . Neuromuscular disorder ([place])   . CHF (congestive  heart failure) ([place])   . Hypertension   . Dysrhythmia   . Chronic systolic heart failure ([place])   . Acute systolic heart failure ([place])   . Acute blood loss anemia   . Depression 11/24/2015  . Physical deconditioning   . Hypokalemia   . Chronic combined systolic and diastolic heart failure ([place])   . A-fib ([place])   . Right flank hematoma     Current Outpatient Prescriptions  Medication Sig Dispense Refill  . acetaminophen (TYLENOL) 325 MG tablet Take 650 mg by mouth 2 (two) times daily.    Marland Kitchen allopurinol (ZYLOPRIM) 100 MG tablet Take 100 mg by mouth daily. Takes along with a 300 mg tablet to equal 400 mg    . allopurinol (ZYLOPRIM) 300 MG tablet Take 300 mg by mouth daily. Take with the 100mg  tablet to equal 400mg  daily    . atorvastatin (LIPITOR) 10 MG tablet Take 10 mg by mouth daily.    . Calcium Carbonate-Vitamin D (CALCIUM 600+D) 600-400 MG-UNIT per tablet Take 1 tablet by mouth 2 (two) times daily.    . Cholecalciferol (VITAMIN D3) 1000 UNITS CAPS Take 1 capsule by mouth daily.    . colestipol (COLESTID) 1 G tablet Take 2 g by mouth daily.     . furosemide (LASIX) 80 MG tablet Take 80 mg by mouth every morning.    . gabapentin (NEURONTIN) 800 MG tablet Take 800 mg by mouth 3 (three) times daily.    Marland Kitchen guaiFENesin (MUCINEX) 600  MG 12 hr tablet Take 1 tablet (600 mg total) by mouth 2 (two) times daily.    . Multiple Vitamin (MULTIVITAMIN) tablet Take 1 tablet by mouth daily.    . nortriptyline (PAMELOR) 25 MG capsule Take 25 mg by mouth at bedtime.    . OXYGEN Inhale 2 L/min into the lungs continuous as needed (shortness of breath).     . potassium chloride SA (K-DUR,KLOR-CON) 20 MEQ tablet Take 20 mEq by mouth 2 (two) times daily.    . Protein (PROCEL) POWD Take 1 scoop by mouth 2 (two) times daily.    . nitroGLYCERIN (NITROSTAT) 0.4 MG SL tablet Place 1 tablet (0.4 mg total) under the tongue every 5 (five) minutes as needed for chest pain. (Patient not taking: Reported on 12/21/2015) 25  tablet 6   No current facility-administered medications for this encounter.    Allergies  Allergen Reactions  . Quinapril Hcl Swelling    Tongue and throat  . Tape     Plastic tape causes irritation.   . Neosporin [Neomycin-Polymyxin-Gramicidin] Rash      Social History   Social History  . Marital Status: Widowed    Spouse Name: N/A  . Number of Children: N/A  . Years of Education: N/A   Occupational History  . Not on file.   Social History Main Topics  . Smoking status: Former Research scientist (life sciences)  . Smokeless tobacco: Former Systems developer  . Alcohol Use: No  . Drug Use: No  . Sexual Activity: Not Currently   Other Topics Concern  . Not on file   Social History Narrative      Family History  Problem Relation Age of Onset  . Tuberculosis Father   . Stroke Mother   . Congestive Heart Failure Mother   . Diabetes Brother   . Stroke Brother   . Cancer Brother   . Heart attack Son     Danley Danker Vitals:   12/21/15 1131  BP: 126/78  Pulse: 98  Weight: 238 lb (107.956 kg)  SpO2: 92%   Wt Readings from Last 3 Encounters:  12/21/15 238 lb (107.956 kg)  12/19/15 236 lb 12.8 oz (107.412 kg)  12/05/15 239 lb 9.6 oz (108.682 kg)      PHYSICAL EXAM: General: Elderly appearing. NAD. In [place]. Daughter present.   HEENT: normal Neck: supple. JVP 6-7, Carotids 2+ bilat; no bruits. No thyromegaly or nodule noted.  Cor: PMI nondisplaced. RRR, no M/G/R Lungs: Clear , normal effort On 2 liters [place].  Abdomen: obese soft, nondistended. Large Right flank hematoma, still of significant size. no ecchymosis. No HSM. No bruits or masses. Good bowel sounds. Extremities: no cyanosis, clubbing, rash, trace ankle edema, Neuro: alert & orientedx3, cranial nerves grossly intact. moves all 4 extremities w/o difficulty. Affect pleasant   ASSESSMENT & PLAN:  1. NICM EF 35% s/p BiV ICD - [employer]. -> EF 50-55% on echo 10/2014 2. Paroxysmal AF - CHA2DS2-VASc 6 3. Morbid obesity with h/o CO2 retention   4. R Flank Hematoma  Continue lasix 80 mg daily.   She switched from 40 mg BID as it was keeping her up at night.   Should take an extra 40 mg in the afternoon for weight gains of 5 lbs from baseline on home scale.    Will continue to hold off on blood thinner for now until can get into surgery for re-evaluation of her hematoma.   She needs to be on anticoag with CHA2DS2-VASc 6.  Recommend Eliquis.   She  should continue to see Dr Marlou Porch.  Can follow up with HF clinic as needed.   Legrand Como [address] [place], PA-C 12/21/2015 11:47 AM   Patient seen and examined with Oda Kilts, PA-C. We discussed all aspects of the encounter. I agree with the assessment and plan as stated above.   Patient overall felt to be doing well. Weight near baseline. Just minimal ankle edema and lungs clear. However after patient left ICD interrogation came back and Optivol fluid index was up so we added lasix 40mg  every afternoon. No VT/AF on interrogation. Will refer back to surgery to evaluate persistent large flank hematoma which is not resorbing. With high CHADsVASC score would recommend Eliquis.   Can f/u with the HF Clinic on prn basis.   Bensimhon, Daniel,MD 10:46 PM

## 2015-12-21 NOTE — Telephone Encounter (Signed)
After pt had left appt today Dr Haroldine Laws received her Optivol report which showed fluid was elevated.  He would like pt to increase Lasix to 80 mg in AM and 40 mg in PM and increase KCL to 40 meq BID with labs in 1 week.  Spoke w/pt's son, he is aware, agreeable and verbalizes understanding.  Pt has been set up with [facility], order for bmet next week faxed to them at [phone]

## 2015-12-21 NOTE — ED Notes (Signed)
RN placed pt on O2 due to O2 levels in the high 80's

## 2015-12-21 NOTE — H&P (Signed)
History and Physical  Patient Name: Bianca [name]     [id number]    DOB: Feb 10, 1933    DOA: 12/21/2015 PCP: Jonathon Bellows, MD   Patient coming from: [facility]  Chief Complaint: Dyspnea  HPI: Bianca [name] is a 82 y.o. female with a past medical history significant for pAF off anticoagulation right now, HTN, chronic systolic and diastolic CHF (last EF [id number]), pacer/ICD and recent subcutaneous flank hematoma who presents with dyspnea.  The patient suffered a fall in early May, developed a large hematoma in the superficial subcutaneous tissues of the right flank requiring several transfusions, hospitalization, treated by CHF management by cardiology, and discharged to SNF (had previously lived independently with only night time aides).  From SNF, she was readmitted 6/8 to 6/13 to our service for CHF, diuresed, and discharged back to SNF [facility]) uneventfully.  Since discharge, she was taking Lasix 40 mg BID which she changed to 80 mg once daily given trouble with nocturia, but has been continuing rehab at [facility], able to ambulate with PT, and without change in her chronic LE edema, orthopnea, and without new PND.  She has had, however, intermittent "attacks" of dyspnea, during which she feels she is smothering and has hot flashes, and feels she is dying.  Today, she was seen in CHF clinic for routine hospital follow up, felt to be euvolemic, but noted afterwards to have increased Optivol index on pacer interrogation, and so was told by phone to take an extra Lasix in the afternoon.  However, she had another attack in the evening, and her family were concerned that her CHF wasn't treated and so brought her to the ER.  ED course: -Afebrile, tachycardic, intermittently hypoxic, BP stable -Na 138, K 3.0, Cr 1.17 (baseline 0.9-1.1), WBC 7K, Hgb 12.5, BNP up to 950 from 700 during last admission, troponin negative -ECG showed paced rhythm, prolonged QTc, CXR without edema -She was  given K and furosemide IV and TRH were asked to evaluate for admision     ROS: Pt complains of anxiety, decreased appetite, hot spells with dyspnea, fatigue all the time, not exertional, chronic orthopnea and constipation.  All other systems negative except as just noted or noted in the history of present illness.    Past Medical History  Diagnosis Date  . Pacemaker   . ICD (implantable cardiac defibrillator) in place   . Myocardial infarction ([place]) 2008  . Sleep apnea   . Arthritis   . Neuromuscular disorder ([place])   . CHF (congestive heart failure) ([place])   . Hypertension   . Dysrhythmia   . Chronic systolic heart failure ([place])   . Acute systolic heart failure ([place])   . Acute blood loss anemia   . Depression 11/24/2015  . Physical deconditioning   . Hypokalemia   . Chronic combined systolic and diastolic heart failure ([place])   . A-fib ([place])   . Right flank hematoma     Past Surgical History  Procedure Laterality Date  . Cholecystectomy    . Abdominal hysterectomy    . Ankle surgery    . Insert / replace / remove pacemaker  2008  . Knee ligament reconstruction  2012  . Tonsillectomy    . Cardiac catheterization    . Eye surgery      Social History: Patient lived at home alone until May when she was in rehab for two months and just today moved in to an ALF.  The patient walks unassisted.  She is a remote former smoker.  No alcohol.  No dementia.  She has a son who recently died at age 35, and feels this may be contributing.    Allergies  Allergen Reactions  . Quinapril Hcl Swelling    Tongue and throat  . Tape Other (See Comments)    Plastic tape causes irritation.   . Neosporin [Neomycin-Polymyxin-Gramicidin] Rash    Family history: family history includes Cancer in her brother; Congestive Heart Failure in her mother; Diabetes in her brother; Heart attack in her son; Stroke in her brother and mother; Tuberculosis in her father.  Prior to Admission medications     Medication Sig Start Date End Date Taking? Authorizing Provider  acetaminophen (TYLENOL) 325 MG tablet Take 650 mg by mouth 2 (two) times daily.   Yes Historical Provider, MD  allopurinol (ZYLOPRIM) 100 MG tablet Take 100 mg by mouth daily. Takes along with a 300 mg tablet to equal 400 mg   Yes Historical Provider, MD  allopurinol (ZYLOPRIM) 300 MG tablet Take 300 mg by mouth daily. Take with the 100mg  tablet to equal 400mg  daily   Yes Historical Provider, MD  atorvastatin (LIPITOR) 10 MG tablet Take 10 mg by mouth daily at 6 PM.    Yes Historical Provider, MD  Calcium Carbonate-Vitamin D (CALCIUM 600+D) 600-400 MG-UNIT per tablet Take 1 tablet by mouth daily.    Yes Historical Provider, MD  Cholecalciferol (VITAMIN D3) 1000 UNITS CAPS Take 1 capsule by mouth daily.   Yes Historical Provider, MD  colestipol (COLESTID) 1 G tablet Take 2 g by mouth at bedtime.    Yes Historical Provider, MD  furosemide (LASIX) 80 MG tablet Take 1 tab in AM and 1/2 tab in PM Patient taking differently: Take 40-80 mg by mouth 2 (two) times daily. Take 1 tab in AM and 1/2 tab in PM 12/21/15  Yes Jolaine Artist, MD  gabapentin (NEURONTIN) 800 MG tablet Take 800 mg by mouth 3 (three) times daily.   Yes Historical Provider, MD  guaiFENesin (MUCINEX) 600 MG 12 hr tablet Take 1 tablet (600 mg total) by mouth 2 (two) times daily. 11/29/15  Yes Theodis Blaze, MD  nitroGLYCERIN (NITROSTAT) 0.4 MG SL tablet Place 1 tablet (0.4 mg total) under the tongue every 5 (five) minutes as needed for chest pain. 11/24/12  Yes Jettie Booze, MD  nortriptyline (PAMELOR) 25 MG capsule Take 25 mg by mouth at bedtime.   Yes Historical Provider, MD  OXYGEN Inhale 2 L/min into the lungs at bedtime as needed (uses as bedtime everynight and then occasionally iun the daytime as needed for SOB).    Yes Historical Provider, MD  potassium chloride SA (K-DUR,KLOR-CON) 20 MEQ tablet Take 2 tablets (40 mEq total) by mouth 2 (two) times daily. 12/21/15   Yes Jolaine Artist, MD       Physical Exam: BP 122/91 mmHg  Pulse 100  Temp(Src) 97.4 F (36.3 C)  Resp 17  Ht 5\' 6"  (1.676 m)  Wt 107.956 kg (238 lb)  BMI 38.43 kg/m2  SpO2 96% General appearance: Well-developed, obese female, alert and in no acute distress.   Eyes: Conjunctiva normal, lids and lashes normal.   PERRL.  ENT: No nasal deformity, discharge.  OP moist without lesions.   Skin: Warm and dry.  No suspicious rashes or lesions.  There is still swelling in the right flank at the site of her hematoma. Cardiac: RRR, nl S1-S2, no murmurs appreciated.  Capillary refill  is brisk.  JVP not visible.  Trace LE edema.  Radial and DP pulses 2+ and symmetric. Respiratory: Normal respiratory rate and rhythm.  Bibasilar crackles, atelectasis favored.  No wheezes. GI: Abdomen soft without rigidity.  No TTP. No ascites, distension, hepatosplenomegaly.   MSK: No deformities or effusions.  No clubbing/cyanosis. Neuro: Cranial nerves normal. Sensorium intact and responding to questions, attention normal.  Speech is fluent.  Moves all extremities equally and with normal coordination.    Psych: Affect anxious, blunted.  Judgment and insight appear normal.       Labs on Admission:  I have personally reviewed following labs and imaging studies: CBC:  Recent Labs Lab 12/21/15 1829  WBC 7.0  HGB 12.5  HCT 40.5  MCV 95.1  PLT [id number]   Basic Metabolic Panel:  Recent Labs Lab 12/21/15 1829  NA 138  K 3.0*  CL 97*  CO2 30  GLUCOSE 121*  BUN 18  CREATININE 1.17*  CALCIUM 9.5   GFR: Estimated Creatinine Clearance: 46.9 mL/min (by C-G formula based on Cr of 1.17).       Radiological Exams on Admission: Personally reviewed: Dg Chest 2 View  12/21/2015  CLINICAL DATA:  Shortness of breath off and on for 1 month. EXAM: CHEST  2 VIEW COMPARISON:  11/24/2015 FINDINGS: Low lung volumes on this AP and lateral exam. The cardio pericardial silhouette is enlarged. No focal airspace  consolidation, pulmonary edema, or pleural effusion. Left-sided pacer/ AICD remains in place. The visualized bony structures of the thorax are intact. IMPRESSION: Low volume film without acute cardiopulmonary findings. Electronically Signed   By: Misty Stanley M.D.   On: 12/21/2015 19:26    EKG: Independently reviewed. Paced rhythm, rate 98, QTc prolonged.    Assessment/Plan  1. Dyspnea:  Etiology here unclear.  Do not suspect this is substantial worsening of CHF, given that she describes "spells" and "attacks" and "episodes" of feeling short of breath and flushed that are brief, and not a progressive shortness of breath over days.  ICD interrogation in clinic today ruled out that these are VT or AF episodes.  Suspect that this is some anxiety/grief exacerbated by her son's death.  She has hypoxia in the past, and so that in and of itself is not unusual.  Wonder if some of her symptoms are from progression of her sleep apnea and CO2 retention, and I do not see that she has regular follow up with Pulm -Tele overnight -Check ABG and consider referral to [facility] as outpatient   2. Hypokalemia:  -Check magnesium -Continue supplement -Repeat BMP  3. Chronic diastolic CHF:  Last EF 99991111, previously lower.   -Agree with single extra dose of IV furosemide then resume home furosemide, check BMET in 1 week with [place] arranged by Cardiology -Hold statin and colestipol while OBS status  4. Neuropathy:  -Continue gabapentin and nortriptyline  5. pAF:  CHADS2Vasc 6.  Not on anticoagulation since hematoma, but likely will start Xarelto when cleared by surgery soon.  6. OSA on CPAP:  -Continue CPAP therapy -ABG ordered and may need repeat PSG  7. CKD III:  Stable, not previously on problem list     DVT prophylaxis: SCDs  Code Status: FULL  Family Communication: Daughter at bedside  Disposition Plan: Anticipate observe on tele overnight, check ABG and possibly discharge if breathing stable in  AM.  Consider discharge with very low dose BZD and anxiety/grief follow up with PCP. Consults called: None Admission status: OBS, tele At  the point of initial evaluation, it is my clinical opinion that admission for OBSERVATION is reasonable and necessary because the patient's presenting complaints in the context of their chronic conditions represent sufficient risk of deterioration or significant morbidity to constitute reasonable grounds for close observation in the hospital setting, but that the patient may be medically stable for discharge from the hospital within 24 to 48 hours.    Medical decision making: Patient seen at 11:14 PM on 12/21/2015.  The patient was discussed with Dr. Laneta Simmers. What exists of the patient's chart was reviewed in depth.  Clinical condition: stable.        Edwin Dada [facility] Pager [phone]

## 2015-12-21 NOTE — ED Notes (Signed)
EDP at bedside  

## 2015-12-21 NOTE — ED Notes (Signed)
Patient here with increasing shortness of breath since appointment today with heart failure clinic. denies chest pain. States she was called this afternoon from the clinic and instructed to increase her lasix by additional 40mg  and did so at 4pm today. Speaking complete sentences but obvious shortness of breath on arrival

## 2015-12-22 ENCOUNTER — Encounter (HOSPITAL_COMMUNITY): Payer: Self-pay | Admitting: Family Medicine

## 2015-12-22 DIAGNOSIS — N183 Chronic kidney disease, stage 3 unspecified: Secondary | ICD-10-CM

## 2015-12-22 DIAGNOSIS — I5032 Chronic diastolic (congestive) heart failure: Secondary | ICD-10-CM | POA: Diagnosis not present

## 2015-12-22 DIAGNOSIS — I5033 Acute on chronic diastolic (congestive) heart failure: Secondary | ICD-10-CM | POA: Diagnosis not present

## 2015-12-22 DIAGNOSIS — R0902 Hypoxemia: Secondary | ICD-10-CM | POA: Insufficient documentation

## 2015-12-22 DIAGNOSIS — Z95810 Presence of automatic (implantable) cardiac defibrillator: Secondary | ICD-10-CM | POA: Diagnosis not present

## 2015-12-22 DIAGNOSIS — I480 Paroxysmal atrial fibrillation: Secondary | ICD-10-CM | POA: Diagnosis not present

## 2015-12-22 DIAGNOSIS — E876 Hypokalemia: Secondary | ICD-10-CM | POA: Diagnosis not present

## 2015-12-22 DIAGNOSIS — G4733 Obstructive sleep apnea (adult) (pediatric): Secondary | ICD-10-CM | POA: Diagnosis not present

## 2015-12-22 HISTORY — DX: Chronic kidney disease, stage 3 unspecified: N18.30

## 2015-12-22 LAB — URINE MICROSCOPIC-ADD ON: RBC / HPF: NONE SEEN RBC/hpf (ref 0–5)

## 2015-12-22 LAB — URINALYSIS, ROUTINE W REFLEX MICROSCOPIC
Bilirubin Urine: NEGATIVE
Glucose, UA: NEGATIVE mg/dL
Hgb urine dipstick: NEGATIVE
Ketones, ur: NEGATIVE mg/dL
Nitrite: NEGATIVE
PROTEIN: NEGATIVE mg/dL
Specific Gravity, Urine: 1.015 (ref 1.005–1.030)
pH: 6 (ref 5.0–8.0)

## 2015-12-22 LAB — I-STAT ARTERIAL BLOOD GAS, ED
Acid-Base Excess: 7 mmol/L — ABNORMAL HIGH (ref 0.0–2.0)
Bicarbonate: 29.9 mEq/L — ABNORMAL HIGH (ref 20.0–24.0)
O2 Saturation: 95 %
PCO2 ART: 37.1 mmHg (ref 35.0–45.0)
PH ART: 7.514 — AB (ref 7.350–7.450)
PO2 ART: 65 mmHg — AB (ref 80.0–100.0)
Patient temperature: 98
TCO2: 31 mmol/L (ref 0–100)

## 2015-12-22 LAB — BASIC METABOLIC PANEL
ANION GAP: 11 (ref 5–15)
BUN: 15 mg/dL (ref 6–20)
CHLORIDE: 99 mmol/L — AB (ref 101–111)
CO2: 29 mmol/L (ref 22–32)
Calcium: 9.3 mg/dL (ref 8.9–10.3)
Creatinine, Ser: 1.02 mg/dL — ABNORMAL HIGH (ref 0.44–1.00)
GFR calc non Af Amer: 50 mL/min — ABNORMAL LOW (ref >60)
GFR, EST AFRICAN AMERICAN: 58 mL/min — AB (ref >60)
Glucose, Bld: 125 mg/dL — ABNORMAL HIGH (ref 65–99)
POTASSIUM: 3.1 mmol/L — AB (ref 3.5–5.1)
SODIUM: 139 mmol/L (ref 135–145)

## 2015-12-22 LAB — MAGNESIUM: MAGNESIUM: 1.8 mg/dL (ref 1.7–2.4)

## 2015-12-22 LAB — TROPONIN I
TROPONIN I: 0.05 ng/mL — AB (ref <0.03)
Troponin I: 0.06 ng/mL (ref <0.03)
Troponin I: 0.06 ng/mL (ref <0.03)

## 2015-12-22 MED ORDER — ACETAMINOPHEN 650 MG RE SUPP: 650.0000 mg | Freq: Four times a day (QID) | RECTAL | Status: DC | PRN

## 2015-12-22 MED ORDER — GABAPENTIN 400 MG PO CAPS
800.0000 mg | ORAL_CAPSULE | Freq: Three times a day (TID) | ORAL | Status: DC
  Administered 2015-12-22 – 2015-12-27 (×16): 800 mg via ORAL
  Filled 2015-12-22 (×12): qty 2
  Filled 2015-12-22: qty 8
  Filled 2015-12-22 (×4): qty 2

## 2015-12-22 MED ORDER — GUAIFENESIN ER 600 MG PO TB12
600.0000 mg | ORAL_TABLET | Freq: Two times a day (BID) | ORAL | Status: DC
  Administered 2015-12-22 – 2016-01-01 (×20): 600 mg via ORAL
  Filled 2015-12-22 (×20): qty 1

## 2015-12-22 MED ORDER — POTASSIUM CHLORIDE CRYS ER 20 MEQ PO TBCR: 40.0000 meq | EXTENDED_RELEASE_TABLET | Freq: Two times a day (BID) | ORAL | Status: DC

## 2015-12-22 MED ORDER — FUROSEMIDE 40 MG PO TABS
80.0000 mg | ORAL_TABLET | Freq: Every day | ORAL | Status: DC
  Administered 2015-12-22 – 2015-12-23 (×2): 80 mg via ORAL
  Filled 2015-12-22 (×2): qty 2

## 2015-12-22 MED ORDER — ALUM & MAG HYDROXIDE-SIMETH 200-200-20 MG/5ML PO SUSP
15.0000 mL | ORAL | Status: DC | PRN
  Filled 2015-12-22: qty 30

## 2015-12-22 MED ORDER — ALBUTEROL SULFATE (2.5 MG/3ML) 0.083% IN NEBU: 2.5000 mg | INHALATION_SOLUTION | RESPIRATORY_TRACT | Status: DC | PRN

## 2015-12-22 MED ORDER — CARVEDILOL 3.125 MG PO TABS
3.1250 mg | ORAL_TABLET | Freq: Two times a day (BID) | ORAL | Status: DC
  Administered 2015-12-22 – 2015-12-23 (×4): 3.125 mg via ORAL
  Filled 2015-12-22 (×4): qty 1

## 2015-12-22 MED ORDER — PNEUMOCOCCAL VAC POLYVALENT 25 MCG/0.5ML IJ INJ
0.5000 mL | INJECTION | INTRAMUSCULAR | Status: AC
  Administered 2015-12-23: 0.5 mL via INTRAMUSCULAR
  Filled 2015-12-22: qty 0.5

## 2015-12-22 MED ORDER — FUROSEMIDE 40 MG PO TABS
40.0000 mg | ORAL_TABLET | Freq: Every day | ORAL | Status: DC
  Administered 2015-12-22: 40 mg via ORAL
  Filled 2015-12-22: qty 1

## 2015-12-22 MED ORDER — POTASSIUM CHLORIDE CRYS ER 20 MEQ PO TBCR
40.0000 meq | EXTENDED_RELEASE_TABLET | ORAL | Status: AC
Start: 2015-12-22 — End: 2015-12-22
  Administered 2015-12-22 (×2): 40 meq via ORAL
  Filled 2015-12-22 (×2): qty 2

## 2015-12-22 MED ORDER — NORTRIPTYLINE HCL 25 MG PO CAPS
25.0000 mg | ORAL_CAPSULE | Freq: Every day | ORAL | Status: DC
  Administered 2015-12-22 – 2015-12-31 (×11): 25 mg via ORAL
  Filled 2015-12-22 (×12): qty 1

## 2015-12-22 MED ORDER — ACETAMINOPHEN 325 MG PO TABS
650.0000 mg | ORAL_TABLET | Freq: Four times a day (QID) | ORAL | Status: DC | PRN
  Administered 2015-12-22 – 2015-12-23 (×3): 650 mg via ORAL
  Filled 2015-12-22 (×3): qty 2

## 2015-12-22 NOTE — Progress Notes (Addendum)
CMT called Bianca [name] and Bianca [name] informed me that strip stated 37 beats of VTach at the time I was with pt and she was asymptomatic. Strip was evaluated and it looked like an bundle branch block. Stip is in EPIC  Will continue to monitor. Arthor Captain LPN

## 2015-12-22 NOTE — Progress Notes (Addendum)
Pt is having frequent urination of foul smelling urine voids very small amount post void residual is 15ml volume by bladder scan not sure of accuracy due to body size [facility] notified and a request for UA awaiting the order. Will continue to monitor. Arthor Captain LPN

## 2015-12-22 NOTE — Clinical Social Work Note (Addendum)
CSW contacted admissions coordinator this morning about patient. Patient reportedly discharged from [facility] yesterday. RN reports that patient is ambulatory in room.  Dayton Scrape, [place] [phone]  10:06 am Patient will discharge back to [place]. Her daughter-in-law will transport. RNCM aware.  CSW signing off.  Dayton Scrape, [place]

## 2015-12-22 NOTE — Progress Notes (Signed)
[facility] Progress Note  Patient: Bianca [name] [id number]   PCP: Jonathon Bellows, MD DOB: June 29, 1932   DOA: 12/21/2015   DOS: 12/22/2015   Date of Service: the patient was seen and examined on 12/22/2015  Subjective: patient denies any acute chest pain at the time of my evaluation. No nausea no vomiting. No shortness of breath. Nutrition: tolerating oral diet  Brief hospital course: Pt. with PMH of ASCVD, OSA, security 3, chronic combined CHF mild HTN; admitted on 12/21/2015, with complaint of shortness of breath as well as recurrent palpitation, was found to have PVC as well as mild CHF exacerbation. Currently further plan is continue diuresis as well as monitor on telemetry.  Assessment and Plan: 1. Dyspnea:  Etiology here unclear.might be associated with PVC. Resume Coreg. Possibility of VT VF is less likely as recent ICD interrogation did not show any evidence. No evidence of PE as well. No pneumonia. Wonder if some of her symptoms are from progression of her sleep apnea and CO2 retention, and I do not see that she has regular follow up with Pulm -Tele overnight  2. Hypokalemia:  -Check magnesium -Continue supplement -Repeat BMP  3. Chronic diastolic CHF:  Last EF 99991111, previously lower.  given single extra dose of IV furosemide then resume home furosemide, check BMET in 1 week with [place] arranged by Cardiology -Hold statin and colestipol while OBS status  4. Neuropathy:  -Continue gabapentin and nortriptyline  5. pAF:  CHADS2Vasc 6. Not on anticoagulation since hematoma, but likely will start Xarelto when cleared by surgery soon.  6. OSA on CPAP:  -Continue CPAP therapy  7. CKD III:  Stable, not previously on problem list  Pain management: when necessary Tylenol Activity: consulted physical therapy Bowel regimen: last BM 12/22/2015 Diet: cardiac diet DVT Prophylaxis: subcutaneous Heparin  Advance goals of care discussion: full  Family  Communication: no family was present at bedside, at the time of interview.   Disposition:  Discharge to home probably home health. Expected discharge date: 12/23/2015, improvement in oxygenation  Consultants: none Procedures: none  Antibiotics: Anti-infectives    None        Intake/Output Summary (Last 24 hours) at 12/22/15 1818 Last data filed at 12/22/15 1803  Gross per 24 hour  Intake    875 ml  Output   1625 ml  Net   -750 ml   Filed Weights   12/21/15 1827 12/22/15 0053  Weight: 107.956 kg (238 lb) 105.96 kg (233 lb 9.6 oz)    Objective: Physical Exam: Filed Vitals:   12/22/15 0053 12/22/15 0323 12/22/15 1046 12/22/15 1546  BP: 117/74 123/62 114/69   Pulse: 82 102 106   Temp: 97.5 F (36.4 C) 98.6 F (37 C)  97.5 F (36.4 C)  TempSrc: Oral Oral  Oral  Resp: 20 20 20    Height: 5\' 6"  (1.676 m)     Weight: 105.96 kg (233 lb 9.6 oz)     SpO2: 91% 92% 95%     General: Alert, Awake and Oriented to Time, Place and Person. Appear in mild distress Eyes: PERRL, Conjunctiva normal ENT: Oral Mucosa clear moist. Neck: no JVD, no Abnormal Mass Or lumps Cardiovascular: S1 and S2 Present, aortic systolic Murmur, Respiratory: Bilateral Air entry equal and Decreased, Clear to Auscultation, no Crackles, no wheezes Abdomen: Bowel Sound persent, Soft and no tenderness Skin: no redness, no Rash  Extremities: no Pedal edema, no calf tenderness Neurologic: Grossly no focal neuro deficit. Bilaterally Equal motor strength  Data Reviewed: CBC:  Recent Labs Lab 12/21/15 1829  WBC 7.0  HGB 12.5  HCT 40.5  MCV 95.1  PLT [id number]   Basic Metabolic Panel:  Recent Labs Lab 12/21/15 1829 12/22/15 0727  NA 138 139  K 3.0* 3.1*  CL 97* 99*  CO2 30 29  GLUCOSE 121* 125*  BUN 18 15  CREATININE 1.17* 1.02*  CALCIUM 9.5 9.3  MG  --  1.8    Liver Function Tests: No results for input(s): AST, ALT, ALKPHOS, BILITOT, PROT, ALBUMIN in the last 168 hours. No results for  input(s): LIPASE, AMYLASE in the last 168 hours. No results for input(s): AMMONIA in the last 168 hours. Coagulation Profile: No results for input(s): INR, PROTIME in the last 168 hours. Cardiac Enzymes:  Recent Labs Lab 12/22/15 0727 12/22/15 1241  TROPONINI 0.06* 0.06*   BNP (last 3 results) No results for input(s): PROBNP in the last 8760 hours.  CBG: No results for input(s): GLUCAP in the last 168 hours.  Studies: Dg Chest 2 View  12/21/2015  CLINICAL DATA:  Shortness of breath off and on for 1 month. EXAM: CHEST  2 VIEW COMPARISON:  11/24/2015 FINDINGS: Low lung volumes on this AP and lateral exam. The cardio pericardial silhouette is enlarged. No focal airspace consolidation, pulmonary edema, or pleural effusion. Left-sided pacer/ AICD remains in place. The visualized bony structures of the thorax are intact. IMPRESSION: Low volume film without acute cardiopulmonary findings. Electronically Signed   By: Misty Stanley M.D.   On: 12/21/2015 19:26     Scheduled Meds: . carvedilol  3.125 mg Oral BID WC  . furosemide  40 mg Oral QPC supper  . furosemide  80 mg Oral Daily  . gabapentin  800 mg Oral TID  . guaiFENesin  600 mg Oral BID  . nortriptyline  25 mg Oral QHS  . [START ON 12/23/2015] pneumococcal 23 valent vaccine  0.5 mL Intramuscular Tomorrow-1000   Continuous Infusions:  PRN Meds: acetaminophen **OR** acetaminophen, albuterol  Time spent: 30 minutes  Author: Berle Mull, MD [facility] Pager: [phone] 12/22/2015 6:18 PM  If 7PM-7AM, please contact night-coverage at [email or web address], password [email or web address]

## 2015-12-22 NOTE — Progress Notes (Signed)
2nd Troponin (+) = 0.06.  Physician aware.

## 2015-12-22 NOTE — Progress Notes (Signed)
Lab reported critical lab value for Troponin 0.06.  Reported to physician and is aware.

## 2015-12-22 NOTE — Care Management Note (Signed)
Case Management Note  Patient Details  Name: Bianca [name] MRN: [id number] Date of Birth: [birth year]  Subjective/Objective:         Admitted with Hypoxemia           Action/Plan: Patient was recently at [facility] and was recently  discharged to an [facility]; pt stated " I don't know if I will be able to go back." Awaiting for PT/OT evals to determine discharge needs.  Expected Discharge Date:  12/24/15               Expected Discharge Plan:  [place]  Discharge planning Services  CM Consult Choice offered to:  Patient Status of Service:  In process, will continue to follow  [name] [phone] 12/22/2015, 2:56 PM

## 2015-12-22 NOTE — Care Management Obs Status (Signed)
[facility] NOTIFICATION   Patient Details  Name: KAMAIYAH PICCIRILLI MRN: [id number] Date of Birth: 10/28/1932   Medicare Observation Status Notification Given:  Yes    Royston Bake, RN 12/22/2015, 2:55 PM

## 2015-12-22 NOTE — Progress Notes (Signed)
SATURATION QUALIFICATIONS: (This note is used to comply with regulatory documentation for home oxygen)  Patient Saturations on Room Air at Rest = 94% prior to ambulation;  80% sitting in chair post ambulation.    Patient Saturations on Room Air while Ambulating = 94%  Patient Saturations on 0 Liters of oxygen while Ambulating = 94%  Please briefly explain why patient needs home oxygen:  Pt O2 sats dropped to 80% post ambulation; placed on 2L O2.

## 2015-12-23 DIAGNOSIS — I480 Paroxysmal atrial fibrillation: Secondary | ICD-10-CM

## 2015-12-23 DIAGNOSIS — G608 Other hereditary and idiopathic neuropathies: Secondary | ICD-10-CM | POA: Diagnosis not present

## 2015-12-23 DIAGNOSIS — R0902 Hypoxemia: Secondary | ICD-10-CM | POA: Diagnosis not present

## 2015-12-23 DIAGNOSIS — R262 Difficulty in walking, not elsewhere classified: Secondary | ICD-10-CM | POA: Diagnosis not present

## 2015-12-23 DIAGNOSIS — I209 Angina pectoris, unspecified: Secondary | ICD-10-CM | POA: Diagnosis not present

## 2015-12-23 DIAGNOSIS — Z6838 Body mass index (BMI) 38.0-38.9, adult: Secondary | ICD-10-CM | POA: Diagnosis not present

## 2015-12-23 DIAGNOSIS — I2510 Atherosclerotic heart disease of native coronary artery without angina pectoris: Secondary | ICD-10-CM | POA: Diagnosis not present

## 2015-12-23 DIAGNOSIS — Z888 Allergy status to other drugs, medicaments and biological substances status: Secondary | ICD-10-CM | POA: Diagnosis not present

## 2015-12-23 DIAGNOSIS — R0602 Shortness of breath: Secondary | ICD-10-CM | POA: Diagnosis not present

## 2015-12-23 DIAGNOSIS — M6281 Muscle weakness (generalized): Secondary | ICD-10-CM | POA: Diagnosis not present

## 2015-12-23 DIAGNOSIS — Z7901 Long term (current) use of anticoagulants: Secondary | ICD-10-CM | POA: Diagnosis not present

## 2015-12-23 DIAGNOSIS — Z95810 Presence of automatic (implantable) cardiac defibrillator: Secondary | ICD-10-CM | POA: Diagnosis not present

## 2015-12-23 DIAGNOSIS — I509 Heart failure, unspecified: Secondary | ICD-10-CM | POA: Diagnosis not present

## 2015-12-23 DIAGNOSIS — R41841 Cognitive communication deficit: Secondary | ICD-10-CM | POA: Diagnosis not present

## 2015-12-23 DIAGNOSIS — G4733 Obstructive sleep apnea (adult) (pediatric): Secondary | ICD-10-CM | POA: Diagnosis not present

## 2015-12-23 DIAGNOSIS — I5032 Chronic diastolic (congestive) heart failure: Secondary | ICD-10-CM | POA: Diagnosis not present

## 2015-12-23 DIAGNOSIS — E669 Obesity, unspecified: Secondary | ICD-10-CM | POA: Diagnosis not present

## 2015-12-23 DIAGNOSIS — I130 Hypertensive heart and chronic kidney disease with heart failure and stage 1 through stage 4 chronic kidney disease, or unspecified chronic kidney disease: Secondary | ICD-10-CM | POA: Diagnosis not present

## 2015-12-23 DIAGNOSIS — F419 Anxiety disorder, unspecified: Secondary | ICD-10-CM | POA: Diagnosis not present

## 2015-12-23 DIAGNOSIS — I10 Essential (primary) hypertension: Secondary | ICD-10-CM | POA: Diagnosis not present

## 2015-12-23 DIAGNOSIS — I110 Hypertensive heart disease with heart failure: Secondary | ICD-10-CM | POA: Diagnosis not present

## 2015-12-23 DIAGNOSIS — I5033 Acute on chronic diastolic (congestive) heart failure: Secondary | ICD-10-CM

## 2015-12-23 DIAGNOSIS — K5900 Constipation, unspecified: Secondary | ICD-10-CM | POA: Diagnosis not present

## 2015-12-23 DIAGNOSIS — R0600 Dyspnea, unspecified: Secondary | ICD-10-CM | POA: Diagnosis not present

## 2015-12-23 DIAGNOSIS — E875 Hyperkalemia: Secondary | ICD-10-CM | POA: Diagnosis not present

## 2015-12-23 DIAGNOSIS — I472 Ventricular tachycardia: Secondary | ICD-10-CM | POA: Diagnosis not present

## 2015-12-23 DIAGNOSIS — F329 Major depressive disorder, single episode, unspecified: Secondary | ICD-10-CM | POA: Diagnosis not present

## 2015-12-23 DIAGNOSIS — Z87891 Personal history of nicotine dependence: Secondary | ICD-10-CM | POA: Diagnosis not present

## 2015-12-23 DIAGNOSIS — M069 Rheumatoid arthritis, unspecified: Secondary | ICD-10-CM | POA: Diagnosis not present

## 2015-12-23 DIAGNOSIS — Z8249 Family history of ischemic heart disease and other diseases of the circulatory system: Secondary | ICD-10-CM | POA: Diagnosis not present

## 2015-12-23 DIAGNOSIS — E569 Vitamin deficiency, unspecified: Secondary | ICD-10-CM | POA: Diagnosis not present

## 2015-12-23 DIAGNOSIS — E785 Hyperlipidemia, unspecified: Secondary | ICD-10-CM | POA: Diagnosis not present

## 2015-12-23 DIAGNOSIS — I5043 Acute on chronic combined systolic (congestive) and diastolic (congestive) heart failure: Secondary | ICD-10-CM | POA: Diagnosis not present

## 2015-12-23 DIAGNOSIS — I252 Old myocardial infarction: Secondary | ICD-10-CM | POA: Diagnosis not present

## 2015-12-23 DIAGNOSIS — R4182 Altered mental status, unspecified: Secondary | ICD-10-CM | POA: Diagnosis not present

## 2015-12-23 DIAGNOSIS — I429 Cardiomyopathy, unspecified: Secondary | ICD-10-CM | POA: Diagnosis not present

## 2015-12-23 DIAGNOSIS — T502X5A Adverse effect of carbonic-anhydrase inhibitors, benzothiadiazides and other diuretics, initial encounter: Secondary | ICD-10-CM | POA: Diagnosis not present

## 2015-12-23 DIAGNOSIS — I4891 Unspecified atrial fibrillation: Secondary | ICD-10-CM | POA: Diagnosis not present

## 2015-12-23 DIAGNOSIS — E039 Hypothyroidism, unspecified: Secondary | ICD-10-CM | POA: Diagnosis not present

## 2015-12-23 DIAGNOSIS — I5023 Acute on chronic systolic (congestive) heart failure: Secondary | ICD-10-CM | POA: Diagnosis not present

## 2015-12-23 DIAGNOSIS — R52 Pain, unspecified: Secondary | ICD-10-CM | POA: Diagnosis not present

## 2015-12-23 DIAGNOSIS — N390 Urinary tract infection, site not specified: Secondary | ICD-10-CM | POA: Diagnosis not present

## 2015-12-23 DIAGNOSIS — S301XXD Contusion of abdominal wall, subsequent encounter: Secondary | ICD-10-CM | POA: Diagnosis not present

## 2015-12-23 DIAGNOSIS — Z23 Encounter for immunization: Secondary | ICD-10-CM | POA: Diagnosis not present

## 2015-12-23 DIAGNOSIS — T148 Other injury of unspecified body region: Secondary | ICD-10-CM | POA: Diagnosis not present

## 2015-12-23 DIAGNOSIS — N179 Acute kidney failure, unspecified: Secondary | ICD-10-CM | POA: Diagnosis not present

## 2015-12-23 DIAGNOSIS — N183 Chronic kidney disease, stage 3 (moderate): Secondary | ICD-10-CM | POA: Diagnosis not present

## 2015-12-23 DIAGNOSIS — E876 Hypokalemia: Secondary | ICD-10-CM | POA: Diagnosis not present

## 2015-12-23 DIAGNOSIS — R2689 Other abnormalities of gait and mobility: Secondary | ICD-10-CM | POA: Diagnosis not present

## 2015-12-23 DIAGNOSIS — Z91048 Other nonmedicinal substance allergy status: Secondary | ICD-10-CM | POA: Diagnosis not present

## 2015-12-23 DIAGNOSIS — W1830XD Fall on same level, unspecified, subsequent encounter: Secondary | ICD-10-CM | POA: Diagnosis not present

## 2015-12-23 DIAGNOSIS — G629 Polyneuropathy, unspecified: Secondary | ICD-10-CM | POA: Diagnosis not present

## 2015-12-23 DIAGNOSIS — R05 Cough: Secondary | ICD-10-CM | POA: Diagnosis not present

## 2015-12-23 DIAGNOSIS — I493 Ventricular premature depolarization: Secondary | ICD-10-CM | POA: Diagnosis not present

## 2015-12-23 DIAGNOSIS — Z79899 Other long term (current) drug therapy: Secondary | ICD-10-CM | POA: Diagnosis not present

## 2015-12-23 DIAGNOSIS — Z9981 Dependence on supplemental oxygen: Secondary | ICD-10-CM | POA: Diagnosis not present

## 2015-12-23 DIAGNOSIS — R040 Epistaxis: Secondary | ICD-10-CM | POA: Diagnosis not present

## 2015-12-23 LAB — BASIC METABOLIC PANEL
Anion gap: 9 (ref 5–15)
BUN: 19 mg/dL (ref 6–20)
CALCIUM: 9.2 mg/dL (ref 8.9–10.3)
CO2: 30 mmol/L (ref 22–32)
CREATININE: 1.11 mg/dL — AB (ref 0.44–1.00)
Chloride: 99 mmol/L — ABNORMAL LOW (ref 101–111)
GFR calc non Af Amer: 45 mL/min — ABNORMAL LOW (ref >60)
GFR, EST AFRICAN AMERICAN: 53 mL/min — AB (ref >60)
Glucose, Bld: 130 mg/dL — ABNORMAL HIGH (ref 65–99)
Potassium: 4 mmol/L (ref 3.5–5.1)
SODIUM: 138 mmol/L (ref 135–145)

## 2015-12-23 LAB — MAGNESIUM: MAGNESIUM: 1.9 mg/dL (ref 1.7–2.4)

## 2015-12-23 LAB — TROPONIN I: TROPONIN I: 0.04 ng/mL — AB (ref <0.03)

## 2015-12-23 MED ORDER — CARVEDILOL 3.125 MG PO TABS
3.1250 mg | ORAL_TABLET | Freq: Two times a day (BID) | ORAL | Status: DC
  Administered 2015-12-24: 3.125 mg via ORAL
  Filled 2015-12-23: qty 1

## 2015-12-23 MED ORDER — POTASSIUM CHLORIDE CRYS ER 20 MEQ PO TBCR
20.0000 meq | EXTENDED_RELEASE_TABLET | Freq: Two times a day (BID) | ORAL | Status: DC
  Administered 2015-12-23 – 2015-12-24 (×2): 20 meq via ORAL
  Filled 2015-12-23 (×2): qty 1

## 2015-12-23 MED ORDER — FUROSEMIDE 10 MG/ML IJ SOLN
80.0000 mg | Freq: Two times a day (BID) | INTRAMUSCULAR | Status: DC
  Administered 2015-12-23 – 2015-12-24 (×2): 80 mg via INTRAVENOUS
  Filled 2015-12-23 (×2): qty 8

## 2015-12-23 MED ORDER — ACETAMINOPHEN 325 MG PO TABS
650.0000 mg | ORAL_TABLET | Freq: Three times a day (TID) | ORAL | Status: DC
  Administered 2015-12-23 – 2016-01-01 (×27): 650 mg via ORAL
  Filled 2015-12-23 (×28): qty 2

## 2015-12-23 MED ORDER — ALUM & MAG HYDROXIDE-SIMETH 200-200-20 MG/5ML PO SUSP
30.0000 mL | ORAL | Status: DC | PRN
  Administered 2015-12-23: 30 mL via ORAL

## 2015-12-23 NOTE — Progress Notes (Signed)
CPAP machine unavailable at this time. RT will set one up for patient when it becomes available.

## 2015-12-23 NOTE — Progress Notes (Signed)
Pt had 9 beat run of V-tach.  VSS.  No acute distress.  Grandville Silos, PA-C notified.

## 2015-12-23 NOTE — Progress Notes (Signed)
Notified Dr. Posey Pronto that pt refused the [facility]  Perf & Vent diagnostic test today.

## 2015-12-23 NOTE — Progress Notes (Signed)
[facility] Progress Note  Patient: Bianca [name] [id number]   PCP: Jonathon Bellows, MD DOB: [birth year]   DOA: 12/21/2015   DOS: 12/23/2015   Date of Service: the patient was seen and examined on 12/23/2015  Subjective: Patient and patient's daughter mentions that the patient is more shortness of breath and usual. Patient refused VQ scan as she cannot lie flat. Denies any chest pain. Complains of back pain. Also because of abdominal pain. Nutrition: tolerating oral diet minimal oral intake  Brief hospital course: Pt. with PMH of ASCVD, OSA, security 3, chronic combined CHF mild HTN; admitted on 12/21/2015, with complaint of shortness of breath as well as recurrent palpitation, was found to have PVC as well as mild CHF exacerbation. Currently further plan is continue diuresis as well as monitor on telemetry.  Assessment and Plan: 1. Acute on chronic diastolic CHF. Dyspnea Consulted cardiology, patient will be started on IV diuresis. We will continue to monitor the patient's volume as well as ins and outs. Resume Coreg. Possibility of VT VF is less likely as recent ICD interrogation did not show any evidence. No evidence of PE as well. She refused VQ scan No pneumonia.  2. Hypokalemia:  Continue replacement, maintained K more than 4 magnesium more than 2  3. Frequent PVCs. Continue Coreg.  4. Neuropathy:  Continue gabapentin and nortriptyline  5. pAF:  CHADS2Vasc 6. Not on anticoagulation since hematoma, but likely will start Xarelto when cleared by surgery soon.  6. OSA on CPAP:  -Continue CPAP therapy  7. CKD III:  Stable, not previously on problem list  8. Abdominal wall hematoma. Appears stable hemoglobin stable. We'll recheck in the morning. If hemoglobin trends downward as then we'll consult with surgery regarding the hematoma. Likely will resolve on its own.  Pain management: when necessary Tylenol Activity: consulted physical therapy Bowel regimen: last  BM 12/22/2015 Diet: cardiac diet DVT Prophylaxis: scd  Advance goals of care discussion: full  Family Communication: no family was present at bedside, at the time of interview.   Disposition:  Discharge to home probably home health. Expected discharge date: 12/25/2015, improvement in oxygenation  Consultants: Cardiology Procedures: none  Antibiotics: Anti-infectives    None        Intake/Output Summary (Last 24 hours) at 12/23/15 1918 Last data filed at 12/23/15 1750  Gross per 24 hour  Intake    790 ml  Output    600 ml  Net    190 ml   Filed Weights   12/21/15 1827 12/22/15 0053 12/23/15 0600  Weight: 107.956 kg (238 lb) 105.96 kg (233 lb 9.6 oz) 106.686 kg (235 lb 3.2 oz)    Objective: Physical Exam: Filed Vitals:   12/23/15 0600 12/23/15 0934 12/23/15 1400 12/23/15 1652  BP: 100/67 99/68 103/72 131/67  Pulse:  93 97 88  Temp: 98.1 F (36.7 C) 97.5 F (36.4 C) 97.7 F (36.5 C) 97 F (36.1 C)  TempSrc: Oral Oral Oral Oral  Resp: 20 20 20 18   Height:      Weight: 106.686 kg (235 lb 3.2 oz)     SpO2: 97% 96% 96% 97%    General: Alert, Awake and Oriented to Time, Place and Person. Appear in mild distress Eyes: PERRL, Conjunctiva normal ENT: Oral Mucosa clear moist. Neck: no JVD, no Abnormal Mass Or lumps Cardiovascular: S1 and S2 Present, aortic systolic Murmur, Respiratory: Bilateral Air entry equal and Decreased, Clear to Auscultation, no Crackles, no wheezes Abdomen: Bowel Sound persent, Soft  and no tenderness Skin: no redness, no Rash  Extremities: no Pedal edema, no calf tenderness Neurologic: Grossly no focal neuro deficit. Bilaterally Equal motor strength  Data Reviewed: CBC:  Recent Labs Lab 12/21/15 1829  WBC 7.0  HGB 12.5  HCT 40.5  MCV 95.1  PLT [id number]   Basic Metabolic Panel:  Recent Labs Lab 12/21/15 1829 12/22/15 0727 12/23/15 0125  NA 138 139 138  K 3.0* 3.1* 4.0  CL 97* 99* 99*  CO2 30 29 30   GLUCOSE 121* 125* 130*    BUN 18 15 19   CREATININE 1.17* 1.02* 1.11*  CALCIUM 9.5 9.3 9.2  MG  --  1.8 1.9    Liver Function Tests: No results for input(s): AST, ALT, ALKPHOS, BILITOT, PROT, ALBUMIN in the last 168 hours. No results for input(s): LIPASE, AMYLASE in the last 168 hours. No results for input(s): AMMONIA in the last 168 hours. Coagulation Profile: No results for input(s): INR, PROTIME in the last 168 hours. Cardiac Enzymes:  Recent Labs Lab 12/22/15 0727 12/22/15 1241 12/22/15 1843 12/23/15 0125  TROPONINI 0.06* 0.06* 0.05* 0.04*   BNP (last 3 results) No results for input(s): PROBNP in the last 8760 hours.  CBG: No results for input(s): GLUCAP in the last 168 hours.  Studies: No results found.   Scheduled Meds: . acetaminophen  650 mg Oral Q8H  . carvedilol  3.125 mg Oral BID WC  . furosemide  80 mg Intravenous BID  . gabapentin  800 mg Oral TID  . guaiFENesin  600 mg Oral BID  . nortriptyline  25 mg Oral QHS  . potassium chloride  20 mEq Oral BID   Continuous Infusions:  PRN Meds: albuterol, alum & mag hydroxide-simeth  Time spent: 30 minutes  Author: Berle Mull, MD [facility] Pager: [phone] 12/23/2015 7:18 PM  If 7PM-7AM, please contact night-coverage at [email or web address], password [email or web address]

## 2015-12-23 NOTE — Evaluation (Signed)
Physical Therapy Evaluation Patient Details Name: Bianca [name] MRN: [id number] DOB: 1932/12/12 Today's Date: 12/23/2015   History of Present Illness  82 yo female with onset of SOB and hypoxemia with CO2 retention was cleared for mult pulm issues, decided she has CO2 retention with worsened sleep apnea.  PMHx:  neuropathy, CHF, CKD 3  Clinical Impression  Pt is up to walk with encouragement and noted her struggle related to balance, endurance and LE strength. Her plan is to ask for SNF stay to increase strength and stability so she can transition safely to IL or ALF.  Follow acutely for same goals and to increase her safety with lateral mobility as well.  Pt had sustained a backward and lateral shift of balance on last fall.    Follow Up Recommendations SNF    Equipment Recommendations  None recommended by PT    Recommendations for Other Services Rehab consult     Precautions / Restrictions Precautions Precautions: Fall (telemetry) Precaution Comments: falls at home, R flank hematoma Restrictions Weight Bearing Restrictions: No      Mobility  Bed Mobility Overal bed mobility: Needs Assistance Bed Mobility: Supine to Sit;Sit to Supine     Supine to sit: Min assist Sit to supine: Min assist   General bed mobility comments: using elevated HOB and rails to assist  Transfers Overall transfer level: Needs assistance Equipment used: Rolling walker (2 wheeled);1 person hand held assist Transfers: Sit to/from Stand Sit to Stand: Min assist;Mod assist         General transfer comment: reminders for hand placement  Ambulation/Gait Ambulation/Gait assistance: Min assist Ambulation Distance (Feet): 4 Feet Assistive device: Rolling walker (2 wheeled);1 person hand held assist Gait Pattern/deviations: Step-to pattern;Shuffle;Narrow base of support;Trunk flexed Gait velocity: reduced Gait velocity interpretation: Below normal speed for age/gender General Gait Details:  sidesteps bedside due to her limited abiltiy to walk from fatigue  Stairs            Wheelchair Mobility    Modified Rankin (Stroke Patients Only)       Balance                                             Pertinent Vitals/Pain Pain Assessment: Faces Faces Pain Scale: Hurts little more Pain Location: R side from bruising in last fall Pain Intervention(s): Monitored during session;Repositioned    Home Living Family/patient expects to be discharged to:: Assisted living Living Arrangements: Alone;Other (Comment) (night time help)             Home Equipment: Walker - 2 wheels;Other (comment) (2L O2 at night) Additional Comments: Pt was at [facility] for rehab with plan to discharge to ALF or at [facility], but was admitted to hospital     Prior Function Level of Independence: Independent with assistive device(s)         Comments: using RW since recent fall     Hand Dominance   Dominant Hand: Right    Extremity/Trunk Assessment   Upper Extremity Assessment: Generalized weakness           Lower Extremity Assessment: Generalized weakness      Cervical / Trunk Assessment: Kyphotic  Communication   Communication: HOH  Cognition Arousal/Alertness: Lethargic Behavior During Therapy: Flat affect Overall Cognitive Status: Within Functional Limits for tasks assessed  General Comments General comments (skin integrity, edema, etc.): Pt is up to step with encouragement and needs to be assisted for all standing and EOB sitting for now.  Planning to send to snf due to her weakness and limited safety with movement as hope is to get to IL or ALF    Exercises        Assessment/Plan    PT Assessment Patient needs continued PT services  PT Diagnosis Generalized weakness;Difficulty walking   PT Problem List Decreased strength;Decreased range of motion;Decreased activity tolerance;Decreased  balance;Decreased mobility;Decreased coordination;Decreased knowledge of use of DME;Decreased safety awareness;Cardiopulmonary status limiting activity;Obesity;Decreased skin integrity;Pain  PT Treatment Interventions DME instruction;Gait training;Functional mobility training;Therapeutic activities;Therapeutic exercise;Balance training;Neuromuscular re-education;Patient/family education   PT Goals (Current goals can be found in the Care Plan section) Acute Rehab PT Goals Patient Stated Goal: to get home safely in new environment PT Goal Formulation: With patient/family Time For Goal Achievement: 01/06/16 Potential to Achieve Goals: Good    Frequency Min 2X/week   Barriers to discharge Inaccessible home environment;Decreased caregiver support was home alone during the day prior to this admission    Co-evaluation               End of Session Equipment Utilized During Treatment: Oxygen Activity Tolerance: Patient limited by fatigue Patient left: in bed;with call bell/phone within reach;with bed alarm set;with family/visitor present Nurse Communication: Mobility status    Functional Assessment Tool Used: clinical judgement Functional Limitation: Mobility: Walking and moving around Mobility: Walking and Moving Around Current Status [id number]): At least 40 percent but less than 60 percent impaired, limited or restricted Mobility: Walking and Moving Around Goal Status [phone]): At least 1 percent but less than 20 percent impaired, limited or restricted    Time: 0856-0920 PT Time Calculation (min) (ACUTE ONLY): 24 min   Charges:   PT Evaluation $PT Eval Moderate Complexity: 1 Procedure PT Treatments $Gait Training: 8-22 mins   PT G Codes:   PT G-Codes **NOT FOR INPATIENT CLASS** Functional Assessment Tool Used: clinical judgement Functional Limitation: Mobility: Walking and moving around Mobility: Walking and Moving Around Current Status [id number]): At least 40 percent but less than  60 percent impaired, limited or restricted Mobility: Walking and Moving Around Goal Status [phone]): At least 1 percent but less than 20 percent impaired, limited or restricted    Ramond Dial 12/23/2015, 11:25 AM    Mee Hives, PT MS Acute Rehab Dept. Number: [place] and [place]

## 2015-12-23 NOTE — Consult Note (Addendum)
Advanced Heart Failure Team Consult Note  Referring Physician: Dr. Posey Pronto Primary Physician: Maurice Small, MD Primary Cardiologist: Dr Marlou Porch Primary HF: Dr. Haroldine Laws   Reason for Consultation: A/C diastolic CHF  HPI:    Bianca [name] is a 82 y.o. female with nonischemic cardiomyopathy, hx of atrial fibrillation-currently with chronic anticoagulation, hx of Chronic kidney disease-stage III, chronic diastolic heart failure, LVEF 50-55% 10/26/15 (receoverred from 20-25% in 11/2012), status post Bi- ventricular ICD-followed by Dr. Lovena Le.   Admitted 10/24/15 after fall and developing a flank hematoma. HF team consulted to help manage fluid overload while getting multiple blood products to reverse her INR. Got 4u FFP and 2 RBCs, initially. HF team continued to follow for fluid management, including several doses of IV lasix. Lasix increased to 40 mg daily po prior to d/c to SNF. Discharge weight 253 lbs. Echo in 5/17 with EF 50-55%  Admitted 6/8 -11/29/15 with acute onset SOB and CP. She required BMET and CXR significant for pulmonary edema. She diuresed 13 lbs. Discharge weight 235 lbs. Lasix increased to 40 mg po BID.   She was seen in HF clinic 12/21/15 and was relatively stable symptomatically, although she was concerned over her hematoma which has not improved since admission as above.  She had minimal ankle edema and clear lungs, although Optivol showed fluid index was up so Lasix was increased.   Pt had a "attack" of dyspnea the evening of 12/21/15 so family brought her into ED.  Pertinent labs on admission inculde K 3.0, Creatinine 1.17, BNP 950. CXR without edema. She was given 40 mg IV lasix in ED with good response, but then placed back on po diuretics on admission.   Felt bad after she got home from clinic Wednesday.  Drinks a lot at home for dry mouth. Tries to watch salt.  Takes all medicine as directed.  Feels a little better from admit, but still with orthopnea and SOB with  exertion.   Review of Systems: [y] = yes, [ ]  = no   General: Weight gain [y]; Weight loss [ ] ; Anorexia [ ] ; Fatigue [ ] ; Fever [ ] ; Chills [ ] ; Weakness [ ]   Cardiac: Chest pain/pressure [ ] ; Resting SOB [ ] ; Exertional SOB [y]; Orthopnea [y]; Pedal Edema [y]; Palpitations [ ] ; Syncope [ ] ; Presyncope [ ] ; Paroxysmal nocturnal dyspnea[ ]   Pulmonary: Cough [ ] ; Wheezing[ ] ; Hemoptysis[ ] ; Sputum [ ] ; Snoring [ ]   GI: Vomiting[ ] ; Dysphagia[ ] ; Melena[ ] ; Hematochezia [ ] ; Heartburn[ ] ; Abdominal pain [ ] ; Constipation [ ] ; Diarrhea [ ] ; BRBPR [ ]   GU: Hematuria[ ] ; Dysuria [ ] ; Nocturia[ ]   Vascular: Pain in legs with walking [ ] ; Pain in feet with lying flat [ ] ; Non-healing sores [ ] ; Stroke [ ] ; TIA [ ] ; Slurred speech [ ] ;  Neuro: Headaches[ ] ; Vertigo[ ] ; Seizures[ ] ; Paresthesias[ ] ;Blurred vision [ ] ; Diplopia [ ] ; Vision changes [ ]   Ortho/Skin: Arthritis [y]; Joint pain [y]; Muscle pain [ ] ; Joint swelling [ ] ; Back Pain [ ] ; Rash [ ]   Psych: Depression[ ] ; Anxiety[ ]   Heme: Bleeding problems [ ] ; Clotting disorders [ ] ; Anemia [ ]   Endocrine: Diabetes [ ] ; Thyroid dysfunction[ ]   Home Medications Prior to Admission medications   Medication Sig Start Date End Date Taking? Authorizing Provider  acetaminophen (TYLENOL) 325 MG tablet Take 650 mg by mouth 2 (two) times daily.   Yes Historical Provider, MD  allopurinol (ZYLOPRIM) 100 MG tablet  Take 100 mg by mouth daily. Takes along with a 300 mg tablet to equal 400 mg   Yes Historical Provider, MD  allopurinol (ZYLOPRIM) 300 MG tablet Take 300 mg by mouth daily. Take with the 100mg  tablet to equal 400mg  daily   Yes Historical Provider, MD  atorvastatin (LIPITOR) 10 MG tablet Take 10 mg by mouth daily at 6 PM.    Yes Historical Provider, MD  Calcium Carbonate-Vitamin D (CALCIUM 600+D) 600-400 MG-UNIT per tablet Take 1 tablet by mouth daily.    Yes Historical Provider, MD  Cholecalciferol (VITAMIN D3) 1000 UNITS CAPS Take 1 capsule by  mouth daily.   Yes Historical Provider, MD  colestipol (COLESTID) 1 G tablet Take 2 g by mouth at bedtime.    Yes Historical Provider, MD  furosemide (LASIX) 80 MG tablet Take 1 tab in AM and 1/2 tab in PM Patient taking differently: Take 40-80 mg by mouth 2 (two) times daily. Take 1 tab in AM and 1/2 tab in PM 12/21/15  Yes Jolaine Artist, MD  gabapentin (NEURONTIN) 800 MG tablet Take 800 mg by mouth 3 (three) times daily.   Yes Historical Provider, MD  guaiFENesin (MUCINEX) 600 MG 12 hr tablet Take 1 tablet (600 mg total) by mouth 2 (two) times daily. 11/29/15  Yes Theodis Blaze, MD  nitroGLYCERIN (NITROSTAT) 0.4 MG SL tablet Place 1 tablet (0.4 mg total) under the tongue every 5 (five) minutes as needed for chest pain. 11/24/12  Yes Jettie Booze, MD  nortriptyline (PAMELOR) 25 MG capsule Take 25 mg by mouth at bedtime.   Yes Historical Provider, MD  OXYGEN Inhale 2 L/min into the lungs at bedtime as needed (uses as bedtime everynight and then occasionally iun the daytime as needed for SOB).    Yes Historical Provider, MD  potassium chloride SA (K-DUR,KLOR-CON) 20 MEQ tablet Take 2 tablets (40 mEq total) by mouth 2 (two) times daily. 12/21/15  Yes Jolaine Artist, MD    Past Medical History: Past Medical History  Diagnosis Date  . Pacemaker   . ICD (implantable cardiac defibrillator) in place   . Myocardial infarction ([place]) 2008  . Sleep apnea   . Arthritis   . Neuromuscular disorder ([place])   . CHF (congestive heart failure) ([place])   . Hypertension   . Dysrhythmia   . Chronic systolic heart failure ([place])   . Acute systolic heart failure ([place])   . Acute blood loss anemia   . Depression 11/24/2015  . Physical deconditioning   . Hypokalemia   . Chronic combined systolic and diastolic heart failure ([place])   . A-fib ([place])   . Right flank hematoma   . CKD (chronic kidney disease), stage III 12/22/2015    Past Surgical History: Past Surgical History  Procedure Laterality Date  .  Cholecystectomy    . Abdominal hysterectomy    . Ankle surgery    . Insert / replace / remove pacemaker  2008  . Knee ligament reconstruction  2012  . Tonsillectomy    . Cardiac catheterization    . Eye surgery      Family History: Family History  Problem Relation Age of Onset  . Tuberculosis Father   . Stroke Mother   . Congestive Heart Failure Mother   . Diabetes Brother   . Stroke Brother   . Cancer Brother   . Heart attack Son     Social History: Social History   Social History  . Marital Status: Widowed  Spouse Name: N/A  . Number of Children: N/A  . Years of Education: N/A   Social History Main Topics  . Smoking status: Former Research scientist (life sciences)  . Smokeless tobacco: Former Systems developer  . Alcohol Use: No  . Drug Use: No  . Sexual Activity: Not Currently   Other Topics Concern  . None   Social History Narrative    Allergies:  Allergies  Allergen Reactions  . Quinapril Hcl Swelling    Tongue and throat  . Tape Other (See Comments)    Plastic tape causes irritation.   . Neosporin [Neomycin-Polymyxin-Gramicidin] Rash    Objective:    Vital Signs:   Temp:  [97.5 F (36.4 C)-99 F (37.2 C)] 97.5 F (36.4 C) (07/07 0934) Pulse Rate:  [93-99] 93 (07/07 0934) Resp:  [18-20] 20 (07/07 0934) BP: (97-108)/(62-72) 99/68 mmHg (07/07 0934) SpO2:  [96 %-99 %] 96 % (07/07 0934) Weight:  [235 lb 3.2 oz (106.686 kg)] 235 lb 3.2 oz (106.686 kg) (07/07 0600) Last BM Date: 12/22/15  Weight change: Filed Weights   12/21/15 1827 12/22/15 0053 12/23/15 0600  Weight: 238 lb (107.956 kg) 233 lb 9.6 oz (105.96 kg) 235 lb 3.2 oz (106.686 kg)    Intake/Output:   Intake/Output Summary (Last 24 hours) at 12/23/15 1332 Last data filed at 12/23/15 1023  Gross per 24 hour  Intake    715 ml  Output    600 ml  Net    115 ml     Physical Exam: General: Elderly appearing. NAD.  HEENT: normal Neck: supple. JVP to jaw,  Carotids 2+ bilat; no bruits. No thyromegaly or nodule noted.   Cor: PMI nondisplaced. RRR, no M/G/R Lungs: Mildly diminished basilar sounds. On 2 liters [place].  Abdomen: obese soft, ND. Large Right flank hematoma, still of significant size. no ecchymosis. No HSM. No bruits or masses. +BS Extremities: no cyanosis, clubbing, rash, trace ankle edema Neuro: alert & orientedx3, cranial nerves grossly intact. moves all 4 extremities w/o difficulty. Affect pleasant  Telemetry: A-sensed V-paced 90s  Labs: Basic Metabolic Panel:  Recent Labs Lab 12/21/15 1829 12/22/15 0727 12/23/15 0125  NA 138 139 138  K 3.0* 3.1* 4.0  CL 97* 99* 99*  CO2 30 29 30   GLUCOSE 121* 125* 130*  BUN 18 15 19   CREATININE 1.17* 1.02* 1.11*  CALCIUM 9.5 9.3 9.2  MG  --  1.8 1.9    Liver Function Tests: No results for input(s): AST, ALT, ALKPHOS, BILITOT, PROT, ALBUMIN in the last 168 hours. No results for input(s): LIPASE, AMYLASE in the last 168 hours. No results for input(s): AMMONIA in the last 168 hours.  CBC:  Recent Labs Lab 12/21/15 1829  WBC 7.0  HGB 12.5  HCT 40.5  MCV 95.1  PLT 247    Cardiac Enzymes:  Recent Labs Lab 12/22/15 0727 12/22/15 1241 12/22/15 1843 12/23/15 0125  TROPONINI 0.06* 0.06* 0.05* 0.04*    BNP: BNP (last 3 results)  Recent Labs  11/02/15 0736 11/24/15 0249 12/21/15 1839  BNP 143.9* 801.6* 948.0*    ProBNP (last 3 results) No results for input(s): PROBNP in the last 8760 hours.   CBG: No results for input(s): GLUCAP in the last 168 hours.  Coagulation Studies: No results for input(s): LABPROT, INR in the last 72 hours.  Other results: EKG: AsensedVpaced 98 bpm  Imaging: Dg Chest 2 View  12/21/2015  CLINICAL DATA:  Shortness of breath off and on for 1 month. EXAM: CHEST  2 VIEW COMPARISON:  11/24/2015 FINDINGS: Low lung volumes on this AP and lateral exam. The cardio pericardial silhouette is enlarged. No focal airspace consolidation, pulmonary edema, or pleural effusion. Left-sided pacer/ AICD remains  in place. The visualized bony structures of the thorax are intact. IMPRESSION: Low volume film without acute cardiopulmonary findings. Electronically Signed   By: Misty Stanley M.D.   On: 12/21/2015 19:26      Medications:     Current Medications: . acetaminophen  650 mg Oral Q8H  . carvedilol  3.125 mg Oral BID WC  . furosemide  40 mg Oral QPC supper  . furosemide  80 mg Oral Daily  . gabapentin  800 mg Oral TID  . guaiFENesin  600 mg Oral BID  . nortriptyline  25 mg Oral QHS     Infusions:      Assessment   1. NICM EF 35% s/p BiV ICD - [employer]. -> EF 50-55% on echo 10/2014 2. Paroxysmal AF - CHA2DS2-VASc 6 3. Morbid obesity with h/o CO2 retention  4. R Flank Hematoma  Plan    She does remain volume overloaded.  Will place back on 80 mg IV lasix BID starting tonight.  Suspect she needs at least 2 days further. Follow kidney function closely, should diurese until she is obviously dry.  Will likely need to switch to Torsemide 40 mg am 20 mg pm for home.   Supp K as needed  VQ scan pending.    Consider having Surgery to see while in house concerning her chronic hematoma.   Length of Stay:   Shirley Friar PA-C 12/23/2015, 1:32 PM  Advanced Heart Failure Team Pager [phone] (M-F; [place])  Please contact [facility] for night-coverage after hours (4p -7a ) and weekends on [email or web address]  Patient seen and examined with Oda Kilts, PA-C. We discussed all aspects of the encounter. I agree with the assessment and plan as stated above.   Still with volume overload and JVP to jaw. Would diurese with lasix 80 IV bid until creatinine bumps slightly and then change to po torsemide (was on oral lasix at home). Torsemide 40 am 20 pm. Supp K+  May be worth having surgery look at flank hematoma while in house. (It hasn't resolved yet). Will continue to hold anticoagulation.   Bensimhon, Daniel,MD 5:26 PM

## 2015-12-23 NOTE — Clinical Social Work Note (Addendum)
PT recommending SNF. Patient has used 63 SNF days so she will have a copay if she returns. CSW went to discuss with patient but she and her daughter were going down for x-ray.  Will come back later today.  Dayton Scrape, CSW [phone]  1:58 pm CSW went back to patient's room. Daughter said she was resting and asked CSW to come back later.  Dayton Scrape, [place]

## 2015-12-23 NOTE — Consult Note (Signed)
   [facility] CM Inpatient Consult   12/23/2015  Bianca [name] 1933-02-17 [id number]  Referral received to assess for care management services. 82 yo female with onset of SOB and hypoxemia with CO2 retention was cleared for mult pulm issues.Marland Kitchen PMHx: neuropathy, HF, CKD 3  Patient had just left [facility] prior to this admission to return to her Independent Living facility at [facility] per [place]. Explained to [name] (daughter)  regarding the benefits of [facility] services.  Explained that [place] [facility] is a covered benefit of [employer] insurance. Review information for [facility] Care Management and a brochure was provided with contact information.  Explained that [place] [facility] does not interfere with or replace any services arranged by the inpatient care management staff.  [name] expressed appreciation for information as she and the patient believes that she will no longer be able to live in her independent facility and they will seek for an Assisted Living facility after another potential SNF stay for rehab.  She states she will take the information and contact [place] [facility] if needs arises.  For questions or changes please contact:  Natividad Brood, RN BSN [facility] Liaison  [phone] business mobile phone Toll free office [phone]

## 2015-12-24 DIAGNOSIS — Z95810 Presence of automatic (implantable) cardiac defibrillator: Secondary | ICD-10-CM

## 2015-12-24 DIAGNOSIS — G4733 Obstructive sleep apnea (adult) (pediatric): Secondary | ICD-10-CM

## 2015-12-24 DIAGNOSIS — R0902 Hypoxemia: Secondary | ICD-10-CM

## 2015-12-24 DIAGNOSIS — I5033 Acute on chronic diastolic (congestive) heart failure: Secondary | ICD-10-CM | POA: Diagnosis present

## 2015-12-24 LAB — URINALYSIS, ROUTINE W REFLEX MICROSCOPIC
Bilirubin Urine: NEGATIVE
Glucose, UA: NEGATIVE mg/dL
Hgb urine dipstick: NEGATIVE
KETONES UR: NEGATIVE mg/dL
Nitrite: NEGATIVE
PH: 5 (ref 5.0–8.0)
PROTEIN: NEGATIVE mg/dL
SPECIFIC GRAVITY, URINE: 1.022 (ref 1.005–1.030)

## 2015-12-24 LAB — URINE MICROSCOPIC-ADD ON

## 2015-12-24 LAB — BASIC METABOLIC PANEL
Anion gap: 10 (ref 5–15)
BUN: 25 mg/dL — ABNORMAL HIGH (ref 6–20)
CALCIUM: 9.1 mg/dL (ref 8.9–10.3)
CO2: 28 mmol/L (ref 22–32)
CREATININE: 1.22 mg/dL — AB (ref 0.44–1.00)
Chloride: 98 mmol/L — ABNORMAL LOW (ref 101–111)
GFR, EST AFRICAN AMERICAN: 47 mL/min — AB (ref >60)
GFR, EST NON AFRICAN AMERICAN: 40 mL/min — AB (ref >60)
GLUCOSE: 126 mg/dL — AB (ref 65–99)
Potassium: 3.9 mmol/L (ref 3.5–5.1)
Sodium: 136 mmol/L (ref 135–145)

## 2015-12-24 LAB — CBC
HEMATOCRIT: 39 % (ref 36.0–46.0)
HEMOGLOBIN: 11.9 g/dL — AB (ref 12.0–15.0)
MCH: 29.2 pg (ref 26.0–34.0)
MCHC: 30.5 g/dL (ref 30.0–36.0)
MCV: 95.8 fL (ref 78.0–100.0)
Platelets: 232 10*3/uL (ref 150–400)
RBC: 4.07 MIL/uL (ref 3.87–5.11)
RDW: 16 % — AB (ref 11.5–15.5)
WBC: 9 10*3/uL (ref 4.0–10.5)

## 2015-12-24 LAB — MAGNESIUM: Magnesium: 2 mg/dL (ref 1.7–2.4)

## 2015-12-24 MED ORDER — DEXTROSE 5 % IV SOLN
1.0000 g | INTRAVENOUS | Status: DC
  Administered 2015-12-24 – 2015-12-29 (×6): 1 g via INTRAVENOUS
  Filled 2015-12-24 (×7): qty 10

## 2015-12-24 MED ORDER — SODIUM CHLORIDE 0.9 % IV SOLN
INTRAVENOUS | Status: DC
  Administered 2015-12-24: 18:00:00 via INTRAVENOUS

## 2015-12-24 NOTE — Progress Notes (Signed)
[facility] Progress Note  Patient: Bianca [name] [id number]   PCP: Jonathon Bellows, MD DOB: May 20, 1933   DOA: 12/21/2015   DOS: 12/24/2015   Date of Service: the patient was seen and examined on 12/24/2015  Subjective: Patient feels like she is going to pass out and she thinks that this is because of Coreg. Denies any chest pain complains of significant respiratory distress. No nausea no vomiting. Frequent PVCs on telemetry. Nutrition: tolerating oral diet minimal oral intake  Brief hospital course: Pt. with PMH of ASCVD, OSA, security 3, chronic combined CHF mild HTN; admitted on 12/21/2015, with complaint of shortness of breath as well as recurrent palpitation, was found to have PVC as well as mild CHF exacerbation. Currently further plan is continue diuresis as well as monitor on telemetry.  Assessment and Plan: 1. Acute on chronic diastolic CHF. Dyspnea Consulted cardiology, patient will be started on IV diuresis. Currently due to softer blood pressure Lasix is on hold We will continue to monitor the patient's volume as well as ins and outs. No evidence of PE as well. She refused VQ scan. No pneumonia. We will hold Coreg due to hypotension as well as patient's symptoms  2. Hypokalemia:  Continue replacement, maintained K more than 4 magnesium more than 2  3. Frequent PVCs. Holding Coreg due to patient's symptoms as well as hypotension  4. Neuropathy:  Continue gabapentin and nortriptyline  5. pAF:  CHADS2Vasc 6. Not on anticoagulation since hematoma, but likely will start Xarelto when cleared by surgery soon.  6. OSA on CPAP:  -Continue CPAP therapy  7. CKD III:  Stable, mild renal function worsening with BUN and creatinine elevation, Lasix discontinued and patient given IV fluid bolus.  8. Abdominal wall hematoma. Appears stable hemoglobin stable. H&H remains stable, it will likely resolve on its own.  Pain management: when necessary Tylenol Activity:  consulted physical therapy Bowel regimen: last BM 12/22/2015 Diet: cardiac diet DVT Prophylaxis: scd  Advance goals of care discussion: full  Family Communication: no family was present at bedside, at the time of interview.   Disposition:  Discharge to home probably home health. Expected discharge date: 12/25/2015, improvement in oxygenation  Consultants: Cardiology Procedures: none  Antibiotics: Anti-infectives    Start     Dose/Rate Route Frequency Ordered Stop   12/24/15 1500  cefTRIAXone (ROCEPHIN) 1 g in dextrose 5 % 50 mL IVPB     1 g 100 mL/hr over 30 Minutes Intravenous Every 24 hours 12/24/15 1341          Intake/Output Summary (Last 24 hours) at 12/24/15 1654 Last data filed at 12/24/15 1200  Gross per 24 hour  Intake    755 ml  Output    500 ml  Net    255 ml   Filed Weights   12/22/15 0053 12/23/15 0600 12/24/15 0618  Weight: 105.96 kg (233 lb 9.6 oz) 106.686 kg (235 lb 3.2 oz) 107.049 kg (236 lb)    Objective: Physical Exam: Filed Vitals:   12/23/15 2021 12/24/15 0618 12/24/15 0856 12/24/15 1026  BP: 104/68 97/63 108/64 98/63  Pulse: 80 90 91 88  Temp: 97.3 F (36.3 C) 99 F (37.2 C) 97.5 F (36.4 C) 97.5 F (36.4 C)  TempSrc: Oral Oral Oral Oral  Resp: 17 19  18   Height:      Weight:  107.049 kg (236 lb)    SpO2: 99% 99% 99% 98%    General: Alert, Awake and Oriented to Time, Place  and Person. Appear in mild distress Eyes: PERRL, Conjunctiva normal ENT: Oral Mucosa clear moist. Neck: no JVD, no Abnormal Mass Or lumps Cardiovascular: S1 and S2 Present, aortic systolic Murmur, Respiratory: Bilateral Air entry equal and Decreased, Clear to Auscultation, no Crackles, no wheezes Abdomen: Bowel Sound persent, Soft and no tenderness Skin: no redness, no Rash  Extremities: no Pedal edema, no calf tenderness Neurologic: Grossly no focal neuro deficit. Bilaterally Equal motor strength  Data Reviewed: CBC:  Recent Labs Lab 12/21/15 1829  12/24/15 0418  WBC 7.0 9.0  HGB 12.5 11.9*  HCT 40.5 39.0  MCV 95.1 95.8  PLT 247 [id number]   Basic Metabolic Panel:  Recent Labs Lab 12/21/15 1829 12/22/15 0727 12/23/15 0125 12/24/15 0418  NA 138 139 138 136  K 3.0* 3.1* 4.0 3.9  CL 97* 99* 99* 98*  CO2 30 29 30 28   GLUCOSE 121* 125* 130* 126*  BUN 18 15 19  25*  CREATININE 1.17* 1.02* 1.11* 1.22*  CALCIUM 9.5 9.3 9.2 9.1  MG  --  1.8 1.9 2.0    Liver Function Tests: No results for input(s): AST, ALT, ALKPHOS, BILITOT, PROT, ALBUMIN in the last 168 hours. No results for input(s): LIPASE, AMYLASE in the last 168 hours. No results for input(s): AMMONIA in the last 168 hours. Coagulation Profile: No results for input(s): INR, PROTIME in the last 168 hours. Cardiac Enzymes:  Recent Labs Lab 12/22/15 0727 12/22/15 1241 12/22/15 1843 12/23/15 0125  TROPONINI 0.06* 0.06* 0.05* 0.04*   BNP (last 3 results) No results for input(s): PROBNP in the last 8760 hours.  CBG: No results for input(s): GLUCAP in the last 168 hours.  Studies: No results found.   Scheduled Meds: . acetaminophen  650 mg Oral Q8H  . cefTRIAXone (ROCEPHIN)  IV  1 g Intravenous Q24H  . gabapentin  800 mg Oral TID  . guaiFENesin  600 mg Oral BID  . nortriptyline  25 mg Oral QHS   Continuous Infusions: . sodium chloride     PRN Meds: albuterol, alum & mag hydroxide-simeth  Time spent: 30 minutes  Author: Berle Mull, MD [facility] Pager: [phone] 12/24/2015 4:54 PM  If 7PM-7AM, please contact night-coverage at [email or web address], password [facility]

## 2015-12-24 NOTE — Progress Notes (Signed)
Patient c/o not feeling good, pt. Stated she feel like she about to expire denies cp and shortness of breath. V/S is stable. MD notified, MD came to see patient. Will continue to monitor patient. At 1203 pt. Have 6 beats of v.tach. Will notify MD

## 2015-12-24 NOTE — Progress Notes (Signed)
UOP dropping off per nurse. Bladder scan only 25cc. Per d/w Dr. Sallyanne Kuster she may be on dry side now - d/c Lasix and give 500cc fluid over the next few hours. Follow volume status. F/u BMET in AM. Coreg held - would avoid hypotension for now so as not to precipitate ATN. Lafern Brinkley PA-C

## 2015-12-24 NOTE — Progress Notes (Signed)
Patient Name: Bianca [name] Date of Encounter: 12/24/2015  Principal Problem:   Hypoxemia Active Problems:   HYPOKALEMIA   Chronic diastolic (congestive) heart failure (HCC)   Automatic implantable cardioverter-defibrillator in situ   Neuropathy involving both lower extremities (HCC)   Paroxysmal atrial fibrillation (HCC)   OSA (obstructive sleep apnea)   CKD (chronic kidney disease), stage III   Hypoxia   Length of Stay: 1  SUBJECTIVE  Feels "bad", but cannot be more specific. Seems tachypneic, lying almost fully horizontally. Feels worse after carvedilol started. Has not yet received diuretic due to infiltrated IV. Mediocre diuresis. Creatinine not much different.  Has pollakiuria and malodorous urine.  CURRENT MEDS . acetaminophen  650 mg Oral Q8H  . carvedilol  3.125 mg Oral BID WC  . furosemide  80 mg Intravenous BID  . gabapentin  800 mg Oral TID  . guaiFENesin  600 mg Oral BID  . nortriptyline  25 mg Oral QHS  . potassium chloride  20 mEq Oral BID    OBJECTIVE   Intake/Output Summary (Last 24 hours) at 12/24/15 1157 Last data filed at 12/24/15 0643  Gross per 24 hour  Intake    755 ml  Output    350 ml  Net    405 ml   Filed Weights   12/22/15 0053 12/23/15 0600 12/24/15 0618  Weight: 105.96 kg (233 lb 9.6 oz) 106.686 kg (235 lb 3.2 oz) 107.049 kg (236 lb)    PHYSICAL EXAM Filed Vitals:   12/23/15 2021 12/24/15 0618 12/24/15 0856 12/24/15 1026  BP: 104/68 97/63 108/64 98/63  Pulse: 80 90 91 88  Temp: 97.3 F (36.3 C) 99 F (37.2 C) 97.5 F (36.4 C) 97.5 F (36.4 C)  TempSrc: Oral Oral Oral Oral  Resp: 17 19  18   Height:      Weight:  107.049 kg (236 lb)    SpO2: 99% 99% 99% 98%   General: Alert, oriented x3, no distress. Hard of hearing. Severe obesity limits her exam. Head: no evidence of trauma, PERRL, EOMI, no exophtalmos or lid lag, no myxedema, no xanthelasma; normal ears, nose and oropharynx Neck: 8-9 cm elevation in jugular venous  pulsations and no hepatojugular reflux; brisk carotid pulses without delay and no carotid bruits Chest: clear to auscultation, no signs of consolidation by percussion or palpation, normal fremitus, symmetrical and full respiratory excursions Cardiovascular: normal position and quality of the apical impulse, regular rhythm, normal first and second heart sounds, no rubs or gallops, no murmur Abdomen: no tenderness or distention, no masses by palpation, no abnormal pulsatility or arterial bruits, normal bowel sounds, no hepatosplenomegaly Extremities: no clubbing, cyanosis or edema; 2+ radial, ulnar and brachial pulses bilaterally; 2+ right femoral, posterior tibial and dorsalis pedis pulses; 2+ left femoral, posterior tibial and dorsalis pedis pulses; no subclavian or femoral bruits Neurological: grossly nonfocal  LABS  CBC  Recent Labs  12/21/15 1829 12/24/15 0418  WBC 7.0 9.0  HGB 12.5 11.9*  HCT 40.5 39.0  MCV 95.1 95.8  PLT 247 [id number]   Basic Metabolic Panel  Recent Labs  12/23/15 0125 12/24/15 0418  NA 138 136  K 4.0 3.9  CL 99* 98*  CO2 30 28  GLUCOSE 130* 126*  BUN 19 25*  CREATININE 1.11* 1.22*  CALCIUM 9.2 9.1  MG 1.9 2.0   Liver Function Tests No results for input(s): AST, ALT, ALKPHOS, BILITOT, PROT, ALBUMIN in the last 72 hours. No results for input(s): LIPASE, AMYLASE in the last  72 hours. Cardiac Enzymes  Recent Labs  12/22/15 1241 12/22/15 1843 12/23/15 0125  TROPONINI 0.06* 0.05* 0.04*    Radiology Studies Imaging results have been reviewed and No results found.  TELE A sensed (sinus) BiV paced   ECG A sensed (sinus) BiV paced   ASSESSMENT AND PLAN  1. Acute on chronic diastolic heart failure (recovered LVEF after CRT) NICM EF 35% s/p BiV ICD - [employer]. -> EF 50-55% on echo 10/2014. Clinically hypervolemic. Multiple HF meds stopped after flank hemorrhage, now recently restarted on carvedilol, which could lead to transient decompensation. Needs  more diuresis, keep on IV diuretics. 2. Paroxysmal AF - CHA2DS2-VASc 6, off anticoagulation due to hematoma with very delayed resolution. 3. Morbid obesity with h/o CO2 retention - clearly contributing to dyspnea 4. R Flank Hematoma 5. Possible UTI - check UA and culture.   Sanda Klein, MD, [facility] [phone] office [phone] pager 12/24/2015 11:57 AM

## 2015-12-24 NOTE — Progress Notes (Signed)
Bladder scan done per order due to patient not diuresing  Well, only 25cc present in the bladder, MD notified. See manage order for IVF. Will continue to monitor patient.

## 2015-12-25 DIAGNOSIS — E038 Other specified hypothyroidism: Secondary | ICD-10-CM | POA: Diagnosis present

## 2015-12-25 DIAGNOSIS — E039 Hypothyroidism, unspecified: Secondary | ICD-10-CM | POA: Diagnosis present

## 2015-12-25 LAB — SEDIMENTATION RATE: Sed Rate: 19 mm/hr (ref 0–22)

## 2015-12-25 LAB — RENAL FUNCTION PANEL
ANION GAP: 9 (ref 5–15)
Albumin: 3.5 g/dL (ref 3.5–5.0)
BUN: 33 mg/dL — ABNORMAL HIGH (ref 6–20)
CHLORIDE: 97 mmol/L — AB (ref 101–111)
CO2: 28 mmol/L (ref 22–32)
Calcium: 9 mg/dL (ref 8.9–10.3)
Creatinine, Ser: 1.25 mg/dL — ABNORMAL HIGH (ref 0.44–1.00)
GFR calc non Af Amer: 39 mL/min — ABNORMAL LOW (ref >60)
GFR, EST AFRICAN AMERICAN: 45 mL/min — AB (ref >60)
Glucose, Bld: 168 mg/dL — ABNORMAL HIGH (ref 65–99)
Phosphorus: 4 mg/dL (ref 2.5–4.6)
Potassium: 3.9 mmol/L (ref 3.5–5.1)
Sodium: 134 mmol/L — ABNORMAL LOW (ref 135–145)

## 2015-12-25 LAB — CBC WITH DIFFERENTIAL/PLATELET
Basophils Absolute: 0 10*3/uL (ref 0.0–0.1)
Basophils Relative: 0 %
Eosinophils Absolute: 0.2 10*3/uL (ref 0.0–0.7)
Eosinophils Relative: 1 %
HCT: 39.6 % (ref 36.0–46.0)
HEMOGLOBIN: 12.4 g/dL (ref 12.0–15.0)
LYMPHS ABS: 2.9 10*3/uL (ref 0.7–4.0)
LYMPHS PCT: 27 %
MCH: 30 pg (ref 26.0–34.0)
MCHC: 31.3 g/dL (ref 30.0–36.0)
MCV: 95.7 fL (ref 78.0–100.0)
Monocytes Absolute: 1.1 10*3/uL — ABNORMAL HIGH (ref 0.1–1.0)
Monocytes Relative: 11 %
NEUTROS ABS: 6.3 10*3/uL (ref 1.7–7.7)
NEUTROS PCT: 61 %
Platelets: 276 10*3/uL (ref 150–400)
RBC: 4.14 MIL/uL (ref 3.87–5.11)
RDW: 16.1 % — ABNORMAL HIGH (ref 11.5–15.5)
WBC: 10.4 10*3/uL (ref 4.0–10.5)

## 2015-12-25 LAB — BASIC METABOLIC PANEL
ANION GAP: 10 (ref 5–15)
BUN: 33 mg/dL — AB (ref 6–20)
CHLORIDE: 96 mmol/L — AB (ref 101–111)
CO2: 28 mmol/L (ref 22–32)
Calcium: 8.9 mg/dL (ref 8.9–10.3)
Creatinine, Ser: 1.25 mg/dL — ABNORMAL HIGH (ref 0.44–1.00)
GFR calc Af Amer: 45 mL/min — ABNORMAL LOW (ref >60)
GFR, EST NON AFRICAN AMERICAN: 39 mL/min — AB (ref >60)
GLUCOSE: 169 mg/dL — AB (ref 65–99)
POTASSIUM: 3.9 mmol/L (ref 3.5–5.1)
Sodium: 134 mmol/L — ABNORMAL LOW (ref 135–145)

## 2015-12-25 LAB — URINE CULTURE

## 2015-12-25 LAB — VITAMIN B12: Vitamin B-12: 544 pg/mL (ref 180–914)

## 2015-12-25 LAB — TSH: TSH: 6.624 u[IU]/mL — AB (ref 0.350–4.500)

## 2015-12-25 LAB — T4, FREE: Free T4: 1.12 ng/dL (ref 0.61–1.12)

## 2015-12-25 LAB — FOLATE: Folate: 28.3 ng/mL (ref >5.9)

## 2015-12-25 LAB — MAGNESIUM: Magnesium: 2.1 mg/dL (ref 1.7–2.4)

## 2015-12-25 MED ORDER — FUROSEMIDE 10 MG/ML IJ SOLN
80.0000 mg | Freq: Once | INTRAMUSCULAR | Status: AC
  Administered 2015-12-25: 80 mg via INTRAVENOUS
  Filled 2015-12-25: qty 8

## 2015-12-25 MED ORDER — SENNOSIDES-DOCUSATE SODIUM 8.6-50 MG PO TABS
1.0000 | ORAL_TABLET | Freq: Two times a day (BID) | ORAL | Status: DC
  Administered 2015-12-25 – 2016-01-01 (×12): 1 via ORAL
  Filled 2015-12-25 (×13): qty 1

## 2015-12-25 MED ORDER — POLYETHYLENE GLYCOL 3350 17 G PO PACK
17.0000 g | PACK | Freq: Every day | ORAL | Status: DC
  Administered 2015-12-26 – 2015-12-29 (×2): 17 g via ORAL
  Filled 2015-12-25 (×5): qty 1

## 2015-12-25 MED ORDER — LIDOCAINE 5 % EX PTCH
1.0000 | MEDICATED_PATCH | CUTANEOUS | Status: DC
  Administered 2015-12-25 – 2015-12-29 (×5): 1 via TRANSDERMAL
  Filled 2015-12-25 (×8): qty 1

## 2015-12-25 NOTE — Progress Notes (Addendum)
Dr. Sallyanne Kuster asked me to request gen surg consult for flank hematoma. He spoke with Dr. Donne Hazel who will see the patient. Jacie Tristan PA-C

## 2015-12-25 NOTE — Progress Notes (Signed)
Patient Name: Bianca [name] Date of Encounter: 12/25/2015  Principal Problem:   Hypoxemia Active Problems:   HYPOKALEMIA   Chronic diastolic (congestive) heart failure (HCC)   Automatic implantable cardioverter-defibrillator in situ   Neuropathy involving both lower extremities (HCC)   Paroxysmal atrial fibrillation (HCC)   OSA (obstructive sleep apnea)   CKD (chronic kidney disease), stage III   Hypoxia   Acute on chronic diastolic congestive heart failure ([place])   Length of Stay: 2  SUBJECTIVE  Seems more alert today, but is still orthopneic and has dyspnea just using bedside commode.  CURRENT MEDS . acetaminophen  650 mg Oral Q8H  . cefTRIAXone (ROCEPHIN)  IV  1 g Intravenous Q24H  . gabapentin  800 mg Oral TID  . guaiFENesin  600 mg Oral BID  . lidocaine  1 patch Transdermal Q24H  . nortriptyline  25 mg Oral QHS    OBJECTIVE   Intake/Output Summary (Last 24 hours) at 12/25/15 1117 Last data filed at 12/25/15 0902  Gross per 24 hour  Intake    700 ml  Output    600 ml  Net    100 ml   Filed Weights   12/23/15 0600 12/24/15 0618 12/25/15 0332  Weight: 106.686 kg (235 lb 3.2 oz) 107.049 kg (236 lb) 107.593 kg (237 lb 3.2 oz)    PHYSICAL EXAM Filed Vitals:   12/24/15 0856 12/24/15 1026 12/24/15 2031 12/25/15 0332  BP: 108/64 98/63 102/67 94/67  Pulse: 91 88 87 74  Temp: 97.5 F (36.4 C) 97.5 F (36.4 C) 97.4 F (36.3 C) 97.8 F (36.6 C)  TempSrc: Oral Oral Oral Oral  Resp:  18 18   Height:      Weight:    107.593 kg (237 lb 3.2 oz)  SpO2: 99% 98% 99% 95%   General: Alert, oriented x3, no distress Head: no evidence of trauma, PERRL, EOMI, no exophtalmos or lid lag, no myxedema, no xanthelasma; normal ears, nose and oropharynx Neck: hard to see, but probably elevated jugular venous pulsations ; brisk carotid pulses without delay and no carotid bruits Chest: reduced breath sounds and a few basilar crackles bilaterally,  symmetrical and full respiratory  excursions Cardiovascular: normal position and quality of the apical impulse, regular rhythm, normal first and second heart sounds, no rubs or gallops, no murmur Abdomen: no tenderness or distention, no masses by palpation, no abnormal pulsatility or arterial bruits, normal bowel sounds, no hepatosplenomegaly Extremities: no clubbing, cyanosis, mild ankle edema;  Neurological: grossly nonfocal  LABS  CBC  Recent Labs  12/24/15 0418  WBC 9.0  HGB 11.9*  HCT 39.0  MCV 95.8  PLT [id number]   Basic Metabolic Panel  Recent Labs  12/24/15 0418 12/25/15 0906  NA 136 134*  134*  K 3.9 3.9  3.9  CL 98* 96*  97*  CO2 28 28  28   GLUCOSE 126* 169*  168*  BUN 25* 33*  33*  CREATININE 1.22* 1.25*  1.25*  CALCIUM 9.1 8.9  9.0  MG 2.0 2.1  PHOS  --  4.0   Liver Function Tests  Recent Labs  12/25/15 0906  ALBUMIN 3.5   No results for input(s): LIPASE, AMYLASE in the last 72 hours. Cardiac Enzymes  Recent Labs  12/22/15 1241 12/22/15 1843 12/23/15 0125  TROPONINI 0.06* 0.05* 0.04*    Radiology Studies Imaging results have been reviewed and No results found.  TELE A sensed V paced   ASSESSMENT AND PLAN  1. Acute on  chronic diastolic heart failure (recovered LVEF after CRT) NICM EF 35% s/p BiV ICD - [employer]. -> EF 50-55% on echo 10/2014. Clinically hypervolemic. Multiple HF meds stopped after flank hemorrhage, now recently restarted on carvedilol, which could lead to transient decompensation.  Try to restart IV diuretics. 2. Paroxysmal AF - CHA2DS2-VASc 6, off anticoagulation due to hematoma with very delayed resolution. 3. Morbid obesity with h/o CO2 retention - clearly contributing to dyspnea 4. R Flank Hematoma 5.  UTI - on antibiotics, contaminated culture.   Sanda Klein, MD, [facility] [phone] office [phone] pager 12/25/2015 11:17 AM

## 2015-12-25 NOTE — Progress Notes (Signed)
RT placed patient on Home unit CPAP. NO O2 bleed in needed. Patient tolerating well at this time.

## 2015-12-25 NOTE — Progress Notes (Signed)
Patient ID: Bianca [name], female   DOB: 09/13/1932, 82 y.o.   MRN: [id number] 83 yof admitted for heart failure, was admitted in may for flank hematoma after fall.  No more bleeding, just has organized right flank/back hematoma.  There is no role for any surgery.  I discussed this with patient but her family was not present when I went to see her today. She doesn't need to follow up in our office for this

## 2015-12-25 NOTE — Progress Notes (Signed)
[facility] Progress Note  Patient: Bianca [name] [id number]   PCP: Jonathon Bellows, MD DOB: 1933-03-31   DOA: 12/21/2015   DOS: 12/25/2015   Date of Service: the patient was seen and examined on 12/25/2015  Subjective: The patient mentions she is not feeling good but unable to specify why and regarding what. She denies any dizziness or lightheadedness, chest pain or shortness of breath, nausea or vomiting. Her primary complaint is back pain. Shortness of breath is better. She has mild swelling of her legs. Also complains of urinary urgency. Nutrition: tolerating oral diet minimal oral intake  Brief hospital course: Pt. with PMH of ASCVD, OSA, security 3, chronic combined CHF mild HTN; admitted on 12/21/2015, with complaint of shortness of breath as well as recurrent palpitation, was found to have PVC as well as mild CHF exacerbation. Currently further plan is continue diuresis as well as monitor on telemetry.  Assessment and Plan: 1. Acute on chronic diastolic CHF. Dyspnea Consulted cardiology, patient was given IV diuresis with Lasix and later on due to softer blood pressure Lasix is on hold. Getting Lasix 80 mg 1. We will continue to monitor the patient's volume as well as ins and outs. No evidence of PE as well. She refused VQ scan. No pneumonia. We will hold Coreg due to hypotension as well as patient's symptoms  2. Hypokalemia:  Continue replacement, maintained K more than 4 magnesium more than 2  3. Frequent PVCs. Initially patient was resumed on Coreg, currently Holding Coreg due to patient's symptoms as well as hypotension  4. Neuropathy:  Continue gabapentin and nortriptyline  5. pAF:  CHADS2Vasc 6.  Not on anticoagulation since hematoma. Surgery evaluated the patient and recommended no requirement for outpatient follow-up.  6. OSA on CPAP:  -Continue CPAP therapy  7. CKD III:  Stable, continue to monitor  8. Abdominal wall hematoma. Appears stable  hemoglobin stable. H&H remains stable, it will likely resolve on its own.  9. Subclinical hypothyroidism. TSH is elevated but free T4 is normal. Patient complains of generalized fatigue. Based on this evaluation at present it is not recommended to start the patient on T4 replacement. Recommend to continue to monitor as an outpatient.  10. Generalized fatigue and sense of not feeling well. UTI ESR normal Y-17 normal folic acid normal hemoglobin stable no evidence of active infection. LFTs are normal. At present I do not have any acute etiology. Patient is being treated for UTI with IV ceftriaxone although I do not feel that the current treatment for the UTI will also make the patient feel better. This might be patient's new baseline. Physical therapy consulted.  Pain management: when necessary Tylenol Activity: consulted physical therapy Bowel regimen: last BM 12/24/2015 Diet: cardiac diet DVT Prophylaxis: scd  Advance goals of care discussion: full  Family Communication: family was present at bedside, at the time of interview.   Disposition:  Discharge to home probably home health. Expected discharge date: 12/26/2015, improvement in oxygenation  Consultants: Cardiology, general surgery Procedures: none  Antibiotics: Anti-infectives    Start     Dose/Rate Route Frequency Ordered Stop   12/24/15 1500  cefTRIAXone (ROCEPHIN) 1 g in dextrose 5 % 50 mL IVPB     1 g 100 mL/hr over 30 Minutes Intravenous Every 24 hours 12/24/15 1341          Intake/Output Summary (Last 24 hours) at 12/25/15 1741 Last data filed at 12/25/15 1619  Gross per 24 hour  Intake   1040  ml  Output   1050 ml  Net    -10 ml   Filed Weights   12/23/15 0600 12/24/15 0618 12/25/15 0332  Weight: 106.686 kg (235 lb 3.2 oz) 107.049 kg (236 lb) 107.593 kg (237 lb 3.2 oz)    Objective: Physical Exam: Filed Vitals:   12/24/15 1026 12/24/15 2031 12/25/15 0332 12/25/15 1213  BP: 98/63 102/67 94/67     Pulse: 88 87 74 95  Temp: 97.5 F (36.4 C) 97.4 F (36.3 C) 97.8 F (36.6 C) 98.2 F (36.8 C)  TempSrc: Oral Oral Oral Oral  Resp: 18 18    Height:      Weight:   107.593 kg (237 lb 3.2 oz)   SpO2: 98% 99% 95% 100%   General: Alert, Awake and Oriented to Time, Place and Person. Appear in mild distress Eyes: PERRL, Conjunctiva normal ENT: Oral Mucosa clear moist. Neck: no JVD, no Abnormal Mass Or lumps Cardiovascular: S1 and S2 Present, aortic systolic Murmur, Respiratory: Bilateral Air entry equal and Decreased, Clear to Auscultation, no Crackles, no wheezes Abdomen: Bowel Sound persent, Soft and no tenderness, No CVA tenderness Skin: no redness, no Rash  Extremities: no Pedal edema, no calf tenderness Neurologic: Grossly no focal neuro deficit. Bilaterally Equal motor strength  Data Reviewed: CBC:  Recent Labs Lab 12/21/15 1829 12/24/15 0418 12/25/15 1212  WBC 7.0 9.0 10.4  NEUTROABS  --   --  6.3  HGB 12.5 11.9* 12.4  HCT 40.5 39.0 39.6  MCV 95.1 95.8 95.7  PLT 247 232 250   Basic Metabolic Panel:  Recent Labs Lab 12/21/15 1829 12/22/15 0727 12/23/15 0125 12/24/15 0418 12/25/15 0906  NA 138 139 138 136 134*  134*  K 3.0* 3.1* 4.0 3.9 3.9  3.9  CL 97* 99* 99* 98* 96*  97*  CO2 _0 GLUCOSE 121* 125* 130* 126* 169*  168*  BUN _1 25* 33*  33*  CREATININE 1.17* 1.02* 1.11* 1.22* 1.25*  1.25*  CALCIUM 9.5 9.3 9.2 9.1 8.9  9.0  MG  --  1.8 1.9 2.0 2.1  PHOS  --   --   --   --  4.0    Liver Function Tests:  Recent Labs Lab 12/25/15 0906  ALBUMIN 3.5   No results for input(s): LIPASE, AMYLASE in the last 168 hours. No results for input(s): AMMONIA in the last 168 hours. Coagulation Profile: No results for input(s): INR, PROTIME in the last 168 hours. Cardiac Enzymes:  Recent Labs Lab 12/22/15 0727 12/22/15 1241 12/22/15 1843 12/23/15 0125  TROPONINI 0.06* 0.06* 0.05* 0.04*   BNP (last 3 results) No results for  input(s): PROBNP in the last 8760 hours.  CBG: No results for input(s): GLUCAP in the last 168 hours.  Studies: No results found.   Scheduled Meds: . acetaminophen  650 mg Oral Q8H  . cefTRIAXone (ROCEPHIN)  IV  1 g Intravenous Q24H  . gabapentin  800 mg Oral TID  . guaiFENesin  600 mg Oral BID  . lidocaine  1 patch Transdermal Q24H  . nortriptyline  25 mg Oral QHS   Continuous Infusions:   PRN Meds: albuterol, alum & mag hydroxide-simeth  Time spent: 30 minutes  Author: Berle Mull, MD [facility] Pager: [phone] 12/25/2015 5:41 PM  If 7PM-7AM, please contact night-coverage at [email or web address], password [facility]

## 2015-12-26 ENCOUNTER — Encounter: Payer: Self-pay | Admitting: Cardiology

## 2015-12-26 LAB — BASIC METABOLIC PANEL
ANION GAP: 11 (ref 5–15)
BUN: 31 mg/dL — ABNORMAL HIGH (ref 6–20)
CALCIUM: 9.1 mg/dL (ref 8.9–10.3)
CO2: 29 mmol/L (ref 22–32)
Chloride: 94 mmol/L — ABNORMAL LOW (ref 101–111)
Creatinine, Ser: 1.3 mg/dL — ABNORMAL HIGH (ref 0.44–1.00)
GFR, EST AFRICAN AMERICAN: 43 mL/min — AB (ref >60)
GFR, EST NON AFRICAN AMERICAN: 37 mL/min — AB (ref >60)
Glucose, Bld: 170 mg/dL — ABNORMAL HIGH (ref 65–99)
Potassium: 3.9 mmol/L (ref 3.5–5.1)
Sodium: 134 mmol/L — ABNORMAL LOW (ref 135–145)

## 2015-12-26 LAB — CBC
HCT: 40.1 % (ref 36.0–46.0)
HEMOGLOBIN: 12.6 g/dL (ref 12.0–15.0)
MCH: 29.8 pg (ref 26.0–34.0)
MCHC: 31.4 g/dL (ref 30.0–36.0)
MCV: 94.8 fL (ref 78.0–100.0)
Platelets: 271 10*3/uL (ref 150–400)
RBC: 4.23 MIL/uL (ref 3.87–5.11)
RDW: 16.3 % — ABNORMAL HIGH (ref 11.5–15.5)
WBC: 9.8 10*3/uL (ref 4.0–10.5)

## 2015-12-26 LAB — MAGNESIUM: MAGNESIUM: 2.6 mg/dL — AB (ref 1.7–2.4)

## 2015-12-26 MED ORDER — FUROSEMIDE 10 MG/ML IJ SOLN
80.0000 mg | Freq: Two times a day (BID) | INTRAMUSCULAR | Status: DC
  Administered 2015-12-26 – 2015-12-27 (×4): 80 mg via INTRAVENOUS
  Filled 2015-12-26 (×4): qty 8

## 2015-12-26 MED ORDER — POTASSIUM CHLORIDE CRYS ER 20 MEQ PO TBCR
20.0000 meq | EXTENDED_RELEASE_TABLET | Freq: Two times a day (BID) | ORAL | Status: DC
  Administered 2015-12-26 (×2): 20 meq via ORAL
  Filled 2015-12-26 (×2): qty 1

## 2015-12-26 MED ORDER — APIXABAN 5 MG PO TABS
5.0000 mg | ORAL_TABLET | Freq: Two times a day (BID) | ORAL | Status: DC
  Administered 2015-12-26 – 2016-01-01 (×13): 5 mg via ORAL
  Filled 2015-12-26 (×13): qty 1

## 2015-12-26 NOTE — Progress Notes (Signed)
[facility] Progress Note  Patient: Bianca [name] UKG:[id number]   PCP: Jonathon Bellows, MD DOB: [birth year]   DOA: 12/21/2015   DOS: 12/26/2015   Date of Service: the patient was seen and examined on 12/26/2015  Subjective: Denies any acute complaint. Feeling better. No shortness of breath. Nutrition: tolerating oral diet minimal oral intake  Brief hospital course: Pt. with PMH of ASCVD, OSA, security 3, chronic combined CHF mild HTN; admitted on 12/21/2015, with complaint of shortness of breath as well as recurrent palpitation, was found to have PVC as well as mild CHF exacerbation. Currently further plan is continue diuresis as well as monitor on telemetry.  Assessment and Plan: 1. Acute on chronic diastolic CHF. Dyspnea Consulted cardiology, patient was given IV diuresis with Lasix and later on due to softer blood pressure Lasix is on hold. Getting Lasix IV again. We will continue to monitor the patient's volume as well as ins and outs. No evidence of PE as well. She refused VQ scan. No pneumonia. We will hold Coreg due to hypotension as well as patient's symptoms  2. Hypokalemia:  Continue replacement, maintained K more than 4 magnesium more than 2  3. Frequent PVCs. Initially patient was resumed on Coreg, currently Holding Coreg due to patient's symptoms as well as hypotension  4. Neuropathy:  Continue gabapentin and nortriptyline  5. pAF:  CHADS2Vasc 6.  Not on anticoagulation since hematoma. Surgery evaluated the patient and recommended patient can be started back on anticoagulation. Pharmacy consulted.   6. OSA on CPAP:  -Continue CPAP therapy  7. CKD III:  Stable, continue to monitor  8. Abdominal wall hematoma. Appears stable hemoglobin stable. H&H remains stable, it will likely resolve on its own.  back on anti-coagulation. Per cardiology.  9. Subclinical hypothyroidism. TSH is elevated but free T4 is normal. Patient complains of generalized  fatigue. Based on this evaluation at present it is not recommended to start the patient on T4 replacement. Recommend to continue to monitor as an outpatient.  10. Generalized fatigue and sense of not feeling well. UTI ESR normal J-62 normal folic acid normal hemoglobin stable no evidence of active infection. LFTs are normal. At present I do not have any acute etiology. Patient is being treated for UTI with IV ceftriaxone although I do not feel that the current treatment for the UTI will also make the patient feel better. This might be patient's new baseline. Physical therapy consulted.  Pain management: when necessary Tylenol Activity: consulted physical therapy, recommend SNF Bowel regimen: last BM 12/24/2015 Diet: cardiac diet DVT Prophylaxis: scd  Advance goals of care discussion: full  Family Communication: family was present at bedside, at the time of interview.   Disposition:  Discharge to home probably home health. Expected discharge date: 12/28/2015, improvement in oxygenation  Consultants: Cardiology, general surgery Procedures: none  Antibiotics: Anti-infectives    Start     Dose/Rate Route Frequency Ordered Stop   12/24/15 1500  cefTRIAXone (ROCEPHIN) 1 g in dextrose 5 % 50 mL IVPB     1 g 100 mL/hr over 30 Minutes Intravenous Every 24 hours 12/24/15 1341          Intake/Output Summary (Last 24 hours) at 12/26/15 1900 Last data filed at 12/26/15 1754  Gross per 24 hour  Intake   1640 ml  Output   1471 ml  Net    169 ml   Filed Weights   12/24/15 0618 12/25/15 0332 12/26/15 0356  Weight: 107.049 kg (236 lb)  107.593 kg (237 lb 3.2 oz) 107.502 kg (237 lb)    Objective: Physical Exam: Filed Vitals:   12/25/15 2301 12/26/15 0356 12/26/15 0647 12/26/15 0945  BP:    117/76  Pulse: 90  87 90  Temp:   98.2 F (36.8 C) 97.8 F (36.6 C)  TempSrc:   Oral Oral  Resp: 18   18  Height:      Weight:  107.502 kg (237 lb)    SpO2: 96%   99%   General: Alert,  Awake and Oriented to Time, Place and Person. Appear in mild distress Eyes: PERRL, Conjunctiva normal ENT: Oral Mucosa clear moist. Neck: no JVD, no Abnormal Mass Or lumps Cardiovascular: S1 and S2 Present, aortic systolic Murmur, Respiratory: Bilateral Air entry equal and Decreased, Clear to Auscultation, no Crackles, no wheezes Abdomen: Bowel Sound persent, Soft and no tenderness, No CVA tenderness Skin: no redness, no Rash  Extremities: no Pedal edema, no calf tenderness Neurologic: Grossly no focal neuro deficit. Bilaterally Equal motor strength  Data Reviewed: CBC:  Recent Labs Lab 12/21/15 1829 12/24/15 0418 12/25/15 1212 12/26/15 0905  WBC 7.0 9.0 10.4 9.8  NEUTROABS  --   --  6.3  --   HGB 12.5 11.9* 12.4 12.6  HCT 40.5 39.0 39.6 40.1  MCV 95.1 95.8 95.7 94.8  PLT 247 232 276 100   Basic Metabolic Panel:  Recent Labs Lab 12/22/15 0727 12/23/15 0125 12/24/15 0418 12/25/15 0906 12/26/15 0905  NA 139 138 136 134*  134* 134*  K 3.1* 4.0 3.9 3.9  3.9 3.9  CL 99* 99* 98* 96*  97* 94*  CO2 29 30 28 28  28 29   GLUCOSE 125* 130* 126* 169*  168* 170*  BUN 15 19 25* 33*  33* 31*  CREATININE 1.02* 1.11* 1.22* 1.25*  1.25* 1.30*  CALCIUM 9.3 9.2 9.1 8.9  9.0 9.1  MG 1.8 1.9 2.0 2.1 2.6*  PHOS  --   --   --  4.0  --     Liver Function Tests:  Recent Labs Lab 12/25/15 0906  ALBUMIN 3.5   No results for input(s): LIPASE, AMYLASE in the last 168 hours. No results for input(s): AMMONIA in the last 168 hours. Coagulation Profile: No results for input(s): INR, PROTIME in the last 168 hours. Cardiac Enzymes:  Recent Labs Lab 12/22/15 0727 12/22/15 1241 12/22/15 1843 12/23/15 0125  TROPONINI 0.06* 0.06* 0.05* 0.04*   BNP (last 3 results) No results for input(s): PROBNP in the last 8760 hours.  CBG: No results for input(s): GLUCAP in the last 168 hours.  Studies: No results found.   Scheduled Meds: . acetaminophen  650 mg Oral Q8H  . apixaban   5 mg Oral BID  . cefTRIAXone (ROCEPHIN)  IV  1 g Intravenous Q24H  . furosemide  80 mg Intravenous BID  . gabapentin  800 mg Oral TID  . guaiFENesin  600 mg Oral BID  . lidocaine  1 patch Transdermal Q24H  . nortriptyline  25 mg Oral QHS  . polyethylene glycol  17 g Oral Daily  . potassium chloride  20 mEq Oral BID  . senna-docusate  1 tablet Oral BID   Continuous Infusions:   PRN Meds: albuterol, alum & mag hydroxide-simeth  Time spent: 30 minutes  Author: Berle Mull, MD [facility] Pager: [phone] 12/26/2015 7:00 PM  If 7PM-7AM, please contact night-coverage at [email or web address], password [facility]

## 2015-12-26 NOTE — Clinical Social Work Note (Signed)
Clinical Social Work Assessment  Patient Details  Name: Bianca [name] MRN: [id number] Date of Birth: [birth year]  Date of referral:  12/23/15               Reason for consult:  Facility Placement, Discharge Planning                Permission sought to share information with:  Facility Sport and exercise psychologist, Family Supports Permission granted to share information::  Yes, Verbal Permission Granted  Name::     Surveyor, quantity::  SNF's  Relationship::  Son  Contact Information:  [phone]  Housing/Transportation Living arrangements for the past 2 months:  [place] of Information:  Patient, Adult Children, Medical Team Patient Interpreter Needed:  None Criminal Activity/Legal Involvement Pertinent to Current Situation/Hospitalization:  No - Comment as needed Significant Relationships:  Adult Children Lives with:  Facility Resident Do you feel safe going back to the place where you live?  Yes Need for family participation in patient care:  Yes (Comment)  Care giving concerns:  PT recommending SNF once medically stable for discharge.   Social Worker assessment / plan:  CSW met with patient. Daughter at bedside. CSW introduced role and explained that discharge planning would be discussed. CSW confirmed PT recommendations for patient to return to SNF. Patient discharged from [facility] within one day of hospitalization. Patient does not wish to return to [facility] but gave verbal permission to fax information out to other facilities in the [facility] area. She and her daughter are aware that she is in her copay days. Patient's daughter says she was paying $100 per day at [facility]. No further concerns. CSW encouraged patient and her daughter to contact CSW as needed. CSW will continue to follow patient and her family and facilitate discharge to SNF once medically stable.  Employment status:  Retired Forensic scientist:  Other (Comment Required) (Acupuncturist) PT Recommendations:  [place] / Referral to community resources:  [place]  Patient/Family's Response to care:  Patient and her daughter are agreeable to SNF placement. Patient's family supportive and involved in patient's care. Patient and her daughter appreciated social work intervention.  Patient/Family's Understanding of and Emotional Response to Diagnosis, Current Treatment, and Prognosis:  Patient and her daughter understand the need for rehab before returning home. Patient's daughter states that she is unable to go back to [place] because of her mobility dependence. Both patient and her daughter appear happy with care at hospital.  Emotional Assessment Appearance:  Appears stated age Attitude/Demeanor/Rapport:   (Pleasant) Affect (typically observed):  Accepting, Appropriate, Calm, Pleasant Orientation:  Oriented to Self, Oriented to Place, Oriented to  Time, Oriented to Situation Alcohol / Substance use:  Never Used Psych involvement (Current and /or in the community):  No (Comment)  Discharge Needs  Concerns to be addressed:  Care Coordination Readmission within the last 30 days:  Yes Current discharge risk:  Dependent with Mobility Barriers to Discharge:  [place], LCSW 12/26/2015, 11:34 AM

## 2015-12-26 NOTE — Progress Notes (Signed)
Advanced Heart Failure Rounding Note  Referring Physician: Dr. Posey Pronto Primary Physician: Maurice Small, MD Primary Cardiologist: Dr Marlou Porch Primary HF: Dr. Haroldine Laws   Reason for Consultation: A/C diastolic CHF  Subjective:    Admitted 12/21/15 after "dyspnea attack" despite diuretics being increased in HF clinic earlier that day.  Given 40 mg IV lasix in ED, but then switched back to po diuretics.  Weight began to climb and placed back on IV lasix, but in setting BUN rising and hypotension this was again held and 500 cc of fluid given.   Not sleeping very well.  Breathing about the same as admission.  Peed better yesterday. Got IV fluids.   Only negative 800 cc and down 1 lb from admit. BMET pending this am.   Objective:   Weight Range: 237 lb (107.502 kg) Body mass index is 38.27 kg/(m^2).   Vital Signs:   Temp:  [97.9 F (36.6 C)-98.2 F (36.8 C)] 98.2 F (36.8 C) (07/10 0647) Pulse Rate:  [87-95] 87 (07/10 0647) Resp:  [18] 18 (07/09 2301) BP: (99)/(44) 99/44 mmHg (07/09 2038) SpO2:  [92 %-100 %] 96 % (07/09 2301) Weight:  [237 lb (107.502 kg)] 237 lb (107.502 kg) (07/10 0356) Last BM Date: 12/26/15  Weight change: Filed Weights   12/24/15 0618 12/25/15 0332 12/26/15 0356  Weight: 236 lb (107.049 kg) 237 lb 3.2 oz (107.593 kg) 237 lb (107.502 kg)    Intake/Output:   Intake/Output Summary (Last 24 hours) at 12/26/15 0753 Last data filed at 12/26/15 0646  Gross per 24 hour  Intake   1500 ml  Output   1427 ml  Net     73 ml     Physical Exam: General: Elderly appearing. NAD.  HEENT: normal Neck: supple. JVP appears to be 9-10 cm, though difficult. Carotids 2+ bilat; no bruits. No thyromegaly or nodule noted.  Cor: PMI nondisplaced. RRR, no M/G/R Lungs: Mildly diminished basilar sounds. On 2 liters [place].  Abdomen: obese soft, ND. Organized R flank hematoma without ecchymosis. No HSM. No bruits or masses. +BS Extremities: no cyanosis, clubbing, rash, trace  ankle edema Neuro: alert & orientedx3, cranial nerves grossly intact. moves all 4 extremities w/o difficulty. Affect pleasant  Telemetry: A-sensed V-paced 90s  Labs: CBC  Recent Labs  12/24/15 0418 12/25/15 1212  WBC 9.0 10.4  NEUTROABS  --  6.3  HGB 11.9* 12.4  HCT 39.0 39.6  MCV 95.8 95.7  PLT 232 [id number]   Basic Metabolic Panel  Recent Labs  12/24/15 0418 12/25/15 0906  NA 136 134*  134*  K 3.9 3.9  3.9  CL 98* 96*  97*  CO2 28 28  28   GLUCOSE 126* 169*  168*  BUN 25* 33*  33*  CREATININE 1.22* 1.25*  1.25*  CALCIUM 9.1 8.9  9.0  MG 2.0 2.1  PHOS  --  4.0   Liver Function Tests  Recent Labs  12/25/15 0906  ALBUMIN 3.5   No results for input(s): LIPASE, AMYLASE in the last 72 hours. Cardiac Enzymes No results for input(s): CKTOTAL, CKMB, CKMBINDEX, TROPONINI in the last 72 hours.  BNP: BNP (last 3 results)  Recent Labs  11/02/15 0736 11/24/15 0249 12/21/15 1839  BNP 143.9* 801.6* 948.0*    ProBNP (last 3 results) No results for input(s): PROBNP in the last 8760 hours.   D-Dimer No results for input(s): DDIMER in the last 72 hours. Hemoglobin A1C No results for input(s): HGBA1C in the last 72 hours. Fasting Lipid  Panel No results for input(s): CHOL, HDL, LDLCALC, TRIG, CHOLHDL, LDLDIRECT in the last 72 hours. Thyroid Function Tests  Recent Labs  12/25/15 1213  TSH 6.624*    Other results:     Imaging/Studies:   No results found.  Latest Echo  Latest Cath   Medications:     Scheduled Medications: . acetaminophen  650 mg Oral Q8H  . cefTRIAXone (ROCEPHIN)  IV  1 g Intravenous Q24H  . gabapentin  800 mg Oral TID  . guaiFENesin  600 mg Oral BID  . lidocaine  1 patch Transdermal Q24H  . nortriptyline  25 mg Oral QHS  . polyethylene glycol  17 g Oral Daily  . senna-docusate  1 tablet Oral BID     Infusions:     PRN Medications:  albuterol, alum & mag hydroxide-simeth   Assessment/Plan   1. Acute on  chronic diastolic heart failure (recovered LVEF after CRT) NICM EF 35% s/p BiV ICD - [employer]. -> EF 50-55% on echo 10/2014. Clinically hypervolemic. Multiple HF meds stopped after flank hemorrhage, recently restarted on carvedilol, which could have lead to transient decompensation.  - Coreg now on hold (was started for PVCs) - She remains volume overloaded on exam. Will give IV lasix 80 mg BID today as long as SBP above 90.   2. Paroxysmal AF - CHA2DS2-VASc 6, off anticoagulation due to hematoma with very delayed resolution. - Will discuss resumption of anticoag and optimal therapy with MD.  Likely NOAC.  3. Morbid obesity with h/o CO2 retention  - clear contributor to her dyspnea 4. R Flank Hematoma - Seen by surgery yesterday.  Is organized flank hematoma. No role for surgery.  - Spoke with Dr. Georganna Skeans of [facility].  States it is OK to resume anticoag, and hold as needed.  5. UTI - on antibiotics, contaminated culture.  Suspect 1-2 more days of IV diuresis as tolerated.    Length of Stay: 3  Shirley Friar PA-C 12/26/2015, 7:53 AM  Advanced Heart Failure Team Pager [phone] (M-F; 7a - 4p)  Please contact [facility] for night-coverage after hours (4p -7a ) and weekends on [email or web address]  Patient seen with PA, agree with the above note.  On exam, she is volume overloaded.  To get Lasix 80 mg IV bid today, reassess in am.   Discussed with surgery, will start Eliquis 5 mg bid and follow closely for rebleed.  Hopefully will be stable.   She remains in NSR.   Loralie Champagne 12/26/2015

## 2015-12-26 NOTE — Progress Notes (Signed)
ANTICOAGULATION CONSULT NOTE - Initial Consult  Pharmacy Consult for Eliquis Indication: atrial fibrillation  Allergies  Allergen Reactions  . Quinapril Hcl Swelling    Tongue and throat  . Tape Other (See Comments)    Plastic tape causes irritation.   . Neosporin [Neomycin-Polymyxin-Gramicidin] Rash    Patient Measurements: Height: 5\' 6"  (167.6 cm) Weight: 237 lb (107.502 kg) (c scale) IBW/kg (Calculated) : 59.3   Vital Signs: Temp: 97.8 F (36.6 C) (07/10 0945) Temp Source: Oral (07/10 0945) BP: 117/76 mmHg (07/10 0945) Pulse Rate: 90 (07/10 0945)  Labs:  Recent Labs  12/24/15 0418 12/25/15 0906 12/25/15 1212 12/26/15 0905  HGB 11.9*  --  12.4 12.6  HCT 39.0  --  39.6 40.1  PLT 232  --  276 271  CREATININE 1.22* 1.25*  1.25*  --  1.30*    Estimated Creatinine Clearance: 42.1 mL/min (by C-G formula based on Cr of 1.3).   Medical History: Past Medical History  Diagnosis Date  . Pacemaker   . ICD (implantable cardiac defibrillator) in place   . Myocardial infarction ([place]) 2008  . Sleep apnea   . Arthritis   . Neuromuscular disorder ([place])   . CHF (congestive heart failure) ([place])   . Hypertension   . Dysrhythmia   . Chronic systolic heart failure ([place])   . Acute systolic heart failure ([employer])   . Acute blood loss anemia   . Depression 11/24/2015  . Physical deconditioning   . Hypokalemia   . Chronic combined systolic and diastolic heart failure ([place])   . A-fib ([place])   . Right flank hematoma   . CKD (chronic kidney disease), stage III 12/22/2015   Assessment:   Admitted 12/21/15 after "dyspnea attack" despite diuretics being increased in HF clinic earlier that day. Admited with flank hematoma after fall.  Anticoagulation: PAF with CHADS2VASC 6 off anticoagulation due to hematoma with very delayed resolution. Ok's to resume NOAC per Dr. Grand[place] Silos.   Goal of Therapy:  Therapeutic oral anticoagulation Monitor platelets by anticoagulation protocol: Yes    Plan:  Eliquis 5mg  Bid for PAF Will watch SCr as it continues to trend up.   Fredi Hurtado S. Alford Highland, PharmD, [facility] Clinical Staff Pharmacist Pager [place], [place] 12/26/2015,9:56 AM

## 2015-12-26 NOTE — NC FL2 (Signed)
[place] LEVEL OF CARE SCREENING TOOL     IDENTIFICATION  Patient Name: Bianca [name] Birthdate: 01/11/1933 Sex: female Admission Date (Current Location): 12/21/2015  [facility] and [place] Number:  Herbalist and Address:  [facility]. [facility], [place] [address], [place], [place] [address]      Provider Number: [id number]  Attending Physician Name and Address:  Lavina Hamman, MD  Relative Name and Phone Number:       Current Level of Care: Hospital Recommended Level of Care: [place] Prior Approval Number:    Date Approved/Denied:   PASRR Number: [id number]  Discharge Plan: SNF    Current Diagnoses: Patient Active Problem List   Diagnosis Date Noted  . Subclinical hypothyroidism 12/25/2015  . Acute on chronic diastolic congestive heart failure ([place])   . CKD (chronic kidney disease), stage III 12/22/2015  . Hypoxia 12/22/2015  . Hypoxemia 12/21/2015  . CHF exacerbation ([place]) 11/24/2015  . Physical deconditioning 11/24/2015  . Hyperglycemia 11/24/2015  . Peripheral neuropathy ([place]) 11/24/2015  . HLD (hyperlipidemia) 11/24/2015  . Depression 11/24/2015  . Chest pain 11/24/2015  . Neuropathy involving both lower extremities ([place])   . Paroxysmal atrial fibrillation (H[facility])   . OSA (obstructive sleep apnea)   . E. coli UTI   . Subcutaneous hematoma   . Fall 10/25/2015  . Afib ([place]) 01/19/2015  . Long-term (current) use of anticoagulants 07/20/2014  . Persistent atrial fibrillation ([place]) 07/20/2014  . Encounter for therapeutic drug monitoring 07/16/2013  . Chronic systolic heart failure ([place]) 07/16/2013  . History of implantable cardioverter-defibrillator (ICD) placement 07/16/2013  . Morbid obesity ([place]) 07/16/2013  . Chronic anticoagulation 07/16/2013  . Knee osteoarthritis 07/16/2013  . HYPOKALEMIA 08/09/2010  . Neuropathy ([place]) 08/09/2010  . Chronic diastolic (congestive) heart failure ([place]) 08/09/2010  .  Automatic implantable cardioverter-defibrillator in situ 08/09/2010    Orientation RESPIRATION BLADDER Height & Weight     Self, Time, Situation, Place  O2, Other (Comment) (Nasal Canula 3 L. Has used Cpap once this admission.) Incontinent Weight: 237 lb (107.502 kg) (c scale) Height:  5\' 6"  (167.6 cm)  BEHAVIORAL SYMPTOMS/MOOD NEUROLOGICAL BOWEL NUTRITION STATUS   (None)  (None) Continent Diet (Heart healthy)  AMBULATORY STATUS COMMUNICATION OF NEEDS Skin   Limited Assist Verbally Normal                       Personal Care Assistance Level of Assistance  Bathing, Feeding, Dressing Bathing Assistance: Limited assistance Feeding assistance: Independent Dressing Assistance: Limited assistance     Functional Limitations Info  Sight, Hearing, Speech Sight Info: Adequate Hearing Info: Impaired Speech Info: Adequate    SPECIAL CARE FACTORS FREQUENCY  PT (By licensed PT)     PT Frequency: 5 x week              Contractures Contractures Info: Not present    Additional Factors Info  Code Status, Allergies, Psychotropic Code Status Info: Full Allergies Info: Quinapril Hcl, Tape, Neosporin Psychotropic Info: Depression         Current Medications (12/26/2015):  This is the current hospital active medication list Current Facility-Administered Medications  Medication Dose Route Frequency Provider Last Rate Last Dose  . acetaminophen (TYLENOL) tablet 650 mg  650 mg Oral Q8H Lavina Hamman, MD   650 mg at 12/26/15 0533  . albuterol (PROVENTIL) (2.5 MG/3ML) 0.083% nebulizer solution 2.5 mg  2.5 mg Nebulization Q2H PRN Edwin Dada, MD      .  alum & mag hydroxide-simeth (MAALOX/MYLANTA) 200-200-20 MG/5ML suspension 30 mL  30 mL Oral Q4H PRN Lavina Hamman, MD   30 mL at 12/23/15 0008  . apixaban (ELIQUIS) tablet 5 mg  5 mg Oral BID Karren Cobble, RPH   5 mg at 12/26/15 1002  . cefTRIAXone (ROCEPHIN) 1 g in dextrose 5 % 50 mL IVPB  1 g Intravenous Q24H Lavina Hamman, MD   1 g at 12/25/15 1350  . furosemide (LASIX) injection 80 mg  80 mg Intravenous BID Satira Mccallum Tillery, PA-C   80 mg at 12/26/15 0934  . gabapentin (NEURONTIN) capsule 800 mg  800 mg Oral TID Edwin Dada, MD   800 mg at 12/26/15 0935  . guaiFENesin (MUCINEX) 12 hr tablet 600 mg  600 mg Oral BID Gardiner Barefoot, NP   600 mg at 12/26/15 0935  . lidocaine (LIDODERM) 5 % 1 patch  1 patch Transdermal Q24H Lavina Hamman, MD   1 patch at 12/26/15 0935  . nortriptyline (PAMELOR) capsule 25 mg  25 mg Oral QHS Edwin Dada, MD   25 mg at 12/25/15 2103  . polyethylene glycol (MIRALAX / GLYCOLAX) packet 17 g  17 g Oral Daily Lavina Hamman, MD   17 g at 12/26/15 0934  . potassium chloride SA (K-DUR,KLOR-CON) CR tablet 20 mEq  20 mEq Oral BID Satira Mccallum Tillery, PA-C   20 mEq at 12/26/15 0935  . senna-docusate (Senokot-S) tablet 1 tablet  1 tablet Oral BID Lavina Hamman, MD   1 tablet at 12/26/15 0935     Discharge Medications: Please see discharge summary for a list of discharge medications.  Relevant Imaging Results:  Relevant Lab Results:   Additional Information SS#: [id number]  Candie Chroman, LCSW

## 2015-12-26 NOTE — Discharge Instructions (Signed)

## 2015-12-26 NOTE — Clinical Social Work Note (Signed)
CSW presented bed offers to patient. No supports at bedside. Patient was familiar with [facility] and familiar with locations of other facilities. Patient stated that she did not think that there was much point to rehab with her age and medical status. CSW provided emotional support. CSW offered to call and discuss bed offers with patient's daughter-in-law/HCPOA. Patient said she would discuss with her and her son. CSW will follow up in the morning.  Dayton Scrape, [place]

## 2015-12-26 NOTE — Progress Notes (Signed)
RT came to place patient on CPAP. Patient is already on home unit CPAP. NO O2 bleed in needed. Patient tolerating well.

## 2015-12-26 NOTE — Care Management Important Message (Signed)
Important Message  Patient Details  Name: Bianca [name] MRN: [id number] Date of Birth: February 25, 1933   Medicare Important Message Given:  Yes    Merrie Epler, [place] 12/26/2015, 8:22 AM

## 2015-12-26 NOTE — Clinical Social Work Placement (Signed)
   CLINICAL SOCIAL WORK PLACEMENT  NOTE  Date:  12/26/2015  Patient Details  Name: Bianca [name] MRN: [id number] Date of Birth: 1933-06-05  Clinical Social Work is seeking post-discharge placement for this patient at the [place] level of care (*CSW will initial, date and re-position this form in  chart as items are completed):  Yes   Patient/family provided with [place] Work Department's list of facilities offering this level of care within the geographic area requested by the patient (or if unable, by the patient's family).  Yes   Patient/family informed of their freedom to choose among providers that offer the needed level of care, that participate in Medicare, Medicaid or managed care program needed by the patient, have an available bed and are willing to accept the patient.  Yes   Patient/family informed of [facility]'s ownership interest in [facility] and [facility], as well as of the fact that they are under no obligation to receive care at these facilities.  PASRR submitted to EDS on 12/26/15     PASRR number received on       Existing PASRR number confirmed on 12/26/15     FL2 transmitted to all facilities in geographic area requested by pt/family on 12/26/15     FL2 transmitted to all facilities within larger geographic area on       Patient informed that his/her managed care company has contracts with or will negotiate with certain facilities, including the following:            Patient/family informed of bed offers received.  Patient chooses bed at       Physician recommends and patient chooses bed at      Patient to be transferred to   on  .  Patient to be transferred to facility by       Patient family notified on   of transfer.  Name of family member notified:        PHYSICIAN Please sign FL2     Additional Comment:    _______________________________________________ Candie Chroman, LCSW 12/26/2015,  11:39 AM

## 2015-12-27 ENCOUNTER — Encounter (HOSPITAL_COMMUNITY): Payer: Self-pay | Admitting: Nurse Practitioner

## 2015-12-27 DIAGNOSIS — N183 Chronic kidney disease, stage 3 (moderate): Secondary | ICD-10-CM

## 2015-12-27 DIAGNOSIS — I472 Ventricular tachycardia, unspecified: Secondary | ICD-10-CM | POA: Insufficient documentation

## 2015-12-27 LAB — BASIC METABOLIC PANEL
ANION GAP: 14 (ref 5–15)
ANION GAP: 9 (ref 5–15)
BUN: 29 mg/dL — ABNORMAL HIGH (ref 6–20)
BUN: 26 mg/dL — ABNORMAL HIGH (ref 6–20)
CALCIUM: 8.9 mg/dL (ref 8.9–10.3)
CHLORIDE: 98 mmol/L — AB (ref 101–111)
CO2: 22 mmol/L (ref 22–32)
CO2: 35 mmol/L — AB (ref 22–32)
Calcium: 8.6 mg/dL — ABNORMAL LOW (ref 8.9–10.3)
Chloride: 93 mmol/L — ABNORMAL LOW (ref 101–111)
Creatinine, Ser: 0.86 mg/dL (ref 0.44–1.00)
Creatinine, Ser: 0.96 mg/dL (ref 0.44–1.00)
GFR calc Af Amer: >60 mL/min (ref >60)
GFR, EST NON AFRICAN AMERICAN: 54 mL/min — AB (ref >60)
GLUCOSE: 173 mg/dL — AB (ref 65–99)
Glucose, Bld: 95 mg/dL (ref 65–99)
POTASSIUM: 4.9 mmol/L (ref 3.5–5.1)
POTASSIUM: 3.3 mmol/L — AB (ref 3.5–5.1)
SODIUM: 134 mmol/L — AB (ref 135–145)
Sodium: 137 mmol/L (ref 135–145)

## 2015-12-27 LAB — CBC WITH DIFFERENTIAL/PLATELET
BASOS ABS: 0 10*3/uL (ref 0.0–0.1)
Basophils Relative: 0 %
Eosinophils Absolute: 0.2 10*3/uL (ref 0.0–0.7)
Eosinophils Relative: 2 %
HCT: 42.3 % (ref 36.0–46.0)
HEMOGLOBIN: 13.5 g/dL (ref 12.0–15.0)
LYMPHS ABS: 2.5 10*3/uL (ref 0.7–4.0)
LYMPHS PCT: 28 %
MCH: 30.1 pg (ref 26.0–34.0)
MCHC: 31.9 g/dL (ref 30.0–36.0)
MCV: 94.2 fL (ref 78.0–100.0)
Monocytes Absolute: 0.8 10*3/uL (ref 0.1–1.0)
Monocytes Relative: 10 %
NEUTROS PCT: 60 %
Neutro Abs: 5.3 10*3/uL (ref 1.7–7.7)
PLATELETS: 224 10*3/uL (ref 150–400)
RBC: 4.49 MIL/uL (ref 3.87–5.11)
RDW: 16.4 % — ABNORMAL HIGH (ref 11.5–15.5)
WBC: 8.8 10*3/uL (ref 4.0–10.5)

## 2015-12-27 LAB — MRSA PCR SCREENING: MRSA BY PCR: NEGATIVE

## 2015-12-27 MED ORDER — GABAPENTIN 400 MG PO CAPS
800.0000 mg | ORAL_CAPSULE | Freq: Three times a day (TID) | ORAL | Status: DC
  Administered 2015-12-27 – 2016-01-01 (×15): 800 mg via ORAL
  Filled 2015-12-27 (×15): qty 2

## 2015-12-27 MED ORDER — METOLAZONE 2.5 MG PO TABS
2.5000 mg | ORAL_TABLET | Freq: Once | ORAL | Status: AC
  Administered 2015-12-27: 2.5 mg via ORAL
  Filled 2015-12-27: qty 1

## 2015-12-27 MED ORDER — SALINE SPRAY 0.65 % NA SOLN
1.0000 | NASAL | Status: DC | PRN
  Filled 2015-12-27: qty 44

## 2015-12-27 MED ORDER — SALINE SPRAY 0.65 % NA SOLN
1.0000 | Freq: Every day | NASAL | Status: DC
  Administered 2015-12-27 – 2016-01-01 (×5): 1 via NASAL
  Filled 2015-12-27: qty 44

## 2015-12-27 MED ORDER — METOLAZONE 2.5 MG PO TABS: 2.5000 mg | ORAL_TABLET | Freq: Once | ORAL | Status: DC

## 2015-12-27 MED ORDER — POTASSIUM CHLORIDE CRYS ER 20 MEQ PO TBCR
40.0000 meq | EXTENDED_RELEASE_TABLET | Freq: Once | ORAL | Status: AC
  Administered 2015-12-27: 40 meq via ORAL
  Filled 2015-12-27: qty 2

## 2015-12-27 NOTE — Progress Notes (Signed)
Pt had a mild nose bleed. Cool was cloth applied. Subsided after in a few minutes.  Jonni Sanger, [place] in to see pt at this time and instructed will order some nasal spray.  Will continue to monitor.  Karie Kirks, Therapist, sports.

## 2015-12-27 NOTE — Progress Notes (Signed)
Patient already on home cpap unit when RT entered room. No O2 bleed in required. Patient is resting comfortably.

## 2015-12-27 NOTE — Progress Notes (Addendum)
Pt with multiple loose BM's after being given Miralax and Senokot. Pt afebrile, without any abdominal pain, and WBC WNL. Pt does not meet protocol criteria for C.diff testing at this time. Will continue to monitor.

## 2015-12-27 NOTE — Progress Notes (Signed)
Advanced Heart Failure Rounding Note  Referring Physician: Dr. Posey Pronto Primary Physician: Maurice Small, MD Primary Cardiologist: Dr Marlou Porch Primary HF: Dr. Haroldine Laws   Reason for Consultation: A/C diastolic CHF  Subjective:    Admitted 12/21/15 after "dyspnea attack" despite diuretics being increased in HF clinic earlier that day.  Given 40 mg IV lasix in ED, but then switched back to po diuretics.  Weight began to climb and placed back on IV lasix, but in setting BUN rising and hypotension this was again held and 500 cc of fluid given.   Breathing improved. Had mild nosebleed this am. Stopped spontaneously. Was only on 02 at night at home. Now on de-humidified oxygen 24 hrs a day.     I/O even yesterday, though out 1 lb. ? Accuracy.  Creatinine much improved today even with IV lasix 80 mg BID. Creatinine 1.3 -> 0.89  Objective:   Weight Range: 235 lb 9.6 oz (106.867 kg) Body mass index is 38.04 kg/(m^2).   Vital Signs:   Temp:  [97.8 F (36.6 C)-98.1 F (36.7 C)] 98 F (36.7 C) (07/11 0409) Pulse Rate:  [88-91] 89 (07/11 0409) Resp:  [18] 18 (07/11 0409) BP: (100-117)/(67-76) 100/67 mmHg (07/11 0409) SpO2:  [92 %-99 %] 92 % (07/11 0409) Weight:  [235 lb 9.6 oz (106.867 kg)] 235 lb 9.6 oz (106.867 kg) (07/11 0409) Last BM Date: 12/27/15  Weight change: Filed Weights   12/25/15 0332 12/26/15 0356 12/27/15 0409  Weight: 237 lb 3.2 oz (107.593 kg) 237 lb (107.502 kg) 235 lb 9.6 oz (106.867 kg)    Intake/Output:   Intake/Output Summary (Last 24 hours) at 12/27/15 0754 Last data filed at 12/27/15 0420  Gross per 24 hour  Intake   1200 ml  Output   1494 ml  Net   -294 ml     Physical Exam: General: Elderly appearing. NAD.  HEENT: normal, dried blood on [place]. Neck: supple. JVP 10-11 cm. Carotids 2+ bilat; no bruits. No thyromegaly or nodule noted.  Cor: PMI nondisplaced. RRR, no M/G/R Lungs: Mild basilar crackles. On 2 liters [place].  Abdomen: obese soft, ND. Organized  R flank hematoma without ecchymosis. No HSM. No bruits or masses. +BS Extremities: no cyanosis, clubbing, rash, trace ankle edema Neuro: alert & orientedx3, cranial nerves grossly intact. moves all 4 extremities w/o difficulty. Affect pleasant  Telemetry: Reviewed personally,  A-sensed V-paced 90s  Labs: CBC  Recent Labs  12/25/15 1212 12/26/15 0905 12/27/15 0253  WBC 10.4 9.8 8.8  NEUTROABS 6.3  --  5.3  HGB 12.4 12.6 13.5  HCT 39.6 40.1 42.3  MCV 95.7 94.8 94.2  PLT 276 271 [id number]   Basic Metabolic Panel  Recent Labs  12/25/15 0906 12/26/15 0905 12/27/15 0253  NA 134*  134* 134* 134*  K 3.9  3.9 3.9 4.9  CL 96*  97* 94* 98*  CO2 28  28 29 22   GLUCOSE 169*  168* 170* 95  BUN 33*  33* 31* 29*  CREATININE 1.25*  1.25* 1.30* 0.86  CALCIUM 8.9  9.0 9.1 8.6*  MG 2.1 2.6*  --   PHOS 4.0  --   --    Liver Function Tests  Recent Labs  12/25/15 0906  ALBUMIN 3.5   No results for input(s): LIPASE, AMYLASE in the last 72 hours. Cardiac Enzymes No results for input(s): CKTOTAL, CKMB, CKMBINDEX, TROPONINI in the last 72 hours.  BNP: BNP (last 3 results)  Recent Labs  11/02/15 0736 11/24/15 0249 12/21/15  1839  BNP 143.9* 801.6* 948.0*    ProBNP (last 3 results) No results for input(s): PROBNP in the last 8760 hours.   D-Dimer No results for input(s): DDIMER in the last 72 hours. Hemoglobin A1C No results for input(s): HGBA1C in the last 72 hours. Fasting Lipid Panel No results for input(s): CHOL, HDL, LDLCALC, TRIG, CHOLHDL, LDLDIRECT in the last 72 hours. Thyroid Function Tests  Recent Labs  12/25/15 1213  TSH 6.624*    Other results:     Imaging/Studies:  No results found.  Latest Echo  Latest Cath   Medications:     Scheduled Medications: . acetaminophen  650 mg Oral Q8H  . apixaban  5 mg Oral BID  . cefTRIAXone (ROCEPHIN)  IV  1 g Intravenous Q24H  . furosemide  80 mg Intravenous BID  . gabapentin  800 mg Oral TID  .  guaiFENesin  600 mg Oral BID  . lidocaine  1 patch Transdermal Q24H  . nortriptyline  25 mg Oral QHS  . polyethylene glycol  17 g Oral Daily  . potassium chloride  20 mEq Oral BID  . senna-docusate  1 tablet Oral BID    Infusions:    PRN Medications: albuterol, alum & mag hydroxide-simeth   Assessment/Plan   1. Acute on chronic diastolic heart failure (recovered LVEF after CRT) NICM EF 35% s/p BiV ICD - Medtronic. -> EF 50-55% on echo 10/2015. Clinically hypervolemic. Multiple HF meds stopped after flank hemorrhage, recently restarted on carvedilol, which could have lead to transient decompensation.  - Coreg now on hold (was started for PVCs) - Creatinine much better this morning.  - Remains volume overloaded on exam and optivol. Continue IV lasix 80 mg BID today and will give dose of 2.5 mg metolazone with improved renal function.  Give lasix as long as SBP above 90.  2. Paroxysmal AF - CHA2DS2-VASc 6, off anticoagulation due to hematoma with very delayed resolution. - Started on eliquis 5 mg BID yesterday.  CBC stable. Had mild nose bleed this am.  3. Morbid obesity with h/o CO2 retention  - clear contributor to her dyspnea 4. R Flank Hematoma - Seen by surgery 12/25/15.  Is organized flank hematoma. No role for surgery.  - Started on Eliquis yesterday. Remains stable thus far.  5. UTI - on antibiotics, contaminated culture. 6. AKI - Resolved this am with continued lasix.  7. Epistaxis - Very mild nose bleed this morning stat stopped spontaneously. Pt was on DE-humidified 02 24 hrs, where she was only using 02 for sleep at home. - Had humidifier to 02 and Saline spray.  - Continue to follow closely as just started on Eliquis.  8. Mild Hyperkalemia - With hemolysis will recheck BMET 9. VT on ICD interrogation Will need to keep K > 4.0 with recent ATP for VT. Mg 2.6 12/26/15 - Have asked EP to see as device is also ERI as of 12/22/15.  Suspect 1-2 more days of IV diuresis as  tolerated.     Addendum: Repeat BMET with K 3.3. Will supp K+.  Length of Stay: 4  Shirley Friar PA-C 12/27/2015, 7:54 AM  Advanced Heart Failure Team Pager [phone] (M-F; 7a - 4p)  Please contact [facility] for night-coverage after hours (4p -7a ) and weekends on [email or web address]  Patient seen with PA, agree with the above note.  She remains volume overloaded on exam.  Will give dose of metolazone 2.5 today with next dose of IV Lasix.  Device interrogated, at [facility].  Will need generator change eventually, discussed with EP.  Had 2 runs VT in 6/17 terminated by ATP.    Needs SNF.   Loralie Champagne 12/27/2015

## 2015-12-27 NOTE — Progress Notes (Signed)
Physical Therapy Treatment Patient Details Name: Bianca [name] MRN: [id number] DOB: 1933-05-25 Today's Date: 12/27/2015    History of Present Illness 82 yo female with onset of SOB and hypoxemia with CO2 retention was cleared for mult pulm issues, decided she has CO2 retention with worsened sleep apnea.  PMHx:  neuropathy, CHF, CKD 3    PT Comments    Patient much improved over last session with activity tolerance, transfers and distance with ambulation.  Patient still needing STSNF prior to d/c to ILF with assist versus ALF.  PT to continue to follow in acute setting as well.   Follow Up Recommendations  SNF     Equipment Recommendations  None recommended by PT    Recommendations for Other Services       Precautions / Restrictions Precautions Precautions: Fall    Mobility  Bed Mobility               General bed mobility comments: NT pt on BSC upon my entry  Transfers Overall transfer level: Needs assistance Equipment used: Rolling walker (2 wheeled) Transfers: Sit to/from Stand Sit to Stand: Min assist;Min guard         General transfer comment: up from [facility] minguard with UE support on armrests, from EOB min A for ant weight shift up from low surface (dtr-in-law states pt has lift chair)  Ambulation/Gait Ambulation/Gait assistance: Min guard;Min assist Ambulation Distance (Feet): 150 Feet Assistive device: Rolling walker (2 wheeled) Gait Pattern/deviations: Step-through pattern;Trunk flexed;Decreased stride length     General Gait Details: min c/o LE fatigue with ambulation and stopped to stand and rest x 2    Stairs            Wheelchair Mobility    Modified Rankin (Stroke Patients Only)       Balance Overall balance assessment: Needs assistance   Sitting balance-Leahy Scale: Good     Standing balance support: Bilateral upper extremity supported Standing balance-Leahy Scale: Poor Standing balance comment: UE support needed in  standing                    Cognition Arousal/Alertness: Awake/alert Behavior During Therapy: WFL for tasks assessed/performed Overall Cognitive Status: Within Functional Limits for tasks assessed                      Exercises      General Comments General comments (skin integrity, edema, etc.): daughter in law reports plan is to go to rehab then hopefully ILF, but has the option for ALF as well.       Pertinent Vitals/Pain Faces Pain Scale: No hurt    Home Living                      Prior Function            PT Goals (current goals can now be found in the care plan section) Progress towards PT goals: Progressing toward goals    Frequency  Min 3X/week    PT Plan Current plan remains appropriate    Co-evaluation             End of Session Equipment Utilized During Treatment: Gait belt;Oxygen Activity Tolerance: Patient limited by fatigue Patient left: in chair;with call bell/phone within reach     Time: 1422-1455 PT Time Calculation (min) (ACUTE ONLY): 33 min  Charges:  $Gait Training: 8-22 mins $Therapeutic Activity: 8-22 mins  G CodesReginia Naas 12/27/2015, 4:54 PM  [name], [name] 12/27/2015

## 2015-12-27 NOTE — Progress Notes (Signed)
[facility] Progress Note  Patient: Bianca [name]:[id number]   PCP: Jonathon Bellows, MD DOB: September 03, 1932   DOA: 12/21/2015   DOS: 12/27/2015   Date of Service: the patient was seen and examined on 12/27/2015  Subjective: Denies any acute complaint. Continues to feel generalized weakness and fatigue. No chest pain no shortness of breath. No dizziness or lightheadedness. Complains of back pain is still present. Nutrition: tolerating oral diet minimal oral intake  Brief hospital course: Pt. with PMH of ASCVD, OSA, security 3, chronic combined CHF mild HTN; admitted on 12/21/2015, with complaint of shortness of breath as well as recurrent palpitation, was found to have PVC as well as CHF exacerbation. Currently further plan is continue diuresis as well as monitor on telemetry.  Assessment and Plan: 1. Acute on chronic diastolic CHF. Dyspnea Consulted cardiology, patient was given IV diuresis with Lasix and later on due to softer blood pressure Lasix is on hold. Getting Lasix IV again. We will continue to monitor the patient's volume as well as ins and outs. No evidence of PE as well. She refused VQ scan. No pneumonia. Initially started on Coreg and now on hold due to hypotension as well as patient's symptoms dizziness with that.  2. Hypokalemia:  Continue replacement, maintained K more than 4 magnesium more than 2  3. Frequent PVCs. Initially patient was resumed on Coreg, currently Holding Coreg due to patient's symptoms as well as hypotension  4. Neuropathy:  Continue gabapentin and nortriptyline  5. pAF:  CHADS2Vasc 6.  Not on anticoagulation since hematoma. Surgery evaluated the patient and recommended patient can be started back on anticoagulation. Pharmacy consulted.   6. OSA on CPAP:  -Continue CPAP therapy  7. CKD III:  Stable, continue to monitor  8. Abdominal wall hematoma. Appears stable hemoglobin stable. H&H remains stable, it will likely resolve on its  own.  back on anti-coagulation. Per cardiology.  9. Subclinical hypothyroidism. TSH is elevated but free T4 is normal. Patient complains of generalized fatigue. Based on this evaluation at present it is not recommended to start the patient on T4 replacement. Recommend to continue to monitor as an outpatient.  10. Generalized fatigue and sense of not feeling well. UTI ESR normal V-67 normal folic acid normal hemoglobin stable no evidence of active infection. LFTs are normal. At present I do not have any acute etiology. Patient is being treated for UTI with IV ceftriaxone last day 12/28/2015. although I do not feel that the current treatment for the UTI will also make the patient feel better, as This might be patient's new baseline.  Pain management: when necessary Tylenol Activity: consulted physical therapy, recommend SNF Bowel regimen: last BM 12/27/2015 Diet: cardiac diet DVT Prophylaxis: Therapeutic anticoagulation  Advance goals of care discussion: full  Family Communication: family was present at bedside, at the time of interview.   Disposition:  Discharge to SNF Expected discharge date: 12/29/2015, improvement in oxygenation and overload  Consultants: Cardiology, general surgery Procedures: none  Antibiotics: Anti-infectives    Start     Dose/Rate Route Frequency Ordered Stop   12/24/15 1500  cefTRIAXone (ROCEPHIN) 1 g in dextrose 5 % 50 mL IVPB     1 g 100 mL/hr over 30 Minutes Intravenous Every 24 hours 12/24/15 1341          Intake/Output Summary (Last 24 hours) at 12/27/15 2036 Last data filed at 12/27/15 2022  Gross per 24 hour  Intake    460 ml  Output  3450 ml  Net  -2990 ml   Filed Weights   12/25/15 0332 12/26/15 0356 12/27/15 0409  Weight: 107.593 kg (237 lb 3.2 oz) 107.502 kg (237 lb) 106.867 kg (235 lb 9.6 oz)    Objective: Physical Exam: Filed Vitals:   12/27/15 0409 12/27/15 1000 12/27/15 1637 12/27/15 2022  BP: 100/67 119/76 107/78 125/77    Pulse: 89 92 95 94  Temp: 98 F (36.7 C)  97.8 F (36.6 C) 97.6 F (36.4 C)  TempSrc: Oral  Oral Oral  Resp: _0 Height:      Weight: 106.867 kg (235 lb 9.6 oz)     SpO2: 92%  100% 98%   General: Alert, Awake and Oriented to Time, Place and Person. Appear in mild distress Eyes: PERRL, Conjunctiva normal ENT: Oral Mucosa clear moist. Neck: no JVD, no Abnormal Mass Or lumps Cardiovascular: S1 and S2 Present, aortic systolic Murmur, Respiratory: Bilateral Air entry equal and Decreased, Clear to Auscultation, no Crackles, no wheezes Abdomen: Bowel Sound persent, Soft and no tenderness, No CVA tenderness Skin: no redness, no Rash  Extremities: no Pedal edema, no calf tenderness Neurologic: Grossly no focal neuro deficit. Bilaterally Equal motor strength  Data Reviewed: CBC:  Recent Labs Lab 12/21/15 1829 12/24/15 0418 12/25/15 1212 12/26/15 0905 12/27/15 0253  WBC 7.0 9.0 10.4 9.8 8.8  NEUTROABS  --   --  6.3  --  5.3  HGB 12.5 11.9* 12.4 12.6 13.5  HCT 40.5 39.0 39.6 40.1 42.3  MCV 95.1 95.8 95.7 94.8 94.2  PLT 247 232 276 271 825   Basic Metabolic Panel:  Recent Labs Lab 12/22/15 0727 12/23/15 0125 12/24/15 0418 12/25/15 0906 12/26/15 0905 12/27/15 0253 12/27/15 1201  NA 139 138 136 134*  134* 134* 134* 137  K 3.1* 4.0 3.9 3.9  3.9 3.9 4.9 3.3*  CL 99* 99* 98* 96*  97* 94* 98* 93*  CO2 _1 35*  GLUCOSE 125* 130* 126* 169*  168* 170* 95 173*  BUN 15 19 25* 33*  33* 31* 29* 26*  CREATININE 1.02* 1.11* 1.22* 1.25*  1.25* 1.30* 0.86 0.96  CALCIUM 9.3 9.2 9.1 8.9  9.0 9.1 8.6* 8.9  MG 1.8 1.9 2.0 2.1 2.6*  --   --   PHOS  --   --   --  4.0  --   --   --     Liver Function Tests:  Recent Labs Lab 12/25/15 0906  ALBUMIN 3.5   No results for input(s): LIPASE, AMYLASE in the last 168 hours. No results for input(s): AMMONIA in the last 168 hours. Coagulation Profile: No results for input(s): INR, PROTIME in the last 168  hours. Cardiac Enzymes:  Recent Labs Lab 12/22/15 0727 12/22/15 1241 12/22/15 1843 12/23/15 0125  TROPONINI 0.06* 0.06* 0.05* 0.04*   BNP (last 3 results) No results for input(s): PROBNP in the last 8760 hours.  CBG: No results for input(s): GLUCAP in the last 168 hours.  Studies: No results found.   Scheduled Meds: . acetaminophen  650 mg Oral Q8H  . apixaban  5 mg Oral BID  . cefTRIAXone (ROCEPHIN)  IV  1 g Intravenous Q24H  . furosemide  80 mg Intravenous BID  . gabapentin  800 mg Oral TID  . guaiFENesin  600 mg Oral BID  . lidocaine  1 patch Transdermal Q24H  . nortriptyline  25 mg Oral QHS  . polyethylene glycol  17  g Oral Daily  . potassium chloride  40 mEq Oral Once  . senna-docusate  1 tablet Oral BID  . sodium chloride  1 spray Each Nare Daily   Continuous Infusions:   PRN Meds: albuterol, alum & mag hydroxide-simeth  Time spent: 30 minutes  Author: Berle Mull, MD [facility] Pager: [phone] 12/27/2015 8:36 PM  If 7PM-7AM, please contact night-coverage at [email or web address], password [email or web address]

## 2015-12-27 NOTE — Progress Notes (Signed)
Had full interrogation of Medtronic device.   Optivol remains elevated significant for continued volume overload.   Had two episodes of VT up into 200 with successful ATP on 12/16/15 and 11/28/15.    Reached ERI 12/22/15. Hadn't sent in full transmission since March. (She did not take her device interrogator into Rehab with her).   Would likely benefit from seeing EP while in hospital.  Will let them know.     Legrand Como [address] [place], PA-C 12/27/2015 10:48 AM

## 2015-12-27 NOTE — Consult Note (Signed)
ELECTROPHYSIOLOGY CONSULT NOTE    Patient ID: Bianca [name] MRN: [id number], DOB/AGE: 80-Jun-1934 80 y.o.  Admit date: 12/21/2015 Date of Consult: 12/27/2015  Primary Physician: Jonathon Bellows, MD Primary Cardiologist: Marlou Porch HF MD: [place] Electrophysiologist: Lovena Le Referring MD: Aundra Dubin  Reason for Consultation: ICD at [facility] and ventricular tachycardia   HPI:  Bianca [name] is a 82 y.o. female with a past medical history significant for NICM, chronic combined systolic and diastolic heart failure, obesity, paroxysmal atrial fibrillation, hypertension, and chronic kidney disease.  She was admitted to the hospital on 12/21/15 with worsening shortness of breath and anxiety. She has been diuresed and today feels that her breathing is improved.  She has a MDT CRTD that was interrogated today and found to be at [facility].  EP has been asked to evaluate for treatment options.   She currently states that she is feeling better.  Her breathing is improved. She denies recent chest pain, shortness of breath, fevers, chills, nausea or vomiting.   Echo 10/26/15 demonstrated EF [id number], grade 1 diastolic dysfunction, LA severely dilated, mild MR.   Past Medical History  Diagnosis Date  . Myocardial infarction ([place]) 2008  . Sleep apnea   . Arthritis   . Neuromuscular disorder ([place])   . Hypertension   . Chronic systolic heart failure ([place])   . Acute blood loss anemia   . Depression 11/24/2015  . Physical deconditioning   . Hypokalemia   . A-fib ([place])   . Right flank hematoma   . CKD (chronic kidney disease), stage III 12/22/2015  . Paroxysmal atrial fibrillation [facility])      Surgical History:  Past Surgical History  Procedure Laterality Date  . Cholecystectomy    . Abdominal hysterectomy    . Ankle surgery    . Insert / replace / remove pacemaker  2008  . Knee ligament reconstruction  2012  . Tonsillectomy    . Cardiac catheterization    . Eye surgery       Prescriptions prior to admission    Medication Sig Dispense Refill Last Dose  . acetaminophen (TYLENOL) 325 MG tablet Take 650 mg by mouth 2 (two) times daily.   12/21/2015 at Unknown time  . allopurinol (ZYLOPRIM) 100 MG tablet Take 100 mg by mouth daily. Takes along with a 300 mg tablet to equal 400 mg   12/21/2015 at Unknown time  . allopurinol (ZYLOPRIM) 300 MG tablet Take 300 mg by mouth daily. Take with the 100mg  tablet to equal 400mg  daily   12/21/2015 at Unknown time  . atorvastatin (LIPITOR) 10 MG tablet Take 10 mg by mouth daily at 6 PM.    12/20/2015 at Unknown time  . Calcium Carbonate-Vitamin D (CALCIUM 600+D) 600-400 MG-UNIT per tablet Take 1 tablet by mouth daily.    12/21/2015 at Unknown time  . Cholecalciferol (VITAMIN D3) 1000 UNITS CAPS Take 1 capsule by mouth daily.   12/21/2015 at Unknown time  . colestipol (COLESTID) 1 G tablet Take 2 g by mouth at bedtime.    12/20/2015 at Unknown time  . furosemide (LASIX) 80 MG tablet Take 1 tab in AM and 1/2 tab in PM (Patient taking differently: Take 40-80 mg by mouth 2 (two) times daily. Take 1 tab in AM and 1/2 tab in PM)   12/21/2015 at 1600 dose  . gabapentin (NEURONTIN) 800 MG tablet Take 800 mg by mouth 3 (three) times daily.   12/21/2015 at 1 dose  . guaiFENesin (MUCINEX) 600 MG 12  hr tablet Take 1 tablet (600 mg total) by mouth 2 (two) times daily.   12/21/2015 at am  . nitroGLYCERIN (NITROSTAT) 0.4 MG SL tablet Place 1 tablet (0.4 mg total) under the tongue every 5 (five) minutes as needed for chest pain. 25 tablet 6 Past Month at Unknown time  . nortriptyline (PAMELOR) 25 MG capsule Take 25 mg by mouth at bedtime.   12/20/2015 at Unknown time  . OXYGEN Inhale 2 L/min into the lungs at bedtime as needed (uses as bedtime everynight and then occasionally iun the daytime as needed for SOB).    12/21/2015 at Unknown time  . potassium chloride SA (K-DUR,KLOR-CON) 20 MEQ tablet Take 2 tablets (40 mEq total) by mouth 2 (two) times daily.   12/21/2015 at am    Inpatient Medications:  .  acetaminophen  650 mg Oral Q8H  . apixaban  5 mg Oral BID  . cefTRIAXone (ROCEPHIN)  IV  1 g Intravenous Q24H  . furosemide  80 mg Intravenous BID  . gabapentin  800 mg Oral TID  . guaiFENesin  600 mg Oral BID  . lidocaine  1 patch Transdermal Q24H  . nortriptyline  25 mg Oral QHS  . polyethylene glycol  17 g Oral Daily  . senna-docusate  1 tablet Oral BID  . sodium chloride  1 spray Each Nare Daily    Allergies:  Allergies  Allergen Reactions  . Quinapril Hcl Swelling    Tongue and throat  . Tape Other (See Comments)    Plastic tape causes irritation.   . Neosporin [Neomycin-Polymyxin-Gramicidin] Rash    Social History   Social History  . Marital Status: Widowed    Spouse Name: N/A  . Number of Children: N/A  . Years of Education: N/A   Occupational History  . Not on file.   Social History Main Topics  . Smoking status: Former Research scientist (life sciences)  . Smokeless tobacco: Former Systems developer  . Alcohol Use: No  . Drug Use: No  . Sexual Activity: Not Currently   Other Topics Concern  . Not on file   Social History Narrative     Family History  Problem Relation Age of Onset  . Tuberculosis Father   . Stroke Mother   . Congestive Heart Failure Mother   . Diabetes Brother   . Stroke Brother   . Cancer Brother   . Heart attack Son      Review of Systems: All other systems reviewed and are otherwise negative except as noted above.  Physical Exam: Filed Vitals:   12/26/15 2045 12/26/15 2254 12/27/15 0409 12/27/15 1000  BP: 113/67  100/67 119/76  Pulse: 91 88 89 92  Temp: 98.1 F (36.7 C)  98 F (36.7 C)   TempSrc: Oral  Oral   Resp: 18 18 18    Height:      Weight:   235 lb 9.6 oz (106.867 kg)   SpO2: 94% 96% 92%     GEN- The patient is elderly and obese appearing, alert and oriented x 3 today.   HEENT: normocephalic, atraumatic; sclera clear, conjunctiva pink; hearing intact; oropharynx clear; neck supple  Lungs- Clear to ausculation bilaterally, normal work of  breathing.  No wheezes, rales, rhonchi Heart- Regular rate and rhythm, no murmurs, rubs or gallops  GI- obese, non-tender, non-distended, bowel sounds present, R flank hematoma Extremities- no clubbing, cyanosis, or edema; DP/PT/radial pulses 2+ bilaterally MS- no significant deformity or atrophy Skin- warm and dry, no rash or lesion Psych- euthymic  mood, full affect Neuro- strength and sensation are intact  Labs:   Lab Results  Component Value Date   WBC 8.8 12/27/2015   HGB 13.5 12/27/2015   HCT 42.3 12/27/2015   MCV 94.2 12/27/2015   PLT 224 12/27/2015     Recent Labs Lab 12/27/15 0253  NA 134*  K 4.9  CL 98*  CO2 22  BUN 29*  CREATININE 0.86  CALCIUM 8.6*  GLUCOSE 95      Radiology/Studies: Dg Chest 2 View 12/21/2015  CLINICAL DATA:  Shortness of breath off and on for 1 month. EXAM: CHEST  2 VIEW COMPARISON:  11/24/2015 FINDINGS: Low lung volumes on this AP and lateral exam. The cardio pericardial silhouette is enlarged. No focal airspace consolidation, pulmonary edema, or pleural effusion. Left-sided pacer/ AICD remains in place. The visualized bony structures of the thorax are intact. IMPRESSION: Low volume film without acute cardiopulmonary findings. Electronically Signed   By: Misty Stanley M.D.   On: 12/21/2015 19:26    [id number] rhythm with ventricular pacing, rate 98  TELEMETRY: sinus rhythm with V pacing   DEVICE HISTORY: MDT CRTD implanted 2008; gen change 2012  Assessment/Plan: 1.  Ventricular tachycardia 2 episodes successfully ATP terminated June 12th and June 30th of this year (cycle length 252msec) NSVT episodes that are recorded are mostly atrial tachycardia with 1:1 conduction and short Normal device function - device at ERI as of 12/27/15 Will need generator change at some point in the next 3 months - will arrange appt with Dr Lovena Le in the next few weeks.  Despite normalization of EF, with appropriate device therapy, would change out with CRTD. Keep  K >3.9, Mg >1.8 Would hold off on AAD therapy as episodes coincide with worsening HF status She no longer drives  2.  Acute on chronic diastolic heart failure Management per AHF team Will enroll in ICM clinic to follow more closely  3.   Paroxysmal atrial fibrillation  No significant recurrence in last year by device interrogation today Continue Eliquis for CHADS2VASC of 6  4.  NICM EF improved post CRT implant   5.  Morbid obesity Body mass index is 38.04 kg/(m^2). Weight loss encouraged   Dr Rayann Heman to see later today.    Signed, Chanetta Marshall, NP 12/27/2015 12:29 PM   I have seen, examined the patient, and reviewed the above assessment and plan.  On exam, morbidly obese and chronically ill. Changes to above are made where necessary.  Will continue current management.  Elective ICD replacement to be arranged by Dr Lovena Le once clinically improved (3-4 weeks).  Will make follow-up in the office with Dr Lovena Le.  Electrophysiology team to see as needed while here. Please call with questions.   Co Sign: Thompson Grayer, MD 12/27/2015 5:39 PM

## 2015-12-27 NOTE — Clinical Social Work Note (Signed)
CSW met with patient. Daughter-in-law/HCPOA at bedside. Discussed concerns with previous SNF stay. She has the list of facilities that accepted the patient but is deciding between two.   Dayton Scrape, [place]

## 2015-12-27 NOTE — Research (Signed)
Apixaban Validation Study (ClinicalTrials.gov Identifier: [id number]) RESEARCH SUBJECT. Purpose: Obtain fresh samples that will be used to assess the performances of STA-Apixaban Calibrator and STA-Apixaban Control in combination with the STA-Liquid Anti-Xa to determine the quantity of apixaban in plasma samples by measurement of its direct anti-Xa activity. Apixaban Validation Study is sponsored by [employer].The fresh samples will collected by completing a one time blood draw on approved patients.   Inclusion Criteria: Must meet AT LEAST one of the following:  weight </= 60kg or >/= 75 years or Hct <39% for female or < 36% for female or renal impairment or co-medication with ASA/NSAIDs, or co-medication with anti-platelet agents.  Apixaban Validation Study Informed Consent   Subject Name: Bianca [name]  This patient, Bianca [name], has been consented to the above clinical trial according to FDA regulations, GCP guidelines and [employer]'s SOPs. The informed consent form and study design have been explained to this patient by this study coordinator at 12:55 PM on 12/27/2015. The patient demonstrated comprehension of this clinical trial and study requirements/expectations. No study procedures have been initiated before consenting of this patient. The patient was given sufficient time for reading the consent form. All risks, benefits and options have been thoroughly discussed and all questions were answered per the patient's satisfaction. This patient was not coerced in any way to participate in this clinical trial. This patient has voluntarily signed consent version 2.0 at 14:00 on 12/27/2015. A copy of the signed consent form was given to the patient and a copy was placed in the subject's medical record. Subject was thanked for their participation in research and contribution to science.  Dennison Mascot, RN Clinical Research Nurse  [place], [facility] Office: [phone]

## 2015-12-28 DIAGNOSIS — R0600 Dyspnea, unspecified: Secondary | ICD-10-CM | POA: Insufficient documentation

## 2015-12-28 LAB — CBC
HCT: 41.2 % (ref 36.0–46.0)
Hemoglobin: 12.9 g/dL (ref 12.0–15.0)
MCH: 29.9 pg (ref 26.0–34.0)
MCHC: 31.3 g/dL (ref 30.0–36.0)
MCV: 95.6 fL (ref 78.0–100.0)
PLATELETS: 234 10*3/uL (ref 150–400)
RBC: 4.31 MIL/uL (ref 3.87–5.11)
RDW: 16.4 % — AB (ref 11.5–15.5)
WBC: 7.7 10*3/uL (ref 4.0–10.5)

## 2015-12-28 LAB — BASIC METABOLIC PANEL
ANION GAP: 13 (ref 5–15)
BUN: 26 mg/dL — ABNORMAL HIGH (ref 6–20)
CALCIUM: 8.9 mg/dL (ref 8.9–10.3)
CO2: 31 mmol/L (ref 22–32)
CREATININE: 0.83 mg/dL (ref 0.44–1.00)
Chloride: 94 mmol/L — ABNORMAL LOW (ref 101–111)
GFR calc Af Amer: >60 mL/min (ref >60)
Glucose, Bld: 91 mg/dL (ref 65–99)
Potassium: 3.4 mmol/L — ABNORMAL LOW (ref 3.5–5.1)
SODIUM: 138 mmol/L (ref 135–145)

## 2015-12-28 LAB — MAGNESIUM: Magnesium: 1.8 mg/dL (ref 1.7–2.4)

## 2015-12-28 MED ORDER — METOLAZONE 2.5 MG PO TABS
2.5000 mg | ORAL_TABLET | Freq: Once | ORAL | Status: AC
  Administered 2015-12-28: 2.5 mg via ORAL
  Filled 2015-12-28 (×2): qty 1

## 2015-12-28 MED ORDER — CARVEDILOL 3.125 MG PO TABS
3.1250 mg | ORAL_TABLET | Freq: Two times a day (BID) | ORAL | Status: DC
  Administered 2015-12-28: 3.125 mg via ORAL

## 2015-12-28 MED ORDER — POTASSIUM CHLORIDE CRYS ER 20 MEQ PO TBCR
20.0000 meq | EXTENDED_RELEASE_TABLET | Freq: Once | ORAL | Status: AC
  Administered 2015-12-28: 20 meq via ORAL
  Filled 2015-12-28: qty 1

## 2015-12-28 MED ORDER — FUROSEMIDE 10 MG/ML IJ SOLN
80.0000 mg | Freq: Two times a day (BID) | INTRAMUSCULAR | Status: DC
  Administered 2015-12-28 – 2015-12-29 (×4): 80 mg via INTRAVENOUS
  Filled 2015-12-28 (×4): qty 8

## 2015-12-28 MED ORDER — MAGNESIUM SULFATE 2 GM/50ML IV SOLN
2.0000 g | Freq: Once | INTRAVENOUS | Status: AC
  Administered 2015-12-28: 2 g via INTRAVENOUS
  Filled 2015-12-28: qty 50

## 2015-12-28 MED ORDER — POTASSIUM CHLORIDE CRYS ER 20 MEQ PO TBCR
40.0000 meq | EXTENDED_RELEASE_TABLET | Freq: Two times a day (BID) | ORAL | Status: AC
  Administered 2015-12-28 (×2): 40 meq via ORAL
  Filled 2015-12-28 (×2): qty 2

## 2015-12-28 MED ORDER — FUROSEMIDE 10 MG/ML IJ SOLN: 80.0000 mg | Freq: Two times a day (BID) | INTRAMUSCULAR | Status: DC

## 2015-12-28 MED ORDER — TORSEMIDE 20 MG PO TABS: 40.0000 mg | ORAL_TABLET | Freq: Every day | ORAL | Status: DC

## 2015-12-28 NOTE — Clinical Social Work Placement (Signed)
   CLINICAL SOCIAL WORK PLACEMENT  NOTE  Date:  12/28/2015  Patient Details  Name: Bianca [name] MRN: [id number] Date of Birth: 1933-05-09  Clinical Social Work is seeking post-discharge placement for this patient at the [place] level of care (*CSW will initial, date and re-position this form in  chart as items are completed):  Yes   Patient/family provided with [place] Work Department's list of facilities offering this level of care within the geographic area requested by the patient (or if unable, by the patient's family).  Yes   Patient/family informed of their freedom to choose among providers that offer the needed level of care, that participate in Medicare, Medicaid or managed care program needed by the patient, have an available bed and are willing to accept the patient.  Yes   Patient/family informed of [facility]'s ownership interest in [facility] and [facility], as well as of the fact that they are under no obligation to receive care at these facilities.  PASRR submitted to EDS on 12/26/15     PASRR number received on       Existing PASRR number confirmed on 12/26/15     FL2 transmitted to all facilities in geographic area requested by pt/family on 12/26/15     FL2 transmitted to all facilities within larger geographic area on       Patient informed that his/her managed care company has contracts with or will negotiate with certain facilities, including the following:        Yes   Patient/family informed of bed offers received.  Patient chooses bed at [facility]     Physician recommends and patient chooses bed at      Patient to be transferred to [facility] on  .  Patient to be transferred to facility by       Patient family notified on   of transfer.  Name of family member notified:        PHYSICIAN       Additional Comment:    _______________________________________________ Candie Chroman,  LCSW 12/28/2015, 12:26 PM

## 2015-12-28 NOTE — Progress Notes (Signed)
Had 35 beats of wide complex QRS.   Supping electrolytes.  Will add back Coreg 3.25 mg BID and follow closely.     Legrand Como [address]" [place], PA-C 12/28/2015 8:59 AM

## 2015-12-28 NOTE — Progress Notes (Addendum)
Advanced Heart Failure Rounding Note  Referring Physician: Dr. Posey Pronto Primary Physician: Maurice Small, MD Primary Cardiologist: Dr Marlou Porch Primary HF: Dr. Haroldine Laws   Reason for Consultation: A/C diastolic CHF  Subjective:    Admitted 12/21/15 after "dyspnea attack" despite diuretics being increased in HF clinic earlier that day.  Given 40 mg IV lasix in ED, but then switched back to po diuretics.  Weight began to climb and placed back on IV lasix, but in setting BUN rising and hypotension this was again held and 500 cc of fluid given.   Breathing and swelling feel much better this morning.  No change in flank hematoma per patient.  Now complaining of Sinus congestion, though improves with Saline spray.   Out 2.8 L and down 4 lbs with IV lasix 80 mg BID with 2.5 mg metolazone yesterday. Creatinine stable to improved at 0.83. K 3.4  Objective:   Weight Range: 231 lb 11.2 oz (105.098 kg) Body mass index is 37.42 kg/(m^2).   Vital Signs:   Temp:  [97.6 F (36.4 C)-97.8 F (36.6 C)] 97.8 F (36.6 C) (07/12 0353) Pulse Rate:  [84-95] 84 (07/12 0353) Resp:  [18-20] 18 (07/12 0353) BP: (95-125)/(65-78) 95/65 mmHg (07/12 0353) SpO2:  [98 %-100 %] 100 % (07/12 0353) Weight:  [231 lb 11.2 oz (105.098 kg)] 231 lb 11.2 oz (105.098 kg) (07/12 0353) Last BM Date: 12/27/15  Weight change: Filed Weights   12/26/15 0356 12/27/15 0409 12/28/15 0353  Weight: 237 lb (107.502 kg) 235 lb 9.6 oz (106.867 kg) 231 lb 11.2 oz (105.098 kg)    Intake/Output:   Intake/Output Summary (Last 24 hours) at 12/28/15 0718 Last data filed at 12/28/15 0222  Gross per 24 hour  Intake    700 ml  Output   4400 ml  Net  -3700 ml     Physical Exam: General: Elderly appearing. NAD.  HEENT: normal,  Neck: supple. JVP 8-9 cm. Carotids 2+ bilat; no bruits. No thyromegaly or nodule noted.  Cor: PMI nondisplaced. RRR, no M/G/R Lungs: Scant basilar crackles. On 2 liters [place].  Abdomen: obese soft, ND.  Organized R flank hematoma without ecchymosis. No HSM. No bruits or masses. +BS Extremities: no cyanosis, clubbing, rash, trace ankle edema Neuro: alert & orientedx3, cranial nerves grossly intact. moves all 4 extremities w/o difficulty. Affect pleasant  Telemetry: Reviewed personally,  A-sensed V-paced 90s  Labs: CBC  Recent Labs  12/25/15 1212  12/27/15 0253 12/28/15 0320  WBC 10.4  < > 8.8 7.7  NEUTROABS 6.3  --  5.3  --   HGB 12.4  < > 13.5 12.9  HCT 39.6  < > 42.3 41.2  MCV 95.7  < > 94.2 95.6  PLT 276  < > 224 234  < > = values in this interval not displayed. Basic Metabolic Panel  Recent Labs  12/25/15 0906 12/26/15 0905  12/27/15 1201 12/28/15 0320  NA 134*  134* 134*  < > 137 138  K 3.9  3.9 3.9  < > 3.3* 3.4*  CL 96*  97* 94*  < > 93* 94*  CO2 28  28 29   < > 35* 31  GLUCOSE 169*  168* 170*  < > 173* 91  BUN 33*  33* 31*  < > 26* 26*  CREATININE 1.25*  1.25* 1.30*  < > 0.96 0.83  CALCIUM 8.9  9.0 9.1  < > 8.9 8.9  MG 2.1 2.6*  --   --  1.8  PHOS  4.0  --   --   --   --   < > = values in this interval not displayed. Liver Function Tests  Recent Labs  12/25/15 0906  ALBUMIN 3.5   No results for input(s): LIPASE, AMYLASE in the last 72 hours. Cardiac Enzymes No results for input(s): CKTOTAL, CKMB, CKMBINDEX, TROPONINI in the last 72 hours.  BNP: BNP (last 3 results)  Recent Labs  11/02/15 0736 11/24/15 0249 12/21/15 1839  BNP 143.9* 801.6* 948.0*    ProBNP (last 3 results) No results for input(s): PROBNP in the last 8760 hours.   D-Dimer No results for input(s): DDIMER in the last 72 hours. Hemoglobin A1C No results for input(s): HGBA1C in the last 72 hours. Fasting Lipid Panel No results for input(s): CHOL, HDL, LDLCALC, TRIG, CHOLHDL, LDLDIRECT in the last 72 hours. Thyroid Function Tests  Recent Labs  12/25/15 1213  TSH 6.624*    Other results:     Imaging/Studies:  No results found.  Latest Echo  Latest  Cath   Medications:     Scheduled Medications: . acetaminophen  650 mg Oral Q8H  . apixaban  5 mg Oral BID  . cefTRIAXone (ROCEPHIN)  IV  1 g Intravenous Q24H  . furosemide  80 mg Intravenous BID  . gabapentin  800 mg Oral TID  . guaiFENesin  600 mg Oral BID  . lidocaine  1 patch Transdermal Q24H  . nortriptyline  25 mg Oral QHS  . polyethylene glycol  17 g Oral Daily  . senna-docusate  1 tablet Oral BID  . sodium chloride  1 spray Each Nare Daily    Infusions:    PRN Medications: albuterol, alum & mag hydroxide-simeth   Assessment/Plan   1. Acute on chronic diastolic heart failure (recovered LVEF after CRT) NICM EF 35% s/p BiV ICD - [employer]. -> EF 50-55% on echo 10/2015. Clinically hypervolemic. Multiple HF meds stopped after flank hemorrhage, recently restarted on carvedilol, which could have lead to transient decompensation.  - Coreg now on hold (was started for PVCs) Pressure remains soft. Once fully diuresed can try and add back low dose coreg, this evening vs tomorrow am.  - Continue lasix 80 mg IV BID for today. Will repeat 2.5 mg metolazone this morning.  - Prior to admit she was taking lasix 80 mg daily.  She was increased to 80 mg am and 40 mg pm the day of admission, so had NOT been on this dose.  Can likely use 60 mg torsemide daily for new dosing.  - Creatinine continues to improve.  - Volume status improved but remains mildly elevated. (was markedly elevated on optivol yesterday.  Continue IV lasix 80 mg IV BID this am at least.  Will schedule po torsemide to start in am.  2. Paroxysmal AF - CHA2DS2-VASc 6, off anticoagulation due to hematoma with very delayed resolution. - Started on eliquis 5 mg BID 12/26/15.  CBC stable. 3. Morbid obesity with h/o CO2 retention  - clear contributor to her dyspnea 4. R Flank Hematoma - Seen by surgery 12/25/15.  Is organized flank hematoma. No role for surgery.  - Started on Eliquis 12/26/15. Remains stable thus far.  5. UTI  - on antibiotics, contaminated culture. 6. AKI - Resolved despite increasing diuretics.  7. Epistaxis - No further with humidifier added to 02.  - Continue to follow closely as just started on Eliquis.  8. VT on ICD interrogation - Goal K > 3.9 and Mg > 1.8 per  EP.  Will supp both today.  - EP to see as outpatient to schedule generator change as patient is at [facility] as of 12/22/15  Disposition: To SNF tomorrow vs Thursday pending continued diuresis and bed placement.   Length of Stay: 5  Shirley Friar PA-C 12/28/2015, 7:18 AM  Advanced Heart Failure Team Pager [phone] (M-F; 7a - 4p)  Please contact [facility] for night-coverage after hours (4p -7a ) and weekends on [email or web address]  Patient seen with PA, agree with the above note.  She is still volume overloaded on exam. Will give another dose metolazone along with IV Lasix today as she diuresed quite well yesterday.  When we transition her to po, would use torsemide 60 mg po daily.   Hemoglobin stable, Eliquis restarted.   Has had NSVT, would restart Coreg 3.125 mg bid.   Remains in NSR.   Loralie Champagne 12/28/2015 8:46 AM

## 2015-12-28 NOTE — Progress Notes (Signed)
[facility] Progress Note  Patient: Bianca [name] MEQ:[id number]   PCP: Jonathon Bellows, MD DOB: [birth year]   DOA: 12/21/2015   DOS: 12/28/2015   Date of Service: the patient was seen and examined on 12/28/2015  Subjective: No new complaint. No worsening of SOB. No chest pain. Edema is slowly resolving with diuresis.  Brief hospital course: Pt. with PMH of ASCVD, OSA, security 3, chronic combined CHF mild HTN; admitted on 12/21/2015, with complaint of shortness of breath as well as recurrent palpitation, was found to have PVC as well as CHF exacerbation. Currently further plan is continue diuresis as well as monitor on telemetry.  Assessment and Plan: 1. Acute on chronic diastolic CHF - Continue IV diuresis. Likely DC on other loop diuretics than lasix.  Dyspnea Consulted cardiology, patient was given IV diuresis with Lasix and later on due to softer blood pressure Lasix is on hold. Getting Lasix IV again. We will continue to monitor the patient's volume as well as ins and outs. No evidence of PE as well. She refused VQ scan. No pneumonia. Initially started on Coreg and now on hold due to hypotension as well as patient's symptoms dizziness with that.  2. Hypokalemia:  Continue replacement, maintained K more than 4 magnesium more than 2  3. Frequent PVCs. Initially patient was resumed on Coreg, currently Holding Coreg due to patient's symptoms as well as hypotension  4. Neuropathy:  Continue gabapentin and nortriptyline  5. pAF:  CHADS2Vasc 6.  Not on anticoagulation since hematoma. Surgery evaluated the patient and recommended patient can be started back on anticoagulation. Pharmacy consulted.   6. OSA on CPAP:  -Continue CPAP therapy  7. CKD III:  Stable, continue to monitor  8. Abdominal wall hematoma. Appears stable hemoglobin stable. H&H remains stable, it will likely resolve on its own.  back on anti-coagulation. Per cardiology.  9. Subclinical  hypothyroidism. TSH is elevated but free T4 is normal. Patient complains of generalized fatigue. Based on this evaluation at present it is not recommended to start the patient on T4 replacement. Recommend to continue to monitor as an outpatient.  10. Generalized fatigue and sense of not feeling well. UTI ESR normal W-97 normal folic acid normal hemoglobin stable no evidence of active infection. LFTs are normal. At present I do not have any acute etiology. Patient is being treated for UTI with IV ceftriaxone last day 12/28/2015. although I do not feel that the current treatment for the UTI will also make the patient feel better, as This might be patient's new baseline.  Pain management: when necessary Tylenol Activity: consulted physical therapy, recommend SNF Bowel regimen: last BM 12/27/2015 Diet: cardiac diet DVT Prophylaxis: Therapeutic anticoagulation  Advance goals of care discussion: full  Family Communication: family was present at bedside, at the time of interview.   Disposition:  Discharge to SNF Expected discharge date: 12/29/2015, improvement in oxygenation and overload  Consultants: Cardiology, general surgery Procedures: none  Antibiotics: Anti-infectives    Start     Dose/Rate Route Frequency Ordered Stop   12/24/15 1500  cefTRIAXone (ROCEPHIN) 1 g in dextrose 5 % 50 mL IVPB     1 g 100 mL/hr over 30 Minutes Intravenous Every 24 hours 12/24/15 1341          Intake/Output Summary (Last 24 hours) at 12/28/15 1320 Last data filed at 12/28/15 0953  Gross per 24 hour  Intake    870 ml  Output   4400 ml  Net  -3530 ml  Filed Weights   12/26/15 0356 12/27/15 0409 12/28/15 0353  Weight: 107.502 kg (237 lb) 106.867 kg (235 lb 9.6 oz) 105.098 kg (231 lb 11.2 oz)    Objective: Physical Exam: Filed Vitals:   12/27/15 2022 12/28/15 0353 12/28/15 1008 12/28/15 1213  BP: 125/77 95/65 115/76 119/77  Pulse: 94 84 90 97  Temp: 97.6 F (36.4 C) 97.8 F (36.6 C)   98.6 F (37 C)  TempSrc: Oral Oral  Oral  Resp: 20 18    Height:      Weight:  105.098 kg (231 lb 11.2 oz)    SpO2: 98% 100%  98%   General: Alert, Awake and Oriented to Time, Place and Person. Appear in mild distress Eyes: PERRL, Conjunctiva normal ENT: Oral Mucosa clear moist. Neck: no JVD, no Abnormal Mass Or lumps Cardiovascular: S1 and S2 Present, aortic systolic Murmur, Respiratory: Bilateral Air entry equal and Decreased, Clear to Auscultation, no Crackles, no wheezes Abdomen: Bowel Sound persent, Soft and no tenderness, No CVA tenderness Skin: no redness, no Rash  Extremities: no Pedal edema, no calf tenderness Neurologic: Grossly no focal neuro deficit. Bilaterally Equal motor strength  Data Reviewed: CBC:  Recent Labs Lab 12/24/15 0418 12/25/15 1212 12/26/15 0905 12/27/15 0253 12/28/15 0320  WBC 9.0 10.4 9.8 8.8 7.7  NEUTROABS  --  6.3  --  5.3  --   HGB 11.9* 12.4 12.6 13.5 12.9  HCT 39.0 39.6 40.1 42.3 41.2  MCV 95.8 95.7 94.8 94.2 95.6  PLT 232 276 271 224 570   Basic Metabolic Panel:  Recent Labs Lab 12/23/15 0125 12/24/15 0418 12/25/15 0906 12/26/15 0905 12/27/15 0253 12/27/15 1201 12/28/15 0320  NA 138 136 134*  134* 134* 134* 137 138  K 4.0 3.9 3.9  3.9 3.9 4.9 3.3* 3.4*  CL 99* 98* 96*  97* 94* 98* 93* 94*  CO2 _0 35* 31  GLUCOSE 130* 126* 169*  168* 170* 95 173* 91  BUN 19 25* 33*  33* 31* 29* 26* 26*  CREATININE 1.11* 1.22* 1.25*  1.25* 1.30* 0.86 0.96 0.83  CALCIUM 9.2 9.1 8.9  9.0 9.1 8.6* 8.9 8.9  MG 1.9 2.0 2.1 2.6*  --   --  1.8  PHOS  --   --  4.0  --   --   --   --     Liver Function Tests:  Recent Labs Lab 12/25/15 0906  ALBUMIN 3.5   No results for input(s): LIPASE, AMYLASE in the last 168 hours. No results for input(s): AMMONIA in the last 168 hours. Coagulation Profile: No results for input(s): INR, PROTIME in the last 168 hours. Cardiac Enzymes:  Recent Labs Lab 12/22/15 0727  12/22/15 1241 12/22/15 1843 12/23/15 0125  TROPONINI 0.06* 0.06* 0.05* 0.04*   BNP (last 3 results) No results for input(s): PROBNP in the last 8760 hours.  CBG: No results for input(s): GLUCAP in the last 168 hours.  Studies: No results found.   Scheduled Meds: . acetaminophen  650 mg Oral Q8H  . apixaban  5 mg Oral BID  . carvedilol  3.125 mg Oral BID WC  . cefTRIAXone (ROCEPHIN)  IV  1 g Intravenous Q24H  . furosemide  80 mg Intravenous BID  . gabapentin  800 mg Oral TID  . guaiFENesin  600 mg Oral BID  . lidocaine  1 patch Transdermal Q24H  . nortriptyline  25 mg Oral QHS  . polyethylene glycol  17 g  Oral Daily  . potassium chloride  40 mEq Oral BID  . senna-docusate  1 tablet Oral BID  . sodium chloride  1 spray Each Nare Daily   Continuous Infusions:   PRN Meds: albuterol, alum & mag hydroxide-simeth  Time spent: 30 minutes  Author: Berle Mull, MD [facility] Pager: [phone] 12/28/2015 1:20 PM  If 7PM-7AM, please contact night-coverage at [email or web address], password [facility]

## 2015-12-28 NOTE — Progress Notes (Signed)
Pt had 35 beats of wide QRS and asymptomatic.  Jonni Sanger, [place] notified.  Instructed that new order for mag place.  Will continue to monitor.  Karie Kirks, Therapist, sports.

## 2015-12-28 NOTE — Progress Notes (Signed)
Patient c/o heaviness to chest and fatigue since she took coreg, v/s stable, 12 lead ekg done. PA notified, PA came to see patient,. See new order for medication change. Will continue to monitor patient.

## 2015-12-28 NOTE — Clinical Social Work Note (Signed)
CSW met with patient. Daughter-in-law at bedside. They accept bed offer at [facility]. Patient's daughter-in-law will likely go to tour today. CSW sent message to admissions coordinator, Claiborne Billings, making her aware. Unknown at this time when patient will discharge. MD still wants to diurese. Will start insurance authorization once discharge date is confirmed.  Bianca [name], [place]

## 2015-12-28 NOTE — Progress Notes (Signed)
  Paged for progressive fatigue and malaise since given coreg this morning.    Pt has not been out of bed.   Feels profoundly fatigued with mild chest heaviness since getting coreg this morning. Similar to her previously described "dyspnea attacks.". Feels the same as when she was tried on Coreg over the weekend.    Previously had reduced EF due to NICM. (It appears in chart that most recent ischemic work up may have been as far back as 2008). Recent Echo showed EF improved to 50-55%. Troponins negative on admit.  EKG unremarkable.  Had 4 beats NSVT at lunch and ~30 beats earlier this morning.  K and Mg supped.  Nothing further of interest on tele.   Will hold Coreg this evening and watch for improvement.  ? If she would tolerate Toprol or bisoprolol better. Would like to try and get on some BB for NSVT and episodes of VT in June.    Legrand Como [address] [place], PA-C 12/28/2015 3:31 PM

## 2015-12-29 LAB — BASIC METABOLIC PANEL
ANION GAP: 11 (ref 5–15)
BUN: 27 mg/dL — ABNORMAL HIGH (ref 6–20)
CALCIUM: 9.1 mg/dL (ref 8.9–10.3)
CHLORIDE: 88 mmol/L — AB (ref 101–111)
CO2: 37 mmol/L — AB (ref 22–32)
Creatinine, Ser: 0.84 mg/dL (ref 0.44–1.00)
GFR calc Af Amer: >60 mL/min (ref >60)
GFR calc non Af Amer: >60 mL/min (ref >60)
GLUCOSE: 107 mg/dL — AB (ref 65–99)
Potassium: 3 mmol/L — ABNORMAL LOW (ref 3.5–5.1)
Sodium: 136 mmol/L (ref 135–145)

## 2015-12-29 LAB — CBC WITH DIFFERENTIAL/PLATELET
BASOS PCT: 0 %
Basophils Absolute: 0 10*3/uL (ref 0.0–0.1)
Eosinophils Absolute: 0.3 10*3/uL (ref 0.0–0.7)
Eosinophils Relative: 4 %
HEMATOCRIT: 41.3 % (ref 36.0–46.0)
Hemoglobin: 12.8 g/dL (ref 12.0–15.0)
LYMPHS ABS: 1.4 10*3/uL (ref 0.7–4.0)
Lymphocytes Relative: 23 %
MCH: 29.4 pg (ref 26.0–34.0)
MCHC: 31 g/dL (ref 30.0–36.0)
MCV: 94.9 fL (ref 78.0–100.0)
MONO ABS: 0.6 10*3/uL (ref 0.1–1.0)
MONOS PCT: 10 %
NEUTROS ABS: 3.8 10*3/uL (ref 1.7–7.7)
Neutrophils Relative %: 63 %
Platelets: 244 10*3/uL (ref 150–400)
RBC: 4.35 MIL/uL (ref 3.87–5.11)
RDW: 16.3 % — AB (ref 11.5–15.5)
WBC: 6.1 10*3/uL (ref 4.0–10.5)

## 2015-12-29 LAB — MAGNESIUM: Magnesium: 2 mg/dL (ref 1.7–2.4)

## 2015-12-29 MED ORDER — BISOPROLOL FUMARATE 5 MG PO TABS
2.5000 mg | ORAL_TABLET | Freq: Every day | ORAL | Status: DC
  Administered 2015-12-29 – 2015-12-31 (×3): 2.5 mg via ORAL
  Filled 2015-12-29 (×3): qty 1

## 2015-12-29 MED ORDER — BISOPROLOL FUMARATE 5 MG PO TABS: 2.5000 mg | ORAL_TABLET | Freq: Every day | ORAL | Status: DC

## 2015-12-29 MED ORDER — POTASSIUM CHLORIDE CRYS ER 20 MEQ PO TBCR
40.0000 meq | EXTENDED_RELEASE_TABLET | Freq: Once | ORAL | Status: AC
Start: 2015-12-29 — End: 2015-12-29
  Administered 2015-12-29: 40 meq via ORAL
  Filled 2015-12-29: qty 2

## 2015-12-29 MED ORDER — POTASSIUM CHLORIDE CRYS ER 20 MEQ PO TBCR
40.0000 meq | EXTENDED_RELEASE_TABLET | Freq: Two times a day (BID) | ORAL | Status: AC
  Administered 2015-12-29 – 2015-12-30 (×4): 40 meq via ORAL
  Filled 2015-12-29 (×4): qty 2

## 2015-12-29 MED ORDER — METOLAZONE 2.5 MG PO TABS
2.5000 mg | ORAL_TABLET | Freq: Once | ORAL | Status: AC
  Administered 2015-12-29: 2.5 mg via ORAL
  Filled 2015-12-29: qty 1

## 2015-12-29 NOTE — Progress Notes (Signed)
Advanced Heart Failure Rounding Note  Referring Physician: Dr. Posey Pronto Primary Physician: Maurice Small, MD Primary Cardiologist: Dr Marlou Porch Primary HF: Dr. Haroldine Laws   Reason for Consultation: A/C diastolic CHF  Subjective:    Admitted 12/21/15 after "dyspnea attack" despite diuretics being increased in HF clinic earlier that day.  Given 40 mg IV lasix in ED, but then switched back to po diuretics.  Weight began to climb and placed back on IV lasix, but in setting BUN rising and hypotension this was again held and 500 cc of fluid given.   Had episode of profound fatigue and chest heaviness after being started back on coreg yesterday, similar to episode this weekend. Feels much better this morning.  Kept falling asleep last night with company in room.  Denies SOB this morning. No CP or further chest heaviness.   Out 2.9 L and down another 5 lbs with IV lasix 80 mg BID with 2.5 mg metolazone again yesterday. Creatinine stable. K 3.0.  Hgb stable.   Objective:   Weight Range: 226 lb 1.6 oz (102.558 kg) Body mass index is 36.51 kg/(m^2).   Vital Signs:   Temp:  [97.7 F (36.5 C)-98.6 F (37 C)] 98 F (36.7 C) (07/13 0537) Pulse Rate:  [85-97] 86 (07/13 0537) Resp:  [16-19] 19 (07/13 0537) BP: (101-119)/(59-77) 101/60 mmHg (07/13 0537) SpO2:  [90 %-98 %] 90 % (07/13 0537) Weight:  [226 lb 1.6 oz (102.558 kg)] 226 lb 1.6 oz (102.558 kg) (07/13 0537) Last BM Date: 12/27/15  Weight change: Filed Weights   12/27/15 0409 12/28/15 0353 12/29/15 0537  Weight: 235 lb 9.6 oz (106.867 kg) 231 lb 11.2 oz (105.098 kg) 226 lb 1.6 oz (102.558 kg)    Intake/Output:   Intake/Output Summary (Last 24 hours) at 12/29/15 0717 Last data filed at 12/29/15 0302  Gross per 24 hour  Intake   1010 ml  Output   3450 ml  Net  -2440 ml     Physical Exam: General: Elderly appearing. NAD.  HEENT: normal,  Neck: supple. JVP 8-9 with HJR . Carotids 2+ bilat; no bruits. No thyromegaly or nodule  noted.  Cor: PMI nondisplaced. RRR, no M/G/R Lungs: Scant basilar crackles, otherwise clear. On 2 liters [place].  Abdomen: obese soft, ND. Organized R flank hematoma without ecchymosis. No HSM. No bruits or masses. +BS Extremities: no cyanosis, clubbing, rash, no ankle edema. Neuro: alert & orientedx3, cranial nerves grossly intact. moves all 4 extremities w/o difficulty. Affect pleasant  Telemetry: Reviewed personally,  A-sensed V-paced 90s  Labs: CBC  Recent Labs  12/27/15 0253 12/28/15 0320 12/29/15 0525  WBC 8.8 7.7 6.1  NEUTROABS 5.3  --  3.8  HGB 13.5 12.9 12.8  HCT 42.3 41.2 41.3  MCV 94.2 95.6 94.9  PLT 224 234 [id number]   Basic Metabolic Panel  Recent Labs  12/26/15 0905  12/28/15 0320 12/29/15 0525  NA 134*  < > 138 136  K 3.9  < > 3.4* 3.0*  CL 94*  < > 94* 88*  CO2 29  < > 31 37*  GLUCOSE 170*  < > 91 107*  BUN 31*  < > 26* 27*  CREATININE 1.30*  < > 0.83 0.84  CALCIUM 9.1  < > 8.9 9.1  MG 2.6*  --  1.8  --   < > = values in this interval not displayed. Liver Function Tests No results for input(s): AST, ALT, ALKPHOS, BILITOT, PROT, ALBUMIN in the last 72 hours. No results  for input(s): LIPASE, AMYLASE in the last 72 hours. Cardiac Enzymes No results for input(s): CKTOTAL, CKMB, CKMBINDEX, TROPONINI in the last 72 hours.  BNP: BNP (last 3 results)  Recent Labs  11/02/15 0736 11/24/15 0249 12/21/15 1839  BNP 143.9* 801.6* 948.0*    ProBNP (last 3 results) No results for input(s): PROBNP in the last 8760 hours.   D-Dimer No results for input(s): DDIMER in the last 72 hours. Hemoglobin A1C No results for input(s): HGBA1C in the last 72 hours. Fasting Lipid Panel No results for input(s): CHOL, HDL, LDLCALC, TRIG, CHOLHDL, LDLDIRECT in the last 72 hours. Thyroid Function Tests No results for input(s): TSH, T4TOTAL, T3FREE, THYROIDAB in the last 72 hours.  Invalid input(s): FREET3  Other results:     Imaging/Studies:  No results  found.  Latest Echo  Latest Cath   Medications:     Scheduled Medications: . acetaminophen  650 mg Oral Q8H  . apixaban  5 mg Oral BID  . cefTRIAXone (ROCEPHIN)  IV  1 g Intravenous Q24H  . furosemide  80 mg Intravenous BID  . gabapentin  800 mg Oral TID  . guaiFENesin  600 mg Oral BID  . lidocaine  1 patch Transdermal Q24H  . nortriptyline  25 mg Oral QHS  . polyethylene glycol  17 g Oral Daily  . senna-docusate  1 tablet Oral BID  . sodium chloride  1 spray Each Nare Daily    Infusions:    PRN Medications: albuterol, alum & mag hydroxide-simeth   Assessment/Plan   1. Acute on chronic diastolic heart failure (recovered LVEF after CRT) NICM EF 35% s/p BiV ICD - [employer]. -> EF 50-55% on echo 10/2015. Clinically hypervolemic. Multiple HF meds stopped after flank hemorrhage, recently restarted on carvedilol, which could have lead to transient decompensation.  - Have tried Coreg twice for NSVT and both times pt has had profound fatigue/chest heaviness.  Will try 2.5 mg bisoprolol at bedtime as last attempt at getting BB on board.  - Volume status improving. Continue lasix 80 mg IV BID for today with 2.5 mg metolazone this morning with continued diuresis.  Likely switch to po for home in am.  - Will use 60 mg torsemide daily for home/SNF once transitioned to po meds.  - Creatinine remains stable.  2. Paroxysmal AF - CHA2DS2-VASc 6, off anticoagulation due to hematoma with very delayed resolution. - Started on eliquis 5 mg BID 12/26/15.  CBC stable. 3. Morbid obesity with h/o CO2 retention  - Clearly contributing to her dyspnea. She is markedly deconditioned.  To SNF on d/c. 4. R Flank Hematoma - Seen by surgery 12/25/15.  Organized flank hematoma. No role for surgery.  - Started on Eliquis 12/26/15. Remains stable thus far.  5. UTI - on antibiotics, contaminated culture. 6. AKI - Resolved despite increasing diuretics.  - Creatinine remains stable.  7. Epistaxis - No  further with humidifier added to 02.  - Continue to follow closely as just started on Eliquis.  8. VT on ICD interrogation - Goal K > 3.9 and Mg > 1.8 per EP.   - EP to see as outpatient to schedule generator change as patient is at [facility] as of 12/22/15 - Got 2 g Mg for level of 1.8 yesterday. Re-check today pending.  9. Hypokalemia - K 3.0 this morning with aggressive diuresis.   - Increase K supp.   Disposition: Continue to diurese today. Likely to SNF tomorrow.   Length of Stay: 6  Legrand Como  Joesph July PA-C 12/29/2015, 7:17 AM  Advanced Heart Failure Team Pager [phone] (M-F; 7a - 4p)  Please contact [facility] for night-coverage after hours (4p -7a ) and weekends on [email or web address]  Patient seen with PA, agree with the above note.  She was unable to tolerate low dose Coreg yesterday, profound fatigue.  She continued to diurese well but still with some volume overload.  Will continue one more day of IV diuretics, transtion hopefully tomorrow to torsemide 60 mg daily.  Will need SNF.   Loralie Champagne 12/29/2015 3:51 PM

## 2015-12-29 NOTE — Care Management Important Message (Signed)
Important Message  Patient Details  Name: Bianca [name] MRN: [id number] Date of Birth: [birth year]   Medicare Important Message Given:  Yes    Loann Quill 12/29/2015, 8:18 AM

## 2015-12-29 NOTE — Progress Notes (Signed)
[facility] Progress Note  Patient: Bianca [name] GHW:[id number]   PCP: Jonathon Bellows, MD DOB: 09/18/1932   DOA: 12/21/2015   DOS: 12/29/2015   Date of Service: the patient was seen and examined on 12/29/2015  Subjective: No new complaint. No worsening of SOB. No chest pain. She feels well. She is normally on O2 only at night but still on it here.   Brief hospital course: Pt. with PMH of ASCVD, OSA, security 3, chronic combined CHF mild HTN; admitted on 12/21/2015, with complaint of shortness of breath as well as recurrent palpitation, was found to have PVC as well as CHF exacerbation. Currently further plan is continue diuresis as well as monitor on telemetry.  Assessment and Plan: 1. Acute on chronic diastolic CHF - Continue IV diuresis. Likely DC on other loop diuretics than lasix.  Dyspnea Consulted cardiology, patient was given IV diuresis with Lasix, was held for some time due to low BP. We will continue to monitor the patient's volume as well as ins and outs. No evidence of PE as well. She refused VQ scan. No pneumonia. Initially started on Coreg and now on hold due to hypotension as well as patient's symptoms dizziness with that, will try bisoprolol per cardiology tonight. Crackles on exam and not at baseline O2 req. I suspect she needs another 24-48 hours of IV diuresis.  2. Hypokalemia:  Continue replacement, maintained K more than 4 magnesium more than 2  3. Frequent PVCs. Initially patient was resumed on Coreg, currently Holding Coreg due to patient's symptoms as well as hypotension  4. Neuropathy:  Continue gabapentin and nortriptyline  5. pAF:  CHADS2Vasc 6.  anticoagulation was held due to hematoma. Surgery evaluated the patient and recommended patient can be started back on anticoagulation. Was restarted 7/10 and doing okay.  6. OSA on CPAP:  -Continue CPAP therapy  7. CKD III:  Stable, continue to monitor  8. Abdominal wall hematoma. Appears stable  hemoglobin stable. H&H remains stable, it will likely resolve on its own.  back on anti-coagulation. Per cardiology.  9. Subclinical hypothyroidism. TSH is elevated but free T4 is normal. Patient complains of generalized fatigue. Based on this evaluation at present it is not recommended to start the patient on T4 replacement. Recommend to continue to monitor as an outpatient.  10. Generalized fatigue and sense of not feeling well. UTI ESR normal V-89 normal folic acid normal hemoglobin stable no evidence of active infection. LFTs are normal. At present I do not have any acute etiology. Patient is being treated for UTI with IV ceftriaxone last day 12/28/2015. although I do not feel that the current treatment for the UTI will also make the patient feel better, as This might be patient's new baseline.  Pain management: when necessary Tylenol Activity: consulted physical therapy, recommend SNF Bowel regimen: last BM 12/27/2015 Diet: cardiac diet DVT Prophylaxis: Therapeutic anticoagulation  Advance goals of care discussion: full  Family Communication: family was present at bedside, at the time of interview.   Disposition:  Discharge to SNF Expected discharge date: 07/14 or 15/2017, improvement in oxygenation and overload  Consultants: Cardiology, general surgery Procedures: none  Antibiotics: Anti-infectives    Start     Dose/Rate Route Frequency Ordered Stop   12/24/15 1500  cefTRIAXone (ROCEPHIN) 1 g in dextrose 5 % 50 mL IVPB     1 g 100 mL/hr over 30 Minutes Intravenous Every 24 hours 12/24/15 1341          Intake/Output Summary (  Last 24 hours) at 12/29/15 1057 Last data filed at 12/29/15 1012  Gross per 24 hour  Intake    960 ml  Output   3500 ml  Net  -2540 ml   Filed Weights   12/27/15 0409 12/28/15 0353 12/29/15 0537  Weight: 106.867 kg (235 lb 9.6 oz) 105.098 kg (231 lb 11.2 oz) 102.558 kg (226 lb 1.6 oz)    Objective: Physical Exam: Filed Vitals:    12/28/15 1448 12/28/15 2212 12/28/15 2302 12/29/15 0537  BP: 105/59 113/59  101/60  Pulse: 87 85 88 86  Temp: 98.3 F (36.8 C) 97.7 F (36.5 C)  98 F (36.7 C)  TempSrc: Oral Oral  Oral  Resp:  17 16 19   Height:      Weight:    102.558 kg (226 lb 1.6 oz)  SpO2: 98% 96% 95% 90%   General: Alert, Awake and Oriented to Time, Place and Person. Appear in mild distress Eyes: PERRL, Conjunctiva normal ENT: Oral Mucosa clear moist. Neck: no JVD, no Abnormal Mass Or lumps Cardiovascular: S1 and S2 Present, aortic systolic Murmur, Respiratory: Bilateral Air entry equal and Decreased, Clear to Auscultation, basilar Crackles, no wheezes Abdomen: Bowel Sound persent, Soft and no tenderness, No CVA tenderness Skin: no redness, no Rash  Extremities: no Pedal edema, no calf tenderness Neurologic: Grossly no focal neuro deficit. Bilaterally Equal motor strength  Data Reviewed: CBC:  Recent Labs Lab 12/25/15 1212 12/26/15 0905 12/27/15 0253 12/28/15 0320 12/29/15 0525  WBC 10.4 9.8 8.8 7.7 6.1  NEUTROABS 6.3  --  5.3  --  3.8  HGB 12.4 12.6 13.5 12.9 12.8  HCT 39.6 40.1 42.3 41.2 41.3  MCV 95.7 94.8 94.2 95.6 94.9  PLT 276 271 224 234 612   Basic Metabolic Panel:  Recent Labs Lab 12/24/15 0418 12/25/15 0906 12/26/15 0905 12/27/15 0253 12/27/15 1201 12/28/15 0320 12/29/15 0525  NA 136 134*  134* 134* 134* 137 138 136  K 3.9 3.9  3.9 3.9 4.9 3.3* 3.4* 3.0*  CL 98* 96*  97* 94* 98* 93* 94* 88*  CO2 28 28  28 29 22  35* 31 37*  GLUCOSE 126* 169*  168* 170* 95 173* 91 107*  BUN 25* 33*  33* 31* 29* 26* 26* 27*  CREATININE 1.22* 1.25*  1.25* 1.30* 0.86 0.96 0.83 0.84  CALCIUM 9.1 8.9  9.0 9.1 8.6* 8.9 8.9 9.1  MG 2.0 2.1 2.6*  --   --  1.8 2.0  PHOS  --  4.0  --   --   --   --   --     Liver Function Tests:  Recent Labs Lab 12/25/15 0906  ALBUMIN 3.5   No results for input(s): LIPASE, AMYLASE in the last 168 hours. No results for input(s): AMMONIA in the last  168 hours. Coagulation Profile: No results for input(s): INR, PROTIME in the last 168 hours. Cardiac Enzymes:  Recent Labs Lab 12/22/15 1241 12/22/15 1843 12/23/15 0125  TROPONINI 0.06* 0.05* 0.04*   BNP (last 3 results) No results for input(s): PROBNP in the last 8760 hours.  CBG: No results for input(s): GLUCAP in the last 168 hours.  Studies: No results found.   Scheduled Meds: . acetaminophen  650 mg Oral Q8H  . apixaban  5 mg Oral BID  . bisoprolol  2.5 mg Oral QHS  . cefTRIAXone (ROCEPHIN)  IV  1 g Intravenous Q24H  . furosemide  80 mg Intravenous BID  . gabapentin  800 mg  Oral TID  . guaiFENesin  600 mg Oral BID  . lidocaine  1 patch Transdermal Q24H  . nortriptyline  25 mg Oral QHS  . polyethylene glycol  17 g Oral Daily  . potassium chloride  40 mEq Oral BID  . potassium chloride  40 mEq Oral Once  . senna-docusate  1 tablet Oral BID  . sodium chloride  1 spray Each Nare Daily   Continuous Infusions:   PRN Meds: albuterol, alum & mag hydroxide-simeth  Time spent: 32 minutes  Author: Berle Mull, MD [facility] Pager: [phone] 12/29/2015 10:57 AM  If 7PM-7AM, please contact night-coverage at [email or web address], password [facility]

## 2015-12-29 NOTE — Progress Notes (Signed)
[place] for Eliquis Indication: atrial fibrillation  Allergies  Allergen Reactions  . Quinapril Hcl Swelling    Tongue and throat  . Tape Other (See Comments)    Plastic tape causes irritation.   . Neosporin [Neomycin-Polymyxin-Gramicidin] Rash    Patient Measurements: Height: 5\' 6"  (167.6 cm) Weight: 226 lb 1.6 oz (102.558 kg) (Scale C) IBW/kg (Calculated) : 59.3   Vital Signs: Temp: 97.5 F (36.4 C) (07/13 1235) Temp Source: Oral (07/13 1235) BP: 97/66 mmHg (07/13 1235) Pulse Rate: 88 (07/13 1235)  Labs:  Recent Labs  12/27/15 0253 12/27/15 1201 12/28/15 0320 12/29/15 0525  HGB 13.5  --  12.9 12.8  HCT 42.3  --  41.2 41.3  PLT 224  --  234 244  CREATININE 0.86 0.96 0.83 0.84    Estimated Creatinine Clearance: 63.5 mL/min (by C-G formula based on Cr of 0.84).  Assessment: Admitted 12/21/15 after "dyspnea attack" despite diuretics being increased in HF clinic earlier that day. Admited with flank hematoma after fall.  Anticoagulation: PAF with CHADS2VASC 6 off anticoagulation due to hematoma with very delayed resolution, resumed 7/10.  CBC has remained stable and no bleeding issues were noted overnight. Scr now normal and stable.   Goal of Therapy:  Therapeutic oral anticoagulation Monitor platelets by anticoagulation protocol: Yes   Plan:  Eliquis 5mg  Bid for PAF No dose adjustments expected at this time. Pharmacy to sign off and follow peripherally  Erin Hearing PharmD., BCPS Clinical Pharmacist Pager [phone] 12/29/2015 12:57 PM

## 2015-12-29 NOTE — Progress Notes (Signed)
Physical Therapy Treatment Patient Details Name: Bianca [name] MRN: [id number] DOB: Aug 28, 1932 Today's Date: 12/29/2015    History of Present Illness 82 yo female with onset of SOB and hypoxemia with CO2 retention was cleared for mult pulm issues, decided she has CO2 retention with worsened sleep apnea.  PMHx:  neuropathy, CHF, CKD 3    PT Comments    Patient remains limited due to LE fatigue, but seems less SOB this session.  Continue to feel she will need STSNF rehab prior to d/c to ALF versus ILF.  Follow Up Recommendations  SNF     Equipment Recommendations  None recommended by PT    Recommendations for Other Services       Precautions / Restrictions Precautions Precautions: Fall    Mobility  Bed Mobility               General bed mobility comments: up on [facility]  Transfers   Equipment used: Rolling walker (2 wheeled) Transfers: Sit to/from [facility] Sit to Stand: Min guard;Min assist Stand pivot transfers: Min guard       General transfer comment: up from chair and BSC increased time and assist for forward weight shift, increaed time stand pivot to chair from [facility] with walker in small space  Ambulation/Gait Ambulation/Gait assistance: Min guard Ambulation Distance (Feet): 150 Feet Assistive device: Rolling walker (2 wheeled) Gait Pattern/deviations: Step-through pattern;Decreased stride length;Trunk flexed     General Gait Details: min c/o LE fatighe at end of ambulation, slower pace than Tuesday   Stairs            Wheelchair Mobility    Modified Rankin (Stroke Patients Only)       Balance Overall balance assessment: Needs assistance   Sitting balance-Leahy Scale: Good       Standing balance-Leahy Scale: Poor                      Cognition Arousal/Alertness: Awake/alert Behavior During Therapy: WFL for tasks assessed/performed Overall Cognitive Status: Within Functional Limits for tasks assessed                      Exercises General Exercises - Lower Extremity Long Arc Quad: AROM;Both;10 reps;Seated Hip Flexion/Marching: AROM;Both;10 reps;Seated Toe Raises: AROM;Both;10 reps;Seated Heel Raises: AROM;Both;10 reps;Seated    General Comments General comments (skin integrity, edema, etc.): Daughter in law stating plan is to go to [facility].  States pt with more fluid off.      Pertinent Vitals/Pain Faces Pain Scale: No hurt    Home Living                      Prior Function            PT Goals (current goals can now be found in the care plan section) Progress towards PT goals: Progressing toward goals    Frequency  Min 3X/week    PT Plan Current plan remains appropriate    Co-evaluation             End of Session Equipment Utilized During Treatment: Gait belt;Oxygen Activity Tolerance: Patient limited by fatigue Patient left: in chair;with call bell/phone within reach;with family/visitor present     Time: [id number] PT Time Calculation (min) (ACUTE ONLY): 28 min  Charges:  $Gait Training: 8-22 mins $Therapeutic Exercise: 8-22 mins                    G  Codes:      [name] 12/29/2015, 4:51 PM  [name], [place] 12/29/2015

## 2015-12-30 DIAGNOSIS — I5023 Acute on chronic systolic (congestive) heart failure: Secondary | ICD-10-CM | POA: Insufficient documentation

## 2015-12-30 LAB — CBC WITH DIFFERENTIAL/PLATELET
BASOS ABS: 0 10*3/uL (ref 0.0–0.1)
BASOS PCT: 0 %
EOS ABS: 0.3 10*3/uL (ref 0.0–0.7)
Eosinophils Relative: 4 %
HCT: 43.8 % (ref 36.0–46.0)
HEMOGLOBIN: 13.1 g/dL (ref 12.0–15.0)
Lymphocytes Relative: 28 %
Lymphs Abs: 1.8 10*3/uL (ref 0.7–4.0)
MCH: 28.7 pg (ref 26.0–34.0)
MCHC: 29.9 g/dL — AB (ref 30.0–36.0)
MCV: 96.1 fL (ref 78.0–100.0)
MONOS PCT: 10 %
Monocytes Absolute: 0.7 10*3/uL (ref 0.1–1.0)
NEUTROS PCT: 58 %
Neutro Abs: 3.7 10*3/uL (ref 1.7–7.7)
Platelets: 257 10*3/uL (ref 150–400)
RBC: 4.56 MIL/uL (ref 3.87–5.11)
RDW: 16.4 % — ABNORMAL HIGH (ref 11.5–15.5)
WBC: 6.5 10*3/uL (ref 4.0–10.5)

## 2015-12-30 LAB — BASIC METABOLIC PANEL
Anion gap: 9 (ref 5–15)
BUN: 25 mg/dL — ABNORMAL HIGH (ref 6–20)
CALCIUM: 9.2 mg/dL (ref 8.9–10.3)
CHLORIDE: 87 mmol/L — AB (ref 101–111)
CO2: 41 mmol/L — AB (ref 22–32)
Creatinine, Ser: 1.02 mg/dL — ABNORMAL HIGH (ref 0.44–1.00)
GFR calc Af Amer: 58 mL/min — ABNORMAL LOW (ref >60)
GFR, EST NON AFRICAN AMERICAN: 50 mL/min — AB (ref >60)
GLUCOSE: 117 mg/dL — AB (ref 65–99)
Potassium: 3.4 mmol/L — ABNORMAL LOW (ref 3.5–5.1)
Sodium: 137 mmol/L (ref 135–145)

## 2015-12-30 LAB — MAGNESIUM: MAGNESIUM: 2 mg/dL (ref 1.7–2.4)

## 2015-12-30 MED ORDER — TORSEMIDE 20 MG PO TABS
60.0000 mg | ORAL_TABLET | Freq: Every day | ORAL | Status: DC
  Administered 2015-12-30 – 2016-01-01 (×3): 60 mg via ORAL
  Filled 2015-12-30 (×3): qty 3

## 2015-12-30 MED ORDER — TORSEMIDE 20 MG PO TABS: 60.0000 mg | ORAL_TABLET | Freq: Every day | ORAL | Status: DC

## 2015-12-30 MED ORDER — SENNOSIDES-DOCUSATE SODIUM 8.6-50 MG PO TABS: 1.0000 | ORAL_TABLET | Freq: Two times a day (BID) | ORAL | Status: DC

## 2015-12-30 MED ORDER — BISOPROLOL FUMARATE 5 MG PO TABS: 2.5000 mg | ORAL_TABLET | Freq: Every day | ORAL | Status: DC

## 2015-12-30 MED ORDER — APIXABAN 5 MG PO TABS: 5.0000 mg | ORAL_TABLET | Freq: Two times a day (BID) | ORAL | Status: DC

## 2015-12-30 MED ORDER — LIDOCAINE 5 % EX PTCH: 1.0000 | MEDICATED_PATCH | CUTANEOUS | Status: DC

## 2015-12-30 NOTE — Clinical Social Work Note (Addendum)
Insurance authorization started for SNF. Plan is for discharge tomorrow.  Dayton Scrape, [place] [phone]  2:25 pm Authorization #: [id number]  Dayton Scrape, [place]

## 2015-12-30 NOTE — Progress Notes (Addendum)
Advanced Heart Failure Rounding Note  Referring Physician: Dr. Posey Pronto Primary Physician: Maurice Small, MD Primary Cardiologist: Dr Marlou Porch Primary HF: Dr. Haroldine Laws   Reason for Consultation: A/C diastolic CHF  Subjective:    Admitted 12/21/15 after "dyspnea attack" despite diuretics being increased in HF clinic earlier that day.  Given 40 mg IV lasix in ED, but then switched back to po diuretics.  Weight began to climb and placed back on IV lasix, but in setting BUN rising and hypotension this was again held and 500 cc of fluid given.   Feels better this morning. No further SOB or chest heaviness.  No profound fatigue with bisoprolol.  Actually didn't sleep very much last night.  States "I have a night like this every one and awhile.". No lightheadedness or dizziness, but not very active.   Out 2.6L and weight shows down 10 lbs. Out 10 L and 22 lbs overall. Creatinine 0.8 => 1.0. K 3.4  Objective:   Weight Range: 216 lb 6.4 oz (98.158 kg) Body mass index is 34.94 kg/(m^2).   Vital Signs:   Temp:  [97.5 F (36.4 C)-98 F (36.7 C)] 98 F (36.7 C) (07/14 0628) Pulse Rate:  [82-88] 83 (07/14 0628) Resp:  [18-19] 18 (07/14 0628) BP: (95-103)/(53-66) 103/62 mmHg (07/14 0628) SpO2:  [95 %-99 %] 97 % (07/14 0628) Weight:  [216 lb 6.4 oz (98.158 kg)] 216 lb 6.4 oz (98.158 kg) (07/14 0342) Last BM Date: 12/29/15  Weight change: Filed Weights   12/28/15 0353 12/29/15 0537 12/30/15 0342  Weight: 231 lb 11.2 oz (105.098 kg) 226 lb 1.6 oz (102.558 kg) 216 lb 6.4 oz (98.158 kg)    Intake/Output:   Intake/Output Summary (Last 24 hours) at 12/30/15 0718 Last data filed at 12/30/15 0500  Gross per 24 hour  Intake    910 ml  Output   3700 ml  Net  -2790 ml     Physical Exam: General: Elderly appearing. NAD.  HEENT: normal,  Neck: supple. JVP 6-7. Carotids 2+ bilat; no bruits. No thyromegaly or lymphadenopathy noted. Cor: PMI nondisplaced. RRR, no M/G/R Lungs: CTAB, normal  effort Abdomen: obese, soft, non-distended. Organized R flank hematoma without ecchymosis. No HSM. No bruits or masses. +BS Extremities: no cyanosis, clubbing, rash, no ankle edema. Neuro: alert & orientedx3, cranial nerves grossly intact. moves all 4 extremities w/o difficulty. Affect pleasant  Telemetry: Reviewed personally,  A-sensed V-paced 90s, Occasional 4-5 beat NSVT  Labs: CBC  Recent Labs  12/29/15 0525 12/30/15 0420  WBC 6.1 6.5  NEUTROABS 3.8 3.7  HGB 12.8 13.1  HCT 41.3 43.8  MCV 94.9 96.1  PLT 244 [id number]   Basic Metabolic Panel  Recent Labs  12/29/15 0525 12/30/15 0420  NA 136 137  K 3.0* 3.4*  CL 88* 87*  CO2 37* 41*  GLUCOSE 107* 117*  BUN 27* 25*  CREATININE 0.84 1.02*  CALCIUM 9.1 9.2  MG 2.0 2.0   Liver Function Tests No results for input(s): AST, ALT, ALKPHOS, BILITOT, PROT, ALBUMIN in the last 72 hours. No results for input(s): LIPASE, AMYLASE in the last 72 hours. Cardiac Enzymes No results for input(s): CKTOTAL, CKMB, CKMBINDEX, TROPONINI in the last 72 hours.  BNP: BNP (last 3 results)  Recent Labs  11/02/15 0736 11/24/15 0249 12/21/15 1839  BNP 143.9* 801.6* 948.0*    ProBNP (last 3 results) No results for input(s): PROBNP in the last 8760 hours.   D-Dimer No results for input(s): DDIMER in the last 72  hours. Hemoglobin A1C No results for input(s): HGBA1C in the last 72 hours. Fasting Lipid Panel No results for input(s): CHOL, HDL, LDLCALC, TRIG, CHOLHDL, LDLDIRECT in the last 72 hours. Thyroid Function Tests No results for input(s): TSH, T4TOTAL, T3FREE, THYROIDAB in the last 72 hours.  Invalid input(s): FREET3  Other results:  Imaging/Studies:  No results found.  Latest Echo  Latest Cath   Medications:     Scheduled Medications: . acetaminophen  650 mg Oral Q8H  . apixaban  5 mg Oral BID  . bisoprolol  2.5 mg Oral QHS  . cefTRIAXone (ROCEPHIN)  IV  1 g Intravenous Q24H  . furosemide  80 mg Intravenous BID    . gabapentin  800 mg Oral TID  . guaiFENesin  600 mg Oral BID  . lidocaine  1 patch Transdermal Q24H  . nortriptyline  25 mg Oral QHS  . polyethylene glycol  17 g Oral Daily  . potassium chloride  40 mEq Oral BID  . senna-docusate  1 tablet Oral BID  . sodium chloride  1 spray Each Nare Daily    Infusions:    PRN Medications: albuterol, alum & mag hydroxide-simeth   Assessment/Plan   1. Acute on chronic diastolic heart failure (recovered LVEF after CRT) NICM EF 35% s/p BiV ICD - [employer]. -> EF 50-55% on echo 10/2015. Clinically hypervolemic. Multiple HF meds stopped after flank hemorrhage, recently restarted on carvedilol, which could have lead to transient decompensation.  - Continue 2.5 mg bisoprolol.  Pt intolerant to coreg with profound fatigue.  - Volume status looks good today. Will transition to 60 mg torsemide daily for home/SNF  - Creatinine remains stable.  2. Paroxysmal AF - CHA2DS2-VASc 6, off anticoagulation due to hematoma with very delayed resolution. - Started on eliquis 5 mg BID 12/26/15.  CBC stable. 3. Morbid obesity with h/o CO2 retention  - Clearly contributing to her dyspnea. She is markedly deconditioned.  To SNF on d/c. 4. R Flank Hematoma - Seen by surgery 12/25/15.  Organized flank hematoma. No role for surgery.  - Started on Eliquis 12/26/15. Remains stable thus far.  5. UTI - on antibiotics, contaminated culture. 6. AKI - Resolved. - Creatinine 0.8 -> 1.0. 7. Epistaxis - No further with humidifier added to 02.  - Continue to follow closely as just started on Eliquis.  8. VT on ICD interrogation - Did not tolerate coreg, but no problem so far with low dose bisoprolol.  Will continue bisoprolol 2.5 mg qhs.  - Goal K > 3.9 and Mg > 1.8 per EP.   - EP to see as outpatient to schedule generator change as patient is at [facility] as of 12/22/15 - Mg 2.0 today. Switching to po diuretics.  9. Hypokalemia - K 3.4 this morning. Will supp.  - Switching to po  diuretics.     Disposition: Switch to po diuretics. To SNF tomorrow if stable. Will schedule for HF follow up.   HF meds for SNF tomorrow Bisoprolol 2.5 mg QHS Torsemide 60 mg daily Potassium 40 meq BID Eliquis 5 mg BID  Should get BMET next week and have sent to HF clinic at [phone].  Length of Stay: 7  Shirley Friar PA-C 12/30/2015, 7:18 AM  Advanced Heart Failure Team Pager [phone] (M-F; 7a - 4p)  Please contact [facility] for night-coverage after hours (4p -7a ) and weekends on [email or web address]  Patient seen with PA, agree with the above note.  She tolerated bisoprolol last night without problems.  She looks near-euvolemic today.  She is now on torsemide 60 mg daily.  Plan for SNF tomorrow.  Family would like Optivol checked prior to discharge, I will arrange.   Loralie Champagne 12/30/2015 4:23 PM

## 2015-12-30 NOTE — Progress Notes (Signed)
[facility] Progress Note  Patient: Bianca [name] [id number]   PCP: No primary care provider on file. DOB: [birth year]   DOA: 12/21/2015   DOS: 12/30/2015   Date of Service: the patient was seen and examined on 12/30/2015  Subjective: No new complaint. No worsening of SOB. No chest pain. She feels well. She is normally on O2 only at night.  Brief hospital course: Pt. with PMH of ASCVD, OSA, security 3, chronic combined CHF mild HTN; admitted on 12/21/2015, with complaint of shortness of breath as well as recurrent palpitation, was found to have PVC as well as CHF exacerbation. Currently further plan is continue diuresis as well as monitor on telemetry. She is doing well, near euvolemia. Cardiology has adjusted medications to PO and can discharge to SNF in AM if doing well.  Assessment and Plan: 1. Acute on chronic diastolic CHF - Continue IV diuresis. Plan DC on Torsemide.  Dyspnea Consulted cardiology, patient was given IV diuresis with Lasix, was held for some time due to low BP. We will continue to monitor the patient's volume as well as ins and outs. No evidence of PE as well. She refused VQ scan. No pneumonia. Initially started on Coreg and now on hold due to hypotension as well as patient's symptoms dizziness with that, doing well on bisoprolol. Crackles on exam are improved today.  2. Hypokalemia:  Continue replacement, maintained K more than 4 magnesium more than 2  3. Frequent PVCs. Initially patient was resumed on Coreg, currently Holding Coreg due to patient's symptoms as well as hypotension  4. Neuropathy:  Continue gabapentin and nortriptyline  5. pAF:  CHADS2Vasc 6.  anticoagulation was held due to hematoma. Surgery evaluated the patient and recommended patient can be started back on anticoagulation. Was restarted 7/10 and doing okay.  6. OSA on CPAP:  -Continue CPAP therapy  7. CKD III:  Stable, continue to monitor  8. Abdominal wall  hematoma. Appears stable hemoglobin stable. H&H remains stable, it will likely resolve on its own.  back on anti-coagulation. Per cardiology.  9. Subclinical hypothyroidism. TSH is elevated but free T4 is normal. Patient complains of generalized fatigue. Based on this evaluation at present it is not recommended to start the patient on T4 replacement. Recommend to continue to monitor as an outpatient.  10. Generalized fatigue and sense of not feeling well. UTI ESR normal W-44 normal folic acid normal hemoglobin stable no evidence of active infection. LFTs are normal. At present I do not have any acute etiology. Patient is being treated for UTI with IV ceftriaxone last day 12/28/2015. although I do not feel that the current treatment for the UTI will also make the patient feel better, as This might be patient's new baseline.  Pain management: when necessary Tylenol Activity: consulted physical therapy, recommend SNF Bowel regimen: last BM 12/27/2015 Diet: cardiac diet DVT Prophylaxis: Therapeutic anticoagulation  Advance goals of care discussion: full  Family Communication: family was present at bedside, at the time of interview.   Disposition:  Discharge to SNF Expected discharge date: 12/31/2015, improvement in oxygenation and overload  Consultants: Cardiology, general surgery Procedures: none  Antibiotics: Anti-infectives    Start     Dose/Rate Route Frequency Ordered Stop   12/24/15 1500  cefTRIAXone (ROCEPHIN) 1 g in dextrose 5 % 50 mL IVPB     1 g 100 mL/hr over 30 Minutes Intravenous Every 24 hours 12/24/15 1341          Intake/Output Summary (Last 24 hours)  at 12/30/15 1158 Last data filed at 12/30/15 0600  Gross per 24 hour  Intake    770 ml  Output   3200 ml  Net  -2430 ml   Filed Weights   12/28/15 0353 12/29/15 0537 12/30/15 0342  Weight: 105.098 kg (231 lb 11.2 oz) 102.558 kg (226 lb 1.6 oz) 98.158 kg (216 lb 6.4 oz)    Objective: Physical  Exam: Filed Vitals:   12/29/15 2100 12/30/15 0025 12/30/15 0342 12/30/15 0628  BP: 101/58 95/53  103/62  Pulse: 88 82  83  Temp: 97.9 F (36.6 C) 97.9 F (36.6 C)  98 F (36.7 C)  TempSrc: Oral Oral  Oral  Resp: _0 Height:      Weight:   98.158 kg (216 lb 6.4 oz)   SpO2: 99% 95%  97%   General: Alert, Awake and Oriented to Time, Place and Person. Appear in mild distress Eyes: PERRL, Conjunctiva normal ENT: Oral Mucosa clear moist. Neck: no JVD, no Abnormal Mass Or lumps Cardiovascular: S1 and S2 Present, aortic systolic Murmur, Respiratory: Bilateral Air entry equal and Decreased, Clear to Auscultation, basilar Crackles improved today, no wheezes Abdomen: Bowel Sound persent, Soft and no tenderness, No CVA tenderness Skin: no redness, no Rash  Extremities: no Pedal edema, no calf tenderness Neurologic: Grossly no focal neuro deficit. Bilaterally Equal motor strength  Data Reviewed: CBC:  Recent Labs Lab 12/25/15 1212 12/26/15 0905 12/27/15 0253 12/28/15 0320 12/29/15 0525 12/30/15 0420  WBC 10.4 9.8 8.8 7.7 6.1 6.5  NEUTROABS 6.3  --  5.3  --  3.8 3.7  HGB 12.4 12.6 13.5 12.9 12.8 13.1  HCT 39.6 40.1 42.3 41.2 41.3 43.8  MCV 95.7 94.8 94.2 95.6 94.9 96.1  PLT 276 271 224 234 244 309   Basic Metabolic Panel:  Recent Labs Lab 12/25/15 0906 12/26/15 0905 12/27/15 0253 12/27/15 1201 12/28/15 0320 12/29/15 0525 12/30/15 0420  NA 134*  134* 134* 134* 137 138 136 137  K 3.9  3.9 3.9 4.9 3.3* 3.4* 3.0* 3.4*  CL 96*  97* 94* 98* 93* 94* 88* 87*  CO2 _1 35* 31 37* 41*  GLUCOSE 169*  168* 170* 95 173* 91 107* 117*  BUN 33*  33* 31* 29* 26* 26* 27* 25*  CREATININE 1.25*  1.25* 1.30* 0.86 0.96 0.83 0.84 1.02*  CALCIUM 8.9  9.0 9.1 8.6* 8.9 8.9 9.1 9.2  MG 2.1 2.6*  --   --  1.8 2.0 2.0  PHOS 4.0  --   --   --   --   --   --     Liver Function Tests:  Recent Labs Lab 12/25/15 0906  ALBUMIN 3.5   No results for input(s): LIPASE,  AMYLASE in the last 168 hours. No results for input(s): AMMONIA in the last 168 hours. Coagulation Profile: No results for input(s): INR, PROTIME in the last 168 hours. Cardiac Enzymes: No results for input(s): CKTOTAL, CKMB, CKMBINDEX, TROPONINI in the last 168 hours. BNP (last 3 results) No results for input(s): PROBNP in the last 8760 hours.  CBG: No results for input(s): GLUCAP in the last 168 hours.  Studies: No results found.   Scheduled Meds: . acetaminophen  650 mg Oral Q8H  . apixaban  5 mg Oral BID  . bisoprolol  2.5 mg Oral QHS  . cefTRIAXone (ROCEPHIN)  IV  1 g Intravenous Q24H  . gabapentin  800 mg Oral TID  . guaiFENesin  600 mg Oral BID  . lidocaine  1 patch Transdermal Q24H  . nortriptyline  25 mg Oral QHS  . polyethylene glycol  17 g Oral Daily  . potassium chloride  40 mEq Oral BID  . senna-docusate  1 tablet Oral BID  . sodium chloride  1 spray Each Nare Daily  . torsemide  60 mg Oral Daily   Continuous Infusions:   PRN Meds: albuterol, alum & mag hydroxide-simeth  Time spent: 32 minutes  Author: Berle Mull, MD [facility] Pager: [phone] 12/30/2015 11:58 AM  If 7PM-7AM, please contact night-coverage at [email or web address], password [facility]

## 2015-12-31 DIAGNOSIS — G5793 Unspecified mononeuropathy of bilateral lower limbs: Secondary | ICD-10-CM

## 2015-12-31 LAB — CBC WITH DIFFERENTIAL/PLATELET
BASOS ABS: 0 10*3/uL (ref 0.0–0.1)
Basophils Relative: 0 %
EOS PCT: 5 %
Eosinophils Absolute: 0.3 10*3/uL (ref 0.0–0.7)
HEMATOCRIT: 42 % (ref 36.0–46.0)
Hemoglobin: 12.5 g/dL (ref 12.0–15.0)
LYMPHS ABS: 2.2 10*3/uL (ref 0.7–4.0)
LYMPHS PCT: 34 %
MCH: 28.5 pg (ref 26.0–34.0)
MCHC: 29.8 g/dL — ABNORMAL LOW (ref 30.0–36.0)
MCV: 95.9 fL (ref 78.0–100.0)
MONO ABS: 0.7 10*3/uL (ref 0.1–1.0)
MONOS PCT: 12 %
NEUTROS ABS: 3.1 10*3/uL (ref 1.7–7.7)
Neutrophils Relative %: 49 %
Platelets: 245 10*3/uL (ref 150–400)
RBC: 4.38 MIL/uL (ref 3.87–5.11)
RDW: 16.2 % — AB (ref 11.5–15.5)
WBC: 6.3 10*3/uL (ref 4.0–10.5)

## 2015-12-31 LAB — BASIC METABOLIC PANEL
ANION GAP: 16 — AB (ref 5–15)
BUN: 31 mg/dL — AB (ref 6–20)
CHLORIDE: 88 mmol/L — AB (ref 101–111)
CO2: 36 mmol/L — AB (ref 22–32)
Calcium: 9.4 mg/dL (ref 8.9–10.3)
Creatinine, Ser: 1.1 mg/dL — ABNORMAL HIGH (ref 0.44–1.00)
GFR calc Af Amer: 53 mL/min — ABNORMAL LOW (ref >60)
GFR, EST NON AFRICAN AMERICAN: 46 mL/min — AB (ref >60)
GLUCOSE: 112 mg/dL — AB (ref 65–99)
POTASSIUM: 3.6 mmol/L (ref 3.5–5.1)
Sodium: 140 mmol/L (ref 135–145)

## 2015-12-31 MED ORDER — FUROSEMIDE 10 MG/ML IJ SOLN
80.0000 mg | Freq: Once | INTRAMUSCULAR | Status: AC
  Administered 2015-12-31: 80 mg via INTRAVENOUS
  Filled 2015-12-31: qty 8

## 2015-12-31 MED ORDER — METOLAZONE 2.5 MG PO TABS
2.5000 mg | ORAL_TABLET | Freq: Once | ORAL | Status: AC
  Administered 2015-12-31: 2.5 mg via ORAL
  Filled 2015-12-31: qty 1

## 2015-12-31 MED ORDER — POTASSIUM CHLORIDE CRYS ER 20 MEQ PO TBCR
20.0000 meq | EXTENDED_RELEASE_TABLET | Freq: Once | ORAL | Status: AC
  Administered 2015-12-31: 20 meq via ORAL
  Filled 2015-12-31: qty 1

## 2015-12-31 NOTE — Progress Notes (Signed)
Patient ID: Bianca [name], female   DOB: 25-Apr-1933, 82 y.o.   MRN: [id number]     Advanced Heart Failure Rounding Note  Referring Physician: Dr. Posey Pronto Primary Physician: Maurice Small, MD Primary Cardiologist: Dr Marlou Porch Primary HF: Dr. Haroldine Laws   Reason for Consultation: A/C diastolic CHF  Subjective:    Admitted 12/21/15 after "dyspnea attack" despite diuretics being increased in HF clinic earlier that day.  Given 40 mg IV lasix in ED, but then switched back to po diuretics.  Weight began to climb and placed back on IV lasix, but in setting BUN rising and hypotension this was again held and 500 cc of fluid given.   On po diuretic now.  Sleepy.  Daughter-in-law concerned she still has fluid overload.    Objective:   Weight Range: 227 lb 12.8 oz (103.329 kg) Body mass index is 36.79 kg/(m^2).   Vital Signs:   Temp:  [97.8 F (36.6 C)-98.7 F (37.1 C)] 97.8 F (36.6 C) (07/15 0640) Pulse Rate:  [71-80] 75 (07/15 0640) Resp:  [18] 18 (07/15 0640) BP: (91-101)/(43-63) 92/63 mmHg (07/15 1112) SpO2:  [88 %-98 %] 94 % (07/15 1112) Weight:  [227 lb 12.8 oz (103.329 kg)] 227 lb 12.8 oz (103.329 kg) (07/15 0253) Last BM Date: 12/29/15  Weight change: Filed Weights   12/29/15 0537 12/30/15 0342 12/31/15 0253  Weight: 226 lb 1.6 oz (102.558 kg) 216 lb 6.4 oz (98.158 kg) 227 lb 12.8 oz (103.329 kg)    Intake/Output:   Intake/Output Summary (Last 24 hours) at 12/31/15 1322 Last data filed at 12/31/15 1124  Gross per 24 hour  Intake    480 ml  Output   1900 ml  Net  -1420 ml     Physical Exam: General: Elderly appearing. NAD.  HEENT: normal,  Neck: supple. JVP 8-9 cm. Carotids 2+ bilat; no bruits. No thyromegaly or lymphadenopathy noted. Cor: PMI nondisplaced. RRR, no M/G/R Lungs: CTAB, normal effort Abdomen: obese, soft, non-distended. Organized R flank hematoma without ecchymosis. No HSM. No bruits or masses. +BS Extremities: no cyanosis, clubbing, rash, 1+ ankle  edema. Neuro: alert & orientedx3, cranial nerves grossly intact. moves all 4 extremities w/o difficulty. Affect pleasant  Telemetry: Reviewed personally,  A-sensed V-paced 90s  Labs: CBC  Recent Labs  12/30/15 0420 12/31/15 0334  WBC 6.5 6.3  NEUTROABS 3.7 3.1  HGB 13.1 12.5  HCT 43.8 42.0  MCV 96.1 95.9  PLT 257 99991111   Basic Metabolic Panel  Recent Labs  12/29/15 0525 12/30/15 0420 12/31/15 0334  NA 136 137 140  K 3.0* 3.4* 3.6  CL 88* 87* 88*  CO2 37* 41* 36*  GLUCOSE 107* 117* 112*  BUN 27* 25* 31*  CREATININE 0.84 1.02* 1.10*  CALCIUM 9.1 9.2 9.4  MG 2.0 2.0  --    Liver Function Tests No results for input(s): AST, ALT, ALKPHOS, BILITOT, PROT, ALBUMIN in the last 72 hours. No results for input(s): LIPASE, AMYLASE in the last 72 hours. Cardiac Enzymes No results for input(s): CKTOTAL, CKMB, CKMBINDEX, TROPONINI in the last 72 hours.  BNP: BNP (last 3 results)  Recent Labs  11/02/15 0736 11/24/15 0249 12/21/15 1839  BNP 143.9* 801.6* 948.0*    ProBNP (last 3 results) No results for input(s): PROBNP in the last 8760 hours.   D-Dimer No results for input(s): DDIMER in the last 72 hours. Hemoglobin A1C No results for input(s): HGBA1C in the last 72 hours. Fasting Lipid Panel No results for input(s): CHOL, HDL, LDLCALC,  TRIG, CHOLHDL, LDLDIRECT in the last 72 hours. Thyroid Function Tests No results for input(s): TSH, T4TOTAL, T3FREE, THYROIDAB in the last 72 hours.  Invalid input(s): FREET3  Other results:  Imaging/Studies:  No results found.  Latest Echo  Latest Cath   Medications:     Scheduled Medications: . acetaminophen  650 mg Oral Q8H  . apixaban  5 mg Oral BID  . bisoprolol  2.5 mg Oral QHS  . furosemide  80 mg Intravenous Once  . gabapentin  800 mg Oral TID  . guaiFENesin  600 mg Oral BID  . lidocaine  1 patch Transdermal Q24H  . metolazone  2.5 mg Oral Once  . nortriptyline  25 mg Oral QHS  . polyethylene glycol  17 g  Oral Daily  . potassium chloride  20 mEq Oral Once  . senna-docusate  1 tablet Oral BID  . sodium chloride  1 spray Each Nare Daily  . torsemide  60 mg Oral Daily    Infusions:    PRN Medications: albuterol, alum & mag hydroxide-simeth   Assessment/Plan   1. Acute on chronic diastolic heart failure (recovered LVEF after CRT) NICM EF 35% s/p BiV ICD - [employer]. -> EF 50-55% on echo 10/2015. I suspect she has some residual volume overload.  - Will give Lasix 80 mg IV x 1 with metolazone 2.5 x 1 today to try to mobilize the remainder of her fluid.  - Resume torsemide tomorrow. - Continue 2.5 mg bisoprolol.  Pt intolerant to coreg with profound fatigue.  - Creatinine remains stable.  2. Paroxysmal AF - CHA2DS2-VASc 6, off anticoagulation due to hematoma with very delayed resolution. - Started on eliquis 5 mg BID 12/26/15.  CBC stable. 3. Morbid obesity with h/o CO2 retention  - Clearly contributing to her dyspnea. She is markedly deconditioned.  To SNF on d/c. 4. R Flank Hematoma - Seen by surgery 12/25/15.  Organized flank hematoma. No role for surgery.  - Started on Eliquis 12/26/15. Remains stable thus far.  5. UTI - on antibiotics, contaminated culture. 6. AKI - Resolved. - Creatinine 0.8 -> 1.0 -> 1.1. 7. Epistaxis - No further with humidifier added to 02.  - Continue to follow closely as just started on Eliquis.  8. VT on ICD interrogation Did not tolerate coreg, but no problem so far with low dose bisoprolol.  Will continue bisoprolol 2.5 mg qhs.  - EP to see as outpatient to schedule generator change as [facility]  Disposition: Delay SNF discharge to tomorrow. Will schedule for HF follow up.   HF meds for SNF tomorrow Bisoprolol 2.5 mg QHS Torsemide 60 mg daily Potassium 40 meq BID Eliquis 5 mg BID  Should get BMET next week and have sent to HF clinic at [phone].  Length of Stay: [place]  12/31/2015, 1:22 PM  Advanced Heart  Failure Team Pager [phone] (M-F; 7a - 4p)  Please contact [facility] for night-coverage after hours (4p -7a ) and weekends on [email or web address]

## 2015-12-31 NOTE — Progress Notes (Signed)
Progress Note    Bianca [name]  [id number] DOB: April 10, 1933  DOA: 12/21/2015 PCP: No primary care provider on file.    Brief Narrative:   Bianca [name] is an 82 y.o. female with a PMH of PAF (anticoagulation discontinued after the patient developed a right flank hematoma requiring several transfusions after a fall), hypertension, chronic systolic and diastolic CHF (EF [id number]), history of pacemaker/ICD who was admitted 12/21/15 with dyspnea/attacks of dyspnea that come on suddenly. BNP was 950 on admission. Chest x-ray negative for obvious edema.  Assessment/Plan:   Principal Problem:   Acute on chronic diastolic congestive heart failure ([place]) Evaluated by cardiologist. Continue bisoprolol, torsemide. Overall, has diuresed well during this hospital stay. Still volume overloaded with plans to receive 80 mg of Lasix IV and 2.5 mg of metolazone per cardiology recommendations to mobilize remainder of fluid. Will need weekly BMETs at SNF with results faxed to HF clinic at [phone].  Active Problems:   HYPOKALEMIA  Secondary diuresis. Monitor and replace as needed. Goal K > 3.9 and Mg > 1.8 per EP.      Automatic implantable cardioverter-defibrillator in situ/ventricular tachycardia NICM EF 35% s/p BiV ICD - [employer]. -> EF 50-55% on echo 10/2015. EP to see as outpatient to schedule generator change as patient is at [facility] as of 12/22/15.    Morbid obesity (HCC)/obstructive sleep apnea Dyspnea likely in part due to obesity hypoventilation syndrome/OSA.    Neuropathy involving both lower extremities (HCC) Continue gabapentin.    Paroxysmal atrial fibrillation (HCC) CHA2DS2-VASc 6, off anticoagulation due to hematoma with very delayed resolution. Started on eliquis 5 mg BID 12/26/15.    Acute kidney injury/CKD (chronic kidney disease), stage III Resolved. Creatinine stable.    Subclinical hypothyroidism    UTI Completed 7 days of treatment with Rocephin. Urine cultures grew  multiple species.  Family Communication/Anticipated D/C date and plan/Code Status   DVT prophylaxis: Lovenox ordered. Code Status: Full Code.  Family Communication: Son [name] and daughter-in-law at bedside. Disposition Plan: [place] 01/01/16 if mental status stable, hypoxia resolved.   Medical Consultants:    Cardiology   Procedures:   None.  Anti-Infectives:   Rocephin 12/24/15---> 12/30/15  Subjective:   Bianca [name] is without significant complaint.  No chest pain or dyspnea.  Family reports some lethargy this morning, and some intermittent confusion.  Says her speech was slurred yesterday.  Hypoxic with saturation of 88% this a.m.  Objective:    Filed Vitals:   12/30/15 1500 12/30/15 2157 12/31/15 0253 12/31/15 0640  BP: 91/43 95/54  101/61  Pulse: 71 80  75  Temp: 98.7 F (37.1 C) 97.9 F (36.6 C)  97.8 F (36.6 C)  TempSrc: Oral Oral  Oral  Resp: 18 18  18   Height:      Weight:   103.329 kg (227 lb 12.8 oz)   SpO2: 97% 98%  90%    Intake/Output Summary (Last 24 hours) at 12/31/15 0956 Last data filed at 12/31/15 0859  Gross per 24 hour  Intake    480 ml  Output   1801 ml  Net  -1321 ml   Filed Weights   12/29/15 0537 12/30/15 0342 12/31/15 0253  Weight: 102.558 kg (226 lb 1.6 oz) 98.158 kg (216 lb 6.4 oz) 103.329 kg (227 lb 12.8 oz)    Exam: General exam: Appears calm and comfortable.  Respiratory system: Clear to auscultation. Respiratory effort normal. Cardiovascular system: S1 & S2 heard, RRR.  JVP 8  cm,  No rubs, gallops or clicks. No murmurs. Gastrointestinal system: Abdomen is nondistended, soft and nontender. No organomegaly or masses felt. Normal bowel sounds heard. Central nervous system: Alert and oriented. No focal neurological deficits. Extremities: 1+ edema with venous stasis changes. Skin: No rashes, lesions or ulcers Psychiatry: Judgement and insight appear diminished. Mood & affect appropriate.   Data Reviewed:   I have  personally reviewed following labs and imaging studies:  Labs: Basic Metabolic Panel:  Recent Labs Lab 12/25/15 0906 12/26/15 0905  12/27/15 1201 12/28/15 0320 12/29/15 0525 12/30/15 0420 12/31/15 0334  NA 134*  134* 134*  < > 137 138 136 137 140  K 3.9  3.9 3.9  < > 3.3* 3.4* 3.0* 3.4* 3.6  CL 96*  97* 94*  < > 93* 94* 88* 87* 88*  CO2 28  28 29   < > 35* 31 37* 41* 36*  GLUCOSE 169*  168* 170*  < > 173* 91 107* 117* 112*  BUN 33*  33* 31*  < > 26* 26* 27* 25* 31*  CREATININE 1.25*  1.25* 1.30*  < > 0.96 0.83 0.84 1.02* 1.10*  CALCIUM 8.9  9.0 9.1  < > 8.9 8.9 9.1 9.2 9.4  MG 2.1 2.6*  --   --  1.8 2.0 2.0  --   PHOS 4.0  --   --   --   --   --   --   --   < > = values in this interval not displayed. GFR Estimated Creatinine Clearance: 48.7 mL/min (by C-G formula based on Cr of 1.1). Liver Function Tests:  Recent Labs Lab 12/25/15 0906  ALBUMIN 3.5   CBC:  Recent Labs Lab 12/25/15 1212  12/27/15 0253 12/28/15 0320 12/29/15 0525 12/30/15 0420 12/31/15 0334  WBC 10.4  < > 8.8 7.7 6.1 6.5 6.3  NEUTROABS 6.3  --  5.3  --  3.8 3.7 3.1  HGB 12.4  < > 13.5 12.9 12.8 13.1 12.5  HCT 39.6  < > 42.3 41.2 41.3 43.8 42.0  MCV 95.7  < > 94.2 95.6 94.9 96.1 95.9  PLT 276  < > 224 234 244 257 245  < > = values in this interval not displayed.  Urine analysis:    Component Value Date/Time   COLORURINE YELLOW 12/24/2015 1225   APPEARANCEUR HAZY* 12/24/2015 1225   LABSPEC 1.022 12/24/2015 1225   PHURINE 5.0 12/24/2015 1225   GLUCOSEU NEGATIVE 12/24/2015 1225   HGBUR NEGATIVE 12/24/2015 1225   [place] 12/24/2015 1225   KETONESUR NEGATIVE 12/24/2015 1225   PROTEINUR NEGATIVE 12/24/2015 1225   UROBILINOGEN 0.2 11/21/2012 1750   NITRITE NEGATIVE 12/24/2015 1225   LEUKOCYTESUR MODERATE* 12/24/2015 1225   Microbiology Recent Results (from the past 240 hour(s))  Culture, Urine     Status: Abnormal   Collection Time: 12/24/15 12:25 PM  Result Value Ref  Range Status   Specimen Description URINE, CLEAN CATCH  Final   Special Requests NONE  Final   Culture MULTIPLE SPECIES PRESENT, SUGGEST RECOLLECTION (A)  Final   Report Status 12/25/2015 FINAL  Final  MRSA PCR Screening     Status: None   Collection Time: 12/27/15 12:12 AM  Result Value Ref Range Status   MRSA by PCR NEGATIVE NEGATIVE Final    Comment:        The GeneXpert MRSA Assay (FDA approved for NASAL specimens only), is one component of a comprehensive MRSA colonization surveillance program. It is not intended  to diagnose MRSA infection nor to guide or monitor treatment for MRSA infections.     Radiology: No results found.  Medications:   . acetaminophen  650 mg Oral Q8H  . apixaban  5 mg Oral BID  . bisoprolol  2.5 mg Oral QHS  . gabapentin  800 mg Oral TID  . guaiFENesin  600 mg Oral BID  . lidocaine  1 patch Transdermal Q24H  . nortriptyline  25 mg Oral QHS  . polyethylene glycol  17 g Oral Daily  . senna-docusate  1 tablet Oral BID  . sodium chloride  1 spray Each Nare Daily  . torsemide  60 mg Oral Daily   Continuous Infusions:   Time spent: 25 minutes.   LOS: 8 days   [facility] Pager [phone]. If unable to reach me by pager, please call my cell phone at [phone].  *Please refer to [email or web address], password [email or web address] to get updated schedule on who will round on this patient, as hospitalists switch teams weekly. If 7PM-7AM, please contact night-coverage at [email or web address], password [email or web address] for any overnight needs.  12/31/2015, 9:56 AM

## 2016-01-01 DIAGNOSIS — E569 Vitamin deficiency, unspecified: Secondary | ICD-10-CM | POA: Diagnosis not present

## 2016-01-01 DIAGNOSIS — R05 Cough: Secondary | ICD-10-CM | POA: Diagnosis not present

## 2016-01-01 DIAGNOSIS — G608 Other hereditary and idiopathic neuropathies: Secondary | ICD-10-CM | POA: Diagnosis not present

## 2016-01-01 DIAGNOSIS — E876 Hypokalemia: Secondary | ICD-10-CM

## 2016-01-01 DIAGNOSIS — R52 Pain, unspecified: Secondary | ICD-10-CM | POA: Diagnosis not present

## 2016-01-01 DIAGNOSIS — I10 Essential (primary) hypertension: Secondary | ICD-10-CM | POA: Diagnosis not present

## 2016-01-01 DIAGNOSIS — R0602 Shortness of breath: Secondary | ICD-10-CM | POA: Diagnosis not present

## 2016-01-01 DIAGNOSIS — I5033 Acute on chronic diastolic (congestive) heart failure: Secondary | ICD-10-CM | POA: Diagnosis not present

## 2016-01-01 DIAGNOSIS — I209 Angina pectoris, unspecified: Secondary | ICD-10-CM | POA: Diagnosis not present

## 2016-01-01 DIAGNOSIS — R41841 Cognitive communication deficit: Secondary | ICD-10-CM | POA: Diagnosis not present

## 2016-01-01 DIAGNOSIS — K5900 Constipation, unspecified: Secondary | ICD-10-CM | POA: Diagnosis not present

## 2016-01-01 DIAGNOSIS — I509 Heart failure, unspecified: Secondary | ICD-10-CM | POA: Diagnosis not present

## 2016-01-01 DIAGNOSIS — M069 Rheumatoid arthritis, unspecified: Secondary | ICD-10-CM | POA: Diagnosis not present

## 2016-01-01 DIAGNOSIS — R0902 Hypoxemia: Secondary | ICD-10-CM | POA: Diagnosis not present

## 2016-01-01 DIAGNOSIS — I4891 Unspecified atrial fibrillation: Secondary | ICD-10-CM | POA: Diagnosis not present

## 2016-01-01 DIAGNOSIS — E785 Hyperlipidemia, unspecified: Secondary | ICD-10-CM | POA: Diagnosis not present

## 2016-01-01 DIAGNOSIS — R0600 Dyspnea, unspecified: Secondary | ICD-10-CM | POA: Diagnosis not present

## 2016-01-01 DIAGNOSIS — R262 Difficulty in walking, not elsewhere classified: Secondary | ICD-10-CM | POA: Diagnosis not present

## 2016-01-01 DIAGNOSIS — R2689 Other abnormalities of gait and mobility: Secondary | ICD-10-CM | POA: Diagnosis not present

## 2016-01-01 DIAGNOSIS — N183 Chronic kidney disease, stage 3 (moderate): Secondary | ICD-10-CM | POA: Diagnosis not present

## 2016-01-01 DIAGNOSIS — E038 Other specified hypothyroidism: Secondary | ICD-10-CM

## 2016-01-01 DIAGNOSIS — I5023 Acute on chronic systolic (congestive) heart failure: Secondary | ICD-10-CM | POA: Diagnosis not present

## 2016-01-01 DIAGNOSIS — M6281 Muscle weakness (generalized): Secondary | ICD-10-CM | POA: Diagnosis not present

## 2016-01-01 DIAGNOSIS — R4182 Altered mental status, unspecified: Secondary | ICD-10-CM | POA: Diagnosis not present

## 2016-01-01 DIAGNOSIS — E669 Obesity, unspecified: Secondary | ICD-10-CM | POA: Diagnosis not present

## 2016-01-01 DIAGNOSIS — Z95810 Presence of automatic (implantable) cardiac defibrillator: Secondary | ICD-10-CM | POA: Diagnosis not present

## 2016-01-01 LAB — BASIC METABOLIC PANEL
ANION GAP: 12 (ref 5–15)
ANION GAP: 11 (ref 5–15)
BUN: 37 mg/dL — ABNORMAL HIGH (ref 6–20)
BUN: 38 mg/dL — ABNORMAL HIGH (ref 6–20)
CALCIUM: 9.6 mg/dL (ref 8.9–10.3)
CHLORIDE: 85 mmol/L — AB (ref 101–111)
CHLORIDE: 86 mmol/L — AB (ref 101–111)
CO2: 39 mmol/L — AB (ref 22–32)
CO2: 36 mmol/L — AB (ref 22–32)
Calcium: 9.3 mg/dL (ref 8.9–10.3)
Creatinine, Ser: 1.02 mg/dL — ABNORMAL HIGH (ref 0.44–1.00)
Creatinine, Ser: 1.02 mg/dL — ABNORMAL HIGH (ref 0.44–1.00)
GFR calc non Af Amer: 50 mL/min — ABNORMAL LOW (ref >60)
GFR calc non Af Amer: 50 mL/min — ABNORMAL LOW (ref >60)
GFR, EST AFRICAN AMERICAN: 58 mL/min — AB (ref >60)
GFR, EST AFRICAN AMERICAN: 58 mL/min — AB (ref >60)
GLUCOSE: 121 mg/dL — AB (ref 65–99)
Glucose, Bld: 107 mg/dL — ABNORMAL HIGH (ref 65–99)
Potassium: 2.7 mmol/L — CL (ref 3.5–5.1)
Potassium: 3.9 mmol/L (ref 3.5–5.1)
Sodium: 136 mmol/L (ref 135–145)
Sodium: 133 mmol/L — ABNORMAL LOW (ref 135–145)

## 2016-01-01 LAB — URINE MICROSCOPIC-ADD ON
Bacteria, UA: NONE SEEN
RBC / HPF: NONE SEEN RBC/hpf (ref 0–5)

## 2016-01-01 LAB — URINALYSIS, ROUTINE W REFLEX MICROSCOPIC
Bilirubin Urine: NEGATIVE
Glucose, UA: NEGATIVE mg/dL
Hgb urine dipstick: NEGATIVE
Ketones, ur: NEGATIVE mg/dL
NITRITE: NEGATIVE
PH: 6.5 (ref 5.0–8.0)
Protein, ur: NEGATIVE mg/dL
SPECIFIC GRAVITY, URINE: 1.01 (ref 1.005–1.030)

## 2016-01-01 MED ORDER — POTASSIUM CHLORIDE CRYS ER 20 MEQ PO TBCR
40.0000 meq | EXTENDED_RELEASE_TABLET | Freq: Two times a day (BID) | ORAL | Status: DC
  Administered 2016-01-01: 40 meq via ORAL
  Filled 2016-01-01: qty 2

## 2016-01-01 MED ORDER — TORSEMIDE 20 MG PO TABS
80.0000 mg | ORAL_TABLET | Freq: Every day | ORAL | Status: DC
Start: 2016-01-01 — End: 2016-01-19

## 2016-01-01 MED ORDER — POTASSIUM CHLORIDE CRYS ER 20 MEQ PO TBCR
40.0000 meq | EXTENDED_RELEASE_TABLET | Freq: Once | ORAL | Status: AC
  Administered 2016-01-01: 40 meq via ORAL
  Filled 2016-01-01: qty 2

## 2016-01-01 MED ORDER — FLUCONAZOLE 150 MG PO TABS
150.0000 mg | ORAL_TABLET | Freq: Once | ORAL | Status: AC
  Administered 2016-01-01: 150 mg via ORAL
  Filled 2016-01-01: qty 1

## 2016-01-01 MED ORDER — TORSEMIDE 20 MG PO TABS: 80.0000 mg | ORAL_TABLET | Freq: Every day | ORAL | Status: DC

## 2016-01-01 NOTE — Progress Notes (Signed)
Attempted to call report 5xs to [facility] no answer.

## 2016-01-01 NOTE — Clinical Social Work Placement (Signed)
   CLINICAL SOCIAL WORK PLACEMENT  NOTE  Date:  01/01/2016  Patient Details  Name: Bianca [name] MRN: [id number] Date of Birth: 1933-05-22  Clinical Social Work is seeking post-discharge placement for this patient at the [place] level of care (*CSW will initial, date and re-position this form in  chart as items are completed):  Yes   Patient/family provided with [facility] Work Department's list of facilities offering this level of care within the geographic area requested by the patient (or if unable, by the patient's family).  Yes   Patient/family informed of their freedom to choose among providers that offer the needed level of care, that participate in Medicare, Medicaid or managed care program needed by the patient, have an available bed and are willing to accept the patient.  Yes   Patient/family informed of [facility]'s ownership interest in [facility] and [facility], as well as of the fact that they are under no obligation to receive care at these facilities.  PASRR submitted to EDS on 12/26/15     PASRR number received on       Existing PASRR number confirmed on 12/26/15     FL2 transmitted to all facilities in geographic area requested by pt/family on 12/26/15     FL2 transmitted to all facilities within larger geographic area on       Patient informed that his/her managed care company has contracts with or will negotiate with certain facilities, including the following:        Yes   Patient/family informed of bed offers received.  Patient chooses bed at [facility]     Physician recommends and patient chooses bed at      Patient to be transferred to [facility] on 01/01/16.  Patient to be transferred to facility by Ambulance     Patient family notified on 01/01/16 of transfer.  Name of family member notified:  Patient daughter at bedside     PHYSICIAN       Additional Comment:   Barbette Or,  [place]

## 2016-01-01 NOTE — Clinical Social Work Note (Signed)
Clinical Social Worker facilitated patient discharge including contacting patient family and facility to confirm patient discharge plans.  Clinical information faxed to facility and family agreeable with plan.  CSW arranged ambulance transport via [facility] to [facility].  RN to call report prior to discharge.  Clinical Social Worker will sign off for now as social work intervention is no longer needed. Please consult us again if new need arises.  Jesse Dannika Hilgeman, LCSW [phone] 

## 2016-01-01 NOTE — Progress Notes (Signed)
Patient ID: Bianca [name], female   DOB: 1932-11-26, 82 y.o.   MRN: [id number]     Advanced Heart Failure Rounding Note  Referring Physician: Dr. Posey Pronto Primary Physician: Maurice Small, MD Primary Cardiologist: Dr Marlou Porch Primary HF: Dr. Haroldine Laws   Reason for Consultation: A/C diastolic CHF  Subjective:    Admitted 12/21/15 after "dyspnea attack" despite diuretics being increased in HF clinic earlier that day.  Given 40 mg IV lasix in ED, but then switched back to po diuretics.  Weight began to climb and placed back on IV lasix, but in setting BUN rising and hypotension this was again held and 500 cc of fluid given. Since then, has been diuresed extensively.    She got Lasix IV + metolazone yesterday given concern for some residual volume overload yesterday.  She feels better today.    Objective:   Weight Range: 226 lb 4.8 oz (102.649 kg) Body mass index is 36.54 kg/(m^2).   Vital Signs:   Temp:  [97.8 F (36.6 C)-98 F (36.7 C)] 98 F (36.7 C) (07/16 0545) Pulse Rate:  [71-79] 71 (07/16 0545) Resp:  [17-18] 18 (07/16 0545) BP: (99-100)/(57-64) 100/57 mmHg (07/16 0545) SpO2:  [94 %-95 %] 94 % (07/16 0545) Weight:  [226 lb 4.8 oz (102.649 kg)] 226 lb 4.8 oz (102.649 kg) (07/16 0416) Last BM Date: 12/31/15  Weight change: Filed Weights   12/30/15 0342 12/31/15 0253 01/01/16 0416  Weight: 216 lb 6.4 oz (98.158 kg) 227 lb 12.8 oz (103.329 kg) 226 lb 4.8 oz (102.649 kg)    Intake/Output:   Intake/Output Summary (Last 24 hours) at 01/01/16 1200 Last data filed at 01/01/16 [id number]  Gross per 24 hour  Intake    600 ml  Output   1751 ml  Net  -1151 ml     Physical Exam: General: Elderly appearing. NAD.  HEENT: normal,  Neck: supple. JVP 7-8 cm. Carotids 2+ bilat; no bruits. No thyromegaly or lymphadenopathy noted. Cor: PMI nondisplaced. RRR, no M/G/R Lungs: CTAB, normal effort Abdomen: obese, soft, non-distended. Organized R flank hematoma without ecchymosis. No HSM. No  bruits or masses. +BS Extremities: no cyanosis, clubbing, rash, no edema. Neuro: alert & orientedx3, cranial nerves grossly intact. moves all 4 extremities w/o difficulty. Affect pleasant  Telemetry: A-sensed V-paced 90s  Labs: CBC  Recent Labs  12/30/15 0420 12/31/15 0334  WBC 6.5 6.3  NEUTROABS 3.7 3.1  HGB 13.1 12.5  HCT 43.8 42.0  MCV 96.1 95.9  PLT 257 [id number]   Basic Metabolic Panel  Recent Labs  12/30/15 0420 12/31/15 0334 01/01/16 0258  NA 137 140 136  K 3.4* 3.6 2.7*  CL 87* 88* 85*  CO2 41* 36* 39*  GLUCOSE 117* 112* 107*  BUN 25* 31* 37*  CREATININE 1.02* 1.10* 1.02*  CALCIUM 9.2 9.4 9.3  MG 2.0  --   --    Liver Function Tests No results for input(s): AST, ALT, ALKPHOS, BILITOT, PROT, ALBUMIN in the last 72 hours. No results for input(s): LIPASE, AMYLASE in the last 72 hours. Cardiac Enzymes No results for input(s): CKTOTAL, CKMB, CKMBINDEX, TROPONINI in the last 72 hours.  BNP: BNP (last 3 results)  Recent Labs  11/02/15 0736 11/24/15 0249 12/21/15 1839  BNP 143.9* 801.6* 948.0*    ProBNP (last 3 results) No results for input(s): PROBNP in the last 8760 hours.   D-Dimer No results for input(s): DDIMER in the last 72 hours. Hemoglobin A1C No results for input(s): HGBA1C in the last 72  hours. Fasting Lipid Panel No results for input(s): CHOL, HDL, LDLCALC, TRIG, CHOLHDL, LDLDIRECT in the last 72 hours. Thyroid Function Tests No results for input(s): TSH, T4TOTAL, T3FREE, THYROIDAB in the last 72 hours.  Invalid input(s): FREET3  Other results:  Imaging/Studies:  No results found.  Latest Echo  Latest Cath   Medications:     Scheduled Medications: . acetaminophen  650 mg Oral Q8H  . apixaban  5 mg Oral BID  . bisoprolol  2.5 mg Oral QHS  . gabapentin  800 mg Oral TID  . guaiFENesin  600 mg Oral BID  . lidocaine  1 patch Transdermal Q24H  . nortriptyline  25 mg Oral QHS  . polyethylene glycol  17 g Oral Daily  .  potassium chloride  40 mEq Oral BID  . potassium chloride  40 mEq Oral Once  . senna-docusate  1 tablet Oral BID  . sodium chloride  1 spray Each Nare Daily  . torsemide  60 mg Oral Daily    Infusions:    PRN Medications: albuterol, alum & mag hydroxide-simeth   Assessment/Plan   1. Acute on chronic diastolic heart failure (recovered LVEF after CRT) NICM EF 35% s/p BiV ICD - [employer]. -> EF 50-55% on echo 10/2015. Volume status looks much improved and she feels better.  - She got torsemide 60 mg po today, will use 80 mg daily when she goes to SNF.  Will need KCl 40 bid. - Hypokalemic today with metolazone yesterday.  Got extra K today, repeat BMET at 1 pm to make sure appropriately repleted.   - Continue 2.5 mg bisoprolol.  Pt intolerant to coreg with profound fatigue.  - Creatinine remains stable.  2. Paroxysmal AF  CHA2DS2-VASc 6, off anticoagulation due to hematoma with very delayed resolution. - Started on eliquis 5 mg BID 12/26/15.  CBC stable. 3. Morbid obesity with h/o CO2 retention  Clearly contributing to her dyspnea. She is markedly deconditioned.  To SNF on d/c. 4. R Flank Hematoma Seen by surgery 12/25/15.  Organized flank hematoma. No role for surgery.  - Started on Eliquis 12/26/15. Remains stable thus far.  5. UTI Completed antibiotics.  6. AKI Resolved. - Creatinine 0.8 -> 1.0 -> 1.1 -> 1.1.  7. Epistaxis No further with humidifier added to 02.  - Continue to follow closely as just started on Eliquis.  8. VT on ICD interrogation Did not tolerate coreg, but no problem so far with low dose bisoprolol.  Will continue bisoprolol 2.5 mg qhs.  - EP to see as outpatient to schedule generator change as [facility] is [facility] as of 12[facility]  Disposition: May got to SNF today.  Will need to schedule for HF follow up in 10 days.   HF meds for SNF Bisoprolol 2.5 mg QHS Torsemide 80 mg daily Potassium 40 meq BID Eliquis 5 mg BID  Should get BMET next week and have sent to  HF clinic at [phone].  Length of Stay: [place]  01/01/2016, 12:00 PM  Advanced Heart Failure Team Pager [phone] (M-F; 7a - 4p)  Please contact [facility] for night-coverage after hours (4p -7a ) and weekends on [email or web address]

## 2016-01-01 NOTE — Discharge Summary (Addendum)
Physician Discharge Summary  Bianca [name] [id number] DOB: December 26, 1932 DOA: 12/21/2015  PCP: Jonathon Bellows, MD  Admit date: 12/21/2015 Discharge date: 01/01/2016   Recommendations for Outpatient Follow-Up:   1. The patient will F/U in the Heart Failure Clinic. A BMET should be drawn weekly to monitor electrolytes and renal function. 2. F/U repeat urine cultures sent 01/01/16 due to complaints of burning on urination.    Discharge Diagnosis:   Principal Problem:    Acute on chronic systolic and diastolic congestive heart failure (HCC) Active Problems:    HYPOKALEMIA    Chronic diastolic (congestive) heart failure (HCC)    Automatic implantable cardioverter-defibrillator in situ    Morbid obesity (HCC)    Neuropathy involving both lower extremities (HCC)    Paroxysmal atrial fibrillation (HCC)    OSA (obstructive sleep apnea)    CKD (chronic kidney disease), stage III    Subclinical hypothyroidism    Ventricular tachycardia ([place])    Dyspnea   Discharge disposition:  SNF: [facility].  Discharge Condition: Improved.  Diet recommendation: Low sodium, heart healthy.    History of Present Illness:   Bianca [name] is an 82 y.o. female with a PMH of PAF (anticoagulation discontinued after the patient developed a right flank hematoma requiring several transfusions after a fall), hypertension, chronic systolic and diastolic CHF (EF [id number]), history of pacemaker/ICD who was admitted 12/21/15 with dyspnea/attacks of dyspnea that come on suddenly. BNP was 950 on admission. Chest x-ray negative for obvious edema.   Hospital Course by Problem:   Principal Problem:  Acute on chronic systolic and diastolic congestive heart failure (HCC) Last 2 D echo done 10/26/15: EF 50-55% with grade I diastolic dysfunction, hypokinesis of lateral and inferolateral myocardium.  Evaluated by cardiologist. Continue bisoprolol, torsemide. Overall, has diuresed well during this hospital stay.  I/O- 1.3 L after receiving 80 mg of Lasix IV and 2.5 mg of metolazone per cardiology recommendations 12/31/15. Will need weekly BMETs at SNF with results faxed to HF clinic at [phone].  Active Problems:  HYPOKALEMIA  Secondary to diuresis. Will give 40 mEq 2 today. Goal K > 3.9 and Mg > 1.8 per EP.    Automatic implantable cardioverter-defibrillator in situ/ventricular tachycardia NICM EF 35% s/p BiV ICD - Medtronic. -> EF 50-55% on echo 10/2015. EP to see as outpatient to schedule generator change as patient is at [facility] as of 12/22/15.   Morbid obesity (HCC)/obstructive sleep apnea Dyspnea likely in part due to obesity hypoventilation syndrome/OSA.   Neuropathy involving both lower extremities (HCC) Continue gabapentin.   Paroxysmal atrial fibrillation (HCC) CHA2DS2-VASc 6, off anticoagulation due to hematoma with very delayed resolution. Started on eliquis 5 mg BID 12/26/15.   Acute kidney injury/CKD (chronic kidney disease), stage III Resolved. Creatinine stable.   Subclinical hypothyroidism   UTI Completed 7 days of treatment with Rocephin. Urine cultures grew multiple species. Repeat U/A showed some yeast.  Will give a dose of Diflucan.   Medical Consultants:    Cardiology   Discharge Exam:   Filed Vitals:   01/01/16 0545 01/01/16 1208  BP: 100/57 93/55  Pulse: 71 73  Temp: 98 F (36.7 C) 98 F (36.7 C)  Resp: 18 20   Filed Vitals:   12/31/15 2300 01/01/16 0416 01/01/16 0545 01/01/16 1208  BP:   100/57 93/55  Pulse: 72  71 73  Temp:   98 F (36.7 C) 98 F (36.7 C)  TempSrc:   Oral Oral  Resp: 18  18 20  Height:      Weight:  102.649 kg (226 lb 4.8 oz)    SpO2:   94% 98%    General exam: Appears calm and comfortable.  Respiratory system: Crackles bilaterally, worse on right. Respiratory effort normal. Cardiovascular system: S1 & S2 heard, RRR. JVP 8 cm, No rubs, gallops or clicks. No murmurs. Gastrointestinal system: Abdomen is  nondistended, soft and nontender. No organomegaly or masses felt. Normal bowel sounds heard. Central nervous system: Alert and oriented. No focal neurological deficits. Extremities: 1+ edema with venous stasis changes. Skin: No rashes, lesions or ulcers Psychiatry: Judgement and insight appear diminished. Mood & affect appropriate.   The results of significant diagnostics from this hospitalization (including imaging, microbiology, ancillary and laboratory) are listed below for reference.     Procedures and Diagnostic Studies:   Dg Chest 2 View  12/21/2015  CLINICAL DATA:  Shortness of breath off and on for 1 month. EXAM: CHEST  2 VIEW COMPARISON:  11/24/2015 FINDINGS: Low lung volumes on this AP and lateral exam. The cardio pericardial silhouette is enlarged. No focal airspace consolidation, pulmonary edema, or pleural effusion. Left-sided pacer/ AICD remains in place. The visualized bony structures of the thorax are intact. IMPRESSION: Low volume film without acute cardiopulmonary findings. Electronically Signed   By: Misty Stanley M.D.   On: 12/21/2015 19:26     Labs:   Basic Metabolic Panel:  Recent Labs Lab 12/26/15 0905  12/28/15 0320 12/29/15 0525 12/30/15 0420 12/31/15 0334 01/01/16 0258  NA 134*  < > 138 136 137 140 136  K 3.9  < > 3.4* 3.0* 3.4* 3.6 2.7*  CL 94*  < > 94* 88* 87* 88* 85*  CO2 29  < > 31 37* 41* 36* 39*  GLUCOSE 170*  < > 91 107* 117* 112* 107*  BUN 31*  < > 26* 27* 25* 31* 37*  CREATININE 1.30*  < > 0.83 0.84 1.02* 1.10* 1.02*  CALCIUM 9.1  < > 8.9 9.1 9.2 9.4 9.3  MG 2.6*  --  1.8 2.0 2.0  --   --   < > = values in this interval not displayed. GFR Estimated Creatinine Clearance: 52.3 mL/min (by C-G formula based on Cr of 1.02).  CBC:  Recent Labs Lab 12/27/15 0253 12/28/15 0320 12/29/15 0525 12/30/15 0420 12/31/15 0334  WBC 8.8 7.7 6.1 6.5 6.3  NEUTROABS 5.3  --  3.8 3.7 3.1  HGB 13.5 12.9 12.8 13.1 12.5  HCT 42.3 41.2 41.3 43.8 42.0  MCV  94.2 95.6 94.9 96.1 95.9  PLT 224 234 244 257 245   Urinalysis    Component Value Date/Time   COLORURINE YELLOW 01/01/2016 0741   APPEARANCEUR CLEAR 01/01/2016 0741   LABSPEC 1.010 01/01/2016 0741   PHURINE 6.5 01/01/2016 0741   GLUCOSEU NEGATIVE 01/01/2016 0741   HGBUR NEGATIVE 01/01/2016 0741   BILIRUBINUR NEGATIVE 01/01/2016 0741   KETONESUR NEGATIVE 01/01/2016 0741   PROTEINUR NEGATIVE 01/01/2016 0741   UROBILINOGEN 0.2 11/21/2012 1750   NITRITE NEGATIVE 01/01/2016 0741   LEUKOCYTESUR TRACE* 01/01/2016 0741     Microbiology Recent Results (from the past 240 hour(s))  Culture, Urine     Status: Abnormal   Collection Time: 12/24/15 12:25 PM  Result Value Ref Range Status   Specimen Description URINE, CLEAN CATCH  Final   Special Requests NONE  Final   Culture MULTIPLE SPECIES PRESENT, SUGGEST RECOLLECTION (A)  Final   Report Status 12/25/2015 FINAL  Final  MRSA PCR Screening  Status: None   Collection Time: 12/27/15 12:12 AM  Result Value Ref Range Status   MRSA by PCR NEGATIVE NEGATIVE Final    Comment:        The GeneXpert MRSA Assay (FDA approved for NASAL specimens only), is one component of a comprehensive MRSA colonization surveillance program. It is not intended to diagnose MRSA infection nor to guide or monitor treatment for MRSA infections.      Discharge Instructions:       Discharge Instructions    (HEART FAILURE PATIENTS) Call MD:  Anytime you have any of the following symptoms: 1) 3 pound weight gain in 24 hours or 5 pounds in 1 week 2) shortness of breath, with or without a dry hacking cough 3) swelling in the hands, feet or stomach 4) if you have to sleep on extra pillows at night in order to breathe.    Complete by:  As directed      Call MD for:  extreme fatigue    Complete by:  As directed      Diet - low sodium heart healthy    Complete by:  As directed      Increase activity slowly    Complete by:  As directed               Medication List    STOP taking these medications        furosemide 80 MG tablet  Commonly known as:  LASIX      TAKE these medications        acetaminophen 325 MG tablet  Commonly known as:  TYLENOL  Take 650 mg by mouth 2 (two) times daily.     allopurinol 300 MG tablet  Commonly known as:  ZYLOPRIM  Take 300 mg by mouth daily. Take with the 100mg  tablet to equal 400mg  daily     allopurinol 100 MG tablet  Commonly known as:  ZYLOPRIM  Take 100 mg by mouth daily. Takes along with a 300 mg tablet to equal 400 mg     apixaban 5 MG Tabs tablet  Commonly known as:  ELIQUIS  Take 1 tablet (5 mg total) by mouth 2 (two) times daily.     atorvastatin 10 MG tablet  Commonly known as:  LIPITOR  Take 10 mg by mouth daily at 6 PM.     bisoprolol 5 MG tablet  Commonly known as:  ZEBETA  Take 0.5 tablets (2.5 mg total) by mouth at bedtime.     CALCIUM 600+D 600-400 MG-UNIT tablet  Generic drug:  Calcium Carbonate-Vitamin D  Take 1 tablet by mouth daily.     colestipol 1 g tablet  Commonly known as:  COLESTID  Take 2 g by mouth at bedtime.     gabapentin 800 MG tablet  Commonly known as:  NEURONTIN  Take 800 mg by mouth 3 (three) times daily.     guaiFENesin 600 MG 12 hr tablet  Commonly known as:  MUCINEX  Take 1 tablet (600 mg total) by mouth 2 (two) times daily.     lidocaine 5 %  Commonly known as:  LIDODERM  Place 1 patch onto the skin daily. Remove & Discard patch within 12 hours or as directed by MD     nitroGLYCERIN 0.4 MG SL tablet  Commonly known as:  NITROSTAT  Place 1 tablet (0.4 mg total) under the tongue every 5 (five) minutes as needed for chest pain.     nortriptyline 25 MG capsule  Commonly known as:  PAMELOR  Take 25 mg by mouth at bedtime.     OXYGEN  Inhale 2 L/min into the lungs at bedtime as needed (uses as bedtime everynight and then occasionally iun the daytime as needed for SOB).     potassium chloride SA 20 MEQ tablet  Commonly known as:   K-DUR,KLOR-CON  Take 2 tablets (40 mEq total) by mouth 2 (two) times daily.     senna-docusate 8.6-50 MG tablet  Commonly known as:  Senokot-S  Take 1 tablet by mouth 2 (two) times daily.     torsemide 20 MG tablet  Commonly known as:  DEMADEX  Take 4 tablets (80 mg total) by mouth daily.     Vitamin D3 1000 units Caps  Take 1 capsule by mouth daily.       Follow-up Information    Follow up with [facility].   Specialty:  [place] information:   [address] [place] [phone]      Follow up with [facility] On 01/19/2016.   Specialty:  Cardiology   Why:  at 1100 am for post hospital follow up. Please bring all medications or facility list with you to your visit. The code for parking is 0003.   Contact information:   [address] [id number] [place] [phone]       Time coordinating discharge: 35 minutes.  Signed:  Raunel Dimartino  Pager [phone] [facility] 01/01/2016, 12:39 PM

## 2016-01-02 DIAGNOSIS — R5381 Other malaise: Secondary | ICD-10-CM | POA: Diagnosis not present

## 2016-01-02 DIAGNOSIS — S301XXA Contusion of abdominal wall, initial encounter: Secondary | ICD-10-CM | POA: Diagnosis not present

## 2016-01-02 DIAGNOSIS — I509 Heart failure, unspecified: Secondary | ICD-10-CM | POA: Diagnosis not present

## 2016-01-02 DIAGNOSIS — I480 Paroxysmal atrial fibrillation: Secondary | ICD-10-CM | POA: Diagnosis not present

## 2016-01-02 LAB — URINE CULTURE: SPECIAL REQUESTS: NORMAL

## 2016-01-03 ENCOUNTER — Encounter: Payer: Self-pay | Admitting: Internal Medicine

## 2016-01-09 DIAGNOSIS — G4733 Obstructive sleep apnea (adult) (pediatric): Secondary | ICD-10-CM | POA: Diagnosis not present

## 2016-01-09 DIAGNOSIS — I509 Heart failure, unspecified: Secondary | ICD-10-CM | POA: Diagnosis not present

## 2016-01-16 DIAGNOSIS — S301XXD Contusion of abdominal wall, subsequent encounter: Secondary | ICD-10-CM | POA: Diagnosis not present

## 2016-01-16 DIAGNOSIS — I5033 Acute on chronic diastolic (congestive) heart failure: Secondary | ICD-10-CM | POA: Diagnosis not present

## 2016-01-17 ENCOUNTER — Telehealth: Payer: Self-pay

## 2016-01-17 DIAGNOSIS — I5033 Acute on chronic diastolic (congestive) heart failure: Secondary | ICD-10-CM | POA: Diagnosis not present

## 2016-01-17 DIAGNOSIS — G608 Other hereditary and idiopathic neuropathies: Secondary | ICD-10-CM | POA: Diagnosis not present

## 2016-01-17 DIAGNOSIS — E569 Vitamin deficiency, unspecified: Secondary | ICD-10-CM | POA: Diagnosis not present

## 2016-01-17 DIAGNOSIS — E785 Hyperlipidemia, unspecified: Secondary | ICD-10-CM | POA: Diagnosis not present

## 2016-01-17 DIAGNOSIS — M6281 Muscle weakness (generalized): Secondary | ICD-10-CM | POA: Diagnosis not present

## 2016-01-17 DIAGNOSIS — R2689 Other abnormalities of gait and mobility: Secondary | ICD-10-CM | POA: Diagnosis not present

## 2016-01-17 DIAGNOSIS — R262 Difficulty in walking, not elsewhere classified: Secondary | ICD-10-CM | POA: Diagnosis not present

## 2016-01-17 DIAGNOSIS — R0602 Shortness of breath: Secondary | ICD-10-CM | POA: Diagnosis not present

## 2016-01-17 DIAGNOSIS — R0600 Dyspnea, unspecified: Secondary | ICD-10-CM | POA: Diagnosis not present

## 2016-01-17 DIAGNOSIS — E876 Hypokalemia: Secondary | ICD-10-CM | POA: Diagnosis not present

## 2016-01-17 DIAGNOSIS — I10 Essential (primary) hypertension: Secondary | ICD-10-CM | POA: Diagnosis not present

## 2016-01-17 DIAGNOSIS — R41841 Cognitive communication deficit: Secondary | ICD-10-CM | POA: Diagnosis not present

## 2016-01-17 DIAGNOSIS — R4182 Altered mental status, unspecified: Secondary | ICD-10-CM | POA: Diagnosis not present

## 2016-01-17 DIAGNOSIS — R05 Cough: Secondary | ICD-10-CM | POA: Diagnosis not present

## 2016-01-17 DIAGNOSIS — K5900 Constipation, unspecified: Secondary | ICD-10-CM | POA: Diagnosis not present

## 2016-01-17 DIAGNOSIS — M069 Rheumatoid arthritis, unspecified: Secondary | ICD-10-CM | POA: Diagnosis not present

## 2016-01-17 DIAGNOSIS — E669 Obesity, unspecified: Secondary | ICD-10-CM | POA: Diagnosis not present

## 2016-01-17 DIAGNOSIS — R52 Pain, unspecified: Secondary | ICD-10-CM | POA: Diagnosis not present

## 2016-01-17 DIAGNOSIS — I5041 Acute combined systolic (congestive) and diastolic (congestive) heart failure: Secondary | ICD-10-CM | POA: Diagnosis not present

## 2016-01-17 DIAGNOSIS — R0902 Hypoxemia: Secondary | ICD-10-CM | POA: Diagnosis not present

## 2016-01-17 DIAGNOSIS — I4891 Unspecified atrial fibrillation: Secondary | ICD-10-CM | POA: Diagnosis not present

## 2016-01-17 DIAGNOSIS — I209 Angina pectoris, unspecified: Secondary | ICD-10-CM | POA: Diagnosis not present

## 2016-01-17 NOTE — Telephone Encounter (Signed)
Referred to ICM clinic by Chanetta Marshall, NP to follow up post hospitalization.  Attempted ICM intro call to patient and no answer.

## 2016-01-19 ENCOUNTER — Telehealth: Payer: Self-pay | Admitting: Cardiology

## 2016-01-19 ENCOUNTER — Ambulatory Visit (HOSPITAL_COMMUNITY)
Admission: RE | Admit: 2016-01-19 | Discharge: 2016-01-19 | Disposition: A | Discharge status: Home / Self Care | Payer: PPO | Source: Ambulatory Visit | Attending: Cardiology | Admitting: Cardiology

## 2016-01-19 VITALS — BP 124/78 | HR 105 | Wt 225.4 lb

## 2016-01-19 DIAGNOSIS — I130 Hypertensive heart and chronic kidney disease with heart failure and stage 1 through stage 4 chronic kidney disease, or unspecified chronic kidney disease: Secondary | ICD-10-CM | POA: Insufficient documentation

## 2016-01-19 DIAGNOSIS — N183 Chronic kidney disease, stage 3 (moderate): Secondary | ICD-10-CM | POA: Diagnosis not present

## 2016-01-19 DIAGNOSIS — I428 Other cardiomyopathies: Secondary | ICD-10-CM | POA: Diagnosis not present

## 2016-01-19 DIAGNOSIS — Z95810 Presence of automatic (implantable) cardiac defibrillator: Secondary | ICD-10-CM | POA: Insufficient documentation

## 2016-01-19 DIAGNOSIS — Z823 Family history of stroke: Secondary | ICD-10-CM | POA: Insufficient documentation

## 2016-01-19 DIAGNOSIS — Z87891 Personal history of nicotine dependence: Secondary | ICD-10-CM | POA: Insufficient documentation

## 2016-01-19 DIAGNOSIS — I5022 Chronic systolic (congestive) heart failure: Secondary | ICD-10-CM | POA: Diagnosis not present

## 2016-01-19 DIAGNOSIS — Z79899 Other long term (current) drug therapy: Secondary | ICD-10-CM | POA: Insufficient documentation

## 2016-01-19 DIAGNOSIS — Z888 Allergy status to other drugs, medicaments and biological substances status: Secondary | ICD-10-CM | POA: Diagnosis not present

## 2016-01-19 DIAGNOSIS — I480 Paroxysmal atrial fibrillation: Secondary | ICD-10-CM | POA: Insufficient documentation

## 2016-01-19 DIAGNOSIS — Z833 Family history of diabetes mellitus: Secondary | ICD-10-CM | POA: Diagnosis not present

## 2016-01-19 DIAGNOSIS — I252 Old myocardial infarction: Secondary | ICD-10-CM | POA: Insufficient documentation

## 2016-01-19 DIAGNOSIS — G4730 Sleep apnea, unspecified: Secondary | ICD-10-CM | POA: Diagnosis not present

## 2016-01-19 DIAGNOSIS — I5032 Chronic diastolic (congestive) heart failure: Secondary | ICD-10-CM | POA: Diagnosis not present

## 2016-01-19 DIAGNOSIS — Z6836 Body mass index (BMI) 36.0-36.9, adult: Secondary | ICD-10-CM | POA: Diagnosis not present

## 2016-01-19 DIAGNOSIS — Z7901 Long term (current) use of anticoagulants: Secondary | ICD-10-CM | POA: Diagnosis not present

## 2016-01-19 DIAGNOSIS — Z8249 Family history of ischemic heart disease and other diseases of the circulatory system: Secondary | ICD-10-CM | POA: Diagnosis not present

## 2016-01-19 MED ORDER — TORSEMIDE 20 MG PO TABS: 80.0000 mg | ORAL_TABLET | Freq: Every day | ORAL | 6 refills | Status: DC

## 2016-01-19 NOTE — Telephone Encounter (Signed)
New message   1. Has your device fired? No   2. Is you device beeping? no  3. Are you experiencing draining or swelling at device site? no  4. Are you calling to see if we received your device transmission? no  5. Have you passed out? no    pt daughter call requesting to speak with RN about getting pt in for a sooner appt. Pt daughter states pt was seen in the hospital and while there had her defib check and still retaining fluid. Pt daughter wants to speak with RN to discuss if she would be able to be seen soon with [facility] and [facility] appts being on the same day. Please call back to discuss

## 2016-01-19 NOTE — Patient Instructions (Signed)
Continue taking Torsemide 80 mg (4 tabs) every morning. You will ALSO take 40 mg (2 tabs) in the afternoon on Tuesdays, Thursdays, and Saturdays.  Follow up 2 months.  Do the following things EVERYDAY: 1) Weigh yourself in the morning before breakfast. Write it down and keep it in a log. 2) Take your medicines as prescribed 3) Eat low salt foods-Limit salt (sodium) to 2000 mg per day.  4) Stay as active as you can everyday 5) Limit all fluids for the day to less than 2 liters

## 2016-01-19 NOTE — Telephone Encounter (Signed)
Per Dr Marlou Porch - OK for pt to f/u with Dr Lovena Le and CHF.  Daughter is agreeable.  Scheduled am tentative appt with Dr Marlou Porch for 11/17 at daughter's request.

## 2016-01-19 NOTE — Progress Notes (Signed)
Advanced Heart Failure Medication Review by a Pharmacist  Does the patient  feel that his/her medications are working for him/her?  yes  Has the patient been experiencing any side effects to the medications prescribed?  no  Does the patient measure his/her own blood pressure or blood glucose at home?  yes   Does the patient have any problems obtaining medications due to transportation or finances?   no  Understanding of regimen: good Understanding of indications: good Potential of compliance: good Patient understands to avoid NSAIDs. Patient understands to avoid decongestants.  Issues to address at subsequent visits: None   Pharmacist comments: Bianca [name] is a pleasant 82 yo F presenting from [facility] SN[facility] with current MAR. No significant discrepancies noted.   Bianca [name]. Bianca [name], PharmD, BCPS, CPP Clinical Pharmacist Pager: [phone] Phone: [phone] 01/19/2016 11:54 AM      Time with patient: 4 minutes Preparation and documentation time: 6 minutes Total time: 10 minutes

## 2016-01-19 NOTE — Progress Notes (Signed)
Patient ID: Bianca [name], female   DOB: 18-Oct-1932, 82 y.o.   MRN: [id number]    Advanced Heart Failure Clinic Note   Primary Physician: Maurice Small, MD Primary Cardiologist: Dr Marlou Porch Primary HF: Dr. Haroldine Laws   HPI: Bianca [name] is a 82 y.o. female with nonischemic cardiomyopathy, hx of atrial fibrillation-currently with chronic anticoagulation, hx of Chronic kidney disease-stage III, chronic systolic heart failure-status post Bi- ventricular ICD-followed by Dr. Lovena Le. No defibrillations. EF 35%. Symptoms have been very stable. NYHA class II except for hospitalization for heart failure she was discharged on 11/24/12 with primary diagnosis of acute on chronic systolic heart failure.  Admitted 10/24/15 after fall and developing a flank hematoma. HF team consulted to help manage fluid overload while getting multiple blood products to reverse her INR. Got 4u FFP and 2 RBCs, initially. HF team continued to follow for fluid management, including several doses of IV lasix. Lasix increased to 40 mg daily po prior to d/c to SNF.  Discharge weight 253 lbs. Echo in 5/17 with EF 50-55%  Admitted 6/8 -11/29/15 with acute onset SOB and CP. She required BMET and CXR significant for pulmonary edema.  She diuresed 13 lbs.  Discharge weight 235 lbs. Lasix increased to 40 mg po BID.   Admitted 7/5 through 01/01/16 with dyspnea and volume overload. Diuresed with IV lasix then transitioned to torsemide 60 mg daily. Discharge weight 226 pounds. Discharged to [facility]  Today she presents for post hospital follow up with her daughter in law. Last week at SNF torsemide was increased to 80 mg twice a day for about 5 days. Weight at SNF 223-225 pounds. All medications provided by SNF. Denies SOB/PND/Orthopnea. Ambulates with rolling walk. Followed by HHPT and [place].    Labs 01/01/2016: K 3.9 Creatinine 1.02  Labs 01/16/2016: Creatinine 0.97    Past Medical History:  Diagnosis Date  . A-fib ([place])   . Acute  blood loss anemia   . Arthritis   . Chronic systolic heart failure ([place])   . CKD (chronic kidney disease), stage III 12/22/2015  . Depression 11/24/2015  . Hypertension   . Hypokalemia   . Myocardial infarction ([place]) 2008  . Neuromuscular disorder ([place])   . Paroxysmal atrial fibrillation (HCC)   . Physical deconditioning   . Right flank hematoma   . Sleep apnea     Current Outpatient Prescriptions  Medication Sig Dispense Refill  . acetaminophen (TYLENOL) 325 MG tablet Take 650 mg by mouth 2 (two) times daily.    Marland Kitchen allopurinol (ZYLOPRIM) 100 MG tablet Take 100 mg by mouth daily. Takes along with a 300 mg tablet to equal 400 mg    . allopurinol (ZYLOPRIM) 300 MG tablet Take 300 mg by mouth daily. Take with the 100mg  tablet to equal 400mg  daily    . apixaban (ELIQUIS) 5 MG TABS tablet Take 1 tablet (5 mg total) by mouth 2 (two) times daily. 60 tablet 0  . atorvastatin (LIPITOR) 10 MG tablet Take 10 mg by mouth daily at 6 PM.     . bisoprolol (ZEBETA) 5 MG tablet Take 0.5 tablets (2.5 mg total) by mouth at bedtime. 30 tablet 0  . Calcium Carbonate-Vitamin D (CALCIUM 600+D) 600-400 MG-UNIT per tablet Take 1 tablet by mouth daily.     . Cholecalciferol (VITAMIN D3) 1000 UNITS CAPS Take 1 capsule by mouth daily.    . colestipol (COLESTID) 1 G tablet Take 2 g by mouth at bedtime.     Marland Kitchen  gabapentin (NEURONTIN) 800 MG tablet Take 800 mg by mouth 3 (three) times daily.    Marland Kitchen guaiFENesin (MUCINEX) 600 MG 12 hr tablet Take 1 tablet (600 mg total) by mouth 2 (two) times daily.    . nortriptyline (PAMELOR) 25 MG capsule Take 25 mg by mouth at bedtime.    . OXYGEN Inhale 2 L/min into the lungs at bedtime as needed (uses as bedtime everynight and then occasionally iun the daytime as needed for SOB).     . potassium chloride SA (K-DUR,KLOR-CON) 20 MEQ tablet Take 2 tablets (40 mEq total) by mouth 2 (two) times daily.    Marland Kitchen senna-docusate (SENOKOT-S) 8.6-50 MG tablet Take 1 tablet by mouth 2 (two) times daily.  30 tablet 0  . torsemide (DEMADEX) 20 MG tablet Take 4 tablets (80 mg total) by mouth daily. 30 tablet 0  . nitroGLYCERIN (NITROSTAT) 0.4 MG SL tablet Place 1 tablet (0.4 mg total) under the tongue every 5 (five) minutes as needed for chest pain. (Patient not taking: Reported on 01/19/2016) 25 tablet 6   No current facility-administered medications for this encounter.     Allergies  Allergen Reactions  . Quinapril Hcl Swelling    Tongue and throat  . Tape Other (See Comments)    Plastic tape causes irritation.   . Neosporin [Neomycin-Polymyxin-Gramicidin] Rash      Social History   Social History  . Marital status: Widowed    Spouse name: N/A  . Number of children: N/A  . Years of education: N/A   Occupational History  . Not on file.   Social History Main Topics  . Smoking status: Former Research scientist (life sciences)  . Smokeless tobacco: Former Systems developer  . Alcohol use No  . Drug use: No  . Sexual activity: Not Currently   Other Topics Concern  . Not on file   Social History Narrative  . No narrative on file      Family History  Problem Relation Age of Onset  . Tuberculosis Father   . Stroke Mother   . Congestive Heart Failure Mother   . Diabetes Brother   . Stroke Brother   . Cancer Brother   . Heart attack Son     Vitals:   01/19/16 1137  BP: 124/78  Pulse: (!) 105  SpO2: 95%  Weight: 225 lb 6.4 oz (102.2 kg)   Wt Readings from Last 3 Encounters:  01/19/16 225 lb 6.4 oz (102.2 kg)  01/01/16 226 lb 4.8 oz (102.6 kg)  12/21/15 238 lb (108 kg)      PHYSICAL EXAM: General: Elderly appearing. NAD. In [place]. Daughter present.   HEENT: normal Neck: supple. JVP ~10 , Carotids 2+ bilat; no bruits. No thyromegaly or nodule noted.  Cor: PMI nondisplaced. RRR, no M/G/R Lungs: Clear , normal effort On 2 liters [place].  Abdomen: obese soft, nondistended. Large Right flank hematoma. No ecchymosis. No HSM. No bruits or masses. Good bowel sounds. Extremities: no cyanosis, clubbing, rash,  R and LLE 1+ edema, Neuro: alert & orientedx3, cranial nerves grossly intact. moves all 4 extremities w/o difficulty. Affect pleasant  EKG: A sensed V paced 98 bpm   ASSESSMENT & PLAN: 1. NICM EF 35% s/p BiV ICD - Medtronic. -> EF recovered.50-55% on echo 10/2015 . Optivol- Fluid index well above baseline.  NYHA III. Mild volume overload noted.  Continue torsemide 80 mg in am and add 40 mg in pm Tues/Thur/Sat Continue bisoprolol 2.5 mg daily 2. Paroxysmal AF - CHA2DS2-VASc 6. Maintaining SR.  On apixaban . No bleeding problems.  3. Morbid obesity with h/o CO2 retention  4. R Flank Hematoma-   Follow up 2 months and at that time if she is doing ok will send back to Dr Marlou Porch.  Albertina Leise NP-C  1:36 PM

## 2016-01-20 DIAGNOSIS — I480 Paroxysmal atrial fibrillation: Secondary | ICD-10-CM | POA: Diagnosis not present

## 2016-01-20 DIAGNOSIS — G9009 Other idiopathic peripheral autonomic neuropathy: Secondary | ICD-10-CM | POA: Diagnosis not present

## 2016-01-20 DIAGNOSIS — I5032 Chronic diastolic (congestive) heart failure: Secondary | ICD-10-CM | POA: Diagnosis not present

## 2016-01-23 ENCOUNTER — Ambulatory Visit: Payer: PPO | Admitting: Cardiology

## 2016-01-23 ENCOUNTER — Telehealth: Payer: Self-pay | Admitting: Cardiology

## 2016-01-23 DIAGNOSIS — M069 Rheumatoid arthritis, unspecified: Secondary | ICD-10-CM | POA: Diagnosis not present

## 2016-01-23 DIAGNOSIS — Z95810 Presence of automatic (implantable) cardiac defibrillator: Secondary | ICD-10-CM | POA: Diagnosis not present

## 2016-01-23 DIAGNOSIS — Z6838 Body mass index (BMI) 38.0-38.9, adult: Secondary | ICD-10-CM | POA: Diagnosis not present

## 2016-01-23 DIAGNOSIS — Z9981 Dependence on supplemental oxygen: Secondary | ICD-10-CM | POA: Diagnosis not present

## 2016-01-23 DIAGNOSIS — F329 Major depressive disorder, single episode, unspecified: Secondary | ICD-10-CM | POA: Diagnosis not present

## 2016-01-23 DIAGNOSIS — I5033 Acute on chronic diastolic (congestive) heart failure: Secondary | ICD-10-CM | POA: Diagnosis not present

## 2016-01-23 DIAGNOSIS — Z7901 Long term (current) use of anticoagulants: Secondary | ICD-10-CM | POA: Diagnosis not present

## 2016-01-23 DIAGNOSIS — I130 Hypertensive heart and chronic kidney disease with heart failure and stage 1 through stage 4 chronic kidney disease, or unspecified chronic kidney disease: Secondary | ICD-10-CM | POA: Diagnosis not present

## 2016-01-23 DIAGNOSIS — G629 Polyneuropathy, unspecified: Secondary | ICD-10-CM | POA: Diagnosis not present

## 2016-01-23 DIAGNOSIS — I209 Angina pectoris, unspecified: Secondary | ICD-10-CM | POA: Diagnosis not present

## 2016-01-23 DIAGNOSIS — Z9181 History of falling: Secondary | ICD-10-CM | POA: Diagnosis not present

## 2016-01-23 DIAGNOSIS — G4733 Obstructive sleep apnea (adult) (pediatric): Secondary | ICD-10-CM | POA: Diagnosis not present

## 2016-01-23 DIAGNOSIS — N183 Chronic kidney disease, stage 3 (moderate): Secondary | ICD-10-CM | POA: Diagnosis not present

## 2016-01-23 NOTE — Telephone Encounter (Signed)
LMOVM for pt to return call 

## 2016-01-23 NOTE — Telephone Encounter (Signed)
spke w/ pt son and informed him of ERI. He said they was made aware of it when she was in the hospital. Pt son aware of appt on 02-08-2016 w/ MD.

## 2016-01-24 ENCOUNTER — Encounter: Payer: Self-pay | Admitting: Internal Medicine

## 2016-01-24 ENCOUNTER — Other Ambulatory Visit (HOSPITAL_COMMUNITY): Payer: Self-pay | Admitting: *Deleted

## 2016-01-24 MED ORDER — TORSEMIDE 20 MG PO TABS: 80.0000 mg | ORAL_TABLET | Freq: Every day | ORAL | 2 refills | Status: DC

## 2016-01-24 MED ORDER — APIXABAN 5 MG PO TABS: 5.0000 mg | ORAL_TABLET | Freq: Two times a day (BID) | ORAL | 2 refills | Status: DC

## 2016-01-24 MED ORDER — BISOPROLOL FUMARATE 5 MG PO TABS: 2.5000 mg | ORAL_TABLET | Freq: Every day | ORAL | 2 refills | Status: DC

## 2016-01-24 MED ORDER — ATORVASTATIN CALCIUM 10 MG PO TABS: 10.0000 mg | ORAL_TABLET | Freq: Every day | ORAL | 2 refills | Status: DC

## 2016-01-24 MED ORDER — POTASSIUM CHLORIDE CRYS ER 20 MEQ PO TBCR: 40.0000 meq | EXTENDED_RELEASE_TABLET | Freq: Two times a day (BID) | ORAL | 2 refills | Status: DC

## 2016-01-25 DIAGNOSIS — Z9981 Dependence on supplemental oxygen: Secondary | ICD-10-CM | POA: Diagnosis not present

## 2016-01-25 DIAGNOSIS — I5033 Acute on chronic diastolic (congestive) heart failure: Secondary | ICD-10-CM | POA: Diagnosis not present

## 2016-01-25 DIAGNOSIS — Z9181 History of falling: Secondary | ICD-10-CM | POA: Diagnosis not present

## 2016-01-25 DIAGNOSIS — N183 Chronic kidney disease, stage 3 (moderate): Secondary | ICD-10-CM | POA: Diagnosis not present

## 2016-01-25 DIAGNOSIS — Z6838 Body mass index (BMI) 38.0-38.9, adult: Secondary | ICD-10-CM | POA: Diagnosis not present

## 2016-01-25 DIAGNOSIS — F329 Major depressive disorder, single episode, unspecified: Secondary | ICD-10-CM | POA: Diagnosis not present

## 2016-01-25 DIAGNOSIS — M069 Rheumatoid arthritis, unspecified: Secondary | ICD-10-CM | POA: Diagnosis not present

## 2016-01-25 DIAGNOSIS — Z95810 Presence of automatic (implantable) cardiac defibrillator: Secondary | ICD-10-CM | POA: Diagnosis not present

## 2016-01-25 DIAGNOSIS — G629 Polyneuropathy, unspecified: Secondary | ICD-10-CM | POA: Diagnosis not present

## 2016-01-25 DIAGNOSIS — Z7901 Long term (current) use of anticoagulants: Secondary | ICD-10-CM | POA: Diagnosis not present

## 2016-01-25 DIAGNOSIS — I209 Angina pectoris, unspecified: Secondary | ICD-10-CM | POA: Diagnosis not present

## 2016-01-25 DIAGNOSIS — I130 Hypertensive heart and chronic kidney disease with heart failure and stage 1 through stage 4 chronic kidney disease, or unspecified chronic kidney disease: Secondary | ICD-10-CM | POA: Diagnosis not present

## 2016-01-25 DIAGNOSIS — G4733 Obstructive sleep apnea (adult) (pediatric): Secondary | ICD-10-CM | POA: Diagnosis not present

## 2016-01-26 NOTE — Telephone Encounter (Signed)
Attempted ICM call to patient for ICM intro.  Left message to return call.

## 2016-01-27 ENCOUNTER — Other Ambulatory Visit (HOSPITAL_COMMUNITY): Payer: Self-pay | Admitting: *Deleted

## 2016-01-27 MED ORDER — APIXABAN 5 MG PO TABS: 5.0000 mg | ORAL_TABLET | Freq: Two times a day (BID) | ORAL | 1 refills | Status: DC

## 2016-01-27 MED ORDER — TORSEMIDE 20 MG PO TABS: 80.0000 mg | ORAL_TABLET | Freq: Every day | ORAL | 1 refills | Status: DC

## 2016-01-30 DIAGNOSIS — Z95810 Presence of automatic (implantable) cardiac defibrillator: Secondary | ICD-10-CM | POA: Diagnosis not present

## 2016-01-30 DIAGNOSIS — F329 Major depressive disorder, single episode, unspecified: Secondary | ICD-10-CM | POA: Diagnosis not present

## 2016-01-30 DIAGNOSIS — G629 Polyneuropathy, unspecified: Secondary | ICD-10-CM | POA: Diagnosis not present

## 2016-01-30 DIAGNOSIS — N183 Chronic kidney disease, stage 3 (moderate): Secondary | ICD-10-CM | POA: Diagnosis not present

## 2016-01-30 DIAGNOSIS — Z9981 Dependence on supplemental oxygen: Secondary | ICD-10-CM | POA: Diagnosis not present

## 2016-01-30 DIAGNOSIS — Z6838 Body mass index (BMI) 38.0-38.9, adult: Secondary | ICD-10-CM | POA: Diagnosis not present

## 2016-01-30 DIAGNOSIS — G4733 Obstructive sleep apnea (adult) (pediatric): Secondary | ICD-10-CM | POA: Diagnosis not present

## 2016-01-30 DIAGNOSIS — I209 Angina pectoris, unspecified: Secondary | ICD-10-CM | POA: Diagnosis not present

## 2016-01-30 DIAGNOSIS — M069 Rheumatoid arthritis, unspecified: Secondary | ICD-10-CM | POA: Diagnosis not present

## 2016-01-30 DIAGNOSIS — I130 Hypertensive heart and chronic kidney disease with heart failure and stage 1 through stage 4 chronic kidney disease, or unspecified chronic kidney disease: Secondary | ICD-10-CM | POA: Diagnosis not present

## 2016-01-30 DIAGNOSIS — Z7901 Long term (current) use of anticoagulants: Secondary | ICD-10-CM | POA: Diagnosis not present

## 2016-01-30 DIAGNOSIS — I5033 Acute on chronic diastolic (congestive) heart failure: Secondary | ICD-10-CM | POA: Diagnosis not present

## 2016-01-30 DIAGNOSIS — Z9181 History of falling: Secondary | ICD-10-CM | POA: Diagnosis not present

## 2016-01-31 ENCOUNTER — Telehealth (HOSPITAL_COMMUNITY): Payer: Self-pay

## 2016-01-31 NOTE — Telephone Encounter (Signed)
Patient's daughter in law called CHF clinic triage to report patients weight up 6 lbs overnight and she is not feeling well. Complains of general fatigue, generalized edema in legs and belly, short of breath, and feels that "somethings not right". Denies SP, N/V, fever, chills. Patient has been taking increased dose of diuretics as prescribed by Darrick Grinder NP-C 2 weeks ago. Patient eager to be seen to make sure everything is ok. Added on to tomorrow's clinic with Amy Clegg NP-C. Advised if s/s become worse to call back or report to ED. Aware and agreeable to plan as staed above.  Renee Pain, RN

## 2016-02-01 ENCOUNTER — Ambulatory Visit (HOSPITAL_COMMUNITY)
Admission: RE | Admit: 2016-02-01 | Discharge: 2016-02-01 | Disposition: A | Discharge status: Home / Self Care | Payer: PPO | Source: Ambulatory Visit | Attending: Cardiology | Admitting: Cardiology

## 2016-02-01 ENCOUNTER — Telehealth (HOSPITAL_COMMUNITY): Payer: Self-pay

## 2016-02-01 VITALS — BP 112/66 | HR 86 | Resp 22 | Wt 226.0 lb

## 2016-02-01 DIAGNOSIS — I480 Paroxysmal atrial fibrillation: Secondary | ICD-10-CM | POA: Diagnosis not present

## 2016-02-01 DIAGNOSIS — I428 Other cardiomyopathies: Secondary | ICD-10-CM | POA: Diagnosis not present

## 2016-02-01 DIAGNOSIS — I5022 Chronic systolic (congestive) heart failure: Secondary | ICD-10-CM | POA: Diagnosis not present

## 2016-02-01 DIAGNOSIS — Z87891 Personal history of nicotine dependence: Secondary | ICD-10-CM | POA: Insufficient documentation

## 2016-02-01 DIAGNOSIS — G4730 Sleep apnea, unspecified: Secondary | ICD-10-CM | POA: Diagnosis not present

## 2016-02-01 DIAGNOSIS — I130 Hypertensive heart and chronic kidney disease with heart failure and stage 1 through stage 4 chronic kidney disease, or unspecified chronic kidney disease: Secondary | ICD-10-CM | POA: Diagnosis not present

## 2016-02-01 DIAGNOSIS — Z888 Allergy status to other drugs, medicaments and biological substances status: Secondary | ICD-10-CM | POA: Diagnosis not present

## 2016-02-01 DIAGNOSIS — Z79899 Other long term (current) drug therapy: Secondary | ICD-10-CM | POA: Diagnosis not present

## 2016-02-01 DIAGNOSIS — Z8249 Family history of ischemic heart disease and other diseases of the circulatory system: Secondary | ICD-10-CM | POA: Insufficient documentation

## 2016-02-01 DIAGNOSIS — I252 Old myocardial infarction: Secondary | ICD-10-CM | POA: Diagnosis not present

## 2016-02-01 DIAGNOSIS — Z6836 Body mass index (BMI) 36.0-36.9, adult: Secondary | ICD-10-CM | POA: Insufficient documentation

## 2016-02-01 DIAGNOSIS — Z823 Family history of stroke: Secondary | ICD-10-CM | POA: Diagnosis not present

## 2016-02-01 DIAGNOSIS — Z833 Family history of diabetes mellitus: Secondary | ICD-10-CM | POA: Diagnosis not present

## 2016-02-01 DIAGNOSIS — N183 Chronic kidney disease, stage 3 (moderate): Secondary | ICD-10-CM | POA: Diagnosis not present

## 2016-02-01 DIAGNOSIS — Z95810 Presence of automatic (implantable) cardiac defibrillator: Secondary | ICD-10-CM | POA: Diagnosis not present

## 2016-02-01 DIAGNOSIS — Z9981 Dependence on supplemental oxygen: Secondary | ICD-10-CM | POA: Diagnosis not present

## 2016-02-01 MED ORDER — SPIRONOLACTONE 25 MG PO TABS: 12.5000 mg | ORAL_TABLET | Freq: Every day | ORAL | 3 refills | Status: DC

## 2016-02-01 NOTE — Progress Notes (Signed)
Patient ID: Bianca [name], female   DOB: [birth year], 82 y.o.   MRN: [id number]    Advanced Heart Failure Clinic Note   Primary Physician: Maurice Small, MD Primary Cardiologist: Dr Marlou Porch Primary HF: Dr. Haroldine Laws   HPI: Bianca [name] is a 82 y.o. female with nonischemic cardiomyopathy, hx of atrial fibrillation-currently with chronic anticoagulation, hx of Chronic kidney disease-stage III, chronic systolic heart failure-status post Bi- ventricular ICD-followed by Dr. Lovena Le. No defibrillations. EF 35%. Symptoms have been very stable. NYHA class II except for hospitalization for heart failure she was discharged on 11/24/12 with primary diagnosis of acute on chronic systolic heart failure.  Admitted 10/24/15 after fall and developing a flank hematoma. HF team consulted to help manage fluid overload while getting multiple blood products to reverse her INR. Got 4u FFP and 2 RBCs, initially. HF team continued to follow for fluid management, including several doses of IV lasix. Lasix increased to 40 mg daily po prior to d/c to SNF.  Discharge weight 253 lbs. Echo in 5/17 with EF 50-55%  Admitted 6/8 -11/29/15 with acute onset SOB and CP. She required BMET and CXR significant for pulmonary edema.  She diuresed 13 lbs.  Discharge weight 235 lbs. Lasix increased to 40 mg po BID.   Admitted 7/5 through 01/01/16 with dyspnea and volume overload. Diuresed with IV lasix then transitioned to torsemide 60 mg daily. Discharge weight 226 pounds. Discharged to [facility]  Today she presents for HF acute work in for weight gain with her son. Weight at home trending up from 220- 226 pounds. Remains SOB with exertion. Uses a walker. Family prepares her medicaitons. Following low salt diet. Uses oxygen at night. She is living in [place] at [facility].   Labs 01/01/2016: K 3.9 Creatinine 1.02  Labs 01/16/2016: Creatinine 0.97    Past Medical History:  Diagnosis Date  . A-fib ([place])   . Acute  blood loss anemia   . Arthritis   . Chronic systolic heart failure ([place])   . CKD (chronic kidney disease), stage III 12/22/2015  . Depression 11/24/2015  . Hypertension   . Hypokalemia   . Myocardial infarction ([place]) 2008  . Neuromuscular disorder ([place])   . Paroxysmal atrial fibrillation ([facility])   . Physical deconditioning   . Right flank hematoma   . Sleep apnea     Current Outpatient Prescriptions  Medication Sig Dispense Refill  . acetaminophen (TYLENOL) 325 MG tablet Take 650 mg by mouth 2 (two) times daily as needed for mild pain or headache.     . allopurinol (ZYLOPRIM) 100 MG tablet Take 100 mg by mouth daily. Takes along with a 300 mg tablet to equal 400 mg    . allopurinol (ZYLOPRIM) 300 MG tablet Take 300 mg by mouth daily. Take with the 100mg  tablet to equal 400mg  daily    . apixaban (ELIQUIS) 5 MG TABS tablet Take 1 tablet (5 mg total) by mouth 2 (two) times daily. 14 tablet 1  . atorvastatin (LIPITOR) 10 MG tablet Take 1 tablet (10 mg total) by mouth daily at 6 PM. 90 tablet 2  . bisoprolol (ZEBETA) 5 MG tablet Take 0.5 tablets (2.5 mg total) by mouth at bedtime. 45 tablet 2  . Calcium Carbonate-Vitamin D (CALCIUM 600+D) 600-400 MG-UNIT per tablet Take 1 tablet by mouth daily.     . Cholecalciferol (VITAMIN D3) 1000 UNITS CAPS Take 1 capsule by mouth daily.    . colestipol (COLESTID) 1 G tablet Take 2  g by mouth at bedtime.     . gabapentin (NEURONTIN) 800 MG tablet Take 800 mg by mouth 3 (three) times daily.    Marland Kitchen guaiFENesin (MUCINEX) 600 MG 12 hr tablet Take 1 tablet (600 mg total) by mouth 2 (two) times daily.    . nortriptyline (PAMELOR) 25 MG capsule Take 25 mg by mouth at bedtime.    . OXYGEN Inhale 2 L/min into the lungs at bedtime as needed (uses as bedtime everynight and then occasionally iun the daytime as needed for SOB).     . potassium chloride SA (K-DUR,KLOR-CON) 20 MEQ tablet Take 2 tablets (40 mEq total) by mouth 2 (two) times daily. 360 tablet 2  .  senna-docusate (SENOKOT-S) 8.6-50 MG tablet Take 1 tablet by mouth 2 (two) times daily. 30 tablet 0  . torsemide (DEMADEX) 20 MG tablet Take 80 mg by mouth daily. Take additional 40 mg (2 tablets) in the afternoon on Tuesday, Thursday and Saturday    . nitroGLYCERIN (NITROSTAT) 0.4 MG SL tablet Place 1 tablet (0.4 mg total) under the tongue every 5 (five) minutes as needed for chest pain. (Patient not taking: Reported on 01/19/2016) 25 tablet 6   No current facility-administered medications for this encounter.     Allergies  Allergen Reactions  . Quinapril Hcl Swelling    Tongue and throat  . Tape Other (See Comments)    Plastic tape causes irritation.   . Neosporin [Neomycin-Polymyxin-Gramicidin] Rash      Social History   Social History  . Marital status: Widowed    Spouse name: N/A  . Number of children: N/A  . Years of education: N/A   Occupational History  . Not on file.   Social History Main Topics  . Smoking status: Former Research scientist (life sciences)  . Smokeless tobacco: Former Systems developer  . Alcohol use No  . Drug use: No  . Sexual activity: Not Currently   Other Topics Concern  . Not on file   Social History Narrative  . No narrative on file      Family History  Problem Relation Age of Onset  . Stroke Mother   . Congestive Heart Failure Mother   . Tuberculosis Father   . Diabetes Brother   . Stroke Brother   . Cancer Brother   . Heart attack Son     Vitals:   02/01/16 1004  BP: 112/66  Pulse: 86  Resp: (!) 22  SpO2: 90%  Weight: 226 lb (102.5 kg)   Wt Readings from Last 3 Encounters:  02/01/16 226 lb (102.5 kg)  01/19/16 225 lb 6.4 oz (102.2 kg)  01/01/16 226 lb 4.8 oz (102.6 kg)      PHYSICAL EXAM: General: Elderly appearing. NAD. Ambulated in the clinic with a walker. Son present .   HEENT: normal Neck: supple. JVP 6-7, Carotids 2+ bilat; no bruits. No thyromegaly or nodule noted.  Cor: PMI nondisplaced. RRR, no M/G/R Lungs: Clear , normal effort On 2 liters  [place].  Abdomen: obese soft, nondistended. Large Right flank hematoma. No ecchymosis. No HSM. No bruits or masses. Good bowel sounds. Extremities: no cyanosis, clubbing, rash, R and LLE trace edema Neuro: alert & orientedx3, cranial nerves grossly intact. moves all 4 extremities w/o difficulty. Affect pleasant   ASSESSMENT & PLAN: 1. NICM EF 35% s/p BiV ICD - [employer]. -> EF recovered.50-55% on echo 10/2015 .  NYHA III. Mild volume overload noted.  Continue torsemide 80 mg in am and add 40 mg in pm Tues/Thur/Sat  Continue bisoprolol 2.5 mg daily Consider losartan versus entresto next visit.  Add 12.5 mg spiro daily. BMET in 1 week. week. Asked [place] to add RN services.  2. Paroxysmal AF - CHA2DS2-VASc 6. Regular pulse. On apixaban . No bleeding problems.  3. Morbid obesity with h/o CO2 retention - using oxygen at night. I ambulated her in the clinic to see if she needed home oxygen. O2 sat remained > 88%. Does not qualify for home oxygen.  4. R Flank Hematoma-  Resolving.   Follow up in 2 months.      Demorris Choyce NP-C  10:21 AM

## 2016-02-01 NOTE — Progress Notes (Signed)
Advanced Heart Failure Medication Review by a Pharmacist  Does the patient  feel that his/her medications are working for him/her?  yes  Has the patient been experiencing any side effects to the medications prescribed?  no  Does the patient measure his/her own blood pressure or blood glucose at home?  yes   Does the patient have any problems obtaining medications due to transportation or finances?   no  Understanding of regimen: good Understanding of indications: good Potential of compliance: good Patient understands to avoid NSAIDs. Patient understands to avoid decongestants.  Issues to address at subsequent visits: None   Pharmacist comments:  Bianca [name] is a pleasant 82 yo F presenting with her son and without a medication list. She states that her daughter-in-law manages her medications for her based on her most recent medication list. She reports good compliance with her regimen and did not have any specific medication-related questions or concerns for me at this time.    Bianca [name]. Velva Harman, PharmD, BCPS, CPP Clinical Pharmacist Pager: [phone] Phone: [phone] 02/01/2016 10:14 AM     Time with patient: 10 minutes Preparation and documentation time: 2 minutes Total time: 12 minutes

## 2016-02-01 NOTE — Patient Instructions (Signed)
START Spironolactone 12.5 mg (1/2 tablet) once daily.  Will request home health nurse from [facility] to start weekly visits for assessments, medication management, and lab work.  Follow up 2 months. We will call you closer to this time, or you may call our office to schedule 1 month before you are due to be seen.  Do the following things EVERYDAY: 1) Weigh yourself in the morning before breakfast. Write it down and keep it in a log. 2) Take your medicines as prescribed 3) Eat low salt foods-Limit salt (sodium) to 2000 mg per day.  4) Stay as active as you can everyday 5) Limit all fluids for the day to less than 2 liters

## 2016-02-01 NOTE — Telephone Encounter (Signed)
Spoke with Zigmund Daniel at [place] at [facility] to set up patient with [place] per Darrick Grinder NP-C. Also asked to have bmet drawn x 1 week.  Renee Pain, RN

## 2016-02-03 ENCOUNTER — Other Ambulatory Visit (HOSPITAL_COMMUNITY): Payer: Self-pay | Admitting: Internal Medicine

## 2016-02-06 DIAGNOSIS — I209 Angina pectoris, unspecified: Secondary | ICD-10-CM | POA: Diagnosis not present

## 2016-02-06 DIAGNOSIS — Z7901 Long term (current) use of anticoagulants: Secondary | ICD-10-CM | POA: Diagnosis not present

## 2016-02-06 DIAGNOSIS — I130 Hypertensive heart and chronic kidney disease with heart failure and stage 1 through stage 4 chronic kidney disease, or unspecified chronic kidney disease: Secondary | ICD-10-CM | POA: Diagnosis not present

## 2016-02-06 DIAGNOSIS — F329 Major depressive disorder, single episode, unspecified: Secondary | ICD-10-CM | POA: Diagnosis not present

## 2016-02-06 DIAGNOSIS — Z9981 Dependence on supplemental oxygen: Secondary | ICD-10-CM | POA: Diagnosis not present

## 2016-02-06 DIAGNOSIS — G629 Polyneuropathy, unspecified: Secondary | ICD-10-CM | POA: Diagnosis not present

## 2016-02-06 DIAGNOSIS — G4733 Obstructive sleep apnea (adult) (pediatric): Secondary | ICD-10-CM | POA: Diagnosis not present

## 2016-02-06 DIAGNOSIS — N183 Chronic kidney disease, stage 3 (moderate): Secondary | ICD-10-CM | POA: Diagnosis not present

## 2016-02-06 DIAGNOSIS — I5033 Acute on chronic diastolic (congestive) heart failure: Secondary | ICD-10-CM | POA: Diagnosis not present

## 2016-02-06 DIAGNOSIS — Z9181 History of falling: Secondary | ICD-10-CM | POA: Diagnosis not present

## 2016-02-06 DIAGNOSIS — Z95810 Presence of automatic (implantable) cardiac defibrillator: Secondary | ICD-10-CM | POA: Diagnosis not present

## 2016-02-06 DIAGNOSIS — M069 Rheumatoid arthritis, unspecified: Secondary | ICD-10-CM | POA: Diagnosis not present

## 2016-02-06 DIAGNOSIS — Z6838 Body mass index (BMI) 38.0-38.9, adult: Secondary | ICD-10-CM | POA: Diagnosis not present

## 2016-02-07 DIAGNOSIS — F329 Major depressive disorder, single episode, unspecified: Secondary | ICD-10-CM | POA: Diagnosis not present

## 2016-02-07 DIAGNOSIS — Z9981 Dependence on supplemental oxygen: Secondary | ICD-10-CM | POA: Diagnosis not present

## 2016-02-07 DIAGNOSIS — I130 Hypertensive heart and chronic kidney disease with heart failure and stage 1 through stage 4 chronic kidney disease, or unspecified chronic kidney disease: Secondary | ICD-10-CM | POA: Diagnosis not present

## 2016-02-07 DIAGNOSIS — G4733 Obstructive sleep apnea (adult) (pediatric): Secondary | ICD-10-CM | POA: Diagnosis not present

## 2016-02-07 DIAGNOSIS — N183 Chronic kidney disease, stage 3 (moderate): Secondary | ICD-10-CM | POA: Diagnosis not present

## 2016-02-07 DIAGNOSIS — G629 Polyneuropathy, unspecified: Secondary | ICD-10-CM | POA: Diagnosis not present

## 2016-02-07 DIAGNOSIS — Z95810 Presence of automatic (implantable) cardiac defibrillator: Secondary | ICD-10-CM | POA: Diagnosis not present

## 2016-02-07 DIAGNOSIS — I209 Angina pectoris, unspecified: Secondary | ICD-10-CM | POA: Diagnosis not present

## 2016-02-07 DIAGNOSIS — Z7901 Long term (current) use of anticoagulants: Secondary | ICD-10-CM | POA: Diagnosis not present

## 2016-02-07 DIAGNOSIS — Z9181 History of falling: Secondary | ICD-10-CM | POA: Diagnosis not present

## 2016-02-07 DIAGNOSIS — M069 Rheumatoid arthritis, unspecified: Secondary | ICD-10-CM | POA: Diagnosis not present

## 2016-02-07 DIAGNOSIS — I5033 Acute on chronic diastolic (congestive) heart failure: Secondary | ICD-10-CM | POA: Diagnosis not present

## 2016-02-07 DIAGNOSIS — Z6838 Body mass index (BMI) 38.0-38.9, adult: Secondary | ICD-10-CM | POA: Diagnosis not present

## 2016-02-08 ENCOUNTER — Encounter: Payer: Self-pay | Admitting: Internal Medicine

## 2016-02-08 ENCOUNTER — Ambulatory Visit (INDEPENDENT_AMBULATORY_CARE_PROVIDER_SITE_OTHER): Payer: PPO | Admitting: Internal Medicine

## 2016-02-08 VITALS — BP 90/62 | HR 91 | Ht 66.0 in | Wt 227.6 lb

## 2016-02-08 DIAGNOSIS — I472 Ventricular tachycardia, unspecified: Secondary | ICD-10-CM

## 2016-02-08 DIAGNOSIS — I480 Paroxysmal atrial fibrillation: Secondary | ICD-10-CM | POA: Diagnosis not present

## 2016-02-08 DIAGNOSIS — Z95810 Presence of automatic (implantable) cardiac defibrillator: Secondary | ICD-10-CM

## 2016-02-08 LAB — CUP PACEART INCLINIC DEVICE CHECK
Brady Statistic AP VP Percent: 0.37 %
Brady Statistic AS VP Percent: 99.04 %
Brady Statistic AS VS Percent: 0.57 %
Date Time Interrogation Session: 20170823150749
HIGH POWER IMPEDANCE MEASURED VALUE: 57 Ohm
HighPow Impedance: 42 Ohm
HighPow Impedance: 456 Ohm
Implantable Lead Implant Date: 20080821
Implantable Lead Implant Date: 20080821
Implantable Lead Model: 5076
Lead Channel Impedance Value: 456 Ohm
Lead Channel Pacing Threshold Amplitude: 0.75 V
Lead Channel Pacing Threshold Pulse Width: 0.4 ms
Lead Channel Pacing Threshold Pulse Width: 1 ms
Lead Channel Sensing Intrinsic Amplitude: 1.5 mV
Lead Channel Setting Pacing Amplitude: 1.25 V
Lead Channel Setting Pacing Amplitude: 2 V
MDC IDC LEAD IMPLANT DT: 20080821
MDC IDC LEAD LOCATION: 753858
MDC IDC LEAD LOCATION: 753859
MDC IDC LEAD LOCATION: 753860
MDC IDC LEAD MODEL: 4194
MDC IDC MSMT BATTERY VOLTAGE: 2.61 V
MDC IDC MSMT LEADCHNL LV IMPEDANCE VALUE: 323 Ohm
MDC IDC MSMT LEADCHNL LV IMPEDANCE VALUE: 532 Ohm
MDC IDC MSMT LEADCHNL LV IMPEDANCE VALUE: 760 Ohm
MDC IDC MSMT LEADCHNL LV PACING THRESHOLD AMPLITUDE: 0.75 V
MDC IDC MSMT LEADCHNL RA PACING THRESHOLD AMPLITUDE: 0.5 V
MDC IDC MSMT LEADCHNL RA PACING THRESHOLD PULSEWIDTH: 0.4 ms
MDC IDC MSMT LEADCHNL RA SENSING INTR AMPL: 1.125 mV
MDC IDC MSMT LEADCHNL RV IMPEDANCE VALUE: 570 Ohm
MDC IDC MSMT LEADCHNL RV SENSING INTR AMPL: 11.875 mV
MDC IDC MSMT LEADCHNL RV SENSING INTR AMPL: 14.5 mV
MDC IDC SET LEADCHNL LV PACING PULSEWIDTH: 1 ms
MDC IDC SET LEADCHNL RV PACING AMPLITUDE: 2.5 V
MDC IDC SET LEADCHNL RV PACING PULSEWIDTH: 0.4 ms
MDC IDC SET LEADCHNL RV SENSING SENSITIVITY: 0.3 mV
MDC IDC STAT BRADY AP VS PERCENT: 0.01 %
MDC IDC STAT BRADY RA PERCENT PACED: 0.38 %
MDC IDC STAT BRADY RV PERCENT PACED: 99.42 %

## 2016-02-08 NOTE — Patient Instructions (Signed)
Medication Instructions:  Your physician recommends that you continue on your current medications as directed. Please refer to the Current Medication list given to you today.   Labwork: Your physician recommends that you return for lab work on 02/24/16 at 10:00am.  You do not have to fast   Testing/Procedures:  Bi V ICD generator change out on 02/29/16 Please check in at [facility] at 7:30am Do not eat or drink after midnight the night before procedure Will need someone to drive you home after procedure  Follow-Up: Your physician recommends that you schedule a follow-up appointment in: 10-14 days from 02/29/16 in device clinic and 3 months from 02/29/16 with Dr Lovena Le   Any Other Special Instructions Will Be Listed Below (If Applicable).     If you need a refill on your cardiac medications before your next appointment, please call your pharmacy.

## 2016-02-08 NOTE — Addendum Note (Signed)
Addended by: Janan Halter F on: 02/08/2016 11:51 AM   Modules accepted: Orders

## 2016-02-08 NOTE — Progress Notes (Signed)
HPI Bianca [name] returns today for followup. She is a 82 yo woman with a h/o an ICM, chronic systolic CHF, LBBB, s/p BiV ICD implant. She has been in the hospital. She has had problems with volume overload. She had an episode of VT in June with successful ATP. She was asymptomatic. Allergies  Allergen Reactions  . Quinapril Hcl Swelling    Tongue and throat  . Tape Other (See Comments)    Plastic tape causes irritation.   . Neosporin [Neomycin-Polymyxin-Gramicidin] Rash     Current Outpatient Prescriptions  Medication Sig Dispense Refill  . acetaminophen (TYLENOL) 325 MG tablet Take 650 mg by mouth 2 (two) times daily as needed for mild pain or headache.     . allopurinol (ZYLOPRIM) 100 MG tablet Take 100 mg by mouth daily. Takes along with a 300 mg tablet to equal 400 mg    . allopurinol (ZYLOPRIM) 300 MG tablet Take 300 mg by mouth daily. Take with the 100mg  tablet to equal 400mg  daily    . apixaban (ELIQUIS) 5 MG TABS tablet Take 1 tablet (5 mg total) by mouth 2 (two) times daily. 14 tablet 1  . atorvastatin (LIPITOR) 10 MG tablet Take 1 tablet (10 mg total) by mouth daily at 6 PM. 90 tablet 2  . bisoprolol (ZEBETA) 5 MG tablet Take 0.5 tablets (2.5 mg total) by mouth at bedtime. 45 tablet 2  . Calcium Carbonate-Vitamin D (CALCIUM 600+D) 600-400 MG-UNIT per tablet Take 1 tablet by mouth daily.     . Cholecalciferol (VITAMIN D3) 1000 UNITS CAPS Take 1 capsule by mouth daily.    . colestipol (COLESTID) 1 G tablet Take 2 g by mouth at bedtime.     . gabapentin (NEURONTIN) 800 MG tablet Take 800 mg by mouth 3 (three) times daily.    Marland Kitchen guaiFENesin (MUCINEX) 600 MG 12 hr tablet Take 1 tablet (600 mg total) by mouth 2 (two) times daily.    . nitroGLYCERIN (NITROSTAT) 0.4 MG SL tablet Place 1 tablet (0.4 mg total) under the tongue every 5 (five) minutes as needed for chest pain. 25 tablet 6  . nortriptyline (PAMELOR) 25 MG capsule Take 25 mg by mouth at bedtime.    . OXYGEN Inhale 2 L/min into  the lungs at bedtime as needed (uses as bedtime everynight and then occasionally iun the daytime as needed for SOB).     . potassium chloride SA (K-DUR,KLOR-CON) 20 MEQ tablet Take 2 tablets (40 mEq total) by mouth 2 (two) times daily. 360 tablet 2  . senna-docusate (SENOKOT-S) 8.6-50 MG tablet Take 1 tablet by mouth 2 (two) times daily. 30 tablet 0  . spironolactone (ALDACTONE) 25 MG tablet Take 0.5 tablets (12.5 mg total) by mouth daily. 45 tablet 3  . torsemide (DEMADEX) 20 MG tablet Take 80 mg by mouth daily. Take additional 40 mg (2 tablets) in the afternoon on Tuesday, Thursday and Saturday     No current facility-administered medications for this visit.      Past Medical History:  Diagnosis Date  . A-fib ([place])   . Acute blood loss anemia   . Arthritis   . Chronic systolic heart failure ([place])   . CKD (chronic kidney disease), stage III 12/22/2015  . Depression 11/24/2015  . Hypertension   . Hypokalemia   . Myocardial infarction ([place]) 2008  . Neuromuscular disorder ([place])   . Paroxysmal atrial fibrillation ([facility])   . Physical deconditioning   . Right flank hematoma   . Sleep apnea  ROS:   All systems reviewed and negative except as noted in the HPI.   Past Surgical History:  Procedure Laterality Date  . ABDOMINAL HYSTERECTOMY    . ANKLE SURGERY    . CARDIAC CATHETERIZATION    . CHOLECYSTECTOMY    . EYE SURGERY    . INSERT / REPLACE / REMOVE PACEMAKER  2008  . KNEE LIGAMENT RECONSTRUCTION  2012  . TONSILLECTOMY       Family History  Problem Relation Age of Onset  . Stroke Mother   . Congestive Heart Failure Mother   . Tuberculosis Father   . Diabetes Brother   . Stroke Brother   . Cancer Brother   . Heart attack Son      Social History   Social History  . Marital status: Widowed    Spouse name: N/A  . Number of children: N/A  . Years of education: N/A   Occupational History  . Not on file.   Social History Main Topics  . Smoking status: Former  Research scientist (life sciences)  . Smokeless tobacco: Former Systems developer  . Alcohol use No  . Drug use: No  . Sexual activity: Not Currently   Other Topics Concern  . Not on file   Social History Narrative  . No narrative on file     BP 90/62   Pulse 91   Ht 5\' 6"  (1.676 m)   Wt 227 lb 9.6 oz (103.2 kg)   BMI 36.74 kg/m   Physical Exam:  Well appearing 82 yo woman, NAD HEENT: Unremarkable Neck:  7 cm JVD, no thyromegally Lungs:  Clear with no wheezes. HEART:  Regular rate rhythm, no murmurs, no rubs, no clicks Abd:  soft, positive bowel sounds, no organomegally, no rebound, no guarding Ext:  2 plus pulses, no edema, no cyanosis, no clubbing Skin:  No rashes no nodules Neuro:  CN II through XII intact, motor grossly intact   DEVICE  Normal device function.  See PaceArt for details.   Assess/Plan: 1. Chronic systolic heart failure - Her symptoms are class 2. She has had some volume overload. She will continue her Torsemide. 2. VT - I have considered downgrading her to a biv ppm but she has had sustained MMVT with successful ATP. For this reason, will plan to place a BiV ICD 3. CAD - she denies anginal symptoms. Will follow. 4. Dyslipidemia - she will continue her statin therapy.  Mikle Bosworth.D.

## 2016-02-09 DIAGNOSIS — G4733 Obstructive sleep apnea (adult) (pediatric): Secondary | ICD-10-CM | POA: Diagnosis not present

## 2016-02-13 DIAGNOSIS — M069 Rheumatoid arthritis, unspecified: Secondary | ICD-10-CM | POA: Diagnosis not present

## 2016-02-13 DIAGNOSIS — N183 Chronic kidney disease, stage 3 (moderate): Secondary | ICD-10-CM | POA: Diagnosis not present

## 2016-02-13 DIAGNOSIS — Z95810 Presence of automatic (implantable) cardiac defibrillator: Secondary | ICD-10-CM | POA: Diagnosis not present

## 2016-02-13 DIAGNOSIS — Z6838 Body mass index (BMI) 38.0-38.9, adult: Secondary | ICD-10-CM | POA: Diagnosis not present

## 2016-02-13 DIAGNOSIS — G629 Polyneuropathy, unspecified: Secondary | ICD-10-CM | POA: Diagnosis not present

## 2016-02-13 DIAGNOSIS — I209 Angina pectoris, unspecified: Secondary | ICD-10-CM | POA: Diagnosis not present

## 2016-02-13 DIAGNOSIS — G4733 Obstructive sleep apnea (adult) (pediatric): Secondary | ICD-10-CM | POA: Diagnosis not present

## 2016-02-13 DIAGNOSIS — I5033 Acute on chronic diastolic (congestive) heart failure: Secondary | ICD-10-CM | POA: Diagnosis not present

## 2016-02-13 DIAGNOSIS — Z9181 History of falling: Secondary | ICD-10-CM | POA: Diagnosis not present

## 2016-02-13 DIAGNOSIS — I130 Hypertensive heart and chronic kidney disease with heart failure and stage 1 through stage 4 chronic kidney disease, or unspecified chronic kidney disease: Secondary | ICD-10-CM | POA: Diagnosis not present

## 2016-02-13 DIAGNOSIS — Z7901 Long term (current) use of anticoagulants: Secondary | ICD-10-CM | POA: Diagnosis not present

## 2016-02-13 DIAGNOSIS — F329 Major depressive disorder, single episode, unspecified: Secondary | ICD-10-CM | POA: Diagnosis not present

## 2016-02-13 DIAGNOSIS — Z9981 Dependence on supplemental oxygen: Secondary | ICD-10-CM | POA: Diagnosis not present

## 2016-02-14 ENCOUNTER — Ambulatory Visit: Payer: PPO | Admitting: Cardiology

## 2016-02-14 DIAGNOSIS — G4733 Obstructive sleep apnea (adult) (pediatric): Secondary | ICD-10-CM | POA: Diagnosis not present

## 2016-02-14 DIAGNOSIS — Z6838 Body mass index (BMI) 38.0-38.9, adult: Secondary | ICD-10-CM | POA: Diagnosis not present

## 2016-02-14 DIAGNOSIS — I130 Hypertensive heart and chronic kidney disease with heart failure and stage 1 through stage 4 chronic kidney disease, or unspecified chronic kidney disease: Secondary | ICD-10-CM | POA: Diagnosis not present

## 2016-02-14 DIAGNOSIS — F329 Major depressive disorder, single episode, unspecified: Secondary | ICD-10-CM | POA: Diagnosis not present

## 2016-02-14 DIAGNOSIS — Z7901 Long term (current) use of anticoagulants: Secondary | ICD-10-CM | POA: Diagnosis not present

## 2016-02-14 DIAGNOSIS — M069 Rheumatoid arthritis, unspecified: Secondary | ICD-10-CM | POA: Diagnosis not present

## 2016-02-14 DIAGNOSIS — G629 Polyneuropathy, unspecified: Secondary | ICD-10-CM | POA: Diagnosis not present

## 2016-02-14 DIAGNOSIS — Z95810 Presence of automatic (implantable) cardiac defibrillator: Secondary | ICD-10-CM | POA: Diagnosis not present

## 2016-02-14 DIAGNOSIS — I5033 Acute on chronic diastolic (congestive) heart failure: Secondary | ICD-10-CM | POA: Diagnosis not present

## 2016-02-14 DIAGNOSIS — Z9981 Dependence on supplemental oxygen: Secondary | ICD-10-CM | POA: Diagnosis not present

## 2016-02-14 DIAGNOSIS — Z9181 History of falling: Secondary | ICD-10-CM | POA: Diagnosis not present

## 2016-02-14 DIAGNOSIS — I209 Angina pectoris, unspecified: Secondary | ICD-10-CM | POA: Diagnosis not present

## 2016-02-14 DIAGNOSIS — N183 Chronic kidney disease, stage 3 (moderate): Secondary | ICD-10-CM | POA: Diagnosis not present

## 2016-02-21 DIAGNOSIS — M069 Rheumatoid arthritis, unspecified: Secondary | ICD-10-CM | POA: Diagnosis not present

## 2016-02-21 DIAGNOSIS — N183 Chronic kidney disease, stage 3 (moderate): Secondary | ICD-10-CM | POA: Diagnosis not present

## 2016-02-21 DIAGNOSIS — I5033 Acute on chronic diastolic (congestive) heart failure: Secondary | ICD-10-CM | POA: Diagnosis not present

## 2016-02-21 DIAGNOSIS — Z7901 Long term (current) use of anticoagulants: Secondary | ICD-10-CM | POA: Diagnosis not present

## 2016-02-21 DIAGNOSIS — I209 Angina pectoris, unspecified: Secondary | ICD-10-CM | POA: Diagnosis not present

## 2016-02-21 DIAGNOSIS — Z6838 Body mass index (BMI) 38.0-38.9, adult: Secondary | ICD-10-CM | POA: Diagnosis not present

## 2016-02-21 DIAGNOSIS — Z9181 History of falling: Secondary | ICD-10-CM | POA: Diagnosis not present

## 2016-02-21 DIAGNOSIS — Z9981 Dependence on supplemental oxygen: Secondary | ICD-10-CM | POA: Diagnosis not present

## 2016-02-21 DIAGNOSIS — F329 Major depressive disorder, single episode, unspecified: Secondary | ICD-10-CM | POA: Diagnosis not present

## 2016-02-21 DIAGNOSIS — G4733 Obstructive sleep apnea (adult) (pediatric): Secondary | ICD-10-CM | POA: Diagnosis not present

## 2016-02-21 DIAGNOSIS — Z95810 Presence of automatic (implantable) cardiac defibrillator: Secondary | ICD-10-CM | POA: Diagnosis not present

## 2016-02-21 DIAGNOSIS — I130 Hypertensive heart and chronic kidney disease with heart failure and stage 1 through stage 4 chronic kidney disease, or unspecified chronic kidney disease: Secondary | ICD-10-CM | POA: Diagnosis not present

## 2016-02-21 DIAGNOSIS — G629 Polyneuropathy, unspecified: Secondary | ICD-10-CM | POA: Diagnosis not present

## 2016-02-23 DIAGNOSIS — Z7901 Long term (current) use of anticoagulants: Secondary | ICD-10-CM | POA: Diagnosis not present

## 2016-02-23 DIAGNOSIS — Z95810 Presence of automatic (implantable) cardiac defibrillator: Secondary | ICD-10-CM | POA: Diagnosis not present

## 2016-02-23 DIAGNOSIS — I5033 Acute on chronic diastolic (congestive) heart failure: Secondary | ICD-10-CM | POA: Diagnosis not present

## 2016-02-23 DIAGNOSIS — F329 Major depressive disorder, single episode, unspecified: Secondary | ICD-10-CM | POA: Diagnosis not present

## 2016-02-23 DIAGNOSIS — N183 Chronic kidney disease, stage 3 (moderate): Secondary | ICD-10-CM | POA: Diagnosis not present

## 2016-02-23 DIAGNOSIS — G4733 Obstructive sleep apnea (adult) (pediatric): Secondary | ICD-10-CM | POA: Diagnosis not present

## 2016-02-23 DIAGNOSIS — M069 Rheumatoid arthritis, unspecified: Secondary | ICD-10-CM | POA: Diagnosis not present

## 2016-02-23 DIAGNOSIS — G629 Polyneuropathy, unspecified: Secondary | ICD-10-CM | POA: Diagnosis not present

## 2016-02-23 DIAGNOSIS — Z6838 Body mass index (BMI) 38.0-38.9, adult: Secondary | ICD-10-CM | POA: Diagnosis not present

## 2016-02-23 DIAGNOSIS — Z9981 Dependence on supplemental oxygen: Secondary | ICD-10-CM | POA: Diagnosis not present

## 2016-02-23 DIAGNOSIS — Z9181 History of falling: Secondary | ICD-10-CM | POA: Diagnosis not present

## 2016-02-23 DIAGNOSIS — I209 Angina pectoris, unspecified: Secondary | ICD-10-CM | POA: Diagnosis not present

## 2016-02-23 DIAGNOSIS — I130 Hypertensive heart and chronic kidney disease with heart failure and stage 1 through stage 4 chronic kidney disease, or unspecified chronic kidney disease: Secondary | ICD-10-CM | POA: Diagnosis not present

## 2016-02-24 ENCOUNTER — Other Ambulatory Visit: Payer: PPO | Admitting: *Deleted

## 2016-02-24 ENCOUNTER — Other Ambulatory Visit: Payer: PPO

## 2016-02-24 DIAGNOSIS — Z95810 Presence of automatic (implantable) cardiac defibrillator: Secondary | ICD-10-CM

## 2016-02-24 DIAGNOSIS — I472 Ventricular tachycardia, unspecified: Secondary | ICD-10-CM

## 2016-02-24 LAB — CBC WITH DIFFERENTIAL/PLATELET
BASOS PCT: 0 %
Basophils Absolute: 0 cells/uL (ref 0–200)
EOS PCT: 4 %
Eosinophils Absolute: 312 cells/uL (ref 15–500)
HEMATOCRIT: 39.6 % (ref 35.0–45.0)
HEMOGLOBIN: 12.8 g/dL (ref 11.7–15.5)
LYMPHS ABS: 2340 {cells}/uL (ref 850–3900)
Lymphocytes Relative: 30 %
MCH: 28.3 pg (ref 27.0–33.0)
MCHC: 32.3 g/dL (ref 32.0–36.0)
MCV: 87.6 fL (ref 80.0–100.0)
MONO ABS: 702 {cells}/uL (ref 200–950)
MPV: 10.2 fL (ref 7.5–12.5)
Monocytes Relative: 9 %
NEUTROS ABS: 4446 {cells}/uL (ref 1500–7800)
Neutrophils Relative %: 57 %
Platelets: 206 10*3/uL (ref 140–400)
RBC: 4.52 MIL/uL (ref 3.80–5.10)
RDW: 16.2 % — ABNORMAL HIGH (ref 11.0–15.0)
WBC: 7.8 10*3/uL (ref 3.8–10.8)

## 2016-02-24 LAB — BASIC METABOLIC PANEL
BUN: 21 mg/dL (ref 7–25)
CO2: 29 mmol/L (ref 20–31)
Calcium: 9.7 mg/dL (ref 8.6–10.4)
Chloride: 99 mmol/L (ref 98–110)
Creat: 1.01 mg/dL — ABNORMAL HIGH (ref 0.60–0.88)
GLUCOSE: 97 mg/dL (ref 65–99)
POTASSIUM: 4 mmol/L (ref 3.5–5.3)
SODIUM: 138 mmol/L (ref 135–146)

## 2016-02-27 DIAGNOSIS — N183 Chronic kidney disease, stage 3 (moderate): Secondary | ICD-10-CM | POA: Diagnosis not present

## 2016-02-27 DIAGNOSIS — Z9181 History of falling: Secondary | ICD-10-CM | POA: Diagnosis not present

## 2016-02-27 DIAGNOSIS — G629 Polyneuropathy, unspecified: Secondary | ICD-10-CM | POA: Diagnosis not present

## 2016-02-27 DIAGNOSIS — Z95810 Presence of automatic (implantable) cardiac defibrillator: Secondary | ICD-10-CM | POA: Diagnosis not present

## 2016-02-27 DIAGNOSIS — F329 Major depressive disorder, single episode, unspecified: Secondary | ICD-10-CM | POA: Diagnosis not present

## 2016-02-27 DIAGNOSIS — G4733 Obstructive sleep apnea (adult) (pediatric): Secondary | ICD-10-CM | POA: Diagnosis not present

## 2016-02-27 DIAGNOSIS — Z9981 Dependence on supplemental oxygen: Secondary | ICD-10-CM | POA: Diagnosis not present

## 2016-02-27 DIAGNOSIS — M069 Rheumatoid arthritis, unspecified: Secondary | ICD-10-CM | POA: Diagnosis not present

## 2016-02-27 DIAGNOSIS — I5033 Acute on chronic diastolic (congestive) heart failure: Secondary | ICD-10-CM | POA: Diagnosis not present

## 2016-02-27 DIAGNOSIS — Z7901 Long term (current) use of anticoagulants: Secondary | ICD-10-CM | POA: Diagnosis not present

## 2016-02-27 DIAGNOSIS — Z6838 Body mass index (BMI) 38.0-38.9, adult: Secondary | ICD-10-CM | POA: Diagnosis not present

## 2016-02-27 DIAGNOSIS — I209 Angina pectoris, unspecified: Secondary | ICD-10-CM | POA: Diagnosis not present

## 2016-02-27 DIAGNOSIS — I130 Hypertensive heart and chronic kidney disease with heart failure and stage 1 through stage 4 chronic kidney disease, or unspecified chronic kidney disease: Secondary | ICD-10-CM | POA: Diagnosis not present

## 2016-02-28 ENCOUNTER — Encounter (HOSPITAL_COMMUNITY): Payer: Self-pay | Admitting: Cardiology

## 2016-02-29 ENCOUNTER — Ambulatory Visit (HOSPITAL_COMMUNITY)
Admission: RE | Admit: 2016-02-29 | Discharge: 2016-02-29 | Disposition: A | Discharge status: Home / Self Care | Payer: PPO | Source: Ambulatory Visit | Attending: Internal Medicine | Admitting: Internal Medicine

## 2016-02-29 ENCOUNTER — Encounter (HOSPITAL_COMMUNITY): Payer: Self-pay | Admitting: Internal Medicine

## 2016-02-29 ENCOUNTER — Encounter (HOSPITAL_COMMUNITY)
Admission: RE | Disposition: A | Discharge status: Home / Self Care | Payer: Self-pay | Source: Ambulatory Visit | Attending: Internal Medicine

## 2016-02-29 ENCOUNTER — Encounter (HOSPITAL_COMMUNITY): Payer: PPO

## 2016-02-29 DIAGNOSIS — I5022 Chronic systolic (congestive) heart failure: Secondary | ICD-10-CM | POA: Insufficient documentation

## 2016-02-29 DIAGNOSIS — G4730 Sleep apnea, unspecified: Secondary | ICD-10-CM | POA: Diagnosis not present

## 2016-02-29 DIAGNOSIS — M1990 Unspecified osteoarthritis, unspecified site: Secondary | ICD-10-CM | POA: Diagnosis not present

## 2016-02-29 DIAGNOSIS — I130 Hypertensive heart and chronic kidney disease with heart failure and stage 1 through stage 4 chronic kidney disease, or unspecified chronic kidney disease: Secondary | ICD-10-CM | POA: Diagnosis not present

## 2016-02-29 DIAGNOSIS — I2510 Atherosclerotic heart disease of native coronary artery without angina pectoris: Secondary | ICD-10-CM | POA: Insufficient documentation

## 2016-02-29 DIAGNOSIS — F329 Major depressive disorder, single episode, unspecified: Secondary | ICD-10-CM | POA: Insufficient documentation

## 2016-02-29 DIAGNOSIS — Z4502 Encounter for adjustment and management of automatic implantable cardiac defibrillator: Secondary | ICD-10-CM | POA: Diagnosis not present

## 2016-02-29 DIAGNOSIS — Z823 Family history of stroke: Secondary | ICD-10-CM | POA: Diagnosis not present

## 2016-02-29 DIAGNOSIS — Z7901 Long term (current) use of anticoagulants: Secondary | ICD-10-CM | POA: Diagnosis not present

## 2016-02-29 DIAGNOSIS — E785 Hyperlipidemia, unspecified: Secondary | ICD-10-CM | POA: Diagnosis not present

## 2016-02-29 DIAGNOSIS — I447 Left bundle-branch block, unspecified: Secondary | ICD-10-CM | POA: Insufficient documentation

## 2016-02-29 DIAGNOSIS — Z8249 Family history of ischemic heart disease and other diseases of the circulatory system: Secondary | ICD-10-CM | POA: Diagnosis not present

## 2016-02-29 DIAGNOSIS — I472 Ventricular tachycardia, unspecified: Secondary | ICD-10-CM

## 2016-02-29 DIAGNOSIS — N183 Chronic kidney disease, stage 3 (moderate): Secondary | ICD-10-CM | POA: Insufficient documentation

## 2016-02-29 DIAGNOSIS — I252 Old myocardial infarction: Secondary | ICD-10-CM | POA: Diagnosis not present

## 2016-02-29 DIAGNOSIS — Z833 Family history of diabetes mellitus: Secondary | ICD-10-CM | POA: Diagnosis not present

## 2016-02-29 DIAGNOSIS — Z87891 Personal history of nicotine dependence: Secondary | ICD-10-CM | POA: Insufficient documentation

## 2016-02-29 DIAGNOSIS — I480 Paroxysmal atrial fibrillation: Secondary | ICD-10-CM | POA: Diagnosis not present

## 2016-02-29 DIAGNOSIS — Z95810 Presence of automatic (implantable) cardiac defibrillator: Secondary | ICD-10-CM

## 2016-02-29 DIAGNOSIS — I5023 Acute on chronic systolic (congestive) heart failure: Secondary | ICD-10-CM | POA: Diagnosis present

## 2016-02-29 HISTORY — PX: EP IMPLANTABLE DEVICE: SHX172B

## 2016-02-29 LAB — MRSA PCR SCREENING: MRSA by PCR: NEGATIVE

## 2016-02-29 SURGERY — ICD/BIV ICD GENERATOR CHANGEOUT

## 2016-02-29 MED ORDER — MIDAZOLAM HCL 5 MG/5ML IJ SOLN
INTRAMUSCULAR | Status: AC
  Filled 2016-02-29: qty 5

## 2016-02-29 MED ORDER — CHLORHEXIDINE GLUCONATE 4 % EX LIQD
60.0000 mL | Freq: Once | CUTANEOUS | Status: DC
  Filled 2016-02-29: qty 60

## 2016-02-29 MED ORDER — CEFAZOLIN SODIUM-DEXTROSE 2-4 GM/100ML-% IV SOLN
2.0000 g | INTRAVENOUS | Status: AC
  Administered 2016-02-29: 2 g via INTRAVENOUS
  Filled 2016-02-29: qty 100

## 2016-02-29 MED ORDER — MUPIROCIN 2 % EX OINT: 1.0000 "application " | TOPICAL_OINTMENT | Freq: Once | CUTANEOUS | Status: DC

## 2016-02-29 MED ORDER — LIDOCAINE HCL (PF) 1 % IJ SOLN
INTRAMUSCULAR | Status: DC | PRN
  Administered 2016-02-29: 33 mL

## 2016-02-29 MED ORDER — SODIUM CHLORIDE 0.9 % IR SOLN
Status: AC
  Filled 2016-02-29: qty 2

## 2016-02-29 MED ORDER — ONDANSETRON HCL 4 MG/2ML IJ SOLN: 4.0000 mg | Freq: Four times a day (QID) | INTRAMUSCULAR | Status: DC | PRN

## 2016-02-29 MED ORDER — SODIUM CHLORIDE 0.9 % IR SOLN
80.0000 mg | Status: AC
  Administered 2016-02-29: 80 mg
  Filled 2016-02-29: qty 2

## 2016-02-29 MED ORDER — FENTANYL CITRATE (PF) 100 MCG/2ML IJ SOLN
INTRAMUSCULAR | Status: AC
  Filled 2016-02-29: qty 2

## 2016-02-29 MED ORDER — MIDAZOLAM HCL 5 MG/5ML IJ SOLN
INTRAMUSCULAR | Status: DC | PRN
  Administered 2016-02-29 (×2): 1 mg via INTRAVENOUS

## 2016-02-29 MED ORDER — ACETAMINOPHEN 325 MG PO TABS: 325.0000 mg | ORAL_TABLET | ORAL | Status: DC | PRN

## 2016-02-29 MED ORDER — FENTANYL CITRATE (PF) 100 MCG/2ML IJ SOLN
INTRAMUSCULAR | Status: DC | PRN
  Administered 2016-02-29 (×2): 12.5 ug via INTRAVENOUS

## 2016-02-29 MED ORDER — MUPIROCIN 2 % EX OINT
TOPICAL_OINTMENT | CUTANEOUS | Status: AC
  Administered 2016-02-29: 1 via NASAL
  Filled 2016-02-29: qty 22

## 2016-02-29 MED ORDER — SODIUM CHLORIDE 0.9 % IV SOLN
INTRAVENOUS | Status: DC
  Administered 2016-02-29: 08:00:00 via INTRAVENOUS

## 2016-02-29 MED ORDER — LIDOCAINE HCL (PF) 1 % IJ SOLN
INTRAMUSCULAR | Status: AC
  Filled 2016-02-29: qty 60

## 2016-02-29 MED ORDER — CEFAZOLIN SODIUM-DEXTROSE 2-4 GM/100ML-% IV SOLN
INTRAVENOUS | Status: AC
  Filled 2016-02-29: qty 100

## 2016-02-29 SURGICAL SUPPLY — 4 items
CABLE SURGICAL S-101-97-12 (CABLE) ×2 IMPLANT
ICD VIVA XT CRT-D DTBA1D1 (ICD Generator) ×2 IMPLANT
PAD DEFIB LIFELINK (PAD) ×2 IMPLANT
TRAY PACEMAKER INSERTION (PACKS) ×2 IMPLANT

## 2016-02-29 NOTE — Interval H&P Note (Signed)
History and Physical Interval Note:  02/29/2016 8:40 AM  Bianca [name]  has presented today for surgery, with the diagnosis of eri  The various methods of treatment have been discussed with the patient and family. After consideration of risks, benefits and other options for treatment, the patient has consented to  Procedure(s): BIV ICD Generator Changeout (N/A) as a surgical intervention .  The patient's history has been reviewed, patient examined, no change in status, stable for surgery.  I have reviewed the patient's chart and labs.  Questions were answered to the patient's satisfaction.     Cristopher Peru

## 2016-02-29 NOTE — Interval H&P Note (Signed)
History and Physical Interval Note:  02/29/2016 10:21 AM  Bianca [name]  has presented today for surgery, with the diagnosis of eri  The various methods of treatment have been discussed with the patient and family. After consideration of risks, benefits and other options for treatment, the patient has consented to  Procedure(s): BIV ICD Generator Changeout (N/A) as a surgical intervention .  The patient's history has been reviewed, patient examined, no change in status, stable for surgery.  I have reviewed the patient's chart and labs.  Questions were answered to the patient's satisfaction.     Cristopher Peru

## 2016-02-29 NOTE — H&P (View-Only) (Signed)
HPI Mrs. Fells returns today for followup. She is a 82 yo woman with a h/o an ICM, chronic systolic CHF, LBBB, s/p BiV ICD implant. She has been in the hospital. She has had problems with volume overload. She had an episode of VT in June with successful ATP. She was asymptomatic. Allergies  Allergen Reactions  . Quinapril Hcl Swelling    Tongue and throat  . Tape Other (See Comments)    Plastic tape causes irritation.   . Neosporin [Neomycin-Polymyxin-Gramicidin] Rash     Current Outpatient Prescriptions  Medication Sig Dispense Refill  . acetaminophen (TYLENOL) 325 MG tablet Take 650 mg by mouth 2 (two) times daily as needed for mild pain or headache.     . allopurinol (ZYLOPRIM) 100 MG tablet Take 100 mg by mouth daily. Takes along with a 300 mg tablet to equal 400 mg    . allopurinol (ZYLOPRIM) 300 MG tablet Take 300 mg by mouth daily. Take with the 100mg  tablet to equal 400mg  daily    . apixaban (ELIQUIS) 5 MG TABS tablet Take 1 tablet (5 mg total) by mouth 2 (two) times daily. 14 tablet 1  . atorvastatin (LIPITOR) 10 MG tablet Take 1 tablet (10 mg total) by mouth daily at 6 PM. 90 tablet 2  . bisoprolol (ZEBETA) 5 MG tablet Take 0.5 tablets (2.5 mg total) by mouth at bedtime. 45 tablet 2  . Calcium Carbonate-Vitamin D (CALCIUM 600+D) 600-400 MG-UNIT per tablet Take 1 tablet by mouth daily.     . Cholecalciferol (VITAMIN D3) 1000 UNITS CAPS Take 1 capsule by mouth daily.    . colestipol (COLESTID) 1 G tablet Take 2 g by mouth at bedtime.     . gabapentin (NEURONTIN) 800 MG tablet Take 800 mg by mouth 3 (three) times daily.    Marland Kitchen guaiFENesin (MUCINEX) 600 MG 12 hr tablet Take 1 tablet (600 mg total) by mouth 2 (two) times daily.    . nitroGLYCERIN (NITROSTAT) 0.4 MG SL tablet Place 1 tablet (0.4 mg total) under the tongue every 5 (five) minutes as needed for chest pain. 25 tablet 6  . nortriptyline (PAMELOR) 25 MG capsule Take 25 mg by mouth at bedtime.    . OXYGEN Inhale 2 L/min into  the lungs at bedtime as needed (uses as bedtime everynight and then occasionally iun the daytime as needed for SOB).     . potassium chloride SA (K-DUR,KLOR-CON) 20 MEQ tablet Take 2 tablets (40 mEq total) by mouth 2 (two) times daily. 360 tablet 2  . senna-docusate (SENOKOT-S) 8.6-50 MG tablet Take 1 tablet by mouth 2 (two) times daily. 30 tablet 0  . spironolactone (ALDACTONE) 25 MG tablet Take 0.5 tablets (12.5 mg total) by mouth daily. 45 tablet 3  . torsemide (DEMADEX) 20 MG tablet Take 80 mg by mouth daily. Take additional 40 mg (2 tablets) in the afternoon on Tuesday, Thursday and Saturday     No current facility-administered medications for this visit.      Past Medical History:  Diagnosis Date  . A-fib ([place])   . Acute blood loss anemia   . Arthritis   . Chronic systolic heart failure ([place])   . CKD (chronic kidney disease), stage III 12/22/2015  . Depression 11/24/2015  . Hypertension   . Hypokalemia   . Myocardial infarction ([place]) 2008  . Neuromuscular disorder ([place])   . Paroxysmal atrial fibrillation ([facility])   . Physical deconditioning   . Right flank hematoma   . Sleep apnea  ROS:   All systems reviewed and negative except as noted in the HPI.   Past Surgical History:  Procedure Laterality Date  . ABDOMINAL HYSTERECTOMY    . ANKLE SURGERY    . CARDIAC CATHETERIZATION    . CHOLECYSTECTOMY    . EYE SURGERY    . INSERT / REPLACE / REMOVE PACEMAKER  2008  . KNEE LIGAMENT RECONSTRUCTION  2012  . TONSILLECTOMY       Family History  Problem Relation Age of Onset  . Stroke Mother   . Congestive Heart Failure Mother   . Tuberculosis Father   . Diabetes Brother   . Stroke Brother   . Cancer Brother   . Heart attack Son      Social History   Social History  . Marital status: Widowed    Spouse name: N/A  . Number of children: N/A  . Years of education: N/A   Occupational History  . Not on file.   Social History Main Topics  . Smoking status: Former  Research scientist (life sciences)  . Smokeless tobacco: Former Systems developer  . Alcohol use No  . Drug use: No  . Sexual activity: Not Currently   Other Topics Concern  . Not on file   Social History Narrative  . No narrative on file     BP 90/62   Pulse 91   Ht 5\' 6"  (1.676 m)   Wt 227 lb 9.6 oz (103.2 kg)   BMI 36.74 kg/m   Physical Exam:  Well appearing 82 yo woman, NAD HEENT: Unremarkable Neck:  7 cm JVD, no thyromegally Lungs:  Clear with no wheezes. HEART:  Regular rate rhythm, no murmurs, no rubs, no clicks Abd:  soft, positive bowel sounds, no organomegally, no rebound, no guarding Ext:  2 plus pulses, no edema, no cyanosis, no clubbing Skin:  No rashes no nodules Neuro:  CN II through XII intact, motor grossly intact   DEVICE  Normal device function.  See PaceArt for details.   Assess/Plan: 1. Chronic systolic heart failure - Her symptoms are class 2. She has had some volume overload. She will continue her Torsemide. 2. VT - I have considered downgrading her to a biv ppm but she has had sustained MMVT with successful ATP. For this reason, will plan to place a BiV ICD 3. CAD - she denies anginal symptoms. Will follow. 4. Dyslipidemia - she will continue her statin therapy.  Mikle Bosworth.D.

## 2016-02-29 NOTE — Discharge Instructions (Signed)

## 2016-03-05 DIAGNOSIS — F329 Major depressive disorder, single episode, unspecified: Secondary | ICD-10-CM | POA: Diagnosis not present

## 2016-03-05 DIAGNOSIS — I130 Hypertensive heart and chronic kidney disease with heart failure and stage 1 through stage 4 chronic kidney disease, or unspecified chronic kidney disease: Secondary | ICD-10-CM | POA: Diagnosis not present

## 2016-03-05 DIAGNOSIS — N183 Chronic kidney disease, stage 3 (moderate): Secondary | ICD-10-CM | POA: Diagnosis not present

## 2016-03-05 DIAGNOSIS — Z7901 Long term (current) use of anticoagulants: Secondary | ICD-10-CM | POA: Diagnosis not present

## 2016-03-05 DIAGNOSIS — Z95810 Presence of automatic (implantable) cardiac defibrillator: Secondary | ICD-10-CM | POA: Diagnosis not present

## 2016-03-05 DIAGNOSIS — Z6838 Body mass index (BMI) 38.0-38.9, adult: Secondary | ICD-10-CM | POA: Diagnosis not present

## 2016-03-05 DIAGNOSIS — I5033 Acute on chronic diastolic (congestive) heart failure: Secondary | ICD-10-CM | POA: Diagnosis not present

## 2016-03-05 DIAGNOSIS — G629 Polyneuropathy, unspecified: Secondary | ICD-10-CM | POA: Diagnosis not present

## 2016-03-05 DIAGNOSIS — M069 Rheumatoid arthritis, unspecified: Secondary | ICD-10-CM | POA: Diagnosis not present

## 2016-03-05 DIAGNOSIS — Z9981 Dependence on supplemental oxygen: Secondary | ICD-10-CM | POA: Diagnosis not present

## 2016-03-05 DIAGNOSIS — I209 Angina pectoris, unspecified: Secondary | ICD-10-CM | POA: Diagnosis not present

## 2016-03-05 DIAGNOSIS — G4733 Obstructive sleep apnea (adult) (pediatric): Secondary | ICD-10-CM | POA: Diagnosis not present

## 2016-03-08 DIAGNOSIS — G4733 Obstructive sleep apnea (adult) (pediatric): Secondary | ICD-10-CM | POA: Diagnosis not present

## 2016-03-08 NOTE — H&P (Signed)
ICD Criteria  Current LVEF:50%. Within 12 months prior to implant: Yes   Heart failure history: Yes, Class II  Cardiomyopathy history: Yes, Non-Ischemic Cardiomyopathy.  Atrial Fibrillation/Atrial Flutter: Yes, Paroxysmal.  Ventricular tachycardia history: Yes, Hemodynamic instability present. VT Type: Sustained Ventricular Tachycardia - Monomorphic.  Cardiac arrest history: No.  History of syndromes with risk of sudden death: No.  Previous ICD: Yes, Reason for ICD:  Primary prevention.  Current ICD indication: Secondary  PPM indication: Yes. Pacing type: Both. Greater than 40% RV pacing requirement anticipated. Indication: Complete Heart Block   Class I or II Bradycardia indication present: Yes  Beta Blocker therapy for 3 or more months: Yes, prescribed.   Ace Inhibitor/ARB therapy for 3 or more months: No, medical reason.

## 2016-03-11 DIAGNOSIS — G4733 Obstructive sleep apnea (adult) (pediatric): Secondary | ICD-10-CM | POA: Diagnosis not present

## 2016-03-22 ENCOUNTER — Telehealth: Payer: Self-pay

## 2016-03-22 ENCOUNTER — Ambulatory Visit (INDEPENDENT_AMBULATORY_CARE_PROVIDER_SITE_OTHER): Payer: PPO | Admitting: *Deleted

## 2016-03-22 DIAGNOSIS — I5033 Acute on chronic diastolic (congestive) heart failure: Secondary | ICD-10-CM | POA: Diagnosis not present

## 2016-03-22 DIAGNOSIS — I472 Ventricular tachycardia, unspecified: Secondary | ICD-10-CM

## 2016-03-22 LAB — CUP PACEART INCLINIC DEVICE CHECK
Battery Remaining Longevity: 90 mo
Battery Voltage: 3.04 V
Brady Statistic AS VP Percent: 97.16 %
Brady Statistic RA Percent Paced: 0.14 %
Date Time Interrogation Session: 20171005111957
HIGH POWER IMPEDANCE MEASURED VALUE: 58 Ohm
HighPow Impedance: 42 Ohm
Implantable Lead Implant Date: 20080821
Implantable Lead Implant Date: 20080821
Implantable Lead Location: 753859
Implantable Lead Model: 5076
Lead Channel Impedance Value: 399 Ohm
Lead Channel Impedance Value: 437 Ohm
Lead Channel Pacing Threshold Amplitude: 0.75 V
Lead Channel Pacing Threshold Pulse Width: 0.4 ms
Lead Channel Pacing Threshold Pulse Width: 1 ms
Lead Channel Sensing Intrinsic Amplitude: 1.375 mV
Lead Channel Sensing Intrinsic Amplitude: 14.75 mV
Lead Channel Sensing Intrinsic Amplitude: 15.75 mV
Lead Channel Setting Pacing Amplitude: 1.25 V
Lead Channel Setting Pacing Amplitude: 1.5 V
Lead Channel Setting Pacing Pulse Width: 1 ms
MDC IDC LEAD IMPLANT DT: 20080821
MDC IDC LEAD LOCATION: 753858
MDC IDC LEAD LOCATION: 753860
MDC IDC LEAD MODEL: 4194
MDC IDC MSMT LEADCHNL LV IMPEDANCE VALUE: 532 Ohm
MDC IDC MSMT LEADCHNL LV IMPEDANCE VALUE: 817 Ohm
MDC IDC MSMT LEADCHNL LV PACING THRESHOLD AMPLITUDE: 0.75 V
MDC IDC MSMT LEADCHNL RA PACING THRESHOLD AMPLITUDE: 0.5 V
MDC IDC MSMT LEADCHNL RA PACING THRESHOLD PULSEWIDTH: 0.4 ms
MDC IDC MSMT LEADCHNL RA SENSING INTR AMPL: 1.25 mV
MDC IDC MSMT LEADCHNL RV IMPEDANCE VALUE: 456 Ohm
MDC IDC MSMT LEADCHNL RV IMPEDANCE VALUE: 532 Ohm
MDC IDC SET LEADCHNL RV PACING AMPLITUDE: 2 V
MDC IDC SET LEADCHNL RV PACING PULSEWIDTH: 0.4 ms
MDC IDC SET LEADCHNL RV SENSING SENSITIVITY: 0.3 mV
MDC IDC STAT BRADY AP VP PERCENT: 0.13 %
MDC IDC STAT BRADY AP VS PERCENT: 0.01 %
MDC IDC STAT BRADY AS VS PERCENT: 2.7 %
MDC IDC STAT BRADY RV PERCENT PACED: 96.48 %

## 2016-03-22 NOTE — Progress Notes (Signed)
Wound check appointment. Steri-strips removed. Wound without redness or edema. Incision edges approximated, wound well healed. Normal device function. Thresholds, sensing, and impedances consistent with implant measurements. Device programmed at appropriate safety margins. Histogram distribution appropriate for patient and level of activity. No mode switches or ventricular arrhythmias noted. Patient educated about wound care and shock plan. ROV in 3 months with GT.

## 2016-03-22 NOTE — Telephone Encounter (Signed)
Referred to [facility] clinic by [facility], device tech/Dr Lovena Le.  Call to patient and ICM intro given.  She agreed to monthly ICM calls.  1st ICM remote transmission scheduled for 04/17/2016 which is day before office visit with Dr Marlou Porch on 04/18/2016.

## 2016-03-26 ENCOUNTER — Telehealth (HOSPITAL_COMMUNITY): Payer: Self-pay | Admitting: *Deleted

## 2016-03-26 NOTE — Telephone Encounter (Signed)
Bianca [name], with [employer] left VM to clarify dose of Torsemide, whether the 80 mg was every day or not.  Attempted to call back and left a VM on her secure line, pt is to take 80 mg every AM and 40 g in PM only on Tue, Thur and Sat, call back for further questions.

## 2016-04-10 DIAGNOSIS — G4733 Obstructive sleep apnea (adult) (pediatric): Secondary | ICD-10-CM | POA: Diagnosis not present

## 2016-04-13 DIAGNOSIS — Z23 Encounter for immunization: Secondary | ICD-10-CM | POA: Diagnosis not present

## 2016-04-13 DIAGNOSIS — I10 Essential (primary) hypertension: Secondary | ICD-10-CM | POA: Diagnosis not present

## 2016-04-17 ENCOUNTER — Telehealth: Payer: Self-pay

## 2016-04-17 ENCOUNTER — Ambulatory Visit (INDEPENDENT_AMBULATORY_CARE_PROVIDER_SITE_OTHER): Payer: PPO

## 2016-04-17 DIAGNOSIS — I5022 Chronic systolic (congestive) heart failure: Secondary | ICD-10-CM | POA: Diagnosis not present

## 2016-04-17 DIAGNOSIS — Z95810 Presence of automatic (implantable) cardiac defibrillator: Secondary | ICD-10-CM | POA: Diagnosis not present

## 2016-04-17 NOTE — Progress Notes (Signed)
EPIC Encounter for ICM Monitoring  Patient Name: Bianca [name] is a 82 y.o. female Date: 04/17/2016 Primary Care Physican: Jonathon Bellows, MD Primary Cardiologist: Marlou Porch Electrophysiologist: Lovena Le Dry Weight: unknown Bi-V Pacing:  96.2%       Attempted ICM call and unable to reach. Left detailed message regarding transmission.  Transmission reviewed.   Thoracic impedance normal but is starting to trend below baseline, 10/30, which would suggest the start of fluid accumulation.   Labs: 02/24/2016  Creatinine 1.01, BUN 21, Potassium 4.0, Sodium 138  01/01/2016 Creatinine 1.02, BUN 38, Potassium 3.9, Sodium 133  12/31/2015 Creatinine 1.10, BUN 31, Potassium 3.6, Sodium 140  12/30/2015 Creatinine 1.02, BUN 25, Potassium 3.4, Sodium 137  12/29/2015 Creatinine 0.84, BUN 27, Potassium 3.0, Sodium 136  12/28/2015 Creatinine 0.83, BUN 26, Potassium 3.4, Sodium 138  12/27/2015 Creatinine 0.96, BUN 2.6 Potassium 3.3, Sodium 137  12/27/2015 Creatinine 0.86, BUN 29, Potassium 4.9, Sodium 134  12/26/2015 Creatinine 1.30, BUN 31, Potassium 3.9, Sodium 134  12/25/2015 Creatinine 1.25, BUN 33, Potassium 3.9, Sodium 134  12/24/2015 Creatinine 1.22, BUN 25, Potassium 3.9, Sodium 136  12/23/2015 Creatinine 1.11, BUN 19, Potassium 4.0, Sodium 138  12/22/2015 Creatinine 1.02, BUN 15, Potassium 3.1, Sodium 139  12/21/2015 Creatinine 1.17, BUN 18, Potassium 3.0, Sodium 138    Follow-up plan: ICM clinic phone appointment on 05/18/2016.  Office appointment with Dr Marlou Porch on 04/18/2016 and Dr Lovena Le on 06/01/2016.    Copy of ICM check sent to primary cardiologist and device physician for Dr Marlou Porch to review prior to office appointment on 11/1.   ICM trend: 04/17/2016       Rosalene Billings, RN 04/17/2016 10:28 AM

## 2016-04-17 NOTE — Telephone Encounter (Signed)
Remote ICM transmission received.  Attempted patient call and left detailed message regarding transmission.  Advised to return call for any fluid symptoms or questions.   

## 2016-04-18 ENCOUNTER — Ambulatory Visit (INDEPENDENT_AMBULATORY_CARE_PROVIDER_SITE_OTHER): Payer: PPO | Admitting: Cardiology

## 2016-04-18 ENCOUNTER — Encounter: Payer: Self-pay | Admitting: Cardiology

## 2016-04-18 VITALS — BP 110/68 | HR 90 | Ht 66.0 in | Wt 228.4 lb

## 2016-04-18 DIAGNOSIS — Z7901 Long term (current) use of anticoagulants: Secondary | ICD-10-CM

## 2016-04-18 DIAGNOSIS — I5022 Chronic systolic (congestive) heart failure: Secondary | ICD-10-CM

## 2016-04-18 DIAGNOSIS — I480 Paroxysmal atrial fibrillation: Secondary | ICD-10-CM | POA: Diagnosis not present

## 2016-04-18 NOTE — Progress Notes (Signed)
[facility]. [address], [address] [place], [place]  [address] Phone: [phone] Fax:  [phone]  Date:  04/18/2016   ID:  SOLACE ELLIFRITZ, DOB 1932-11-02, MRN [id number]  PCP:  Jonathon Bellows, MD   History of Present Illness: Bianca [name] is a 82 y.o. female with nonischemic cardiomyopathy, hx of atrial fibrillation-currently with chronic anticoagulation, hx of Chronic kidney disease-stage III, chronic systolic heart failure-status post Bi- ventricular ICD-followed by Dr. Lovena Le. No defibrillations. EF 35%. Symptoms have been very stable. NYHA class II except for hospitalization for heart failure she was discharged on 11/24/12 with primary diagnosis of acute on chronic systolic heart failure. She was admitted with fluid overload, shortness of breath and diuresed successfully.  She did have periods of hypercarbia with CO2 above 50 with hypersomnolence and has had untreated sleep apnea. Still with mild SOB when talking but not severe. Doing well with CPAP.   Husband had dementia, died. Her family is very helpful with her overall care.  Unfortunately her son has lung cancer that has metastasized. He is 80 years old.  Recent ICD transmission shows decreased impedance which may be suggestive of increased fluid.  [place]. BP low 90's at times. Dr. Justin Mend - BP low that day. Only had one day of dizziness. Has not had any dizziness since. Last night she did have a GI upset. Because of this, we are not going to increase her torsemide. When Dr. Justin Mend saw her and she had lower blood pressure, there was consideration of decreasing her torsemide. I'm worried about doing this given her tenuous status of the last few months going in and out of the hospital 3 separate occasions. She currently has a blood pressure of 110/68, pulse of 90. We will continue current regimen.   Wt Readings from Last 3 Encounters:  04/18/16 228 lb 6.4 oz (103.6 kg)  02/29/16 224 lb (101.6 kg)  02/08/16 227 lb 9.6 oz  (103.2 kg)     Past Medical History:  Diagnosis Date  . Acute blood loss anemia   . Arthritis   . Chronic systolic heart failure ([place])   . CKD (chronic kidney disease), stage III 12/22/2015  . Depression 11/24/2015  . Hypertension   . Hypokalemia   . Myocardial infarction 2008  . Neuromuscular disorder ([place])   . Paroxysmal atrial fibrillation ([facility])   . Physical deconditioning   . Right flank hematoma   . Sleep apnea     Past Surgical History:  Procedure Laterality Date  . ABDOMINAL HYSTERECTOMY    . ANKLE SURGERY    . CARDIAC CATHETERIZATION    . CHOLECYSTECTOMY    . EP IMPLANTABLE DEVICE N/A 02/29/2016   Procedure: BIV ICD Generator Changeout;  Surgeon: Evans Lance, MD;  Location: [facility];  Service: Cardiovascular;  Laterality: N/A;  . EYE SURGERY    . INSERT / REPLACE / REMOVE PACEMAKER  2008  . KNEE LIGAMENT RECONSTRUCTION  2012  . TONSILLECTOMY      Current Outpatient Prescriptions  Medication Sig Dispense Refill  . acetaminophen (TYLENOL) 325 MG tablet Take 650 mg by mouth 2 (two) times daily as needed for mild pain or headache.     . allopurinol (ZYLOPRIM) 100 MG tablet Take 100 mg by mouth daily. Takes along with a 300 mg tablet to equal 400 mg    . allopurinol (ZYLOPRIM) 300 MG tablet Take 300 mg by mouth daily. Take with the 100mg  tablet to equal  400mg  daily    . apixaban (ELIQUIS) 5 MG TABS tablet Take 1 tablet (5 mg total) by mouth 2 (two) times daily. 14 tablet 1  . atorvastatin (LIPITOR) 10 MG tablet Take 1 tablet (10 mg total) by mouth daily at 6 PM. 90 tablet 2  . bisoprolol (ZEBETA) 5 MG tablet Take 0.5 tablets (2.5 mg total) by mouth at bedtime. 45 tablet 2  . Calcium Carbonate-Vit D-Min (CALCIUM/VITAMIN D/MINERALS) 600-200 MG-UNIT TABS Take 1 tablet by mouth daily.    . Cholecalciferol (VITAMIN D3) 1000 UNITS CAPS Take 1 capsule by mouth daily.    . colestipol (COLESTID) 1 G tablet Take 2 g by mouth at bedtime.     . gabapentin (NEURONTIN) 800  MG tablet Take 800 mg by mouth 3 (three) times daily.    Marland Kitchen guaiFENesin (MUCINEX) 600 MG 12 hr tablet Take 1 tablet (600 mg total) by mouth 2 (two) times daily.    . nitroGLYCERIN (NITROSTAT) 0.4 MG SL tablet Place 1 tablet (0.4 mg total) under the tongue every 5 (five) minutes as needed for chest pain. 25 tablet 6  . nortriptyline (PAMELOR) 25 MG capsule Take 25 mg by mouth at bedtime.    . OXYGEN Inhale 2 L/min into the lungs at bedtime as needed (uses as bedtime everynight and then occasionally iun the daytime as needed for SOB).     . potassium chloride SA (K-DUR,KLOR-CON) 20 MEQ tablet Take 2 tablets (40 mEq total) by mouth 2 (two) times daily. 360 tablet 2  . senna (SENOKOT) 8.6 MG TABS tablet Patient takes one tablet by mouth daily and may take a extra one if needed for constipation.    Marland Kitchen spironolactone (ALDACTONE) 25 MG tablet Take 12.5 mg by mouth daily.    Marland Kitchen torsemide (DEMADEX) 20 MG tablet Take 80 mg by mouth daily. Take additional 40 mg (2 tablets) in the afternoon on Tuesday, Thursday and Saturday     No current facility-administered medications for this visit.     Allergies:    Allergies  Allergen Reactions  . Quinapril Hcl Swelling    Tongue and throat  . Tape Other (See Comments)    Plastic tape causes irritation.   . Neosporin [Neomycin-Polymyxin-Gramicidin] Rash    Social History:  The patient  reports that she has quit smoking. She has quit using smokeless tobacco. She reports that she does not drink alcohol or use drugs.   ROS:  Please see the history of present illness. Neuropathy.  Positive for diarrhea, hearing loss, leg swelling, back pain, easy bruising, snoring, difficulty urinating, balance issues. Denies any bleeding, syncope, orthopnea, PND    PHYSICAL EXAM: VS:  BP 110/68   Pulse 90   Ht 5\' 6"  (1.676 m)   Wt 228 lb 6.4 oz (103.6 kg)   LMP  (LMP Unknown)   BMI 36.86 kg/m  Well nourished, well developed, in no acute distress  HEENT: normal  Neck: no JVD   Cardiac:  normal S1, S2; RRR; no murmur  Lungs:  clear to auscultation bilaterally, no wheezing, rhonchi or rales  Abd: soft, nontender, no hepatomegaly obese Ext: no edema  Skin: warm and dry  Neuro: no focal abnormalities noted  EKG:  Today 12/28/14-appears sinus rhythm low voltage P waves seems to be proceeding curette complex with ventricular pacing..    Labs: 07/20/14-triglycerides 332, LDL 80, HDL 39   ASSESSMENT AND PLAN:  1. Atrial fibrillation-currently controlled, chronic anticoagulation in place. CHADS-VASC - 5. Defibrillator/Pacemaker in place.  Dr. Tanna Furry note reviewed. no symptoms of palpitations.  2. Chronic anticoagulation-kidney with warfarin, INR goal 2-3. continue to monitor CBC and basic metabolic profile  3. Nonischemic Cardiomyopathy/chronic systolic heart failure-EF 35%. Medications reviewed. Currently well compensated. She has been well compensated for years. NYHA 2. Last hospitalization 11/2012. Watch her diet, sodium. She was drinking a tumbler of water frequently. We discussed. Appreciate assistance from advanced heart failure clinic. Given her relative hypotension, blood pressures in the 90s at times, I'm hesitant to use entresto. I will however continue her current torsemide dosage 80 mg daily with an additional 40 mg in the afternoon on Tuesday Thursday and Saturday. 4. History of ICD implantation, new device placed 02/29/16. - functioning well. Having monthly transmissions. 5. Hyperlipidemia/hypertriglyceridemia- had been on Lopid as well as pravastatin. Baseline triglycerides are greater than 500 which puts her at risk for pancreatitis. She is now taking atorvastatin 10 mg once a day. We will continue. Education reviewed. 6. Right knee osteoarthrosis  7. Morbid obesity-continue to encourage weight loss.  8. 6 month follow up  Signed, Candee Furbish, MD [facility]  04/18/2016 10:51 AM

## 2016-04-18 NOTE — Patient Instructions (Signed)
Medication Instructions:  The current medical regimen is effective;  continue present plan and medications.  Follow-Up: Follow up in 2 months with Katy Thompson, PA.  If you need a refill on your cardiac medications before your next appointment, please call your pharmacy.  Thank you for choosing [facility]!!     

## 2016-04-26 DIAGNOSIS — G4733 Obstructive sleep apnea (adult) (pediatric): Secondary | ICD-10-CM | POA: Diagnosis not present

## 2016-05-04 NOTE — Progress Notes (Signed)
ICM remote transmission rescheduled from 05/18/2016 to 07/02/2016 due to patient has in office defib check with Dr Lovena Le on 05/30/2016.

## 2016-05-11 DIAGNOSIS — G4733 Obstructive sleep apnea (adult) (pediatric): Secondary | ICD-10-CM | POA: Diagnosis not present

## 2016-05-30 ENCOUNTER — Ambulatory Visit (INDEPENDENT_AMBULATORY_CARE_PROVIDER_SITE_OTHER): Payer: PPO | Admitting: Internal Medicine

## 2016-05-30 ENCOUNTER — Encounter: Payer: Self-pay | Admitting: Internal Medicine

## 2016-05-30 VITALS — BP 126/64 | HR 88 | Ht 66.0 in | Wt 229.8 lb

## 2016-05-30 DIAGNOSIS — I5022 Chronic systolic (congestive) heart failure: Secondary | ICD-10-CM

## 2016-05-30 DIAGNOSIS — Z95810 Presence of automatic (implantable) cardiac defibrillator: Secondary | ICD-10-CM

## 2016-05-30 LAB — CUP PACEART INCLINIC DEVICE CHECK
Battery Voltage: 3.02 V
Brady Statistic AP VP Percent: 0.41 %
Brady Statistic AS VP Percent: 97.35 %
Brady Statistic AS VS Percent: 2.23 %
Date Time Interrogation Session: 20171213105646
HIGH POWER IMPEDANCE MEASURED VALUE: 44 Ohm
HIGH POWER IMPEDANCE MEASURED VALUE: 59 Ohm
Implantable Lead Implant Date: 20080821
Implantable Lead Location: 753859
Implantable Lead Model: 4194
Lead Channel Impedance Value: 380 Ohm
Lead Channel Impedance Value: 456 Ohm
Lead Channel Impedance Value: 532 Ohm
Lead Channel Impedance Value: 570 Ohm
Lead Channel Impedance Value: 817 Ohm
Lead Channel Pacing Threshold Amplitude: 0.5 V
Lead Channel Pacing Threshold Amplitude: 0.75 V
Lead Channel Pacing Threshold Pulse Width: 0.4 ms
Lead Channel Setting Pacing Amplitude: 2 V
Lead Channel Setting Pacing Amplitude: 2.5 V
Lead Channel Setting Sensing Sensitivity: 0.3 mV
MDC IDC LEAD IMPLANT DT: 20080821
MDC IDC LEAD IMPLANT DT: 20080821
MDC IDC LEAD LOCATION: 753858
MDC IDC LEAD LOCATION: 753860
MDC IDC MSMT BATTERY REMAINING LONGEVITY: 93 mo
MDC IDC MSMT LEADCHNL LV PACING THRESHOLD PULSEWIDTH: 1 ms
MDC IDC MSMT LEADCHNL RA IMPEDANCE VALUE: 456 Ohm
MDC IDC MSMT LEADCHNL RA SENSING INTR AMPL: 1.5 mV
MDC IDC MSMT LEADCHNL RA SENSING INTR AMPL: 2 mV
MDC IDC MSMT LEADCHNL RV PACING THRESHOLD AMPLITUDE: 0.5 V
MDC IDC MSMT LEADCHNL RV PACING THRESHOLD PULSEWIDTH: 0.4 ms
MDC IDC MSMT LEADCHNL RV SENSING INTR AMPL: 15.25 mV
MDC IDC MSMT LEADCHNL RV SENSING INTR AMPL: 18.125 mV
MDC IDC PG IMPLANT DT: 20170913
MDC IDC SET LEADCHNL LV PACING AMPLITUDE: 1.5 V
MDC IDC SET LEADCHNL LV PACING PULSEWIDTH: 0.4 ms
MDC IDC SET LEADCHNL RV PACING PULSEWIDTH: 0.4 ms
MDC IDC STAT BRADY AP VS PERCENT: 0.01 %
MDC IDC STAT BRADY RA PERCENT PACED: 0.42 %
MDC IDC STAT BRADY RV PERCENT PACED: 97.02 %

## 2016-05-30 NOTE — Patient Instructions (Addendum)
Medication Instructions:  Your physician recommends that you continue on your current medications as directed. Please refer to the Current Medication list given to you today.   Labwork: None ordered   Testing/Procedures: None ordered   Follow-Up: Your physician wants you to follow-up in: 9 months with Dr. Lovena Le. You will receive a reminder letter in the mail two months in advance. If you don't receive a letter, please call our office to schedule the follow-up appointment.  Remote monitoring is used to monitor your ICD from home. This monitoring reduces the number of office visits required to check your device to one time per year. It allows [place] to keep an eye on the functioning of your device to ensure it is working properly. You are scheduled for a device check from home on 08/29/16. You may send your transmission at any time that day. If you have a wireless device, the transmission will be sent automatically. After your physician reviews your transmission, you will receive a postcard with your next transmission date.    Any Other Special Instructions Will Be Listed Below (If Applicable).     If you need a refill on your cardiac medications before your next appointment, please call your pharmacy.

## 2016-05-30 NOTE — Progress Notes (Signed)
HPI Bianca [name] returns today for followup. She is a 82 yo woman with a h/o an ICM, chronic systolic CHF, LBBB, s/p BiV ICD implant. She has been in the hospital. She has had problems with volume overload. She had an episode of VT in June with successful ATP. She was asymptomatic. Allergies  Allergen Reactions  . Quinapril Hcl Swelling    Tongue and throat  . Tape Other (See Comments)    Plastic tape causes irritation.   . Neosporin [Neomycin-Polymyxin-Gramicidin] Rash     Current Outpatient Prescriptions  Medication Sig Dispense Refill  . acetaminophen (TYLENOL) 325 MG tablet Take 650 mg by mouth 2 (two) times daily as needed for mild pain or headache.     . allopurinol (ZYLOPRIM) 100 MG tablet Take 100 mg by mouth daily. Takes along with a 300 mg tablet to equal 400 mg    . allopurinol (ZYLOPRIM) 300 MG tablet Take 300 mg by mouth daily. Take with the 100mg  tablet to equal 400mg  daily    . apixaban (ELIQUIS) 5 MG TABS tablet Take 1 tablet (5 mg total) by mouth 2 (two) times daily. 14 tablet 1  . atorvastatin (LIPITOR) 10 MG tablet Take 1 tablet (10 mg total) by mouth daily at 6 PM. 90 tablet 2  . bisoprolol (ZEBETA) 5 MG tablet Take 0.5 tablets (2.5 mg total) by mouth at bedtime. 45 tablet 2  . Calcium Carbonate-Vit D-Min (CALCIUM/VITAMIN D/MINERALS) 600-200 MG-UNIT TABS Take 1 tablet by mouth daily.    . Cholecalciferol (VITAMIN D3) 1000 UNITS CAPS Take 1 capsule by mouth daily.    . colestipol (COLESTID) 1 G tablet Take 2 g by mouth at bedtime.     . gabapentin (NEURONTIN) 800 MG tablet Take 800 mg by mouth 3 (three) times daily.    Marland Kitchen guaiFENesin (MUCINEX) 600 MG 12 hr tablet Take 1 tablet (600 mg total) by mouth 2 (two) times daily.    . nitroGLYCERIN (NITROSTAT) 0.4 MG SL tablet Place 1 tablet (0.4 mg total) under the tongue every 5 (five) minutes as needed for chest pain. 25 tablet 6  . nortriptyline (PAMELOR) 25 MG capsule Take 25 mg by mouth at bedtime.    . OXYGEN Inhale 2  L/min into the lungs at bedtime as needed (uses as bedtime everynight and then occasionally iun the daytime as needed for SOB).     . potassium chloride SA (K-DUR,KLOR-CON) 20 MEQ tablet Take 2 tablets (40 mEq total) by mouth 2 (two) times daily. 360 tablet 2  . senna (SENOKOT) 8.6 MG TABS tablet Patient takes one tablet by mouth daily and may take a extra one if needed for constipation.    Marland Kitchen spironolactone (ALDACTONE) 25 MG tablet Take 12.5 mg by mouth daily.    Marland Kitchen torsemide (DEMADEX) 20 MG tablet Take 80 mg by mouth daily. Take additional 40 mg (2 tablets) in the afternoon on Tuesday, Thursday and Saturday     No current facility-administered medications for this visit.      Past Medical History:  Diagnosis Date  . Acute blood loss anemia   . Arthritis   . Chronic systolic heart failure ([place])   . CKD (chronic kidney disease), stage III 12/22/2015  . Depression 11/24/2015  . Hypertension   . Hypokalemia   . Myocardial infarction 2008  . Neuromuscular disorder ([place])   . Paroxysmal atrial fibrillation ([facility])   . Physical deconditioning   . Right flank hematoma   . Sleep apnea  ROS:   All systems reviewed and negative except as noted in the HPI.   Past Surgical History:  Procedure Laterality Date  . ABDOMINAL HYSTERECTOMY    . ANKLE SURGERY    . CARDIAC CATHETERIZATION    . CHOLECYSTECTOMY    . EP IMPLANTABLE DEVICE N/A 02/29/2016   Procedure: BIV ICD Generator Changeout;  Surgeon: Evans Lance, MD;  Location: [name];  Service: Cardiovascular;  Laterality: N/A;  . EYE SURGERY    . INSERT / REPLACE / REMOVE PACEMAKER  2008  . KNEE LIGAMENT RECONSTRUCTION  2012  . TONSILLECTOMY       Family History  Problem Relation Age of Onset  . Stroke Mother   . Congestive Heart Failure Mother   . Tuberculosis Father   . Diabetes Brother   . Stroke Brother   . Cancer Brother   . Heart attack Son      Social History   Social History  . Marital status: Widowed     Spouse name: N/A  . Number of children: N/A  . Years of education: N/A   Occupational History  . Not on file.   Social History Main Topics  . Smoking status: Former Research scientist (life sciences)  . Smokeless tobacco: Former Systems developer  . Alcohol use No  . Drug use: No  . Sexual activity: Not Currently   Other Topics Concern  . Not on file   Social History Narrative  . No narrative on file     BP 126/64   Pulse 88   Ht 5\' 6"  (1.676 m)   Wt 229 lb 12.8 oz (104.2 kg)   LMP  (LMP Unknown)   BMI 37.09 kg/m   Physical Exam:  Well appearing 82 yo woman, NAD HEENT: Unremarkable Neck:  7 cm JVD, no thyromegally Lungs:  Clear with no wheezes. HEART:  Regular rate rhythm, no murmurs, no rubs, no clicks Abd:  soft, positive bowel sounds, no organomegally, no rebound, no guarding Ext:  2 plus pulses, no edema, no cyanosis, no clubbing Skin:  No rashes no nodules Neuro:  CN II through XII intact, motor grossly intact   DEVICE  Normal device function.  See PaceArt for details.   Assess/Plan: 1. Chronic systolic heart failure - Her symptoms are class 2. She has had some volume overload. She will continue her Torsemide. 2. VT - she has had no recurrent VT. Will continue her current meds. 3. CAD - she denies anginal symptoms. Will follow. 4. ICD - her Medtronic BiV ICD is working normally. Will recheck in several months.  Mikle Bosworth.D.

## 2016-06-10 DIAGNOSIS — G4733 Obstructive sleep apnea (adult) (pediatric): Secondary | ICD-10-CM | POA: Diagnosis not present

## 2016-07-01 DIAGNOSIS — R05 Cough: Secondary | ICD-10-CM | POA: Diagnosis not present

## 2016-07-02 ENCOUNTER — Ambulatory Visit (INDEPENDENT_AMBULATORY_CARE_PROVIDER_SITE_OTHER): Payer: PPO

## 2016-07-02 DIAGNOSIS — Z95810 Presence of automatic (implantable) cardiac defibrillator: Secondary | ICD-10-CM | POA: Diagnosis not present

## 2016-07-02 DIAGNOSIS — I5022 Chronic systolic (congestive) heart failure: Secondary | ICD-10-CM

## 2016-07-02 NOTE — Progress Notes (Signed)
EPIC Encounter for ICM Monitoring  Patient Name: Bianca [name] is a 83 y.o. female Date: 07/02/2016 Primary Care Physican: Jonathon Bellows, MD Primary Cardiologist: Marlou Porch Electrophysiologist: Lovena Le Dry Weight:    unknown Bi-V Pacing:  97.9%           Heart Failure questions reviewed, pt asymptomatic for fluid symptoms but does have a bad cold.  She is on a z pack and saw PCP on 07/01/2016.   PCP said her lungs were clear.  Thoracic impedance normal.  Labs: 02/24/2016  Creatinine 1.01, BUN 21, Potassium 4.0, Sodium 138  01/01/2016 Creatinine 1.02, BUN 38, Potassium 3.9, Sodium 133  12/31/2015 Creatinine 1.10, BUN 31, Potassium 3.6, Sodium 140  12/30/2015 Creatinine 1.02, BUN 25, Potassium 3.4, Sodium 137  12/29/2015 Creatinine 0.84, BUN 27, Potassium 3.0, Sodium 136  12/28/2015 Creatinine 0.83, BUN 26, Potassium 3.4, Sodium 138  12/27/2015 Creatinine 0.96, BUN 2.6 Potassium 3.3, Sodium 137  12/27/2015 Creatinine 0.86, BUN 29, Potassium 4.9, Sodium 134  12/26/2015 Creatinine 1.30, BUN 31, Potassium 3.9, Sodium 134  12/25/2015 Creatinine 1.25, BUN 33, Potassium 3.9, Sodium 134  12/24/2015 Creatinine 1.22, BUN 25, Potassium 3.9, Sodium 136  12/23/2015 Creatinine 1.11, BUN 19, Potassium 4.0, Sodium 138  12/22/2015 Creatinine 1.02, BUN 15, Potassium 3.1, Sodium 139  12/21/2015 Creatinine 1.17, BUN 18, Potassium 3.0, Sodium 138   Recommendations: No changes. Discussed limiting dietary salt intake to 2000 mg/day and fluid intake to < 2 liters per day. Encouraged to call for fluid symptoms.  Follow-up plan: ICM clinic phone appointment on 07/17/2016 to check fluids again to provide updated transmission for before the office visit with Marita Snellen, PA on 07/19/2016  Copy of ICM check sent to cardiologist and device physician.   3 month ICM trend: 07/02/2016   1 Year ICM trend:      Rosalene Billings, RN 07/02/2016 11:34 AM

## 2016-07-04 ENCOUNTER — Other Ambulatory Visit (HOSPITAL_COMMUNITY): Payer: Self-pay | Admitting: Pharmacist

## 2016-07-04 MED ORDER — SPIRONOLACTONE 25 MG PO TABS: 12.5000 mg | ORAL_TABLET | Freq: Every day | ORAL | 3 refills | Status: DC

## 2016-07-04 MED ORDER — POTASSIUM CHLORIDE CRYS ER 20 MEQ PO TBCR: 40.0000 meq | EXTENDED_RELEASE_TABLET | Freq: Two times a day (BID) | ORAL | 3 refills | Status: DC

## 2016-07-04 MED ORDER — BISOPROLOL FUMARATE 5 MG PO TABS: 2.5000 mg | ORAL_TABLET | Freq: Every day | ORAL | 3 refills | Status: DC

## 2016-07-04 MED ORDER — ATORVASTATIN CALCIUM 10 MG PO TABS: 10.0000 mg | ORAL_TABLET | Freq: Every day | ORAL | 3 refills | Status: DC

## 2016-07-04 MED ORDER — TORSEMIDE 20 MG PO TABS: 80.0000 mg | ORAL_TABLET | Freq: Every day | ORAL | 3 refills | Status: DC

## 2016-07-09 ENCOUNTER — Other Ambulatory Visit: Payer: Self-pay | Admitting: Family Medicine

## 2016-07-09 ENCOUNTER — Ambulatory Visit
Admission: RE | Admit: 2016-07-09 | Discharge: 2016-07-09 | Disposition: A | Discharge status: Home / Self Care | Payer: PPO | Source: Ambulatory Visit | Attending: Family Medicine | Admitting: Family Medicine

## 2016-07-09 DIAGNOSIS — R05 Cough: Secondary | ICD-10-CM

## 2016-07-09 DIAGNOSIS — R059 Cough, unspecified: Secondary | ICD-10-CM

## 2016-07-11 DIAGNOSIS — G4733 Obstructive sleep apnea (adult) (pediatric): Secondary | ICD-10-CM | POA: Diagnosis not present

## 2016-07-17 ENCOUNTER — Ambulatory Visit (INDEPENDENT_AMBULATORY_CARE_PROVIDER_SITE_OTHER): Payer: PPO

## 2016-07-17 ENCOUNTER — Telehealth: Payer: Self-pay

## 2016-07-17 DIAGNOSIS — I5022 Chronic systolic (congestive) heart failure: Secondary | ICD-10-CM

## 2016-07-17 DIAGNOSIS — Z95810 Presence of automatic (implantable) cardiac defibrillator: Secondary | ICD-10-CM

## 2016-07-17 NOTE — Telephone Encounter (Signed)
Remote ICM transmission received.  Attempted patient call and left detailed message regarding transmission and next ICM scheduled for 08/03/2016.  Advised to return call for any fluid symptoms or questions.

## 2016-07-17 NOTE — Progress Notes (Signed)
EPIC Encounter for ICM Monitoring  Patient Name: Bianca [name] is a 83 y.o. female Date: 07/17/2016 Primary Care Physican: Jonathon Bellows, MD Primary Cardiologist:[place] Electrophysiologist: Druscilla Brownie Weight:unknown Bi-V Pacing: 98.4%      Attempted call to patient and unable to reach.  Left detailed message regarding transmission.  Transmission reviewed.   Thoracic impedance slightly below normal baseline within the last day.   Labs: 09/08/2017Creatinine 1.01, BUN 21, Potassium 4.0, Sodium 138  01/01/2016 Creatinine 1.02, BUN 38, Potassium 3.9, Sodium 133  12/31/2015 Creatinine 1.10, BUN 31, Potassium 3.6, Sodium 140  12/30/2015 Creatinine 1.02, BUN 25, Potassium 3.4, Sodium 137  12/29/2015 Creatinine 0.84, BUN 27, Potassium 3.0, Sodium 136  12/28/2015 Creatinine 0.83, BUN 26, Potassium 3.4, Sodium 138  12/27/2015 Creatinine 0.96, BUN 2.6Potassium 3.3, Sodium 137  12/27/2015 Creatinine 0.86, BUN 29, Potassium 4.9, Sodium 134  12/26/2015 Creatinine 1.30, BUN 31, Potassium 3.9, Sodium 134  12/25/2015 Creatinine 1.25, BUN 33, Potassium 3.9, Sodium 134  12/24/2015 Creatinine 1.22, BUN 25, Potassium 3.9, Sodium 136  12/23/2015 Creatinine 1.11, BUN 19, Potassium 4.0, Sodium 138  12/22/2015 Creatinine 1.02, BUN 15, Potassium 3.1, Sodium 139  12/21/2015 Creatinine 1.17, BUN 18, Potassium 3.0, Sodium 138   Recommendations:   NONE - Unable to reach patient   Follow-up plan: ICM clinic phone appointment on 08/03/2016.  Office visit with Angelena Form, PA  07/19/2016  Copy of ICM check sent to primary cardiologist and device physician.   3 month ICM trend: 07/17/2016   1 Year ICM trend:      Rosalene Billings, RN 07/17/2016 11:55 AM

## 2016-07-19 ENCOUNTER — Ambulatory Visit (INDEPENDENT_AMBULATORY_CARE_PROVIDER_SITE_OTHER): Payer: PPO | Admitting: Cardiology

## 2016-07-19 ENCOUNTER — Encounter: Payer: Self-pay | Admitting: Cardiology

## 2016-07-19 VITALS — BP 110/72 | HR 83 | Ht 66.0 in | Wt 226.8 lb

## 2016-07-19 DIAGNOSIS — I5022 Chronic systolic (congestive) heart failure: Secondary | ICD-10-CM | POA: Diagnosis not present

## 2016-07-19 DIAGNOSIS — I429 Cardiomyopathy, unspecified: Secondary | ICD-10-CM

## 2016-07-19 DIAGNOSIS — Z95810 Presence of automatic (implantable) cardiac defibrillator: Secondary | ICD-10-CM

## 2016-07-19 DIAGNOSIS — Z7901 Long term (current) use of anticoagulants: Secondary | ICD-10-CM

## 2016-07-19 DIAGNOSIS — I480 Paroxysmal atrial fibrillation: Secondary | ICD-10-CM

## 2016-07-19 NOTE — Progress Notes (Signed)
Cardiology Office Note   Date:  07/19/2016   ID:  Bianca [name], DOB [birth year], MRN [id number]  PCP:  Jonathon Bellows, MD  Cardiologist:  Dr. Marlou Porch EP Dr. Lovena Le     Chief Complaint  Patient presents with  . Congestive Heart Failure    hx of  . Edema    pt states a little in ankles and legs       History of Present Illness: Bianca [name] is a 83 y.o. female who presents for chronic systolic HF.  Recent eval of ICD with no increase of fluid.  She has a h/o an ICM, chronic systolic CHF, LBBB, s/p BiV ICD implant.  Hx of VT, CAD  Chronic anticoagulation on Eliquis,  , CKD-3, with EF 35%. Symptoms have been very stable. NYHA class II except for hospitalization for heart failure she was discharged on 11/24/12 with primary diagnosis of acute on chronic systolic heart failure. She was admitted with fluid overload, shortness of breath and diuresed successfully.  Hx of cath 2008 for decreased LV systolic function.  No CAD except for minor irregularities. EF at that time was 25-30%.  Improved with BIV.   CPAP using.   Today she is recovering from bronchitis, treated with 2 rounds of antibiotics.  Her arthritis is really bothering her.  She was at rest with her bronchitis and now stiff especially in the knees.  She is taking tylenol arthritis.  She is on Eliquis. No bleeding. Only SOB was with her bronchitis.  No chest pain.   Past Medical History:  Diagnosis Date  . Acute blood loss anemia   . Arthritis   . Chronic systolic heart failure ([place])   . CKD (chronic kidney disease), stage III 12/22/2015  . Depression 11/24/2015  . Hypertension   . Hypokalemia   . Myocardial infarction 2008  . Neuromuscular disorder ([place])   . Paroxysmal atrial fibrillation ([facility])   . Physical deconditioning   . Right flank hematoma   . Sleep apnea     Past Surgical History:  Procedure Laterality Date  . ABDOMINAL HYSTERECTOMY    . ANKLE SURGERY    . CARDIAC CATHETERIZATION    . CHOLECYSTECTOMY    .  EP IMPLANTABLE DEVICE N/A 02/29/2016   Procedure: BIV ICD Generator Changeout;  Surgeon: Evans Lance, MD;  Location: [facility];  Service: Cardiovascular;  Laterality: N/A;  . EYE SURGERY    . INSERT / REPLACE / REMOVE PACEMAKER  2008  . KNEE LIGAMENT RECONSTRUCTION  2012  . TONSILLECTOMY       Current Outpatient Prescriptions  Medication Sig Dispense Refill  . acetaminophen (TYLENOL) 325 MG tablet Take 650 mg by mouth 2 (two) times daily as needed for mild pain or headache.     . allopurinol (ZYLOPRIM) 100 MG tablet Take 100 mg by mouth daily. Takes along with a 300 mg tablet to equal 400 mg    . allopurinol (ZYLOPRIM) 300 MG tablet Take 300 mg by mouth daily. Take with the 100mg  tablet to equal 400mg  daily    . apixaban (ELIQUIS) 5 MG TABS tablet Take 1 tablet (5 mg total) by mouth 2 (two) times daily. 14 tablet 1  . atorvastatin (LIPITOR) 10 MG tablet Take 1 tablet (10 mg total) by mouth daily at 6 PM. 90 tablet 3  . bisoprolol (ZEBETA) 5 MG tablet Take 0.5 tablets (2.5 mg total) by mouth at bedtime. 45 tablet 3  . Calcium Carbonate-Vit D-Min (CALCIUM/VITAMIN  D/MINERALS) 600-200 MG-UNIT TABS Take 1 tablet by mouth daily.    . Cholecalciferol (VITAMIN D3) 1000 UNITS CAPS Take 1 capsule by mouth daily.    . colestipol (COLESTID) 1 G tablet Take 2 g by mouth at bedtime.     . gabapentin (NEURONTIN) 800 MG tablet Take 800 mg by mouth 3 (three) times daily.    Marland Kitchen guaiFENesin (MUCINEX) 600 MG 12 hr tablet Take 1 tablet (600 mg total) by mouth 2 (two) times daily.    . nitroGLYCERIN (NITROSTAT) 0.4 MG SL tablet Place 1 tablet (0.4 mg total) under the tongue every 5 (five) minutes as needed for chest pain. 25 tablet 6  . nortriptyline (PAMELOR) 25 MG capsule Take 25 mg by mouth at bedtime.    . OXYGEN Inhale 2 L/min into the lungs at bedtime as needed (uses as bedtime everynight and then occasionally iun the daytime as needed for SOB).     . potassium chloride SA (K-DUR,KLOR-CON) 20 MEQ  tablet Take 2 tablets (40 mEq total) by mouth 2 (two) times daily. 360 tablet 3  . senna (SENOKOT) 8.6 MG TABS tablet Patient takes one tablet by mouth daily and may take a extra one if needed for constipation.    Marland Kitchen spironolactone (ALDACTONE) 25 MG tablet Take 0.5 tablets (12.5 mg total) by mouth daily. 45 tablet 3  . torsemide (DEMADEX) 20 MG tablet Take 4 tablets (80 mg total) by mouth daily. Take additional 40 mg (2 tablets) in the afternoon on Tuesday, Thursday and Saturday 440 tablet 3   No current facility-administered medications for this visit.     Allergies:   Quinapril hcl; Tape; and Neosporin [neomycin-polymyxin-gramicidin]    Social History:  The patient  reports that she has quit smoking. She has quit using smokeless tobacco. She reports that she does not drink alcohol or use drugs.   Family History:  The patient's family history includes Cancer in her brother; Congestive Heart Failure in her mother; Diabetes in her brother; Heart attack in her son; Stroke in her brother and mother; Tuberculosis in her father.    ROS:  General:no colds or fevers, no weight changes Skin:no rashes or ulcers HEENT:no blurred vision, no congestion CV:see HPI PUL:see HPI GI:no diarrhea constipation or melena, no indigestion GU:no hematuria, no dysuria MS:+ joint pain due to arthritis, no claudication Neuro:no syncope, no lightheadedness Endo:no diabetes, no thyroid disease  Wt Readings from Last 3 Encounters:  07/19/16 226 lb 12.8 oz (102.9 kg)  05/30/16 229 lb 12.8 oz (104.2 kg)  04/18/16 228 lb 6.4 oz (103.6 kg)     PHYSICAL EXAM: VS:  BP 110/72   Pulse 83   Ht 5\' 6"  (1.676 m)   Wt 226 lb 12.8 oz (102.9 kg)   LMP  (LMP Unknown)   SpO2 92%   BMI 36.61 kg/m  , BMI Body mass index is 36.61 kg/m. General:Pleasant affect, NAD Skin:Warm and dry, brisk capillary refill HEENT:normocephalic, sclera clear, mucus membranes moist Neck:supple, no JVD, no bruits  Heart:S1S2 RRR without  murmur, gallup, rub or click Lungs:clear without rales, rhonchi, or wheezes [id number], non tender, + BS, do not palpate liver spleen or masses Ext:no lower ext edema, 2+ pedal pulses, 2+ radial pulses Neuro:alert and oriented X 3, MAE, follows commands, + facial symmetry    EKG:  EKG is NOT  ordered today.    Recent Labs: 12/05/2015: ALT 14 12/21/2015: B Natriuretic Peptide 948.0 12/25/2015: TSH 6.624 12/30/2015: Magnesium 2.0 02/24/2016: BUN 21; Creat 1.01;  Hemoglobin 12.8; Platelets 206; Potassium 4.0; Sodium 138    Lipid Panel    Component Value Date/Time   CHOL 158 07/20/2014 1149   TRIG 332.0 (H) 07/20/2014 1149   HDL 38.60 (L) 07/20/2014 1149   CHOLHDL 4 07/20/2014 1149   VLDL 66.4 (H) 07/20/2014 1149   LDLCALC 73 09/30/2013 0948   LDLDIRECT 80.0 07/20/2014 1149       Other studies Reviewed: Additional studies/ records that were reviewed today include: . ECHO:  - Left ventricle: The cavity size was mildly dilated. Systolic function was severely reduced. The estimated ejection fraction was in the range of 20% to 25%. Wall motion was normal; there were no regional wall motion abnormalities. - Mitral valve: Calcified annulus. Mild regurgitation. - Left atrium: The atrium was mildly to moderately dilated. - Pulmonary arteries: Systolic pressure was mildly increased. PA peak pressure: 30mm Hg (S).  ASSESSMENT AND PLAN:  1.  Chronic systolic HF euvolemic today, no change in diuretic.  Reviewed signs of volume overload.  Follow up with Dr. Marlou Porch in 6 months.     2.  HLD on lipitor.  3.  PAF on anticoagulation without bleeding and maintaining SR.  4.  BIV ICD with new battery 02/2016, followed by Dr. Lovena Le  5. Recent bronchitis resolving.  6. Arthritis knees, taking tylenol arthritis.    Current medicines are reviewed with the patient today.  The patient Has no concerns regarding medicines.  The following changes have been made:  See above Labs/ tests  ordered today include:see above  Disposition:   FU:  see above  Signed, [name], NP  07/19/2016 12:06 PM    [facility], [place], [address] [place], [place] Phone: [phone]; Fax: [phone]

## 2016-07-19 NOTE — Patient Instructions (Addendum)
Medication Instructions:  Your physician recommends that you continue on your current medications as directed. Please refer to the Current Medication list given to you today.  Patient can take 1 aleve daily only three days a week.   Labwork: -None  Testing/Procedures: -None  Follow-Up: Your physician wants you to follow-up in: 6 months with Dr. Marlou Porch.  You will receive a reminder letter in the mail two months in advance. If you don't receive a letter, please call our office to schedule the follow-up appointment.   Any Other Special Instructions Will Be Listed Below (If Applicable).     If you need a refill on your cardiac medications before your next appointment, please call your pharmacy.

## 2016-07-23 DIAGNOSIS — M1712 Unilateral primary osteoarthritis, left knee: Secondary | ICD-10-CM | POA: Diagnosis not present

## 2016-07-23 DIAGNOSIS — M170 Bilateral primary osteoarthritis of knee: Secondary | ICD-10-CM | POA: Diagnosis not present

## 2016-07-23 DIAGNOSIS — G8929 Other chronic pain: Secondary | ICD-10-CM | POA: Diagnosis not present

## 2016-07-23 DIAGNOSIS — M1711 Unilateral primary osteoarthritis, right knee: Secondary | ICD-10-CM | POA: Diagnosis not present

## 2016-07-23 DIAGNOSIS — M25562 Pain in left knee: Secondary | ICD-10-CM | POA: Diagnosis not present

## 2016-07-23 DIAGNOSIS — M25561 Pain in right knee: Secondary | ICD-10-CM | POA: Diagnosis not present

## 2016-08-03 ENCOUNTER — Telehealth: Payer: Self-pay

## 2016-08-03 ENCOUNTER — Ambulatory Visit (INDEPENDENT_AMBULATORY_CARE_PROVIDER_SITE_OTHER): Payer: PPO

## 2016-08-03 DIAGNOSIS — I5022 Chronic systolic (congestive) heart failure: Secondary | ICD-10-CM | POA: Diagnosis not present

## 2016-08-03 DIAGNOSIS — Z95810 Presence of automatic (implantable) cardiac defibrillator: Secondary | ICD-10-CM | POA: Diagnosis not present

## 2016-08-03 NOTE — Progress Notes (Signed)
EPIC Encounter for ICM Monitoring  Patient Name: Bianca [name] is a 83 y.o. female Date: 08/03/2016 Primary Care Physican: Jonathon Bellows, MD Primary Cardiologist:Skains Electrophysiologist: Druscilla Brownie Weight:unknown Bi-V Pacing: 97.9%                                           Attempted call to patient and unable to reach.  Left message to return call.  Transmission reviewed.    Thoracic impedance normal.  Current prescribed dose of Torsemide 20 mg 4 tablets (80 mg total) every day and take 2 tablets (40 mg total) every Tues, Thurs & Sat.  Potassium 20 mEq 2 tablets (40 mg total) bid  Labs: 09/08/2017Creatinine 1.01, BUN 21, Potassium 4.0, Sodium 138  01/01/2016 Creatinine 1.02, BUN 38, Potassium 3.9, Sodium 133  12/31/2015 Creatinine 1.10, BUN 31, Potassium 3.6, Sodium 140  12/30/2015 Creatinine 1.02, BUN 25, Potassium 3.4, Sodium 137  12/29/2015 Creatinine 0.84, BUN 27, Potassium 3.0, Sodium 136  12/28/2015 Creatinine 0.83, BUN 26, Potassium 3.4, Sodium 138  12/27/2015 Creatinine 0.96, BUN 2.6Potassium 3.3, Sodium 137  12/27/2015 Creatinine 0.86, BUN 29, Potassium 4.9, Sodium 134  12/26/2015 Creatinine 1.30, BUN 31, Potassium 3.9, Sodium 134  12/25/2015 Creatinine 1.25, BUN 33, Potassium 3.9, Sodium 134  12/24/2015 Creatinine 1.22, BUN 25, Potassium 3.9, Sodium 136  12/23/2015 Creatinine 1.11, BUN 19, Potassium 4.0, Sodium 138  12/22/2015 Creatinine 1.02, BUN 15, Potassium 3.1, Sodium 139  12/21/2015 Creatinine 1.17, BUN 18, Potassium 3.0, Sodium 138   Recommendations: NONE - Unable to reach patient   Follow-up plan: ICM clinic phone appointment on 09/03/2016.  Copy of ICM check sent to device physician.   3 month ICM trend: 08/03/2016   1 Year ICM trend:      Rosalene Billings, RN 08/03/2016 10:47 AM

## 2016-08-03 NOTE — Telephone Encounter (Signed)
Remote ICM transmission received.  Attempted patient call and left message to return call.   

## 2016-08-11 DIAGNOSIS — G4733 Obstructive sleep apnea (adult) (pediatric): Secondary | ICD-10-CM | POA: Diagnosis not present

## 2016-08-17 DIAGNOSIS — M1712 Unilateral primary osteoarthritis, left knee: Secondary | ICD-10-CM | POA: Diagnosis not present

## 2016-08-17 DIAGNOSIS — M25562 Pain in left knee: Secondary | ICD-10-CM | POA: Diagnosis not present

## 2016-08-17 DIAGNOSIS — G8929 Other chronic pain: Secondary | ICD-10-CM | POA: Diagnosis not present

## 2016-08-17 DIAGNOSIS — R197 Diarrhea, unspecified: Secondary | ICD-10-CM | POA: Diagnosis not present

## 2016-08-23 DIAGNOSIS — M170 Bilateral primary osteoarthritis of knee: Secondary | ICD-10-CM | POA: Diagnosis not present

## 2016-08-30 DIAGNOSIS — M170 Bilateral primary osteoarthritis of knee: Secondary | ICD-10-CM | POA: Diagnosis not present

## 2016-09-03 ENCOUNTER — Ambulatory Visit (INDEPENDENT_AMBULATORY_CARE_PROVIDER_SITE_OTHER): Payer: PPO

## 2016-09-03 ENCOUNTER — Telehealth: Payer: Self-pay

## 2016-09-03 DIAGNOSIS — Z95810 Presence of automatic (implantable) cardiac defibrillator: Secondary | ICD-10-CM | POA: Diagnosis not present

## 2016-09-03 DIAGNOSIS — I5022 Chronic systolic (congestive) heart failure: Secondary | ICD-10-CM | POA: Diagnosis not present

## 2016-09-03 NOTE — Progress Notes (Signed)
EPIC Encounter for ICM Monitoring  Patient Name: Bianca [name] is a 83 y.o. female Date: 09/03/2016 Primary Care Physican: Jonathon Bellows, MD Primary Cardiologist:Skains Electrophysiologist: Druscilla Brownie Weight:unknown Bi-V Pacing: 97.6%      Attempted call to patient and unable to reach.  Left message to return call.  Transmission reviewed.    Thoracic impedance abnormal suggesting fluid accumulation since 08/24/2016 but trending toward baseline today.  Current prescribed dose of Torsemide 20 mg 4 tablets (80 mg total) every day and take 2 tablets (40 mg total) every Tues, Thurs & Sat.  Potassium 20 mEq 2 tablets (40 mg total) bid  Labs: 09/08/2017Creatinine 1.01, BUN 21, Potassium 4.0, Sodium 138  01/01/2016 Creatinine 1.02, BUN 38, Potassium 3.9, Sodium 133  12/31/2015 Creatinine 1.10, BUN 31, Potassium 3.6, Sodium 140  12/30/2015 Creatinine 1.02, BUN 25, Potassium 3.4, Sodium 137  12/29/2015 Creatinine 0.84, BUN 27, Potassium 3.0, Sodium 136  12/28/2015 Creatinine 0.83, BUN 26, Potassium 3.4, Sodium 138  12/27/2015 Creatinine 0.96, BUN 2.6Potassium 3.3, Sodium 137  12/27/2015 Creatinine 0.86, BUN 29, Potassium 4.9, Sodium 134  12/26/2015 Creatinine 1.30, BUN 31, Potassium 3.9, Sodium 134  12/25/2015 Creatinine 1.25, BUN 33, Potassium 3.9, Sodium 134  12/24/2015 Creatinine 1.22, BUN 25, Potassium 3.9, Sodium 136  12/23/2015 Creatinine 1.11, BUN 19, Potassium 4.0, Sodium 138  12/22/2015 Creatinine 1.02, BUN 15, Potassium 3.1, Sodium 139  12/21/2015 Creatinine 1.17, BUN 18, Potassium 3.0, Sodium 138   Recommendations: NONE - Unable to reach patient   Follow-up plan: ICM clinic phone appointment on 10/04/2016.  Copy of ICM check sent to device physician.   3 month ICM trend: 09/03/2016   1 Year ICM trend:     Rosalene Billings, RN 09/03/2016 9:18 AM

## 2016-09-03 NOTE — Telephone Encounter (Signed)
Remote ICM transmission received.  Attempted patient call and left message to return call.   

## 2016-09-04 ENCOUNTER — Telehealth: Payer: Self-pay

## 2016-09-04 NOTE — Telephone Encounter (Signed)
Returned patient call and left message regarding transmission.

## 2016-09-06 ENCOUNTER — Ambulatory Visit: Payer: Self-pay | Admitting: Pharmacist

## 2016-09-06 DIAGNOSIS — G8929 Other chronic pain: Secondary | ICD-10-CM | POA: Diagnosis not present

## 2016-09-06 DIAGNOSIS — M25561 Pain in right knee: Secondary | ICD-10-CM | POA: Diagnosis not present

## 2016-09-06 DIAGNOSIS — Z5181 Encounter for therapeutic drug level monitoring: Secondary | ICD-10-CM

## 2016-09-06 DIAGNOSIS — I4891 Unspecified atrial fibrillation: Secondary | ICD-10-CM

## 2016-09-06 DIAGNOSIS — M1711 Unilateral primary osteoarthritis, right knee: Secondary | ICD-10-CM | POA: Diagnosis not present

## 2016-09-08 DIAGNOSIS — G4733 Obstructive sleep apnea (adult) (pediatric): Secondary | ICD-10-CM | POA: Diagnosis not present

## 2016-09-10 NOTE — Progress Notes (Signed)
Attempted another return call but no answer

## 2016-09-10 NOTE — Progress Notes (Signed)
Attempted 3rd call back as requested by patient and left detailed message regarding transmission.

## 2016-09-26 ENCOUNTER — Other Ambulatory Visit (HOSPITAL_COMMUNITY): Payer: Self-pay | Admitting: Adult Health

## 2016-09-27 DIAGNOSIS — H1859 Other hereditary corneal dystrophies: Secondary | ICD-10-CM | POA: Diagnosis not present

## 2016-09-27 DIAGNOSIS — Z961 Presence of intraocular lens: Secondary | ICD-10-CM | POA: Diagnosis not present

## 2016-09-27 DIAGNOSIS — H5203 Hypermetropia, bilateral: Secondary | ICD-10-CM | POA: Diagnosis not present

## 2016-09-27 DIAGNOSIS — H26493 Other secondary cataract, bilateral: Secondary | ICD-10-CM | POA: Diagnosis not present

## 2016-09-27 DIAGNOSIS — H52223 Regular astigmatism, bilateral: Secondary | ICD-10-CM | POA: Diagnosis not present

## 2016-10-04 ENCOUNTER — Telehealth: Payer: Self-pay | Admitting: Cardiology

## 2016-10-04 ENCOUNTER — Ambulatory Visit (INDEPENDENT_AMBULATORY_CARE_PROVIDER_SITE_OTHER): Payer: PPO

## 2016-10-04 DIAGNOSIS — Z95810 Presence of automatic (implantable) cardiac defibrillator: Secondary | ICD-10-CM

## 2016-10-04 DIAGNOSIS — I5022 Chronic systolic (congestive) heart failure: Secondary | ICD-10-CM | POA: Diagnosis not present

## 2016-10-04 NOTE — Telephone Encounter (Signed)
LMOVM reminding pt to send remote transmission.   

## 2016-10-05 ENCOUNTER — Ambulatory Visit (INDEPENDENT_AMBULATORY_CARE_PROVIDER_SITE_OTHER): Payer: PPO | Admitting: *Deleted

## 2016-10-05 ENCOUNTER — Telehealth: Payer: Self-pay

## 2016-10-05 DIAGNOSIS — I429 Cardiomyopathy, unspecified: Secondary | ICD-10-CM | POA: Diagnosis not present

## 2016-10-05 DIAGNOSIS — E0829 Diabetes mellitus due to underlying condition with other diabetic kidney complication: Secondary | ICD-10-CM | POA: Diagnosis not present

## 2016-10-05 NOTE — Telephone Encounter (Signed)
Remote ICM transmission received.  Attempted patient call and left detailed message regarding transmission and next ICM scheduled for 11/05/2016.  Advised to return call for any fluid symptoms or questions.    

## 2016-10-05 NOTE — Progress Notes (Signed)
EPIC Encounter for ICM Monitoring  Patient Name: Bianca [name] is a 83 y.o. female Date: 10/05/2016 Primary Care Physican: Jonathon Bellows, MD Primary Cardiologist:Skains Electrophysiologist: Druscilla Brownie Weight:unknown Bi-V Pacing: 98.5%          Attempted call to patient and unable to reach.  Left detailed message regarding transmission.  Transmission reviewed.    Thoracic impedance normal (slightly below baseline).  Current prescribed dose of Torsemide 20 mg 4 tablets (80 mg total) every day and take 2 tablets (40 mg total) every Tues, Thurs &Sat. Potassium 20 mEq 2 tablets (40 mg total) bid  Labs: 09/08/2017Creatinine 1.01, BUN 21, Potassium 4.0, Sodium 138  01/01/2016 Creatinine 1.02, BUN 38, Potassium 3.9, Sodium 133  12/31/2015 Creatinine 1.10, BUN 31, Potassium 3.6, Sodium 140  12/30/2015 Creatinine 1.02, BUN 25, Potassium 3.4, Sodium 137  12/29/2015 Creatinine 0.84, BUN 27, Potassium 3.0, Sodium 136  12/28/2015 Creatinine 0.83, BUN 26, Potassium 3.4, Sodium 138  12/27/2015 Creatinine 0.96, BUN 2.6Potassium 3.3, Sodium 137  12/27/2015 Creatinine 0.86, BUN 29, Potassium 4.9, Sodium 134  12/26/2015 Creatinine 1.30, BUN 31, Potassium 3.9, Sodium 134  12/25/2015 Creatinine 1.25, BUN 33, Potassium 3.9, Sodium 134  12/24/2015 Creatinine 1.22, BUN 25, Potassium 3.9, Sodium 136  12/23/2015 Creatinine 1.11, BUN 19, Potassium 4.0, Sodium 138  12/22/2015 Creatinine 1.02, BUN 15, Potassium 3.1, Sodium 139  12/21/2015 Creatinine 1.17, BUN 18, Potassium 3.0, Sodium 138   Recommendations: Left voice mail with ICM number and encouraged to call for fluid symptoms.  Follow-up plan: ICM clinic phone appointment on 11/05/2016.    Copy of ICM check sent to device physician.   3 month ICM trend: 10/05/2016   1 Year ICM trend:      Rosalene Billings, RN 10/05/2016 10:29 AM

## 2016-10-09 DIAGNOSIS — G4733 Obstructive sleep apnea (adult) (pediatric): Secondary | ICD-10-CM | POA: Diagnosis not present

## 2016-10-09 NOTE — Progress Notes (Signed)
Remote ICD transmission.   

## 2016-10-11 ENCOUNTER — Encounter: Payer: Self-pay | Admitting: Cardiology

## 2016-10-11 DIAGNOSIS — H26492 Other secondary cataract, left eye: Secondary | ICD-10-CM | POA: Diagnosis not present

## 2016-10-11 DIAGNOSIS — H26493 Other secondary cataract, bilateral: Secondary | ICD-10-CM | POA: Diagnosis not present

## 2016-10-11 DIAGNOSIS — H1852 Epithelial (juvenile) corneal dystrophy: Secondary | ICD-10-CM | POA: Diagnosis not present

## 2016-10-11 LAB — CUP PACEART REMOTE DEVICE CHECK
Battery Remaining Longevity: 93 mo
Battery Voltage: 3 V
Brady Statistic AP VP Percent: 1.35 %
Brady Statistic AP VS Percent: 0.01 %
Brady Statistic AS VP Percent: 98.09 %
Brady Statistic RA Percent Paced: 1.35 %
Brady Statistic RV Percent Paced: 98.5 %
Date Time Interrogation Session: 20180420143738
HighPow Impedance: 48 Ohm
HighPow Impedance: 65 Ohm
Implantable Lead Implant Date: 20080821
Implantable Lead Implant Date: 20080821
Implantable Lead Implant Date: 20080821
Implantable Lead Location: 753858
Implantable Lead Location: 753859
Implantable Lead Model: 4194
Implantable Lead Model: 6947
Lead Channel Impedance Value: 380 Ohm
Lead Channel Impedance Value: 570 Ohm
Lead Channel Impedance Value: 589 Ohm
Lead Channel Impedance Value: 855 Ohm
Lead Channel Pacing Threshold Amplitude: 0.75 V
Lead Channel Pacing Threshold Pulse Width: 0.4 ms
Lead Channel Sensing Intrinsic Amplitude: 1.5 mV
Lead Channel Sensing Intrinsic Amplitude: 16.5 mV
Lead Channel Setting Pacing Amplitude: 2 V
Lead Channel Setting Pacing Pulse Width: 0.4 ms
Lead Channel Setting Pacing Pulse Width: 0.4 ms
MDC IDC LEAD LOCATION: 753860
MDC IDC MSMT LEADCHNL LV PACING THRESHOLD AMPLITUDE: 0.75 V
MDC IDC MSMT LEADCHNL LV PACING THRESHOLD PULSEWIDTH: 0.4 ms
MDC IDC MSMT LEADCHNL RA IMPEDANCE VALUE: 456 Ohm
MDC IDC MSMT LEADCHNL RA SENSING INTR AMPL: 1.5 mV
MDC IDC MSMT LEADCHNL RV IMPEDANCE VALUE: 513 Ohm
MDC IDC MSMT LEADCHNL RV PACING THRESHOLD AMPLITUDE: 0.5 V
MDC IDC MSMT LEADCHNL RV PACING THRESHOLD PULSEWIDTH: 0.4 ms
MDC IDC MSMT LEADCHNL RV SENSING INTR AMPL: 16.5 mV
MDC IDC PG IMPLANT DT: 20170913
MDC IDC SET LEADCHNL LV PACING AMPLITUDE: 1.25 V
MDC IDC SET LEADCHNL RV PACING AMPLITUDE: 2.5 V
MDC IDC SET LEADCHNL RV SENSING SENSITIVITY: 0.3 mV
MDC IDC STAT BRADY AS VS PERCENT: 0.55 %

## 2016-10-17 DIAGNOSIS — H26491 Other secondary cataract, right eye: Secondary | ICD-10-CM | POA: Diagnosis not present

## 2016-10-24 DIAGNOSIS — Z961 Presence of intraocular lens: Secondary | ICD-10-CM | POA: Diagnosis not present

## 2016-10-24 DIAGNOSIS — Z9849 Cataract extraction status, unspecified eye: Secondary | ICD-10-CM | POA: Diagnosis not present

## 2016-10-24 DIAGNOSIS — H524 Presbyopia: Secondary | ICD-10-CM | POA: Diagnosis not present

## 2016-10-24 DIAGNOSIS — H5202 Hypermetropia, left eye: Secondary | ICD-10-CM | POA: Diagnosis not present

## 2016-10-24 DIAGNOSIS — H52223 Regular astigmatism, bilateral: Secondary | ICD-10-CM | POA: Diagnosis not present

## 2016-11-05 ENCOUNTER — Telehealth: Payer: Self-pay

## 2016-11-05 ENCOUNTER — Ambulatory Visit (INDEPENDENT_AMBULATORY_CARE_PROVIDER_SITE_OTHER): Payer: PPO

## 2016-11-05 DIAGNOSIS — I5022 Chronic systolic (congestive) heart failure: Secondary | ICD-10-CM | POA: Diagnosis not present

## 2016-11-05 DIAGNOSIS — Z95810 Presence of automatic (implantable) cardiac defibrillator: Secondary | ICD-10-CM

## 2016-11-05 NOTE — Progress Notes (Signed)
EPIC Encounter for ICM Monitoring  Patient Name: Bianca [name] is a 83 y.o. female Date: 11/05/2016 Primary Care Physican: Maurice Small, MD Primary Cardiologist:Skains Electrophysiologist: Lovena Le Dry Weight:unknown Bi-V Pacing: 98.0%      Attempted call to patient and unable to reach.  Left detailed message regarding transmission.  Transmission reviewed.    Thoracic impedance normal.  Current prescribed dose of Torsemide 20 mg 4 tablets (80 mg total) every day and take 2 tablets (40 mg total) every Tues, Thurs &Sat. Potassium 20 mEq 2 tablets (40 mg total) bid  Labs: 09/08/2017Creatinine 1.01, BUN 21, Potassium 4.0, Sodium 138  01/01/2016 Creatinine 1.02, BUN 38, Potassium 3.9, Sodium 133  12/31/2015 Creatinine 1.10, BUN 31, Potassium 3.6, Sodium 140  12/30/2015 Creatinine 1.02, BUN 25, Potassium 3.4, Sodium 137  12/29/2015 Creatinine 0.84, BUN 27, Potassium 3.0, Sodium 136  12/28/2015 Creatinine 0.83, BUN 26, Potassium 3.4, Sodium 138  12/27/2015 Creatinine 0.96, BUN 2.6Potassium 3.3, Sodium 137  12/27/2015 Creatinine 0.86, BUN 29, Potassium 4.9, Sodium 134  12/26/2015 Creatinine 1.30, BUN 31, Potassium 3.9, Sodium 134  12/25/2015 Creatinine 1.25, BUN 33, Potassium 3.9, Sodium 134  12/24/2015 Creatinine 1.22, BUN 25, Potassium 3.9, Sodium 136  12/23/2015 Creatinine 1.11, BUN 19, Potassium 4.0, Sodium 138  12/22/2015 Creatinine 1.02, BUN 15, Potassium 3.1, Sodium 139  12/21/2015 Creatinine 1.17, BUN 18, Potassium 3.0, Sodium 138   Recommendations: Left voice mail with ICM number and encouraged to call for fluid symptoms.  Follow-up plan: ICM clinic phone appointment on 12/06/2016.    Copy of ICM check sent to device physician.   3 month ICM trend: 11/05/2016   1 Year ICM trend:      Rosalene Billings, RN 11/05/2016 10:07 AM

## 2016-11-05 NOTE — Telephone Encounter (Signed)
Remote ICM transmission received.  Attempted patient call and left detailed message regarding transmission and next ICM scheduled for 12/06/2016.  Advised to return call for any fluid symptoms or questions.    

## 2016-11-08 DIAGNOSIS — G4733 Obstructive sleep apnea (adult) (pediatric): Secondary | ICD-10-CM | POA: Diagnosis not present

## 2016-12-06 ENCOUNTER — Ambulatory Visit (INDEPENDENT_AMBULATORY_CARE_PROVIDER_SITE_OTHER): Payer: PPO

## 2016-12-06 DIAGNOSIS — I5022 Chronic systolic (congestive) heart failure: Secondary | ICD-10-CM | POA: Diagnosis not present

## 2016-12-06 DIAGNOSIS — Z95810 Presence of automatic (implantable) cardiac defibrillator: Secondary | ICD-10-CM | POA: Diagnosis not present

## 2016-12-07 ENCOUNTER — Telehealth: Payer: Self-pay

## 2016-12-07 NOTE — Telephone Encounter (Signed)
Remote ICM transmission received.  Attempted patient call and left detailed message regarding transmission and next ICM scheduled for 01/08/2017.  Advised to return call for any fluid symptoms or questions.

## 2016-12-07 NOTE — Progress Notes (Signed)
EPIC Encounter for ICM Monitoring  Patient Name: Bianca [name] is a 83 y.o. female Date: 12/07/2016 Primary Care Physican: Maurice Small, MD Primary Cardiologist:Skains Electrophysiologist: Lovena Le Dry Weight:unknown Bi-V Pacing: 98.3%        Attempted call to patient and unable to reach.  Left detailed message regarding transmission.  Transmission reviewed.    Thoracic impedance normal.  Current prescribed dose of Torsemide 20 mg 4 tablets (80 mg total) every day and take 2 tablets (40 mg total) every Tues, Thurs &Sat. Potassium 20 mEq 2 tablets (40 mg total) bid  Labs: 09/08/2017Creatinine 1.01, BUN 21, Potassium 4.0, Sodium 138  01/01/2016 Creatinine 1.02, BUN 38, Potassium 3.9, Sodium 133  12/31/2015 Creatinine 1.10, BUN 31, Potassium 3.6, Sodium 140  12/30/2015 Creatinine 1.02, BUN 25, Potassium 3.4, Sodium 137  12/29/2015 Creatinine 0.84, BUN 27, Potassium 3.0, Sodium 136  12/28/2015 Creatinine 0.83, BUN 26, Potassium 3.4, Sodium 138  12/27/2015 Creatinine 0.96, BUN 2.6Potassium 3.3, Sodium 137  12/27/2015 Creatinine 0.86, BUN 29, Potassium 4.9, Sodium 134  12/26/2015 Creatinine 1.30, BUN 31, Potassium 3.9, Sodium 134  12/25/2015 Creatinine 1.25, BUN 33, Potassium 3.9, Sodium 134  12/24/2015 Creatinine 1.22, BUN 25, Potassium 3.9, Sodium 136  12/23/2015 Creatinine 1.11, BUN 19, Potassium 4.0, Sodium 138  12/22/2015 Creatinine 1.02, BUN 15, Potassium 3.1, Sodium 139  12/21/2015 Creatinine 1.17, BUN 18, Potassium 3.0, Sodium 138   Recommendations: Left voice mail with ICM number and encouraged to call for fluid symptoms.  Follow-up plan: ICM clinic phone appointment on 01/08/2017.  Office appointment scheduled with Angelena Form, PA on 12/27/2016.  Copy of ICM check sent to device physician.   3 month ICM trend: 12/06/2016   1 Year ICM trend:      Rosalene Billings, RN 12/07/2016 9:36 AM

## 2016-12-09 DIAGNOSIS — G4733 Obstructive sleep apnea (adult) (pediatric): Secondary | ICD-10-CM | POA: Diagnosis not present

## 2016-12-17 DIAGNOSIS — R05 Cough: Secondary | ICD-10-CM | POA: Diagnosis not present

## 2016-12-20 ENCOUNTER — Encounter (HOSPITAL_COMMUNITY): Payer: Self-pay | Admitting: Nurse Practitioner

## 2016-12-20 ENCOUNTER — Emergency Department (HOSPITAL_COMMUNITY)
Admission: EM | Admit: 2016-12-20 | Discharge: 2016-12-20 | Disposition: A | Discharge status: Home / Self Care | Payer: PPO | Attending: Emergency Medicine | Admitting: Emergency Medicine

## 2016-12-20 DIAGNOSIS — Y69 Unspecified misadventure during surgical and medical care: Secondary | ICD-10-CM | POA: Insufficient documentation

## 2016-12-20 DIAGNOSIS — I252 Old myocardial infarction: Secondary | ICD-10-CM | POA: Diagnosis not present

## 2016-12-20 DIAGNOSIS — I5022 Chronic systolic (congestive) heart failure: Secondary | ICD-10-CM | POA: Insufficient documentation

## 2016-12-20 DIAGNOSIS — N183 Chronic kidney disease, stage 3 (moderate): Secondary | ICD-10-CM | POA: Diagnosis not present

## 2016-12-20 DIAGNOSIS — E785 Hyperlipidemia, unspecified: Secondary | ICD-10-CM | POA: Diagnosis not present

## 2016-12-20 DIAGNOSIS — K1329 Other disturbances of oral epithelium, including tongue: Secondary | ICD-10-CM | POA: Diagnosis not present

## 2016-12-20 DIAGNOSIS — T887XXA Unspecified adverse effect of drug or medicament, initial encounter: Secondary | ICD-10-CM | POA: Diagnosis not present

## 2016-12-20 DIAGNOSIS — I110 Hypertensive heart disease with heart failure: Secondary | ICD-10-CM | POA: Insufficient documentation

## 2016-12-20 DIAGNOSIS — I129 Hypertensive chronic kidney disease with stage 1 through stage 4 chronic kidney disease, or unspecified chronic kidney disease: Secondary | ICD-10-CM | POA: Insufficient documentation

## 2016-12-20 DIAGNOSIS — I480 Paroxysmal atrial fibrillation: Secondary | ICD-10-CM | POA: Diagnosis not present

## 2016-12-20 DIAGNOSIS — Z87891 Personal history of nicotine dependence: Secondary | ICD-10-CM | POA: Diagnosis not present

## 2016-12-20 DIAGNOSIS — T50905A Adverse effect of unspecified drugs, medicaments and biological substances, initial encounter: Secondary | ICD-10-CM

## 2016-12-20 DIAGNOSIS — T483X5A Adverse effect of antitussives, initial encounter: Secondary | ICD-10-CM | POA: Diagnosis not present

## 2016-12-20 NOTE — Discharge Instructions (Signed)
Please return to the emergency department for difficulty with breathing, wheezing and an itchy rash or uncontrolled nausea and vomiting.

## 2016-12-20 NOTE — ED Provider Notes (Signed)
[facility] DEPT Provider Note   CSN: [id number] Arrival date & time: 12/20/16  2993     History   Chief Complaint Chief Complaint  Patient presents with  . Allergic Reaction    HPI Bianca [name] is a 83 y.o. female.  83 yo F with a chief complaints of tongue numbness. This occurred after she inadvertently put an open Tessalon Perles in her mouth. Had some numbness to her tongue and lips. Denies any swelling. Denies difficulty with swallowing or breathing. Denies rash. Denies vomiting diarrhea. Denies syncopal feeling.   The history is provided by the patient.  Allergic Reaction  Presenting symptoms: no wheezing   Illness  This is a new problem. The current episode started 2 days ago. The problem occurs constantly. The problem has not changed since onset.Pertinent negatives include no chest pain, no headaches and no shortness of breath. Nothing aggravates the symptoms. Nothing relieves the symptoms. She has tried nothing for the symptoms. The treatment provided no relief.    Past Medical History:  Diagnosis Date  . Acute blood loss anemia   . Arthritis   . Chronic systolic heart failure ([place])   . CKD (chronic kidney disease), stage III 12/22/2015  . Depression 11/24/2015  . Hypertension   . Hypokalemia   . Myocardial infarction ([place]) 2008  . Neuromuscular disorder ([place])   . Paroxysmal atrial fibrillation (H[facility])   . Physical deconditioning   . Right flank hematoma   . Sleep apnea     Patient Active Problem List   Diagnosis Date Noted  . Acute on chronic systolic CHF (congestive heart failure) ([place])   . Dyspnea   . Ventricular tachycardia ([place])   . Subclinical hypothyroidism 12/25/2015  . Acute on chronic diastolic congestive heart failure ([place])   . CKD (chronic kidney disease), stage III 12/22/2015  . Hypoxia 12/22/2015  . Hypoxemia 12/21/2015  . CHF exacerbation ([place]) 11/24/2015  . Physical deconditioning 11/24/2015  . Hyperglycemia 11/24/2015  . Peripheral  neuropathy 11/24/2015  . HLD (hyperlipidemia) 11/24/2015  . Depression 11/24/2015  . Chest pain 11/24/2015  . Neuropathy involving both lower extremities   . Paroxysmal atrial fibrillation (HCC)   . OSA (obstructive sleep apnea)   . E. coli UTI   . Subcutaneous hematoma   . Fall 10/25/2015  . Afib ([place]) 01/19/2015  . Long-term (current) use of anticoagulants 07/20/2014  . Persistent atrial fibrillation ([place]) 07/20/2014  . Encounter for therapeutic drug monitoring 07/16/2013  . Chronic systolic heart failure ([place]) 07/16/2013  . History of implantable cardioverter-defibrillator (ICD) placement 07/16/2013  . Morbid obesity ([place]) 07/16/2013  . Chronic anticoagulation 07/16/2013  . Knee osteoarthritis 07/16/2013  . HYPOKALEMIA 08/09/2010  . Neuropathy ([place]) 08/09/2010  . Chronic diastolic (congestive) heart failure ([place]) 08/09/2010  . Automatic implantable cardioverter-defibrillator in situ 08/09/2010    Past Surgical History:  Procedure Laterality Date  . ABDOMINAL HYSTERECTOMY    . ANKLE SURGERY    . CARDIAC CATHETERIZATION    . CHOLECYSTECTOMY    . EP IMPLANTABLE DEVICE N/A 02/29/2016   Procedure: BIV ICD Generator Changeout;  Surgeon: Evans Lance, MD;  Location: [facility];  Service: Cardiovascular;  Laterality: N/A;  . EYE SURGERY    . INSERT / REPLACE / REMOVE PACEMAKER  2008  . KNEE LIGAMENT RECONSTRUCTION  2012  . TONSILLECTOMY      OB History    No data available       Home Medications    Prior to Admission medications  Medication Sig Start Date End Date Taking? Authorizing Provider  acetaminophen (TYLENOL) 325 MG tablet Take 650 mg by mouth 2 (two) times daily as needed for mild pain or headache.    Yes [provider]  allopurinol (ZYLOPRIM) 100 MG tablet Take 100 mg by mouth See admin instructions. Takes along with a 300 mg tablet to equal 400 mg    Yes [provider]  allopurinol (ZYLOPRIM) 300 MG tablet Take 300 mg by mouth See  admin instructions. Take with the 100mg  tablet to equal 400mg  daily    Yes [provider]  apixaban (ELIQUIS) 5 MG TABS tablet Take 1 tablet (5 mg total) by mouth 2 (two) times daily. Needs office visit Patient taking differently: Take 5 mg by mouth 2 (two) times daily.  09/26/16  Yes Larey Dresser, MD  atorvastatin (LIPITOR) 10 MG tablet Take 1 tablet (10 mg total) by mouth daily at 6 PM. 07/04/16  Yes Clegg, Amy D, NP  benzonatate (TESSALON) 200 MG capsule Take 200 mg by mouth 3 (three) times daily as needed for cough. 12/17/16  Yes [provider]  bisoprolol (ZEBETA) 5 MG tablet Take 0.5 tablets (2.5 mg total) by mouth at bedtime. 07/04/16  Yes Clegg, Amy D, NP  Calcium Carbonate-Vit D-Min (CALCIUM/VITAMIN D/MINERALS) 600-200 MG-UNIT TABS Take 1 tablet by mouth daily.   Yes [provider]  Cholecalciferol (VITAMIN D3) 1000 UNITS CAPS Take 1 capsule by mouth daily.   Yes [provider]  colestipol (COLESTID) 1 G tablet Take 2 g by mouth at bedtime.    Yes [provider]  gabapentin (NEURONTIN) 800 MG tablet Take 800 mg by mouth 3 (three) times daily.   Yes [provider]  guaiFENesin (MUCINEX) 600 MG 12 hr tablet Take 1 tablet (600 mg total) by mouth 2 (two) times daily. 11/29/15  Yes Theodis Blaze, MD  guaiFENesin-codeine 100-10 MG/5ML syrup Take 5 mLs by mouth at bedtime as needed for cough. 12/17/16  Yes [provider]  nortriptyline (PAMELOR) 25 MG capsule Take 25 mg by mouth at bedtime.   Yes [provider]  OXYGEN Inhale 2 L/min into the lungs at bedtime as needed (uses as bedtime everynight and then occasionally iun the daytime as needed for SOB).    Yes [provider]  potassium chloride SA (K-DUR,KLOR-CON) 20 MEQ tablet Take 2 tablets (40 mEq total) by mouth 2 (two) times daily. 07/04/16  Yes Clegg, Amy D, NP  spironolactone (ALDACTONE) 25 MG tablet Take 0.5 tablets (12.5 mg total) by mouth daily. 07/04/16   Yes Clegg, Amy D, NP  torsemide (DEMADEX) 20 MG tablet Take 4 tablets (80 mg total) by mouth daily. Take additional 40 mg (2 tablets) in the afternoon on Tuesday, Thursday and Saturday 07/04/16  Yes Clegg, Amy D, NP  nitroGLYCERIN (NITROSTAT) 0.4 MG SL tablet Place 1 tablet (0.4 mg total) under the tongue every 5 (five) minutes as needed for chest pain. 11/24/12   Jettie Booze, MD  senna-docusate (SENOKOT-S) 8.6-50 MG tablet Take 1 tablet by mouth daily as needed for mild constipation.    [provider]    Family History Family History  Problem Relation Age of Onset  . Stroke Mother   . Congestive Heart Failure Mother   . Tuberculosis Father   . Diabetes Brother   . Stroke Brother   . Cancer Brother   . Heart attack Son     Social History Social History  Substance Use  Topics  . Smoking status: Former Research scientist (life sciences)  . Smokeless tobacco: Former Systems developer  . Alcohol use No     Allergies   Quinapril hcl; Tape; Tessalon [benzonatate]; and Neosporin [neomycin-polymyxin-gramicidin]   Review of Systems Review of Systems  Constitutional: Negative for chills and fever.  HENT: Positive for facial swelling. Negative for congestion and rhinorrhea.   Eyes: Negative for redness and visual disturbance.  Respiratory: Negative for shortness of breath and wheezing.   Cardiovascular: Negative for chest pain and palpitations.  Gastrointestinal: Negative for nausea and vomiting.  Genitourinary: Negative for dysuria and urgency.  Musculoskeletal: Negative for arthralgias and myalgias.  Skin: Negative for pallor and wound.  Neurological: Negative for dizziness and headaches.     Physical Exam Updated Vital Signs BP 106/65 (BP Location: Left Arm)   Pulse 78   Temp 97.9 F (36.6 C) (Oral)   Resp 18   Ht 5\' 6"  (1.676 m)   Wt 102.5 kg (226 lb)   LMP  (LMP Unknown)   SpO2 92%   BMI 36.48 kg/m   Physical Exam  Constitutional: She is oriented to person, place, and time. She appears  well-developed and well-nourished. No distress.  HENT:  Head: Normocephalic and atraumatic.  Eyes: EOM are normal. Pupils are equal, round, and reactive to light.  Neck: Normal range of motion. Neck supple.  Cardiovascular: Normal rate and regular rhythm.  Exam reveals no gallop and no friction rub.   No murmur heard. Pulmonary/Chest: Effort normal. She has no wheezes. She has no rales.  Abdominal: Soft. She exhibits no distension. There is no tenderness. There is no guarding.  Musculoskeletal: She exhibits no edema or tenderness.  Neurological: She is alert and oriented to person, place, and time.  Skin: Skin is warm and dry. She is not diaphoretic.  Psychiatric: She has a normal mood and affect. Her behavior is normal.  Nursing note and vitals reviewed.    ED Treatments / Results  Labs (all labs ordered are listed, but only abnormal results are displayed) Labs Reviewed - No data to display  EKG  EKG Interpretation None       Radiology No results found.  Procedures Procedures (including critical care time)  Medications Ordered in ED Medications - No data to display   Initial Impression / Assessment and Plan / ED Course  I have reviewed the triage vital signs and the nursing notes.  Pertinent labs & imaging results that were available during my care of the patient were reviewed by me and considered in my medical decision making (see chart for details).     82 yo F With a chief complaint of numbness to her tongue. This occurred after she applied benzoin to it. I think this is the normal reaction for that. No other signs of anaphylaxis. No swelling. Will discharge home. PCP follow-up.  10:17 AM:  I have discussed the diagnosis/risks/treatment options with the patient and family and believe the pt to be eligible for discharge home to follow-up with PCP. We also discussed returning to the ED immediately if new or worsening sx occur. We discussed the sx which are most  concerning (e.g., shortness of breath, wheezing, vomiting, diarrhea, rash) that necessitate immediate return. Medications administered to the patient during their visit and any new prescriptions provided to the patient are listed below.  Medications given during this visit Medications - No data to display   The patient appears reasonably screen and/or stabilized for discharge and I doubt any other medical condition  or other [facility] requiring further screening, evaluation, or treatment in the ED at this time prior to discharge.    Final Clinical Impressions(s) / ED Diagnoses   Final diagnoses:  Adverse effect of drug, initial encounter    New Prescriptions New Prescriptions   No medications on file     Deno Etienne, DO 12/20/16 1018

## 2016-12-20 NOTE — ED Triage Notes (Signed)
Pt states has been taking benzonatate tabs for cough for the past few days. This morning around 7:30 when she took capsule it opened in her mouth and she noted acute onset of tongue swelling, patient felt mouth tingling, felt very hot and diaphoretic. Patient denies difficulty swallowing, shob, chest pain, rash, dizziness, lightheadedness. Patient able to speak full sentences, does not appear in acute distress.

## 2016-12-20 NOTE — ED Notes (Signed)
Reports initial tongue swelling, has subsided

## 2016-12-20 NOTE — ED Notes (Signed)
Pt assisted to the bathroom with this rn, remained at patients side, assisted back into bed

## 2016-12-27 ENCOUNTER — Ambulatory Visit: Payer: PPO | Admitting: Physician Assistant

## 2016-12-28 ENCOUNTER — Other Ambulatory Visit (HOSPITAL_COMMUNITY): Payer: Self-pay | Admitting: Cardiology

## 2016-12-31 DIAGNOSIS — R5382 Chronic fatigue, unspecified: Secondary | ICD-10-CM | POA: Diagnosis not present

## 2016-12-31 DIAGNOSIS — I5022 Chronic systolic (congestive) heart failure: Secondary | ICD-10-CM | POA: Diagnosis not present

## 2016-12-31 DIAGNOSIS — E611 Iron deficiency: Secondary | ICD-10-CM | POA: Diagnosis not present

## 2017-01-01 ENCOUNTER — Inpatient Hospital Stay (HOSPITAL_COMMUNITY)
Admission: EM | Admit: 2017-01-01 | Discharge: 2017-01-04 | DRG: 291 | Disposition: A | Discharge status: Home / Self Care | Payer: PPO | Attending: Nephrology | Admitting: Nephrology

## 2017-01-01 ENCOUNTER — Encounter (HOSPITAL_COMMUNITY): Payer: Self-pay

## 2017-01-01 ENCOUNTER — Emergency Department (HOSPITAL_COMMUNITY): Payer: PPO

## 2017-01-01 DIAGNOSIS — I5023 Acute on chronic systolic (congestive) heart failure: Secondary | ICD-10-CM | POA: Diagnosis not present

## 2017-01-01 DIAGNOSIS — E785 Hyperlipidemia, unspecified: Secondary | ICD-10-CM | POA: Diagnosis present

## 2017-01-01 DIAGNOSIS — Z888 Allergy status to other drugs, medicaments and biological substances status: Secondary | ICD-10-CM

## 2017-01-01 DIAGNOSIS — Z883 Allergy status to other anti-infective agents status: Secondary | ICD-10-CM | POA: Diagnosis not present

## 2017-01-01 DIAGNOSIS — G4733 Obstructive sleep apnea (adult) (pediatric): Secondary | ICD-10-CM | POA: Diagnosis present

## 2017-01-01 DIAGNOSIS — I2510 Atherosclerotic heart disease of native coronary artery without angina pectoris: Secondary | ICD-10-CM | POA: Diagnosis present

## 2017-01-01 DIAGNOSIS — Z7901 Long term (current) use of anticoagulants: Secondary | ICD-10-CM

## 2017-01-01 DIAGNOSIS — Z6836 Body mass index (BMI) 36.0-36.9, adult: Secondary | ICD-10-CM | POA: Diagnosis not present

## 2017-01-01 DIAGNOSIS — I429 Cardiomyopathy, unspecified: Secondary | ICD-10-CM | POA: Diagnosis present

## 2017-01-01 DIAGNOSIS — I130 Hypertensive heart and chronic kidney disease with heart failure and stage 1 through stage 4 chronic kidney disease, or unspecified chronic kidney disease: Principal | ICD-10-CM | POA: Diagnosis present

## 2017-01-01 DIAGNOSIS — I272 Pulmonary hypertension, unspecified: Secondary | ICD-10-CM | POA: Diagnosis present

## 2017-01-01 DIAGNOSIS — K5900 Constipation, unspecified: Secondary | ICD-10-CM | POA: Diagnosis present

## 2017-01-01 DIAGNOSIS — Z8679 Personal history of other diseases of the circulatory system: Secondary | ICD-10-CM

## 2017-01-01 DIAGNOSIS — I5031 Acute diastolic (congestive) heart failure: Secondary | ICD-10-CM | POA: Diagnosis not present

## 2017-01-01 DIAGNOSIS — N183 Chronic kidney disease, stage 3 unspecified: Secondary | ICD-10-CM | POA: Diagnosis present

## 2017-01-01 DIAGNOSIS — Z95810 Presence of automatic (implantable) cardiac defibrillator: Secondary | ICD-10-CM | POA: Diagnosis not present

## 2017-01-01 DIAGNOSIS — Z9981 Dependence on supplemental oxygen: Secondary | ICD-10-CM

## 2017-01-01 DIAGNOSIS — H9190 Unspecified hearing loss, unspecified ear: Secondary | ICD-10-CM | POA: Diagnosis present

## 2017-01-01 DIAGNOSIS — Z8249 Family history of ischemic heart disease and other diseases of the circulatory system: Secondary | ICD-10-CM | POA: Diagnosis not present

## 2017-01-01 DIAGNOSIS — Z823 Family history of stroke: Secondary | ICD-10-CM

## 2017-01-01 DIAGNOSIS — E669 Obesity, unspecified: Secondary | ICD-10-CM | POA: Diagnosis present

## 2017-01-01 DIAGNOSIS — G709 Myoneural disorder, unspecified: Secondary | ICD-10-CM | POA: Diagnosis present

## 2017-01-01 DIAGNOSIS — I5043 Acute on chronic combined systolic (congestive) and diastolic (congestive) heart failure: Secondary | ICD-10-CM | POA: Diagnosis present

## 2017-01-01 DIAGNOSIS — R0602 Shortness of breath: Secondary | ICD-10-CM | POA: Diagnosis not present

## 2017-01-01 DIAGNOSIS — R05 Cough: Secondary | ICD-10-CM | POA: Diagnosis not present

## 2017-01-01 DIAGNOSIS — G4730 Sleep apnea, unspecified: Secondary | ICD-10-CM | POA: Diagnosis present

## 2017-01-01 DIAGNOSIS — I10 Essential (primary) hypertension: Secondary | ICD-10-CM | POA: Diagnosis present

## 2017-01-01 DIAGNOSIS — I5033 Acute on chronic diastolic (congestive) heart failure: Secondary | ICD-10-CM | POA: Diagnosis not present

## 2017-01-01 DIAGNOSIS — Z87891 Personal history of nicotine dependence: Secondary | ICD-10-CM | POA: Diagnosis not present

## 2017-01-01 DIAGNOSIS — I252 Old myocardial infarction: Secondary | ICD-10-CM | POA: Diagnosis not present

## 2017-01-01 DIAGNOSIS — I480 Paroxysmal atrial fibrillation: Secondary | ICD-10-CM | POA: Diagnosis present

## 2017-01-01 DIAGNOSIS — I361 Nonrheumatic tricuspid (valve) insufficiency: Secondary | ICD-10-CM | POA: Diagnosis not present

## 2017-01-01 DIAGNOSIS — I5041 Acute combined systolic (congestive) and diastolic (congestive) heart failure: Secondary | ICD-10-CM | POA: Diagnosis not present

## 2017-01-01 DIAGNOSIS — R069 Unspecified abnormalities of breathing: Secondary | ICD-10-CM | POA: Diagnosis not present

## 2017-01-01 DIAGNOSIS — Z9989 Dependence on other enabling machines and devices: Secondary | ICD-10-CM | POA: Diagnosis not present

## 2017-01-01 DIAGNOSIS — I447 Left bundle-branch block, unspecified: Secondary | ICD-10-CM | POA: Diagnosis present

## 2017-01-01 DIAGNOSIS — Z79899 Other long term (current) drug therapy: Secondary | ICD-10-CM

## 2017-01-01 DIAGNOSIS — G629 Polyneuropathy, unspecified: Secondary | ICD-10-CM | POA: Diagnosis present

## 2017-01-01 DIAGNOSIS — Z90710 Acquired absence of both cervix and uterus: Secondary | ICD-10-CM

## 2017-01-01 HISTORY — DX: Atherosclerotic heart disease of native coronary artery without angina pectoris: I25.10

## 2017-01-01 LAB — COMPREHENSIVE METABOLIC PANEL
ALT: 14 U/L (ref 14–54)
ANION GAP: 9 (ref 5–15)
AST: 20 U/L (ref 15–41)
Albumin: 4.1 g/dL (ref 3.5–5.0)
Alkaline Phosphatase: 69 U/L (ref 38–126)
BILIRUBIN TOTAL: 1 mg/dL (ref 0.3–1.2)
BUN: 17 mg/dL (ref 6–20)
CO2: 27 mmol/L (ref 22–32)
Calcium: 9.4 mg/dL (ref 8.9–10.3)
Chloride: 104 mmol/L (ref 101–111)
Creatinine, Ser: 1.1 mg/dL — ABNORMAL HIGH (ref 0.44–1.00)
GFR calc Af Amer: 53 mL/min — ABNORMAL LOW (ref >60)
GFR, EST NON AFRICAN AMERICAN: 45 mL/min — AB (ref >60)
Glucose, Bld: 109 mg/dL — ABNORMAL HIGH (ref 65–99)
POTASSIUM: 4.1 mmol/L (ref 3.5–5.1)
Sodium: 140 mmol/L (ref 135–145)
TOTAL PROTEIN: 7 g/dL (ref 6.5–8.1)

## 2017-01-01 LAB — I-STAT TROPONIN, ED: TROPONIN I, POC: 0.03 ng/mL (ref 0.00–0.08)

## 2017-01-01 LAB — CBC WITH DIFFERENTIAL/PLATELET
BASOS PCT: 0 %
Basophils Absolute: 0 10*3/uL (ref 0.0–0.1)
EOS ABS: 0.3 10*3/uL (ref 0.0–0.7)
Eosinophils Relative: 3 %
HCT: 38.5 % (ref 36.0–46.0)
HEMOGLOBIN: 12.4 g/dL (ref 12.0–15.0)
LYMPHS ABS: 2.9 10*3/uL (ref 0.7–4.0)
Lymphocytes Relative: 33 %
MCH: 31.1 pg (ref 26.0–34.0)
MCHC: 32.2 g/dL (ref 30.0–36.0)
MCV: 96.5 fL (ref 78.0–100.0)
MONOS PCT: 8 %
Monocytes Absolute: 0.7 10*3/uL (ref 0.1–1.0)
NEUTROS PCT: 56 %
Neutro Abs: 4.9 10*3/uL (ref 1.7–7.7)
Platelets: 190 10*3/uL (ref 150–400)
RBC: 3.99 MIL/uL (ref 3.87–5.11)
RDW: 15.1 % (ref 11.5–15.5)
WBC: 8.8 10*3/uL (ref 4.0–10.5)

## 2017-01-01 LAB — PROTIME-INR
INR: 1.47
PROTHROMBIN TIME: 18 s — AB (ref 11.4–15.2)

## 2017-01-01 LAB — BRAIN NATRIURETIC PEPTIDE: B NATRIURETIC PEPTIDE 5: 975.5 pg/mL — AB (ref 0.0–100.0)

## 2017-01-01 MED ORDER — FUROSEMIDE 10 MG/ML IJ SOLN
60.0000 mg | Freq: Once | INTRAMUSCULAR | Status: AC
  Administered 2017-01-01: 60 mg via INTRAVENOUS
  Filled 2017-01-01: qty 6

## 2017-01-01 MED ORDER — COLESTIPOL HCL 1 G PO TABS
2.0000 g | ORAL_TABLET | Freq: Every day | ORAL | Status: DC
  Administered 2017-01-01 – 2017-01-03 (×3): 2 g via ORAL
  Filled 2017-01-01 (×3): qty 2

## 2017-01-01 MED ORDER — SODIUM CHLORIDE 0.9% FLUSH: 3.0000 mL | INTRAVENOUS | Status: DC | PRN

## 2017-01-01 MED ORDER — ACETAMINOPHEN 325 MG PO TABS
650.0000 mg | ORAL_TABLET | ORAL | Status: DC | PRN
  Administered 2017-01-02 – 2017-01-04 (×3): 650 mg via ORAL
  Filled 2017-01-01 (×3): qty 2

## 2017-01-01 MED ORDER — ONDANSETRON HCL 4 MG/2ML IJ SOLN: 4.0000 mg | Freq: Four times a day (QID) | INTRAMUSCULAR | Status: DC | PRN

## 2017-01-01 MED ORDER — CALCIUM CARBONATE-VITAMIN D 500-200 MG-UNIT PO TABS
1.0000 | ORAL_TABLET | Freq: Every day | ORAL | Status: DC
  Administered 2017-01-02 – 2017-01-04 (×3): 1 via ORAL
  Filled 2017-01-01 (×3): qty 1

## 2017-01-01 MED ORDER — CALCIUM/VITAMIN D/MINERALS 600-200 MG-UNIT PO TABS: 1.0000 | ORAL_TABLET | Freq: Every day | ORAL | Status: DC

## 2017-01-01 MED ORDER — ALLOPURINOL 100 MG PO TABS
400.0000 mg | ORAL_TABLET | Freq: Every day | ORAL | Status: DC
  Administered 2017-01-02 – 2017-01-04 (×3): 400 mg via ORAL
  Filled 2017-01-01 (×3): qty 4

## 2017-01-01 MED ORDER — FUROSEMIDE 10 MG/ML IJ SOLN
80.0000 mg | Freq: Once | INTRAMUSCULAR | Status: AC
  Administered 2017-01-01: 80 mg via INTRAVENOUS
  Filled 2017-01-01: qty 8

## 2017-01-01 MED ORDER — APIXABAN 5 MG PO TABS
5.0000 mg | ORAL_TABLET | Freq: Two times a day (BID) | ORAL | Status: DC
  Administered 2017-01-01 – 2017-01-04 (×6): 5 mg via ORAL
  Filled 2017-01-01 (×6): qty 1

## 2017-01-01 MED ORDER — BISOPROLOL FUMARATE 5 MG PO TABS
2.5000 mg | ORAL_TABLET | Freq: Every day | ORAL | Status: DC
  Administered 2017-01-01 – 2017-01-03 (×3): 2.5 mg via ORAL
  Filled 2017-01-01 (×3): qty 1

## 2017-01-01 MED ORDER — VITAMIN D 1000 UNITS PO TABS
1000.0000 [IU] | ORAL_TABLET | Freq: Every day | ORAL | Status: DC
  Administered 2017-01-02 – 2017-01-04 (×3): 1000 [IU] via ORAL
  Filled 2017-01-01 (×3): qty 1

## 2017-01-01 MED ORDER — SENNOSIDES-DOCUSATE SODIUM 8.6-50 MG PO TABS
1.0000 | ORAL_TABLET | Freq: Every day | ORAL | Status: DC | PRN
  Filled 2017-01-01: qty 1

## 2017-01-01 MED ORDER — POTASSIUM CHLORIDE CRYS ER 20 MEQ PO TBCR
40.0000 meq | EXTENDED_RELEASE_TABLET | Freq: Two times a day (BID) | ORAL | Status: DC
  Administered 2017-01-01 – 2017-01-04 (×6): 40 meq via ORAL
  Filled 2017-01-01 (×6): qty 2

## 2017-01-01 MED ORDER — ATORVASTATIN CALCIUM 10 MG PO TABS
10.0000 mg | ORAL_TABLET | Freq: Every day | ORAL | Status: DC
  Administered 2017-01-02 – 2017-01-03 (×2): 10 mg via ORAL
  Filled 2017-01-01 (×3): qty 1

## 2017-01-01 MED ORDER — SODIUM CHLORIDE 0.9% FLUSH
3.0000 mL | Freq: Two times a day (BID) | INTRAVENOUS | Status: DC
  Administered 2017-01-01 – 2017-01-03 (×5): 3 mL via INTRAVENOUS

## 2017-01-01 MED ORDER — SODIUM CHLORIDE 0.9 % IV SOLN: 250.0000 mL | INTRAVENOUS | Status: DC | PRN

## 2017-01-01 NOTE — ED Triage Notes (Addendum)
Pt arrives EMS from home with c/o increasing SHOB over last 2 days with [facility] now while at rest. Sat improved with 2l/[place]. Family states she was seen by PMD yesterday with blood work of BNP 1500

## 2017-01-01 NOTE — Progress Notes (Signed)
On arrival patient is sleeping comfortably on her home unit CPAP machine. No distress or complications noted

## 2017-01-01 NOTE — ED Provider Notes (Signed)
[facility] DEPT Provider Note   CSN: [id number] Arrival date & time: 01/01/17  1308     History   Chief Complaint Chief Complaint  Patient presents with  . Shortness of Breath    HPI Bianca [name] is a 83 y.o. female.  Pt reports shortness of breath beginning this morning.  She denies associated chest pain.  Pt denies recent weight change but does endorse more swelling in feet and ankles recently.  Pt reports being more sleepy than usual and per daughter her pcp has been changing her neuropathy medications, increased her nortriptyline dosage and lowered her gabapentin.  Yesterday pt was told to switch back to old dosages as she was having increased sleepiness.  Additionally, she reports having an increased BNP to 1500 that was taken yesterday.  Of note she is on cpap with 2L O2 at night that she uses without issue and is not on oxygen during the day at home.        Past Medical History:  Diagnosis Date  . Acute blood loss anemia   . Arthritis   . Chronic systolic heart failure ([place])   . CKD (chronic kidney disease), stage III 12/22/2015  . Depression 11/24/2015  . Hypertension   . Hypokalemia   . Myocardial infarction ([place]) 2008  . Neuromuscular disorder ([place])   . Paroxysmal atrial fibrillation (H[facility])   . Physical deconditioning   . Right flank hematoma   . Sleep apnea     Patient Active Problem List   Diagnosis Date Noted  . Acute on chronic systolic CHF (congestive heart failure) ([place])   . Dyspnea   . Ventricular tachycardia ([place])   . Subclinical hypothyroidism 12/25/2015  . Acute on chronic diastolic congestive heart failure ([place])   . CKD (chronic kidney disease), stage III 12/22/2015  . Hypoxia 12/22/2015  . Hypoxemia 12/21/2015  . CHF exacerbation ([place]) 11/24/2015  . Physical deconditioning 11/24/2015  . Hyperglycemia 11/24/2015  . Peripheral neuropathy 11/24/2015  . HLD (hyperlipidemia) 11/24/2015  . Depression 11/24/2015  . Chest pain 11/24/2015  .  Neuropathy involving both lower extremities   . Paroxysmal atrial fibrillation (HCC)   . OSA (obstructive sleep apnea)   . E. coli UTI   . Subcutaneous hematoma   . Fall 10/25/2015  . Afib ([place]) 01/19/2015  . Long-term (current) use of anticoagulants 07/20/2014  . Persistent atrial fibrillation ([place]) 07/20/2014  . Encounter for therapeutic drug monitoring 07/16/2013  . Chronic systolic heart failure ([place]) 07/16/2013  . History of implantable cardioverter-defibrillator (ICD) placement 07/16/2013  . Morbid obesity ([place]) 07/16/2013  . Chronic anticoagulation 07/16/2013  . Knee osteoarthritis 07/16/2013  . HYPOKALEMIA 08/09/2010  . Neuropathy ([place]) 08/09/2010  . Chronic diastolic (congestive) heart failure ([place]) 08/09/2010  . Automatic implantable cardioverter-defibrillator in situ 08/09/2010    Past Surgical History:  Procedure Laterality Date  . ABDOMINAL HYSTERECTOMY    . ANKLE SURGERY    . CARDIAC CATHETERIZATION    . CHOLECYSTECTOMY    . EP IMPLANTABLE DEVICE N/A 02/29/2016   Procedure: BIV ICD Generator Changeout;  Surgeon: Evans Lance, MD;  Location: [facility];  Service: Cardiovascular;  Laterality: N/A;  . EYE SURGERY    . INSERT / REPLACE / REMOVE PACEMAKER  2008  . KNEE LIGAMENT RECONSTRUCTION  2012  . TONSILLECTOMY      OB History    No data available       Home Medications    Prior to Admission medications   Medication Sig  Start Date End Date Taking? Authorizing Provider  acetaminophen (TYLENOL) 325 MG tablet Take 650 mg by mouth 2 (two) times daily as needed for mild pain or headache.     [provider]  allopurinol (ZYLOPRIM) 100 MG tablet Take 100 mg by mouth See admin instructions. Takes along with a 300 mg tablet to equal 400 mg     [provider]  allopurinol (ZYLOPRIM) 300 MG tablet Take 300 mg by mouth See admin instructions. Take with the 100mg  tablet to equal 400mg  daily     [provider]  atorvastatin  (LIPITOR) 10 MG tablet Take 1 tablet (10 mg total) by mouth daily at 6 PM. 07/04/16   Clegg, Amy D, NP  benzonatate (TESSALON) 200 MG capsule Take 200 mg by mouth 3 (three) times daily as needed for cough. 12/17/16   [provider]  bisoprolol (ZEBETA) 5 MG tablet Take 0.5 tablets (2.5 mg total) by mouth at bedtime. 07/04/16   Clegg, Amy D, NP  Calcium Carbonate-Vit D-Min (CALCIUM/VITAMIN D/MINERALS) 600-200 MG-UNIT TABS Take 1 tablet by mouth daily.    [provider]  Cholecalciferol (VITAMIN D3) 1000 UNITS CAPS Take 1 capsule by mouth daily.    [provider]  colestipol (COLESTID) 1 G tablet Take 2 g by mouth at bedtime.     [provider]  ELIQUIS 5 MG TABS tablet TAKE 1 TABLET TWICE DAILY 12/28/16   Larey Dresser, MD  gabapentin (NEURONTIN) 800 MG tablet Take 800 mg by mouth 3 (three) times daily.    [provider]  guaiFENesin (MUCINEX) 600 MG 12 hr tablet Take 1 tablet (600 mg total) by mouth 2 (two) times daily. 11/29/15   Theodis Blaze, MD  guaiFENesin-codeine 100-10 MG/5ML syrup Take 5 mLs by mouth at bedtime as needed for cough. 12/17/16   [provider]  nitroGLYCERIN (NITROSTAT) 0.4 MG SL tablet Place 1 tablet (0.4 mg total) under the tongue every 5 (five) minutes as needed for chest pain. 11/24/12   Jettie Booze, MD  nortriptyline (PAMELOR) 25 MG capsule Take 25 mg by mouth at bedtime.    [provider]  OXYGEN Inhale 2 L/min into the lungs at bedtime as needed (uses as bedtime everynight and then occasionally iun the daytime as needed for SOB).     [provider]  potassium chloride SA (K-DUR,KLOR-CON) 20 MEQ tablet Take 2 tablets (40 mEq total) by mouth 2 (two) times daily. 07/04/16   Clegg, Amy D, NP  senna-docusate (SENOKOT-S) 8.6-50 MG tablet Take 1 tablet by mouth daily as needed for mild constipation.    [provider]  spironolactone (ALDACTONE) 25 MG tablet Take 0.5 tablets (12.5 mg total)  by mouth daily. 07/04/16   Clegg, Amy D, NP  torsemide (DEMADEX) 20 MG tablet Take 4 tablets (80 mg total) by mouth daily. Take additional 40 mg (2 tablets) in the afternoon on Tuesday, Thursday and Saturday 07/04/16   Darrick Grinder D, NP    Family History Family History  Problem Relation Age of Onset  . Stroke Mother   . Congestive Heart Failure Mother   . Tuberculosis Father   . Diabetes Brother   . Stroke Brother   . Cancer Brother   . Heart attack Son     Social History Social History  Substance Use Topics  . Smoking status: Former Research scientist (life sciences)  . Smokeless tobacco: Former Systems developer  . Alcohol use No     Allergies   Quinapril  hcl; Tape; Tessalon [benzonatate]; and Neosporin [neomycin-polymyxin-gramicidin]   Review of Systems Review of Systems  Constitutional: Negative for chills and fever.  HENT: Negative for ear pain and sore throat.   Eyes: Negative for pain and visual disturbance.  Respiratory: Positive for shortness of breath. Negative for cough.   Cardiovascular: Positive for leg swelling. Negative for chest pain and palpitations.  Gastrointestinal: Negative for abdominal pain, blood in stool, diarrhea and vomiting.  Genitourinary: Negative for dysuria and hematuria.  Musculoskeletal: Negative for arthralgias and back pain.  Skin: Negative for color change and rash.  Neurological: Negative for seizures and syncope.  Psychiatric/Behavioral: Negative for agitation and behavioral problems.  All other systems reviewed and are negative.    Physical Exam Updated Vital Signs BP 107/68   Pulse 86   Temp 97.6 F (36.4 C) (Oral)   Resp (!) 27   Ht 5\' 6"  (1.676 m)   Wt 90.7 kg (200 lb)   LMP  (LMP Unknown)   SpO2 98%   BMI 32.28 kg/m   Physical Exam  Constitutional: She appears well-developed and well-nourished. No distress.  HENT:  Head: Normocephalic and atraumatic.  Eyes: Conjunctivae are normal.  Neck: Neck supple. JVD present.  Cardiovascular: Normal rate, regular  rhythm and normal heart sounds.   No murmur heard. Pulmonary/Chest: No respiratory distress. She has rales (bibasilar) in the right lower field and the left lower field.  Abdominal: Soft. There is no tenderness.  Musculoskeletal: She exhibits no edema.  Neurological: She is alert.  Skin: Skin is warm and dry.  Psychiatric: She has a normal mood and affect. Her behavior is normal.  Nursing note and vitals reviewed.    ED Treatments / Results  Labs (all labs ordered are listed, but only abnormal results are displayed) Labs Reviewed  CBC WITH DIFFERENTIAL/PLATELET  BRAIN NATRIURETIC PEPTIDE  PROTIME-INR  COMPREHENSIVE METABOLIC PANEL  I-STAT TROPOININ, ED    EKG  EKG Interpretation  Date/Time:  Tuesday January 01 2017 13:31:23 EDT Ventricular Rate:  88 PR Interval:    QRS Duration: 189 QT Interval:  474 QTC Calculation: 574 R Axis:   -109 Text Interpretation:  Sinus rhythm Borderline short PR interval Nonspecific IVCD with LAD No significant change since last tracing Confirmed by Gareth Morgan [id number]) on 01/01/2017 1:38:42 PM       Radiology No results found.  Procedures Procedures (including critical care time)  Medications Ordered in ED Medications - No data to display   Initial Impression / Assessment and Plan / ED Course  I have reviewed the triage vital signs and the nursing notes.  Pertinent labs & imaging results that were available during my care of the patient were reviewed by me and considered in my medical decision making (see chart for details).     SOB CHF exacerbation -Clinically fluid overloaded see physical exam -BNP elevated yesterday and today -Lasix 80mg  IV   Final Clinical Impressions(s) / ED Diagnoses   Final diagnoses:  None    New Prescriptions New Prescriptions   No medications on file     Katherine Roan, MD 01/01/17 1519    Gareth Morgan, MD 01/02/17 1401

## 2017-01-01 NOTE — Progress Notes (Addendum)
New pt admission from ED. Pt brought to the floor in stable condition. Vitals taken. Paged ED NP for diet order.  Clarise Cruz RN

## 2017-01-01 NOTE — H&P (Signed)
History and Physical    Bianca [name] JSE:[id number] DOB: [birth year] DOA: 01/01/2017   PCP: Maurice Small, MD   Attending physician: Marily Memos  Patient coming from/Resides with: Independent Living at [facility]  Chief Complaint: SOB  HPI: Bianca [name] is a 83 y.o. female with medical history significant for hypertension, history of systolic heart failure with recovery of systolic function with medical therapy now with chronic diastolic heart failure, history of ICD, CAD, paroxysmal A. fib fibrillation on anticoagulation, sleep apnea on CPAP with 2 L O2 bleed in nocturnally and peripheral neuropathy. Patient presents to the ER with onset of shortness of breath this morning. The patient recently had her nortriptyline dosage increased and her Neurontin dosage decreased in an effort to manage her peripheral neuropathy better and since that time stat that facility have noticed patient being more lethargic than baseline. Patient saw her PCP yesterday who also noted excessive sleepiness.No apparent reports of respiratory symptoms at that time but it is reported at BNP was obtained and this was 1500 and the patient was instructed to take an extra dosage of spironolactone this morning. She apparently did not have any improvement in symptoms.  In addition to current reports of shortness of breath the patient was apparently hypoxemic while in triage although an actual pulse oximetry reading was not documented but O2 sats were 98% on 2 L oxygen. Patient remains sleepy but awakens and answers questions appropriately. She has not gained any way but there is some question as to whether she's had some subtle increases and lower extremity edema. BNP in the ER was 275 and chest x-ray was unremarkable.  ED Course:  Vital Signs: BP 101/67   Pulse 87   Temp 97.6 F (36.4 C) (Oral)   Resp (!) 21   Ht 5\' 6"  (1.676 m)   Wt 90.7 kg (200 lb)   LMP  (LMP Unknown)   SpO2 96%   BMI 32.28 kg/m  2 View CXR:  Neg Lab data: Sodium 140, potassium 4.1, chloride 104, CO2 27, glucose 109, BUN 17, creatinine 1.10, calcium 9.4, anion gap 9, LFTs normal, BNP 975, poc troponin 0.03, white count 8800 with normal differential, hemoglobin 12.4, platelets 190,000, PT 18, INR 1.47 Medications and treatments: Lasix 80 mg IV 1  Review of Systems:  In addition to the HPI above,  No Fever-chills, myalgias or other constitutional symptoms No Headache, changes with Vision or hearing, new weakness, tingling, numbness in any extremity, dizziness, dysarthria or word finding difficulty, gait disturbance or imbalance, tremors or seizure activity-she has been more lethargic than usual after adjustments and her nortriptyline and Neurontin dosages No problems swallowing food or Liquids, indigestion/reflux, choking or coughing while eating, abdominal pain with or after eating No Chest pain, Cough, palpitations, orthopnea  No Abdominal pain, N/V, melena,hematochezia, dark tarry stools, constipation No dysuria, malodorous urine, hematuria or flank pain No new skin rashes, lesions, masses or bruises, No new joint pains, aches, swelling or redness No recent unintentional weight gain or loss-uncertain as to whether she has developed true lower extremity edema in the past 24 hours No polyuria, polydypsia or polyphagia   Past Medical History:  Diagnosis Date  . Acute blood loss anemia   . Arthritis   . CAD (coronary artery disease)/LBBB 01/01/2017  . Chronic systolic heart failure ([place])   . CKD (chronic kidney disease), stage III 12/22/2015  . Depression 11/24/2015  . Hypertension   . Hypokalemia   . Myocardial infarction ([place]) 2008  . Neuromuscular disorder (  HCC)   . Paroxysmal atrial fibrillation ([place])   . Physical deconditioning   . Right flank hematoma   . Sleep apnea     Past Surgical History:  Procedure Laterality Date  . ABDOMINAL HYSTERECTOMY    . ANKLE SURGERY    . CARDIAC CATHETERIZATION    . CHOLECYSTECTOMY     . EP IMPLANTABLE DEVICE N/A 02/29/2016   Procedure: BIV ICD Generator Changeout;  Surgeon: Evans Lance, MD;  Location: [facility];  Service: Cardiovascular;  Laterality: N/A;  . EYE SURGERY    . INSERT / REPLACE / REMOVE PACEMAKER  2008  . KNEE LIGAMENT RECONSTRUCTION  2012  . TONSILLECTOMY      Social History   Social History  . Marital status: Widowed    Spouse name: N/A  . Number of children: N/A  . Years of education: N/A   Occupational History  . Not on file.   Social History Main Topics  . Smoking status: Former Research scientist (life sciences)  . Smokeless tobacco: Former Systems developer  . Alcohol use No  . Drug use: No  . Sexual activity: Not Currently   Other Topics Concern  . Not on file   Social History Narrative  . No narrative on file    Mobility: Rolling walker Work history: Retired   Allergies  Allergen Reactions  . Quinapril Hcl Swelling    Tongue and throat  . Tape Other (See Comments)    Plastic tape causes irritation.   Lavella Lemons [Benzonatate] Swelling and Other (See Comments)    Capsule opened in mouth, not an allergy  . Neosporin [Neomycin-Polymyxin-Gramicidin] Rash    Family History  Problem Relation Age of Onset  . Stroke Mother   . Congestive Heart Failure Mother   . Tuberculosis Father   . Diabetes Brother   . Stroke Brother   . Cancer Brother   . Heart attack Son       Prior to Admission medications   Medication Sig Start Date End Date Taking? Authorizing Provider  acetaminophen (TYLENOL) 325 MG tablet Take 650 mg by mouth 2 (two) times daily as needed for mild pain or headache.     [provider]  allopurinol (ZYLOPRIM) 100 MG tablet Take 100 mg by mouth See admin instructions. Takes along with a 300 mg tablet to equal 400 mg     [provider]  allopurinol (ZYLOPRIM) 300 MG tablet Take 300 mg by mouth See admin instructions. Take with the 100mg  tablet to equal 400mg  daily     [provider]  atorvastatin (LIPITOR) 10  MG tablet Take 1 tablet (10 mg total) by mouth daily at 6 PM. 07/04/16   Clegg, Amy D, NP  benzonatate (TESSALON) 200 MG capsule Take 200 mg by mouth 3 (three) times daily as needed for cough. 12/17/16   [provider]  bisoprolol (ZEBETA) 5 MG tablet Take 0.5 tablets (2.5 mg total) by mouth at bedtime. 07/04/16   Clegg, Amy D, NP  Calcium Carbonate-Vit D-Min (CALCIUM/VITAMIN D/MINERALS) 600-200 MG-UNIT TABS Take 1 tablet by mouth daily.    [provider]  Cholecalciferol (VITAMIN D3) 1000 UNITS CAPS Take 1 capsule by mouth daily.    [provider]  colestipol (COLESTID) 1 G tablet Take 2 g by mouth at bedtime.     [provider]  ELIQUIS 5 MG TABS tablet TAKE 1 TABLET TWICE DAILY 12/28/16   Larey Dresser, MD  gabapentin (NEURONTIN) 800 MG tablet Take 800 mg  by mouth 3 (three) times daily.    [provider]  guaiFENesin (MUCINEX) 600 MG 12 hr tablet Take 1 tablet (600 mg total) by mouth 2 (two) times daily. 11/29/15   Theodis Blaze, MD  guaiFENesin-codeine 100-10 MG/5ML syrup Take 5 mLs by mouth at bedtime as needed for cough. 12/17/16   [provider]  nitroGLYCERIN (NITROSTAT) 0.4 MG SL tablet Place 1 tablet (0.4 mg total) under the tongue every 5 (five) minutes as needed for chest pain. 11/24/12   Jettie Booze, MD  nortriptyline (PAMELOR) 25 MG capsule Take 25 mg by mouth at bedtime.    [provider]  OXYGEN Inhale 2 L/min into the lungs at bedtime as needed (uses as bedtime everynight and then occasionally iun the daytime as needed for SOB).     [provider]  potassium chloride SA (K-DUR,KLOR-CON) 20 MEQ tablet Take 2 tablets (40 mEq total) by mouth 2 (two) times daily. 07/04/16   Clegg, Amy D, NP  senna-docusate (SENOKOT-S) 8.6-50 MG tablet Take 1 tablet by mouth daily as needed for mild constipation.    [provider]  spironolactone (ALDACTONE) 25 MG tablet Take 0.5 tablets (12.5 mg total) by mouth  daily. 07/04/16   Clegg, Amy D, NP  torsemide (DEMADEX) 20 MG tablet Take 4 tablets (80 mg total) by mouth daily. Take additional 40 mg (2 tablets) in the afternoon on Tuesday, Thursday and Saturday 07/04/16   Darrick Grinder D, NP    Physical Exam: Vitals:   01/01/17 1445 01/01/17 1500 01/01/17 1515 01/01/17 1535  BP: 107/66 111/71 104/68 101/67  Pulse: 88 86 88 87  Resp: 18 (!) 22 (!) 22 (!) 21  Temp:      TempSrc:      SpO2:   96% 96%  Weight:      Height:          Constitutional: NAD, calm, comfortable-Very sleepy but awakens easily Eyes: PERRL, lids and conjunctivae normal ENMT: Mucous membranes are moist. Posterior pharynx clear of any exudate or lesions.Normal dentition.  Neck: normal, supple, no masses, no thyromegaly Respiratory: clear to auscultation bilaterally, no wheezing, no crackles. Decreased in the bases Normal respiratory effort. No accessory muscle use. 2 L Cardiovascular: Regular rate and rhythm, no murmurs / rubs / gallops. Trace bilateral lower extremity edema. 2+ pedal pulses. No carotid bruits.  Abdomen: no tenderness, no masses palpated. No hepatosplenomegaly. Bowel sounds positive.  Musculoskeletal: no clubbing / cyanosis. No joint deformity upper and lower extremities. Good ROM, no contractures. Normal muscle tone.  Skin: no rashes, lesions, ulcers. No induration Neurologic: CN 2-12 grossly intact. Sensation intact, DTR normal. Unable to accurately test strength given sedated state but on basic observation of spontaneous movement no focal neurological deficits appreciated Psychiatric: Awakens and is oriented to name and place currently. Remains quite sleepy. Normal mood.    Labs on Admission: I have personally reviewed following labs and imaging studies  CBC:  Recent Labs Lab 01/01/17 1339  WBC 8.8  NEUTROABS 4.9  HGB 12.4  HCT 38.5  MCV 96.5  PLT 976   Basic Metabolic Panel:  Recent Labs Lab 01/01/17 1424  NA 140  K 4.1  CL 104  CO2 27   GLUCOSE 109*  BUN 17  CREATININE 1.10*  CALCIUM 9.4   GFR: Estimated Creatinine Clearance: 44.8 mL/min (A) (by C-G formula based on SCr of 1.1 mg/dL (H)). Liver Function Tests:  Recent Labs Lab 01/01/17 1424  AST 20  ALT  14  ALKPHOS 69  BILITOT 1.0  PROT 7.0  ALBUMIN 4.1   No results for input(s): LIPASE, AMYLASE in the last 168 hours. No results for input(s): AMMONIA in the last 168 hours. Coagulation Profile:  Recent Labs Lab 01/01/17 1424  INR 1.47   Cardiac Enzymes: No results for input(s): CKTOTAL, CKMB, CKMBINDEX, TROPONINI in the last 168 hours. BNP (last 3 results) No results for input(s): PROBNP in the last 8760 hours. HbA1C: No results for input(s): HGBA1C in the last 72 hours. CBG: No results for input(s): GLUCAP in the last 168 hours. Lipid Profile: No results for input(s): CHOL, HDL, LDLCALC, TRIG, CHOLHDL, LDLDIRECT in the last 72 hours. Thyroid Function Tests: No results for input(s): TSH, T4TOTAL, FREET4, T3FREE, THYROIDAB in the last 72 hours. Anemia Panel: No results for input(s): VITAMINB12, FOLATE, FERRITIN, TIBC, IRON, RETICCTPCT in the last 72 hours. Urine analysis:    Component Value Date/Time   COLORURINE YELLOW 01/01/2016 0741   APPEARANCEUR CLEAR 01/01/2016 0741   LABSPEC 1.010 01/01/2016 0741   PHURINE 6.5 01/01/2016 0741   GLUCOSEU NEGATIVE 01/01/2016 0741   HGBUR NEGATIVE 01/01/2016 0741   BILIRUBINUR NEGATIVE 01/01/2016 0741   KETONESUR NEGATIVE 01/01/2016 0741   PROTEINUR NEGATIVE 01/01/2016 0741   UROBILINOGEN 0.2 11/21/2012 1750   NITRITE NEGATIVE 01/01/2016 0741   LEUKOCYTESUR TRACE (A) 01/01/2016 0741   Sepsis Labs: @LABRCNTIP (procalcitonin:4,lacticidven:4) )No results found for this or any previous visit (from the past 240 hour(s)).   Radiological Exams on Admission: Dg Chest 2 View  Result Date: 01/01/2017 CLINICAL DATA:  Shortness of breath. EXAM: CHEST  2 VIEW COMPARISON:  07/09/2016. FINDINGS: Cardiac pacer  with lead tips in right scratched it AICD no with lead tips in stable position. Cardiomegaly with normal pulmonary vascularity. No focal infiltrate. No pleural effusion or pneumothorax. IMPRESSION: 1. AICD in stable position.  Stable cardiomegaly. 2. No acute pulmonary disease. Electronically Signed   By: Marcello Moores  Register   On: 01/01/2017 15:14    EKG: (Independently reviewed) Sinus rhythm with underlying left bundle branch block with associated prolonged QTC 574 ms,-unchanged from prior EKG  Assessment/Plan Principal Problem:   Acute diastolic heart failure  -Presents with abrupt onset shortness of breath with no edema on chest x-ray but elevated BNP and trace lower extremity edema-symptoms did not improve after administration of extra dose of spironolactone this morning -Treat as acute diastolic heart failure -Give Lasix 60 mg IV 1 more dose in a.m. noting systolic blood pressure quite low in the 90s (on Demadex and Aldactone prior to admission) -Hold spironolactone and bisoprolol -Not on ACE inhibitor secondary to CKD -Cardiology contacted-they will send representative to scan OptiVol to determine impedance values to clarify if experiencing heart failure; last impedance values were obtained on 6/21 at the office and patient has remained quite stable regarding her volume status over multiple months -Last echocardiogram 2017: EF 50-55% with grade 1 diastolic dysfunction -Daily weights and strict I/O  Active Problems:   ICD (implantable cardioverter-defibrillator) in place -Has had full recovery of EF with proper medical therapies    Hypertension -Current blood pressure suboptimal -Holding medications as above    CAD (coronary artery disease)/LBBB -Currently asymptomatic -Beta blocker on hold -Continue statin -Not on aspirin since otherwise fully anticoagulated    CKD (chronic kidney disease) stage 3, GFR 30-59 ml/min -Renal function stable and near baseline -Follow closely with  diuresis    Sleep apnea -Continue nocturnal CPAP with bleeding in 2 L oxygen    History of  ventricular tachycardia -See above regarding ICD    PAF (paroxysmal atrial fibrillation)  -Beta blocker on hold as above -Currently maintaining sinus rhythm -Continue preadmission eliquis -CHAD2VASc2 =6    Peripheral neuropathy -Recent increase in nortriptyline with a decrease in Neurontin by PCP with resultant excessive daytime sleepiness -Yesterday PCP decreased nortriptyline back to previous dose and increase Neurontin -Patient remains quite sleepy so we'll hold both of these medications for now    Obesity (BMI 30-39.9)      DVT prophylaxis: Eliquis  Code Status: Full  Family Communication: Daughter at bedside  Disposition Plan: Independent living at [facility] called: Cardiology/requested rep interrogate OptiVol    Bresha Hosack L. ANP-BC [facility] Pager [phone]   If 7PM-7AM, please contact night-coverage [email or web address] Password [email or web address]  01/01/2017, 4:12 PM

## 2017-01-02 ENCOUNTER — Observation Stay (HOSPITAL_COMMUNITY): Payer: PPO

## 2017-01-02 DIAGNOSIS — Z7901 Long term (current) use of anticoagulants: Secondary | ICD-10-CM | POA: Diagnosis not present

## 2017-01-02 DIAGNOSIS — H9190 Unspecified hearing loss, unspecified ear: Secondary | ICD-10-CM | POA: Diagnosis present

## 2017-01-02 DIAGNOSIS — Z883 Allergy status to other anti-infective agents status: Secondary | ICD-10-CM | POA: Diagnosis not present

## 2017-01-02 DIAGNOSIS — G629 Polyneuropathy, unspecified: Secondary | ICD-10-CM | POA: Diagnosis present

## 2017-01-02 DIAGNOSIS — I5031 Acute diastolic (congestive) heart failure: Secondary | ICD-10-CM | POA: Diagnosis not present

## 2017-01-02 DIAGNOSIS — I447 Left bundle-branch block, unspecified: Secondary | ICD-10-CM | POA: Diagnosis present

## 2017-01-02 DIAGNOSIS — Z9981 Dependence on supplemental oxygen: Secondary | ICD-10-CM | POA: Diagnosis not present

## 2017-01-02 DIAGNOSIS — Z8679 Personal history of other diseases of the circulatory system: Secondary | ICD-10-CM | POA: Diagnosis not present

## 2017-01-02 DIAGNOSIS — G4733 Obstructive sleep apnea (adult) (pediatric): Secondary | ICD-10-CM | POA: Diagnosis present

## 2017-01-02 DIAGNOSIS — E669 Obesity, unspecified: Secondary | ICD-10-CM | POA: Diagnosis present

## 2017-01-02 DIAGNOSIS — I272 Pulmonary hypertension, unspecified: Secondary | ICD-10-CM | POA: Diagnosis present

## 2017-01-02 DIAGNOSIS — Z888 Allergy status to other drugs, medicaments and biological substances status: Secondary | ICD-10-CM | POA: Diagnosis not present

## 2017-01-02 DIAGNOSIS — I361 Nonrheumatic tricuspid (valve) insufficiency: Secondary | ICD-10-CM | POA: Diagnosis not present

## 2017-01-02 DIAGNOSIS — I5033 Acute on chronic diastolic (congestive) heart failure: Secondary | ICD-10-CM | POA: Diagnosis not present

## 2017-01-02 DIAGNOSIS — Z823 Family history of stroke: Secondary | ICD-10-CM | POA: Diagnosis not present

## 2017-01-02 DIAGNOSIS — R05 Cough: Secondary | ICD-10-CM | POA: Diagnosis not present

## 2017-01-02 DIAGNOSIS — I130 Hypertensive heart and chronic kidney disease with heart failure and stage 1 through stage 4 chronic kidney disease, or unspecified chronic kidney disease: Secondary | ICD-10-CM | POA: Diagnosis present

## 2017-01-02 DIAGNOSIS — K5900 Constipation, unspecified: Secondary | ICD-10-CM | POA: Diagnosis present

## 2017-01-02 DIAGNOSIS — I429 Cardiomyopathy, unspecified: Secondary | ICD-10-CM | POA: Diagnosis present

## 2017-01-02 DIAGNOSIS — Z79899 Other long term (current) drug therapy: Secondary | ICD-10-CM | POA: Diagnosis not present

## 2017-01-02 DIAGNOSIS — I2510 Atherosclerotic heart disease of native coronary artery without angina pectoris: Secondary | ICD-10-CM | POA: Diagnosis present

## 2017-01-02 DIAGNOSIS — N183 Chronic kidney disease, stage 3 (moderate): Secondary | ICD-10-CM | POA: Diagnosis present

## 2017-01-02 DIAGNOSIS — I480 Paroxysmal atrial fibrillation: Secondary | ICD-10-CM | POA: Diagnosis present

## 2017-01-02 DIAGNOSIS — Z95810 Presence of automatic (implantable) cardiac defibrillator: Secondary | ICD-10-CM | POA: Diagnosis not present

## 2017-01-02 DIAGNOSIS — I5043 Acute on chronic combined systolic (congestive) and diastolic (congestive) heart failure: Secondary | ICD-10-CM | POA: Diagnosis present

## 2017-01-02 DIAGNOSIS — I10 Essential (primary) hypertension: Secondary | ICD-10-CM | POA: Diagnosis not present

## 2017-01-02 DIAGNOSIS — Z6836 Body mass index (BMI) 36.0-36.9, adult: Secondary | ICD-10-CM | POA: Diagnosis not present

## 2017-01-02 DIAGNOSIS — E785 Hyperlipidemia, unspecified: Secondary | ICD-10-CM | POA: Diagnosis present

## 2017-01-02 DIAGNOSIS — Z9989 Dependence on other enabling machines and devices: Secondary | ICD-10-CM | POA: Diagnosis not present

## 2017-01-02 DIAGNOSIS — I5041 Acute combined systolic (congestive) and diastolic (congestive) heart failure: Secondary | ICD-10-CM | POA: Diagnosis not present

## 2017-01-02 DIAGNOSIS — I252 Old myocardial infarction: Secondary | ICD-10-CM | POA: Diagnosis not present

## 2017-01-02 DIAGNOSIS — Z8249 Family history of ischemic heart disease and other diseases of the circulatory system: Secondary | ICD-10-CM | POA: Diagnosis not present

## 2017-01-02 LAB — BASIC METABOLIC PANEL
Anion gap: 10 (ref 5–15)
BUN: 18 mg/dL (ref 6–20)
CO2: 30 mmol/L (ref 22–32)
CREATININE: 1.17 mg/dL — AB (ref 0.44–1.00)
Calcium: 9.1 mg/dL (ref 8.9–10.3)
Chloride: 101 mmol/L (ref 101–111)
GFR calc Af Amer: 49 mL/min — ABNORMAL LOW (ref >60)
GFR calc non Af Amer: 42 mL/min — ABNORMAL LOW (ref >60)
Glucose, Bld: 108 mg/dL — ABNORMAL HIGH (ref 65–99)
Potassium: 3.7 mmol/L (ref 3.5–5.1)
SODIUM: 141 mmol/L (ref 135–145)

## 2017-01-02 LAB — ECHOCARDIOGRAM COMPLETE
Height: 66 in
WEIGHTICAEL: 3620.8 [oz_av]

## 2017-01-02 MED ORDER — FUROSEMIDE 10 MG/ML IJ SOLN
40.0000 mg | Freq: Two times a day (BID) | INTRAMUSCULAR | Status: AC
  Administered 2017-01-02 – 2017-01-03 (×2): 40 mg via INTRAVENOUS
  Filled 2017-01-02 (×2): qty 4

## 2017-01-02 MED ORDER — GABAPENTIN 800 MG PO TABS
800.0000 mg | ORAL_TABLET | Freq: Three times a day (TID) | ORAL | Status: DC
  Filled 2017-01-02 (×2): qty 1

## 2017-01-02 MED ORDER — FUROSEMIDE 10 MG/ML IJ SOLN: 40.0000 mg | Freq: Two times a day (BID) | INTRAMUSCULAR | Status: DC

## 2017-01-02 MED ORDER — NORTRIPTYLINE HCL 25 MG PO CAPS
25.0000 mg | ORAL_CAPSULE | Freq: Every day | ORAL | Status: DC
  Administered 2017-01-02 – 2017-01-03 (×2): 25 mg via ORAL
  Filled 2017-01-02 (×2): qty 1

## 2017-01-02 MED ORDER — GABAPENTIN 400 MG PO CAPS
800.0000 mg | ORAL_CAPSULE | Freq: Three times a day (TID) | ORAL | Status: DC
  Administered 2017-01-02 – 2017-01-04 (×6): 800 mg via ORAL
  Filled 2017-01-02 (×6): qty 2

## 2017-01-02 NOTE — Progress Notes (Signed)
Patient has home CPAP unit in room that she places on and off herself. Patient already wearing CPAP when RT entered room. Patient resting comfortably. RT will monitor as needed.

## 2017-01-02 NOTE — Progress Notes (Signed)
  Echocardiogram 2D Echocardiogram has been performed.  Bianca [name] 01/02/2017, 1:59 PM

## 2017-01-02 NOTE — Progress Notes (Signed)
PROGRESS NOTE    KALINDA ROMANIELLO  OZD:[id number] DOB: 20-Aug-1932 DOA: 01/01/2017 PCP: Maurice Small, MD   Outpatient Specialists:     Brief Narrative:  Bianca [name] is a 83 y.o. female who presents with acute on chronic diastolic congestive heart failure. Repeat Echo ordered. Per Dr. Marily Memos: CXR, pt w/ increasing cardiomegaly w/ mild pulmonary vascular congestion.    Assessment & Plan:   Principal Problem:   Acute diastolic heart failure (HCC) Active Problems:   Hypertension   Sleep apnea   CAD (coronary artery disease)/LBBB   History of ventricular tachycardia   PAF (paroxysmal atrial fibrillation) (HCC)   CKD (chronic kidney disease) stage 3, GFR 30-59 ml/min   Peripheral neuropathy   OSA on CPAP   Obesity (BMI 30-39.9)   ICD (implantable cardioverter-defibrillator) in place   Acute diastolic heart failure  -Given Lasix in ER with 1.6L deficit this AM -Hold spironolactone and bisoprolol -Not on ACE inhibitor secondary to CKD -Cardiology contacted by admitting NP: they will send representative to scan OptiVol to determine impedance values to clarify if experiencing heart failure; last impedance values were obtained on 6/21 at the office and patient has remained quite stable regarding her volume status over multiple months -Last echocardiogram 2017: EF 50-55% with grade 1 diastolic dysfunction -Daily weights and strict I/O- last few weights per cards 224-228    ICD (implantable cardioverter-defibrillator) in place -Has had full recovery of EF with proper medical therapies    Hypertension -Current blood pressure suboptimal -Holding medications as above    CAD (coronary artery disease)/LBBB -Currently asymptomatic -Beta blocker on hold -Continue statin -Not on aspirin since otherwise fully anticoagulated    CKD (chronic kidney disease) stage 3, GFR 30-59 ml/min -Renal function stable and near baseline -Follow closely with diuresis    Sleep  apnea -Continue nocturnal CPAP    History of ventricular tachycardia -See above regarding ICD    PAF (paroxysmal atrial fibrillation)  -Beta blocker on hold as above -Currently maintaining sinus rhythm -Continue preadmission eliquis -CHAD2VASc2 =6    Peripheral neuropathy -Recent increase in nortriptyline with a decrease in Neurontin by PCP with resultant excessive daytime sleepiness -Yesterday PCP decreased nortriptyline back to previous dose and increase Neurontin -resume meds at prior dose    Obesity (BMI 30-39.9)   DVT prophylaxis:  Fully anticoagulated   Code Status: Full Code   Family Communication: Son at bedside  Disposition Plan:     Consultants:       Subjective: Got short of breath when up walking  + cough  Objective: Vitals:   01/01/17 1928 01/02/17 0043 01/02/17 0602 01/02/17 0900  BP: 131/69 128/71  106/74  Pulse: 84 81  77  Resp: 18   18  Temp: 98.7 F (37.1 C) 97.9 F (36.6 C)  98.4 F (36.9 C)  TempSrc: Oral Oral  Oral  SpO2: 98% 97%  95%  Weight:   102.6 kg (226 lb 4.8 oz)   Height:        Intake/Output Summary (Last 24 hours) at 01/02/17 1326 Last data filed at 01/02/17 0900  Gross per 24 hour  Intake              240 ml  Output             1851 ml  Net            -1611 ml   Filed Weights   01/01/17 1328 01/01/17 1812 01/02/17 0602  Weight: 90.7 kg (200  lb) 103.9 kg (229 lb 1.6 oz) 102.6 kg (226 lb 4.8 oz)    Examination:  General exam: Appears calm and comfortable- in chair  Respiratory system: diminished, few crackles Cardiovascular system: rrr, min LE edema Gastrointestinal system: Abdomen is nondistended, soft and nontender. No organomegaly or masses felt. Normal bowel sounds heard. Central nervous system: Alert and oriented. No focal neurological deficits. Extremities: Symmetric 5 x 5 power. Skin: No rashes, lesions or ulcers Psychiatry: Judgement and insight appear normal. Mood & affect appropriate- mildly  hard of hearing.     Data Reviewed: I have personally reviewed following labs and imaging studies  CBC:  Recent Labs Lab 01/01/17 1339  WBC 8.8  NEUTROABS 4.9  HGB 12.4  HCT 38.5  MCV 96.5  PLT 409   Basic Metabolic Panel:  Recent Labs Lab 01/01/17 1424 01/02/17 0418  NA 140 141  K 4.1 3.7  CL 104 101  CO2 27 30  GLUCOSE 109* 108*  BUN 17 18  CREATININE 1.10* 1.17*  CALCIUM 9.4 9.1   GFR: Estimated Creatinine Clearance: 44.8 mL/min (A) (by C-G formula based on SCr of 1.17 mg/dL (H)). Liver Function Tests:  Recent Labs Lab 01/01/17 1424  AST 20  ALT 14  ALKPHOS 69  BILITOT 1.0  PROT 7.0  ALBUMIN 4.1   No results for input(s): LIPASE, AMYLASE in the last 168 hours. No results for input(s): AMMONIA in the last 168 hours. Coagulation Profile:  Recent Labs Lab 01/01/17 1424  INR 1.47   Cardiac Enzymes: No results for input(s): CKTOTAL, CKMB, CKMBINDEX, TROPONINI in the last 168 hours. BNP (last 3 results) No results for input(s): PROBNP in the last 8760 hours. HbA1C: No results for input(s): HGBA1C in the last 72 hours. CBG: No results for input(s): GLUCAP in the last 168 hours. Lipid Profile: No results for input(s): CHOL, HDL, LDLCALC, TRIG, CHOLHDL, LDLDIRECT in the last 72 hours. Thyroid Function Tests: No results for input(s): TSH, T4TOTAL, FREET4, T3FREE, THYROIDAB in the last 72 hours. Anemia Panel: No results for input(s): VITAMINB12, FOLATE, FERRITIN, TIBC, IRON, RETICCTPCT in the last 72 hours. Urine analysis:    Component Value Date/Time   COLORURINE YELLOW 01/01/2016 0741   APPEARANCEUR CLEAR 01/01/2016 0741   LABSPEC 1.010 01/01/2016 0741   PHURINE 6.5 01/01/2016 0741   GLUCOSEU NEGATIVE 01/01/2016 0741   HGBUR NEGATIVE 01/01/2016 0741   BILIRUBINUR NEGATIVE 01/01/2016 0741   KETONESUR NEGATIVE 01/01/2016 0741   PROTEINUR NEGATIVE 01/01/2016 0741   UROBILINOGEN 0.2 11/21/2012 1750   NITRITE NEGATIVE 01/01/2016 0741    LEUKOCYTESUR TRACE (A) 01/01/2016 0741     )No results found for this or any previous visit (from the past 240 hour(s)).    Anti-infectives    None       Radiology Studies: Dg Chest 2 View  Result Date: 01/01/2017 CLINICAL DATA:  Shortness of breath. EXAM: CHEST  2 VIEW COMPARISON:  07/09/2016. FINDINGS: Cardiac pacer with lead tips in right scratched it AICD no with lead tips in stable position. Cardiomegaly with normal pulmonary vascularity. No focal infiltrate. No pleural effusion or pneumothorax. IMPRESSION: 1. AICD in stable position.  Stable cardiomegaly. 2. No acute pulmonary disease. Electronically Signed   By: Marcello Moores  Register   On: 01/01/2017 15:14   Dg Chest Port 1 View  Result Date: 01/02/2017 CLINICAL DATA:  Cough EXAM: PORTABLE CHEST 1 VIEW COMPARISON:  01/01/2017 FINDINGS: Cardiac shadow remains enlarged. A defibrillator is again seen and stable. The lungs are well aerated bilaterally. No  focal infiltrate or sizable effusion is seen. No bony abnormality is noted. IMPRESSION: Chronic changes without acute abnormality. Electronically Signed   By: Inez Catalina M.D.   On: 01/02/2017 08:23        Scheduled Meds: . allopurinol  400 mg Oral Daily  . apixaban  5 mg Oral BID  . atorvastatin  10 mg Oral q1800  . bisoprolol  2.5 mg Oral QHS  . calcium-vitamin D  1 tablet Oral Q breakfast  . cholecalciferol  1,000 Units Oral Daily  . colestipol  2 g Oral QHS  . furosemide  40 mg Intravenous BID  . potassium chloride SA  40 mEq Oral BID  . sodium chloride flush  3 mL Intravenous Q12H   Continuous Infusions: . sodium chloride       LOS: 0 days    Time spent: 25 min    [place], DO [facility] Pager [phone]  If 7PM-7AM, please contact night-coverage [email or web address] Password [email or web address] 01/02/2017, 1:26 PM

## 2017-01-02 NOTE — Care Management Note (Signed)
Case Management Note  Patient Details  Name: Bianca [name] MRN: [id number] Date of Birth: 09/21/1932  Subjective/Objective:   CHF                Action/Plan: Patient resides at an [facility]; PCP: Maurice Small, MD; has private insurance with [employer] with prescription drug coverage;; CM following for DCP  Expected Discharge Date:  01/04/17               Expected Discharge Plan: possible [place]  In-House Referral:   [facility]  Discharge planning Services  CM Consult  Status of Service:  In process, will continue to follow  [name] [phone] 01/02/2017, 10:58 AM

## 2017-01-02 NOTE — Evaluation (Signed)
Physical Therapy Evaluation Patient Details Name: Bianca [name] MRN: [id number] DOB: [birth year] Today's Date: 01/02/2017   History of Present Illness  83 yo female with onset of CHF and SOB at her ALF.  Increased cardiomegaly, pulm vasc congestion, active PN with irritation of feet.  PMHx:  CHF, HTN, vent tachy, PAF, CKD 3, PN, ICD  Clinical Impression  Pt is having some difficulty with pain, related to peripheral neuropathy and her feet.  Talked with her about shoes specially ordered years ago, may need to get new for comfort of feet.  Will follow acutely to walk and strengthen LE's and will monitor her pain as needed to manage comfort.  Pt is motivated to work despite pain and will be a good candidate to progress from [place] at home to outpatient therapy to continue to progress her gait.    Follow Up Recommendations Home health PT;Supervision/Assistance - 24 hour;Supervision - Intermittent    Equipment Recommendations  None recommended by PT    Recommendations for Other Services       Precautions / Restrictions Precautions Precautions: Fall;ICD/Pacemaker Restrictions Weight Bearing Restrictions: No      Mobility  Bed Mobility Overal bed mobility: Modified Independent                Transfers Overall transfer level: Needs assistance   Transfers: Sit to/from Stand Sit to Stand: Min guard         General transfer comment: reminded her about transitional hand placement  Ambulation/Gait Ambulation/Gait assistance: Min guard Ambulation Distance (Feet): 90 Feet Assistive device: Rolling walker (2 wheeled) Gait Pattern/deviations: Step-through pattern;Wide base of support;Shuffle;Decreased stride length Gait velocity: reduced Gait velocity interpretation: Below normal speed for age/gender General Gait Details: shorter steps and careful with turns  Stairs            Wheelchair Mobility    Modified Rankin (Stroke Patients Only)       Balance Overall  balance assessment: Needs assistance Sitting-balance support: Feet supported Sitting balance-Leahy Scale: Good     Standing balance support: Bilateral upper extremity supported Standing balance-Leahy Scale: Fair                               Pertinent Vitals/Pain Pain Assessment: No/denies pain    Home Living Family/patient expects to be discharged to:: Assisted living               Home Equipment: Walker - 4 wheels;Walker - 2 wheels;Cane - single point;Shower seat      Prior Function Level of Independence: Independent with assistive device(s)         Comments: was able to perform her own care of bathing and dressing     Hand Dominance   Dominant Hand: Right    Extremity/Trunk Assessment   Upper Extremity Assessment Upper Extremity Assessment: Overall WFL for tasks assessed    Lower Extremity Assessment Lower Extremity Assessment: Generalized weakness    Cervical / Trunk Assessment Cervical / Trunk Assessment: Normal  Communication   Communication: No difficulties  Cognition Arousal/Alertness: Awake/alert Behavior During Therapy: WFL for tasks assessed/performed Overall Cognitive Status: Within Functional Limits for tasks assessed                                        General Comments General comments (skin integrity, edema, etc.): Pt is demonstrating some  awareness of safety, but tends to overdo her walk distances, and reminded her not to get her feet irritated unnecessarily    Exercises     Assessment/Plan    PT Assessment Patient needs continued PT services  PT Problem List Decreased strength;Decreased range of motion;Decreased activity tolerance;Decreased balance;Decreased mobility;Decreased coordination;Decreased safety awareness;Cardiopulmonary status limiting activity;Obesity;Pain       PT Treatment Interventions DME instruction;Gait training;Functional mobility training;Therapeutic activities;Therapeutic  exercise;Balance training;Neuromuscular re-education;Patient/family education    PT Goals (Current goals can be found in the Care Plan section)  Acute Rehab PT Goals Patient Stated Goal: to get stronger PT Goal Formulation: With patient/family Time For Goal Achievement: 01/16/17 Potential to Achieve Goals: Good    Frequency Min 3X/week   Barriers to discharge Inaccessible home environment (longer hallway trips)      Co-evaluation               AM-PAC PT "6 Clicks" Daily Activity  Outcome Measure Difficulty turning over in bed (including adjusting bedclothes, sheets and blankets)?: A Little Difficulty moving from lying on back to sitting on the side of the bed? : A Little Difficulty sitting down on and standing up from a chair with arms (e.g., wheelchair, bedside commode, etc,.)?: A Little Help needed moving to and from a bed to chair (including a wheelchair)?: A Little Help needed walking in hospital room?: A Little Help needed climbing 3-5 steps with a railing? : A Lot 6 Click Score: 17    End of Session Equipment Utilized During Treatment: Gait belt Activity Tolerance: No increased pain;Patient tolerated treatment well Patient left: in bed;with call bell/phone within reach;with bed alarm set;with family/visitor present Nurse Communication: Mobility status PT Visit Diagnosis: Unsteadiness on feet (R26.81);Pain;Difficulty in walking, not elsewhere classified (R26.2) Pain - Right/Left:  (Both) Pain - part of body: Ankle and joints of foot (feet )    Time: 5701-7793 PT Time Calculation (min) (ACUTE ONLY): 23 min   Charges:   PT Evaluation $PT Eval Low Complexity: 1 Procedure PT Treatments $Gait Training: 8-22 mins   PT G Codes:   PT G-Codes **NOT FOR INPATIENT CLASS** Functional Assessment Tool Used: AM-PAC 6 Clicks Basic Mobility;Clinical judgement Functional Limitation: Mobility: Walking and moving around Mobility: Walking and Moving Around Current Status  (J0300): At least 20 percent but less than 40 percent impaired, limited or restricted Mobility: Walking and Moving Around Goal Status [phone]): At least 1 percent but less than 20 percent impaired, limited or restricted    Ramond Dial 01/02/2017, 2:03 PM   Mee Hives, PT MS Acute Rehab Dept. Number: [place] [facility] [place]

## 2017-01-02 NOTE — Discharge Instructions (Addendum)
°  Dr. Marlou Porch office will call for outpatient stress test and a appointment with Dr. Lovena Le or his NP.    Weigh daily, call if wt increases by 3 lbs in a day or 5 lbs in a week.  Call for shortness of breath.      Information on my medicine - ELIQUIS (apixaban)  Why was Eliquis prescribed for you? Eliquis was prescribed for you to reduce the risk of a blood clot forming that can cause a stroke if you have a medical condition called atrial fibrillation (a type of irregular heartbeat).  What do You need to know about Eliquis ? Take your Eliquis TWICE DAILY - one tablet in the morning and one tablet in the evening with or without food. If you have difficulty swallowing the tablet whole please discuss with your pharmacist how to take the medication safely.  Take Eliquis exactly as prescribed by your doctor and DO NOT stop taking Eliquis without talking to the doctor who prescribed the medication.  Stopping may increase your risk of developing a stroke.  Refill your prescription before you run out.  After discharge, you should have regular check-up appointments with your healthcare provider that is prescribing your Eliquis.  In the future your dose may need to be changed if your kidney function or weight changes by a significant amount or as you get older.  What do you do if you miss a dose? If you miss a dose, take it as soon as you remember on the same day and resume taking twice daily.  Do not take more than one dose of ELIQUIS at the same time to make up a missed dose.  Important Safety Information A possible side effect of Eliquis is bleeding. You should call your healthcare provider right away if you experience any of the following: ? Bleeding from an injury or your nose that does not stop. ? Unusual colored urine (red or dark brown) or unusual colored stools (red or black). ? Unusual bruising for unknown reasons. ? A serious fall or if you hit your head (even if there is no  bleeding).  Some medicines may interact with Eliquis and might increase your risk of bleeding or clotting while on Eliquis. To help avoid this, consult your healthcare provider or pharmacist prior to using any new prescription or non-prescription medications, including herbals, vitamins, non-steroidal anti-inflammatory drugs (NSAIDs) and supplements.  This website has more information on Eliquis (apixaban): http://www.eliquis.com/eliquis/home

## 2017-01-03 ENCOUNTER — Other Ambulatory Visit: Payer: Self-pay | Admitting: Cardiology

## 2017-01-03 DIAGNOSIS — I5031 Acute diastolic (congestive) heart failure: Secondary | ICD-10-CM

## 2017-01-03 DIAGNOSIS — I5020 Unspecified systolic (congestive) heart failure: Secondary | ICD-10-CM

## 2017-01-03 DIAGNOSIS — I420 Dilated cardiomyopathy: Secondary | ICD-10-CM

## 2017-01-03 LAB — URINALYSIS, ROUTINE W REFLEX MICROSCOPIC
Bilirubin Urine: NEGATIVE
GLUCOSE, UA: NEGATIVE mg/dL
HGB URINE DIPSTICK: NEGATIVE
Ketones, ur: NEGATIVE mg/dL
NITRITE: POSITIVE — AB
Protein, ur: NEGATIVE mg/dL
SPECIFIC GRAVITY, URINE: 1.006 (ref 1.005–1.030)
pH: 7 (ref 5.0–8.0)

## 2017-01-03 LAB — BASIC METABOLIC PANEL
Anion gap: 7 (ref 5–15)
BUN: 18 mg/dL (ref 6–20)
CALCIUM: 9.4 mg/dL (ref 8.9–10.3)
CO2: 32 mmol/L (ref 22–32)
CREATININE: 1.13 mg/dL — AB (ref 0.44–1.00)
Chloride: 103 mmol/L (ref 101–111)
GFR, EST AFRICAN AMERICAN: 51 mL/min — AB (ref >60)
GFR, EST NON AFRICAN AMERICAN: 44 mL/min — AB (ref >60)
Glucose, Bld: 101 mg/dL — ABNORMAL HIGH (ref 65–99)
Potassium: 4.2 mmol/L (ref 3.5–5.1)
Sodium: 142 mmol/L (ref 135–145)

## 2017-01-03 MED ORDER — FUROSEMIDE 10 MG/ML IJ SOLN: 40.0000 mg | Freq: Every day | INTRAMUSCULAR | Status: DC

## 2017-01-03 MED ORDER — POLYETHYLENE GLYCOL 3350 17 G PO PACK
17.0000 g | PACK | Freq: Every day | ORAL | Status: DC
  Filled 2017-01-03: qty 1

## 2017-01-03 MED ORDER — HYDRALAZINE HCL 10 MG PO TABS
10.0000 mg | ORAL_TABLET | Freq: Three times a day (TID) | ORAL | Status: DC
  Filled 2017-01-03 (×2): qty 1

## 2017-01-03 MED ORDER — ISOSORBIDE MONONITRATE ER 30 MG PO TB24
30.0000 mg | ORAL_TABLET | Freq: Every day | ORAL | Status: DC
  Administered 2017-01-03 – 2017-01-04 (×2): 30 mg via ORAL
  Filled 2017-01-03 (×2): qty 1

## 2017-01-03 MED ORDER — SENNOSIDES-DOCUSATE SODIUM 8.6-50 MG PO TABS
1.0000 | ORAL_TABLET | Freq: Two times a day (BID) | ORAL | Status: DC
  Administered 2017-01-03 (×2): 1 via ORAL
  Filled 2017-01-03 (×2): qty 1

## 2017-01-03 MED ORDER — TORSEMIDE 20 MG PO TABS
80.0000 mg | ORAL_TABLET | Freq: Every day | ORAL | Status: DC
  Administered 2017-01-03 – 2017-01-04 (×2): 80 mg via ORAL
  Filled 2017-01-03 (×2): qty 4

## 2017-01-03 NOTE — Progress Notes (Signed)
Pharmacist Heart Failure Core Measure Documentation  Assessment: Bianca [name] has an EF documented as 20-25% on 7/18 by ECHO.  Rationale: Heart failure patients with left ventricular systolic dysfunction (LVSD) and an EF < 40% should be prescribed an angiotensin converting enzyme inhibitor (ACEI) or angiotensin receptor blocker (ARB) at discharge unless a contraindication is documented in the medical record.  This patient is not currently on an ACEI or ARB for HF.  This note is being placed in the record in order to provide documentation that a contraindication to the use of these agents is present for this encounter.  ACE Inhibitor or Angiotensin Receptor Blocker is contraindicated (specify all that apply)  [x]   ACEI allergy AND ARB allergy []   Angioedema []   Moderate or severe aortic stenosis []   Hyperkalemia []   Hypotension []   Renal artery stenosis [x]   Worsening renal function, preexisting renal disease or dysfunction  Jalene Mullet, Pharm.D. PGY1 Pharmacy Resident 01/03/2017 9:56 AM Main Pharmacy: [phone]

## 2017-01-03 NOTE — Consult Note (Signed)
Cardiology Consultation:   Patient ID: Bianca [name]; [id number]; 05-Dec-1932   Admit date: 01/01/2017 Date of Consult: 01/03/2017  Primary Care Provider: Maurice Small, MD Primary Cardiologist: Dr. Marlou Porch Primary Electrophysiologist: Dr. Lovena Le  Patient Profile:   Bianca [name] is a 83 y.o. female with a hx of ICM, chronic systolic CHF, LBBB, s/p BiV ICD implant.  Hx of VT, CAD  Chronic anticoagulation on Eliquis for PAF, CKD-3, hx of EF 35%that improved to 50-55% in 10/2015, who is being seen today for the evaluation of acute on chronic diastolic HF and on echo EF to 20% at the request of Dr. Eliseo Squires.  History of Present Illness:   [name] a hx of ICM, chronic systolic CHF, LBBB, s/p BiV ICD implant.  Hx of VT, CAD  Chronic anticoagulation on Eliquis for PAF, sleep apnea on CPAP, CKD-3,  And hx of EF 25-30% in 2008 that improved to 50-55% in 10/2015, who is being seen today for the evaluation of acute on chronic diastolic HF and with  echo this admit an EF of 20%.  Hx of cath 2008 for decreased LV systolic function.  No CAD except for minor irregularities. EF at that time was 25-30%.  Improved with BIV.  On last device check in June thoracic impedence was normal.  BiV pacing at 98.3%  Pt presented to ER by EMS after increasing SOB over 2 days prior to on day of admit 08/04/16 at rest.  BNP prior to admit from PCP office was 1500.  She was also more sleepy after nortriptyline increased and neurontin decreased.  On the am of the 17th she took extra spironolactone.    In ER BP 107/68, P 86 and resp 27 with sp02 98 % on 2L 02.     EKG SR with BiV pacing and no acute changes  I personally reviewed Telemetry:  Telemetry was personally reviewed and demonstrates: SR AV pacing, ? VT I personally reviewed  CMP with Cr 1.10 K+ 4.1, LFts normal.  BNP was 975 CBC was normal with WBC 8.8 H/H 12.4/38.5 CXR no acute pulmonary disease, stable cardiomegaly  Follow up CXR without any changes  ECHO  with decrease in EF to 20-25%, diffuse hypokinesis, G2DD, moderate concentric hypertrophy.  LA severely dilated, mod TR moderate MR, PA pk pressure 46 mmHg, PA pk pressure 46 mmHg moderate pulmonary HTN.    Currently she just finished walking up the hall and without SOB.  She had an episode of brief chest pain a week or so ago.  Also with viral syndrome with cough about 2 weeks ago.  She feels better with diuresing neg. 1892 cc since admit and wt is down 4 lbs.  Now on lasix 40 mg IV daily.   Cr now 1.13, u/a + nitrites and leukocytes.  No changes in diet or meds.  Past Medical History:  Diagnosis Date  . Acute blood loss anemia   . Arthritis   . CAD (coronary artery disease)/LBBB 01/01/2017  . Chronic systolic heart failure ([place])   . CKD (chronic kidney disease), stage III 12/22/2015  . Depression 11/24/2015  . Hypertension   . Hypokalemia   . Myocardial infarction ([facility]) 2008  . Neuromuscular disorder ([place])   . Paroxysmal atrial fibrillation ([facility])   . Physical deconditioning   . Right flank hematoma   . Sleep apnea     Past Surgical History:  Procedure Laterality Date  . ABDOMINAL HYSTERECTOMY    . ANKLE SURGERY    .  CARDIAC CATHETERIZATION    . CHOLECYSTECTOMY    . EP IMPLANTABLE DEVICE N/A 02/29/2016   Procedure: BIV ICD Generator Changeout;  Surgeon: Evans Lance, MD;  Location: [facility];  Service: Cardiovascular;  Laterality: N/A;  . EYE SURGERY    . INSERT / REPLACE / REMOVE PACEMAKER  2008  . KNEE LIGAMENT RECONSTRUCTION  2012  . TONSILLECTOMY       Inpatient Medications: Scheduled Meds: . allopurinol  400 mg Oral Daily  . apixaban  5 mg Oral BID  . atorvastatin  10 mg Oral q1800  . bisoprolol  2.5 mg Oral QHS  . calcium-vitamin D  1 tablet Oral Q breakfast  . cholecalciferol  1,000 Units Oral Daily  . colestipol  2 g Oral QHS  . [START ON 01/04/2017] furosemide  40 mg Intravenous Daily  . gabapentin  800 mg Oral TID  . nortriptyline  25 mg Oral QHS  .  polyethylene glycol  17 g Oral Daily  . potassium chloride SA  40 mEq Oral BID  . senna-docusate  1 tablet Oral BID  . sodium chloride flush  3 mL Intravenous Q12H   Continuous Infusions: . sodium chloride     PRN Meds: sodium chloride, acetaminophen, ondansetron (ZOFRAN) IV, senna-docusate, sodium chloride flush  Allergies:    Allergies  Allergen Reactions  . Quinapril Hcl Swelling    Tongue and throat  . Tape Other (See Comments)    Plastic tape causes irritation.   Lavella Lemons [Benzonatate] Swelling and Other (See Comments)    Capsule opened in mouth, not an allergy  . Neosporin [Neomycin-Polymyxin-Gramicidin] Rash    Social History:   Social History   Social History  . Marital status: Widowed    Spouse name: N/A  . Number of children: N/A  . Years of education: N/A   Occupational History  . Not on file.   Social History Main Topics  . Smoking status: Former Research scientist (life sciences)  . Smokeless tobacco: Former Systems developer  . Alcohol use No  . Drug use: No  . Sexual activity: Not Currently   Other Topics Concern  . Not on file   Social History Narrative  . No narrative on file    Family History:   The patient's family history includes Cancer in her brother; Congestive Heart Failure in her mother; Diabetes in her brother; Heart attack in her son; Stroke in her brother and mother; Tuberculosis in her father.  ROS:  Please see the history of present illness.  ROS  General: + viral syndrome 2 weeks ago no fevers, + weight increase up from 226 lbs in feb 2018 Skin:no rashes or ulcers HEENT:no blurred vision, no congestion CV:see HPI PUL:see HPI GI:no diarrhea constipation or melena, no indigestion GU:no hematuria, no dysuria MS:no joint pain, no claudication Neuro:no syncope, no lightheadedness Endo:no diabetes, no thyroid disease   Physical Exam/Data:   Vitals:   01/02/17 1230 01/02/17 2001 01/02/17 2223 01/03/17 0520  BP: 110/78 112/78  (!) 130/99  Pulse: 74 88 80 76    Resp: 18 18 16 18   Temp: 98.4 F (36.9 C) 98.6 F (37 C)  98.5 F (36.9 C)  TempSrc: Oral Oral  Oral  SpO2: 100% 98% 99% 98%  Weight:    225 lb 1.6 oz (102.1 kg)  Height:        Intake/Output Summary (Last 24 hours) at 01/03/17 1417 Last data filed at 01/03/17 1115  Gross per 24 hour  Intake  480 ml  Output              700 ml  Net             -220 ml   Filed Weights   01/01/17 1812 01/02/17 0602 01/03/17 0520  Weight: 229 lb 1.6 oz (103.9 kg) 226 lb 4.8 oz (102.6 kg) 225 lb 1.6 oz (102.1 kg)   Body mass index is 36.33 kg/m.  General:  Well nourished, well developed, in no acute distress HEENT: normal Lymph: no adenopathy Neck: no JVD Endocrine:  No thryomegaly Vascular: No carotid bruits; 1+ pedal pulses bil, Cardiac:  normal S1, S2; RRR; no murmur gallup rub or click Lungs: bil. Breath sounds to auscultation bilaterally, no wheezing, rhonchi few fine rales in bases Abd: soft, nontender, no hepatomegaly  Ext: no edema, + varacosities Musculoskeletal:  No deformities, BUE and BLE strength normal and equal Skin: warm and dry  Neuro:  A&O X 3 MAE follows commands, + facial symmetry  Psych:  Normal affect     Relevant CV Studies: Echo 01/02/17 Study Conclusions  - Left ventricle: The cavity size was severely dilated. There was   moderate concentric hypertrophy. Systolic function was severely   reduced. The estimated ejection fraction was in the range of 20%   to 25%. Diffuse hypokinesis. Features are consistent with a   pseudonormal left ventricular filling pattern, with concomitant   abnormal relaxation and increased filling pressure (grade 2   diastolic dysfunction). Doppler parameters are consistent with   elevated ventricular end-diastolic filling pressure. - Ventricular septum: Septal motion showed paradox. - Mitral valve: Calcified annulus. There was moderate   regurgitation. - Left atrium: The atrium was severely dilated. - Right ventricle:  Systolic function was normal. - Right atrium: The atrium was normal in size. - Tricuspid valve: There was moderate regurgitation. - Pulmonic valve: There was no regurgitation. - Pulmonary arteries: Systolic pressure was moderately increased.   PA peak pressure: 46 mm Hg (S). - Pericardium, extracardiac: There was no pericardial effusion.  Impressions:  - When compared to the prior study from 10/26/2015, LVEF has   decreased from 50-55% to 20-25%.   Filling pressures are significantly elevated.   Moderate MR and TR.   Moderate pulmonary hypertension.  ECHO 10/25/16 Study Conclusions  - Left ventricle: The cavity size was moderately dilated. There was   mild concentric hypertrophy. Systolic function was at the lower   limits of normal. The estimated ejection fraction was in the   range of 50% to 55%. Hypokinesis of the lateral and inferolateral   myocardium. Doppler parameters are consistent with abnormal left   ventricular relaxation (grade 1 diastolic dysfunction). Doppler   parameters are consistent with high ventricular filling pressure. - Aortic valve: Transvalvular velocity was within the normal range.   There was no stenosis. There was no regurgitation. - Mitral valve: Calcified annulus. Transvalvular velocity was   within the normal range. There was no evidence for stenosis.   There was mild regurgitation. - Left atrium: The atrium was severely dilated. - Right ventricle: The cavity size was normal. Wall thickness was   normal. Systolic function was normal. - Inferior vena cava: The vessel was normal in size. The   respirophasic diameter changes were in the normal range (>= 50%),   consistent with normal central venous pressure.  Laboratory Data:  Chemistry Recent Labs Lab 01/01/17 1424 01/02/17 0418 01/03/17 0400  NA 140 141 142  K 4.1 3.7 4.2  CL 104  101 103  CO2 27 30 32  GLUCOSE 109* 108* 101*  BUN 17 18 18   CREATININE 1.10* 1.17* 1.13*  CALCIUM 9.4  9.1 9.4  GFRNONAA 45* 42* 44*  GFRAA 53* 49* 51*  ANIONGAP 9 10 7      Recent Labs Lab 01/01/17 1424  PROT 7.0  ALBUMIN 4.1  AST 20  ALT 14  ALKPHOS 69  BILITOT 1.0   Hematology Recent Labs Lab 01/01/17 1339  WBC 8.8  RBC 3.99  HGB 12.4  HCT 38.5  MCV 96.5  MCH 31.1  MCHC 32.2  RDW 15.1  PLT 190   Cardiac EnzymesNo results for input(s): TROPONINI in the last 168 hours.  Recent Labs Lab 01/01/17 1345  TROPIPOC 0.03    BNP Recent Labs Lab 01/01/17 1339  BNP 975.5*    DDimer No results for input(s): DDIMER in the last 168 hours.  Radiology/Studies:  Dg Chest 2 View  Result Date: 01/01/2017 CLINICAL DATA:  Shortness of breath. EXAM: CHEST  2 VIEW COMPARISON:  07/09/2016. FINDINGS: Cardiac pacer with lead tips in right scratched it AICD no with lead tips in stable position. Cardiomegaly with normal pulmonary vascularity. No focal infiltrate. No pleural effusion or pneumothorax. IMPRESSION: 1. AICD in stable position.  Stable cardiomegaly. 2. No acute pulmonary disease. Electronically Signed   By: Marcello Moores  Register   On: 01/01/2017 15:14   Dg Chest Port 1 View  Result Date: 01/02/2017 CLINICAL DATA:  Cough EXAM: PORTABLE CHEST 1 VIEW COMPARISON:  01/01/2017 FINDINGS: Cardiac shadow remains enlarged. A defibrillator is again seen and stable. The lungs are well aerated bilaterally. No focal infiltrate or sizable effusion is seen. No bony abnormality is noted. IMPRESSION: Chronic changes without acute abnormality. Electronically Signed   By: Inez Catalina M.D.   On: 01/02/2017 08:23    Assessment and Plan:   1. Acute on chronic systolic HF now improved with diuresing.  Ambulated today with assistance.  Wt is back to baseline. Will  place back on home torsemide and stop IV lasix.  Will get out pt nuc 2. Cardiomyopathy NICM with hx of EF 25-30% in 2008 leading to [facility] and she did respond wit improvement in EF to 55% now down to 20-25%, will check BIVICD for arrhthymias  and percentage of BiV pacing.   No ACE/ARB due to angioedema with ACE, will add hydralazine and imdur.  PAF on Eliquis --check devise for paf  CHA2DS2VASc is 5 3. Hx BiVICD new battery and 2017, followed by Dr. Lovena Le will check  4. HLD on lipitor continue    Signed, [name], NP  01/03/2017 2:17 PM   History and all data above reviewed.  Patient examined.  I agree with the findings as above.  The patient presented with increased somnolence.  She is found to have a newly reduced EF.  She has not had chest pain.  She denies new PND or orthopnea and has not been particularly SOB.  Her weights have been stable.   The patient exam reveals COR:RRR  ,  Lungs: Clear  ,  Abd: Positive bowel sounds, no rebound no guarding, Ext No edema  .  All available labs, radiology testing, previous records reviewed. Agree with documented assessment and plan. ACUTE SYSTOLIC HF:  She seems to be relative euvolemic.  However, her EF is newly low without clear etiology.  She can be converted back to PO diuretic.  She we will start hydralazine/nitrates (allergic to ACE) and she probably has room for titration  of beta blocker and spironolactone slowly over time.  This can be accomplished as an out patient.  She can also have interrogation of her device and optimization of biventricular pacing as an outpatient as well.    Jeneen Rinks Maddox Bratcher  5:27 PM  01/03/2017

## 2017-01-03 NOTE — Progress Notes (Signed)
optivol on biv check was up slightly  No a fib, no Vtach, and biV pacing 98% of time

## 2017-01-03 NOTE — Progress Notes (Signed)
PROGRESS NOTE    Bianca [name]  ASN:[id number] DOB: 11-06-1932 DOA: 01/01/2017 PCP: Maurice Small, MD   Outpatient Specialists:     Brief Narrative:  Bianca [name] is a 83 y.o. female who presents with acute on chronic diastolic congestive heart failure. Found to have increasing cardiomegaly w/ mild pulmonary vascular congestion.  Echo shows new EF of 20%   Assessment & Plan:   Principal Problem:   Acute diastolic heart failure (HCC) Active Problems:   Hypertension   Sleep apnea   CAD (coronary artery disease)/LBBB   History of ventricular tachycardia   PAF (paroxysmal atrial fibrillation) (HCC)   CKD (chronic kidney disease) stage 3, GFR 30-59 ml/min   Peripheral neuropathy   OSA on CPAP   Obesity (BMI 30-39.9)   ICD (implantable cardioverter-defibrillator) in place   Acute diastolic heart failure combined with acute systolic heart failure -Given Lasix- down 2L -Hold spironolactone and bisoprolol -Not on ACE inhibitor secondary to allergy - OptiVol interrogation shows increasing volume -Last echocardiogram 2017: EF 50-55% with grade 1 diastolic dysfunction: new echo with EF of 20-25%- cards consult -Daily weights and strict I/O- last few weights per cards 224-228    ICD (implantable cardioverter-defibrillator) in place    Hypertension -monitor- add agents as needed    CAD (coronary artery disease)/LBBB -Currently asymptomatic- denies chest pain    CKD (chronic kidney disease) stage 3, GFR 30-59 ml/min -Renal function stable and near baseline -Follow closely with diuresis    Sleep apnea -Continue nocturnal CPAP    History of ventricular tachycardia -See above regarding ICD    PAF (paroxysmal atrial fibrillation)  -Beta blocker on hold as above -Currently maintaining sinus rhythm -Continue preadmission eliquis -[id number] =6    Peripheral neuropathy -Resume home meds  Constipation -bowel regimen ordered    Obesity (BMI 30-39.9)   DVT  prophylaxis:  Fully anticoagulated   Code Status: Full Code   Family Communication: daughter at bedside  Disposition Plan:     Consultants:   Cards     Subjective: Breathing better No chest pain Urine strong smelling this AM but no dysuria  Objective: Vitals:   01/02/17 1230 01/02/17 2001 01/02/17 2223 01/03/17 0520  BP: 110/78 112/78  (!) 130/99  Pulse: 74 88 80 76  Resp: 18 18 16 18   Temp: 98.4 F (36.9 C) 98.6 F (37 C)  98.5 F (36.9 C)  TempSrc: Oral Oral  Oral  SpO2: 100% 98% 99% 98%  Weight:    102.1 kg (225 lb 1.6 oz)  Height:        Intake/Output Summary (Last 24 hours) at 01/03/17 1211 Last data filed at 01/03/17 1115  Gross per 24 hour  Intake              720 ml  Output             1001 ml  Net             -281 ml   Filed Weights   01/01/17 1812 01/02/17 0602 01/03/17 0520  Weight: 103.9 kg (229 lb 1.6 oz) 102.6 kg (226 lb 4.8 oz) 102.1 kg (225 lb 1.6 oz)    Examination:  General exam: up in chair, NAD- talking with daughter Respiratory system: clear, no wheezing, no increased work of breathing Cardiovascular system: min LE edema, rrr Gastrointestinal system: +BS, soft Central nervous system: Alert and oriented. No focal neurological deficits. Extremities: Symmetric 5 x 5 power. Skin: No rashes, lesions or ulcers Psychiatry:  Judgement and insight appear normal. Mood & affect appropriate- mildly hard of hearing.     Data Reviewed: I have personally reviewed following labs and imaging studies  CBC:  Recent Labs Lab 01/01/17 1339  WBC 8.8  NEUTROABS 4.9  HGB 12.4  HCT 38.5  MCV 96.5  PLT 106   Basic Metabolic Panel:  Recent Labs Lab 01/01/17 1424 01/02/17 0418 01/03/17 0400  NA 140 141 142  K 4.1 3.7 4.2  CL 104 101 103  CO2 27 30 32  GLUCOSE 109* 108* 101*  BUN 17 18 18   CREATININE 1.10* 1.17* 1.13*  CALCIUM 9.4 9.1 9.4   GFR: Estimated Creatinine Clearance: 46.3 mL/min (A) (by C-G formula based on SCr of 1.13  mg/dL (H)). Liver Function Tests:  Recent Labs Lab 01/01/17 1424  AST 20  ALT 14  ALKPHOS 69  BILITOT 1.0  PROT 7.0  ALBUMIN 4.1   No results for input(s): LIPASE, AMYLASE in the last 168 hours. No results for input(s): AMMONIA in the last 168 hours. Coagulation Profile:  Recent Labs Lab 01/01/17 1424  INR 1.47   Cardiac Enzymes: No results for input(s): CKTOTAL, CKMB, CKMBINDEX, TROPONINI in the last 168 hours. BNP (last 3 results) No results for input(s): PROBNP in the last 8760 hours. HbA1C: No results for input(s): HGBA1C in the last 72 hours. CBG: No results for input(s): GLUCAP in the last 168 hours. Lipid Profile: No results for input(s): CHOL, HDL, LDLCALC, TRIG, CHOLHDL, LDLDIRECT in the last 72 hours. Thyroid Function Tests: No results for input(s): TSH, T4TOTAL, FREET4, T3FREE, THYROIDAB in the last 72 hours. Anemia Panel: No results for input(s): VITAMINB12, FOLATE, FERRITIN, TIBC, IRON, RETICCTPCT in the last 72 hours. Urine analysis:    Component Value Date/Time   COLORURINE YELLOW 01/01/2016 0741   APPEARANCEUR CLEAR 01/01/2016 0741   LABSPEC 1.010 01/01/2016 0741   PHURINE 6.5 01/01/2016 0741   GLUCOSEU NEGATIVE 01/01/2016 0741   HGBUR NEGATIVE 01/01/2016 0741   BILIRUBINUR NEGATIVE 01/01/2016 0741   KETONESUR NEGATIVE 01/01/2016 0741   PROTEINUR NEGATIVE 01/01/2016 0741   UROBILINOGEN 0.2 11/21/2012 1750   NITRITE NEGATIVE 01/01/2016 0741   LEUKOCYTESUR TRACE (A) 01/01/2016 0741     )No results found for this or any previous visit (from the past 240 hour(s)).    Anti-infectives    None       Radiology Studies: Dg Chest 2 View  Result Date: 01/01/2017 CLINICAL DATA:  Shortness of breath. EXAM: CHEST  2 VIEW COMPARISON:  07/09/2016. FINDINGS: Cardiac pacer with lead tips in right scratched it AICD no with lead tips in stable position. Cardiomegaly with normal pulmonary vascularity. No focal infiltrate. No pleural effusion or  pneumothorax. IMPRESSION: 1. AICD in stable position.  Stable cardiomegaly. 2. No acute pulmonary disease. Electronically Signed   By: Marcello Moores  Register   On: 01/01/2017 15:14   Dg Chest Port 1 View  Result Date: 01/02/2017 CLINICAL DATA:  Cough EXAM: PORTABLE CHEST 1 VIEW COMPARISON:  01/01/2017 FINDINGS: Cardiac shadow remains enlarged. A defibrillator is again seen and stable. The lungs are well aerated bilaterally. No focal infiltrate or sizable effusion is seen. No bony abnormality is noted. IMPRESSION: Chronic changes without acute abnormality. Electronically Signed   By: Inez Catalina M.D.   On: 01/02/2017 08:23        Scheduled Meds: . allopurinol  400 mg Oral Daily  . apixaban  5 mg Oral BID  . atorvastatin  10 mg Oral q1800  . bisoprolol  2.5 mg Oral QHS  . calcium-vitamin D  1 tablet Oral Q breakfast  . cholecalciferol  1,000 Units Oral Daily  . colestipol  2 g Oral QHS  . [START ON 01/04/2017] furosemide  40 mg Intravenous Daily  . gabapentin  800 mg Oral TID  . nortriptyline  25 mg Oral QHS  . polyethylene glycol  17 g Oral Daily  . potassium chloride SA  40 mEq Oral BID  . senna-docusate  1 tablet Oral BID  . sodium chloride flush  3 mL Intravenous Q12H   Continuous Infusions: . sodium chloride       LOS: 1 day    Time spent: 25 min    Roberts, DO [facility] Pager [phone]  If 7PM-7AM, please contact night-coverage [email or web address] Password [email or web address] 01/03/2017, 12:11 PM

## 2017-01-04 ENCOUNTER — Telehealth: Payer: Self-pay

## 2017-01-04 DIAGNOSIS — I5041 Acute combined systolic (congestive) and diastolic (congestive) heart failure: Secondary | ICD-10-CM

## 2017-01-04 DIAGNOSIS — I480 Paroxysmal atrial fibrillation: Secondary | ICD-10-CM

## 2017-01-04 DIAGNOSIS — Z8679 Personal history of other diseases of the circulatory system: Secondary | ICD-10-CM

## 2017-01-04 LAB — CBC
HCT: 39.2 % (ref 36.0–46.0)
Hemoglobin: 12.1 g/dL (ref 12.0–15.0)
MCH: 30.4 pg (ref 26.0–34.0)
MCHC: 30.9 g/dL (ref 30.0–36.0)
MCV: 98.5 fL (ref 78.0–100.0)
PLATELETS: 197 10*3/uL (ref 150–400)
RBC: 3.98 MIL/uL (ref 3.87–5.11)
RDW: 14.7 % (ref 11.5–15.5)
WBC: 8.1 10*3/uL (ref 4.0–10.5)

## 2017-01-04 LAB — BASIC METABOLIC PANEL
Anion gap: 8 (ref 5–15)
BUN: 16 mg/dL (ref 6–20)
CHLORIDE: 101 mmol/L (ref 101–111)
CO2: 30 mmol/L (ref 22–32)
CREATININE: 1.09 mg/dL — AB (ref 0.44–1.00)
Calcium: 9.1 mg/dL (ref 8.9–10.3)
GFR calc non Af Amer: 46 mL/min — ABNORMAL LOW (ref >60)
GFR, EST AFRICAN AMERICAN: 53 mL/min — AB (ref >60)
Glucose, Bld: 116 mg/dL — ABNORMAL HIGH (ref 65–99)
Potassium: 4 mmol/L (ref 3.5–5.1)
Sodium: 139 mmol/L (ref 135–145)

## 2017-01-04 MED ORDER — ISOSORBIDE MONONITRATE ER 30 MG PO TB24: 30.0000 mg | ORAL_TABLET | Freq: Every day | ORAL | 0 refills | Status: DC

## 2017-01-04 MED ORDER — HYDRALAZINE HCL 10 MG PO TABS: 10.0000 mg | ORAL_TABLET | Freq: Three times a day (TID) | ORAL | 0 refills | Status: DC

## 2017-01-04 NOTE — Progress Notes (Signed)
Call placed to C[facility] to notify of telemetry monitoring d/c.   

## 2017-01-04 NOTE — Telephone Encounter (Signed)
Outreach made to Pt for TCM f/u.  Call went to VM.  Left detailed message per DPR.  Stress test appts scheduled.  Pt needed the 2 part myocardial perfusion test.  1st part scheduled for Tues 01/08/2017 @ 1245. 2nd part scheduled for Thurs 01/10/2017 @ 1245.  Pt scheduled for TCM appt with Tommye Standard 01/08/2017 @ 1430 at [facility] office.  Left this nurse name and # for call back.  Pt discharging from [facility] today 01/04/2017.  Will attempt another outreach.

## 2017-01-04 NOTE — Progress Notes (Signed)
Patient stable, sleeping with CPAP.

## 2017-01-04 NOTE — Care Management Note (Signed)
Case Management Note  Patient Details  Name: Bianca [name] MRN: [id number] Date of Birth: [birth year]  Subjective/Objective:   CHF                Action/Plan: Patient lives at an [facility]; PCP: Maurice Small, MD; has private insurance with [employer]; pharmacy of choice is [facility]; patient does not want any HHC services at this time, she stated that where she lives they have a physical therapy department and she goes there at times; DME- walker at home.  Expected Discharge Date:  01/04/17               Expected Discharge Plan:  [place]  In-House Referral:   [facility]  Discharge planning Services  CM Consult   HH Arranged:   Refused     Status of Service:  In process, will continue to follow  [name] [phone] 01/04/2017, 10:42 AM

## 2017-01-04 NOTE — Progress Notes (Signed)
Hydralazine has yet to be administered due to low BP, and receipt of imdur/diuretics. systolics ranging from 68-127 without.  Maud Deed Tobias-Diakun,RN 01/04/17 11:16 AM   Sticky note left in chart for Physician

## 2017-01-04 NOTE — Telephone Encounter (Signed)
-----   Message from Isaiah Serge, NP sent at 01/03/2017  6:56 PM EDT ----- Pt needs lexiscan myoview for new cardiomyopathy order is in.  Would like next week.  She also needs appt with Dr. Lovena Le for BiV ICD eval with new drop in EF.  Possible synchronization of device.    Needs follow up in 5-7 days with Dr. Lovena Le or APP.  TCM

## 2017-01-04 NOTE — Consult Note (Signed)
   [facility] Inpatient Consult   01/04/2017  Bianca [name] Jan 02, 1933 [id number]   Patient follow up with [facility] services for post hospital follow up with the [place] beneficiary.  Patient is an 83 year old admitted with HX of chronic systolic HF with ICD  EF 54%.  Met with the patient and her daughter [name] at the bedside to explain [facility] CM role in post hospital follow up.  Noted that the inpatient states the patient had declined home health but the patient was assigned for HF EMMI calls.  Patient and daughter states they felt there were no current needs for home visits.  Explained automated calls and how [facility] CM nurse would follow up at first telephonically,  if the patient were having symptoms.  They verbalized understanding.  A brochure, flyer with EMMI HF calls information and    24 hour nurse advise line magnet given and explained. Patient and daughter declined home visit but accepted the EMMI HF follow up calls.  For questions, please cntact:    Natividad Brood, RN BSN [place] [facility] Liaison  [phone] business mobile phone Toll free office [phone]

## 2017-01-04 NOTE — Discharge Summary (Addendum)
Physician Discharge Summary  Bianca [name] IWP:[id number] DOB: 07/21/1932 DOA: 01/01/2017  PCP: Maurice Small, MD  Admit date: 01/01/2017 Discharge date: 01/04/2017  Admitted From:[facility] Disposition:  Independent Living at [facility]  Recommendations for Outpatient Follow-up:  1. Follow up with PCP in 1-2 weeks 2. Please obtain BMP/CBC in one week 3. Cardiology office will call with the follow up apointment   Home Health:yes Equipment/Devices:no Discharge Condition:stable CODE STATUS:full code Diet recommendation:heart healthy  Brief/Interim Summary:83 year old female with history of coronary artery disease, ICD, paroxysmal A. fib on anticoagulation, sleep apnea on CPAP and 2 L of oxygen as needed, peripheral neuropathy presented with shortness of breath.  Acute combined systolic and diastolic congestive heart failure: Drop in ejection fraction to 20-25%. Evaluated by cardiologist. Patient was treated with diuretics with improvement in her symptoms. Resume to home dose of torsemide and he started on Imdur. Continued on bisoprolol. Patient unable to tolerate hydralazine because of borderline low blood pressure. Clinically improved. Denied chest pain or shortness of breath. Discussed with cardiologist Dr. Percival Spanish, who recommended to discontinue hydralazine and continue other current cardiac medications on discharge. Contacted cardiology scheduling regarding follow-up. The clinic will call the patient with outpatient follow-up for Lexiscan and evaluation of ICD as an outpatient. Discussed with the patient and her daughter at bedside. Verbalized understanding. Discharging to independent living facility in stable condition.  History of Hypertension: Borderline low blood pressure. Current cardiac medication. Sleep apnea: Continue CPAP and nocturnal oxygen History of ventricular tachycardia as ICD: Outpatient follow-up with the cardiologist Paroxysmal atrial  fibrillation: Continue beta blocker, anticoagulation. Resume other home medications.  Discharge Diagnoses:  Principal Problem:   Acute diastolic heart failure (HCC) Active Problems:   Hypertension   Sleep apnea   CAD (coronary artery disease)/LBBB   History of ventricular tachycardia   PAF (paroxysmal atrial fibrillation) (HCC)   CKD (chronic kidney disease) stage 3, GFR 30-59 ml/min   Peripheral neuropathy   OSA on CPAP   Obesity (BMI 30-39.9)   ICD (implantable cardioverter-defibrillator) in place    Discharge Instructions  Discharge Instructions    (HEART FAILURE PATIENTS) Call MD:  Anytime you have any of the following symptoms: 1) 3 pound weight gain in 24 hours or 5 pounds in 1 week 2) shortness of breath, with or without a dry hacking cough 3) swelling in the hands, feet or stomach 4) if you have to sleep on extra pillows at night in order to breathe.    Complete by:  As directed    Call MD for:  difficulty breathing, headache or visual disturbances    Complete by:  As directed    Call MD for:  extreme fatigue    Complete by:  As directed    Call MD for:  hives    Complete by:  As directed    Call MD for:  persistant dizziness or light-headedness    Complete by:  As directed    Call MD for:  persistant nausea and vomiting    Complete by:  As directed    Call MD for:  severe uncontrolled pain    Complete by:  As directed    Call MD for:  temperature >100.4    Complete by:  As directed    Diet - low sodium heart healthy    Complete by:  As directed    Increase activity slowly    Complete by:  As directed      Allergies as of 01/04/2017  Reactions   Quinapril Hcl Swelling   Tongue and throat   Tape Other (See Comments)   Plastic tape causes irritation.    Tessalon [benzonatate] Swelling, Other (See Comments)   Capsule opened in mouth, not an allergy   Neosporin [neomycin-polymyxin-gramicidin] Rash      Medication List    STOP taking these medications    guaiFENesin-codeine 100-10 MG/5ML syrup   spironolactone 25 MG tablet Commonly known as:  ALDACTONE     TAKE these medications   acetaminophen 325 MG tablet Commonly known as:  TYLENOL Take 650 mg by mouth 2 (two) times daily as needed for mild pain or headache.   allopurinol 300 MG tablet Commonly known as:  ZYLOPRIM Take 300 mg by mouth See admin instructions. Take with the 100mg  tablet to equal 400mg  daily   allopurinol 100 MG tablet Commonly known as:  ZYLOPRIM Take 100 mg by mouth See admin instructions. Takes along with a 300 mg tablet to equal 400 mg   atorvastatin 10 MG tablet Commonly known as:  LIPITOR Take 1 tablet (10 mg total) by mouth daily at 6 PM.   benzonatate 200 MG capsule Commonly known as:  TESSALON Take 200 mg by mouth 3 (three) times daily as needed for cough.   bisoprolol 5 MG tablet Commonly known as:  ZEBETA Take 0.5 tablets (2.5 mg total) by mouth at bedtime.   CALCIUM/VITAMIN D/MINERALS 600-200 MG-UNIT Tabs Take 1 tablet by mouth daily.   colestipol 1 g tablet Commonly known as:  COLESTID Take 2 g by mouth at bedtime.   ELIQUIS 5 MG Tabs tablet Generic drug:  apixaban TAKE 1 TABLET TWICE DAILY   gabapentin 800 MG tablet Commonly known as:  NEURONTIN Take 800 mg by mouth 3 (three) times daily.   guaiFENesin 600 MG 12 hr tablet Commonly known as:  MUCINEX Take 1 tablet (600 mg total) by mouth 2 (two) times daily.   isosorbide mononitrate 30 MG 24 hr tablet Commonly known as:  IMDUR Take 1 tablet (30 mg total) by mouth daily.   nitroGLYCERIN 0.4 MG SL tablet Commonly known as:  NITROSTAT Place 1 tablet (0.4 mg total) under the tongue every 5 (five) minutes as needed for chest pain.   nortriptyline 25 MG capsule Commonly known as:  PAMELOR Take 25 mg by mouth at bedtime.   OXYGEN Inhale 2 L/min into the lungs at bedtime as needed (uses as bedtime everynight and then occasionally iun the daytime as needed for SOB).   potassium  chloride SA 20 MEQ tablet Commonly known as:  K-DUR,KLOR-CON Take 2 tablets (40 mEq total) by mouth 2 (two) times daily.   senna-docusate 8.6-50 MG tablet Commonly known as:  Senokot-S Take 1 tablet by mouth daily as needed for mild constipation.   torsemide 20 MG tablet Commonly known as:  DEMADEX Take 4 tablets (80 mg total) by mouth daily. Take additional 40 mg (2 tablets) in the afternoon on Tuesday, Thursday and Saturday   Vitamin D3 1000 units Caps Take 1 capsule by mouth daily.      Follow-up Information    Maurice Small, MD. Schedule an appointment as soon as possible for a visit on 01/08/2017.   Specialty:  Family Medicine Why:  Follow up @ 11:30am Contact information: [address] [place] [address] [phone]        Jerline Pain, MD Follow up.   Specialty:  Cardiology Why:  the clinic will call you with the appontment.  Contact information: [address] [place] [address] [phone]          Allergies  Allergen Reactions  . Quinapril Hcl Swelling    Tongue and throat  . Tape Other (See Comments)    Plastic tape causes irritation.   Lavella Lemons [Benzonatate] Swelling and Other (See Comments)    Capsule opened in mouth, not an allergy  . Neosporin [Neomycin-Polymyxin-Gramicidin] Rash    Consultations: Cardiologist  Procedures/Studies: Echo  Subjective: Patient was seen and examined at bedside. Reported doing well. Denied headache, dizziness, nausea, vomiting, chest pain, shortness of breath. No abdominal pain.  At bedside.  Discharge Exam: Vitals:   01/03/17 2239 01/04/17 0615  BP: (!) 96/54 103/65  Pulse: 77 68  Resp:  19  Temp:  97.8 F (36.6 C)   Vitals:   01/03/17 2014 01/03/17 2200 01/03/17 2239 01/04/17 0615  BP: 105/65 (!) 96/54 (!) 96/54 103/65  Pulse: 86  77 68  Resp: 18   19  Temp: 97.6 F (36.4 C)   97.8 F (36.6 C)  TempSrc: Oral   Oral  SpO2: 98%   99%  Weight:     102.1 kg (225 lb)  Height:        General: Pt is alert, awake, not in acute distress, lying flat on bed and comfortable. Cardiovascular: RRR, S1/S2 +, no rubs, no gallops Respiratory: CTA bilaterally, no wheezing, no rhonchi Abdominal: Soft, NT, ND, bowel sounds + Extremities: no edema, no cyanosis Neurologic: Alert awake and following commands and oriented.   The results of significant diagnostics from this hospitalization (including imaging, microbiology, ancillary and laboratory) are listed below for reference.     Microbiology: No results found for this or any previous visit (from the past 240 hour(s)).   Labs: BNP (last 3 results)  Recent Labs  01/01/17 1339  BNP 563.8*   Basic Metabolic Panel:  Recent Labs Lab 01/01/17 1424 01/02/17 0418 01/03/17 0400 01/04/17 0400  NA 140 141 142 139  K 4.1 3.7 4.2 4.0  CL 104 101 103 101  CO2 27 30 32 30  GLUCOSE 109* 108* 101* 116*  BUN 17 18 18 16   CREATININE 1.10* 1.17* 1.13* 1.09*  CALCIUM 9.4 9.1 9.4 9.1   Liver Function Tests:  Recent Labs Lab 01/01/17 1424  AST 20  ALT 14  ALKPHOS 69  BILITOT 1.0  PROT 7.0  ALBUMIN 4.1   No results for input(s): LIPASE, AMYLASE in the last 168 hours. No results for input(s): AMMONIA in the last 168 hours. CBC:  Recent Labs Lab 01/01/17 1339 01/04/17 0400  WBC 8.8 8.1  NEUTROABS 4.9  --   HGB 12.4 12.1  HCT 38.5 39.2  MCV 96.5 98.5  PLT 190 197   Cardiac Enzymes: No results for input(s): CKTOTAL, CKMB, CKMBINDEX, TROPONINI in the last 168 hours. BNP: Invalid input(s): POCBNP CBG: No results for input(s): GLUCAP in the last 168 hours. D-Dimer No results for input(s): DDIMER in the last 72 hours. Hgb A1c No results for input(s): HGBA1C in the last 72 hours. Lipid Profile No results for input(s): CHOL, HDL, LDLCALC, TRIG, CHOLHDL, LDLDIRECT in the last 72 hours. Thyroid function studies No results for input(s): TSH, T4TOTAL, T3FREE, THYROIDAB in the last 72  hours.  Invalid input(s): FREET3 Anemia work up No results for input(s): VITAMINB12, FOLATE, FERRITIN, TIBC, IRON, RETICCTPCT in the last 72 hours. Urinalysis    Component Value Date/Time   COLORURINE YELLOW 01/03/2017 1421  APPEARANCEUR CLEAR 01/03/2017 1421   LABSPEC 1.006 01/03/2017 1421   PHURINE 7.0 01/03/2017 1421   GLUCOSEU NEGATIVE 01/03/2017 1421   HGBUR NEGATIVE 01/03/2017 1421   BILIRUBINUR NEGATIVE 01/03/2017 1421   KETONESUR NEGATIVE 01/03/2017 1421   PROTEINUR NEGATIVE 01/03/2017 1421   UROBILINOGEN 0.2 11/21/2012 1750   NITRITE POSITIVE (A) 01/03/2017 1421   LEUKOCYTESUR MODERATE (A) 01/03/2017 1421   Sepsis Labs Invalid input(s): PROCALCITONIN,  WBC,  LACTICIDVEN Microbiology No results found for this or any previous visit (from the past 240 hour(s)).   Time coordinating discharge: 35 minutes  SIGNED:   Rosita Fire, MD  [facility] 01/04/2017, 11:33 AM  If 7PM-7AM, please contact night-coverage [email or web address] Password [email or web address]

## 2017-01-04 NOTE — Progress Notes (Signed)
Progress Note  Patient Name: Bianca [name] Date of Encounter: 01/04/2017  Primary Cardiologist:   Dr. Marlou Porch  Subjective   Breathing OK.  No SOB.  No chest pain.   Inpatient Medications    Scheduled Meds: . allopurinol  400 mg Oral Daily  . apixaban  5 mg Oral BID  . atorvastatin  10 mg Oral q1800  . bisoprolol  2.5 mg Oral QHS  . calcium-vitamin D  1 tablet Oral Q breakfast  . cholecalciferol  1,000 Units Oral Daily  . colestipol  2 g Oral QHS  . gabapentin  800 mg Oral TID  . hydrALAZINE  10 mg Oral TID  . isosorbide mononitrate  30 mg Oral Daily  . nortriptyline  25 mg Oral QHS  . polyethylene glycol  17 g Oral Daily  . potassium chloride SA  40 mEq Oral BID  . senna-docusate  1 tablet Oral BID  . sodium chloride flush  3 mL Intravenous Q12H  . torsemide  80 mg Oral Daily   Continuous Infusions: . sodium chloride     PRN Meds: sodium chloride, acetaminophen, ondansetron (ZOFRAN) IV, senna-docusate, sodium chloride flush   Vital Signs    Vitals:   01/03/17 2014 01/03/17 2200 01/03/17 2239 01/04/17 0615  BP: 105/65 (!) 96/54 (!) 96/54 103/65  Pulse: 86  77 68  Resp: 18   19  Temp: 97.6 F (36.4 C)   97.8 F (36.6 C)  TempSrc: Oral   Oral  SpO2: 98%   99%  Weight:    225 lb (102.1 kg)  Height:        Intake/Output Summary (Last 24 hours) at 01/04/17 0949 Last data filed at 01/04/17 0801  Gross per 24 hour  Intake              960 ml  Output              900 ml  Net               60 ml   Filed Weights   01/02/17 0602 01/03/17 0520 01/04/17 0615  Weight: 226 lb 4.8 oz (102.6 kg) 225 lb 1.6 oz (102.1 kg) 225 lb (102.1 kg)    Telemetry    NSR - Personally Reviewed  ECG    NA - Personally Reviewed  Physical Exam   GEN: No acute distress.  Neck: No  JVD Cardiac: RRR, no murmurs, rubs, or gallops.  Respiratory: Clear  to auscultation bilaterally. GI: Soft, nontender, non-distended  MS: No  edema; No deformity. Neuro:  Nonfocal  Psych:  Normal affect   Labs    Chemistry Recent Labs Lab 01/01/17 1424 01/02/17 0418 01/03/17 0400 01/04/17 0400  NA 140 141 142 139  K 4.1 3.7 4.2 4.0  CL 104 101 103 101  CO2 27 30 32 30  GLUCOSE 109* 108* 101* 116*  BUN 17 18 18 16   CREATININE 1.10* 1.17* 1.13* 1.09*  CALCIUM 9.4 9.1 9.4 9.1  PROT 7.0  --   --   --   ALBUMIN 4.1  --   --   --   AST 20  --   --   --   ALT 14  --   --   --   ALKPHOS 69  --   --   --   BILITOT 1.0  --   --   --   GFRNONAA 45* 42* 44* 46*  GFRAA 53* 49* 51* 53*  ANIONGAP  9 10 7 8      Hematology Recent Labs Lab 01/01/17 1339 01/04/17 0400  WBC 8.8 8.1  RBC 3.99 3.98  HGB 12.4 12.1  HCT 38.5 39.2  MCV 96.5 98.5  MCH 31.1 30.4  MCHC 32.2 30.9  RDW 15.1 14.7  PLT 190 197    Cardiac EnzymesNo results for input(s): TROPONINI in the last 168 hours.  Recent Labs Lab 01/01/17 1345  TROPIPOC 0.03     BNP Recent Labs Lab 01/01/17 1339  BNP 975.5*     DDimer No results for input(s): DDIMER in the last 168 hours.   Radiology    No results found.  Cardiac Studies   ECHO:    - Left ventricle: The cavity size was severely dilated. There was   moderate concentric hypertrophy. Systolic function was severely   reduced. The estimated ejection fraction was in the range of 20%   to 25%. Diffuse hypokinesis. Features are consistent with a   pseudonormal left ventricular filling pattern, with concomitant   abnormal relaxation and increased filling pressure (grade 2   diastolic dysfunction). Doppler parameters are consistent with   elevated ventricular end-diastolic filling pressure. - Ventricular septum: Septal motion showed paradox. - Mitral valve: Calcified annulus. There was moderate   regurgitation. - Left atrium: The atrium was severely dilated. - Right ventricle: Systolic function was normal. - Right atrium: The atrium was normal in size. - Tricuspid valve: There was moderate regurgitation. - Pulmonic valve: There was no  regurgitation. - Pulmonary arteries: Systolic pressure was moderately increased.   PA peak pressure: 46 mm Hg (S). - Pericardium, extracardiac: There was no pericardial effusion.   Patient Profile     83 y.o. female with a hx of ICM, chronic systolic CHF, LBBB, s/p BiV ICD implant. Hx of VT, CAD Chronic anticoagulation on Eliquis for PAF, CKD-3, hx of EF 35% that improved to 50-55% in 10/2015, who is being seen for the evaluation of acute on chronic diastolic HF and on echo EF to 20% at the request of Dr. Eliseo Squires  Assessment & Plan    ACUTE SYSTOLIC HF:  Newly reduced EF.  We started hydral/nitrates yesterday because she cannot get ACE/ARB.   We will schedule an out patient Lexiscan Myoview.  Of note she did not get the hydralazine last night because of low BP .  Med titration will likely be difficult.    BiV ICD:  Device interrogation with mild decreased impedence suggesting mild increased volume.  She did get IV diuresis.  Plan is as above.   Signed, Minus Breeding, MD  01/04/2017, 9:49 AM

## 2017-01-04 NOTE — Progress Notes (Signed)
Pt d/c from unit via wheelchair, escorted by volunteer. Pts daughter meeting them at the car. Pt is c/a/o at baseline, will return to independent living via car with her daughter.

## 2017-01-07 ENCOUNTER — Telehealth (HOSPITAL_COMMUNITY): Payer: Self-pay | Admitting: *Deleted

## 2017-01-07 NOTE — Telephone Encounter (Signed)
Patient's daughter in law -delane, per DPR, given detailed instructions per Myocardial Perfusion Study Information Sheet for the test on 01/07/17. Patient notified to arrive 15 minutes early and that it is imperative to arrive on time for appointment to keep from having the test rescheduled.  If you need to cancel or reschedule your appointment, please call the office within 24 hours of your appointment. . Patient verbalized understanding. Kirstie Peri

## 2017-01-07 NOTE — Telephone Encounter (Signed)
Patient contacted regarding discharge from [facility] on 01/04/2017. Return call received from Pincus Large - daughter in law.  DPR on file.    Pt daughter understands to follow up with provider Tommye Standard on 01/08/2017 at 1430 at [facility] st office. Pt daughter understands discharge instructions? yes Pt daughter understands medications and regiment? yes Pt daughter understands to bring all medications to this visit? yes  Call back received from [facility], Pt's daughter in law.  [name] with questions about Pt scheduled myoview for this Tuesday and Thursday.  Spoke with clinic, per clinic they will be giving her a call today to provide instruction for test.  Notified [name] that she would get a call from staff today with addl instruction for test.  [name] thanked this nurse.  Per [name], Pt is doing well.  Most of her extra water is gone.  Denies further needs.

## 2017-01-08 ENCOUNTER — Ambulatory Visit (INDEPENDENT_AMBULATORY_CARE_PROVIDER_SITE_OTHER): Payer: PPO | Admitting: Physician Assistant

## 2017-01-08 ENCOUNTER — Encounter: Payer: PPO | Admitting: *Deleted

## 2017-01-08 ENCOUNTER — Other Ambulatory Visit: Payer: Self-pay

## 2017-01-08 ENCOUNTER — Ambulatory Visit (HOSPITAL_COMMUNITY): Payer: PPO | Attending: Cardiology

## 2017-01-08 VITALS — BP 126/68 | HR 84 | Ht 66.0 in | Wt 231.0 lb

## 2017-01-08 DIAGNOSIS — I5023 Acute on chronic systolic (congestive) heart failure: Secondary | ICD-10-CM

## 2017-01-08 DIAGNOSIS — I420 Dilated cardiomyopathy: Secondary | ICD-10-CM

## 2017-01-08 DIAGNOSIS — I480 Paroxysmal atrial fibrillation: Secondary | ICD-10-CM | POA: Diagnosis not present

## 2017-01-08 DIAGNOSIS — I5022 Chronic systolic (congestive) heart failure: Secondary | ICD-10-CM

## 2017-01-08 DIAGNOSIS — G4733 Obstructive sleep apnea (adult) (pediatric): Secondary | ICD-10-CM | POA: Diagnosis not present

## 2017-01-08 DIAGNOSIS — I5020 Unspecified systolic (congestive) heart failure: Secondary | ICD-10-CM

## 2017-01-08 DIAGNOSIS — I472 Ventricular tachycardia, unspecified: Secondary | ICD-10-CM

## 2017-01-08 DIAGNOSIS — I110 Hypertensive heart disease with heart failure: Secondary | ICD-10-CM

## 2017-01-08 MED ORDER — SPIRONOLACTONE 25 MG PO TABS: 12.5000 mg | ORAL_TABLET | Freq: Every day | ORAL | 11 refills | Status: DC

## 2017-01-08 MED ORDER — TECHNETIUM TC 99M TETROFOSMIN IV KIT
32.8000 | PACK | Freq: Once | INTRAVENOUS | Status: AC | PRN
  Administered 2017-01-08: 32.8 via INTRAVENOUS
  Filled 2017-01-08: qty 33

## 2017-01-08 MED ORDER — REGADENOSON 0.4 MG/5ML IV SOLN
0.4000 mg | Freq: Once | INTRAVENOUS | Status: AC
  Administered 2017-01-08: 0.4 mg via INTRAVENOUS

## 2017-01-08 NOTE — Progress Notes (Signed)
Per review of Pt discharge paperwork, Dr. Maurice Small requesting CBC and BMP for Pt.  Will draw today as Pt is here for stress test and Pt unable to keep PCP appt.  Will send results to Maurice Small.

## 2017-01-08 NOTE — Addendum Note (Signed)
Addended by: Willeen Cass A on: 01/08/2017 02:06 PM   Modules accepted: Orders

## 2017-01-08 NOTE — Progress Notes (Signed)
ICM remote transmission rescheduled to 01/18/2017 due to patient is having office defib check today with Tommye Standard, PA

## 2017-01-08 NOTE — Patient Instructions (Addendum)
Medication Instructions:   START BACK TAKING SPRINOLACTONE 12.5 MG ( HALF OF 25 MG) TABLET ONCE A DAY   If you need a refill on your cardiac medications before your next appointment, please call your pharmacy.  Labwork: NONE ORDERED  TODAY'   Testing/Procedures: NONE ORDERED  TODAY    Follow-Up: Your physician wants you to follow-up in: [place] will receive a reminder letter in the mail two months in advance. If you don't receive a letter, please call our office to schedule the follow-up appointment.  Remote monitoring is used to monitor your Pacemaker of ICD from home. This monitoring reduces the number of office visits required to check your device to one time per year. It allows [place] to keep an eye on the functioning of your device to ensure it is working properly. You are scheduled for a device check from home on . 04-10-17 You may send your transmission at any time that day. If you have a wireless device, the transmission will be sent automatically. After your physician reviews your transmission, you will receive a postcard with your next transmission date.     Any Other Special Instructions Will Be Listed Below (If Applicable).

## 2017-01-08 NOTE — Progress Notes (Signed)
Cardiology Office Note Date:  01/08/2017  Patient ID:  Bianca, [name] 17-Jun-1933, MRN [id number] PCP:  Maurice Small, MD  Cardiologist:  Dr. Marlou Porch Electrophysiologist: Dr. Lovena Le    Chief Complaint:  Post hospital  History of Present Illness: Bianca [name] is a 83 y.o. female with history of ICM, CAD is listed though notes refer to NOD by cath in 6812, chronic systolic CHF, LBBB, s/p BiV ICD implant. Hx of VT,  Chronic anticoagulation on Eliquis for PAF, CKD-3, sleep apnea on CPAP comes in today to be seen for Dr. Lovena Le.  Last seen by him in Dec, no changes were made to her tx.  More recently just discharged from [facility] after a CHF exacerbation on 01/04/17, noted her previously improved EF noted down again with plan of out patient follow up of stress testing and device check for any potential optimization of programming that may benefit the patient.  The patient was noted to have ACE allergy with angioedema and started on hydralazine though unable to tolerate BP-wise.  Maintained on BB, nitrate, diuretic.  She comes accompanied by her daughter in law, they report that they were very surprised with the CHF episode, seemed to come on suddenly to them describing sudden onset of SOB.  Though in discussion, seems shehad been having symptoms of an unusual cough only at night, likely was orthopnea, and for 2 weeks unusual daytime fatigue, falling asleep when she would sit for any period of time, very unusual for her.  This was attributed to a change in her neuropathy meds, and were resumed to her usual dosing by the PMD.  She had a a day of ankle edema that self resolved, she did see PMD who did labs though by exam the daughter in law states did not feel like she was obviously overloaded though called a few hours later and told her BNP was 1500 and instructed to take an extra 1/2 tab of spironolactone though by that time was feeling quite SOB and called EMS.  Since her hospital stay she has felt  well.  Reports her weight stable at home, her 1st AM weight there is 220 (including this AM) steady since discharge, and is what her baseline "dry weight" is felt to be. She denies any kind of CP no palpitations, no dizziness, near syncope or syncope.  She walks with a walker with significant peripheral neuropathy.  She had a fall last weekend losing her balance trying to close the toilet lid, denies syncope.  She denies truama other then cutting her fingers on the underside of the cabinet trying to get up.  No head trauma, no significant bleeding.  No bleeding otherwise, no signs of bleeding.  She had a stress test just prior to her visit, the results are pending.   Device information: MDT CRT-D implanted 2008, gen change 02/29/16, Dr. Lovena Le, primary prevention + appropriate tx for VT w/ successful ATP   Past Medical History:  Diagnosis Date  . Acute blood loss anemia   . Arthritis   . CAD (coronary artery disease)/LBBB 01/01/2017  . Chronic systolic heart failure ([place])   . CKD (chronic kidney disease), stage III 12/22/2015  . Depression 11/24/2015  . Hypertension   . Hypokalemia   . Myocardial infarction ([place]) 2008  . Neuromuscular disorder ([place])   . Paroxysmal atrial fibrillation ([facility])   . Physical deconditioning   . Right flank hematoma   . Sleep apnea     Past Surgical History:  Procedure  Laterality Date  . ABDOMINAL HYSTERECTOMY    . ANKLE SURGERY    . CARDIAC CATHETERIZATION    . CHOLECYSTECTOMY    . EP IMPLANTABLE DEVICE N/A 02/29/2016   Procedure: BIV ICD Generator Changeout;  Surgeon: Evans Lance, MD;  Location: [place] [facility];  Service: Cardiovascular;  Laterality: N/A;  . EYE SURGERY    . INSERT / REPLACE / REMOVE PACEMAKER  2008  . KNEE LIGAMENT RECONSTRUCTION  2012  . TONSILLECTOMY      Current Outpatient Prescriptions  Medication Sig Dispense Refill  . acetaminophen (TYLENOL) 325 MG tablet Take 650 mg by mouth 2 (two) times daily as needed for mild pain or  headache.     . allopurinol (ZYLOPRIM) 100 MG tablet Take 100 mg by mouth See admin instructions. Takes along with a 300 mg tablet to equal 400 mg     . allopurinol (ZYLOPRIM) 300 MG tablet Take 300 mg by mouth See admin instructions. Take with the 100mg  tablet to equal 400mg  daily     . atorvastatin (LIPITOR) 10 MG tablet Take 1 tablet (10 mg total) by mouth daily at 6 PM. 90 tablet 3  . benzonatate (TESSALON) 200 MG capsule Take 200 mg by mouth 3 (three) times daily as needed for cough.  1  . bisoprolol (ZEBETA) 5 MG tablet Take 0.5 tablets (2.5 mg total) by mouth at bedtime. 45 tablet 3  . Calcium Carbonate-Vit D-Min (CALCIUM/VITAMIN D/MINERALS) 600-200 MG-UNIT TABS Take 1 tablet by mouth daily.    . Cholecalciferol (VITAMIN D3) 1000 UNITS CAPS Take 1 capsule by mouth daily.    . colestipol (COLESTID) 1 G tablet Take 2 g by mouth at bedtime.     Marland Kitchen ELIQUIS 5 MG TABS tablet TAKE 1 TABLET TWICE DAILY 180 tablet 0  . gabapentin (NEURONTIN) 800 MG tablet Take 800 mg by mouth 3 (three) times daily.    Marland Kitchen guaiFENesin (MUCINEX) 600 MG 12 hr tablet Take 1 tablet (600 mg total) by mouth 2 (two) times daily.    . isosorbide mononitrate (IMDUR) 30 MG 24 hr tablet Take 1 tablet (30 mg total) by mouth daily. 30 tablet 0  . nitroGLYCERIN (NITROSTAT) 0.4 MG SL tablet Place 1 tablet (0.4 mg total) under the tongue every 5 (five) minutes as needed for chest pain. 25 tablet 6  . nortriptyline (PAMELOR) 25 MG capsule Take 25 mg by mouth at bedtime.    . OXYGEN Inhale 2 L/min into the lungs at bedtime as needed (uses as bedtime everynight and then occasionally iun the daytime as needed for SOB).     . potassium chloride SA (K-DUR,KLOR-CON) 20 MEQ tablet Take 2 tablets (40 mEq total) by mouth 2 (two) times daily. 360 tablet 3  . senna-docusate (SENOKOT-S) 8.6-50 MG tablet Take 1 tablet by mouth daily as needed for mild constipation.    . torsemide (DEMADEX) 20 MG tablet Take 4 tablets (80 mg total) by mouth daily.  Take additional 40 mg (2 tablets) in the afternoon on Tuesday, Thursday and Saturday 440 tablet 3   No current facility-administered medications for this visit.     Allergies:   Quinapril hcl; Tape; Tessalon [benzonatate]; and Neosporin [neomycin-polymyxin-gramicidin]   Social History:  The patient  reports that she has quit smoking. She has quit using smokeless tobacco. She reports that she does not drink alcohol or use drugs.   Family History:  The patient's family history includes Cancer in her brother; Congestive Heart Failure in her mother;  Diabetes in her brother; Heart attack in her son; Stroke in her brother and mother; Tuberculosis in her father.  ROS:  Please see the history of present illness.  All other systems are reviewed and otherwise negative.   PHYSICAL EXAM:  VS:  Ht 5\' 6"  (1.676 m)   LMP  (LMP Unknown)   BMI 36.32 kg/m  BMI: Body mass index is 36.32 kg/m. Well nourished, well developed, in no acute distress  HEENT: normocephalic, atraumatic  Neck: no JVD, carotid bruits or masses Cardiac:  RRR; 1/6 SM, no rubs, or gallops Lungs:  CTA b/l, no wheezing, rhonchi or rales  Abd: soft, nontender MS: no deformity or atrophy Ext: trace if any  edema  Skin: warm and dry, no rash Neuro:  No gross deficits appreciated Psych: euthymic mood, full affect  ICD site is stable, no tethering or discomfort   EKG:  Done 01/01/17  Is SR, V paced ICD interrogation done today and by industry and reviewed by myself: battery and lead measurements are good, 99.7% bive pacing, no observations  Echo 01/02/17 Study Conclusions - Left ventricle: The cavity size was severely dilated. There was moderate concentric hypertrophy. Systolic function was severely reduced. The estimated ejection fraction was in the range of 20% to 25%. Diffuse hypokinesis. Features are consistent with a pseudonormal left ventricular filling pattern, with concomitant abnormal relaxation and increased  filling pressure (grade 2 diastolic dysfunction). Doppler parameters are consistent with elevated ventricular end-diastolic filling pressure. - Ventricular septum: Septal motion showed paradox. - Mitral valve: Calcified annulus. There was moderate regurgitation. - Left atrium: The atrium was severely dilated. - Right ventricle: Systolic function was normal. - Right atrium: The atrium was normal in size. - Tricuspid valve: There was moderate regurgitation. - Pulmonic valve: There was no regurgitation. - Pulmonary arteries: Systolic pressure was moderately increased. PA peak pressure: 46 mm Hg (S). - Pericardium, extracardiac: There was no pericardial effusion. Impressions: - When compared to the prior study from 10/26/2015, LVEF has decreased from 50-55% to 20-25%. Filling pressures are significantly elevated. Moderate MR and TR. Moderate pulmonary hypertension.  10/26/15: TTE w/LVEF 50-55, +WMA 11/22/12: TTE, LVEF 20-25%, no WMA Notes report cath in 2008 with minor luminal irregularities  Recent Labs: 01/01/2017: ALT 14; B Natriuretic Peptide 975.5 01/04/2017: BUN 16; Creatinine, Ser 1.09; Hemoglobin 12.1; Platelets 197; Potassium 4.0; Sodium 139  No results found for requested labs within last 8760 hours.   Estimated Creatinine Clearance: 48 mL/min (A) (by C-G formula based on SCr of 1.09 mg/dL (H)).   Wt Readings from Last 3 Encounters:  01/08/17 225 lb (102.1 kg)  01/04/17 225 lb (102.1 kg)  12/20/16 226 lb (102.5 kg)     Other studies reviewed: Additional studies/records reviewed today include: summarized above  ASSESSMENT AND PLAN:  1. CRT-D      Intact function, no changes made, 99.7% BiVe pacing      No AF or VT  2. NICM w/ recent CHF exacerbation     Optivol suggests ongoing fluid OL, perhaps starting to flatten out     LVEF had improved and down again, cardiology plans w/ stress testing to evaluate further     Resume her Aldactone 12.5mg  daily      Dr. Marlou Porch discussed with the patient 1 L fluid daily restriction and salt avoidance     Her labs 7/20 looked OK, had labs drawn today as well  Likely her nighttime cough was the beginning and not such a sudden onset as thought,  discussed importance of recognizing this as a early symptom for her and to call if she notes this in effort to get ahead CHF exacerbations sooner if possible    3. HTN     Looks OK, room for aldactone resumption  4. PAFib     CHA2DS2Vasc is at least 4 on Eliquis (appropritaely dosed at 5mg )  Disposition: F/u with ICM transmission as scheduled to follow fluid status as well as cardiology APP appointment 01/21/17, pending her stress test and lab results.  From EP standpoint, 3 month remote device check and 1 year with dr. Lovena Le, sooner if needed.  Current medicines are reviewed at length with the patient today.  The patient did not have any concerns regarding medicines.  Haywood Lasso, PA-C 01/08/2017 2:47 PM     [name] [place] [address] [phone] (office)  [phone] (fax)

## 2017-01-09 ENCOUNTER — Telehealth: Payer: Self-pay

## 2017-01-09 LAB — CBC WITH DIFFERENTIAL/PLATELET
BASOS: 0 %
Basophils Absolute: 0 10*3/uL (ref 0.0–0.2)
EOS (ABSOLUTE): 0.3 10*3/uL (ref 0.0–0.4)
EOS: 4 %
HEMATOCRIT: 35.2 % (ref 34.0–46.6)
Hemoglobin: 12.3 g/dL (ref 11.1–15.9)
IMMATURE GRANS (ABS): 0 10*3/uL (ref 0.0–0.1)
Immature Granulocytes: 0 %
LYMPHS ABS: 2.5 10*3/uL (ref 0.7–3.1)
Lymphs: 36 %
MCH: 31.6 pg (ref 26.6–33.0)
MCHC: 34.9 g/dL (ref 31.5–35.7)
MCV: 91 fL (ref 79–97)
MONOCYTES: 9 %
Monocytes Absolute: 0.7 10*3/uL (ref 0.1–0.9)
NEUTROS ABS: 3.4 10*3/uL (ref 1.4–7.0)
Neutrophils: 51 %
Platelets: 210 10*3/uL (ref 150–379)
RBC: 3.89 x10E6/uL (ref 3.77–5.28)
RDW: 14.3 % (ref 12.3–15.4)
WBC: 7 10*3/uL (ref 3.4–10.8)

## 2017-01-09 LAB — BASIC METABOLIC PANEL
BUN / CREAT RATIO: 17 (ref 12–28)
BUN: 17 mg/dL (ref 8–27)
CO2: 23 mmol/L (ref 20–29)
CREATININE: 1.01 mg/dL — AB (ref 0.57–1.00)
Calcium: 9.5 mg/dL (ref 8.7–10.3)
Chloride: 102 mmol/L (ref 96–106)
GFR, EST AFRICAN AMERICAN: 60 mL/min/{1.73_m2} (ref >59)
GFR, EST NON AFRICAN AMERICAN: 52 mL/min/{1.73_m2} — AB (ref >59)
Glucose: 100 mg/dL — ABNORMAL HIGH (ref 65–99)
Potassium: 4.5 mmol/L (ref 3.5–5.2)
SODIUM: 142 mmol/L (ref 134–144)

## 2017-01-09 NOTE — Telephone Encounter (Signed)
Call placed to Pt's PCP.  Left message notifying that labs were obtained yesterday 01/08/2017 while Pt in office for stress test d/t [name] insisting we draw them.  Pt was to have labs drawn at PCP visit for 01/08/2017 but that appt had to be cancelled d/t need for stress test.

## 2017-01-10 ENCOUNTER — Ambulatory Visit (HOSPITAL_COMMUNITY): Payer: PPO | Attending: Cardiology

## 2017-01-10 LAB — MYOCARDIAL PERFUSION IMAGING
CHL CUP NUCLEAR SDS: 2
CHL CUP NUCLEAR SRS: 15
CHL CUP NUCLEAR SSS: 17
CSEPPHR: 81 {beats}/min
LHR: 0.36
LVDIAVOL: 294 mL (ref 46–106)
LVSYSVOL: 236 mL
Rest HR: 76 {beats}/min
TID: 0.96

## 2017-01-10 MED ORDER — TECHNETIUM TC 99M TETROFOSMIN IV KIT
31.2000 | PACK | Freq: Once | INTRAVENOUS | Status: AC | PRN
  Administered 2017-01-10: 31.2 via INTRAVENOUS
  Filled 2017-01-10: qty 32

## 2017-01-15 ENCOUNTER — Ambulatory Visit (INDEPENDENT_AMBULATORY_CARE_PROVIDER_SITE_OTHER): Payer: PPO | Admitting: Cardiology

## 2017-01-15 ENCOUNTER — Encounter: Payer: Self-pay | Admitting: Cardiology

## 2017-01-15 VITALS — BP 128/74 | HR 86 | Ht 66.0 in | Wt 233.6 lb

## 2017-01-15 DIAGNOSIS — Z7901 Long term (current) use of anticoagulants: Secondary | ICD-10-CM | POA: Diagnosis not present

## 2017-01-15 DIAGNOSIS — I5022 Chronic systolic (congestive) heart failure: Secondary | ICD-10-CM | POA: Diagnosis not present

## 2017-01-15 DIAGNOSIS — I481 Persistent atrial fibrillation: Secondary | ICD-10-CM

## 2017-01-15 DIAGNOSIS — I4819 Other persistent atrial fibrillation: Secondary | ICD-10-CM

## 2017-01-15 NOTE — Progress Notes (Signed)
[facility]. [address] [place], [place]  [address] Phone: [phone] Fax:  [phone]  Date:  01/15/2017   ID:  [name], Bianca [birth year], MRN [id number]  PCP:  Maurice Small, MD   History of Present Illness: Bianca [name] is a 83 y.o. female with nonischemic cardiomyopathy, hx of atrial fibrillation-currently with chronic anticoagulation, hx of Chronic kidney disease-stage III, chronic systolic heart failure-status post Bi- ventricular ICD-followed by Dr. Lovena Le. No defibrillations. EF 35%. Symptoms have been very stable. NYHA class II except for hospitalization for heart failure she was discharged on 11/24/12 with primary diagnosis of acute on chronic systolic heart failure. She was admitted with fluid overload, shortness of breath and diuresed successfully.  She was also admitted to the hospital in July 2018 with CHF exacerbation EF was once again down again and plan was to follow-up with outpatient stress testing.  She did undergo a nuclear stress test on 01/10/17:  The left ventricular ejection fraction is severely decreased (<30%).  Nuclear stress EF: 20%.  There was no ST segment deviation noted during stress.  Defect 1: There is a large defect of moderate severity present in the basal inferior, basal inferolateral, mid inferior, mid inferolateral and apical inferior location.  This is a high risk study.  Findings consistent with prior myocardial infarction.   High risk study due to the presence of a large inferolateral scar and severely depressed overall left ventricular systolic function. There is no meaningful reversible ischemia identified.  I personally reviewed her previous angiogram and she had wide-open coronary arteries in 2008. She has had no chest pain episodes suggestive of old inferior infarct. Perhaps some of this was attenuation artifact?.  Since being in the hospital in July 2018 she is feeling better. That episode came on all of a sudden.  She just seen Dr. Justin [name]. Her daughter states that her ICD has been the only reliable way to detect fluid overload. Currently she denies any significant shortness of breath. She has frequent urination.  Remember angioedema with ACE inhibitor.  This CHF episodes seem to come on quite suddenly.  She did have periods of hypercarbia with CO2 above 50 with hypersomnolence and has had untreated sleep apnea. Still with mild SOB when talking but not severe. Doing well with CPAP.   Husband had dementia, died. Her family is very helpful with her overall care.  Unfortunately her son has lung cancer that has metastasized. He is 81 years old.  Prior ICD transmission shows decreased impedance which may be suggestive of increased fluid.  Lives [facility]. BP low 90's at times. Dr. Justin [name] - BP low that day. Only had one day of dizziness. Has not had any dizziness since. When Dr. Justin [name] saw her and she had lower blood pressure, there was consideration of decreasing her torsemide. I'm worried about doing this given her tenuous status of the last few months going in and out of the hospital 3 separate occasions. She currently has a blood pressure of 110/68, pulse of 90. We will continue current regimen.   Wt Readings from Last 3 Encounters:  01/15/17 233 lb 9.6 oz (106 kg)  01/08/17 231 lb (104.8 kg)  01/08/17 225 lb (102.1 kg)     Past Medical History:  Diagnosis Date  . Acute blood loss anemia   . Arthritis   . CAD (coronary artery disease)/LBBB 01/01/2017  . Chronic systolic heart failure ([facility])   . CKD (chronic kidney disease), stage III  12/22/2015  . Depression 11/24/2015  . Hypertension   . Hypokalemia   . Myocardial infarction ([facility]) 2008  . Neuromuscular disorder ([facility])   . Paroxysmal atrial fibrillation ([facility])   . Physical deconditioning   . Right flank hematoma   . Sleep apnea     Past Surgical History:  Procedure Laterality Date  . ABDOMINAL HYSTERECTOMY    . ANKLE SURGERY    . CARDIAC  CATHETERIZATION    . CHOLECYSTECTOMY    . EP IMPLANTABLE DEVICE N/A 02/29/2016   Procedure: BIV ICD Generator Changeout;  Surgeon: Evans Lance, MD;  Location: [facility];  Service: Cardiovascular;  Laterality: N/A;  . EYE SURGERY    . INSERT / REPLACE / REMOVE PACEMAKER  2008  . KNEE LIGAMENT RECONSTRUCTION  2012  . TONSILLECTOMY      Current Outpatient Prescriptions  Medication Sig Dispense Refill  . acetaminophen (TYLENOL) 325 MG tablet Take 650 mg by mouth 2 (two) times daily as needed for mild pain or headache.     . allopurinol (ZYLOPRIM) 100 MG tablet Take 100 mg by mouth See admin instructions. Takes along with a 300 mg tablet to equal 400 mg     . allopurinol (ZYLOPRIM) 300 MG tablet Take 300 mg by mouth See admin instructions. Take with the 100mg  tablet to equal 400mg  daily     . atorvastatin (LIPITOR) 10 MG tablet Take 1 tablet (10 mg total) by mouth daily at 6 PM. 90 tablet 3  . benzonatate (TESSALON) 200 MG capsule Take 200 mg by mouth 3 (three) times daily as needed for cough.  1  . bisoprolol (ZEBETA) 5 MG tablet Take 0.5 tablets (2.5 mg total) by mouth at bedtime. 45 tablet 3  . Calcium Carbonate-Vit D-Min (CALCIUM/VITAMIN D/MINERALS) 600-200 MG-UNIT TABS Take 1 tablet by mouth daily.    . Cholecalciferol (VITAMIN D3) 1000 UNITS CAPS Take 1 capsule by mouth daily.    . colestipol (COLESTID) 1 G tablet Take 2 g by mouth at bedtime.     Marland Kitchen ELIQUIS 5 MG TABS tablet TAKE 1 TABLET TWICE DAILY 180 tablet 0  . gabapentin (NEURONTIN) 800 MG tablet Take 800 mg by mouth 3 (three) times daily.    Marland Kitchen guaiFENesin (MUCINEX) 600 MG 12 hr tablet Take 1 tablet (600 mg total) by mouth 2 (two) times daily.    . isosorbide mononitrate (IMDUR) 30 MG 24 hr tablet Take 1 tablet (30 mg total) by mouth daily. 30 tablet 0  . nitroGLYCERIN (NITROSTAT) 0.4 MG SL tablet Place 1 tablet (0.4 mg total) under the tongue every 5 (five) minutes as needed for chest pain. 25 tablet 6  . nortriptyline  (PAMELOR) 25 MG capsule Take 25 mg by mouth at bedtime.    . OXYGEN Inhale 2 L/min into the lungs at bedtime as needed (uses as bedtime everynight and then occasionally iun the daytime as needed for SOB).     . potassium chloride SA (K-DUR,KLOR-CON) 20 MEQ tablet Take 2 tablets (40 mEq total) by mouth 2 (two) times daily. 360 tablet 3  . senna-docusate (SENOKOT-S) 8.6-50 MG tablet Take 1 tablet by mouth daily as needed for mild constipation.    Marland Kitchen spironolactone (ALDACTONE) 25 MG tablet Take 0.5 tablets (12.5 mg total) by mouth daily. 30 tablet 11  . torsemide (DEMADEX) 20 MG tablet Take 4 tablets (80 mg total) by mouth daily. Take additional 40 mg (2 tablets) in the afternoon on Tuesday, Thursday and Saturday 440 tablet 3  No current facility-administered medications for this visit.     Allergies:    Allergies  Allergen Reactions  . Quinapril Hcl Swelling    Tongue and throat  . Tape Other (See Comments)    Plastic tape causes irritation.   Lavella Lemons [Benzonatate] Swelling and Other (See Comments)    Capsule opened in mouth, not an allergy  . Neosporin [Neomycin-Polymyxin-Gramicidin] Rash    Social History:  The patient  reports that she has quit smoking. She has quit using smokeless tobacco. She reports that she does not drink alcohol or use drugs.   ROS:  Please see the history of present illness. Has issues with neuropathy, fatigue, occasional shortness of breath. No chest pain, no bleeding.  PHYSICAL EXAM: VS:  BP 128/74   Pulse 86   Ht 5\' 6"  (1.676 m)   Wt 233 lb 9.6 oz (106 kg)   LMP  (LMP Unknown)   BMI 37.70 kg/m  GEN: Well nourished, well developed, in no acute distress Walker HEENT: normal  Neck: no JVD, carotid bruits, or masses Cardiac: RRR; no murmurs, rubs, or gallops,no edema  Respiratory:  clear to auscultation bilaterally, normal work of breathing GI: soft, nontender, nondistended, + BS, obese MS: no deformity or atrophy  Skin: warm and dry, no rash Neuro:   Alert and Oriented x 3, Strength and sensation are intact Psych: euthymic mood, full affect   EKG:  Prior 12/28/14-appears sinus rhythm low voltage P waves seems to be proceeding curette complex with ventricular pacing..    Labs: 07/20/14-triglycerides 332, LDL 80, HDL 39   Cardiac catheterization 2008: No significant coronary artery disease-personally viewed  ASSESSMENT AND PLAN:  1. Atrial fibrillation-currently controlled, chronic anticoagulation in place. Eliquis CHADS-VASC - 5. Defibrillator/Pacemaker in place.  Dr. Tanna Furry note reviewed. No current symptoms.  2. Chronic anticoagulation- Eliquis continue to monitor CBC and basic metabolic profile  3. Nonischemic Cardiomyopathy/chronic systolic heart failure-EF 20-35%. Medications reviewed. NYHA 2. Last hospitalization 12/2016 and before that- 11/2012. Watch her diet, sodium. No changes made in her diuretic regimen at this point. Stressed the importance of 1 L fluid restriction. No salt. She states that she is often thirsty. I explained the physiology behind this. Unable to take ACE inhibitor because of angioedema. We will continue with isosorbide. Continue with bisoprolol. 4. History of ICD implantation, new device placed 02/29/16. - functioning well. Having monthly transmissions. This helps [place] to determine fluid status as well. 5. Hyperlipidemia/hypertriglyceridemia- had been on Lopid as well as pravastatin. Baseline triglycerides are greater than 500 which puts her at risk for pancreatitis. She is now taking atorvastatin 10 mg once a day. We will continue. Education reviewed. 6. Right knee osteoarthrosis - uses walker 7. Morbid obesity-continue to encourage weight loss. BMI greater than 35 with tumor comorbidities 8. 2 month follow up with APP  Signed, Candee Furbish, MD [facility]  01/15/2017 2:51 PM

## 2017-01-15 NOTE — Patient Instructions (Addendum)
Medication Instructions:  The current medical regimen is effective;  continue present plan and medications.  Please continue your 1 liter daily fluid restriction. Keep your dry weight as close to 217 lbs on your home scales.  Follow-Up: Follow up in  2 months with   Thank you for choosing [facility]!!

## 2017-01-18 ENCOUNTER — Ambulatory Visit (INDEPENDENT_AMBULATORY_CARE_PROVIDER_SITE_OTHER): Payer: Self-pay

## 2017-01-18 DIAGNOSIS — I5022 Chronic systolic (congestive) heart failure: Secondary | ICD-10-CM

## 2017-01-18 DIAGNOSIS — Z95810 Presence of automatic (implantable) cardiac defibrillator: Secondary | ICD-10-CM

## 2017-01-18 NOTE — Progress Notes (Signed)
EPIC Encounter for ICM Monitoring  Patient Name: Bianca [name] is a 83 y.o. female Date: 01/18/2017 Primary Care Physican: Maurice Small, MD Primary Cardiologist:Skains/Laura Dorene Ar, NP Electrophysiologist: Lovena Le Dry Weight:220 lbs Bi-V Pacing: 99.1%              Spoke with daughter in law, [name] (DPR).  Heart Failure questions reviewed, pt asymptomatic.   Hospitalized from 01/01/2017 to 01/04/2017 for CHF exacerbation.          Patient advised by Dr Marlou Porch on 01/15/2017 office visit to limit fluids to 1 liter daily and keep dry weight as close to 217 lbs on her home scales.   Thoracic impedance abnormal suggesting fluid accumulation since 12/07/2016 but has almost returned to baseline today.  Prescribed dosage: Torsemide 20 mg 4 tablets (80 mg total) by mouth daily. Take additional 40 mg (2 tablets) in the afternoon on Tuesday, Thursday and Saturday. Potassium 20 mEq 2 tablets (40 mg total) bid  Labs: 09/08/2017Creatinine 1.01, BUN 21, Potassium 4.0, Sodium 138  01/01/2016 Creatinine 1.02, BUN 38, Potassium 3.9, Sodium 133  12/31/2015 Creatinine 1.10, BUN 31, Potassium 3.6, Sodium 140  12/30/2015 Creatinine 1.02, BUN 25, Potassium 3.4, Sodium 137  12/29/2015 Creatinine 0.84, BUN 27, Potassium 3.0, Sodium 136  12/28/2015 Creatinine 0.83, BUN 26, Potassium 3.4, Sodium 138  12/27/2015 Creatinine 0.96, BUN 2.6Potassium 3.3, Sodium 137  12/27/2015 Creatinine 0.86, BUN 29, Potassium 4.9, Sodium 134  12/26/2015 Creatinine 1.30, BUN 31, Potassium 3.9, Sodium 134  12/25/2015 Creatinine 1.25, BUN 33, Potassium 3.9, Sodium 134  12/24/2015 Creatinine 1.22, BUN 25, Potassium 3.9, Sodium 136  12/23/2015 Creatinine 1.11, BUN 19, Potassium 4.0, Sodium 138  12/22/2015 Creatinine 1.02, BUN 15, Potassium 3.1, Sodium 139  12/21/2015 Creatinine 1.17, BUN 18, Potassium 3.0, Sodium 138   Recommendations: No changes today.    Follow-up plan: ICM clinic phone appointment on 01/28/2017 to recheck  fluid levels.   Office appointment with [name], NP on 03/28/2017  Copy of ICM check sent to Dr Marlou Porch and Dr Lovena Le.   3 month ICM trend: 01/18/2017   1 Year ICM trend:      Rosalene Billings, RN 01/18/2017 7:47 AM

## 2017-01-21 ENCOUNTER — Ambulatory Visit: Payer: PPO | Admitting: Nurse Practitioner

## 2017-01-24 ENCOUNTER — Telehealth: Payer: Self-pay

## 2017-01-24 ENCOUNTER — Ambulatory Visit (INDEPENDENT_AMBULATORY_CARE_PROVIDER_SITE_OTHER): Payer: Self-pay

## 2017-01-24 DIAGNOSIS — G4733 Obstructive sleep apnea (adult) (pediatric): Secondary | ICD-10-CM | POA: Diagnosis not present

## 2017-01-24 DIAGNOSIS — Z95810 Presence of automatic (implantable) cardiac defibrillator: Secondary | ICD-10-CM

## 2017-01-24 DIAGNOSIS — I5022 Chronic systolic (congestive) heart failure: Secondary | ICD-10-CM

## 2017-01-24 MED ORDER — METOLAZONE 2.5 MG PO TABS: ORAL_TABLET | ORAL | 0 refills | Status: DC

## 2017-01-24 NOTE — Telephone Encounter (Signed)
Received voice mail message from daughter in law to call patient son regarding patients symptoms and remote transmission.  Call to son.  He stated he is on the way to patient's home and will need assistance on how to send remote transmission.  Patient has been having a cough and swelling of the feet for several days.  Confirmed he has ICM number and will assist with transmission when he calls back.

## 2017-01-24 NOTE — Telephone Encounter (Signed)
Spoke with son and assisted with sending ICM Remote transmission.  See ICM note for further information

## 2017-01-24 NOTE — Progress Notes (Signed)
EPIC Encounter for ICM Monitoring  Patient Name: Bianca [name] is a 83 y.o. female Date: 01/24/2017 Primary Care Physican: Maurice Small, MD Primary Cardiologist:Skains/Laura Dorene Ar, NP Electrophysiologist: Lovena Le Dry Weight:220 lbs Bi-V Pacing: 99%         Remote transmission today as requested by daughter in law and son due to patient has been symptomatic coughing at night and feet swelling.  She was hospitalized 01/01/2017 to 01/04/2017 for CHF exacerbation.     Thoracic impedance abnormal suggesting fluid accumulation since June and is close to baseline today.  Prescribed dosage: Torsemide 20 mg 4 tablets (80 mg total) by mouth daily. Take additional 40 mg (2 tablets) in the afternoon on Tuesday, Thursday and Saturday. Potassium 20 mEq 2 tablets (40 mg total) bid  Labs: 01/08/2017 Creatinine 1.01, BUN 17, Potassium 4.5, Sodium 142, EGFR 52-60 01/04/2017 Creatinine 1.09, BUN 16, Potassium 4.0, Sodium 139, EGFR 46-53  01/03/2017 Creatinine 1.13, BUN 18, Potassium 4.2, Sodium 142, EGFR 44-51  01/02/2017 Creatinine 1.17, BUN 18, Potassium 3.7, Sodium 141, EGFR 42-49  01/01/2017 Creatinine 1.10, BUN 17, Potassium 4.1, Sodium 140, EGFR 45-53  09/08/2017Creatinine 1.01, BUN 21, Potassium 4.0, Sodium 138  01/01/2016 Creatinine 1.02, BUN 38, Potassium 3.9, Sodium 133  12/31/2015 Creatinine 1.10, BUN 31, Potassium 3.6, Sodium 140  12/30/2015 Creatinine 1.02, BUN 25, Potassium 3.4, Sodium 137  12/29/2015 Creatinine 0.84, BUN 27, Potassium 3.0, Sodium 136  12/28/2015 Creatinine 0.83, BUN 26, Potassium 3.4, Sodium 138  12/27/2015 Creatinine 0.96, BUN 2.6Potassium 3.3, Sodium 137  12/27/2015 Creatinine 0.86, BUN 29, Potassium 4.9, Sodium 134  12/26/2015 Creatinine 1.30, BUN 31, Potassium 3.9, Sodium 134  12/25/2015 Creatinine 1.25, BUN 33, Potassium 3.9, Sodium 134  12/24/2015 Creatinine 1.22, BUN 25, Potassium 3.9, Sodium 136  12/23/2015 Creatinine 1.11, BUN 19, Potassium 4.0, Sodium  138  12/22/2015 Creatinine 1.02, BUN 15, Potassium 3.1, Sodium 139  12/21/2015 Creatinine 1.17, BUN 18, Potassium 3.0, Sodium 138   Recommendations:  Reviewed with Dr Caryl Comes in the office due to Dr Lovena Le and Dr Marlou Porch is out of the office today.  Call to daughter in law and advised Dr Caryl Comes recommended one time dose 2.5 mg Metolazone to be taken 30 minutes before Torsemide.  She verbalized understanding.     Follow-up plan: ICM clinic phone appointment on 01/28/2017.  Office appointment scheduled 03/28/2017 with [name], NP.   3 month ICM trend: 01/24/2017         1 Year ICM trend:      Rosalene Billings, RN 01/24/2017 8:29 AM

## 2017-01-25 ENCOUNTER — Telehealth: Payer: Self-pay

## 2017-01-25 NOTE — Telephone Encounter (Signed)
Received call at 9 AM from daughter in law stating patient has had very little urination in response to the 2.5 mg of Metolazone that was taken at 7:00 am this morning followed by the prescribed dose of Torsemide 30 minutes later.   Dr Caryl Comes was consulted at time of ICM call, 01/24/2017 when daughter in law reported patient had 2-3 days of night time cough and swelling of feet.  Metolazone was ordered on 01/24/2017, by Dr Caryl Comes for symptoms. ICM remote transmission showed patient has had decreased thoracic impedance since June but had improved.  DIL reported patient did not have much urine output after normal Torsemide dosage last night. Advised to give the Metolazone more time to work and I would advise Dr Caryl Comes of minimal urination since taking Metolazone this morning.  Message sent to Dr Olin Pia nurse in the office today for any recommendation if needed.

## 2017-01-25 NOTE — Telephone Encounter (Signed)
Received call back from DIL and she reported patient's urine output continues to be lower than normal and she has not had good results from Metolazone.  She reviewed all the latest details of the last hospitalization, office visits and her concerns.  Advised that I do not have any recommendations at this time other than to go to ER if needed.  Patient's weight is 220 lbs yesterday and 218.6 lbs today.  She sent a remote transmission for review.  Advised the report suggests there is a little more fluid accumulation than yesterday but no worse than it has been in the last 4-6 weeks.  Advised if I receive any recommendations will call her back but if needed she should take patient to ER.

## 2017-01-25 NOTE — Telephone Encounter (Signed)
Dr. Caryl Comes called the patient's daughter in law tonight. He advised that due to decreased weights and decreased swelling that they should continue with the same dose of torsemide over the weekend and transmit again on Monday. However, if symptoms worsen and become concerning to the family, they should take her to the ER for further evaluation.

## 2017-01-25 NOTE — Telephone Encounter (Signed)
Received call back from daughter in law asking if there were any recommendations.  She said she had expected to have heard something and advised not at this time.  Again explained if patient is urgent to take her to ER.  She said patient urinated again this afternoon but not having any urgent symptoms at this time.  She said she thinks she should have received some answers by now.  Advised physician is still in clinic at this time and to follow the advice I had given her today which is to take patient to ER over the weekend if needed.  Will follow up with daughter in law on Monday and review next transmission 01/28/2017

## 2017-01-25 NOTE — Telephone Encounter (Signed)
Received call back from DIL, [name] Debono and stated patient still has had very little urination since taking Metolazone at 7 am.  Patient complains of feeling very tired and sleepy.  Patient's cough and swelling of feet have resolved today.  She reported patient did complain Wednesday night that she suddenly became hot, sweaty and nauseated around time of her supper but that passed.  Patient generally not feeling well and still complaining of feeling very tired and sleepy. Advised if patient's symptoms become urgent she should proceed to ER if needed.

## 2017-01-27 ENCOUNTER — Other Ambulatory Visit: Payer: Self-pay | Admitting: Cardiology

## 2017-01-28 ENCOUNTER — Ambulatory Visit (INDEPENDENT_AMBULATORY_CARE_PROVIDER_SITE_OTHER): Payer: Self-pay

## 2017-01-28 DIAGNOSIS — Z95810 Presence of automatic (implantable) cardiac defibrillator: Secondary | ICD-10-CM

## 2017-01-28 DIAGNOSIS — I5022 Chronic systolic (congestive) heart failure: Secondary | ICD-10-CM

## 2017-01-28 NOTE — Progress Notes (Signed)
EPIC Encounter for ICM Monitoring  Patient Name: Bianca [name] is a 83 y.o. female Date: 01/28/2017 Primary Care Physican: Maurice Small, MD Primary Cardiologist:Skains/Laura Dorene Ar, NP Electrophysiologist: Druscilla Brownie OJNNNY:707 lbs Bi-V Pacing: 99%      Spoke with daughter in law, Bianca [name].  Heart Failure questions reviewed, pt asymptomatic now.  She reported patient started urinating more on Saturday so it took longer for the Metolazone to work.  Dr Caryl Comes spoke with daughter in law on Friday evening.  Patient slept a lot over the last 4 days which family thinks was related to Metolazone.   Thoracic impedance returned to normal after taking Metolazaone 2.5 mg one time dose on 8/9.  Prescribed dosage: Torsemide 20 mg 4 tablets (80 mg total) by mouth daily. Take additional 40 mg (2 tablets) in the afternoon on Tuesday, Thursday and Saturday. Potassium 20 mEq 2 tablets (40 mg total) bid  Labs: 01/08/2017 Creatinine 1.01, BUN 17, Potassium 4.5, Sodium 142, EGFR 52-60 01/04/2017 Creatinine 1.09, BUN 16, Potassium 4.0, Sodium 139, EGFR 46-53  01/03/2017 Creatinine 1.13, BUN 18, Potassium 4.2, Sodium 142, EGFR 44-51  01/02/2017 Creatinine 1.17, BUN 18, Potassium 3.7, Sodium 141, EGFR 42-49  01/01/2017 Creatinine 1.10, BUN 17, Potassium 4.1, Sodium 140, EGFR 45-53  09/08/2017Creatinine 1.01, BUN 21, Potassium 4.0, Sodium 138  01/01/2016 Creatinine 1.02, BUN 38, Potassium 3.9, Sodium 133  12/31/2015 Creatinine 1.10, BUN 31, Potassium 3.6, Sodium 140  12/30/2015 Creatinine 1.02, BUN 25, Potassium 3.4, Sodium 137  12/29/2015 Creatinine 0.84, BUN 27, Potassium 3.0, Sodium 136  12/28/2015 Creatinine 0.83, BUN 26, Potassium 3.4, Sodium 138  12/27/2015 Creatinine 0.96, BUN 2.6Potassium 3.3, Sodium 137   Recommendations: No changes.   Encouraged to call for fluid symptoms.  Follow-up plan: ICM clinic phone appointment on 02/25/2017.  Office appointment scheduled 03/28/2017 with  [name], NP.  Copy of ICM check sent to primary cardiologist and device physician.   3 month ICM trend: 01/28/2017   1 Year ICM trend:      Rosalene Billings, RN 01/28/2017 8:56 AM

## 2017-02-08 DIAGNOSIS — G4733 Obstructive sleep apnea (adult) (pediatric): Secondary | ICD-10-CM | POA: Diagnosis not present

## 2017-02-11 ENCOUNTER — Ambulatory Visit (INDEPENDENT_AMBULATORY_CARE_PROVIDER_SITE_OTHER): Payer: Self-pay

## 2017-02-11 DIAGNOSIS — Z95810 Presence of automatic (implantable) cardiac defibrillator: Secondary | ICD-10-CM

## 2017-02-11 DIAGNOSIS — I5022 Chronic systolic (congestive) heart failure: Secondary | ICD-10-CM

## 2017-02-11 MED ORDER — TORSEMIDE 20 MG PO TABS: ORAL_TABLET | ORAL | 3 refills | Status: DC

## 2017-02-11 NOTE — Progress Notes (Signed)
EPIC Encounter for ICM Monitoring  Patient Name: Bianca [name] is a 83 y.o. female Date: 02/11/2017 Primary Care Physican: Maurice Small, MD Primary Cardiologist:Skains/Laura Dorene Ar, NP Electrophysiologist: Druscilla Brownie [id number]:696 lbs Bi-V Pacing: 98.5%           Spoke with daughter in Sports coach.  Heart Failure questions reviewed, pt symptomatic with 7 lb weight gain within last 5 days and 5 of those pounds since last night (8/26).   Weight this AM was 222 lbs.  Also has leg swelling.   Thoracic impedance abnormal suggesting fluid accumulation since 8/16.   Patient received one time dose of Metolazaone 2.5 mg one time dose on 8/9 as ordered by Dr Caryl Comes (DOD) for fluid symptoms.   Prescribed dosage: Torsemide 20 mg 4 tablets (80 mg total) by mouth daily. Take additional 40 mg (2 tablets) in the afternoon on Tuesday, Thursday and Saturday. Potassium 20 mEq 2 tablets (40 mg total) bid  Labs: 01/08/2017 Creatinine 1.01, BUN 17, Potassium 4.5, Sodium 142, EGFR 52-60 01/04/2017 Creatinine 1.09, BUN 16, Potassium 4.0, Sodium 139, EGFR 46-53  01/03/2017 Creatinine 1.13, BUN 18, Potassium 4.2, Sodium 142, EGFR 44-51  01/02/2017 Creatinine 1.17, BUN 18, Potassium 3.7, Sodium 141, EGFR 42-49  01/01/2017 Creatinine 1.10, BUN 17, Potassium 4.1, Sodium 140, EGFR 45-53  09/08/2017Creatinine 1.01, BUN 21, Potassium 4.0, Sodium 138  01/01/2016 Creatinine 1.02, BUN 38, Potassium 3.9, Sodium 133  12/31/2015 Creatinine 1.10, BUN 31, Potassium 3.6, Sodium 140  12/30/2015 Creatinine 1.02, BUN 25, Potassium 3.4, Sodium 137  12/29/2015 Creatinine 0.84, BUN 27, Potassium 3.0, Sodium 136  12/28/2015 Creatinine 0.83, BUN 26, Potassium 3.4, Sodium 138  12/27/2015 Creatinine 0.96, BUN 2.6Potassium 3.3, Sodium 137  Recommendations:  Copy of ICM check sent to Dr. Marlou Porch and Dr. Lovena Le for review and recommendations.   Follow-up plan: ICM clinic phone appointment on 02/14/2017 to recheck fluid levels.       3 month ICM trend: 02/11/2017   1 Year ICM trend:      Rosalene Billings, RN 02/11/2017 8:00 AM

## 2017-02-11 NOTE — Progress Notes (Addendum)
Reviewed with Dr Marlou Porch.  Orders given to have pt take Torsemide 80 mg every AM and 40 mg 6 hrs after daily.  Recheck BMP in 1 week.

## 2017-02-11 NOTE — Progress Notes (Signed)
Call to daughter in law.  Advised of Dr Kingsley Plan orders to change Torsemide 20 mg to 4 tablets (80 mg total) every morning and 2 tablets (40 mg total) 6 hours after morning dosage daily.  BMP in a week.  She verbalized understanding.  Daughter in law reported patient stays with the limits of 2000 mg salt daily and 64 oz of fluid daily.

## 2017-02-12 ENCOUNTER — Telehealth: Payer: Self-pay

## 2017-02-12 NOTE — Telephone Encounter (Signed)
Reviewed with Dr Marlou Porch. Returned call to daughter in Sports coach, Nhyla Nappi.  Advised Dr Marlou Porch recommended patient restrict fluid intake as previously instructed at last office visit to 1 liter daily and limit salt intake.  He more time is needed to take the extra dosages to see how effective it will be.   Recommended patient continue with Torsemide 20 mg to 4 tablets (80 mg total) every morning and 2 tablets (40 mg total) and monitor daily weight.  Advised her to call if patient develops other fluid symptoms or weight gain continues.  Also use ER if needed.  She verbalized understanding.

## 2017-02-12 NOTE — Telephone Encounter (Signed)
Received call from daughter in law.  She reported patient started taking the extra Torsemide last night (8/27) as ordered by Dr Marlou Porch but patient has gained another 2 pounds last night.  She has a total gain of 9 lbs in last 6 days.  She reported patient did urinate a lot after taking the extra dosage last night and is not having any fluid symptoms.  She is asking for recommendation of what to do since patient gained 2 more pounds last night.  Advised would check if Dr Marlou Porch is available for recommendation.

## 2017-02-14 ENCOUNTER — Ambulatory Visit (INDEPENDENT_AMBULATORY_CARE_PROVIDER_SITE_OTHER): Payer: PPO

## 2017-02-14 DIAGNOSIS — I5022 Chronic systolic (congestive) heart failure: Secondary | ICD-10-CM | POA: Diagnosis not present

## 2017-02-14 DIAGNOSIS — Z95810 Presence of automatic (implantable) cardiac defibrillator: Secondary | ICD-10-CM

## 2017-02-14 NOTE — Progress Notes (Signed)
EPIC Encounter for ICM Monitoring  Patient Name: Bianca [name] is a 83 y.o. female Date: 02/14/2017 Primary Care Physican: Maurice Small, MD Primary Cardiologist:Skains/Laura Dorene Ar, NP Electrophysiologist: Lovena Le Dry Weight:216 lbs Bi-V Pacing: 98.5%       Spoke with daughter in law. Heart Failure questions reviewed, pt lost 6 pounds and ankle swelling has resolved.    Thoracic impedance remains unchanged since last ICM transmission 8/27 but symptoms have improved.  Prescribed dosage: Torsemide 80 mg every AM and 40 mg 6 hrs after daily.  Potassium 20 mEq 2 tablets (40 mg total) bid.  Recommendations: No changes.   Encouraged to call for fluid symptoms.  Follow-up plan: ICM clinic phone appointment on 02/25/2017.  Office appointment scheduled 03/28/2017 with [name], NP.  Copy of ICM check sent to Dr. Marlou Porch and Dr. Lovena Le.   3 month ICM trend: 02/14/2017   1 Year ICM trend:      Rosalene Billings, RN 02/14/2017 7:59 AM

## 2017-02-15 ENCOUNTER — Telehealth: Payer: Self-pay

## 2017-02-15 DIAGNOSIS — I5022 Chronic systolic (congestive) heart failure: Secondary | ICD-10-CM

## 2017-02-15 MED ORDER — METOLAZONE 2.5 MG PO TABS: ORAL_TABLET | ORAL | 0 refills | Status: DC

## 2017-02-15 NOTE — Telephone Encounter (Signed)
Call to daughter in law, Zyla Dascenzo.  Advised Dr Marlou Porch ordered Metolazone 2.5 mg 1 tablet for one dose only.  Will order total of 5 tablets but she is only to take 1 tablet 30 minutes prior to Torsemide.  After the one dose, only take as directed by Dr Marlou Porch.  She verbalized understanding.

## 2017-02-15 NOTE — Telephone Encounter (Signed)
Received call from daughter in law, Alyra Patty.  She reported patient has gained 6 pounds since yesterday morning so she regained the 6 pounds she lost on 8/29.     ICM remote transmission 8/30 still suggesting fluid accumulation and no change from decreased impedance on 8/27  Timeline:  Dry weight recommendation by Dr Marlou Porch 217 lbs  8/9 Patient received one time dose of Metolazone as ordered by DOD and weight dropped to 215 lbs 8/22 Weight 215 lbs  8/27 Weight 222 lbs - Torsemide changed to 80 mg every AM and 40 mg 6 hrs after daily 8/29 Weight 218  8/31 Weight 223 today  DIL reports patient has lost and gained total of 8 pounds since 8/22.    Advised would send to Dr Marlou Porch today for recommendations.

## 2017-02-15 NOTE — Telephone Encounter (Signed)
Let's give her another dose of metolazone 2.5 mg once. 5 tabs OK. Continue to weigh. Fluid and salt restrictions.  Candee Furbish, MD

## 2017-02-19 ENCOUNTER — Ambulatory Visit (INDEPENDENT_AMBULATORY_CARE_PROVIDER_SITE_OTHER): Payer: PPO | Admitting: Cardiology

## 2017-02-19 ENCOUNTER — Other Ambulatory Visit: Payer: PPO | Admitting: *Deleted

## 2017-02-19 ENCOUNTER — Ambulatory Visit (INDEPENDENT_AMBULATORY_CARE_PROVIDER_SITE_OTHER): Payer: PPO

## 2017-02-19 ENCOUNTER — Encounter: Payer: Self-pay | Admitting: Cardiology

## 2017-02-19 VITALS — BP 96/68 | HR 88 | Ht 66.0 in | Wt 224.8 lb

## 2017-02-19 DIAGNOSIS — I5022 Chronic systolic (congestive) heart failure: Secondary | ICD-10-CM

## 2017-02-19 DIAGNOSIS — I429 Cardiomyopathy, unspecified: Secondary | ICD-10-CM | POA: Diagnosis not present

## 2017-02-19 DIAGNOSIS — I4891 Unspecified atrial fibrillation: Secondary | ICD-10-CM | POA: Diagnosis not present

## 2017-02-19 DIAGNOSIS — Z7901 Long term (current) use of anticoagulants: Secondary | ICD-10-CM

## 2017-02-19 DIAGNOSIS — I5023 Acute on chronic systolic (congestive) heart failure: Secondary | ICD-10-CM | POA: Diagnosis not present

## 2017-02-19 DIAGNOSIS — Z95810 Presence of automatic (implantable) cardiac defibrillator: Secondary | ICD-10-CM | POA: Diagnosis not present

## 2017-02-19 NOTE — Patient Instructions (Addendum)
Medication Instructions:  Your physician recommends that you continue on your current medications as directed. Please refer to the Current Medication list given to you today.   Labwork: None ordered  Testing/Procedures: None ordered  Follow-Up: Keep your follow up appointment with [name], NP on 03/28/17 at 1:30 PM.    Any Other Special Instructions Will Be Listed Below (If Applicable).  You should have an Optival reading done every week on Wednesday for 1 month.  Your dry weight at home today was 212 lbs.  Please contact our office if your weight increases >3 lbs in one day or >5 lbs in one week.   If you need a refill on your cardiac medications before your next appointment, please call your pharmacy.

## 2017-02-19 NOTE — Progress Notes (Signed)
Cardiology Office Note   Date:  02/19/2017   ID:  Bianca [name], DOB 18-Aug-1932, MRN [id number]  PCP:  Maurice Small, MD  Cardiologist:    Dr. Marlou Porch EP Dr. Lovena Le  Chief Complaint  Patient presents with  . Edema      History of Present Illness: Bianca [name] is a 83 y.o. female who presents for increasing edema.  Her daughter in law, is with her today.   metolazone added 2.5 mg prior to tosemide.  Pt lost 10 lbs in 2 days.  Dry wt is 217 lbs.    Recent eval of ICD with no increase of fluid.  She has a h/o an ICM, chronic systolic CHF, LBBB, s/p BiV ICD implant.  Hx of VT, CAD  Chronic anticoagulation on Eliquis,  , CKD-3, with EF 35%. Symptoms have been very stable. NYHA class II except for hospitalization for heart failure she was discharged on 11/24/12 with primary diagnosis of acute on chronic systolic heart failure. She was admitted with fluid overload, shortness of breath and diuresed successfully.  Hx of cath 2008 for decreased LV systolic function.  No CAD except for minor irregularities. EF at that time was 25-30%.  Improved with BIV.   CPAP using.   Last saw Dr. Marlou Porch 01/15/17 after hospitalization She was also admitted to the hospital in July 2018 with CHF exacerbation EF was once again down again and plan was to follow-up with outpatient stress testing.  She did undergo a nuclear stress test on 01/10/17:  The left ventricular ejection fraction is severely decreased (<30%).  Nuclear stress EF: 20%.  There was no ST segment deviation noted during stress.  Defect 1: There is a large defect of moderate severity present in the basal inferior, basal inferolateral, mid inferior, mid inferolateral and apical inferior location.  This is a high risk study.  Findings consistent with prior myocardial infarction.  High risk study due to the presence of a large inferolateral scar and severely depressed overall left ventricular systolic function. There is no meaningful  reversible ischemia identified.  Dr. Marlou Porch personally reviewed her previous angiogram and she had wide-open coronary arteries in 2008. She has had no chest pain episodes suggestive of old inferior infarct. Perhaps some of this was attenuation artifact?.  Since being in the hospital in July 2018 she is feeling better. That episode came on all of a sudden. Her daughter-in law states that her ICD has been the only reliable way to detect fluid overload.Remember angioedema with ACE inhibitor.  This CHF episodes seem to come on quite suddenly.  She did have periods of hypercarbia with CO2 above 50 with hypersomnolence and has had untreated sleep apnea. Still with mild SOB when talking but not severe. Doing well with CPAP.   Recently she did develop lower ext edema.  Her wt did increase and her optivol increased.  Her  torsemide had already been increased to 80 in AM and 40 in early pm.  Unfortunately this did not help edema so she took one dose of metolazone 2.5 mg once with loss of 10 lbs in a day.  She is here for eval.  Today much better except for fatigue.  She just had BMP done.  Her home wt is 212 lbs today, though here she is 224 lbs.     Her BP is low today at 96/68 but she has no dizziness or lightheadedness.  She does have constipation have volume decrease but has meds at home to  take.  No chest pain.  No SOB today.   Past Medical History:  Diagnosis Date  . Acute blood loss anemia   . Arthritis   . CAD (coronary artery disease)/LBBB 01/01/2017  . Chronic systolic heart failure ([place])   . CKD (chronic kidney disease), stage III 12/22/2015  . Depression 11/24/2015  . Hypertension   . Hypokalemia   . Myocardial infarction ([place]) 2008  . Neuromuscular disorder ([place])   . Paroxysmal atrial fibrillation ([facility])   . Physical deconditioning   . Right flank hematoma   . Sleep apnea     Past Surgical History:  Procedure Laterality Date  . ABDOMINAL HYSTERECTOMY    . ANKLE SURGERY    .  CARDIAC CATHETERIZATION    . CHOLECYSTECTOMY    . EP IMPLANTABLE DEVICE N/A 02/29/2016   Procedure: BIV ICD Generator Changeout;  Surgeon: Evans Lance, MD;  Location: [facility];  Service: Cardiovascular;  Laterality: N/A;  . EYE SURGERY    . INSERT / REPLACE / REMOVE PACEMAKER  2008  . KNEE LIGAMENT RECONSTRUCTION  2012  . TONSILLECTOMY       Current Outpatient Prescriptions  Medication Sig Dispense Refill  . acetaminophen (TYLENOL) 325 MG tablet Take 650 mg by mouth 2 (two) times daily as needed for mild pain or headache.     . allopurinol (ZYLOPRIM) 100 MG tablet Take 100 mg by mouth See admin instructions. Takes along with a 300 mg tablet to equal 400 mg     . allopurinol (ZYLOPRIM) 300 MG tablet Take 300 mg by mouth See admin instructions. Take with the 100mg  tablet to equal 400mg  daily     . atorvastatin (LIPITOR) 10 MG tablet Take 1 tablet (10 mg total) by mouth daily at 6 PM. 90 tablet 3  . benzonatate (TESSALON) 200 MG capsule Take 200 mg by mouth 3 (three) times daily as needed for cough.  1  . bisoprolol (ZEBETA) 5 MG tablet Take 0.5 tablets (2.5 mg total) by mouth at bedtime. 45 tablet 3  . Calcium Carbonate-Vit D-Min (CALCIUM/VITAMIN D/MINERALS) 600-200 MG-UNIT TABS Take 1 tablet by mouth daily.    . Cholecalciferol (VITAMIN D3) 1000 UNITS CAPS Take 1 capsule by mouth daily.    . colestipol (COLESTID) 1 G tablet Take 2 g by mouth at bedtime.     Marland Kitchen ELIQUIS 5 MG TABS tablet TAKE 1 TABLET TWICE DAILY 180 tablet 0  . gabapentin (NEURONTIN) 800 MG tablet Take 800 mg by mouth 3 (three) times daily.    Marland Kitchen guaiFENesin (MUCINEX) 600 MG 12 hr tablet Take 1 tablet (600 mg total) by mouth 2 (two) times daily.    . isosorbide mononitrate (IMDUR) 30 MG 24 hr tablet TAKE 1 TABLET BY MOUTH EVERY DAY 30 tablet 11  . metolazone (ZAROXOLYN) 2.5 MG tablet Take 1 tablet for one dose only.  Take only as directed. 5 tablet 0  . nitroGLYCERIN (NITROSTAT) 0.4 MG SL tablet Place 1 tablet (0.4  mg total) under the tongue every 5 (five) minutes as needed for chest pain. 25 tablet 6  . nortriptyline (PAMELOR) 25 MG capsule Take 25 mg by mouth at bedtime.    . OXYGEN Inhale 2 L/min into the lungs at bedtime as needed (uses as bedtime everynight and then occasionally iun the daytime as needed for SOB).     . potassium chloride SA (K-DUR,KLOR-CON) 20 MEQ tablet Take 2 tablets (40 mEq total) by mouth 2 (two) times daily. 360 tablet 3  .  senna-docusate (SENOKOT-S) 8.6-50 MG tablet Take 1 tablet by mouth daily as needed for mild constipation.    Marland Kitchen spironolactone (ALDACTONE) 25 MG tablet Take 0.5 tablets (12.5 mg total) by mouth daily. 30 tablet 11  . torsemide (DEMADEX) 20 MG tablet Take 4 tablets (80 mg total) every morning and 2 tablets (40 mg total) six hours after morning dosage daily. 540 tablet 3   No current facility-administered medications for this visit.     Allergies:   Quinapril hcl; Tape; Tessalon [benzonatate]; and Neosporin [neomycin-polymyxin-gramicidin]    Social History:  The patient  reports that she has quit smoking. She has quit using smokeless tobacco. She reports that she does not drink alcohol or use drugs.   Family History:  The patient's family history includes Cancer in her brother; Congestive Heart Failure in her mother; Diabetes in her brother; Heart attack in her son; Stroke in her brother and mother; Tuberculosis in her father.    ROS:  General:no colds or fevers, + weight loss down 9 lbs on out scales.  Skin:no rashes or ulcers HEENT:no blurred vision, no congestion CV:see HPI PUL:see HPI GI:no diarrhea constipation or melena, no indigestion GU:no hematuria, no dysuria MS:no joint pain, no claudication Neuro:no syncope, no lightheadedness Endo:no diabetes, no thyroid disease  Wt Readings from Last 3 Encounters:  02/19/17 224 lb 12.8 oz (102 kg)  01/15/17 233 lb 9.6 oz (106 kg)  01/08/17 231 lb (104.8 kg)     PHYSICAL EXAM: VS:  BP 96/68   Pulse  88   Ht 5\' 6"  (1.676 m)   Wt 224 lb 12.8 oz (102 kg)   LMP  (LMP Unknown)   SpO2 93%   BMI 36.28 kg/m  , BMI Body mass index is 36.28 kg/m. General:Pleasant affect, NAD Skin:Warm and dry, brisk capillary refill HEENT:normocephalic, sclera clear, mucus membranes moist Neck:supple, no JVD, no bruits  Heart:S1S2 RRR without murmur, gallup, rub or click Lungs:clear without rales, rhonchi, or wheezes DGL:OVFI, non tender, + BS, do not palpate liver spleen or masses Ext:no lower ext edema, 2+ pedal pulses, 2+ radial pulses Neuro:alert and oriented x 3, MAE, follows commands, + facial symmetry    EKG:  EKG is NOT ordered today.   Recent Labs: 01/01/2017: ALT 14; B Natriuretic Peptide 975.5 01/08/2017: BUN 17; Creatinine, Ser 1.01; Hemoglobin 12.3; Platelets 210; Potassium 4.5; Sodium 142    Lipid Panel    Component Value Date/Time   CHOL 158 07/20/2014 1149   TRIG 332.0 (H) 07/20/2014 1149   HDL 38.60 (L) 07/20/2014 1149   CHOLHDL 4 07/20/2014 1149   VLDL 66.4 (H) 07/20/2014 1149   LDLCALC 73 09/30/2013 0948   LDLDIRECT 80.0 07/20/2014 1149       Other studies Reviewed: Additional studies/ records that were reviewed today include: . Study Highlights     The left ventricular ejection fraction is severely decreased (<30%).  Nuclear stress EF: 20%.  There was no ST segment deviation noted during stress.  Defect 1: There is a large defect of moderate severity present in the basal inferior, basal inferolateral, mid inferior, mid inferolateral and apical inferior location.  This is a high risk study.  Findings consistent with prior myocardial infarction.   High risk study due to the presence of a large inferolateral scar and severely depressed overall left ventricular systolic function. There is no meaningful reversible ischemia identified.    Echo 01/02/17 Study Conclusions  - Left ventricle: The cavity size was severely dilated. There was  moderate concentric  hypertrophy. Systolic function was severely   reduced. The estimated ejection fraction was in the range of 20%   to 25%. Diffuse hypokinesis. Features are consistent with a   pseudonormal left ventricular filling pattern, with concomitant   abnormal relaxation and increased filling pressure (grade 2   diastolic dysfunction). Doppler parameters are consistent with   elevated ventricular end-diastolic filling pressure. - Ventricular septum: Septal motion showed paradox. - Mitral valve: Calcified annulus. There was moderate   regurgitation. - Left atrium: The atrium was severely dilated. - Right ventricle: Systolic function was normal. - Right atrium: The atrium was normal in size. - Tricuspid valve: There was moderate regurgitation. - Pulmonic valve: There was no regurgitation. - Pulmonary arteries: Systolic pressure was moderately increased.   PA peak pressure: 46 mm Hg (S). - Pericardium, extracardiac: There was no pericardial effusion.  Impressions:  - When compared to the prior study from 10/26/2015, LVEF has   decreased from 50-55% to 20-25%.   Filling pressures are significantly elevated.   Moderate MR and TR.   Moderate pulmonary hypertension.  ASSESSMENT AND PLAN:  Volume overload - difficult to tell except with optivol.  Currently at dry wt on home scales.  Would like to check optivol weekly for 1 month to monitor.  She will check wt daily which she does and call if wt up 3 lbs in a day or 5 lbs in a week.  May need to use metolazone PRN - depending on labs today.    a fib- none on recent check - she was biV pacing on last check.  ? Need to optimize with echo   Chronic anticaoagulation on Eliquis no bleeding, BMNP ordered today as well   NICM --EF in July 20-25% unable to take ACE/ARB due to angioedema  Hx BiV  ICD new device 02/2016 followed by Dr. Lovena Le.   HLD on statin    Current medicines are reviewed with the patient today.  The patient Has no concerns  regarding medicines.  The following changes have been made:  See above Labs/ tests ordered today include:see above  Disposition:   FU:  see above  Signed, [name], NP  02/19/2017 3:20 PM    [facility] [place], [place], [address] [place], [place] Phone: [phone]; Fax: [phone]

## 2017-02-19 NOTE — Progress Notes (Signed)
EPIC Encounter for ICM Monitoring  Patient Name: Bianca [name] is a 83 y.o. female Date: 02/19/2017 Primary Care Physican: Maurice Small, MD Primary Cardiologist:Skains/Laura Dorene Ar, NP Electrophysiologist: Lovena Le Dry Weight:213lbs Bi-V Pacing: 98.2%      Received call from Daughter in [place], [place].  She reported the metolazone that was ordered on 8/31 worked and patient lost 10 pounds in 2 days.  Dry weight recommendation by Dr Marlou Porch 217 lbs        8/9 Patient received one time dose of Metolazone as ordered by DOD and weight dropped to 215 lbs       8/22 Weight 215 lbs        8/27 Weight 222 lbs - Torsemide changed to 80 mg every AM and 40 mg 6 hrs after daily       8/29 Weight 218        8/31 Weight 223 today.  Received one time dose os Metolazone 2.5 mg.        9/4 Weight loss of 10 pounds over the weekend   Thoracic impedance returned to normal since taking Metolazone dose on 8/31  BMET to be drawn today before office appointment as ordered by Dr Marlou Porch.   Prescribed dosage: Torsemide 20 mg 4 tablets (80 mg total) every morning and 2 tablets (40 mg total) six hours after morning dosage daily.  Recommendations:  Office appointment made today with [name], NP for follow up since patient is having difficulty getting weight stablized since July hospitalization.  Follow-up plan: ICM clinic phone appointment on 03/26/2017.  Office appointment scheduled 02/19/2017 with [name], NP.  Copy of ICM check sent to Dr. Marlou Porch, [name], NP and Dr. Lovena Le.   3 month ICM trend: 02/18/2017   1 Year ICM trend:      Rosalene Billings, RN 02/19/2017 11:08 AM

## 2017-02-19 NOTE — Progress Notes (Signed)
Thanks for update °Evelean Bigler, MD ° °

## 2017-02-20 LAB — SPECIMEN STATUS

## 2017-02-20 LAB — BASIC METABOLIC PANEL
BUN/Creatinine Ratio: 30 — ABNORMAL HIGH (ref 12–28)
BUN: 36 mg/dL — ABNORMAL HIGH (ref 8–27)
CO2: 27 mmol/L (ref 20–29)
CREATININE: 1.2 mg/dL — AB (ref 0.57–1.00)
Calcium: 9.7 mg/dL (ref 8.7–10.3)
Chloride: 92 mmol/L — ABNORMAL LOW (ref 96–106)
GFR, EST AFRICAN AMERICAN: 49 mL/min/{1.73_m2} — AB (ref >59)
GFR, EST NON AFRICAN AMERICAN: 42 mL/min/{1.73_m2} — AB (ref >59)
Glucose: 160 mg/dL — ABNORMAL HIGH (ref 65–99)
POTASSIUM: 3.6 mmol/L (ref 3.5–5.2)
SODIUM: 137 mmol/L (ref 134–144)

## 2017-02-21 NOTE — Progress Notes (Addendum)
ICM remote transmission scheduled for 9/10, 9/17 and 9/24.

## 2017-02-21 NOTE — Progress Notes (Signed)
Received: 2 days ago  Message Contents  Isaiah Serge, NP  Chey Rachels Panda, RN; Jerline Pain, MD        I saw Ms. Hoque today and her home wt is 212, she has no edema and no SOB. Difficult to eval for HF except with optivol. Could we check every Wed for 4 weeks to see if she remains stable? Also do we need to synch her BIV portion with echo to see if we can prevent volume overload?  She had labs today and they should come to you Dr. Marlou Porch.

## 2017-02-22 ENCOUNTER — Telehealth: Payer: Self-pay | Admitting: Cardiology

## 2017-02-22 NOTE — Telephone Encounter (Signed)
Daughter (on [place]) aware of results.

## 2017-02-22 NOTE — Telephone Encounter (Signed)
Bianca [name] is calling to get the lab results from 02/19/17 . Please call

## 2017-02-25 ENCOUNTER — Ambulatory Visit (INDEPENDENT_AMBULATORY_CARE_PROVIDER_SITE_OTHER): Payer: Self-pay

## 2017-02-25 ENCOUNTER — Telehealth: Payer: Self-pay

## 2017-02-25 DIAGNOSIS — Z95810 Presence of automatic (implantable) cardiac defibrillator: Secondary | ICD-10-CM

## 2017-02-25 DIAGNOSIS — I5022 Chronic systolic (congestive) heart failure: Secondary | ICD-10-CM

## 2017-02-25 MED ORDER — METOLAZONE 2.5 MG PO TABS: ORAL_TABLET | ORAL | 3 refills | Status: DC

## 2017-02-25 NOTE — Progress Notes (Signed)
Call to daughter in law.  Advised Dr Marlou Porch ordered Metolazone 2.5 mg 1 tablet once a week on Monday.  She verbalized understanding.

## 2017-02-25 NOTE — Progress Notes (Signed)
EPIC Encounter for ICM Monitoring  Patient Name: Bianca [name] is a 83 y.o. female Date: 02/25/2017 Primary Care Physican: Maurice Small, MD Primary Cardiologist:Skains/Laura Dorene Ar, NP Electrophysiologist: Lovena Le Dry Weight:222lbs today Bi-V Pacing: 98.2%       Daughter in law called and stated patient has had weight gain of 8 lbs since 9/5 with 6 of those pounds being last night.  Weight was 214 lbs on 02/20/2017.     Thoracic impedance is just below baseline suggesting fluid accumulation starting 02/22/2017.   Also decreased impedance was 01/31/2017 to 02/15/2017.  Prescribed dosage: Torsemide 20 mg 4 tablets (80 mg total) every morning and 2 tablets (40 mg total) six hours after morning dosage daily.  Potassium 20 mEq 2 tablets twice a day.  Labs: 02/19/2017 Creatinine 1.20, BUN 36, Potassium 3.6, Sodium 137, EGFR 42-49 01/08/2017 Creatinine 1.01, BUN 17, Potassium 4.5, Sodium 142, EGFR 52-60  01/04/2017 Creatinine 1.09, BUN 16, Potassium 4.0, Sodium 139, EGFR 46-53  01/03/2017 Creatinine 1.13, BUN 18, Potassium 4.2, Sodium 142, EGFR 44-51  01/02/2017 Creatinine 1.17, BUN 18, Potassium 3.7, Sodium 141, EGFR 42-49  01/01/2017 Creatinine 1.10, BUN 17, Potassium 4.1, Sodium 140, EGFR 45-53   Recommendations:  Advised Dr Marlou Porch or [name] will provide recommendations if needed and will call her back.  Daughter in law reported patient following fluid and salt restrictions.    Follow-up plan: ICM clinic phone appointment on 03/04/2017 to recheck fluid levels.  Office appointment scheduled 03/28/2017 with [name], NP.  Copy of ICM check sent to [name], NP, Dr. Marlou Porch and Dr. Lovena Le for review and recommendations.    3 month ICM trend: 02/25/2017   1 Year ICM trend:      Rosalene Billings, RN 02/25/2017 8:12 AM

## 2017-02-25 NOTE — Telephone Encounter (Signed)
Attempted call back to daughter in law as requested by voice mail message.

## 2017-02-25 NOTE — Progress Notes (Signed)
Please take metolazone 2.5 mg once a week on Monday Candee Furbish, MD

## 2017-02-25 NOTE — Addendum Note (Signed)
Addended by: Rosalene Billings on: 02/25/2017 02:08 PM   Modules accepted: Orders

## 2017-03-04 ENCOUNTER — Encounter (HOSPITAL_COMMUNITY): Payer: Self-pay | Admitting: Emergency Medicine

## 2017-03-04 ENCOUNTER — Ambulatory Visit (INDEPENDENT_AMBULATORY_CARE_PROVIDER_SITE_OTHER): Payer: Self-pay

## 2017-03-04 ENCOUNTER — Emergency Department (HOSPITAL_COMMUNITY)
Admission: EM | Admit: 2017-03-04 | Discharge: 2017-03-04 | Disposition: A | Discharge status: Home / Self Care | Payer: PPO | Attending: Emergency Medicine | Admitting: Emergency Medicine

## 2017-03-04 ENCOUNTER — Emergency Department (HOSPITAL_COMMUNITY): Payer: PPO

## 2017-03-04 DIAGNOSIS — M25552 Pain in left hip: Secondary | ICD-10-CM | POA: Diagnosis not present

## 2017-03-04 DIAGNOSIS — F329 Major depressive disorder, single episode, unspecified: Secondary | ICD-10-CM | POA: Insufficient documentation

## 2017-03-04 DIAGNOSIS — M25569 Pain in unspecified knee: Secondary | ICD-10-CM | POA: Diagnosis not present

## 2017-03-04 DIAGNOSIS — Z9049 Acquired absence of other specified parts of digestive tract: Secondary | ICD-10-CM | POA: Diagnosis not present

## 2017-03-04 DIAGNOSIS — S79912A Unspecified injury of left hip, initial encounter: Secondary | ICD-10-CM | POA: Diagnosis not present

## 2017-03-04 DIAGNOSIS — Y92129 Unspecified place in nursing home as the place of occurrence of the external cause: Secondary | ICD-10-CM | POA: Diagnosis not present

## 2017-03-04 DIAGNOSIS — Z7901 Long term (current) use of anticoagulants: Secondary | ICD-10-CM | POA: Insufficient documentation

## 2017-03-04 DIAGNOSIS — Y990 Civilian activity done for income or pay: Secondary | ICD-10-CM | POA: Diagnosis not present

## 2017-03-04 DIAGNOSIS — I2510 Atherosclerotic heart disease of native coronary artery without angina pectoris: Secondary | ICD-10-CM | POA: Diagnosis not present

## 2017-03-04 DIAGNOSIS — I130 Hypertensive heart and chronic kidney disease with heart failure and stage 1 through stage 4 chronic kidney disease, or unspecified chronic kidney disease: Secondary | ICD-10-CM | POA: Insufficient documentation

## 2017-03-04 DIAGNOSIS — Z95810 Presence of automatic (implantable) cardiac defibrillator: Secondary | ICD-10-CM | POA: Insufficient documentation

## 2017-03-04 DIAGNOSIS — W010XXA Fall on same level from slipping, tripping and stumbling without subsequent striking against object, initial encounter: Secondary | ICD-10-CM | POA: Diagnosis not present

## 2017-03-04 DIAGNOSIS — Z87891 Personal history of nicotine dependence: Secondary | ICD-10-CM | POA: Diagnosis not present

## 2017-03-04 DIAGNOSIS — Z9981 Dependence on supplemental oxygen: Secondary | ICD-10-CM | POA: Insufficient documentation

## 2017-03-04 DIAGNOSIS — W19XXXA Unspecified fall, initial encounter: Secondary | ICD-10-CM

## 2017-03-04 DIAGNOSIS — N183 Chronic kidney disease, stage 3 (moderate): Secondary | ICD-10-CM | POA: Diagnosis not present

## 2017-03-04 DIAGNOSIS — I252 Old myocardial infarction: Secondary | ICD-10-CM | POA: Diagnosis not present

## 2017-03-04 DIAGNOSIS — T148XXA Other injury of unspecified body region, initial encounter: Secondary | ICD-10-CM | POA: Diagnosis not present

## 2017-03-04 DIAGNOSIS — M25561 Pain in right knee: Secondary | ICD-10-CM | POA: Insufficient documentation

## 2017-03-04 DIAGNOSIS — I5023 Acute on chronic systolic (congestive) heart failure: Secondary | ICD-10-CM | POA: Insufficient documentation

## 2017-03-04 DIAGNOSIS — Z79899 Other long term (current) drug therapy: Secondary | ICD-10-CM | POA: Diagnosis not present

## 2017-03-04 DIAGNOSIS — I5022 Chronic systolic (congestive) heart failure: Secondary | ICD-10-CM

## 2017-03-04 NOTE — Progress Notes (Signed)
EPIC Encounter for ICM Monitoring  Patient Name: Bianca [name] is a 83 y.o. female Date: 03/04/2017 Primary Care Physican: Maurice Small, MD Primary Cardiologist:Skains/Laura Dorene Ar, NP Electrophysiologist: Lovena Le Dry Weight:217lbs today  Bi-V Pacing: 97.6%         Spoke with daughter in law. Pt symptomatic with overall 6 lb weight gain since 02/27/17.  She weighed 212 lbs on 9/12 and 219 lbs on 9/15. Today is she is 217 lbs. Dry weight recommendation by Dr Marlou Porch 217 lbs   Thoracic impedance normal.  Prescribed dosage: Torsemide 20 mg 4 tablets (80 mg total) every morning and 2 tablets (40 mg total) six hours after morning dosage daily.  Potassium 20 mEq 2 tablets twice a day.  Metolazone 2.5 mg 1 tablet weekly on Monday.  Labs: 02/19/2017 Creatinine 1.20, BUN 36, Potassium 3.6, Sodium 137, EGFR 42-49 01/08/2017 Creatinine 1.01, BUN 17, Potassium 4.5, Sodium 142, EGFR 52-60  01/04/2017 Creatinine 1.09, BUN 16, Potassium 4.0, Sodium 139, EGFR 46-53  01/03/2017 Creatinine 1.13, BUN 18, Potassium 4.2, Sodium 142, EGFR 44-51  01/02/2017 Creatinine 1.17, BUN 18, Potassium 3.7, Sodium 141, EGFR 42-49  01/01/2017 Creatinine 1.10, BUN 17, Potassium 4.1, Sodium 140, EGFR 45-53   Recommendations: No changes.    Encouraged to call for fluid symptoms.  Follow-up plan: ICM clinic phone appointment on 03/11/2017.  Office appointment scheduled 03/28/2017 with [name], NP.  Copy of ICM check sent to [name], NP, Dr. Marlou Porch and Dr. Lovena Le.   3 month ICM trend: 03/04/2017   1 Year ICM trend:      Rosalene Billings, RN 03/04/2017 8:39 AM

## 2017-03-04 NOTE — ED Notes (Signed)
Pt assisted with bedpan. Pt had 300 ml

## 2017-03-04 NOTE — ED Provider Notes (Signed)
[facility] DEPT Provider Note   CSN: [id number] Arrival date & time: 03/04/17  1842     History   Chief Complaint Chief Complaint  Patient presents with  . Fall    HPI Bianca [name] is a 83 y.o. female.  This is an 83 year old female with PMH of CHF with last known EF 20% status post ICD placement, HTN, CAD, paroxysmal A. 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  Patient states she was moving a walker for a patient when she tripped over the walker and fell landing on her right knee and then left hip.  She denies any head injury or LOC.  She currently endorses minimal left hip and right knee pain only with movement or tenderness to palpation.  She denies any numbness or tingling her extremities, visual disturbances, headaches, chest pain, abdominal pain, dyspnea, nausea, vomiting.   The history is provided by the patient and a relative.    Past Medical History:  Diagnosis Date  . Acute blood loss anemia   . Arthritis   . CAD (coronary artery disease)/LBBB 01/01/2017  . Chronic systolic heart failure ([place])   . CKD (chronic kidney disease), stage III 12/22/2015  . Depression 11/24/2015  . Hypertension   . Hypokalemia   . Myocardial infarction ([place]) 2008  . Neuromuscular disorder ([place])   . Paroxysmal atrial fibrillation (H[facility])   . Physical deconditioning   . Right flank hematoma   . Sleep apnea     Patient Active Problem List   Diagnosis Date Noted  . Acute on chronic systolic heart failure, NYHA class 1 ([place]) 01/01/2017  . Hypertension 01/01/2017  . Sleep apnea 01/01/2017  . CAD (coronary artery disease)/LBBB 01/01/2017  . History of ventricular tachycardia 01/01/2017  . PAF (paroxysmal atrial fibrillation) ([place]) 01/01/2017  . CKD (chronic kidney disease) stage 3, GFR 30-59 ml/min 01/01/2017  . Peripheral neuropathy 01/01/2017  . OSA on CPAP 01/01/2017  . Obesity (BMI 30-39.9) 01/01/2017  . ICD (implantable cardioverter-defibrillator) in  place 01/01/2017  . Acute combined systolic and diastolic congestive heart failure ([place]) 01/01/2017  . Acute on chronic systolic CHF (congestive heart failure) ([place])   . Dyspnea   . Ventricular tachycardia ([place])   . Subclinical hypothyroidism 12/25/2015  . Acute on chronic diastolic congestive heart failure ([place])   . CKD (chronic kidney disease), stage III 12/22/2015  . Hypoxia 12/22/2015  . Hypoxemia 12/21/2015  . CHF exacerbation ([place]) 11/24/2015  . Physical deconditioning 11/24/2015  . Hyperglycemia 11/24/2015  . Peripheral neuropathy 11/24/2015  . HLD (hyperlipidemia) 11/24/2015  . Depression 11/24/2015  . Chest pain 11/24/2015  . Neuropathy involving both lower extremities   . Paroxysmal atrial fibrillation (HCC)   . OSA (obstructive sleep apnea)   . E. coli UTI   . Subcutaneous hematoma   . Fall 10/25/2015  . Afib ([place]) 01/19/2015  . Long-term (current) use of anticoagulants 07/20/2014  . Persistent atrial fibrillation ([place]) 07/20/2014  . Encounter for therapeutic drug monitoring 07/16/2013  . Chronic systolic heart failure ([place]) 07/16/2013  . History of implantable cardioverter-defibrillator (ICD) placement 07/16/2013  . Morbid obesity ([place]) 07/16/2013  . Chronic anticoagulation 07/16/2013  . Knee osteoarthritis 07/16/2013  . HYPOKALEMIA 08/09/2010  . Neuropathy ([place]) 08/09/2010  . Chronic diastolic (congestive) heart failure ([place]) 08/09/2010  . Automatic implantable cardioverter-defibrillator in situ 08/09/2010    Past Surgical History:  Procedure Laterality Date  . ABDOMINAL HYSTERECTOMY    . ANKLE SURGERY    . CARDIAC  CATHETERIZATION    . CHOLECYSTECTOMY    . EP IMPLANTABLE DEVICE N/A 02/29/2016   Procedure: BIV ICD Generator Changeout;  Surgeon: Evans Lance, MD;  Location: [facility];  Service: Cardiovascular;  Laterality: N/A;  . EYE SURGERY    . INSERT / REPLACE / REMOVE PACEMAKER  2008  . KNEE LIGAMENT RECONSTRUCTION  2012  . TONSILLECTOMY       OB History    No data available       Home Medications    Prior to Admission medications   Medication Sig Start Date End Date Taking? Authorizing Provider  acetaminophen (TYLENOL) 325 MG tablet Take 650 mg by mouth 2 (two) times daily as needed for mild pain or headache.     [provider]  allopurinol (ZYLOPRIM) 100 MG tablet Take 100 mg by mouth See admin instructions. Takes along with a 300 mg tablet to equal 400 mg     [provider]  allopurinol (ZYLOPRIM) 300 MG tablet Take 300 mg by mouth See admin instructions. Take with the 100mg  tablet to equal 400mg  daily     [provider]  atorvastatin (LIPITOR) 10 MG tablet Take 1 tablet (10 mg total) by mouth daily at 6 PM. 07/04/16   Clegg, Amy D, NP  benzonatate (TESSALON) 200 MG capsule Take 200 mg by mouth 3 (three) times daily as needed for cough. 12/17/16   [provider]  bisoprolol (ZEBETA) 5 MG tablet Take 0.5 tablets (2.5 mg total) by mouth at bedtime. 07/04/16   Clegg, Amy D, NP  Calcium Carbonate-Vit D-Min (CALCIUM/VITAMIN D/MINERALS) 600-200 MG-UNIT TABS Take 1 tablet by mouth daily.    [provider]  Cholecalciferol (VITAMIN D3) 1000 UNITS CAPS Take 1 capsule by mouth daily.    [provider]  colestipol (COLESTID) 1 G tablet Take 2 g by mouth at bedtime.     [provider]  ELIQUIS 5 MG TABS tablet TAKE 1 TABLET TWICE DAILY 12/28/16   Larey Dresser, MD  gabapentin (NEURONTIN) 800 MG tablet Take 800 mg by mouth 3 (three) times daily.    [provider]  guaiFENesin (MUCINEX) 600 MG 12 hr tablet Take 1 tablet (600 mg total) by mouth 2 (two) times daily. 11/29/15   Theodis Blaze, MD  isosorbide mononitrate (IMDUR) 30 MG 24 hr tablet TAKE 1 TABLET BY MOUTH EVERY DAY 01/28/17   Jerline Pain, MD  metolazone (ZAROXOLYN) 2.5 MG tablet Take 1 tablet once a week on Monday. 02/25/17   Jerline Pain, MD  nitroGLYCERIN (NITROSTAT) 0.4 MG SL tablet Place 1  tablet (0.4 mg total) under the tongue every 5 (five) minutes as needed for chest pain. 11/24/12   Jettie Booze, MD  nortriptyline (PAMELOR) 25 MG capsule Take 25 mg by mouth at bedtime.    [provider]  OXYGEN Inhale 2 L/min into the lungs at bedtime as needed (uses as bedtime everynight and then occasionally iun the daytime as needed for SOB).     [provider]  potassium chloride SA (K-DUR,KLOR-CON) 20 MEQ tablet Take 2 tablets (40 mEq total) by mouth 2 (two) times daily. 07/04/16   Clegg, Amy D, NP  senna-docusate (SENOKOT-S) 8.6-50 MG tablet Take 1 tablet by mouth daily as needed for mild constipation.    [provider]  spironolactone (ALDACTONE) 25 MG tablet Take 0.5 tablets (12.5 mg total) by mouth daily. 01/08/17   Baldwin Jamaica, PA-C  torsemide [facility])  20 MG tablet Take 4 tablets (80 mg total) every morning and 2 tablets (40 mg total) six hours after morning dosage daily. 02/11/17   Jerline Pain, MD    Family History Family History  Problem Relation Age of Onset  . Stroke Mother   . Congestive Heart Failure Mother   . Tuberculosis Father   . Diabetes Brother   . Stroke Brother   . Cancer Brother   . Heart attack Son     Social History Social History  Substance Use Topics  . Smoking status: Former Research scientist (life sciences)  . Smokeless tobacco: Former Systems developer  . Alcohol use No     Allergies   Quinapril hcl; Tape; Tessalon [benzonatate]; and Neosporin [neomycin-polymyxin-gramicidin]   Review of Systems Review of Systems  Constitutional: Negative for chills, diaphoresis and fever.  HENT: Negative for ear pain and sore throat.   Eyes: Negative for pain and visual disturbance.  Respiratory: Negative for cough and shortness of breath.   Cardiovascular: Negative for chest pain and palpitations.  Gastrointestinal: Negative for abdominal pain and vomiting.  Genitourinary: Negative for dysuria and hematuria.  Musculoskeletal: Negative for arthralgias,  back pain and gait problem.  Skin: Negative for color change and rash.  Neurological: Negative for seizures and syncope.  All other systems reviewed and are negative.    Physical Exam Updated Vital Signs BP 109/72   Pulse 81   Temp 98.1 F (36.7 C) (Oral)   Resp 18   LMP  (LMP Unknown)   SpO2 90%   Physical Exam  Constitutional: She is oriented to person, place, and time. She appears well-developed and well-nourished. No distress.  HENT:  Head: Normocephalic and atraumatic.  Eyes: Pupils are equal, round, and reactive to light. Conjunctivae are normal.  Neck: Neck supple.  Cardiovascular: Normal rate and regular rhythm.   No murmur heard. Pulmonary/Chest: Effort normal and breath sounds normal. No respiratory distress.  Abdominal: Soft. There is no tenderness.  Musculoskeletal: She exhibits no edema.       Left hip: She exhibits tenderness. She exhibits normal range of motion, normal strength and no swelling.       Right knee: She exhibits decreased range of motion and bony tenderness. She exhibits no swelling, no effusion and no erythema. Tenderness found.  Neurological: She is alert and oriented to person, place, and time. She has normal strength. She displays no tremor. No cranial nerve deficit or sensory deficit. She exhibits normal muscle tone. Coordination normal.  Skin: Skin is warm and dry. She is not diaphoretic.  Psychiatric: She has a normal mood and affect.  Nursing note and vitals reviewed.    ED Treatments / Results  Labs (all labs ordered are listed, but only abnormal results are displayed) Labs Reviewed - No data to display  EKG  EKG Interpretation None       Radiology Dg Knee Complete 4 Views Right  Result Date: 03/04/2017 CLINICAL DATA:  Fall from standing after tripping over her walker. C/o left hip and right knee pain. Pt states her right knee is "bone on bone" but she is not a candidate for a replacement due to her heart. EXAM: RIGHT KNEE -  COMPLETE 4+ VIEW COMPARISON:  11/26/2013 FINDINGS: No fracture or dislocation.  No bone lesion. There is tricompartment joint space narrowing most evident of the lateral compartment mild-to-moderate in severity. There are marginal osteophytes from all 3 compartments. There is mild lateral compartment subchondral sclerosis. A small subchondral cyst is noted along the dorsum  of the patella. Bones are demineralized. There is no joint effusion. The surrounding soft tissues are unremarkable. IMPRESSION: 1. No fracture, dislocation or acute finding. 2. Degenerative changes as described which appear mildly advanced when compared to the prior study. Electronically Signed   By: Lajean Manes M.D.   On: 03/04/2017 20:29   Dg Hip Unilat W Or Wo Pelvis 2-3 Views Left  Result Date: 03/04/2017 CLINICAL DATA:  Fall from standing after tripping over her walker. C/o left hip and right knee pain. Pt states her right knee is "bone on bone" but she is not a candidate for a replacement due to her heart. EXAM: DG HIP (WITH OR WITHOUT PELVIS) 2-3V LEFT COMPARISON:  None. FINDINGS: No fracture.  No bone lesion. The hip joints, SI joints and symphysis pubis are normally spaced and aligned. No significant arthropathic change. Soft tissues are unremarkable. IMPRESSION: Negative. Electronically Signed   By: Lajean Manes M.D.   On: 03/04/2017 20:27    Procedures Procedures (including critical care time)  Medications Ordered in ED Medications - No data to display   Initial Impression / Assessment and Plan / ED Course  I have reviewed the triage vital signs and the nursing notes.  Pertinent labs & imaging results that were available during my care of the patient were reviewed by me and considered in my medical decision making (see chart for details).     This is an 83 year old female with PMH of CHF with last known EF 20% status post ICD placement, HTN, CAD, paroxysmal A. 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  Patient neurologically intact as noted above.  Given low mechanism of fall, patient's mental status at baseline according to family at bedside, no need for CT head at this time.  X-rays of the left hip and right knee taken.  Patient refused pain medications when offered. X-rays negative for acute fracture or malalignment.  Given well-appearing patient, largely unremarkable diagnostic evaluation, no further diagnostic intervention is indicated at this time.  Patient able to ambulate prior to discharge.  Final Clinical Impressions(s) / ED Diagnoses   Final diagnoses:  Fall, initial encounter    New Prescriptions New Prescriptions   No medications on file     Aldona Lento, MD 03/04/17 2102    Merrily Pew, MD 03/04/17 2249

## 2017-03-04 NOTE — ED Notes (Signed)
Pt able to ambulate with slow steady gait using the walker.

## 2017-03-04 NOTE — ED Triage Notes (Signed)
Pt here after tripping over her walker, no loc . Pt is c/o bil knee pain and left hip pain

## 2017-03-06 DIAGNOSIS — M25562 Pain in left knee: Secondary | ICD-10-CM | POA: Diagnosis not present

## 2017-03-06 DIAGNOSIS — M170 Bilateral primary osteoarthritis of knee: Secondary | ICD-10-CM | POA: Diagnosis not present

## 2017-03-06 DIAGNOSIS — M25561 Pain in right knee: Secondary | ICD-10-CM | POA: Diagnosis not present

## 2017-03-06 DIAGNOSIS — M7062 Trochanteric bursitis, left hip: Secondary | ICD-10-CM | POA: Diagnosis not present

## 2017-03-11 ENCOUNTER — Ambulatory Visit (INDEPENDENT_AMBULATORY_CARE_PROVIDER_SITE_OTHER): Payer: PPO

## 2017-03-11 DIAGNOSIS — G4733 Obstructive sleep apnea (adult) (pediatric): Secondary | ICD-10-CM | POA: Diagnosis not present

## 2017-03-11 DIAGNOSIS — Z95810 Presence of automatic (implantable) cardiac defibrillator: Secondary | ICD-10-CM

## 2017-03-11 DIAGNOSIS — I5022 Chronic systolic (congestive) heart failure: Secondary | ICD-10-CM

## 2017-03-11 NOTE — Progress Notes (Signed)
EPIC Encounter for ICM Monitoring  Patient Name: Bianca [name] is a 83 y.o. female Date: 03/11/2017 Primary Care Physican: Maurice Small, MD Primary Cardiologist:Skains/Laura Dorene Ar, NP Electrophysiologist: Lovena Le Dry Weight:217lbs today  Bi-V Pacing: 94.9%      Spoke with daughter in law, Bianca [name].  Heart Failure questions reviewed, pt continues to have a 6-7 lb gain weekly but then is lost after taking Metolazone.   Thoracic impedance normal.  Prescribed dosage: Torsemide 20 mg 4 tablets (80 mg total) every morning and 2 tablets (40 mg total) six hours after morning dosage daily. Potassium 20 mEq 2 tablets twice a day.  Metolazone 2.5 mg 1 tablet weekly on Monday.  Labs: 02/19/2017 Creatinine 1.20, BUN 36, Potassium 3.6, Sodium 137, EGFR 42-49 01/08/2017 Creatinine 1.01, BUN 17, Potassium 4.5, Sodium 142, EGFR 52-60  01/04/2017 Creatinine 1.09, BUN 16, Potassium 4.0, Sodium 139, EGFR 46-53  01/03/2017 Creatinine 1.13, BUN 18, Potassium 4.2, Sodium 142, EGFR 44-51  01/02/2017 Creatinine 1.17, BUN 18, Potassium 3.7, Sodium 141, EGFR 42-49  01/01/2017 Creatinine 1.10, BUN 17, Potassium 4.1, Sodium 140, EGFR 45-53  Recommendations: No changes.   Encouraged to call for fluid symptoms. Advised I will be out of the office next week and to call Dr Marlou Porch office directly to speak with the nurse if needed.   Follow-up plan: ICM clinic phone appointment on 03/26/2017.  Office appointment scheduled 03/28/2017 with [name], NP.    Copy of ICM check sent to Dr. Lovena Le and [name], NP.   3 month ICM trend: 03/11/2017   1 Year ICM trend:      Rosalene Billings, RN 03/11/2017 10:04 AM

## 2017-03-14 ENCOUNTER — Other Ambulatory Visit (HOSPITAL_COMMUNITY): Payer: Self-pay | Admitting: Adult Health

## 2017-03-15 NOTE — Telephone Encounter (Signed)
Medication Detail    Disp Refills Start End   spironolactone (ALDACTONE) 25 MG tablet 30 tablet 11 01/08/2017    Sig - Route: Take 0.5 tablets (12.5 mg total) by mouth daily. - Oral   Sent to pharmacy as: spironolactone (ALDACTONE) 25 MG tablet   E-Prescribing Status: Receipt confirmed by pharmacy (01/08/2017 4:42 PM EDT)   Pharmacy   [id number] - [place], [place] - [address]

## 2017-03-26 ENCOUNTER — Ambulatory Visit (INDEPENDENT_AMBULATORY_CARE_PROVIDER_SITE_OTHER): Payer: PPO | Admitting: *Deleted

## 2017-03-26 DIAGNOSIS — I429 Cardiomyopathy, unspecified: Secondary | ICD-10-CM

## 2017-03-26 DIAGNOSIS — Z95810 Presence of automatic (implantable) cardiac defibrillator: Secondary | ICD-10-CM

## 2017-03-26 DIAGNOSIS — I5022 Chronic systolic (congestive) heart failure: Secondary | ICD-10-CM | POA: Diagnosis not present

## 2017-03-26 NOTE — Progress Notes (Addendum)
EPIC Encounter for ICM Monitoring  Patient Name: Bianca [name] is a 83 y.o. female Date: 03/26/2017 Primary Care Physican: Maurice Small, MD Primary Cardiologist:Skains/Laura Dorene Ar, NP Electrophysiologist: Lovena Le Dry Weight:219lbs today  Bi-V Pacing: 94.9%         Spoke with daughter in law, Dinora Hemm.  Heart Failure questions reviewed, pt continues to have a 6-7 lb gain weekly but loses the weight after taking Metolazone on Mondays.   Thoracic impedance normal.  Pattern of impedance above baseline correlates with days that Metolazone are taken.   Prescribed dosage: Torsemide 20 mg 4 tablets (80 mg total) every morning and 2 tablets (40 mg total) six hours after morning dosage daily. Potassium 20 mEq 2 tablets twice a day. Metolazone 2.5 mg 1 tablet weekly on Monday.  Labs: 02/19/2017 Creatinine 1.20, BUN 36, Potassium 3.6, Sodium 137, EGFR 42-49 01/08/2017 Creatinine 1.01, BUN 17, Potassium 4.5, Sodium 142, EGFR 52-60  01/04/2017 Creatinine 1.09, BUN 16, Potassium 4.0, Sodium 139, EGFR 46-53  01/03/2017 Creatinine 1.13, BUN 18, Potassium 4.2, Sodium 142, EGFR 44-51  01/02/2017 Creatinine 1.17, BUN 18, Potassium 3.7, Sodium 141, EGFR 42-49  01/01/2017 Creatinine 1.10, BUN 17, Potassium 4.1, Sodium 140, EGFR 45-53  Recommendations: No changes.  Daughter in law said patient is limiting salt intake and only eats the meals provided ALF. She said does not have any snacks in between meals and monitors fluid intake as well.   Encouraged to call for fluid symptoms.  Follow-up plan: ICM clinic phone appointment on 04/29/2017.  Office appointment scheduled 03/28/2017 with [name], NP.  Copy of ICM check sent to Dr. Lovena Le and [name], NP since patient has office visit on 03/28/2017.    3 month ICM trend: 03/26/2017   1 Year ICM trend:      Rosalene Billings, RN 03/26/2017 11:37 AM

## 2017-03-27 NOTE — Progress Notes (Signed)
Remote ICD transmission.   

## 2017-03-28 ENCOUNTER — Encounter: Payer: Self-pay | Admitting: Cardiology

## 2017-03-28 ENCOUNTER — Ambulatory Visit (INDEPENDENT_AMBULATORY_CARE_PROVIDER_SITE_OTHER): Payer: PPO | Admitting: Cardiology

## 2017-03-28 VITALS — BP 102/68 | HR 81 | Ht 66.0 in | Wt 219.0 lb

## 2017-03-28 DIAGNOSIS — I5022 Chronic systolic (congestive) heart failure: Secondary | ICD-10-CM | POA: Diagnosis not present

## 2017-03-28 DIAGNOSIS — Z7901 Long term (current) use of anticoagulants: Secondary | ICD-10-CM | POA: Diagnosis not present

## 2017-03-28 DIAGNOSIS — Z95810 Presence of automatic (implantable) cardiac defibrillator: Secondary | ICD-10-CM | POA: Diagnosis not present

## 2017-03-28 DIAGNOSIS — I429 Cardiomyopathy, unspecified: Secondary | ICD-10-CM | POA: Diagnosis not present

## 2017-03-28 DIAGNOSIS — I4891 Unspecified atrial fibrillation: Secondary | ICD-10-CM | POA: Diagnosis not present

## 2017-03-28 LAB — CUP PACEART REMOTE DEVICE CHECK
Battery Remaining Longevity: 90 mo
Brady Statistic AP VP Percent: 11.05 %
Brady Statistic AP VS Percent: 0.03 %
Brady Statistic AS VP Percent: 87.48 %
Brady Statistic AS VS Percent: 1.44 %
Brady Statistic RV Percent Paced: 91.34 %
Date Time Interrogation Session: 20181009063324
HIGH POWER IMPEDANCE MEASURED VALUE: 56 Ohm
HighPow Impedance: 72 Ohm
Implantable Lead Implant Date: 20080821
Implantable Lead Location: 753859
Implantable Lead Model: 6947
Lead Channel Impedance Value: 494 Ohm
Lead Channel Impedance Value: 589 Ohm
Lead Channel Impedance Value: 665 Ohm
Lead Channel Impedance Value: 950 Ohm
Lead Channel Pacing Threshold Amplitude: 0.625 V
Lead Channel Pacing Threshold Amplitude: 0.625 V
Lead Channel Pacing Threshold Amplitude: 0.625 V
Lead Channel Setting Pacing Amplitude: 1.25 V
Lead Channel Setting Pacing Amplitude: 2.5 V
Lead Channel Setting Pacing Pulse Width: 0.4 ms
MDC IDC LEAD IMPLANT DT: 20080821
MDC IDC LEAD IMPLANT DT: 20080821
MDC IDC LEAD LOCATION: 753858
MDC IDC LEAD LOCATION: 753860
MDC IDC MSMT BATTERY VOLTAGE: 2.99 V
MDC IDC MSMT LEADCHNL LV IMPEDANCE VALUE: 646 Ohm
MDC IDC MSMT LEADCHNL LV PACING THRESHOLD PULSEWIDTH: 0.4 ms
MDC IDC MSMT LEADCHNL RA IMPEDANCE VALUE: 456 Ohm
MDC IDC MSMT LEADCHNL RA PACING THRESHOLD PULSEWIDTH: 0.4 ms
MDC IDC MSMT LEADCHNL RA SENSING INTR AMPL: 2 mV
MDC IDC MSMT LEADCHNL RA SENSING INTR AMPL: 2 mV
MDC IDC MSMT LEADCHNL RV PACING THRESHOLD PULSEWIDTH: 0.4 ms
MDC IDC MSMT LEADCHNL RV SENSING INTR AMPL: 16.25 mV
MDC IDC MSMT LEADCHNL RV SENSING INTR AMPL: 16.25 mV
MDC IDC PG IMPLANT DT: 20170913
MDC IDC SET LEADCHNL LV PACING PULSEWIDTH: 0.4 ms
MDC IDC SET LEADCHNL RA PACING AMPLITUDE: 2 V
MDC IDC SET LEADCHNL RV SENSING SENSITIVITY: 0.3 mV
MDC IDC STAT BRADY RA PERCENT PACED: 10.48 %

## 2017-03-28 NOTE — Patient Instructions (Addendum)
Medication Instructions:  Your physician recommends that you continue on your current medications as directed. Please refer to the Current Medication list given to you today.  Labwork: TODAY:  BMET  Testing/Procedures: Your physician has requested that you have an echocardiogram IN [place]. Echocardiography is a painless test that uses sound waves to create images of your heart. It provides your doctor with information about the size and shape of your heart and how well your heart's chambers and valves are working. This procedure takes approximately one hour. There are no restrictions for this procedure.    Follow-Up: Your physician recommends that you schedule a follow-up appointment in: [place], NP   Any Other Special Instructions Will Be Listed Below (If Applicable). Echocardiogram An echocardiogram, or echocardiography, uses sound waves (ultrasound) to produce an image of your heart. The echocardiogram is simple, painless, obtained within a short period of time, and offers valuable information to your health care provider. The images from an echocardiogram can provide information such as:  Evidence of coronary artery disease (CAD).  Heart size.  Heart muscle function.  Heart valve function.  Aneurysm detection.  Evidence of a past heart attack.  Fluid buildup around the heart.  Heart muscle thickening.  Assess heart valve function.  Tell a health care provider about:  Any allergies you have.  All medicines you are taking, including vitamins, herbs, eye drops, creams, and over-the-counter medicines.  Any problems you or family members have had with anesthetic medicines.  Any blood disorders you have.  Any surgeries you have had.  Any medical conditions you have.  Whether you are pregnant or may be pregnant. What happens before the procedure? No special preparation is needed. Eat and drink normally. What happens during the  procedure?  In order to produce an image of your heart, gel will be applied to your chest and a wand-like tool (transducer) will be moved over your chest. The gel will help transmit the sound waves from the transducer. The sound waves will harmlessly bounce off your heart to allow the heart images to be captured in real-time motion. These images will then be recorded.  You may need an IV to receive a medicine that improves the quality of the pictures. What happens after the procedure? You may return to your normal schedule including diet, activities, and medicines, unless your health care provider tells you otherwise. This information is not intended to replace advice given to you by your health care provider. Make sure you discuss any questions you have with your health care provider. Document Released: 06/01/2000 Document Revised: 01/21/2016 Document Reviewed: 02/09/2013 Elsevier Interactive Patient Education  2017 [employer].    If you need a refill on your cardiac medications before your next appointment, please call your pharmacy.

## 2017-03-28 NOTE — Progress Notes (Signed)
Cardiology Office Note   Date:  03/28/2017   ID:  KAELEEN ODOM, DOB [birth year], MRN [id number]  PCP:  Maurice Small, MD  Cardiologist:  Dr. Marlou Porch    Chief Complaint  Patient presents with  . Leg Swelling    HF      History of Present Illness: Bianca [name] is a 83 y.o. female who presents for chronic systolic HF.  She has been having Optivol checked weekly and on current medications with metolazone once a week, the fluid she accumulates is diuresed with metolazone.    She has a h/o an ICM, chronic systolic CHF, LBBB, s/p BiV ICD implant. Hx of VT, CAD Chronic anticoagulation on Eliquis, , CKD-3, with EF 35%. Symptoms have been very stable. NYHA class II except for hospitalization for heart failure she was discharged on 11/24/12 with primary diagnosis of acute on chronic systolic heart failure. She was admitted with fluid overload, shortness of breath and diuresed successfully. Hx of cath 2008 for decreased LV systolic function. No CAD except for minor irregularities. EF at that time was 25-30%. Improved with BIV.   She is using CPAP.  She was hospitalized July 2018 with HF, EF < 20% on nuc study, + scar with prior MI,   No meaningful ischemia.  "Dr. Marlou Porch personally reviewed her previous angiogram and she had wide-open coronary arteries in 2008. She has had no chest pain episodes suggestive of old inferior infarct. Perhaps some of this was attenuation artifact?."   Recently seen in ER for fall.  No fractures.  Her walker tipped over and when pt went to pick it up she fell.  She then fell this past week, but no injury, this is due to neuropathy.  They have ordered her another walker that is stronger.  She did injure her Rt knee and has seen Dr. Alvan Dame and he injected it.  + ecchymosis on knee.   Her optivol readings have been good.   Today she has no complaints.  No SOB no chest pain.  Her wt on her scales continues to decrease overall.  Today at home she was 28.  She has wt  gain during the week but with her metolazone once a week the wt comes down.  She tells me she feels good.     Past Medical History:  Diagnosis Date  . Acute blood loss anemia   . Arthritis   . CAD (coronary artery disease)/LBBB 01/01/2017  . Chronic systolic heart failure ([facility])   . CKD (chronic kidney disease), stage III ([facility]) 12/22/2015  . Depression 11/24/2015  . Hypertension   . Hypokalemia   . Myocardial infarction ([facility]) 2008  . Neuromuscular disorder ([place])   . Paroxysmal atrial fibrillation ([facility])   . Physical deconditioning   . Right flank hematoma   . Sleep apnea     Past Surgical History:  Procedure Laterality Date  . ABDOMINAL HYSTERECTOMY    . ANKLE SURGERY    . CARDIAC CATHETERIZATION    . CHOLECYSTECTOMY    . EP IMPLANTABLE DEVICE N/A 02/29/2016   Procedure: BIV ICD Generator Changeout;  Surgeon: Evans Lance, MD;  Location: [facility];  Service: Cardiovascular;  Laterality: N/A;  . EYE SURGERY    . INSERT / REPLACE / REMOVE PACEMAKER  2008  . KNEE LIGAMENT RECONSTRUCTION  2012  . TONSILLECTOMY       Current Outpatient Prescriptions  Medication Sig Dispense Refill  . acetaminophen (TYLENOL) 325 MG tablet Take 650  mg by mouth 2 (two) times daily as needed for mild pain or headache.     . allopurinol (ZYLOPRIM) 100 MG tablet Take 100 mg by mouth See admin instructions. Takes along with a 300 mg tablet to equal 400 mg     . allopurinol (ZYLOPRIM) 300 MG tablet Take 300 mg by mouth See admin instructions. Take with the 100mg  tablet to equal 400mg  daily     . atorvastatin (LIPITOR) 10 MG tablet Take 1 tablet (10 mg total) by mouth daily at 6 PM. 90 tablet 3  . benzonatate (TESSALON) 200 MG capsule Take 200 mg by mouth 3 (three) times daily as needed for cough.  1  . bisoprolol (ZEBETA) 5 MG tablet Take 0.5 tablets (2.5 mg total) by mouth at bedtime. 45 tablet 3  . Calcium Carbonate-Vit D-Min (CALCIUM/VITAMIN D/MINERALS) 600-200 MG-UNIT TABS Take 1 tablet by  mouth daily.    . Cholecalciferol (VITAMIN D3) 1000 UNITS CAPS Take 1 capsule by mouth daily.    . colestipol (COLESTID) 1 G tablet Take 2 g by mouth at bedtime.     Marland Kitchen ELIQUIS 5 MG TABS tablet TAKE 1 TABLET TWICE DAILY 180 tablet 0  . gabapentin (NEURONTIN) 800 MG tablet Take 800 mg by mouth 3 (three) times daily.    Marland Kitchen guaiFENesin (MUCINEX) 600 MG 12 hr tablet Take 1 tablet (600 mg total) by mouth 2 (two) times daily.    . isosorbide mononitrate (IMDUR) 30 MG 24 hr tablet TAKE 1 TABLET BY MOUTH EVERY DAY 30 tablet 11  . metolazone (ZAROXOLYN) 2.5 MG tablet Take 1 tablet once a week on Monday. 4 tablet 3  . nitroGLYCERIN (NITROSTAT) 0.4 MG SL tablet Place 1 tablet (0.4 mg total) under the tongue every 5 (five) minutes as needed for chest pain. 25 tablet 6  . nortriptyline (PAMELOR) 25 MG capsule Take 25 mg by mouth at bedtime.    . OXYGEN Inhale 2 L/min into the lungs at bedtime as needed (uses as bedtime everynight and then occasionally iun the daytime as needed for SOB).     . potassium chloride SA (K-DUR,KLOR-CON) 20 MEQ tablet Take 2 tablets (40 mEq total) by mouth 2 (two) times daily. 360 tablet 3  . senna-docusate (SENOKOT-S) 8.6-50 MG tablet Take 1 tablet by mouth daily as needed for mild constipation.    Marland Kitchen spironolactone (ALDACTONE) 25 MG tablet Take 0.5 tablets (12.5 mg total) by mouth daily. 30 tablet 11  . torsemide (DEMADEX) 20 MG tablet Take 4 tablets (80 mg total) every morning and 2 tablets (40 mg total) six hours after morning dosage daily. 540 tablet 3   No current facility-administered medications for this visit.     Allergies:   Quinapril hcl; Tape; Tessalon [benzonatate]; and Neosporin [neomycin-polymyxin-gramicidin]    Social History:  The patient  reports that she has quit smoking. She has quit using smokeless tobacco. She reports that she does not drink alcohol or use drugs.   Family History:  The patient's family history includes Cancer in her brother; Congestive Heart  Failure in her mother; Diabetes in her brother; Heart attack in her son; Stroke in her brother and mother; Tuberculosis in her father.    ROS:  General:no colds or fevers, + weight decrease Skin:no rashes or ulcers HEENT:no blurred vision, no congestion CV:see HPI PUL:see HPI GI:no diarrhea constipation or melena, no indigestion GU:no hematuria, no dysuria MS:no joint pain, no claudication Neuro:no syncope, no lightheadedness Endo:no diabetes, no thyroid disease  Wt Readings  from Last 3 Encounters:  03/28/17 219 lb (99.3 kg)  02/19/17 224 lb 12.8 oz (102 kg)  01/15/17 233 lb 9.6 oz (106 kg)     PHYSICAL EXAM: VS:  BP 102/68   Pulse 81   Ht 5\' 6"  (1.676 m)   Wt 219 lb (99.3 kg)   LMP  (LMP Unknown)   SpO2 94%   BMI 35.35 kg/m  , BMI Body mass index is 35.35 kg/m. General:Pleasant affect, NAD Skin:Warm and dry, brisk capillary refill HEENT:normocephalic, sclera clear, mucus membranes moist Neck:supple, no JVD, no bruits  Heart:S1S2 RRR with soft systolic murmur, no gallup, rub or click Lungs:clear without rales, rhonchi, or wheezes GUR:KYHC, non tender, + BS, do not palpate liver spleen or masses Ext:no lower ext edema, + varocosities, 2+ radial pulses Neuro:alert and oriented X 3, MAE, follows commands, + facial symmetry    EKG:  EKG is ordered today. The ekg ordered today demonstrates SR with V pacing and premature beats.    Recent Labs: 01/01/2017: ALT 14; B Natriuretic Peptide 975.5 02/19/2017: BUN 36; Creatinine, Ser 1.20; Hemoglobin WILL FOLLOW; Platelets WILL FOLLOW; Potassium 3.6; Sodium 137    Lipid Panel    Component Value Date/Time   CHOL 158 07/20/2014 1149   TRIG 332.0 (H) 07/20/2014 1149   HDL 38.60 (L) 07/20/2014 1149   CHOLHDL 4 07/20/2014 1149   VLDL 66.4 (H) 07/20/2014 1149   LDLCALC 73 09/30/2013 0948   LDLDIRECT 80.0 07/20/2014 1149       Other studies Reviewed: Additional studies/ records that were reviewed today include: . Echo  01/02/17 Study Conclusions  - Left ventricle: The cavity size was severely dilated. There was   moderate concentric hypertrophy. Systolic function was severely   reduced. The estimated ejection fraction was in the range of 20%   to 25%. Diffuse hypokinesis. Features are consistent with a   pseudonormal left ventricular filling pattern, with concomitant   abnormal relaxation and increased filling pressure (grade 2   diastolic dysfunction). Doppler parameters are consistent with   elevated ventricular end-diastolic filling pressure. - Ventricular septum: Septal motion showed paradox. - Mitral valve: Calcified annulus. There was moderate   regurgitation. - Left atrium: The atrium was severely dilated. - Right ventricle: Systolic function was normal. - Right atrium: The atrium was normal in size. - Tricuspid valve: There was moderate regurgitation. - Pulmonic valve: There was no regurgitation. - Pulmonary arteries: Systolic pressure was moderately increased.   PA peak pressure: 46 mm Hg (S). - Pericardium, extracardiac: There was no pericardial effusion.  Impressions:  - When compared to the prior study from 10/26/2015, LVEF has   decreased from 50-55% to 20-25%.   Filling pressures are significantly elevated.   Moderate MR and TR.   Moderate pulmonary hypertension.   Nuc study 01/10/17 Study Highlights     The left ventricular ejection fraction is severely decreased (<30%).  Nuclear stress EF: 20%.  There was no ST segment deviation noted during stress.  Defect 1: There is a large defect of moderate severity present in the basal inferior, basal inferolateral, mid inferior, mid inferolateral and apical inferior location.  This is a high risk study.  Findings consistent with prior myocardial infarction.   High risk study due to the presence of a large inferolateral scar and severely depressed overall left ventricular systolic function. There is no meaningful reversible  ischemia identified.     ASSESSMENT AND PLAN:  1.  Chronic systolic HF, currently euvolemic, doing well on toresemide  80 AM and 40 mg pm daily and metolazone every Monday.  Will continue with this as overall her wt is decreasing.   Will check BMP to follow kidney function and K+ on her meds.  She is terrified of needing dialysis.  Next optivol check in 5 weeks and follow up in 8-10 weeks.    2...PAF, maintaining SR and BiV pacing.    3.   NICM will recheck echo to see if improved on meds.  4.   Chronic anticoagulation on Eliquis no GI or urinary bleeding, + ecchomosis of knee on rt after fall.    5.  Hx BiV ICD followed by EP.     Current medicines are reviewed with the patient today.  The patient Has no concerns regarding medicines.  The following changes have been made:  See above Labs/ tests ordered today include:see above  Disposition:   FU:  see above  Signed, [name], NP  03/28/2017 1:54 PM    [facility] [place], [place], [place] Phone: [phone]; Fax: [phone]

## 2017-03-29 ENCOUNTER — Encounter: Payer: Self-pay | Admitting: Cardiology

## 2017-03-29 LAB — BASIC METABOLIC PANEL
BUN / CREAT RATIO: 34 — AB (ref 12–28)
BUN: 44 mg/dL — AB (ref 8–27)
CO2: 25 mmol/L (ref 20–29)
CREATININE: 1.29 mg/dL — AB (ref 0.57–1.00)
Calcium: 9.8 mg/dL (ref 8.7–10.3)
Chloride: 95 mmol/L — ABNORMAL LOW (ref 96–106)
GFR calc Af Amer: 45 mL/min/{1.73_m2} — ABNORMAL LOW (ref >59)
GFR calc non Af Amer: 39 mL/min/{1.73_m2} — ABNORMAL LOW (ref >59)
GLUCOSE: 126 mg/dL — AB (ref 65–99)
Potassium: 3.7 mmol/L (ref 3.5–5.2)
Sodium: 139 mmol/L (ref 134–144)

## 2017-04-01 ENCOUNTER — Other Ambulatory Visit: Payer: Self-pay | Admitting: *Deleted

## 2017-04-01 DIAGNOSIS — I509 Heart failure, unspecified: Secondary | ICD-10-CM

## 2017-04-04 DIAGNOSIS — M25561 Pain in right knee: Secondary | ICD-10-CM | POA: Diagnosis not present

## 2017-04-04 DIAGNOSIS — M25562 Pain in left knee: Secondary | ICD-10-CM | POA: Diagnosis not present

## 2017-04-04 DIAGNOSIS — M170 Bilateral primary osteoarthritis of knee: Secondary | ICD-10-CM | POA: Diagnosis not present

## 2017-04-11 DIAGNOSIS — M170 Bilateral primary osteoarthritis of knee: Secondary | ICD-10-CM | POA: Diagnosis not present

## 2017-04-17 ENCOUNTER — Other Ambulatory Visit: Payer: PPO | Admitting: *Deleted

## 2017-04-17 DIAGNOSIS — I509 Heart failure, unspecified: Secondary | ICD-10-CM | POA: Diagnosis not present

## 2017-04-17 LAB — BASIC METABOLIC PANEL
BUN / CREAT RATIO: 22 (ref 12–28)
BUN: 27 mg/dL (ref 8–27)
CALCIUM: 9.7 mg/dL (ref 8.7–10.3)
CHLORIDE: 90 mmol/L — AB (ref 96–106)
CO2: 29 mmol/L (ref 20–29)
Creatinine, Ser: 1.23 mg/dL — ABNORMAL HIGH (ref 0.57–1.00)
GFR calc non Af Amer: 41 mL/min/{1.73_m2} — ABNORMAL LOW (ref >59)
GFR, EST AFRICAN AMERICAN: 47 mL/min/{1.73_m2} — AB (ref >59)
Glucose: 93 mg/dL (ref 65–99)
POTASSIUM: 3.8 mmol/L (ref 3.5–5.2)
Sodium: 139 mmol/L (ref 134–144)

## 2017-04-18 DIAGNOSIS — M25562 Pain in left knee: Secondary | ICD-10-CM | POA: Diagnosis not present

## 2017-04-18 DIAGNOSIS — M170 Bilateral primary osteoarthritis of knee: Secondary | ICD-10-CM | POA: Diagnosis not present

## 2017-04-18 DIAGNOSIS — M25561 Pain in right knee: Secondary | ICD-10-CM | POA: Diagnosis not present

## 2017-04-26 DIAGNOSIS — M1711 Unilateral primary osteoarthritis, right knee: Secondary | ICD-10-CM | POA: Diagnosis not present

## 2017-04-29 ENCOUNTER — Ambulatory Visit (INDEPENDENT_AMBULATORY_CARE_PROVIDER_SITE_OTHER): Payer: PPO

## 2017-04-29 DIAGNOSIS — I5022 Chronic systolic (congestive) heart failure: Secondary | ICD-10-CM | POA: Diagnosis not present

## 2017-04-29 DIAGNOSIS — Z95810 Presence of automatic (implantable) cardiac defibrillator: Secondary | ICD-10-CM

## 2017-04-30 NOTE — Progress Notes (Signed)
EPIC Encounter for ICM Monitoring  Patient Name: Bianca [name] is a 83 y.o. female Date: 04/30/2017 Primary Care Physican: Maurice Small, MD Primary Cardiologist:Skains/Laura Dorene Ar, NP Electrophysiologist: Lovena Le Dry Weight:214lbs today  Bi-V Pacing: 94.9%      Spoke with daughter in law.  Heart Failure questions reviewed, pt asymptomatic.   Thoracic impedance normal.  Prescribed dosage: Torsemide 20 mg 4 tablets (80 mg total) every morning and 2 tablets (40 mg total) six hours after morning dosage daily. Potassium 20 mEq 2 tablets twice a day. Metolazone 2.5 mg 1 tablet weekly on Monday.  Labs: 02/19/2017 Creatinine 1.20, BUN 36, Potassium 3.6, Sodium 137, EGFR 42-49 01/08/2017 Creatinine 1.01, BUN 17, Potassium 4.5, Sodium 142, EGFR 52-60  01/04/2017 Creatinine 1.09, BUN 16, Potassium 4.0, Sodium 139, EGFR 46-53  01/03/2017 Creatinine 1.13, BUN 18, Potassium 4.2, Sodium 142, EGFR 44-51  01/02/2017 Creatinine 1.17, BUN 18, Potassium 3.7, Sodium 141, EGFR 42-49  01/01/2017 Creatinine 1.10, BUN 17, Potassium 4.1, Sodium 140, EGFR 45-53  Recommendations: No changes.  Encouraged to call for fluid symptoms.  Follow-up plan: ICM clinic phone appointment on 06/06/2017.  Office appointment scheduled 06/07/2017 with [name], NP.  Copy of ICM check sent to Dr. Lovena Le.   3 month ICM trend: 04/29/2017    1 Year ICM trend:       Rosalene Billings, RN 04/30/2017 1:05 PM   \

## 2017-05-01 ENCOUNTER — Other Ambulatory Visit: Payer: Self-pay

## 2017-05-01 ENCOUNTER — Ambulatory Visit (HOSPITAL_COMMUNITY): Payer: PPO | Attending: Cardiology

## 2017-05-01 DIAGNOSIS — I5022 Chronic systolic (congestive) heart failure: Secondary | ICD-10-CM

## 2017-05-01 DIAGNOSIS — Z95810 Presence of automatic (implantable) cardiac defibrillator: Secondary | ICD-10-CM

## 2017-05-01 DIAGNOSIS — I4891 Unspecified atrial fibrillation: Secondary | ICD-10-CM | POA: Diagnosis not present

## 2017-05-01 DIAGNOSIS — I340 Nonrheumatic mitral (valve) insufficiency: Secondary | ICD-10-CM | POA: Insufficient documentation

## 2017-05-01 DIAGNOSIS — I429 Cardiomyopathy, unspecified: Secondary | ICD-10-CM | POA: Diagnosis not present

## 2017-06-03 ENCOUNTER — Other Ambulatory Visit: Payer: Self-pay

## 2017-06-03 DIAGNOSIS — I5022 Chronic systolic (congestive) heart failure: Secondary | ICD-10-CM

## 2017-06-03 MED ORDER — METOLAZONE 2.5 MG PO TABS: ORAL_TABLET | ORAL | 6 refills | Status: DC

## 2017-06-05 ENCOUNTER — Other Ambulatory Visit (HOSPITAL_COMMUNITY): Payer: Self-pay | Admitting: Cardiology

## 2017-06-06 ENCOUNTER — Ambulatory Visit (INDEPENDENT_AMBULATORY_CARE_PROVIDER_SITE_OTHER): Payer: PPO

## 2017-06-06 DIAGNOSIS — Z95810 Presence of automatic (implantable) cardiac defibrillator: Secondary | ICD-10-CM | POA: Diagnosis not present

## 2017-06-06 DIAGNOSIS — I5022 Chronic systolic (congestive) heart failure: Secondary | ICD-10-CM

## 2017-06-06 NOTE — Progress Notes (Signed)
Cardiology Office Note   Date:  06/07/2017   ID:  Bianca, [name] December 18, 1932, MRN [id number]  PCP:  Maurice Small, MD  Cardiologist:  Dr. Marlou Porch     Chief Complaint  Patient presents with  . Congestive Heart Failure      History of Present Illness: Bianca [name] is a 83 y.o. female who presents for chronic systolic HF.    She has a h/o an ICM, chronic systolic CHF, LBBB, s/p BiV ICD implant. Hx of VT, CAD Chronic anticoagulation on Eliquis, , CKD-3, with EF 35%. Symptoms have been very stable. NYHA class II except for hospitalization for heart failure she was discharged on 11/24/12 with primary diagnosis of acute on chronic systolic heart failure. She was admitted with fluid overload, shortness of breath and diuresed successfully. Hx of cath 2008 for decreased LV systolic function. No CAD except for minor irregularities. EF at that time was 25-30%. Improved with BIV.   She is using CPAP.  She was hospitalized July 2018 with HF, EF < 20% on nuc study, + scar with prior MI,   No meaningful ischemia.  "Dr. Marlou Porch personally reviewed her previous angiogram and she had wide-open coronary arteries in 2008. She has had no chest pain episodes suggestive of old inferior infarct. Perhaps some of this was attenuation artifact?."   Recently seen in ER for fall.  No fractures.  Her walker tipped over and when pt went to pick it up she fell.  She then fell this past week, but no injury, this is due to neuropathy.  They have ordered her another walker that is stronger.  She did injure her Rt knee and has seen Dr. Alvan Dame and he injected it.  + ecchymosis on knee.   Her optivol readings have been good. And one sent last pm reveals a rise in optivol by Sat. and improvement after Monday  Her wt climbs with this.  She has been better controlled on addition of metolazone    Today she is doing well, only one episode of SOB -no severe weakness.  No chest pain.  The one episode of SOB lasted 30  min.  Overall her wt is decreasing.  She weighs every AM and call family and on Mondays they remind her to take metolazone.    Past Medical History:  Diagnosis Date  . Acute blood loss anemia   . Arthritis   . CAD (coronary artery disease)/LBBB 01/01/2017  . Chronic systolic heart failure ([place])   . CKD (chronic kidney disease), stage III ([place]) 12/22/2015  . Depression 11/24/2015  . Hypertension   . Hypokalemia   . Myocardial infarction ([place]) 2008  . Neuromuscular disorder ([place])   . Paroxysmal atrial fibrillation ([facility])   . Physical deconditioning   . Right flank hematoma   . Sleep apnea     Past Surgical History:  Procedure Laterality Date  . ABDOMINAL HYSTERECTOMY    . ANKLE SURGERY    . CARDIAC CATHETERIZATION    . CHOLECYSTECTOMY    . EP IMPLANTABLE DEVICE N/A 02/29/2016   Procedure: BIV ICD Generator Changeout;  Surgeon: Evans Lance, MD;  Location: [facility];  Service: Cardiovascular;  Laterality: N/A;  . EYE SURGERY    . INSERT / REPLACE / REMOVE PACEMAKER  2008  . KNEE LIGAMENT RECONSTRUCTION  2012  . TONSILLECTOMY       Current Outpatient Medications  Medication Sig Dispense Refill  . acetaminophen (TYLENOL) 325 MG tablet Take 650  mg by mouth 2 (two) times daily as needed for mild pain or headache.     . allopurinol (ZYLOPRIM) 100 MG tablet Take 100 mg by mouth See admin instructions. Takes along with a 300 mg tablet to equal 400 mg     . allopurinol (ZYLOPRIM) 300 MG tablet Take 300 mg by mouth See admin instructions. Take with the 100mg  tablet to equal 400mg  daily     . atorvastatin (LIPITOR) 10 MG tablet Take 1 tablet (10 mg total) by mouth daily at 6 PM. 90 tablet 3  . benzonatate (TESSALON) 200 MG capsule Take 200 mg by mouth 3 (three) times daily as needed for cough.  1  . bisoprolol (ZEBETA) 5 MG tablet Take 0.5 tablets (2.5 mg total) by mouth at bedtime. 45 tablet 3  . Calcium Carbonate-Vit D-Min (CALCIUM/VITAMIN D/MINERALS) 600-200 MG-UNIT TABS Take 1  tablet by mouth daily.    . Cholecalciferol (VITAMIN D3) 1000 UNITS CAPS Take 1 capsule by mouth daily.    . colestipol (COLESTID) 1 G tablet Take 2 g by mouth at bedtime.     . gabapentin (NEURONTIN) 800 MG tablet Take 800 mg by mouth 3 (three) times daily.    Marland Kitchen guaiFENesin (MUCINEX) 600 MG 12 hr tablet Take 1 tablet (600 mg total) by mouth 2 (two) times daily.    . isosorbide mononitrate (IMDUR) 30 MG 24 hr tablet TAKE 1 TABLET BY MOUTH EVERY DAY 30 tablet 11  . nitroGLYCERIN (NITROSTAT) 0.4 MG SL tablet Place 1 tablet (0.4 mg total) under the tongue every 5 (five) minutes as needed for chest pain. 25 tablet 6  . nortriptyline (PAMELOR) 25 MG capsule Take 25 mg by mouth at bedtime.    . OXYGEN Inhale 2 L/min into the lungs at bedtime as needed (uses as bedtime everynight and then occasionally iun the daytime as needed for SOB).     . potassium chloride SA (K-DUR,KLOR-CON) 20 MEQ tablet Take 2 tablets (40 mEq total) by mouth 2 (two) times daily. 360 tablet 3  . senna-docusate (SENOKOT-S) 8.6-50 MG tablet Take 1 tablet by mouth daily as needed for mild constipation.    Marland Kitchen spironolactone (ALDACTONE) 25 MG tablet Take 0.5 tablets (12.5 mg total) by mouth daily. 30 tablet 11  . torsemide (DEMADEX) 20 MG tablet Take 4 tablets (80 mg total) every morning and 2 tablets (40 mg total) six hours after morning dosage daily. 540 tablet 3  . ELIQUIS 5 MG TABS tablet TAKE 1 TABLET TWICE DAILY 180 tablet 1  . metolazone (ZAROXOLYN) 2.5 MG tablet Take 1 tablet (2.5 mg total) by mouth once a week. 1 tab every Monday 24 tablet 3   No current facility-administered medications for this visit.     Allergies:   Quinapril hcl; Tape; Tessalon [benzonatate]; and Neosporin [neomycin-polymyxin-gramicidin]    Social History:  The patient  reports that she has quit smoking. She has quit using smokeless tobacco. She reports that she does not drink alcohol or use drugs.   Family History:  The patient's family history  includes Cancer in her brother; Congestive Heart Failure in her mother; Diabetes in her brother; Heart attack in her son; Stroke in her brother and mother; Tuberculosis in her father.    ROS:  General:no colds or fevers, continual wt loss  Skin:no rashes or ulcers HEENT:no blurred vision, no congestion CV:see HPI PUL:see HPI GI:no diarrhea constipation or melena, no indigestion GU:no hematuria, no dysuria no bleeding on Eliquis MS:no joint pain, no claudication  Neuro:no syncope, no lightheadedness Endo:no diabetes, no thyroid disease  Wt Readings from Last 3 Encounters:  06/07/17 217 lb (98.4 kg)  03/28/17 219 lb (99.3 kg)  02/19/17 224 lb 12.8 oz (102 kg)     PHYSICAL EXAM: VS:  BP 103/69   Pulse 86   Ht 5\' 6"  (1.676 m)   Wt 217 lb (98.4 kg)   LMP  (LMP Unknown)   SpO2 (!) 1%   BMI 35.02 kg/m  , BMI Body mass index is 35.02 kg/m. BP was low initially but now improved, asymptomatic General:Pleasant affect, NAD Skin:Warm and dry, brisk capillary refill HEENT:normocephalic, sclera clear, mucus membranes moist Neck:supple, no JVD, no bruits  Heart:S1S2 RRR with soft sytolic murmur, no gallup, rub or click Lungs:clear without rales, rhonchi, or wheezes ZOX:WRUE, non tender, + BS, do not palpate liver spleen or masses Ext:no lower ext edema, 2+ pedal pulses, 2+ radial pulses Neuro:alert and oriented X 3, MAE, follows commands, + facial symmetry    EKG:  EKG is ordered today. The ekg ordered today demonstrates BiV pacing with PVCs and PACs.   Recent Labs: 01/01/2017: ALT 14; B Natriuretic Peptide 975.5 02/19/2017: Hemoglobin WILL FOLLOW; Platelets WILL FOLLOW 06/07/2017: BUN 27; Creatinine, Ser 0.98; Potassium 3.9; Sodium 142    Lipid Panel    Component Value Date/Time   CHOL 158 07/20/2014 1149   TRIG 332.0 (H) 07/20/2014 1149   HDL 38.60 (L) 07/20/2014 1149   CHOLHDL 4 07/20/2014 1149   VLDL 66.4 (H) 07/20/2014 1149   LDLCALC 73 09/30/2013 0948   LDLDIRECT 80.0  07/20/2014 1149       Other studies Reviewed: Additional studies/ records that were reviewed today include: . Echo 05/01/17 Study Conclusions  - Left ventricle: The cavity size was severely dilated. Wall   thickness was normal. The estimated ejection fraction was 20%.   Diffuse hypokinesis. Features are consistent with a pseudonormal   left ventricular filling pattern, with concomitant abnormal   relaxation and increased filling pressure (grade 2 diastolic   dysfunction). E/medial e&' > 15 suggests LV end diastolic pressure   at least 20 mmHg. - Aortic valve: There was no stenosis. - Mitral valve: Moderately calcified annulus. There was moderate   central regurgitation. - Left atrium: The atrium was moderately to severely dilated. - Right ventricle: The cavity size was normal. Pacer wire or   catheter noted in right ventricle. Systolic function was mildly   reduced. - Tricuspid valve: Peak RV-RA gradient (S): 24 mm Hg. - Pulmonary arteries: PA peak pressure: 27 mm Hg (S). - Inferior vena cava: The vessel was normal in size. The   respirophasic diameter changes were in the normal range (= 50%),   consistent with normal central venous pressure.  Impressions:  - Severely dilated LV with severe diffuse hypokinesis, EF 20%.   Moderate diastolic dysfunction with evidence for elevated LV   filling pressure. Normal RV size with mildly decreased systolic   function. Moderate central mitral regurgitation, likely   functional MR.  ------------------------------------------------------------------- Nuc study 01/10/17 Study Highlights     The left ventricular ejection fraction is severely decreased (<30%).  Nuclear stress EF: 20%.  There was no ST segment deviation noted during stress.  Defect 1: There is a large defect of moderate severity present in the basal inferior, basal inferolateral, mid inferior, mid inferolateral and apical inferior location.  This is a high risk  study.  Findings consistent with prior myocardial infarction.  High risk study due to the presence  of a large inferolateral scar and severely depressed overall left ventricular systolic function. There is no meaningful reversible ischemia identified.     ASSESSMENT AND PLAN:  1.  Chronic systolic HF, table continue on torsemide 80 mg every AM and 40 every  Pm and metolazone every Monday though will increase metolazone with one extra dose each week if she becomes SOB.  There is balance between her SOB./edema and BP.  She has not had any recent falls.    2.  PAF -SR with BiV pacing and PACs and PVc will check BMP today to eval kidney function and K+  3.  NICM no change with recent echo.  Continue monthly optivol checks until sees Dr. Marlou Porch then may be able to back off to 3 months.  Follow up with Dr. Marlou Porch in 2 months   4.  Chronic anticoagulation on Eliquis, no bleeding.    5.  Hx BiV ICD followed by EP.       Current medicines are reviewed with the patient today.  The patient Has no concerns regarding medicines.  The following changes have been made:  See above Labs/ tests ordered today include:see above  Disposition:   FU:  see above  Signed, [name], NP  06/07/2017 6:36 PM    [facility] [address], [place], [place] Phone: [phone]; Fax: [phone]

## 2017-06-07 ENCOUNTER — Encounter: Payer: Self-pay | Admitting: Cardiology

## 2017-06-07 ENCOUNTER — Ambulatory Visit: Payer: PPO | Admitting: Cardiology

## 2017-06-07 VITALS — BP 103/69 | HR 86 | Ht 66.0 in | Wt 217.0 lb

## 2017-06-07 DIAGNOSIS — Z7901 Long term (current) use of anticoagulants: Secondary | ICD-10-CM | POA: Diagnosis not present

## 2017-06-07 DIAGNOSIS — I5032 Chronic diastolic (congestive) heart failure: Secondary | ICD-10-CM | POA: Diagnosis not present

## 2017-06-07 DIAGNOSIS — I481 Persistent atrial fibrillation: Secondary | ICD-10-CM | POA: Diagnosis not present

## 2017-06-07 DIAGNOSIS — I4819 Other persistent atrial fibrillation: Secondary | ICD-10-CM

## 2017-06-07 DIAGNOSIS — I10 Essential (primary) hypertension: Secondary | ICD-10-CM | POA: Diagnosis not present

## 2017-06-07 DIAGNOSIS — I429 Cardiomyopathy, unspecified: Secondary | ICD-10-CM | POA: Diagnosis not present

## 2017-06-07 DIAGNOSIS — I5022 Chronic systolic (congestive) heart failure: Secondary | ICD-10-CM

## 2017-06-07 DIAGNOSIS — Z95810 Presence of automatic (implantable) cardiac defibrillator: Secondary | ICD-10-CM

## 2017-06-07 LAB — BASIC METABOLIC PANEL
BUN/Creatinine Ratio: 28 (ref 12–28)
BUN: 27 mg/dL (ref 8–27)
CALCIUM: 9.8 mg/dL (ref 8.7–10.3)
CHLORIDE: 97 mmol/L (ref 96–106)
CO2: 29 mmol/L (ref 20–29)
Creatinine, Ser: 0.98 mg/dL (ref 0.57–1.00)
GFR calc non Af Amer: 54 mL/min/{1.73_m2} — ABNORMAL LOW (ref >59)
GFR, EST AFRICAN AMERICAN: 62 mL/min/{1.73_m2} (ref >59)
GLUCOSE: 84 mg/dL (ref 65–99)
POTASSIUM: 3.9 mmol/L (ref 3.5–5.2)
Sodium: 142 mmol/L (ref 134–144)

## 2017-06-07 MED ORDER — METOLAZONE 2.5 MG PO TABS: 2.5000 mg | ORAL_TABLET | ORAL | 3 refills | Status: DC

## 2017-06-07 MED ORDER — METOLAZONE 2.5 MG PO TABS: 2.5000 mg | ORAL_TABLET | ORAL | 0 refills | Status: DC

## 2017-06-07 NOTE — Patient Instructions (Addendum)
Medication Instructions:  1. METOLAZONE 2.5 MG EVERY Monday; YOU MAY TAKE 1 EXTRA TABLET AS NEEDED FOR INCREASED SHORTNESS OF BREATH  Labwork: BMET TODAY  Testing/Procedures: NONE ORDERED TODAY  Follow-Up: 07/18/17 @ 8 AM WITH DR. Marlou Porch   Any Other Special Instructions Will Be Listed Below (If Applicable).     If you need a refill on your cardiac medications before your next appointment, please call your pharmacy.

## 2017-06-07 NOTE — Progress Notes (Signed)
EPIC Encounter for ICM Monitoring  Patient Name: Bianca [name] is a 83 y.o. female Date: 06/07/2017 Primary Care Physican: Maurice Small, MD Primary Cardiologist:Skains/Laura Dorene Ar, NP Electrophysiologist: Druscilla Brownie Weight:Previous weight 214lbs today  Bi-V Pacing: 94.9%                Transmission reviewed and spoke with daughter in law while at office appointment today.   Thoracic impedance normal at time of transmission but does have a pattern of being abnormal suggesting fluid accumulation for a few days and then goes back to baseline.  Prescribed dosage: Torsemide 20 mg 4 tablets (80 mg total) every morning and 2 tablets (40 mg total) six hours after morning dosage daily. Potassium 20 mEq 2 tablets twice a day. Metolazone 2.5 mg 1 tablet weekly on Monday.  Labs: 02/19/2017 Creatinine 1.20, BUN 36, Potassium 3.6, Sodium 137, EGFR 42-49 01/08/2017 Creatinine 1.01, BUN 17, Potassium 4.5, Sodium 142, EGFR 52-60  01/04/2017 Creatinine 1.09, BUN 16, Potassium 4.0, Sodium 139, EGFR 46-53  01/03/2017 Creatinine 1.13, BUN 18, Potassium 4.2, Sodium 142, EGFR 44-51  01/02/2017 Creatinine 1.17, BUN 18, Potassium 3.7, Sodium 141, EGFR 42-49  01/01/2017 Creatinine 1.10, BUN 17, Potassium 4.1, Sodium 140, EGFR 45-53  Recommendations: No changes.    Follow-up plan: ICM clinic phone appointment on 07/08/2017.   Copy of ICM check sent to Dr. Lovena Le and [name], NP.   3 month ICM trend: 06/06/2017    1 Year ICM trend:       Rosalene Billings, RN 06/07/2017 11:07 AM

## 2017-06-24 ENCOUNTER — Telehealth: Payer: Self-pay

## 2017-06-24 NOTE — Telephone Encounter (Signed)
Received call from daughter in law, Jericho Cieslik.  She said patient needs a new script of Torsemide because she is still trying to refill a script that was written in the hospital several months ago but does not have the dosage she is taking now which is 4 tablets in AM and 2 tablets in PM.  Advised a new script was sent to [facility] on 02/11/2017 for the dosage and at that time it was not filled because patient had enough supply on hand.  Advised to call [facility]  and ask for script that was sent on 02/11/2017.  She said she will contact [facility].

## 2017-06-27 ENCOUNTER — Other Ambulatory Visit (HOSPITAL_COMMUNITY): Payer: Self-pay | Admitting: Adult Health

## 2017-07-07 ENCOUNTER — Other Ambulatory Visit (HOSPITAL_COMMUNITY): Payer: Self-pay | Admitting: Adult Health

## 2017-07-08 ENCOUNTER — Ambulatory Visit (INDEPENDENT_AMBULATORY_CARE_PROVIDER_SITE_OTHER): Payer: PPO | Admitting: *Deleted

## 2017-07-08 DIAGNOSIS — I429 Cardiomyopathy, unspecified: Secondary | ICD-10-CM

## 2017-07-08 DIAGNOSIS — Z95810 Presence of automatic (implantable) cardiac defibrillator: Secondary | ICD-10-CM

## 2017-07-08 DIAGNOSIS — I5022 Chronic systolic (congestive) heart failure: Secondary | ICD-10-CM

## 2017-07-08 NOTE — Progress Notes (Signed)
Remote ICD transmission.   

## 2017-07-08 NOTE — Progress Notes (Signed)
EPIC Encounter for ICM Monitoring  Patient Name: Bianca [name] is a 84 y.o. female Date: 07/08/2017 Primary Care Physican: Maurice Small, MD Primary Cardiologist:Skains/Laura Dorene Ar, NP Electrophysiologist: Lovena Le Dry Weight:210lbs today  Bi-V Pacing: 92.3%      Spoke with daughter in law, Rhianna Raulerson.  Heart Failure questions reviewed, pt's weight fluctuates from about 208 to 214 and is at the lowest on Mondays when she takes Metolazone.  No breathing difficulties.    Thoracic impedance has a pattern of decreased impedance for 2-3 (suggesting fluid accumulation) and back to baseline for a day.   Prescribed dosage: Torsemide 20 mg 4 tablets (80 mg total) every morning and 2 tablets (40 mg total) six hours after morning dosage daily. Potassium 20 mEq 2 tablets twice a day. Metolazone 2.5 mg 1 tablet weekly on Monday.  Labs: 02/19/2017 Creatinine 1.20, BUN 36, Potassium 3.6, Sodium 137, EGFR 42-49 01/08/2017 Creatinine 1.01, BUN 17, Potassium 4.5, Sodium 142, EGFR 52-60  01/04/2017 Creatinine 1.09, BUN 16, Potassium 4.0, Sodium 139, EGFR 46-53  01/03/2017 Creatinine 1.13, BUN 18, Potassium 4.2, Sodium 142, EGFR 44-51  01/02/2017 Creatinine 1.17, BUN 18, Potassium 3.7, Sodium 141, EGFR 42-49  01/01/2017 Creatinine 1.10, BUN 17, Potassium 4.1, Sodium 140, EGFR 45-53  Recommendations: No changes.    Encouraged to call for fluid symptoms.  Follow-up plan: ICM clinic phone appointment on 08/08/2017.  Office appointment scheduled 07/18/2017 with Dr. Marlou Porch.  Copy of ICM check sent to Dr. Lovena Le and Dr. Marlou Porch.   3 month ICM trend: 07/08/2017    1 Year ICM trend:       Rosalene Billings, RN 07/08/2017 1:47 PM

## 2017-07-09 ENCOUNTER — Telehealth: Payer: Self-pay

## 2017-07-09 ENCOUNTER — Emergency Department (HOSPITAL_COMMUNITY): Payer: PPO

## 2017-07-09 ENCOUNTER — Emergency Department (HOSPITAL_COMMUNITY)
Admission: EM | Admit: 2017-07-09 | Discharge: 2017-07-09 | Disposition: A | Discharge status: Home / Self Care | Payer: PPO | Attending: Emergency Medicine | Admitting: Emergency Medicine

## 2017-07-09 ENCOUNTER — Encounter (HOSPITAL_COMMUNITY): Payer: Self-pay | Admitting: Emergency Medicine

## 2017-07-09 ENCOUNTER — Encounter: Payer: Self-pay | Admitting: Cardiology

## 2017-07-09 DIAGNOSIS — I5022 Chronic systolic (congestive) heart failure: Secondary | ICD-10-CM | POA: Diagnosis not present

## 2017-07-09 DIAGNOSIS — Z7901 Long term (current) use of anticoagulants: Secondary | ICD-10-CM | POA: Diagnosis not present

## 2017-07-09 DIAGNOSIS — I130 Hypertensive heart and chronic kidney disease with heart failure and stage 1 through stage 4 chronic kidney disease, or unspecified chronic kidney disease: Secondary | ICD-10-CM | POA: Diagnosis not present

## 2017-07-09 DIAGNOSIS — Z87891 Personal history of nicotine dependence: Secondary | ICD-10-CM | POA: Diagnosis not present

## 2017-07-09 DIAGNOSIS — I2510 Atherosclerotic heart disease of native coronary artery without angina pectoris: Secondary | ICD-10-CM | POA: Diagnosis not present

## 2017-07-09 DIAGNOSIS — N3000 Acute cystitis without hematuria: Secondary | ICD-10-CM

## 2017-07-09 DIAGNOSIS — R4182 Altered mental status, unspecified: Secondary | ICD-10-CM | POA: Diagnosis present

## 2017-07-09 DIAGNOSIS — Z79899 Other long term (current) drug therapy: Secondary | ICD-10-CM | POA: Insufficient documentation

## 2017-07-09 DIAGNOSIS — N183 Chronic kidney disease, stage 3 (moderate): Secondary | ICD-10-CM | POA: Diagnosis not present

## 2017-07-09 DIAGNOSIS — E876 Hypokalemia: Secondary | ICD-10-CM | POA: Insufficient documentation

## 2017-07-09 DIAGNOSIS — R410 Disorientation, unspecified: Secondary | ICD-10-CM | POA: Diagnosis not present

## 2017-07-09 DIAGNOSIS — Z8679 Personal history of other diseases of the circulatory system: Secondary | ICD-10-CM | POA: Diagnosis not present

## 2017-07-09 LAB — URINALYSIS, ROUTINE W REFLEX MICROSCOPIC
BILIRUBIN URINE: NEGATIVE
Glucose, UA: NEGATIVE mg/dL
HGB URINE DIPSTICK: NEGATIVE
KETONES UR: NEGATIVE mg/dL
Nitrite: NEGATIVE
PH: 6 (ref 5.0–8.0)
Protein, ur: NEGATIVE mg/dL
Specific Gravity, Urine: 1.006 (ref 1.005–1.030)

## 2017-07-09 LAB — COMPREHENSIVE METABOLIC PANEL
ALBUMIN: 4.2 g/dL (ref 3.5–5.0)
ALK PHOS: 63 U/L (ref 38–126)
ALT: 14 U/L (ref 14–54)
AST: 22 U/L (ref 15–41)
Anion gap: 15 (ref 5–15)
BILIRUBIN TOTAL: 1.4 mg/dL — AB (ref 0.3–1.2)
BUN: 23 mg/dL — ABNORMAL HIGH (ref 6–20)
CHLORIDE: 97 mmol/L — AB (ref 101–111)
CO2: 28 mmol/L (ref 22–32)
Calcium: 10.1 mg/dL (ref 8.9–10.3)
Creatinine, Ser: 1.08 mg/dL — ABNORMAL HIGH (ref 0.44–1.00)
GFR calc Af Amer: 54 mL/min — ABNORMAL LOW (ref >60)
GFR, EST NON AFRICAN AMERICAN: 46 mL/min — AB (ref >60)
Glucose, Bld: 130 mg/dL — ABNORMAL HIGH (ref 65–99)
Potassium: 3.3 mmol/L — ABNORMAL LOW (ref 3.5–5.1)
Sodium: 140 mmol/L (ref 135–145)
Total Protein: 7.1 g/dL (ref 6.5–8.1)

## 2017-07-09 LAB — DIFFERENTIAL
BASOS PCT: 1 %
Basophils Absolute: 0 10*3/uL (ref 0.0–0.1)
EOS ABS: 0.4 10*3/uL (ref 0.0–0.7)
EOS PCT: 5 %
Lymphocytes Relative: 23 %
Lymphs Abs: 2 10*3/uL (ref 0.7–4.0)
MONO ABS: 0.6 10*3/uL (ref 0.1–1.0)
MONOS PCT: 7 %
Neutro Abs: 5.8 10*3/uL (ref 1.7–7.7)
Neutrophils Relative %: 66 %

## 2017-07-09 LAB — CBC
HCT: 41.3 % (ref 36.0–46.0)
HEMOGLOBIN: 13.8 g/dL (ref 12.0–15.0)
MCH: 33 pg (ref 26.0–34.0)
MCHC: 33.4 g/dL (ref 30.0–36.0)
MCV: 98.8 fL (ref 78.0–100.0)
Platelets: 209 10*3/uL (ref 150–400)
RBC: 4.18 MIL/uL (ref 3.87–5.11)
RDW: 14.7 % (ref 11.5–15.5)
WBC: 9.2 10*3/uL (ref 4.0–10.5)

## 2017-07-09 MED ORDER — CEPHALEXIN 250 MG PO CAPS
500.0000 mg | ORAL_CAPSULE | Freq: Once | ORAL | Status: AC
  Administered 2017-07-09: 500 mg via ORAL
  Filled 2017-07-09: qty 2

## 2017-07-09 MED ORDER — CEPHALEXIN 500 MG PO CAPS: 500.0000 mg | ORAL_CAPSULE | Freq: Two times a day (BID) | ORAL | 0 refills | Status: AC

## 2017-07-09 NOTE — ED Notes (Addendum)
Patient transported to X-ray from the waiting room. Pt will be brought back to the room, after X-ray. Pt's family is at bedside.

## 2017-07-09 NOTE — ED Triage Notes (Signed)
Pt arrives with family from [facility] home for confusion that has been on going since Saturday. Family states that she has been asking about her decreased husband and not going down to breakfast. That's what prompted staff to call family due to her not coming to breakfast.  Pt is alert and oriented to person place and time. Pt complains of lower back pain as well.

## 2017-07-09 NOTE — ED Notes (Signed)
Patient verbalizes understanding of discharge instructions. Opportunity for questioning and answers were provided. Armband removed by staff, pt discharged from ED via wheelchair.  

## 2017-07-09 NOTE — Discharge Instructions (Signed)
Your testing shows that you have a urinary tract infection.  Please take cephalexin 500 mg by mouth twice a day with food and water, you should also be taking a probiotic to help prevent infectious colitis.  Please follow-up with your doctor within the next 48 hours for recheck if no better, emergency department for worsening mental status, confusion, fevers vomiting or pain

## 2017-07-09 NOTE — Telephone Encounter (Signed)
Attempted return call to daughter in law Jalise Zawistowski as requested by voice mail message.

## 2017-07-09 NOTE — ED Provider Notes (Signed)
Patient placed in Quick Look pathway, seen and evaluated   Chief Complaint: confusion  HPI:   84 year old female presents today with family who reports some confusion.  They note over the last several days patient has been slightly confused, stating she is somewhere that she is not.  Denies any difficulty speaking, acute neurological deficits, recent falls, or recent infectious etiology.  No history of stroke previously, similar behavior in the past with urinary tract infections  ROS: Confusion (one)  Physical Exam:   Gen: No distress  Neuro: Awake and Alert  Skin: Warm    Focused Exam: Cardiac regular rate and rhythm; neurologic: Cranial nerves intact NIH 0   Initiation of care has begun. The patient has been counseled on the process, plan, and necessity for staying for the completion/evaluation, and the remainder of the medical screening examination    Francee Gentile 07/09/17 Malcom, Julie, MD 07/09/17 1410

## 2017-07-09 NOTE — Telephone Encounter (Signed)
Daughter in law returned call. She is concerned because patient has had times of confusion for the last 2-3 days.  Patient thought a phone call from the granddaughter was her mother that has been deceased for many years.  Also patient thought a female friend that came to her room was a female relative and in her bra and panties which is out of character for patient.  There have been other incidences of confusion.  Asked if patient has history of UTI since that can cause confusion and she said no.   Patient has been very sleepy and saying she has not been sleeping last several nights.  She is taking Melatonin at night for sleep.  DIL unsure what physician should be called and advised to call PCP today.  Advised to go to ER if symptoms are urgent or cannot get PCP appointment today.  She verbalized understanding.

## 2017-07-09 NOTE — ED Provider Notes (Signed)
[facility] EMERGENCY DEPARTMENT Provider Note   CSN: [id number] Arrival date & time: 07/09/17  1244     History   Chief Complaint Chief Complaint  Patient presents with  . Altered Mental Status    HPI Bianca [name] is a 84 y.o. female.  HPI  The patient is an 84 year old female, she has a known history of chronic kidney disease, history of heart attack, history of hypertension and depression, there is no history of dementia or stroke.  She lives in a independent living facility where she eats breakfast with other residents in a group environment, it was noticed today that she did not comfort breakfast and when she did not come down for lunch it raise concern for the manager of the facility who went to check on her and found her to be slightly confused.  She was confused and that she said that she was waiting with her husband in the room, there had been a couple other comments where she seemed confused stating that she was in a place where she does not usually live.  That being said at this time her son states that she is her totally normal self without any confusion or behavior.  Patient denies any physical symptoms including fevers chills nausea vomiting diarrhea chest pain shortness of breath abdominal pain back pain numbness weakness blurred vision or sore throat.  She does have some swelling of her legs which is chronic and for which she takes multiple doses of diuretics a day and metolazone once a week.  The son postulates that she has had urinary infections in the past that correlated with this.  She denies any urinary symptoms  Past Medical History:  Diagnosis Date  . Acute blood loss anemia   . Arthritis   . CAD (coronary artery disease)/LBBB 01/01/2017  . Chronic systolic heart failure ([place])   . CKD (chronic kidney disease), stage III ([place]) 12/22/2015  . Depression 11/24/2015  . Hypertension   . Hypokalemia   . Myocardial infarction ([place]) 2008  .  Neuromuscular disorder ([place])   . Paroxysmal atrial fibrillation (HCC)   . Physical deconditioning   . Right flank hematoma   . Sleep apnea     Patient Active Problem List   Diagnosis Date Noted  . Acute on chronic systolic heart failure, NYHA class 1 ([place]) 01/01/2017  . Hypertension 01/01/2017  . Sleep apnea 01/01/2017  . CAD (coronary artery disease)/LBBB 01/01/2017  . History of ventricular tachycardia 01/01/2017  . PAF (paroxysmal atrial fibrillation) ([place]) 01/01/2017  . CKD (chronic kidney disease) stage 3, GFR 30-59 ml/min (HCC) 01/01/2017  . Peripheral neuropathy 01/01/2017  . OSA on CPAP 01/01/2017  . Obesity (BMI 30-39.9) 01/01/2017  . ICD (implantable cardioverter-defibrillator) in place 01/01/2017  . Acute combined systolic and diastolic congestive heart failure ([place]) 01/01/2017  . Acute on chronic systolic CHF (congestive heart failure) ([place])   . Dyspnea   . Ventricular tachycardia ([place])   . Subclinical hypothyroidism 12/25/2015  . Acute on chronic diastolic congestive heart failure ([place])   . CKD (chronic kidney disease), stage III ([place]) 12/22/2015  . Hypoxia 12/22/2015  . Hypoxemia 12/21/2015  . CHF exacerbation ([place]) 11/24/2015  . Physical deconditioning 11/24/2015  . Hyperglycemia 11/24/2015  . Peripheral neuropathy 11/24/2015  . HLD (hyperlipidemia) 11/24/2015  . Depression 11/24/2015  . Chest pain 11/24/2015  . Neuropathy involving both lower extremities   . Paroxysmal atrial fibrillation (HCC)   . OSA (obstructive sleep apnea)   .  E. coli UTI   . Subcutaneous hematoma   . Fall 10/25/2015  . Afib ([place]) 01/19/2015  . Long-term (current) use of anticoagulants 07/20/2014  . Persistent atrial fibrillation ([place]) 07/20/2014  . Encounter for therapeutic drug monitoring 07/16/2013  . Chronic systolic heart failure ([place]) 07/16/2013  . History of implantable cardioverter-defibrillator (ICD) placement 07/16/2013  . Morbid obesity ([place]) 07/16/2013  . Chronic  anticoagulation 07/16/2013  . Knee osteoarthritis 07/16/2013  . HYPOKALEMIA 08/09/2010  . Neuropathy ([place]) 08/09/2010  . Chronic diastolic (congestive) heart failure ([place]) 08/09/2010  . Automatic implantable cardioverter-defibrillator in situ 08/09/2010    Past Surgical History:  Procedure Laterality Date  . ABDOMINAL HYSTERECTOMY    . ANKLE SURGERY    . CARDIAC CATHETERIZATION    . CHOLECYSTECTOMY    . EP IMPLANTABLE DEVICE N/A 02/29/2016   Procedure: BIV ICD Generator Changeout;  Surgeon: Evans Lance, MD;  Location: [facility];  Service: Cardiovascular;  Laterality: N/A;  . EYE SURGERY    . INSERT / REPLACE / REMOVE PACEMAKER  2008  . KNEE LIGAMENT RECONSTRUCTION  2012  . TONSILLECTOMY      OB History    No data available       Home Medications    Prior to Admission medications   Medication Sig Start Date End Date Taking? Authorizing Provider  acetaminophen (TYLENOL) 325 MG tablet Take 650 mg by mouth 2 (two) times daily as needed for mild pain or headache.    Yes [provider]  allopurinol (ZYLOPRIM) 100 MG tablet Take 100 mg by mouth See admin instructions. Takes along with a 300 mg tablet to equal 400 mg    Yes [provider]  allopurinol (ZYLOPRIM) 300 MG tablet Take 300 mg by mouth See admin instructions. Take with the 100mg  tablet to equal 400mg  daily    Yes [provider]  atorvastatin (LIPITOR) 10 MG tablet Take 1 tablet (10 mg total) by mouth daily at 6 PM. 07/04/16  Yes Clegg, Amy D, NP  bisoprolol (ZEBETA) 5 MG tablet Take 0.5 tablets (2.5 mg total) by mouth at bedtime. 07/04/16  Yes Clegg, Amy D, NP  Calcium Carbonate-Vit D-Min (CALCIUM/VITAMIN D/MINERALS) 600-200 MG-UNIT TABS Take 1 tablet by mouth daily.   Yes [provider]  Cholecalciferol (VITAMIN D3) 1000 UNITS CAPS Take 1 capsule by mouth daily.   Yes [provider]  colestipol (COLESTID) 1 G tablet Take 2 g by mouth at bedtime.    Yes [provider]  ELIQUIS 5 MG TABS tablet TAKE 1 TABLET TWICE DAILY 06/07/17  Yes Larey Dresser, MD  gabapentin (NEURONTIN) 800 MG tablet Take 800 mg by mouth 3 (three) times daily.   Yes [provider]  guaiFENesin (MUCINEX) 600 MG 12 hr tablet Take 1 tablet (600 mg total) by mouth 2 (two) times daily. 11/29/15  Yes Theodis Blaze, MD  isosorbide mononitrate (IMDUR) 30 MG 24 hr tablet TAKE 1 TABLET BY MOUTH EVERY DAY 01/28/17  Yes Skains, Thana Farr, MD  KLOR-CON M20 20 MEQ tablet TAKE 2 TABLETS (40 MEQ TOTAL) BY MOUTH 2 (TWO) TIMES DAILY. 06/28/17  Yes Isaiah Serge, NP  metolazone (ZAROXOLYN) 2.5 MG tablet Take 1 tablet (2.5 mg total) by mouth once a week. 1 tab every Monday 06/07/17 09/05/17 Yes Isaiah Serge, NP  nortriptyline (PAMELOR) 25 MG capsule Take 25 mg by mouth at bedtime.   Yes [provider]  OXYGEN Inhale 2 L/min into the lungs  at bedtime as needed (uses as bedtime everynight and then occasionally iun the daytime as needed for SOB).    Yes [provider]  senna-docusate (SENOKOT-S) 8.6-50 MG tablet Take 1 tablet by mouth daily as needed for mild constipation.   Yes [provider]  spironolactone (ALDACTONE) 25 MG tablet Take 0.5 tablets (12.5 mg total) by mouth daily. 01/08/17  Yes Baldwin Jamaica, PA-C  torsemide (DEMADEX) 20 MG tablet Take 4 tablets (80 mg total) every morning and 2 tablets (40 mg total) six hours after morning dosage daily. 02/11/17  Yes Jerline Pain, MD  cephALEXin (KEFLEX) 500 MG capsule Take 1 capsule (500 mg total) by mouth 2 (two) times daily for 7 days. 07/09/17 07/16/17  Noemi Chapel, MD  nitroGLYCERIN (NITROSTAT) 0.4 MG SL tablet Place 1 tablet (0.4 mg total) under the tongue every 5 (five) minutes as needed for chest pain. 11/24/12   Jettie Booze, MD    Family History Family History  Problem Relation Age of Onset  . Stroke Mother   . Congestive Heart Failure Mother   . Tuberculosis Father   . Diabetes  Brother   . Stroke Brother   . Cancer Brother   . Heart attack Son     Social History Social History   Tobacco Use  . Smoking status: Former Research scientist (life sciences)  . Smokeless tobacco: Former Network engineer Use Topics  . Alcohol use: No  . Drug use: No     Allergies   Quinapril hcl; Tape; Tessalon [benzonatate]; and Neosporin [neomycin-polymyxin-gramicidin]   Review of Systems Review of Systems  All other systems reviewed and are negative.    Physical Exam Updated Vital Signs BP 120/81 (BP Location: Right Arm)   Pulse 79   Temp (!) 97.4 F (36.3 C) (Oral)   Resp 16   LMP  (LMP Unknown)   SpO2 99%   Physical Exam  Constitutional: She appears well-developed and well-nourished. No distress.  HENT:  Head: Normocephalic and atraumatic.  Mouth/Throat: Oropharynx is clear and moist. No oropharyngeal exudate.  Eyes: Conjunctivae and EOM are normal. Pupils are equal, round, and reactive to light. Right eye exhibits no discharge. Left eye exhibits no discharge. No scleral icterus.  Neck: Normal range of motion. Neck supple. No JVD present. No thyromegaly present.  Cardiovascular: Normal rate, regular rhythm, normal heart sounds and intact distal pulses. Exam reveals no gallop and no friction rub.  No murmur heard. Pulmonary/Chest: Effort normal and breath sounds normal. No respiratory distress. She has no wheezes. She has no rales.  Abdominal: Soft. Bowel sounds are normal. She exhibits no distension and no mass. There is no tenderness.  Musculoskeletal: Normal range of motion. She exhibits no edema or tenderness.  Lymphadenopathy:    She has no cervical adenopathy.  Neurological: She is alert. Coordination normal.  The patient is able to follow all of my commands without any difficulty, she has normal finger nose finger, no pronator drift, normal strength in the bilateral upper extremities, baseline weakness in the bilateral lower extremities with no asymmetry, cranial nerves III  through XII appear to be intact and she is able to answer my orientation questions appropriately with regard to location, people, person, date of birth etc.  Skin: Skin is warm and dry. No rash noted. No erythema.  Psychiatric: She has a normal mood and affect. Her behavior is normal.  Nursing note and vitals reviewed.    ED Treatments / Results  Labs (all labs ordered are listed,  but only abnormal results are displayed) Labs Reviewed  COMPREHENSIVE METABOLIC PANEL - Abnormal; Notable for the following components:      Result Value   Potassium 3.3 (*)    Chloride 97 (*)    Glucose, Bld 130 (*)    BUN 23 (*)    Creatinine, Ser 1.08 (*)    Total Bilirubin 1.4 (*)    GFR calc non Af Amer 46 (*)    GFR calc Af Amer 54 (*)    All other components within normal limits  URINALYSIS, ROUTINE W REFLEX MICROSCOPIC - Abnormal; Notable for the following components:   Color, Urine STRAW (*)    Leukocytes, UA LARGE (*)    Bacteria, UA RARE (*)    Squamous Epithelial / LPF 0-5 (*)    Non Squamous Epithelial 0-5 (*)    All other components within normal limits  URINE CULTURE  CBC  DIFFERENTIAL    EKG  EKG Interpretation None       Radiology Dg Chest 2 View  Result Date: 07/09/2017 CLINICAL DATA:  Confusion EXAM: CHEST  2 VIEW COMPARISON:  01/02/2017 FINDINGS: Biventricular ICD/pacer from the left with leads in stable position. Cardiomegaly is stable. Stable vascular pedicle widening accentuated by rotation. Low lung volumes with interstitial crowding. There is no edema, consolidation, effusion, or pneumothorax. IMPRESSION: 1. No acute finding when compared to prior. 2. Cardiomegaly with biventricular pacer. Electronically Signed   By: Monte Fantasia M.D.   On: 07/09/2017 14:24   Ct Head Wo Contrast  Result Date: 07/09/2017 CLINICAL DATA:  Acute onset confusion for 3 days. History of hypertension, atrial fibrillation. EXAM: CT HEAD WITHOUT CONTRAST TECHNIQUE: Contiguous axial images  were obtained from the base of the skull through the vertex without intravenous contrast. COMPARISON:  CT HEAD Oct 25, 2015 FINDINGS: BRAIN: No intraparenchymal hemorrhage, mass effect nor midline shift. The ventricles and sulci are normal for age. Confluent supratentorial white matter hypodensities. Patchy focal hypodensities bilateral basal ganglia and thalami associated with chronic small vessel ischemic disease. Old LEFT basal ganglia lacunar infarct. Old RIGHT cerebellar small infarct. No acute large vascular territory infarcts. No abnormal extra-axial fluid collections. Basal cisterns are patent. VASCULAR: Mild calcific atherosclerosis of the carotid siphons and intradural vertebral artery's. SKULL: No skull fracture. No significant scalp soft tissue swelling. SINUSES/ORBITS: Trace paranasal sinus mucosal thickening and small RIGHT maxillary mucosal retention cyst. Mastoid air cells are well aerated.The included ocular globes and orbital contents are non-suspicious. Status post bilateral ocular lens implants. OTHER: None. IMPRESSION: 1. No acute intracranial process. 2. Stable examination including moderate to severe chronic small vessel ischemic disease and old lacunar infarcts. 1. No acute Electronically Signed   By: Elon Alas M.D.   On: 07/09/2017 14:53    Procedures Procedures (including critical care time)  Medications Ordered in ED Medications  cephALEXin (KEFLEX) capsule 500 mg (not administered)     Initial Impression / Assessment and Plan / ED Course  I have reviewed the triage vital signs and the nursing notes.  Pertinent labs & imaging results that were available during my care of the patient were reviewed by me and considered in my medical decision making (see chart for details).    CT scan negative, chest x-ray negative, thus far the labs are unremarkable except for some mild hypokalemia which is not surprising given the patient's chronic diuretic use.  Will obtain a  urinalysis as well, the patient likely will be stable for discharge as long as that is  normal, she seems to have a relatively benign exam and certainly her mental status is unremarkable at this time.  Labs reveal urinary tract infection with too numerous to count white blood cells, large leukocytes, bacteria present, patient has been informed of her results as have the family members, they are in agreement with the plan of starting cephalexin.  She does have a history of infectious colitis thus we have encouraged probiotics which she is already taking.  Stable for discharge, vital signs are unremarkable  Vitals:   07/09/17 1256  BP: 120/81  Pulse: 79  Resp: 16  Temp: (!) 97.4 F (36.3 C)  TempSrc: Oral  SpO2: 99%     Final Clinical Impressions(s) / ED Diagnoses   Final diagnoses:  Acute cystitis without hematuria    ED Discharge Orders        Ordered    cephALEXin (KEFLEX) 500 MG capsule  2 times daily     07/09/17 1608       Noemi Chapel, MD 07/09/17 1610

## 2017-07-10 LAB — CUP PACEART REMOTE DEVICE CHECK
Battery Remaining Longevity: 82 mo
Brady Statistic AP VP Percent: 2.36 %
Brady Statistic AP VS Percent: 0.02 %
Brady Statistic AS VP Percent: 95.38 %
Brady Statistic AS VS Percent: 2.25 %
Brady Statistic RV Percent Paced: 92.34 %
Date Time Interrogation Session: 20190121051603
HighPow Impedance: 42 Ohm
HighPow Impedance: 55 Ohm
Implantable Lead Implant Date: 20080821
Implantable Lead Location: 753859
Implantable Lead Location: 753860
Implantable Lead Model: 5076
Implantable Lead Model: 6947
Lead Channel Impedance Value: 399 Ohm
Lead Channel Impedance Value: 399 Ohm
Lead Channel Impedance Value: 513 Ohm
Lead Channel Impedance Value: 532 Ohm
Lead Channel Impedance Value: 817 Ohm
Lead Channel Pacing Threshold Amplitude: 0.625 V
Lead Channel Pacing Threshold Amplitude: 0.75 V
Lead Channel Sensing Intrinsic Amplitude: 1.25 mV
Lead Channel Sensing Intrinsic Amplitude: 1.25 mV
Lead Channel Setting Pacing Amplitude: 2.5 V
Lead Channel Setting Pacing Pulse Width: 0.4 ms
Lead Channel Setting Sensing Sensitivity: 0.3 mV
MDC IDC LEAD IMPLANT DT: 20080821
MDC IDC LEAD IMPLANT DT: 20080821
MDC IDC LEAD LOCATION: 753858
MDC IDC MSMT BATTERY VOLTAGE: 2.98 V
MDC IDC MSMT LEADCHNL LV PACING THRESHOLD PULSEWIDTH: 0.4 ms
MDC IDC MSMT LEADCHNL RA IMPEDANCE VALUE: 437 Ohm
MDC IDC MSMT LEADCHNL RA PACING THRESHOLD PULSEWIDTH: 0.4 ms
MDC IDC MSMT LEADCHNL RV PACING THRESHOLD AMPLITUDE: 0.75 V
MDC IDC MSMT LEADCHNL RV PACING THRESHOLD PULSEWIDTH: 0.4 ms
MDC IDC MSMT LEADCHNL RV SENSING INTR AMPL: 13.75 mV
MDC IDC MSMT LEADCHNL RV SENSING INTR AMPL: 13.75 mV
MDC IDC PG IMPLANT DT: 20170913
MDC IDC SET LEADCHNL LV PACING AMPLITUDE: 1.25 V
MDC IDC SET LEADCHNL LV PACING PULSEWIDTH: 0.4 ms
MDC IDC SET LEADCHNL RA PACING AMPLITUDE: 2 V
MDC IDC STAT BRADY RA PERCENT PACED: 2.32 %

## 2017-07-11 LAB — URINE CULTURE: Culture: >=100000 — AB

## 2017-07-12 ENCOUNTER — Telehealth: Payer: Self-pay | Admitting: Emergency Medicine

## 2017-07-12 NOTE — Telephone Encounter (Signed)
Post ED Visit - Positive Culture Follow-up  Culture report reviewed by antimicrobial stewardship pharmacist:  [x]  Elenor Quinones, Pharm.D. []  Heide Guile, Pharm.D., BCPS AQ-ID []  Parks Neptune, Pharm.D., BCPS []  Alycia Rossetti, Pharm.D., BCPS []  [place], Pharm.D., BCPS, AAHIVP []  Legrand Como, Pharm.D., BCPS, AAHIVP []  Salome Arnt, PharmD, BCPS []  Jalene Mullet, PharmD []  Vincenza Hews, PharmD, BCPS  Positive urine culture Treated with cephalexin, organism sensitive to the same and no further patient follow-up is required at this time.  Hazle Nordmann 07/12/2017, 10:49 AM

## 2017-07-18 ENCOUNTER — Encounter (INDEPENDENT_AMBULATORY_CARE_PROVIDER_SITE_OTHER): Payer: Self-pay

## 2017-07-18 ENCOUNTER — Encounter: Payer: Self-pay | Admitting: Cardiology

## 2017-07-18 ENCOUNTER — Ambulatory Visit: Payer: PPO | Admitting: Cardiology

## 2017-07-18 VITALS — BP 100/64 | HR 88 | Wt 210.0 lb

## 2017-07-18 DIAGNOSIS — I5022 Chronic systolic (congestive) heart failure: Secondary | ICD-10-CM | POA: Diagnosis not present

## 2017-07-18 DIAGNOSIS — Z95810 Presence of automatic (implantable) cardiac defibrillator: Secondary | ICD-10-CM | POA: Diagnosis not present

## 2017-07-18 MED ORDER — METOPROLOL SUCCINATE ER 25 MG PO TB24: 25.0000 mg | ORAL_TABLET | Freq: Every day | ORAL | 3 refills | Status: DC

## 2017-07-18 NOTE — Progress Notes (Signed)
[facility]. [address], [address], [place]  [address] Phone: [phone] Fax:  [phone]  Date:  07/18/2017   ID:  Diamon, Reddinger 07/25/1932, MRN [id number]  PCP:  Maurice Small, MD   History of Present Illness: Bianca [name] is a 84 y.o. female with nonischemic cardiomyopathy, hx of atrial fibrillation-currently with chronic anticoagulation, hx of Chronic kidney disease-stage III, chronic systolic heart failure-status post Bi- ventricular ICD-followed by Dr. Lovena Le. No defibrillations. EF 35%. Symptoms have been very stable. NYHA class II except for hospitalization for heart failure she was discharged on 11/24/12 with primary diagnosis of acute on chronic systolic heart failure. She was admitted with fluid overload, shortness of breath and diuresed successfully.  She was also admitted to the hospital in July 2018 with CHF exacerbation EF was once again down again and plan was to follow-up with outpatient stress testing.  She did undergo a nuclear stress test on 01/10/17:  The left ventricular ejection fraction is severely decreased (<30%).  Nuclear stress EF: 20%.  There was no ST segment deviation noted during stress.  Defect 1: There is a large defect of moderate severity present in the basal inferior, basal inferolateral, mid inferior, mid inferolateral and apical inferior location.  This is a high risk study.  Findings consistent with prior myocardial infarction.   High risk study due to the presence of a large inferolateral scar and severely depressed overall left ventricular systolic function. There is no meaningful reversible ischemia identified.  I personally reviewed her previous angiogram and she had wide-open coronary arteries in 2008. She has had no chest pain episodes suggestive of old inferior infarct. Perhaps some of this was attenuation artifact?.  Since being in the hospital in July 2018 she is feeling better. That episode came on all of a sudden.  She just seen Dr. Justin Mend. Her daughter states that her ICD has been the only reliable way to detect fluid overload. Currently she denies any significant shortness of breath. She has frequent urination.  Remember angioedema with ACE inhibitor.  This CHF episodes seem to come on quite suddenly.  She did have periods of hypercarbia with CO2 above 50 with hypersomnolence and has had untreated sleep apnea. Still with mild SOB when talking but not severe. Doing well with CPAP.   Husband had dementia, died. Her family is very helpful with her overall care.  Unfortunately her son has lung cancer that has metastasized. He is 82 years old.  Prior ICD transmission shows decreased impedance which may be suggestive of increased fluid.  Lives [employer]. BP low 90's at times. Dr. Justin Mend - BP low that day. Only had one day of dizziness. Has not had any dizziness since. When Dr. Justin Mend saw her and she had lower blood pressure, there was consideration of decreasing her torsemide. I'm worried about doing this given her tenuous status of the last few months going in and out of the hospital 3 separate occasions. She currently has a blood pressure of 110/68, pulse of 90. We will continue current regimen.  07/18/17  -In ER on 07/09/17 with urinary tract infection.  Treated with appropriate antibiotic according to urine culture sensitivities, reviewed with her and daughter.  Overall no shortness of breath, no chest pain, no orthopnea, no PND.  She had been taking Tylenol PM for the last few days and I advised against this given the diphenhydramine, Benadryl as this may cause confusion in older population as well as orthostasis.  Transmissions of ICD have been stable.   Wt Readings from Last 3 Encounters:  07/18/17 210 lb (95.3 kg)  06/07/17 217 lb (98.4 kg)  03/28/17 219 lb (99.3 kg)     Past Medical History:  Diagnosis Date  . Acute blood loss anemia   . Arthritis   . CAD (coronary artery disease)/LBBB  01/01/2017  . Chronic systolic heart failure ([place])   . CKD (chronic kidney disease), stage III ([place]) 12/22/2015  . Depression 11/24/2015  . Hypertension   . Hypokalemia   . Myocardial infarction ([facility]) 2008  . Neuromuscular disorder ([facility])   . Paroxysmal atrial fibrillation (HCC)   . Physical deconditioning   . Right flank hematoma   . Sleep apnea     Past Surgical History:  Procedure Laterality Date  . ABDOMINAL HYSTERECTOMY    . ANKLE SURGERY    . CARDIAC CATHETERIZATION    . CHOLECYSTECTOMY    . EP IMPLANTABLE DEVICE N/A 02/29/2016   Procedure: BIV ICD Generator Changeout;  Surgeon: Evans Lance, MD;  Location: [facility];  Service: Cardiovascular;  Laterality: N/A;  . EYE SURGERY    . INSERT / REPLACE / REMOVE PACEMAKER  2008  . KNEE LIGAMENT RECONSTRUCTION  2012  . TONSILLECTOMY      Current Outpatient Medications  Medication Sig Dispense Refill  . acetaminophen (TYLENOL) 325 MG tablet Take 650 mg by mouth 2 (two) times daily as needed for mild pain or headache.     . allopurinol (ZYLOPRIM) 100 MG tablet Take 100 mg by mouth See admin instructions. Takes along with a 300 mg tablet to equal 400 mg     . allopurinol (ZYLOPRIM) 300 MG tablet Take 300 mg by mouth See admin instructions. Take with the 100mg  tablet to equal 400mg  daily     . atorvastatin (LIPITOR) 10 MG tablet TAKE 1 TABLET (10 MG TOTAL) BY MOUTH DAILY AT 6 PM. 90 tablet 3  . Calcium Carbonate-Vit D-Min (CALCIUM/VITAMIN D/MINERALS) 600-200 MG-UNIT TABS Take 1 tablet by mouth daily.    . Cholecalciferol (VITAMIN D3) 1000 UNITS CAPS Take 1 capsule by mouth daily.    . colestipol (COLESTID) 1 G tablet Take 2 g by mouth at bedtime.     . diphenhydramine-acetaminophen (TYLENOL PM) 25-500 MG TABS tablet Take 1 tablet by mouth at bedtime as needed (SLEEP).    Marland Kitchen ELIQUIS 5 MG TABS tablet TAKE 1 TABLET TWICE DAILY 180 tablet 1  . gabapentin (NEURONTIN) 800 MG tablet Take 800 mg by mouth 3 (three) times daily.    Marland Kitchen  guaiFENesin (MUCINEX) 600 MG 12 hr tablet Take 1 tablet (600 mg total) by mouth 2 (two) times daily.    . isosorbide mononitrate (IMDUR) 30 MG 24 hr tablet TAKE 1 TABLET BY MOUTH EVERY DAY 30 tablet 11  . KLOR-CON M20 20 MEQ tablet TAKE 2 TABLETS (40 MEQ TOTAL) BY MOUTH 2 (TWO) TIMES DAILY. 360 tablet 3  . metolazone (ZAROXOLYN) 2.5 MG tablet Take 1 tablet (2.5 mg total) by mouth once a week. 1 tab every Monday 24 tablet 3  . nitroGLYCERIN (NITROSTAT) 0.4 MG SL tablet Place 1 tablet (0.4 mg total) under the tongue every 5 (five) minutes as needed for chest pain. 25 tablet 6  . nortriptyline (PAMELOR) 25 MG capsule Take 25 mg by mouth at bedtime.    . OXYGEN Inhale 2 L/min into the lungs at bedtime as needed (uses as bedtime everynight and then occasionally iun the daytime as needed  for SOB).     Marland Kitchen senna-docusate (SENOKOT-S) 8.6-50 MG tablet Take 1 tablet by mouth daily as needed for mild constipation.    Marland Kitchen spironolactone (ALDACTONE) 25 MG tablet Take 0.5 tablets (12.5 mg total) by mouth daily. 30 tablet 11  . torsemide (DEMADEX) 20 MG tablet Take 4 tablets (80 mg total) every morning and 2 tablets (40 mg total) six hours after morning dosage daily. 540 tablet 3  . metoprolol succinate (TOPROL-XL) 25 MG 24 hr tablet Take 1 tablet (25 mg total) by mouth daily. 90 tablet 3   No current facility-administered medications for this visit.     Allergies:    Allergies  Allergen Reactions  . Quinapril Hcl Swelling    Tongue and throat  . Tape Other (See Comments)    Plastic tape causes irritation.   Lavella Lemons [Benzonatate] Swelling and Other (See Comments)    Capsule opened in mouth, not an allergy  . Neosporin [Neomycin-Polymyxin-Gramicidin] Rash    Social History:  The patient  reports that she has quit smoking. She has quit using smokeless tobacco. She reports that she does not drink alcohol or use drugs.   ROS:  Please see the history of present illness. Has issues with neuropathy, fatigue,  occasional shortness of breath. No chest pain, no bleeding.  PHYSICAL EXAM: VS:  BP 100/64   Pulse 88   Wt 210 lb (95.3 kg)   LMP  (LMP Unknown)   SpO2 97%   BMI 33.89 kg/m  GEN: Well nourished, well developed, in no acute distress , obese, walker HEENT: normal  Neck: no JVD, carotid bruits, or masses Cardiac: RRR; no murmurs, rubs, or gallops,no edema  Respiratory:  clear to auscultation bilaterally, normal work of breathing GI: soft, nontender, nondistended, + BS MS: no deformity or atrophy  Skin: warm and dry, no rash Neuro:  Alert and Oriented x 3, Strength and sensation are intact Psych: euthymic mood, full affect    EKG:  Prior 12/28/14-appears sinus rhythm low voltage P waves seems to be proceeding curette complex with ventricular pacing..    Labs: 07/20/14-triglycerides 332, LDL 80, HDL 39   Cardiac catheterization 2008: No significant coronary artery disease-personally viewed  ASSESSMENT AND PLAN:  1. Atrial fibrillation-currently controlled, chronic anticoagulation in place. Eliquis CHADS-VASC - 5. Defibrillator/Pacemaker in place.  Dr. Tanna Furry note reviewed. No current symptoms.  Stable 2. Chronic anticoagulation- Eliquis continue to monitor CBC and basic metabolic profile, stable 3. Nonischemic Cardiomyopathy/chronic systolic heart failure-EF 20-35%. Medications reviewed. NYHA 2. Last hospitalization 12/2016 and before that- 11/2012. Watch her diet, sodium. No changes made in her diuretic regimen at this point. Stressed the importance of 1 L fluid restriction. No salt. She states that she is often thirsty. I explained the physiology behind this. Unable to take ACE inhibitor because of angioedema. We will continue with isosorbide.  Bisoprolol on back order.  We are changing her to low-dose Toprol 25 mg once a day.  She was on bisoprolol 2.5 mg at bedtime. 4. History of ICD implantation, new device placed 02/29/16. - functioning well. Having monthly transmissions. This helps  [place] to determine fluid status as well.  Weekly metolazone seems to help. 5. Hyperlipidemia/hypertriglyceridemia- had been on Lopid as well as pravastatin. Baseline triglycerides are greater than 500 which puts her at risk for pancreatitis. She is now taking atorvastatin 10 mg once a day. We will continue. Education reviewed.  Prior LDL 80 6. Right knee osteoarthrosis - uses walker behind right knee pain is  musculoskeletal.  Remember, she is on Eliquis which will prevent blood clots.  She was worried about this. 7. Morbid obesity-continue to encourage weight loss. BMI greater than 35 with tumor comorbidities, continue to advocate weight loss. 8. 2 month follow up with APP  Signed, Candee Furbish, MD [facility]  07/18/2017 8:54 AM

## 2017-07-18 NOTE — Patient Instructions (Addendum)
Medication Instructions:  Please discontinue Bisoprolol and start Metoprolol succinate 25 mg at bedtime. Continue all other medications as listed.  Labwork: None  Testing/Procedures: None  Follow-Up: Follow up in 3 months with [name], NP.  You will receive a letter in the mail 2 months before you are due.  Please call us when you receive this letter to schedule your follow up appointment.   If you need a refill on your cardiac medications before your next appointment, please call your pharmacy.  Thank you for choosing [facility]!!

## 2017-07-23 DIAGNOSIS — R410 Disorientation, unspecified: Secondary | ICD-10-CM | POA: Diagnosis not present

## 2017-07-23 DIAGNOSIS — I10 Essential (primary) hypertension: Secondary | ICD-10-CM | POA: Diagnosis not present

## 2017-07-23 DIAGNOSIS — I5022 Chronic systolic (congestive) heart failure: Secondary | ICD-10-CM | POA: Diagnosis not present

## 2017-07-23 DIAGNOSIS — G6289 Other specified polyneuropathies: Secondary | ICD-10-CM | POA: Diagnosis not present

## 2017-07-28 ENCOUNTER — Emergency Department (HOSPITAL_COMMUNITY): Payer: PPO

## 2017-07-28 ENCOUNTER — Encounter (HOSPITAL_COMMUNITY): Payer: Self-pay

## 2017-07-28 ENCOUNTER — Other Ambulatory Visit: Payer: Self-pay

## 2017-07-28 ENCOUNTER — Emergency Department (HOSPITAL_COMMUNITY)
Admission: EM | Admit: 2017-07-28 | Discharge: 2017-07-28 | Disposition: A | Discharge status: Home / Self Care | Payer: PPO | Source: Home / Self Care | Attending: Emergency Medicine | Admitting: Emergency Medicine

## 2017-07-28 DIAGNOSIS — I5022 Chronic systolic (congestive) heart failure: Secondary | ICD-10-CM | POA: Insufficient documentation

## 2017-07-28 DIAGNOSIS — E785 Hyperlipidemia, unspecified: Secondary | ICD-10-CM | POA: Diagnosis not present

## 2017-07-28 DIAGNOSIS — G4733 Obstructive sleep apnea (adult) (pediatric): Secondary | ICD-10-CM | POA: Diagnosis not present

## 2017-07-28 DIAGNOSIS — R17 Unspecified jaundice: Secondary | ICD-10-CM | POA: Diagnosis not present

## 2017-07-28 DIAGNOSIS — M1711 Unilateral primary osteoarthritis, right knee: Secondary | ICD-10-CM | POA: Diagnosis not present

## 2017-07-28 DIAGNOSIS — I5043 Acute on chronic combined systolic (congestive) and diastolic (congestive) heart failure: Secondary | ICD-10-CM | POA: Diagnosis not present

## 2017-07-28 DIAGNOSIS — Z79899 Other long term (current) drug therapy: Secondary | ICD-10-CM

## 2017-07-28 DIAGNOSIS — E876 Hypokalemia: Secondary | ICD-10-CM | POA: Diagnosis not present

## 2017-07-28 DIAGNOSIS — Z87891 Personal history of nicotine dependence: Secondary | ICD-10-CM | POA: Insufficient documentation

## 2017-07-28 DIAGNOSIS — R4182 Altered mental status, unspecified: Secondary | ICD-10-CM | POA: Diagnosis not present

## 2017-07-28 DIAGNOSIS — R404 Transient alteration of awareness: Secondary | ICD-10-CM | POA: Diagnosis not present

## 2017-07-28 DIAGNOSIS — J9601 Acute respiratory failure with hypoxia: Secondary | ICD-10-CM | POA: Diagnosis not present

## 2017-07-28 DIAGNOSIS — Z888 Allergy status to other drugs, medicaments and biological substances status: Secondary | ICD-10-CM | POA: Diagnosis not present

## 2017-07-28 DIAGNOSIS — N390 Urinary tract infection, site not specified: Secondary | ICD-10-CM | POA: Insufficient documentation

## 2017-07-28 DIAGNOSIS — Z95810 Presence of automatic (implantable) cardiac defibrillator: Secondary | ICD-10-CM | POA: Diagnosis not present

## 2017-07-28 DIAGNOSIS — I509 Heart failure, unspecified: Secondary | ICD-10-CM | POA: Diagnosis not present

## 2017-07-28 DIAGNOSIS — R5383 Other fatigue: Secondary | ICD-10-CM | POA: Diagnosis not present

## 2017-07-28 DIAGNOSIS — I4891 Unspecified atrial fibrillation: Secondary | ICD-10-CM | POA: Diagnosis not present

## 2017-07-28 DIAGNOSIS — N183 Chronic kidney disease, stage 3 (moderate): Secondary | ICD-10-CM | POA: Insufficient documentation

## 2017-07-28 DIAGNOSIS — I2510 Atherosclerotic heart disease of native coronary artery without angina pectoris: Secondary | ICD-10-CM | POA: Insufficient documentation

## 2017-07-28 DIAGNOSIS — R031 Nonspecific low blood-pressure reading: Secondary | ICD-10-CM | POA: Diagnosis not present

## 2017-07-28 DIAGNOSIS — I252 Old myocardial infarction: Secondary | ICD-10-CM

## 2017-07-28 DIAGNOSIS — Z87440 Personal history of urinary (tract) infections: Secondary | ICD-10-CM | POA: Diagnosis not present

## 2017-07-28 DIAGNOSIS — Z7901 Long term (current) use of anticoagulants: Secondary | ICD-10-CM | POA: Insufficient documentation

## 2017-07-28 DIAGNOSIS — R531 Weakness: Secondary | ICD-10-CM

## 2017-07-28 DIAGNOSIS — R32 Unspecified urinary incontinence: Secondary | ICD-10-CM | POA: Diagnosis not present

## 2017-07-28 DIAGNOSIS — R05 Cough: Secondary | ICD-10-CM | POA: Diagnosis not present

## 2017-07-28 DIAGNOSIS — E669 Obesity, unspecified: Secondary | ICD-10-CM | POA: Diagnosis not present

## 2017-07-28 DIAGNOSIS — Z90710 Acquired absence of both cervix and uterus: Secondary | ICD-10-CM | POA: Diagnosis not present

## 2017-07-28 DIAGNOSIS — R0602 Shortness of breath: Secondary | ICD-10-CM | POA: Diagnosis not present

## 2017-07-28 DIAGNOSIS — G629 Polyneuropathy, unspecified: Secondary | ICD-10-CM | POA: Diagnosis not present

## 2017-07-28 DIAGNOSIS — I5023 Acute on chronic systolic (congestive) heart failure: Secondary | ICD-10-CM | POA: Diagnosis not present

## 2017-07-28 DIAGNOSIS — I130 Hypertensive heart and chronic kidney disease with heart failure and stage 1 through stage 4 chronic kidney disease, or unspecified chronic kidney disease: Secondary | ICD-10-CM

## 2017-07-28 DIAGNOSIS — Z9049 Acquired absence of other specified parts of digestive tract: Secondary | ICD-10-CM | POA: Diagnosis not present

## 2017-07-28 DIAGNOSIS — Z6832 Body mass index (BMI) 32.0-32.9, adult: Secondary | ICD-10-CM | POA: Diagnosis not present

## 2017-07-28 DIAGNOSIS — L304 Erythema intertrigo: Secondary | ICD-10-CM | POA: Diagnosis not present

## 2017-07-28 DIAGNOSIS — Z713 Dietary counseling and surveillance: Secondary | ICD-10-CM | POA: Diagnosis not present

## 2017-07-28 DIAGNOSIS — R410 Disorientation, unspecified: Secondary | ICD-10-CM | POA: Diagnosis not present

## 2017-07-28 DIAGNOSIS — I480 Paroxysmal atrial fibrillation: Secondary | ICD-10-CM | POA: Diagnosis not present

## 2017-07-28 DIAGNOSIS — B372 Candidiasis of skin and nail: Secondary | ICD-10-CM | POA: Diagnosis not present

## 2017-07-28 DIAGNOSIS — I429 Cardiomyopathy, unspecified: Secondary | ICD-10-CM | POA: Diagnosis not present

## 2017-07-28 LAB — URINALYSIS, ROUTINE W REFLEX MICROSCOPIC
Bilirubin Urine: NEGATIVE
Glucose, UA: NEGATIVE mg/dL
Hgb urine dipstick: NEGATIVE
KETONES UR: NEGATIVE mg/dL
NITRITE: NEGATIVE
PROTEIN: NEGATIVE mg/dL
Specific Gravity, Urine: 1.009 (ref 1.005–1.030)
pH: 6 (ref 5.0–8.0)

## 2017-07-28 LAB — BLOOD GAS, ARTERIAL
Acid-Base Excess: 0.8 mmol/L (ref 0.0–2.0)
Bicarbonate: 25 mmol/L (ref 20.0–28.0)
Drawn by: 270211
FIO2: 0.21
O2 SAT: 85.9 %
PATIENT TEMPERATURE: 98.6
PO2 ART: 53.4 mmHg — AB (ref 83.0–108.0)
pCO2 arterial: 40.1 mmHg (ref 32.0–48.0)
pH, Arterial: 7.411 (ref 7.350–7.450)

## 2017-07-28 LAB — DIFFERENTIAL
Basophils Absolute: 0 10*3/uL (ref 0.0–0.1)
Basophils Relative: 0 %
EOS ABS: 0.2 10*3/uL (ref 0.0–0.7)
EOS PCT: 2 %
LYMPHS PCT: 26 %
Lymphs Abs: 2.6 10*3/uL (ref 0.7–4.0)
MONOS PCT: 9 %
Monocytes Absolute: 0.9 10*3/uL (ref 0.1–1.0)
Neutro Abs: 6.3 10*3/uL (ref 1.7–7.7)
Neutrophils Relative %: 63 %

## 2017-07-28 LAB — PROTIME-INR
INR: 1.72
Prothrombin Time: 20 seconds — ABNORMAL HIGH (ref 11.4–15.2)

## 2017-07-28 LAB — COMPREHENSIVE METABOLIC PANEL
ALBUMIN: 4.1 g/dL (ref 3.5–5.0)
ALK PHOS: 65 U/L (ref 38–126)
ALT: 14 U/L (ref 14–54)
ANION GAP: 9 (ref 5–15)
AST: 21 U/L (ref 15–41)
BILIRUBIN TOTAL: 1.2 mg/dL (ref 0.3–1.2)
BUN: 25 mg/dL — AB (ref 6–20)
CALCIUM: 9 mg/dL (ref 8.9–10.3)
CO2: 25 mmol/L (ref 22–32)
Chloride: 101 mmol/L (ref 101–111)
Creatinine, Ser: 1.1 mg/dL — ABNORMAL HIGH (ref 0.44–1.00)
GFR calc Af Amer: 53 mL/min — ABNORMAL LOW (ref >60)
GFR, EST NON AFRICAN AMERICAN: 45 mL/min — AB (ref >60)
GLUCOSE: 117 mg/dL — AB (ref 65–99)
POTASSIUM: 4 mmol/L (ref 3.5–5.1)
Sodium: 135 mmol/L (ref 135–145)
TOTAL PROTEIN: 7.2 g/dL (ref 6.5–8.1)

## 2017-07-28 LAB — I-STAT TROPONIN, ED: TROPONIN I, POC: 0.02 ng/mL (ref 0.00–0.08)

## 2017-07-28 LAB — CBC
HEMATOCRIT: 36.7 % (ref 36.0–46.0)
Hemoglobin: 12.2 g/dL (ref 12.0–15.0)
MCH: 31.9 pg (ref 26.0–34.0)
MCHC: 33.2 g/dL (ref 30.0–36.0)
MCV: 96.1 fL (ref 78.0–100.0)
Platelets: 196 10*3/uL (ref 150–400)
RBC: 3.82 MIL/uL — AB (ref 3.87–5.11)
RDW: 14.4 % (ref 11.5–15.5)
WBC: 10 10*3/uL (ref 4.0–10.5)

## 2017-07-28 LAB — APTT: aPTT: 43 seconds — ABNORMAL HIGH (ref 24–36)

## 2017-07-28 LAB — AMMONIA: AMMONIA: 16 umol/L (ref 9–35)

## 2017-07-28 MED ORDER — SODIUM CHLORIDE 0.9 % IV BOLUS (SEPSIS)
500.0000 mL | Freq: Once | INTRAVENOUS | Status: AC
  Administered 2017-07-28: 500 mL via INTRAVENOUS

## 2017-07-28 MED ORDER — CEPHALEXIN 500 MG PO CAPS: 500.0000 mg | ORAL_CAPSULE | Freq: Three times a day (TID) | ORAL | 0 refills | Status: DC

## 2017-07-28 MED ORDER — CEFTRIAXONE SODIUM 1 G IJ SOLR
1.0000 g | Freq: Once | INTRAMUSCULAR | Status: AC
  Administered 2017-07-28: 1 g via INTRAVENOUS
  Filled 2017-07-28: qty 10

## 2017-07-28 NOTE — ED Provider Notes (Signed)
[facility] DEPT Provider Note   CSN: [id number] Arrival date & time: 07/28/17  1009     History   Chief Complaint Chief Complaint  Patient presents with  . Fatigue    HPI ZOA DOWTY is a 84 y.o. female.  HPI Patient is a 77-year-old female who states she is felt more tired over the past several days.  She feels like she is been sleeping more.  No significant altered mental status.  No documented fever.  Patient reports poor appetite over the past several days.  Cough without shortness of breath.  No chest pain.  No urinary symptoms.  Denies nausea vomiting diarrhea.  Denies pain anywhere.  No myalgias.  No significant headache.  No new rash   Past Medical History:  Diagnosis Date  . Acute blood loss anemia   . Arthritis   . CAD (coronary artery disease)/LBBB 01/01/2017  . Chronic systolic heart failure ([place])   . CKD (chronic kidney disease), stage III ([place]) 12/22/2015  . Depression 11/24/2015  . Hypertension   . Hypokalemia   . Myocardial infarction ([facility]) 2008  . Neuromuscular disorder ([place])   . Paroxysmal atrial fibrillation (H[facility])   . Physical deconditioning   . Right flank hematoma   . Sleep apnea     Patient Active Problem List   Diagnosis Date Noted  . Acute on chronic systolic heart failure, NYHA class 1 ([place]) 01/01/2017  . Hypertension 01/01/2017  . Sleep apnea 01/01/2017  . CAD (coronary artery disease)/LBBB 01/01/2017  . History of ventricular tachycardia 01/01/2017  . PAF (paroxysmal atrial fibrillation) ([place]) 01/01/2017  . CKD (chronic kidney disease) stage 3, GFR 30-59 ml/min (HCC) 01/01/2017  . Peripheral neuropathy 01/01/2017  . OSA on CPAP 01/01/2017  . Obesity (BMI 30-39.9) 01/01/2017  . ICD (implantable cardioverter-defibrillator) in place 01/01/2017  . Acute combined systolic and diastolic congestive heart failure ([place]) 01/01/2017  . Acute on chronic systolic CHF (congestive heart failure) ([place])   . Dyspnea   .  Ventricular tachycardia ([place])   . Subclinical hypothyroidism 12/25/2015  . Acute on chronic diastolic congestive heart failure ([facility])   . CKD (chronic kidney disease), stage III ([place]) 12/22/2015  . Hypoxia 12/22/2015  . Hypoxemia 12/21/2015  . CHF exacerbation ([place]) 11/24/2015  . Physical deconditioning 11/24/2015  . Hyperglycemia 11/24/2015  . Peripheral neuropathy 11/24/2015  . HLD (hyperlipidemia) 11/24/2015  . Depression 11/24/2015  . Chest pain 11/24/2015  . Neuropathy involving both lower extremities   . Paroxysmal atrial fibrillation (HCC)   . OSA (obstructive sleep apnea)   . E. coli UTI   . Subcutaneous hematoma   . Fall 10/25/2015  . Afib ([place]) 01/19/2015  . Long-term (current) use of anticoagulants 07/20/2014  . Persistent atrial fibrillation ([place]) 07/20/2014  . Encounter for therapeutic drug monitoring 07/16/2013  . Chronic systolic heart failure ([place]) 07/16/2013  . History of implantable cardioverter-defibrillator (ICD) placement 07/16/2013  . Morbid obesity ([place]) 07/16/2013  . Chronic anticoagulation 07/16/2013  . Knee osteoarthritis 07/16/2013  . HYPOKALEMIA 08/09/2010  . Neuropathy ([place]) 08/09/2010  . Chronic diastolic (congestive) heart failure ([place]) 08/09/2010  . Automatic implantable cardioverter-defibrillator in situ 08/09/2010    Past Surgical History:  Procedure Laterality Date  . ABDOMINAL HYSTERECTOMY    . ANKLE SURGERY    . CARDIAC CATHETERIZATION    . CHOLECYSTECTOMY    . EP IMPLANTABLE DEVICE N/A 02/29/2016   Procedure: BIV ICD Generator Changeout;  Surgeon: Evans Lance, MD;  Location: [facility];  Service: Cardiovascular;  Laterality: N/A;  . EYE SURGERY    . INSERT / REPLACE / REMOVE PACEMAKER  2008  . KNEE LIGAMENT RECONSTRUCTION  2012  . TONSILLECTOMY      OB History    No data available       Home Medications    Prior to Admission medications   Medication Sig Start Date End Date Taking? Authorizing Provider    acetaminophen (TYLENOL) 325 MG tablet Take 325-650 mg by mouth 2 (two) times daily as needed for mild pain or headache.    Yes [provider]  acidophilus (RISAQUAD) CAPS capsule Take 1 capsule by mouth daily.   Yes [provider]  allopurinol (ZYLOPRIM) 100 MG tablet Take 100 mg by mouth See admin instructions. Takes along with a 300 mg tablet to equal 400 mg    Yes [provider]  allopurinol (ZYLOPRIM) 300 MG tablet Take 300 mg by mouth See admin instructions. Take with the 100mg  tablet to equal 400mg  daily    Yes [provider]  atorvastatin (LIPITOR) 10 MG tablet TAKE 1 TABLET (10 MG TOTAL) BY MOUTH DAILY AT 6 PM. 07/10/17  Yes Jerline Pain, MD  Calcium Carbonate-Vit D-Min (CALCIUM/VITAMIN D/MINERALS) 600-200 MG-UNIT TABS Take 1 tablet by mouth 2 (two) times daily.    Yes [provider]  Cholecalciferol (VITAMIN D3) 1000 UNITS CAPS Take 1 capsule by mouth daily.   Yes [provider]  colestipol (COLESTID) 1 G tablet Take 2 g by mouth at bedtime.    Yes [provider]  diphenhydramine-acetaminophen (TYLENOL PM) 25-500 MG TABS tablet Take 1 tablet by mouth at bedtime as needed (SLEEP).   Yes [provider]  ELIQUIS 5 MG TABS tablet TAKE 1 TABLET TWICE DAILY 06/07/17  Yes Larey Dresser, MD  gabapentin (NEURONTIN) 800 MG tablet Take 800 mg by mouth 3 (three) times daily.   Yes [provider]  guaiFENesin (MUCINEX) 600 MG 12 hr tablet Take 1 tablet (600 mg total) by mouth 2 (two) times daily. 11/29/15  Yes Theodis Blaze, MD  isosorbide mononitrate (IMDUR) 30 MG 24 hr tablet TAKE 1 TABLET BY MOUTH EVERY DAY 01/28/17  Yes Skains, Thana Farr, MD  KLOR-CON M20 20 MEQ tablet TAKE 2 TABLETS (40 MEQ TOTAL) BY MOUTH 2 (TWO) TIMES DAILY. 06/28/17  Yes Isaiah Serge, NP  Melatonin 10 MG CAPS Take 1 capsule by mouth at bedtime as needed (sleep).   Yes [provider]  metoprolol succinate (TOPROL-XL) 25 MG 24 hr  tablet Take 1 tablet (25 mg total) by mouth daily. 07/18/17  Yes Jerline Pain, MD  nortriptyline (PAMELOR) 25 MG capsule Take 50 mg by mouth at bedtime.    Yes [provider]  OXYGEN Inhale 2 L/min into the lungs at bedtime as needed (uses as bedtime everynight and then occasionally iun the daytime as needed for SOB).    Yes [provider]  senna-docusate (SENOKOT-S) 8.6-50 MG tablet Take 1 tablet by mouth 2 (two) times daily.    Yes [provider]  torsemide (DEMADEX) 20 MG tablet Take 4 tablets (80 mg total) every morning and 2 tablets (40 mg total) six hours after morning dosage daily. Patient taking differently: Take 20-80 mg by mouth as directed. Take 4 tablets (80 mg total) every morning and 1 tablet (20 mg total) at 4pm on Tuesday, Thursday, and Saturday ONLY 02/11/17  Yes Jerline Pain, MD  ciprofloxacin (CIPRO) 500  MG tablet Take 1 tablet by mouth every 12 (twelve) hours. Take 1 tablet every 12 hours for 5 days (starting 07/23/17) 07/23/17   [provider]  metolazone (ZAROXOLYN) 2.5 MG tablet Take 1 tablet (2.5 mg total) by mouth once a week. 1 tab every Monday Patient not taking: Reported on 07/28/2017 06/07/17 09/05/17  Isaiah Serge, NP  nitroGLYCERIN (NITROSTAT) 0.4 MG SL tablet Place 1 tablet (0.4 mg total) under the tongue every 5 (five) minutes as needed for chest pain. 11/24/12   Jettie Booze, MD  spironolactone (ALDACTONE) 25 MG tablet Take 0.5 tablets (12.5 mg total) by mouth daily. Patient not taking: Reported on 07/28/2017 01/08/17   Baldwin Jamaica, PA-C    Family History Family History  Problem Relation Age of Onset  . Stroke Mother   . Congestive Heart Failure Mother   . Tuberculosis Father   . Diabetes Brother   . Stroke Brother   . Cancer Brother   . Heart attack Son     Social History Social History   Tobacco Use  . Smoking status: Former Research scientist (life sciences)  . Smokeless tobacco: Former Network engineer Use Topics  . Alcohol  use: No  . Drug use: No     Allergies   Quinapril hcl; Tape; Tessalon [benzonatate]; and Neosporin [neomycin-polymyxin-gramicidin]   Review of Systems Review of Systems  All other systems reviewed and are negative.    Physical Exam Updated Vital Signs BP 110/61   Pulse 83   Temp 99.7 F (37.6 C) (Rectal)   Resp (!) 21   Ht 5\' 6"  (1.676 m)   Wt 90.7 kg (200 lb)   LMP  (LMP Unknown)   SpO2 93%   BMI 32.28 kg/m   Physical Exam  Constitutional: She is oriented to person, place, and time. She appears well-developed and well-nourished. No distress.  HENT:  Head: Normocephalic and atraumatic.  Eyes: EOM are normal. Pupils are equal, round, and reactive to light.  Neck: Normal range of motion.  Cardiovascular: Normal rate, regular rhythm and normal heart sounds.  Pulmonary/Chest: Effort normal and breath sounds normal.  Abdominal: Soft. She exhibits no distension. There is no tenderness.  Musculoskeletal: Normal range of motion.  Neurological: She is alert and oriented to person, place, and time.  5/5 strength in major muscle groups of  bilateral upper and lower extremities. Speech normal. No facial asymetry.   Skin: Skin is warm and dry.  Psychiatric: She has a normal mood and affect. Judgment normal.  Nursing note and vitals reviewed.    ED Treatments / Results  Labs (all labs ordered are listed, but only abnormal results are displayed) Labs Reviewed  PROTIME-INR - Abnormal; Notable for the following components:      Result Value   Prothrombin Time 20.0 (*)    All other components within normal limits  APTT - Abnormal; Notable for the following components:   aPTT 43 (*)    All other components within normal limits  CBC - Abnormal; Notable for the following components:   RBC 3.82 (*)    All other components within normal limits  COMPREHENSIVE METABOLIC PANEL - Abnormal; Notable for the following components:   Glucose, Bld 117 (*)    BUN 25 (*)    Creatinine,  Ser 1.10 (*)    GFR calc non Af Amer 45 (*)    GFR calc Af Amer 53 (*)    All other components within normal limits  URINALYSIS, ROUTINE W REFLEX MICROSCOPIC -  Abnormal; Notable for the following components:   APPearance HAZY (*)    Leukocytes, UA LARGE (*)    Bacteria, UA RARE (*)    Squamous Epithelial / LPF 0-5 (*)    Non Squamous Epithelial 0-5 (*)    All other components within normal limits  BLOOD GAS, ARTERIAL - Abnormal; Notable for the following components:   pO2, Arterial 53.4 (*)    All other components within normal limits  DIFFERENTIAL  AMMONIA  I-STAT TROPONIN, ED  CBG MONITORING, ED    EKG  EKG Interpretation  Date/Time:  Sunday July 28 2017 10:30:19 EST Ventricular Rate:  81 PR Interval:    QRS Duration: 183 QT Interval:  449 QTC Calculation: 522 R Axis:   -109 Text Interpretation:  Sinus rhythm Multiform ventricular premature complexes Nonspecific IVCD with LAD No significant change was found Confirmed by Jola Schmidt [phone]) on 07/28/2017 12:09:47 PM       Radiology Dg Chest 2 View  Result Date: 07/28/2017 CLINICAL DATA:  Fatigue, urinary tract infection EXAM: CHEST  2 VIEW COMPARISON:  07/09/2017 FINDINGS: LEFT-sided pacemaker overlies stable enlarged cardiac silhouette. No effusion, infiltrate or pneumothorax. Degenerate spurring of the spine. Low lung volumes. IMPRESSION: Cardiomegaly and low lung volumes.  No acute findings Electronically Signed   By: Suzy Bouchard M.D.   On: 07/28/2017 12:27    Procedures Procedures (including critical care time)  Medications Ordered in ED Medications  cefTRIAXone (ROCEPHIN) 1 g in dextrose 5 % 50 mL IVPB (not administered)  sodium chloride 0.9 % bolus 500 mL (500 mLs Intravenous New Bag/Given 07/28/17 1419)     Initial Impression / Assessment and Plan / ED Course  I have reviewed the triage vital signs and the nursing notes.  Pertinent labs & imaging results that were available during my care of the  patient were reviewed by me and considered in my medical decision making (see chart for details).     Patient is overall well-appearing.  Her vital signs are stable.  No clear etiology found.  Questionable urinary tract infection.  Urine culture sent.  Patient be treated with Rocephin and home with Keflex.  Close primary care follow-up.  She feels better after IV fluids and therefore this may have represented mild dehydration as a cause of her generalized weakness.  No new rash or signs of zoster noted.  Discharged home in good condition.  Close PCP follow-up.  Patient and family understand to return to the emergency department for new or worsening symptoms  Final Clinical Impressions(s) / ED Diagnoses   Final diagnoses:  None    ED Discharge Orders    None       Jola Schmidt, MD 07/28/17 1521

## 2017-07-28 NOTE — ED Notes (Signed)
Bed: WA13 Expected date:  Expected time:  Means of arrival:  Comments: EMS 

## 2017-07-28 NOTE — ED Triage Notes (Signed)
Pt arrived via [facility] and comes from nursing facility. Pt is on second round of antibiotics for UTI. Staff at facility stated she has been fatigued for about 1 week. Son is in rouet to facility. Pt is AOx4, Walks with walker, pt denies chest pain and SOB. Pt does have hx of CHF. Lung sounds are clear.   EMS assisted pt while at facility to use restroom and noted that there was no smell or discoloration with urine and pt denies pain when urinating.

## 2017-07-30 ENCOUNTER — Encounter (HOSPITAL_COMMUNITY): Payer: Self-pay | Admitting: Emergency Medicine

## 2017-07-30 ENCOUNTER — Other Ambulatory Visit: Payer: Self-pay

## 2017-07-30 ENCOUNTER — Emergency Department (HOSPITAL_COMMUNITY): Payer: PPO

## 2017-07-30 ENCOUNTER — Inpatient Hospital Stay (HOSPITAL_COMMUNITY)
Admission: EM | Admit: 2017-07-30 | Discharge: 2017-08-02 | DRG: 291 | Disposition: A | Discharge status: Home Health | Payer: PPO | Attending: Internal Medicine | Admitting: Internal Medicine

## 2017-07-30 DIAGNOSIS — E876 Hypokalemia: Secondary | ICD-10-CM

## 2017-07-30 DIAGNOSIS — E669 Obesity, unspecified: Secondary | ICD-10-CM | POA: Diagnosis present

## 2017-07-30 DIAGNOSIS — J9601 Acute respiratory failure with hypoxia: Secondary | ICD-10-CM | POA: Diagnosis present

## 2017-07-30 DIAGNOSIS — M1711 Unilateral primary osteoarthritis, right knee: Secondary | ICD-10-CM | POA: Diagnosis present

## 2017-07-30 DIAGNOSIS — I5023 Acute on chronic systolic (congestive) heart failure: Secondary | ICD-10-CM

## 2017-07-30 DIAGNOSIS — I429 Cardiomyopathy, unspecified: Secondary | ICD-10-CM

## 2017-07-30 DIAGNOSIS — Z9989 Dependence on other enabling machines and devices: Secondary | ICD-10-CM

## 2017-07-30 DIAGNOSIS — Z95810 Presence of automatic (implantable) cardiac defibrillator: Secondary | ICD-10-CM

## 2017-07-30 DIAGNOSIS — Z87891 Personal history of nicotine dependence: Secondary | ICD-10-CM

## 2017-07-30 DIAGNOSIS — Z9049 Acquired absence of other specified parts of digestive tract: Secondary | ICD-10-CM | POA: Diagnosis not present

## 2017-07-30 DIAGNOSIS — N183 Chronic kidney disease, stage 3 unspecified: Secondary | ICD-10-CM | POA: Diagnosis present

## 2017-07-30 DIAGNOSIS — Z8249 Family history of ischemic heart disease and other diseases of the circulatory system: Secondary | ICD-10-CM

## 2017-07-30 DIAGNOSIS — R17 Unspecified jaundice: Secondary | ICD-10-CM | POA: Diagnosis present

## 2017-07-30 DIAGNOSIS — I4891 Unspecified atrial fibrillation: Secondary | ICD-10-CM | POA: Diagnosis present

## 2017-07-30 DIAGNOSIS — B372 Candidiasis of skin and nail: Secondary | ICD-10-CM | POA: Diagnosis present

## 2017-07-30 DIAGNOSIS — I130 Hypertensive heart and chronic kidney disease with heart failure and stage 1 through stage 4 chronic kidney disease, or unspecified chronic kidney disease: Principal | ICD-10-CM | POA: Diagnosis present

## 2017-07-30 DIAGNOSIS — R32 Unspecified urinary incontinence: Secondary | ICD-10-CM | POA: Diagnosis present

## 2017-07-30 DIAGNOSIS — I493 Ventricular premature depolarization: Secondary | ICD-10-CM | POA: Diagnosis present

## 2017-07-30 DIAGNOSIS — Z87440 Personal history of urinary (tract) infections: Secondary | ICD-10-CM

## 2017-07-30 DIAGNOSIS — I2510 Atherosclerotic heart disease of native coronary artery without angina pectoris: Secondary | ICD-10-CM | POA: Diagnosis present

## 2017-07-30 DIAGNOSIS — R05 Cough: Secondary | ICD-10-CM | POA: Diagnosis present

## 2017-07-30 DIAGNOSIS — I5043 Acute on chronic combined systolic (congestive) and diastolic (congestive) heart failure: Secondary | ICD-10-CM | POA: Diagnosis present

## 2017-07-30 DIAGNOSIS — L304 Erythema intertrigo: Secondary | ICD-10-CM | POA: Diagnosis present

## 2017-07-30 DIAGNOSIS — Z888 Allergy status to other drugs, medicaments and biological substances status: Secondary | ICD-10-CM | POA: Diagnosis not present

## 2017-07-30 DIAGNOSIS — R4182 Altered mental status, unspecified: Secondary | ICD-10-CM | POA: Diagnosis present

## 2017-07-30 DIAGNOSIS — R410 Disorientation, unspecified: Secondary | ICD-10-CM

## 2017-07-30 DIAGNOSIS — I480 Paroxysmal atrial fibrillation: Secondary | ICD-10-CM | POA: Diagnosis present

## 2017-07-30 DIAGNOSIS — R059 Cough, unspecified: Secondary | ICD-10-CM

## 2017-07-30 DIAGNOSIS — I252 Old myocardial infarction: Secondary | ICD-10-CM | POA: Diagnosis not present

## 2017-07-30 DIAGNOSIS — N390 Urinary tract infection, site not specified: Secondary | ICD-10-CM | POA: Diagnosis present

## 2017-07-30 DIAGNOSIS — Z9981 Dependence on supplemental oxygen: Secondary | ICD-10-CM

## 2017-07-30 DIAGNOSIS — G4733 Obstructive sleep apnea (adult) (pediatric): Secondary | ICD-10-CM | POA: Diagnosis present

## 2017-07-30 DIAGNOSIS — I340 Nonrheumatic mitral (valve) insufficiency: Secondary | ICD-10-CM | POA: Diagnosis present

## 2017-07-30 DIAGNOSIS — Z90710 Acquired absence of both cervix and uterus: Secondary | ICD-10-CM | POA: Diagnosis not present

## 2017-07-30 DIAGNOSIS — Z6832 Body mass index (BMI) 32.0-32.9, adult: Secondary | ICD-10-CM

## 2017-07-30 DIAGNOSIS — R0602 Shortness of breath: Secondary | ICD-10-CM

## 2017-07-30 DIAGNOSIS — I428 Other cardiomyopathies: Secondary | ICD-10-CM | POA: Diagnosis present

## 2017-07-30 DIAGNOSIS — E785 Hyperlipidemia, unspecified: Secondary | ICD-10-CM | POA: Diagnosis present

## 2017-07-30 DIAGNOSIS — Z833 Family history of diabetes mellitus: Secondary | ICD-10-CM

## 2017-07-30 DIAGNOSIS — G629 Polyneuropathy, unspecified: Secondary | ICD-10-CM | POA: Diagnosis present

## 2017-07-30 DIAGNOSIS — Z713 Dietary counseling and surveillance: Secondary | ICD-10-CM

## 2017-07-30 DIAGNOSIS — Z91048 Other nonmedicinal substance allergy status: Secondary | ICD-10-CM

## 2017-07-30 DIAGNOSIS — Z79899 Other long term (current) drug therapy: Secondary | ICD-10-CM

## 2017-07-30 DIAGNOSIS — Z7901 Long term (current) use of anticoagulants: Secondary | ICD-10-CM

## 2017-07-30 DIAGNOSIS — Z823 Family history of stroke: Secondary | ICD-10-CM

## 2017-07-30 LAB — CBC WITH DIFFERENTIAL/PLATELET
Basophils Absolute: 0 10*3/uL (ref 0.0–0.1)
Basophils Relative: 0 %
EOS ABS: 0.2 10*3/uL (ref 0.0–0.7)
Eosinophils Relative: 2 %
HCT: 39.4 % (ref 36.0–46.0)
HEMOGLOBIN: 12.6 g/dL (ref 12.0–15.0)
LYMPHS ABS: 1.9 10*3/uL (ref 0.7–4.0)
LYMPHS PCT: 18 %
MCH: 31.3 pg (ref 26.0–34.0)
MCHC: 32 g/dL (ref 30.0–36.0)
MCV: 97.8 fL (ref 78.0–100.0)
MONOS PCT: 8 %
Monocytes Absolute: 0.8 10*3/uL (ref 0.1–1.0)
NEUTROS PCT: 72 %
Neutro Abs: 7.6 10*3/uL (ref 1.7–7.7)
Platelets: 210 10*3/uL (ref 150–400)
RBC: 4.03 MIL/uL (ref 3.87–5.11)
RDW: 14.6 % (ref 11.5–15.5)
WBC: 10.6 10*3/uL — ABNORMAL HIGH (ref 4.0–10.5)

## 2017-07-30 LAB — URINALYSIS, ROUTINE W REFLEX MICROSCOPIC
BILIRUBIN URINE: NEGATIVE
Bacteria, UA: NONE SEEN
Glucose, UA: NEGATIVE mg/dL
Ketones, ur: NEGATIVE mg/dL
LEUKOCYTES UA: NEGATIVE
NITRITE: NEGATIVE
PH: 6 (ref 5.0–8.0)
Protein, ur: NEGATIVE mg/dL
SQUAMOUS EPITHELIAL / LPF: NONE SEEN
Specific Gravity, Urine: 1.006 (ref 1.005–1.030)

## 2017-07-30 LAB — BLOOD GAS, VENOUS

## 2017-07-30 LAB — MAGNESIUM: Magnesium: 1.9 mg/dL (ref 1.7–2.4)

## 2017-07-30 LAB — COMPREHENSIVE METABOLIC PANEL
ALT: 15 U/L (ref 14–54)
ANION GAP: 16 — AB (ref 5–15)
AST: 23 U/L (ref 15–41)
Albumin: 3.8 g/dL (ref 3.5–5.0)
Alkaline Phosphatase: 65 U/L (ref 38–126)
BILIRUBIN TOTAL: 1.7 mg/dL — AB (ref 0.3–1.2)
BUN: 24 mg/dL — ABNORMAL HIGH (ref 6–20)
CO2: 26 mmol/L (ref 22–32)
Calcium: 9.2 mg/dL (ref 8.9–10.3)
Chloride: 98 mmol/L — ABNORMAL LOW (ref 101–111)
Creatinine, Ser: 1.16 mg/dL — ABNORMAL HIGH (ref 0.44–1.00)
GFR, EST AFRICAN AMERICAN: 49 mL/min — AB (ref >60)
GFR, EST NON AFRICAN AMERICAN: 43 mL/min — AB (ref >60)
Glucose, Bld: 99 mg/dL (ref 65–99)
POTASSIUM: 2.5 mmol/L — AB (ref 3.5–5.1)
Sodium: 140 mmol/L (ref 135–145)
TOTAL PROTEIN: 6.5 g/dL (ref 6.5–8.1)

## 2017-07-30 LAB — I-STAT VENOUS BLOOD GAS, ED
Acid-Base Excess: 6 mmol/L — ABNORMAL HIGH (ref 0.0–2.0)
BICARBONATE: 30.4 mmol/L — AB (ref 20.0–28.0)
O2 SAT: 49 %
TCO2: 32 mmol/L (ref 22–32)
pCO2, Ven: 43.3 mmHg — ABNORMAL LOW (ref 44.0–60.0)
pH, Ven: 7.455 — ABNORMAL HIGH (ref 7.250–7.430)
pO2, Ven: 25 mmHg — CL (ref 32.0–45.0)

## 2017-07-30 LAB — BRAIN NATRIURETIC PEPTIDE: B Natriuretic Peptide: 1664.3 pg/mL — ABNORMAL HIGH (ref 0.0–100.0)

## 2017-07-30 LAB — I-STAT TROPONIN, ED: Troponin i, poc: 0.05 ng/mL (ref 0.00–0.08)

## 2017-07-30 LAB — CBG MONITORING, ED: GLUCOSE-CAPILLARY: 91 mg/dL (ref 65–99)

## 2017-07-30 LAB — I-STAT CG4 LACTIC ACID, ED
LACTIC ACID, VENOUS: 1.36 mmol/L (ref 0.5–1.9)
Lactic Acid, Venous: 1.52 mmol/L (ref 0.5–1.9)

## 2017-07-30 MED ORDER — ALLOPURINOL 300 MG PO TABS
400.0000 mg | ORAL_TABLET | Freq: Every day | ORAL | Status: DC
  Administered 2017-07-30 – 2017-08-02 (×4): 400 mg via ORAL
  Filled 2017-07-30 (×2): qty 1
  Filled 2017-07-30: qty 4
  Filled 2017-07-30 (×2): qty 1

## 2017-07-30 MED ORDER — NORTRIPTYLINE HCL 25 MG PO CAPS
50.0000 mg | ORAL_CAPSULE | Freq: Every day | ORAL | Status: DC
Start: 2017-07-30 — End: 2017-08-02
  Administered 2017-07-30 – 2017-08-01 (×3): 50 mg via ORAL
  Filled 2017-07-30 (×3): qty 2

## 2017-07-30 MED ORDER — SODIUM CHLORIDE 0.9% FLUSH
3.0000 mL | Freq: Two times a day (BID) | INTRAVENOUS | Status: DC
  Administered 2017-07-31 – 2017-08-02 (×5): 3 mL via INTRAVENOUS

## 2017-07-30 MED ORDER — SODIUM CHLORIDE 0.9% FLUSH: 3.0000 mL | INTRAVENOUS | Status: DC | PRN

## 2017-07-30 MED ORDER — HYDRALAZINE HCL 10 MG PO TABS
10.0000 mg | ORAL_TABLET | Freq: Three times a day (TID) | ORAL | Status: DC
  Administered 2017-07-31 – 2017-08-02 (×7): 10 mg via ORAL
  Filled 2017-07-30 (×10): qty 1

## 2017-07-30 MED ORDER — CHLORHEXIDINE GLUCONATE 0.12 % MT SOLN
15.0000 mL | Freq: Two times a day (BID) | OROMUCOSAL | Status: DC
  Administered 2017-07-31 – 2017-08-02 (×5): 15 mL via OROMUCOSAL
  Filled 2017-07-30 (×5): qty 15

## 2017-07-30 MED ORDER — NYSTATIN 100000 UNIT/GM EX POWD: Freq: Three times a day (TID) | CUTANEOUS | Status: DC

## 2017-07-30 MED ORDER — APIXABAN 5 MG PO TABS
5.0000 mg | ORAL_TABLET | Freq: Two times a day (BID) | ORAL | Status: DC
  Administered 2017-07-30 – 2017-08-02 (×6): 5 mg via ORAL
  Filled 2017-07-30 (×8): qty 1

## 2017-07-30 MED ORDER — CALCIUM/VITAMIN D/MINERALS 600-200 MG-UNIT PO TABS: 1.0000 | ORAL_TABLET | Freq: Two times a day (BID) | ORAL | Status: DC

## 2017-07-30 MED ORDER — GUAIFENESIN ER 600 MG PO TB12
600.0000 mg | ORAL_TABLET | Freq: Two times a day (BID) | ORAL | Status: DC
  Administered 2017-07-30 – 2017-08-02 (×6): 600 mg via ORAL
  Filled 2017-07-30 (×6): qty 1

## 2017-07-30 MED ORDER — SODIUM CHLORIDE 0.9 % IV SOLN: 250.0000 mL | INTRAVENOUS | Status: DC | PRN

## 2017-07-30 MED ORDER — VITAMIN D 1000 UNITS PO TABS
1000.0000 [IU] | ORAL_TABLET | Freq: Every day | ORAL | Status: DC
  Administered 2017-07-31 – 2017-08-02 (×3): 1000 [IU] via ORAL
  Filled 2017-07-30 (×4): qty 1

## 2017-07-30 MED ORDER — GABAPENTIN 400 MG PO CAPS
800.0000 mg | ORAL_CAPSULE | Freq: Three times a day (TID) | ORAL | Status: DC
  Administered 2017-07-30 – 2017-07-31 (×2): 800 mg via ORAL
  Filled 2017-07-30 (×3): qty 2

## 2017-07-30 MED ORDER — ORAL CARE MOUTH RINSE
15.0000 mL | Freq: Two times a day (BID) | OROMUCOSAL | Status: DC
  Administered 2017-07-31 – 2017-08-01 (×3): 15 mL via OROMUCOSAL

## 2017-07-30 MED ORDER — SPIRONOLACTONE 12.5 MG HALF TABLET
12.5000 mg | ORAL_TABLET | Freq: Every day | ORAL | Status: DC
  Administered 2017-07-31 – 2017-08-02 (×3): 12.5 mg via ORAL
  Filled 2017-07-30 (×3): qty 1

## 2017-07-30 MED ORDER — CALCIUM CARBONATE-VITAMIN D 500-200 MG-UNIT PO TABS
1.0000 | ORAL_TABLET | Freq: Two times a day (BID) | ORAL | Status: DC
  Administered 2017-07-30 – 2017-08-02 (×6): 1 via ORAL
  Filled 2017-07-30 (×8): qty 1

## 2017-07-30 MED ORDER — ONDANSETRON HCL 4 MG/2ML IJ SOLN: 4.0000 mg | Freq: Four times a day (QID) | INTRAMUSCULAR | Status: DC | PRN

## 2017-07-30 MED ORDER — FUROSEMIDE 10 MG/ML IJ SOLN
60.0000 mg | Freq: Once | INTRAMUSCULAR | Status: AC
  Administered 2017-07-30: 60 mg via INTRAVENOUS
  Filled 2017-07-30: qty 6

## 2017-07-30 MED ORDER — POTASSIUM CHLORIDE CRYS ER 20 MEQ PO TBCR
40.0000 meq | EXTENDED_RELEASE_TABLET | Freq: Once | ORAL | Status: AC
  Administered 2017-07-30: 40 meq via ORAL
  Filled 2017-07-30: qty 2

## 2017-07-30 MED ORDER — CEPHALEXIN 500 MG PO CAPS
500.0000 mg | ORAL_CAPSULE | Freq: Three times a day (TID) | ORAL | Status: DC
  Administered 2017-07-30 – 2017-08-02 (×8): 500 mg via ORAL
  Filled 2017-07-30 (×8): qty 1

## 2017-07-30 MED ORDER — ATORVASTATIN CALCIUM 10 MG PO TABS
10.0000 mg | ORAL_TABLET | Freq: Every day | ORAL | Status: DC
  Administered 2017-07-30 – 2017-08-01 (×3): 10 mg via ORAL
  Filled 2017-07-30 (×5): qty 1

## 2017-07-30 MED ORDER — ISOSORBIDE MONONITRATE ER 30 MG PO TB24
30.0000 mg | ORAL_TABLET | Freq: Every day | ORAL | Status: DC
  Administered 2017-07-31 – 2017-08-02 (×3): 30 mg via ORAL
  Filled 2017-07-30 (×3): qty 1

## 2017-07-30 MED ORDER — COLESTIPOL HCL 1 G PO TABS
2.0000 g | ORAL_TABLET | Freq: Every day | ORAL | Status: DC
  Administered 2017-07-30 – 2017-08-01 (×3): 2 g via ORAL
  Filled 2017-07-30 (×3): qty 2

## 2017-07-30 MED ORDER — FUROSEMIDE 10 MG/ML IJ SOLN
80.0000 mg | Freq: Two times a day (BID) | INTRAMUSCULAR | Status: DC
  Administered 2017-07-31 – 2017-08-02 (×5): 80 mg via INTRAVENOUS
  Filled 2017-07-30 (×5): qty 8

## 2017-07-30 MED ORDER — ALLOPURINOL 300 MG PO TABS: 300.0000 mg | ORAL_TABLET | ORAL | Status: DC

## 2017-07-30 MED ORDER — POTASSIUM CHLORIDE CRYS ER 20 MEQ PO TBCR
40.0000 meq | EXTENDED_RELEASE_TABLET | Freq: Two times a day (BID) | ORAL | Status: DC
  Administered 2017-07-30 – 2017-08-02 (×6): 40 meq via ORAL
  Filled 2017-07-30 (×6): qty 2

## 2017-07-30 MED ORDER — FUROSEMIDE 10 MG/ML IJ SOLN
80.0000 mg | Freq: Two times a day (BID) | INTRAMUSCULAR | Status: DC
  Administered 2017-07-30: 80 mg via INTRAVENOUS
  Filled 2017-07-30: qty 8

## 2017-07-30 MED ORDER — METOPROLOL SUCCINATE ER 25 MG PO TB24
25.0000 mg | ORAL_TABLET | Freq: Every day | ORAL | Status: DC
  Administered 2017-07-30: 25 mg via ORAL
  Filled 2017-07-30 (×3): qty 1

## 2017-07-30 MED ORDER — POTASSIUM CHLORIDE 10 MEQ/100ML IV SOLN
10.0000 meq | Freq: Once | INTRAVENOUS | Status: AC
  Administered 2017-07-30: 10 meq via INTRAVENOUS
  Filled 2017-07-30: qty 100

## 2017-07-30 MED ORDER — FLUCONAZOLE IN SODIUM CHLORIDE 200-0.9 MG/100ML-% IV SOLN
200.0000 mg | Freq: Once | INTRAVENOUS | Status: AC
  Administered 2017-07-30: 200 mg via INTRAVENOUS
  Filled 2017-07-30: qty 100

## 2017-07-30 MED ORDER — ACETAMINOPHEN 325 MG PO TABS
650.0000 mg | ORAL_TABLET | ORAL | Status: DC | PRN
  Administered 2017-07-30 – 2017-08-01 (×4): 650 mg via ORAL
  Filled 2017-07-30 (×4): qty 2

## 2017-07-30 NOTE — ED Triage Notes (Signed)
Pt in from [place] via [facility] with AMS, recurrent UTI's and weakness. EMS states pt has been treated for ongoing UTI, started Keflex on 2/12. C/o urinary frequency, hx of CHF. A&ox3 on arrival, denies fever or sob

## 2017-07-30 NOTE — ED Notes (Signed)
In and out preformed for urine sample collection

## 2017-07-30 NOTE — ED Provider Notes (Signed)
[name] EMERGENCY DEPARTMENT Provider Note  CSN: [id number] Arrival date & time: 07/30/17 9563  Chief Complaint(s) Altered Mental Status; Recurrent UTI; and Weakness  HPI Bianca [name] is a 84 y.o. female   The history is provided by a relative.  Altered Mental Status   This is a new problem. Episode onset: 3 weeks. The problem has not changed since onset.Associated symptoms include confusion. Risk factors include a recent illness (treated for UTI). Her past medical history is significant for heart disease.     Son also reports worsening leg swelling, and has noticed DOE and orthopnea  Patient was seen in the emergency department for altered mental status 3 weeks ago and diagnosed with UTI, placed on Keflex.  CT head at that time was negative.  Other labs were grossly reassuring. Seen by PCP for follow up who extended the course for 5 days, last dose was 3 days ago.She was seen again 2 days ago in the ED for continue AMS and placed back on Keflex after IV CTX. Culture grew out cephlosporing susceptible E.coli on 1/22. UA unchaged in last ED visit.  Past Medical History Past Medical History:  Diagnosis Date  . Acute blood loss anemia   . Arthritis   . CAD (coronary artery disease)/LBBB 01/01/2017  . Chronic systolic heart failure ([place])   . CKD (chronic kidney disease), stage III ([place]) 12/22/2015  . Depression 11/24/2015  . Hypertension   . Hypokalemia   . Myocardial infarction ([place]) 2008  . Neuromuscular disorder ([place])   . Paroxysmal atrial fibrillation (HCC)   . Physical deconditioning   . Right flank hematoma   . Sleep apnea    Patient Active Problem List   Diagnosis Date Noted  . Acute on chronic systolic heart failure, NYHA class 1 ([place]) 01/01/2017  . Hypertension 01/01/2017  . Sleep apnea 01/01/2017  . CAD (coronary artery disease)/LBBB 01/01/2017  . History of ventricular tachycardia 01/01/2017  . PAF (paroxysmal atrial fibrillation) ([place])  01/01/2017  . CKD (chronic kidney disease) stage 3, GFR 30-59 ml/min (HCC) 01/01/2017  . Peripheral neuropathy 01/01/2017  . OSA on CPAP 01/01/2017  . Obesity (BMI 30-39.9) 01/01/2017  . ICD (implantable cardioverter-defibrillator) in place 01/01/2017  . Acute combined systolic and diastolic congestive heart failure ([place]) 01/01/2017  . Acute on chronic systolic CHF (congestive heart failure) ([place])   . Dyspnea   . Ventricular tachycardia ([place])   . Subclinical hypothyroidism 12/25/2015  . Acute on chronic diastolic congestive heart failure ([place])   . CKD (chronic kidney disease), stage III ([place]) 12/22/2015  . Hypoxia 12/22/2015  . Hypoxemia 12/21/2015  . CHF exacerbation ([place]) 11/24/2015  . Physical deconditioning 11/24/2015  . Hyperglycemia 11/24/2015  . Peripheral neuropathy 11/24/2015  . HLD (hyperlipidemia) 11/24/2015  . Depression 11/24/2015  . Chest pain 11/24/2015  . Neuropathy involving both lower extremities   . Paroxysmal atrial fibrillation (HCC)   . OSA (obstructive sleep apnea)   . E. coli UTI   . Subcutaneous hematoma   . Fall 10/25/2015  . Afib ([place]) 01/19/2015  . Long-term (current) use of anticoagulants 07/20/2014  . Persistent atrial fibrillation ([place]) 07/20/2014  . Encounter for therapeutic drug monitoring 07/16/2013  . Chronic systolic heart failure ([place]) 07/16/2013  . History of implantable cardioverter-defibrillator (ICD) placement 07/16/2013  . Morbid obesity ([place]) 07/16/2013  . Chronic anticoagulation 07/16/2013  . Knee osteoarthritis 07/16/2013  . HYPOKALEMIA 08/09/2010  . Neuropathy ([place]) 08/09/2010  . Chronic diastolic (congestive) heart failure ([place]) 08/09/2010  .  Automatic implantable cardioverter-defibrillator in situ 08/09/2010   Home Medication(s) Prior to Admission medications   Medication Sig Start Date End Date Taking? Authorizing Provider  acetaminophen (TYLENOL) 325 MG tablet Take 325-650 mg by mouth 2 (two) times daily as needed for mild  pain or headache.     [provider]  acidophilus (RISAQUAD) CAPS capsule Take 1 capsule by mouth daily.    [provider]  allopurinol (ZYLOPRIM) 100 MG tablet Take 100 mg by mouth See admin instructions. Takes along with a 300 mg tablet to equal 400 mg     [provider]  allopurinol (ZYLOPRIM) 300 MG tablet Take 300 mg by mouth See admin instructions. Take with the 100mg  tablet to equal 400mg  daily     [provider]  atorvastatin (LIPITOR) 10 MG tablet TAKE 1 TABLET (10 MG TOTAL) BY MOUTH DAILY AT 6 PM. 07/10/17   Jerline Pain, MD  Calcium Carbonate-Vit D-Min (CALCIUM/VITAMIN D/MINERALS) 600-200 MG-UNIT TABS Take 1 tablet by mouth 2 (two) times daily.     [provider]  cephALEXin (KEFLEX) 500 MG capsule Take 1 capsule (500 mg total) by mouth 3 (three) times daily. 07/28/17   Jola Schmidt, MD  Cholecalciferol (VITAMIN D3) 1000 UNITS CAPS Take 1 capsule by mouth daily.    [provider]  ciprofloxacin (CIPRO) 500 MG tablet Take 1 tablet by mouth every 12 (twelve) hours. Take 1 tablet every 12 hours for 5 days (starting 07/23/17) 07/23/17   [provider]  colestipol (COLESTID) 1 G tablet Take 2 g by mouth at bedtime.     [provider]  diphenhydramine-acetaminophen (TYLENOL PM) 25-500 MG TABS tablet Take 1 tablet by mouth at bedtime as needed (SLEEP).    [provider]  ELIQUIS 5 MG TABS tablet TAKE 1 TABLET TWICE DAILY 06/07/17   Larey Dresser, MD  gabapentin (NEURONTIN) 800 MG tablet Take 800 mg by mouth 3 (three) times daily.    [provider]  guaiFENesin (MUCINEX) 600 MG 12 hr tablet Take 1 tablet (600 mg total) by mouth 2 (two) times daily. 11/29/15   Theodis Blaze, MD  isosorbide mononitrate (IMDUR) 30 MG 24 hr tablet TAKE 1 TABLET BY MOUTH EVERY DAY 01/28/17   Jerline Pain, MD  KLOR-CON M20 20 MEQ tablet TAKE 2 TABLETS (40 MEQ TOTAL) BY MOUTH 2 (TWO) TIMES DAILY. 06/28/17   Isaiah Serge,  NP  Melatonin 10 MG CAPS Take 1 capsule by mouth at bedtime as needed (sleep).    [provider]  metolazone (ZAROXOLYN) 2.5 MG tablet Take 1 tablet (2.5 mg total) by mouth once a week. 1 tab every Monday Patient not taking: Reported on 07/28/2017 06/07/17 09/05/17  Isaiah Serge, NP  metoprolol succinate (TOPROL-XL) 25 MG 24 hr tablet Take 1 tablet (25 mg total) by mouth daily. 07/18/17   Jerline Pain, MD  nitroGLYCERIN (NITROSTAT) 0.4 MG SL tablet Place 1 tablet (0.4 mg total) under the tongue every 5 (five) minutes as needed for chest pain. 11/24/12   Jettie Booze, MD  nortriptyline (PAMELOR) 25 MG capsule Take 50 mg by mouth at bedtime.     [provider]  OXYGEN Inhale 2 L/min into the lungs at bedtime as needed (uses as bedtime everynight and then occasionally iun the daytime as needed for SOB).     [provider]  senna-docusate (SENOKOT-S) 8.6-50 MG tablet Take 1 tablet by mouth 2 (two) times daily.  [provider]  spironolactone (ALDACTONE) 25 MG tablet Take 0.5 tablets (12.5 mg total) by mouth daily. Patient not taking: Reported on 07/28/2017 01/08/17   Baldwin Jamaica, PA-C  torsemide (DEMADEX) 20 MG tablet Take 4 tablets (80 mg total) every morning and 2 tablets (40 mg total) six hours after morning dosage daily. Patient taking differently: Take 20-80 mg by mouth as directed. Take 4 tablets (80 mg total) every morning and 1 tablet (20 mg total) at 4pm on Tuesday, Thursday, and Saturday ONLY 02/11/17   Jerline Pain, MD                                                                                                                                    Past Surgical History Past Surgical History:  Procedure Laterality Date  . ABDOMINAL HYSTERECTOMY    . ANKLE SURGERY    . CARDIAC CATHETERIZATION    . CHOLECYSTECTOMY    . EP IMPLANTABLE DEVICE N/A 02/29/2016   Procedure: BIV ICD Generator Changeout;  Surgeon: Evans Lance, MD;  Location:  [facility];  Service: Cardiovascular;  Laterality: N/A;  . EYE SURGERY    . INSERT / REPLACE / REMOVE PACEMAKER  2008  . KNEE LIGAMENT RECONSTRUCTION  2012  . TONSILLECTOMY     Family History Family History  Problem Relation Age of Onset  . Stroke Mother   . Congestive Heart Failure Mother   . Tuberculosis Father   . Diabetes Brother   . Stroke Brother   . Cancer Brother   . Heart attack Son     Social History Social History   Tobacco Use  . Smoking status: Former Research scientist (life sciences)  . Smokeless tobacco: Former Network engineer Use Topics  . Alcohol use: No  . Drug use: No   Allergies Quinapril hcl; Tape; Tessalon [benzonatate]; and Neosporin [neomycin-polymyxin-gramicidin]  Review of Systems Review of Systems  Unable to perform ROS: Mental status change  Psychiatric/Behavioral: Positive for confusion.   All other systems are reviewed and are negative for acute change except as noted in the HPI  Physical Exam Vital Signs  I have reviewed the triage vital signs BP 111/86   Pulse 84   Resp (!) 21   Wt 90.7 kg (200 lb)   LMP  (LMP Unknown)   SpO2 97%   BMI 32.28 kg/m  All other systems are reviewed and are negative for acute change except as noted in the HPI  Physical Exam  Constitutional: She appears well-developed and well-nourished. No distress.  HENT:  Head: Normocephalic and atraumatic.  Nose: Nose normal.  Eyes: Conjunctivae and EOM are normal. Pupils are equal, round, and reactive to light. Right eye exhibits no discharge. Left eye exhibits no discharge. No scleral icterus.  Neck: Normal range of motion. Neck supple.  Cardiovascular: Normal rate and regular rhythm. Exam reveals no gallop and no friction rub.  No murmur heard.  Pulmonary/Chest: Effort normal and breath sounds normal. No stridor. No respiratory distress. She has no rales.  Abdominal: Soft. She exhibits no distension. There is no tenderness. There is no rigidity, no rebound and no guarding.     Musculoskeletal: She exhibits no edema or tenderness.  Neurological: She is alert.  A&O x3 but believes her son is her husband even after several attempts to clarify.  Skin: Skin is warm and dry. No rash noted. She is not diaphoretic. No erythema.  Psychiatric: She has a normal mood and affect.  Vitals reviewed.   ED Results and Treatments Labs (all labs ordered are listed, but only abnormal results are displayed) Labs Reviewed  COMPREHENSIVE METABOLIC PANEL - Abnormal; Notable for the following components:      Result Value   Potassium 2.5 (*)    Chloride 98 (*)    BUN 24 (*)    Creatinine, Ser 1.16 (*)    Total Bilirubin 1.7 (*)    GFR calc non Af Amer 43 (*)    GFR calc Af Amer 49 (*)    Anion gap 16 (*)    All other components within normal limits  URINALYSIS, ROUTINE W REFLEX MICROSCOPIC - Abnormal; Notable for the following components:   Color, Urine STRAW (*)    Hgb urine dipstick MODERATE (*)    All other components within normal limits  CBC WITH DIFFERENTIAL/PLATELET - Abnormal; Notable for the following components:   WBC 10.6 (*)    All other components within normal limits  BRAIN NATRIURETIC PEPTIDE - Abnormal; Notable for the following components:   B Natriuretic Peptide 1,664.3 (*)    All other components within normal limits  I-STAT VENOUS BLOOD GAS, ED - Abnormal; Notable for the following components:   pH, Ven 7.455 (*)    pCO2, Ven 43.3 (*)    pO2, Ven 25.0 (*)    Bicarbonate 30.4 (*)    Acid-Base Excess 6.0 (*)    All other components within normal limits  URINE CULTURE  BLOOD GAS, VENOUS  CBG MONITORING, ED  I-STAT CG4 LACTIC ACID, ED  I-STAT TROPONIN, ED  I-STAT CG4 LACTIC ACID, ED                                                                                                                         EKG  EKG Interpretation  Date/Time:  Tuesday July 30 2017 08:15:19 EST Ventricular Rate:  81 PR Interval:    QRS Duration: 198 QT  Interval:  508 QTC Calculation: 568 R Axis:   -135 Text Interpretation:  Sinus rhythm Atrial premature complex Right bundle branch block No significant change since last tracing Confirmed by Addison Lank [phone]) on 07/30/2017 8:29:45 AM      Radiology Dg Chest 2 View  Result Date: 07/30/2017 CLINICAL DATA:  Cough, weakness, coronary disease post MI, stage III chronic kidney disease, chronic systolic heart failure, hypertension, paroxysmal atrial fibrillation EXAM: CHEST  2 VIEW COMPARISON:  07/28/2017 FINDINGS:  LEFT subclavian AICD leads project at RIGHT atrium, RIGHT ventricle and coronary sinus. Enlargement of cardiac silhouette with pulmonary vascular congestion. Mild interstitial infiltrates new since previous exam likely reflecting mild pulmonary edema. No gross pleural effusion or pneumothorax. Numerous EKG leads project over chest. No acute osseous findings. IMPRESSION: Mild CHF. Electronically Signed   By: Lavonia Dana M.D.   On: 07/30/2017 09:26   Pertinent labs & imaging results that were available during my care of the patient were reviewed by me and considered in my medical decision making (see chart for details).  Medications Ordered in ED Medications  potassium chloride 10 mEq in 100 mL IVPB (10 mEq Intravenous New Bag/Given 07/30/17 1054)  potassium chloride SA (K-DUR,KLOR-CON) CR tablet 40 mEq (40 mEq Oral Given 07/30/17 1126)  furosemide (LASIX) injection 60 mg (60 mg Intravenous Given 07/30/17 1126)                                                                                                                                    Procedures Procedures  (including critical care time)  Medical Decision Making / ED Course I have reviewed the nursing notes for this encounter and the patient's prior records (if available in EHR or on provided paperwork).    Altered mental status in the setting of recent urinary tract infection.  Patient is alert and oriented x3 but believes her  son is her husband.  They have also expressed symptoms concerning for CHF exacerbation.  They deny any recent trauma or change in her mental status since the last CT scan.  Workup here is consistent with CHF exacerbation.  She is also noted to have hypokalemia likely due to diuresing.  Potassium was repleted IV and orally.  She was also given IV Lasix.  Patient is not hypoxic on room air.  Chest x-ray without evidence of pulmonary edema.  Will discuss case with medicine for admission.  UA appears clean.  Culture was sent.  Patient continue the treatment and patient until cultures result.   Patient also noted to have erythema to the pannus likely fungal infection.  Can start treating with topical antifungal cream.   Final Clinical Impression(s) / ED Diagnoses Final diagnoses:  Cough  Confusion  Hypokalemia  Acute on chronic combined systolic and diastolic congestive heart failure ([place])      This chart was dictated using voice recognition software.  Despite best efforts to proofread,  errors can occur which can change the documentation meaning.   Fatima Blank, MD 07/30/17 1730

## 2017-07-30 NOTE — Care Management Note (Signed)
Case Management Note  Patient Details  Name: Bianca [name] MRN: [id number] Date of Birth: 19-Sep-1932  Subjective/Objective:                  84 y.o. female PMHx: nonischemic cardiomyopathy, chronic combined systolic and diastolic heart failure, ICD/biventricular pacemaker, sleep apnea, stage III chronic kidney disease, dyslipidemia, obesity, and PAF.  Patient also has difficulty mobilizing secondary to peripheral neuropathy and severe right knee arthritis.  Patient has had recent issues over the past 4-6 weeks with altered mentation.  Presented to the ER on 1/22 and again on 2/10 with a UTI. From [facility].  Action/Plan: Admit status INPATIENT (Acute on chronic combined systolic and diastolic heart failure); anticipate discharge [facility].   Expected Discharge Date:  (TBD)               Expected Discharge Plan:  Assisted Living / Rest Home  In-House Referral:  Clinical Social Work  Discharge planning Services  CM Consult  Status of Service:  In process, will continue to follow  If discussed at Long Length of Stay Meetings, dates discussed:    Additional Comments:  Fuller Mandril, RN 07/30/2017, 2:06 PM

## 2017-07-30 NOTE — ED Notes (Signed)
Pt O2 at 87% RA while resting, placed on 2L [employer].

## 2017-07-30 NOTE — Consult Note (Signed)
Cardiology Consultation:   Patient ID: RAMINA HULET; [id number]; Apr 30, 1933   Admit date: 07/30/2017 Date of Consult: 07/30/2017  Primary Care Provider: Maurice Small, MD Primary Cardiologist: Dr. Candee Furbish  Patient Profile:   Bianca [name] is a 84 y.o. female with a hx of nonischemic cardiomyopathy (per cath 2018) with chronic combined systolic and diastolic heart failure (EF 20% with grade II DD 04/2017), ICD/biventricular pacemaker for CRT, sleep apnea on CPAP, chronic kidney disease stage III, dyslipidemia, obesity, and PAF on chronic Eliquis who is being seen today for the evaluation of symptomatic heart failure at the request of Dr. Venora Maples.  Of note, she has a long cardiac history in which she is followed by Dr. Marlou Porch and Dr. Lovena Le (for ICD implantation), last seen 07/18/17 with Dr. Marlou Porch. According to the office note, she had multiple CHF exacerbation admissions in 2018, prompting further testing with an echocardiogram which showed her decreased EF and subsequent nuclear stress test which was considered a high-risk study due to the presence of a large inferolateral scar and severely depressed overall left ventricular systolic function, consistent with her echo results. However, overall during that follow-up visit, she was doing well.   History of Present Illness:   Ms. Pasko is a pleasant 84 year old female with a history outlined above who has had recent issues over the past 4-6 weeks with altered mentation in which she has been seen in the emergency department on 07/09/17 and again on 07/28/17, felt to have UTI as the likely the culprit of her altered mental status. Each evaluation she was treated with Keflex for a positive urine culture. Unfortunately her symptoms of confusion have continued despite antibiotic therapy; prompting her family to take her for further evaluation per PCP visit and ED evaluation.  On 07/30/17, in the emergency department, she has been afebrile and  normotensive. On room air she was noted to have O2 saturations of 86%. A chest x-ray was completed which showed mild edema/CHF.  Her BNP level was elevated at greater than 1600.  On exam she has peripheral edema 2+ bilaterally and was given IV Lasix x2 and will likely resume home regimen of Demadex, Aldactone, and weekly metolazone.  An echocardiogram was ordered with pending results.  Her previous EF per echocardiogram in November 2018 was 20% with diffuse hypokinesis and grade 2 diastolic dysfunction with moderate MR.  Past Medical History:  Diagnosis Date  . Acute blood loss anemia   . Arthritis   . CAD (coronary artery disease)/LBBB 01/01/2017  . Chronic systolic heart failure ([place])   . CKD (chronic kidney disease), stage III ([place]) 12/22/2015  . Depression 11/24/2015  . Hypertension   . Hypokalemia   . Myocardial infarction ([place]) 2008  . Neuromuscular disorder ([place])   . Paroxysmal atrial fibrillation ([facility])   . Physical deconditioning   . Right flank hematoma   . Sleep apnea     Past Surgical History:  Procedure Laterality Date  . ABDOMINAL HYSTERECTOMY    . ANKLE SURGERY    . CARDIAC CATHETERIZATION    . CHOLECYSTECTOMY    . EP IMPLANTABLE DEVICE N/A 02/29/2016   Procedure: BIV ICD Generator Changeout;  Surgeon: Evans Lance, MD;  Location: [facility];  Service: Cardiovascular;  Laterality: N/A;  . EYE SURGERY    . INSERT / REPLACE / REMOVE PACEMAKER  2008  . KNEE LIGAMENT RECONSTRUCTION  2012  . TONSILLECTOMY       Prior to Admission medications  Medication Sig Start Date End Date Taking? Authorizing Provider  acetaminophen (TYLENOL) 325 MG tablet Take 325-650 mg by mouth 2 (two) times daily as needed for mild pain or headache.    Yes [provider]  allopurinol (ZYLOPRIM) 100 MG tablet Take 100 mg by mouth See admin instructions. Takes along with a 300 mg tablet to equal 400 mg    Yes [provider]  allopurinol (ZYLOPRIM) 300 MG tablet Take 300 mg  by mouth See admin instructions. Take with the 100mg  tablet to equal 400mg  daily    Yes [provider]  atorvastatin (LIPITOR) 10 MG tablet TAKE 1 TABLET (10 MG TOTAL) BY MOUTH DAILY AT 6 PM. 07/10/17  Yes Jerline Pain, MD  bisoprolol (ZEBETA) 5 MG tablet Take 2.5 mg by mouth at bedtime.   Yes [provider]  Calcium Carbonate-Vit D-Min (CALCIUM/VITAMIN D/MINERALS) 600-200 MG-UNIT TABS Take 1 tablet by mouth 2 (two) times daily.    Yes [provider]  cephALEXin (KEFLEX) 500 MG capsule Take 1 capsule (500 mg total) by mouth 3 (three) times daily. 07/28/17  Yes Jola Schmidt, MD  Cholecalciferol (VITAMIN D3) 1000 UNITS CAPS Take 1,000 Units by mouth daily.    Yes [provider]  colestipol (COLESTID) 1 G tablet Take 2 g by mouth at bedtime.    Yes [provider]  diphenhydramine-acetaminophen (TYLENOL PM) 25-500 MG TABS tablet Take 1 tablet by mouth at bedtime as needed (SLEEP).   Yes [provider]  ELIQUIS 5 MG TABS tablet TAKE 1 TABLET TWICE DAILY Patient taking differently: 5mg  by mouth twice daily 06/07/17  Yes Larey Dresser, MD  gabapentin (NEURONTIN) 800 MG tablet Take 800 mg by mouth 3 (three) times daily.   Yes [provider]  guaiFENesin (MUCINEX) 600 MG 12 hr tablet Take 1 tablet (600 mg total) by mouth 2 (two) times daily. 11/29/15  Yes Theodis Blaze, MD  isosorbide mononitrate (IMDUR) 30 MG 24 hr tablet TAKE 1 TABLET BY MOUTH EVERY DAY Patient taking differently: 30mg  by mouth daily in the morning 01/28/17  Yes Skains, Thana Farr, MD  KLOR-CON M20 20 MEQ tablet TAKE 2 TABLETS (40 MEQ TOTAL) BY MOUTH 2 (TWO) TIMES DAILY. 06/28/17  Yes Isaiah Serge, NP  Melatonin 10 MG CAPS Take 1 capsule by mouth at bedtime as needed (sleep).   Yes [provider]  metolazone (ZAROXOLYN) 2.5 MG tablet Take 1 tablet (2.5 mg total) by mouth once a week. 1 tab every Monday 06/07/17 09/05/17 Yes Isaiah Serge, NP  nitroGLYCERIN  (NITROSTAT) 0.4 MG SL tablet Place 1 tablet (0.4 mg total) under the tongue every 5 (five) minutes as needed for chest pain. 11/24/12  Yes Jettie Booze, MD  nortriptyline (PAMELOR) 25 MG capsule Take 50 mg by mouth at bedtime.    Yes [provider]  nortriptyline (PAMELOR) 50 MG capsule Take 50 mg by mouth at bedtime.   Yes [provider]  OXYGEN Inhale 2 L/min into the lungs at bedtime as needed (uses as bedtime everynight and then occasionally iun the daytime as needed for SOB).    Yes [provider]  Probiotic Product (PROBIOTIC PO) Take 1 tablet by mouth daily.   Yes [provider]  senna-docusate (SENOKOT-S) 8.6-50 MG tablet Take 1 tablet by mouth 2 (two) times daily.    Yes [provider]  spironolactone (ALDACTONE) 25 MG tablet Take 0.5 tablets (12.5 mg total) by mouth daily. 01/08/17  Yes Baldwin Jamaica, PA-C  torsemide (DEMADEX) 20 MG tablet Take 4 tablets (80 mg total) every morning and 2 tablets (40 mg total) six hours after morning dosage daily. Patient taking differently: Take 20-80 mg by mouth See admin instructions. Take 4 tablets (80 mg total) every morning and 1 tablet (20 mg total) at 4pm on Tuesday, Thursday, and Saturday ONLY 02/11/17  Yes Jerline Pain, MD  metoprolol succinate (TOPROL-XL) 25 MG 24 hr tablet Take 1 tablet (25 mg total) by mouth daily. 07/18/17   Jerline Pain, MD    Inpatient Medications: Scheduled Meds: . allopurinol  100 mg Oral See admin instructions  . allopurinol  300 mg Oral See admin instructions  . apixaban  5 mg Oral BID  . atorvastatin  10 mg Oral q1800  . CALCIUM/VITAMIN D/MINERALS  1 tablet Oral BID  . cephALEXin  500 mg Oral TID  . colestipol  2 g Oral QHS  . furosemide  80 mg Intravenous Q12H  . gabapentin  800 mg Oral TID  . guaiFENesin  600 mg Oral BID  . isosorbide mononitrate  30 mg Oral Daily  . metoprolol succinate  25 mg Oral Daily  . nortriptyline  50 mg Oral QHS  .  potassium chloride SA  40 mEq Oral BID  . sodium chloride flush  3 mL Intravenous Q12H  . [START ON 07/31/2017] spironolactone  12.5 mg Oral Daily  . Vitamin D3  1,000 Units Oral Daily   Continuous Infusions: . sodium chloride     PRN Meds: sodium chloride, acetaminophen, ondansetron (ZOFRAN) IV, sodium chloride flush  Allergies:    Allergies  Allergen Reactions  . Quinapril Hcl Swelling    Tongue and throat  . Tape Other (See Comments)    Plastic tape causes irritation.   Lavella Lemons [Benzonatate] Swelling and Other (See Comments)    Capsule opened in mouth, not an allergy  . Neosporin [Neomycin-Polymyxin-Gramicidin] Rash    Social History:   Social History   Socioeconomic History  . Marital status: Widowed    Spouse name: Not on file  . Number of children: Not on file  . Years of education: Not on file  . Highest education level: Not on file  Social Needs  . Financial resource strain: Not on file  . Food insecurity - worry: Not on file  . Food insecurity - inability: Not on file  . Transportation needs - medical: Not on file  . Transportation needs - non-medical: Not on file  Occupational History  . Not on file  Tobacco Use  . Smoking status: Former Research scientist (life sciences)  . Smokeless tobacco: Former Network engineer and Sexual Activity  . Alcohol use: No  . Drug use: No  . Sexual activity: Not Currently  Other Topics Concern  . Not on file  Social History Narrative  . Not on file    Family History:   Family History  Problem Relation Age of Onset  . Stroke Mother   . Congestive Heart Failure Mother   . Tuberculosis Father   . Diabetes Brother   . Stroke Brother   . Cancer Brother   . Heart attack Son    Family Status:  Family Status  Relation Name Status  . Mother  Deceased  . Father  Deceased  . Sister  Deceased at age 57  . Brother  Deceased  . Brother  Deceased  . Son  Alive  . Daughter  Alive  . Son  Alive  .  MGM  Deceased  . MGF  Deceased  . PGM   Deceased  . PGF  Deceased  . Brother  (Not Specified)  . Brother  (Not Specified)  . Brother  (Not Specified)  . Son  (Not Specified)    ROS:  Please see the history of present illness.  All other ROS reviewed and negative.     Physical Exam/Data:   Vitals:   07/30/17 1200 07/30/17 1215 07/30/17 1230 07/30/17 1245  BP: (!) 127/97 125/90 128/85 112/79  Pulse: 100 96 96 89  Resp: 16 (!) 21 16 15   SpO2: 100% 100% 98% 97%  Weight:        Intake/Output Summary (Last 24 hours) at 07/30/2017 1333 Last data filed at 07/30/2017 1226 Gross per 24 hour  Intake 100 ml  Output -  Net 100 ml   Filed Weights   07/30/17 0815  Weight: 90.7 kg (200 lb)   Body mass index is 32.28 kg/m.   General: Well developed, well nourished, NAD Skin: Warm, dry, intact  Head: Normocephalic, atraumatic, clear, moist mucus membranes. Neck: Negative for carotid bruits. No JVD Lungs:Clear to ausculation bilaterally. No wheezes, rales, or rhonchi. Breathing is unlabored. Cardiovascular: Irregular with S1 S2. No murmurs, rubs, or gallops Abdomen: Soft, non-tender, non-distended with normoactive bowel sounds.  No obvious abdominal masses. MSK: Strength and tone appear normal for age. 5/5 in all extremities Extremities: 2+BLE edema. No clubbing or cyanosis. DP/PT pulses 1+ bilaterally Neuro: Alert and oriented. No focal deficits. No facial asymmetry. MAE spontaneously. Psych: Responds to questions appropriately with normal affect.     EKG:  The EKG was personally reviewed and demonstrates:  07/30/17 NSR HR 81 RBBB with occasional PVC Telemetry:  Telemetry was personally reviewed and demonstrates: 07/30/17 NSR HR 89  Relevant CV Studies:  ECHO: 05/01/17 Study Conclusions  - Left ventricle: The cavity size was severely dilated. Wall   thickness was normal. The estimated ejection fraction was 20%.   Diffuse hypokinesis. Features are consistent with a pseudonormal   left ventricular filling pattern,  with concomitant abnormal   relaxation and increased filling pressure (grade 2 diastolic   dysfunction). E/medial e&' > 15 suggests LV end diastolic pressure   at least 20 mmHg. - Aortic valve: There was no stenosis. - Mitral valve: Moderately calcified annulus. There was moderate   central regurgitation. - Left atrium: The atrium was moderately to severely dilated. - Right ventricle: The cavity size was normal. Pacer wire or   catheter noted in right ventricle. Systolic function was mildly   reduced. - Tricuspid valve: Peak RV-RA gradient (S): 24 mm Hg. - Pulmonary arteries: PA peak pressure: 27 mm Hg (S). - Inferior vena cava: The vessel was normal in size. The   respirophasic diameter changes were in the normal range (= 50%),   consistent with normal central venous pressure.  Impressions:  - Severely dilated LV with severe diffuse hypokinesis, EF 20%.   Moderate diastolic dysfunction with evidence for elevated LV   filling pressure. Normal RV size with mildly decreased systolic   function. Moderate central mitral regurgitation, likely   functional MR.  CATH: Lexiscan Myoview 01/10/17   The left ventricular ejection fraction is severely decreased (<30%).  Nuclear stress EF: 20%.  There was no ST segment deviation noted during stress.  Defect 1: There is a large defect of moderate severity present in the basal inferior, basal inferolateral, mid inferior, mid inferolateral and apical inferior location.  This is a high risk  study.  Findings consistent with prior myocardial infarction.   High risk study due to the presence of a large inferolateral scar and severely depressed overall left ventricular systolic function. There is no meaningful reversible ischemia identified.  Laboratory Data:  Chemistry Recent Labs  Lab 07/28/17 1102 07/30/17 0817  NA 135 140  K 4.0 2.5*  CL 101 98*  CO2 25 26  GLUCOSE 117* 99  BUN 25* 24*  CREATININE 1.10* 1.16*  CALCIUM 9.0  9.2  GFRNONAA 45* 43*  GFRAA 53* 49*  ANIONGAP 9 16*    Total Protein  Date Value Ref Range Status  07/30/2017 6.5 6.5 - 8.1 g/dL Final   Albumin  Date Value Ref Range Status  07/30/2017 3.8 3.5 - 5.0 g/dL Final   AST  Date Value Ref Range Status  07/30/2017 23 15 - 41 U/L Final   ALT  Date Value Ref Range Status  07/30/2017 15 14 - 54 U/L Final   Alkaline Phosphatase  Date Value Ref Range Status  07/30/2017 65 38 - 126 U/L Final   Total Bilirubin  Date Value Ref Range Status  07/30/2017 1.7 (H) 0.3 - 1.2 mg/dL Final   Hematology Recent Labs  Lab 07/28/17 1102 07/30/17 0838  WBC 10.0 10.6*  RBC 3.82* 4.03  HGB 12.2 12.6  HCT 36.7 39.4  MCV 96.1 97.8  MCH 31.9 31.3  MCHC 33.2 32.0  RDW 14.4 14.6  PLT 196 210   Cardiac EnzymesNo results for input(s): TROPONINI in the last 168 hours.  Recent Labs  Lab 07/28/17 1119 07/30/17 0852  TROPIPOC 0.02 0.05    BNP Recent Labs  Lab 07/30/17 0842  BNP 1,664.3*    DDimer No results for input(s): DDIMER in the last 168 hours. TSH:  Lab Results  Component Value Date   TSH 6.624 (H) 12/25/2015   Lipids: Lab Results  Component Value Date   CHOL 158 07/20/2014   HDL 38.60 (L) 07/20/2014   LDLCALC 73 09/30/2013   LDLDIRECT 80.0 07/20/2014   TRIG 332.0 (H) 07/20/2014   CHOLHDL 4 07/20/2014   HgbA1c: Lab Results  Component Value Date   HGBA1C 5.0 11/26/2015    Radiology/Studies:  Dg Chest 2 View  Result Date: 07/30/2017 CLINICAL DATA:  Cough, weakness, coronary disease post MI, stage III chronic kidney disease, chronic systolic heart failure, hypertension, paroxysmal atrial fibrillation EXAM: CHEST  2 VIEW COMPARISON:  07/28/2017 FINDINGS: LEFT subclavian AICD leads project at RIGHT atrium, RIGHT ventricle and coronary sinus. Enlargement of cardiac silhouette with pulmonary vascular congestion. Mild interstitial infiltrates new since previous exam likely reflecting mild pulmonary edema. No gross pleural  effusion or pneumothorax. Numerous EKG leads project over chest. No acute osseous findings. IMPRESSION: Mild CHF. Electronically Signed   By: Lavonia Dana M.D.   On: 07/30/2017 09:26   Dg Chest 2 View  Result Date: 07/28/2017 CLINICAL DATA:  Fatigue, urinary tract infection EXAM: CHEST  2 VIEW COMPARISON:  07/09/2017 FINDINGS: LEFT-sided pacemaker overlies stable enlarged cardiac silhouette. No effusion, infiltrate or pneumothorax. Degenerate spurring of the spine. Low lung volumes. IMPRESSION: Cardiomegaly and low lung volumes.  No acute findings Electronically Signed   By: Suzy Bouchard M.D.   On: 07/28/2017 12:27    Assessment and Plan:   1. Acute on chronic combined diastolic and systolic heart failure: -Known EF per echo 04/2017 20% with grade II DD dysfunction. Will cancel order for further echo evaluation given the recent study 3 months ago.  -Agree to start Lasix  80 mg IV and monitor response -Will add 10 mg Hydralazine TID  -Daily Weight, 200lb 07/30/17>> weights recorded at last office visit>>210lb (07/18/17), 217lb (06/07/17), 219lb (03/28/17) -Will add hydralazine 10mg  PO TID, pt on nitrates -AICD/BiV in place -Strict I&O, +130ml -No ACEI/ARB secondary to allergy  2.Atrial Fibrillation, transient: -Currently in NSR, rate controlled -Continue BB and Eliquis -CHA2DS2-VASc=4 (CHF, Age x2 , Sex)  3. CKD Stage III: -Stable, Cr, 1.16 today -Baseline appears to be around the 1.0 range -Continue to monitor with diuresis  4. Obstructive Sleep Apnea: -Per IM -CPAP  5. Altered Mental Status: -Per IM -Returing to baseline>>off offending meds -On O2>>mild hypoxia on admission    For questions or updates, please contact [place] Please consult [email or web address] for contact info under Cardiology/STEMI.   SignedKathyrn Drown NP-C [facility] Pager: [phone] 07/30/2017 1:33 PM  As above, patient seen and examined.  Briefly she is an 84 year old female with past  medical history of nonischemic cardiomyopathy, prior biventricular ICD, sleep apnea, chronic stage III kidney disease, paroxysmal atrial fibrillation for evaluation of congestive heart failure.  Patient has been seen recently with altered mental status.  She has been treated for UTIs.  However that she still has had intermittent altered mental status and was brought to the emergency room.  Her saturations were 86% and her chest x-ray showed mild CHF.  BNP greater than 1600.  She is being treated for urinary tract infection and cardiology also asked to evaluate.  She has had mild dyspnea and worsening pedal edema.  She denies chest pain or syncope.  1 acute on chronic systolic congestive heart failure-patient is mildly volume overloaded on examination.  I agree with continuing Lasix 80 mg IV twice daily.  Follow renal function closely.  Patient needs low-sodium diet and fluid restriction.  2 cardiomyopathy-patient has a history of severely reduced LV function.  She has a history of angioedema with ACE inhibitor.  Continue nitrates.  Add hydralazine 10 mg 3 times daily.  Advance as tolerated.  Hold bisoprolol in the setting of acute CHF.  Prior to discharge which changed to carvedilol 6.25 mg twice daily.  Titrate medications as an outpatient.  Patient had an echocardiogram in November and I do not think this needs to be repeated today.  3 paroxysmal atrial fibrillation-patient presently in sinus rhythm.  Continue beta-blocker for rate control if atrial fibrillation recurs.  Continue apixaban.  4  chronic stage III kidney disease-follow renal function with diuresis.  5 altered mental status-management per internal medicine.  Kirk Ruths, MD

## 2017-07-30 NOTE — ED Notes (Signed)
Pt has a lunch tray at bedside

## 2017-07-30 NOTE — ED Notes (Signed)
Ordered pt a lunch tray 

## 2017-07-30 NOTE — H&P (Signed)
History and Physical    Bianca [name] DPO:[id number] DOB: 03-Aug-1932 DOA: 07/30/2017  **Will admit patient based on the expectation that the patient will need hospitalization/ hospital care that crosses at least 2 midnights  PCP: Maurice Small, MD   Attending physician: Lorin Mercy  Patient coming from/Resides with: Independent living facility  Chief Complaint: Altered mentation/confusion  HPI: Bianca [name] is a 84 y.o. female with medical history significant for nonischemic cardiomyopathy with chronic combined systolic and diastolic heart failure, ICD/biventricular pacemaker for CRT, sleep apnea on CPAP, stage III chronic kidney disease, dyslipidemia, obesity, and PAF on Eliquis.  Patient also has difficulty mobilizing secondary to peripheral neuropathy and severe right knee arthritis.  Patient has had recent issues over the past 4-6 weeks with altered mentation.  Presented to the ER on 1/22 and again on 2/10 with a UTI was felt to be the likely culprit.  Each time she was treated with Keflex.  Urine culture from 1/22+ for E. coli resistant only to ampicillin and ampicillin sulbactam.  Unfortunately patient's symptoms of confusion and altered mental status continued waxing and waning pattern.  This prompted family to take patient PCP who apparently gave the patient a dose of IV Rocephin and continued her Keflex which she is on now.  In the ER patient is afebrile and normotensive.  On room air she was noted to have O2 saturations of 86%.  Chest x-ray consistent with mild edema/CHF.  BNP elevated greater than 1600.  Urinalysis unremarkable.  No leukocytosis.  Noted with peripheral edema 2+ bilaterally.  Patient has been empirically treated for symptomatic heart failure with a dose of IV Lasix given by EDP.  ED Course:  Vital Signs: BP 128/85   Pulse 96   Resp 16   Wt 90.7 kg (200 lb)   LMP  (LMP Unknown)   SpO2 98%   BMI 32.28 kg/m  CXR: As above Lab data: Sodium 140, potassium 2.5,  chloride 98, CO2 26, glucose 99, BUN 24, creatinine 1.16, anion gap 16, total bilirubin 1.7, BNP 1664, poc troponin 0 0.05, lactic acid 1.36 and 1.52, white count 10,600 with normal differential, hemoglobin 12.6, platelets 210,000, urinalysis with straw-colored appearance moderate hemoglobin otherwise unremarkable, urine culture obtained in the ER Medications and treatments: KCL bolus 10 mEq x1, potassium 40 mEq p.o. x1, Lasix 60 mg IV x1  Review of Systems:  In addition to the HPI above,  No Fever-chills, myalgias or other constitutional symptoms No Headache, changes with Vision or hearing, new weakness, tingling, numbness in any extremity, dizziness, dysarthria or word finding difficulty, gait disturbance or imbalance, tremors or seizure activity No problems swallowing food or Liquids, indigestion/reflux, choking or coughing while eating, abdominal pain with or after eating No Chest pain, Cough or Shortness of Breath, palpitations, orthopnea or DOE No Abdominal pain, N/V, melena,hematochezia, dark tarry stools, constipation No dysuria, malodorous urine, hematuria or flank pain No new lesions, masses or bruises, No new joint pains, aches, swelling or redness No recent unintentional weight gain or loss No polyuria, polydypsia or polyphagia   Past Medical History:  Diagnosis Date  . Acute blood loss anemia   . Arthritis   . CAD (coronary artery disease)/LBBB 01/01/2017  . Chronic systolic heart failure ([place])   . CKD (chronic kidney disease), stage III ([place]) 12/22/2015  . Depression 11/24/2015  . Hypertension   . Hypokalemia   . Myocardial infarction ([place]) 2008  . Neuromuscular disorder ([place])   . Paroxysmal atrial fibrillation ([facility])   .  Physical deconditioning   . Right flank hematoma   . Sleep apnea     Past Surgical History:  Procedure Laterality Date  . ABDOMINAL HYSTERECTOMY    . ANKLE SURGERY    . CARDIAC CATHETERIZATION    . CHOLECYSTECTOMY    . EP IMPLANTABLE DEVICE N/A  02/29/2016   Procedure: BIV ICD Generator Changeout;  Surgeon: Evans Lance, MD;  Location: [facility];  Service: Cardiovascular;  Laterality: N/A;  . EYE SURGERY    . INSERT / REPLACE / REMOVE PACEMAKER  2008  . KNEE LIGAMENT RECONSTRUCTION  2012  . TONSILLECTOMY      Social History   Socioeconomic History  . Marital status: Widowed    Spouse name: Not on file  . Number of children: Not on file  . Years of education: Not on file  . Highest education level: Not on file  Social Needs  . Financial resource strain: Not on file  . Food insecurity - worry: Not on file  . Food insecurity - inability: Not on file  . Transportation needs - medical: Not on file  . Transportation needs - non-medical: Not on file  Occupational History  . Not on file  Tobacco Use  . Smoking status: Former Research scientist (life sciences)  . Smokeless tobacco: Former Network engineer and Sexual Activity  . Alcohol use: No  . Drug use: No  . Sexual activity: Not Currently  Other Topics Concern  . Not on file  Social History Narrative  . Not on file    Mobility: Walker Work history: Not obtained   Allergies  Allergen Reactions  . Quinapril Hcl Swelling    Tongue and throat  . Tape Other (See Comments)    Plastic tape causes irritation.   Lavella Lemons [Benzonatate] Swelling and Other (See Comments)    Capsule opened in mouth, not an allergy  . Neosporin [Neomycin-Polymyxin-Gramicidin] Rash    Family History  Problem Relation Age of Onset  . Stroke Mother   . Congestive Heart Failure Mother   . Tuberculosis Father   . Diabetes Brother   . Stroke Brother   . Cancer Brother   . Heart attack Son      Prior to Admission medications   Medication Sig Start Date End Date Taking? Authorizing Provider  acetaminophen (TYLENOL) 325 MG tablet Take 325-650 mg by mouth 2 (two) times daily as needed for mild pain or headache.    Yes [provider]  allopurinol (ZYLOPRIM) 100 MG tablet Take 100 mg by mouth  See admin instructions. Takes along with a 300 mg tablet to equal 400 mg    Yes [provider]  allopurinol (ZYLOPRIM) 300 MG tablet Take 300 mg by mouth See admin instructions. Take with the 100mg  tablet to equal 400mg  daily    Yes [provider]  atorvastatin (LIPITOR) 10 MG tablet TAKE 1 TABLET (10 MG TOTAL) BY MOUTH DAILY AT 6 PM. 07/10/17  Yes Jerline Pain, MD  bisoprolol (ZEBETA) 5 MG tablet Take 2.5 mg by mouth at bedtime.   Yes [provider]  Calcium Carbonate-Vit D-Min (CALCIUM/VITAMIN D/MINERALS) 600-200 MG-UNIT TABS Take 1 tablet by mouth 2 (two) times daily.    Yes [provider]  cephALEXin (KEFLEX) 500 MG capsule Take 1 capsule (500 mg total) by mouth 3 (three) times daily. 07/28/17  Yes Jola Schmidt, MD  Cholecalciferol (VITAMIN D3) 1000 UNITS CAPS Take 1,000 Units by mouth daily.    Yes [provider]  colestipol (COLESTID) 1 G tablet Take 2 g by mouth at bedtime.    Yes [provider]  diphenhydramine-acetaminophen (TYLENOL PM) 25-500 MG TABS tablet Take 1 tablet by mouth at bedtime as needed (SLEEP).   Yes [provider]  ELIQUIS 5 MG TABS tablet TAKE 1 TABLET TWICE DAILY Patient taking differently: 5mg  by mouth twice daily 06/07/17  Yes Larey Dresser, MD  gabapentin (NEURONTIN) 800 MG tablet Take 800 mg by mouth 3 (three) times daily.   Yes [provider]  guaiFENesin (MUCINEX) 600 MG 12 hr tablet Take 1 tablet (600 mg total) by mouth 2 (two) times daily. 11/29/15  Yes Theodis Blaze, MD  isosorbide mononitrate (IMDUR) 30 MG 24 hr tablet TAKE 1 TABLET BY MOUTH EVERY DAY Patient taking differently: 30mg  by mouth daily in the morning 01/28/17  Yes Skains, Thana Farr, MD  KLOR-CON M20 20 MEQ tablet TAKE 2 TABLETS (40 MEQ TOTAL) BY MOUTH 2 (TWO) TIMES DAILY. 06/28/17  Yes Isaiah Serge, NP  Melatonin 10 MG CAPS Take 1 capsule by mouth at bedtime as needed (sleep).   Yes [provider]  metolazone  (ZAROXOLYN) 2.5 MG tablet Take 1 tablet (2.5 mg total) by mouth once a week. 1 tab every Monday 06/07/17 09/05/17 Yes Isaiah Serge, NP  nitroGLYCERIN (NITROSTAT) 0.4 MG SL tablet Place 1 tablet (0.4 mg total) under the tongue every 5 (five) minutes as needed for chest pain. 11/24/12  Yes Jettie Booze, MD  nortriptyline (PAMELOR) 25 MG capsule Take 50 mg by mouth at bedtime.    Yes [provider]  nortriptyline (PAMELOR) 50 MG capsule Take 50 mg by mouth at bedtime.   Yes [provider]  OXYGEN Inhale 2 L/min into the lungs at bedtime as needed (uses as bedtime everynight and then occasionally iun the daytime as needed for SOB).    Yes [provider]  Probiotic Product (PROBIOTIC PO) Take 1 tablet by mouth daily.   Yes [provider]  senna-docusate (SENOKOT-S) 8.6-50 MG tablet Take 1 tablet by mouth 2 (two) times daily.    Yes [provider]  spironolactone (ALDACTONE) 25 MG tablet Take 0.5 tablets (12.5 mg total) by mouth daily. 01/08/17  Yes Baldwin Jamaica, PA-C  torsemide (DEMADEX) 20 MG tablet Take 4 tablets (80 mg total) every morning and 2 tablets (40 mg total) six hours after morning dosage daily. Patient taking differently: Take 20-80 mg by mouth See admin instructions. Take 4 tablets (80 mg total) every morning and 1 tablet (20 mg total) at 4pm on Tuesday, Thursday, and Saturday ONLY 02/11/17  Yes Jerline Pain, MD  metoprolol succinate (TOPROL-XL) 25 MG 24 hr tablet Take 1 tablet (25 mg total) by mouth daily. 07/18/17   Jerline Pain, MD    Physical Exam: Vitals:   07/30/17 1145 07/30/17 1200 07/30/17 1215 07/30/17 1230  BP: 123/80 (!) 127/97 125/90 128/85  Pulse: 81 100 96 96  Resp: 15 16 (!) 21 16  SpO2: 98% 100% 100% 98%  Weight:          Constitutional: NAD, calm, comfortable Eyes: PERRL, lids and conjunctivae normal ENMT: Mucous membranes are moist. Posterior pharynx clear of any exudate or lesions.  Neck: normal,  supple, no masses, no thyromegaly Respiratory: clear to auscultation bilaterally, no wheezing, no crackles.  Diminished at bases normal respiratory effort. No accessory muscle use.  2 L oxygen Cardiovascular: Slightly irregular rhythm, no murmurs / rubs / gallops.  2+ bilateral lower extremity edema. 2+ pedal pulses. No carotid bruits.  Abdomen: no tenderness, no masses palpated. No hepatosplenomegaly. Bowel sounds positive.  Musculoskeletal: no clubbing / cyanosis. No joint deformity upper and lower extremities. Good ROM, no contractures. Normal muscle tone.  Skin: no lesions, ulcers.  Marked erythematous area from lower abdomen and appendix extending down thighs and throughout perineum consistent with severe Candida dermatitis-??  Recurrent urinary incontinence Neurologic: CN 2-12 grossly intact. Sensation intact, DTR normal. Strength 5/5 x all 4 extremities.  Psychiatric: Alert and oriented x 3. Normal mood.    Labs on Admission: I have personally reviewed following labs and imaging studies  CBC: Recent Labs  Lab 07/28/17 1102 07/30/17 0838  WBC 10.0 10.6*  NEUTROABS 6.3 7.6  HGB 12.2 12.6  HCT 36.7 39.4  MCV 96.1 97.8  PLT 196 376   Basic Metabolic Panel: Recent Labs  Lab 07/28/17 1102 07/30/17 0817  NA 135 140  K 4.0 2.5*  CL 101 98*  CO2 25 26  GLUCOSE 117* 99  BUN 25* 24*  CREATININE 1.10* 1.16*  CALCIUM 9.0 9.2   GFR: Estimated Creatinine Clearance: 42.4 mL/min (A) (by C-G formula based on SCr of 1.16 mg/dL (H)). Liver Function Tests: Recent Labs  Lab 07/28/17 1102 07/30/17 0817  AST 21 23  ALT 14 15  ALKPHOS 65 65  BILITOT 1.2 1.7*  PROT 7.2 6.5  ALBUMIN 4.1 3.8   No results for input(s): LIPASE, AMYLASE in the last 168 hours. Recent Labs  Lab 07/28/17 1403  AMMONIA 16   Coagulation Profile: Recent Labs  Lab 07/28/17 1102  INR 1.72   Cardiac Enzymes: No results for input(s): CKTOTAL, CKMB, CKMBINDEX, TROPONINI in the last 168 hours. BNP (last  3 results) No results for input(s): PROBNP in the last 8760 hours. HbA1C: No results for input(s): HGBA1C in the last 72 hours. CBG: Recent Labs  Lab 07/30/17 0927  GLUCAP 91   Lipid Profile: No results for input(s): CHOL, HDL, LDLCALC, TRIG, CHOLHDL, LDLDIRECT in the last 72 hours. Thyroid Function Tests: No results for input(s): TSH, T4TOTAL, FREET4, T3FREE, THYROIDAB in the last 72 hours. Anemia Panel: No results for input(s): VITAMINB12, FOLATE, FERRITIN, TIBC, IRON, RETICCTPCT in the last 72 hours. Urine analysis:    Component Value Date/Time   COLORURINE STRAW (A) 07/30/2017 0955   APPEARANCEUR CLEAR 07/30/2017 0955   LABSPEC 1.006 07/30/2017 0955   PHURINE 6.0 07/30/2017 0955   GLUCOSEU NEGATIVE 07/30/2017 0955   HGBUR MODERATE (A) 07/30/2017 0955   BILIRUBINUR NEGATIVE 07/30/2017 0955   KETONESUR NEGATIVE 07/30/2017 0955   PROTEINUR NEGATIVE 07/30/2017 0955   UROBILINOGEN 0.2 11/21/2012 1750   NITRITE NEGATIVE 07/30/2017 0955   LEUKOCYTESUR NEGATIVE 07/30/2017 0955   Sepsis Labs: @LABRCNTIP (procalcitonin:4,lacticidven:4) )No results found for this or any previous visit (from the past 240 hour(s)).   Radiological Exams on Admission: Dg Chest 2 View  Result Date: 07/30/2017 CLINICAL DATA:  Cough, weakness, coronary disease post MI, stage III chronic kidney disease, chronic systolic heart failure, hypertension, paroxysmal atrial fibrillation EXAM: CHEST  2 VIEW COMPARISON:  07/28/2017 FINDINGS: LEFT subclavian AICD leads project at RIGHT atrium, RIGHT ventricle and coronary sinus. Enlargement of cardiac silhouette with pulmonary vascular congestion. Mild interstitial infiltrates new since previous exam likely reflecting mild pulmonary edema. No gross pleural effusion or pneumothorax. Numerous EKG leads project over chest. No acute osseous findings. IMPRESSION: Mild CHF. Electronically Signed   By: Lavonia Dana M.D.   On: 07/30/2017 09:26  EKG: (Independently reviewed)  sinus rhythm with variable P wave morphology and context of underlying right bundle branch block with occasional PVCs, no significant change from previous  Assessment/Plan Principal Problem:   Acute on chronic combined systolic and diastolic heart failure  -Presents with progressive fatigue and altered mental status with elevated BNP, peripheral edema, and abnormal chest x-ray concerning for mild exacerbation of CHF -IV Lasix x2 more doses then likely can resume home regimen of Demadex, Aldactone and weekly metolazone -Cardiology consulted -Echo -Previous EF November 2018 20% with diffuse hypokinesis and grade 2 diastolic dysfunction with moderate MR -Continue beta-blocker (son reports pharmacy with difficulty obtaining bisoprolol so changed to metoprolol) -No ARB/ACE inhibitor secondary to CKD and allergy -Continue afterload reduction with oral nitrate -Daily weight and strict I's/O; weight appears stable around 200 pounds with a reduction of over 50 lbs since August 2018 -Volume status monitored in the outpatient setting utilizing Optivol -Follow electrolytes -in situ AICD/BiV pacer  Active Problems:   Acute respiratory failure with hypoxia  -? Due to CHF vs deconditioning in context chronic HF/dysmobility 2/2 knee arthritis -O2-may need at dc -tx underlying causes    Recurrent UTI -as above; does not appear to have overt UTI at present -cont preadmit Keflex -FU urine cx obtained in ER    Altered mental status -Seems to be returning to baseline-?? Hypoxia contributing -On multiple offending meds prior to admission: Tylenol PM, Neurontin, Pamelor and melatonin -May need adjustments in these medications prior to discharge-defer to supervising physician    Yeast dermatitis -Nystatin powder 3 times daily to affected areas; per supervising MD: give 1x dose Diflucan IV -WOC RN consult -air mattress -urinary incontinence likely trigger    Hypokalemia -Continue preadmission Kdur 40  meq BID -follow labs    Atrial fibrillation, transient -Rate controlled on beta-blocker -Eliquis -CHA2DS2-VASc=4    CKD (chronic kidney disease), symptom management only, stage 3 (moderate)  -At baseline    Obstructive sleep apnea on CPAP -Reports difficulty with current mask-trial nasal mask -Bleed in O2    Arthritis of right knee/neuropathy -PT/OT eval -Continue meds as above    Class 1 obesity with body mass index (BMI) of 32.0 to 32.9 in adult    Dyslipidemia -Continue statin and colestipol   **Additional lab, imaging and or diagnostic evaluation at discretion of supervising physician  DVT prophylaxis: Eliquis Code Status: Full Family Communication: Son Disposition Plan: Independent living Consults called: Cardiology/discussed with card master    Samella Parr ANP-BC [facility] Pager [phone]   If 7PM-7AM, please contact night-coverage [email or web address] Password [name]  07/30/2017, 12:51 PM

## 2017-07-30 NOTE — Consult Note (Signed)
[facility] Nurse wound consult note Reason for Consult: redness thighs, groin, perineum  Wound type: ITD (intertriginous skin damage related to moisture) using adult undergarments in the facility she resides. Son reports worse in groin and under the pannus.  Consider that garment may be rubbing this area as well. Her perineum is not as bad. The bilateral groin are severe with satellite lesions.  I have discontinued the topical nystatin in favor of an antimicrobial wicking fabric that will keep the skin folds dry and the antimicrobial effects for the fungal overgrowth.  Patient could benefit as well from one time treatment with antifungal PO. I will notify the admitting MD for this.  Pressure Injury POA:/NA  Wound bed: non blanchable intense redness under the pannus and some partial thickness skin loss (linear) in the groin. Some mild weeping of the skin in the groin. With satellite lesions  Drainage (amount, consistency, odor) none Periwound: intact  Dressing procedure/placement/frequency: Antimicrobial wicking fabric under the pannus and in the bilateral groin DC use of nystatin Consider use of PO Diflucan for candida overgrowth.  Discussed POC with patient and bedside nurse.  Re consult if needed, will not follow at this time. Thanks  Iniko Robles [employer], RN,CWOCN, CNS, [place] [phone])

## 2017-07-31 DIAGNOSIS — N390 Urinary tract infection, site not specified: Secondary | ICD-10-CM

## 2017-07-31 DIAGNOSIS — I4891 Unspecified atrial fibrillation: Secondary | ICD-10-CM

## 2017-07-31 DIAGNOSIS — I5043 Acute on chronic combined systolic (congestive) and diastolic (congestive) heart failure: Secondary | ICD-10-CM

## 2017-07-31 DIAGNOSIS — E876 Hypokalemia: Secondary | ICD-10-CM

## 2017-07-31 DIAGNOSIS — E785 Hyperlipidemia, unspecified: Secondary | ICD-10-CM

## 2017-07-31 DIAGNOSIS — B372 Candidiasis of skin and nail: Secondary | ICD-10-CM

## 2017-07-31 DIAGNOSIS — R4182 Altered mental status, unspecified: Secondary | ICD-10-CM

## 2017-07-31 DIAGNOSIS — R410 Disorientation, unspecified: Secondary | ICD-10-CM

## 2017-07-31 DIAGNOSIS — J9601 Acute respiratory failure with hypoxia: Secondary | ICD-10-CM

## 2017-07-31 DIAGNOSIS — G4733 Obstructive sleep apnea (adult) (pediatric): Secondary | ICD-10-CM

## 2017-07-31 DIAGNOSIS — Z9989 Dependence on other enabling machines and devices: Secondary | ICD-10-CM

## 2017-07-31 DIAGNOSIS — M1711 Unilateral primary osteoarthritis, right knee: Secondary | ICD-10-CM

## 2017-07-31 LAB — BASIC METABOLIC PANEL
ANION GAP: 17 — AB (ref 5–15)
BUN: 23 mg/dL — ABNORMAL HIGH (ref 6–20)
CO2: 26 mmol/L (ref 22–32)
Calcium: 9.4 mg/dL (ref 8.9–10.3)
Chloride: 97 mmol/L — ABNORMAL LOW (ref 101–111)
Creatinine, Ser: 1.03 mg/dL — ABNORMAL HIGH (ref 0.44–1.00)
GFR calc Af Amer: 57 mL/min — ABNORMAL LOW (ref >60)
GFR, EST NON AFRICAN AMERICAN: 49 mL/min — AB (ref >60)
Glucose, Bld: 98 mg/dL (ref 65–99)
POTASSIUM: 3.1 mmol/L — AB (ref 3.5–5.1)
SODIUM: 140 mmol/L (ref 135–145)

## 2017-07-31 LAB — URINE CULTURE: CULTURE: NO GROWTH

## 2017-07-31 LAB — CBC WITH DIFFERENTIAL/PLATELET
BASOS ABS: 0 10*3/uL (ref 0.0–0.1)
Basophils Relative: 0 %
Eosinophils Absolute: 0.3 10*3/uL (ref 0.0–0.7)
Eosinophils Relative: 4 %
HEMATOCRIT: 41.2 % (ref 36.0–46.0)
Hemoglobin: 13.3 g/dL (ref 12.0–15.0)
LYMPHS PCT: 21 %
Lymphs Abs: 1.9 10*3/uL (ref 0.7–4.0)
MCH: 31.7 pg (ref 26.0–34.0)
MCHC: 32.3 g/dL (ref 30.0–36.0)
MCV: 98.3 fL (ref 78.0–100.0)
Monocytes Absolute: 0.5 10*3/uL (ref 0.1–1.0)
Monocytes Relative: 6 %
NEUTROS ABS: 6.2 10*3/uL (ref 1.7–7.7)
Neutrophils Relative %: 69 %
PLATELETS: 226 10*3/uL (ref 150–400)
RBC: 4.19 MIL/uL (ref 3.87–5.11)
RDW: 14.9 % (ref 11.5–15.5)
WBC: 8.9 10*3/uL (ref 4.0–10.5)

## 2017-07-31 MED ORDER — CARVEDILOL 3.125 MG PO TABS: 3.1250 mg | ORAL_TABLET | Freq: Two times a day (BID) | ORAL | Status: DC

## 2017-07-31 MED ORDER — METOPROLOL SUCCINATE ER 25 MG PO TB24
25.0000 mg | ORAL_TABLET | Freq: Every day | ORAL | Status: DC
  Administered 2017-07-31: 25 mg via ORAL
  Filled 2017-07-31: qty 1

## 2017-07-31 MED ORDER — METOPROLOL SUCCINATE ER 25 MG PO TB24
25.0000 mg | ORAL_TABLET | Freq: Every day | ORAL | Status: DC
  Administered 2017-08-01 – 2017-08-02 (×2): 25 mg via ORAL
  Filled 2017-07-31 (×2): qty 1

## 2017-07-31 MED ORDER — GABAPENTIN 400 MG PO CAPS
400.0000 mg | ORAL_CAPSULE | Freq: Three times a day (TID) | ORAL | Status: DC
  Administered 2017-07-31 – 2017-08-02 (×6): 400 mg via ORAL
  Filled 2017-07-31 (×6): qty 1

## 2017-07-31 NOTE — Progress Notes (Signed)
PT has home CPAP at bedside. CPAP clean with no frayed wires.

## 2017-07-31 NOTE — Progress Notes (Signed)
Patient was placed on Coreg- paged Cardiology.  Per family she had to discontinue coreg due to extreme fatigue/lethargy.   Dr. Stanford Breed called back- verbal order taken to discontinue coreg and place back on metoprolol 25 mg.

## 2017-07-31 NOTE — Evaluation (Signed)
Occupational Therapy Evaluation Patient Details Name: Bianca [name] MRN: [id number] DOB: May 17, 1933 Today's Date: 07/31/2017    History of Present Illness Pt is an 84 y.o. female admitted from [place] on 07/30/17 with AMS, weakness, and worsening DOE; worked up for CHF exacerbation. Pt has also had multiple UTIs recently with progressive weakness over several weeks. PMH includes OSA on CPAP, a-fib on Eliquis, CAD, CHF, HTN, CKD III, CAD, pacemaker, peripheral neuropathy.    Clinical Impression   Pt with decline in function and safety with ADLs and ADL mobility with decreased strength, balance and endurance. Pt with decreased safety awareness and lives at an ALF and was able to ambulate with 4W RW to dining room 3x/day without rest breaks until recently, now requiring rest breaks pt and son report. Pt reports multiple falls within the past month. Recommend short term SNF rehab as safest option before return to ALF. OT will follow acutely to address impairments to maximize level of function and safety    Follow Up Recommendations  SNF    Equipment Recommendations  None recommended by OT;Other (comment)(TBD at next venue of care)    Recommendations for Other Services       Precautions / Restrictions Precautions Precautions: Fall Precaution Comments: Bilat foot peripheral neuropathy Restrictions Weight Bearing Restrictions: No      Mobility Bed Mobility Overal bed mobility: Needs Assistance Bed Mobility: Sit to Supine       Sit to supine: Mod assist   General bed mobility comments: ModA to assist BLEs back into bed  Transfers Overall transfer level: Needs assistance Equipment used: Rolling walker (2 wheeled) Transfers: Sit to/from Stand Sit to Stand: Min assist;Min guard         General transfer comment: Pt stood from recliner, BSC, and toilet with RW and minA for trunk elevation, progressing to min guard for balance; cues for proper hand placement as pt  wanting to pull up on RW. Pt with good eccentric control lowering to BSC, but poor eccentric control to lower height of toilet    Balance Overall balance assessment: Needs assistance Sitting-balance support: No upper extremity supported;Feet supported Sitting balance-Leahy Scale: Fair     Standing balance support: Single extremity supported;During functional activity;Bilateral upper extremity supported Standing balance-Leahy Scale: Poor Standing balance comment: Reliant on UE support                           ADL either performed or assessed with clinical judgement   ADL Overall ADL's : Needs assistance/impaired Eating/Feeding: Independent;Sitting   Grooming: Wash/dry hands;Wash/dry face;Standing;Min guard   Upper Body Bathing: Set up;Supervision/ safety;Sitting   Lower Body Bathing: Moderate assistance   Upper Body Dressing : Set up;Sitting   Lower Body Dressing: Moderate assistance   Toilet Transfer: Minimal assistance;Cueing for safety;Cueing for sequencing;Ambulation;RW;Comfort height toilet;Regular Toilet;Grab bars   Toileting- Clothing Manipulation and Hygiene: Minimal assistance;Sit to/from stand       Functional mobility during ADLs: Min guard;Rolling walker;Minimal assistance       Vision Baseline Vision/History: Wears glasses Wears Glasses: Reading only Patient Visual Report: No change from baseline       Perception     Praxis      Pertinent Vitals/Pain Pain Assessment: Faces Faces Pain Scale: Hurts a little bit Pain Location: BLEs (R>L) Pain Descriptors / Indicators: Constant;Discomfort Pain Intervention(s): Monitored during session     Hand Dominance Right   Extremity/Trunk Assessment Upper Extremity Assessment Upper Extremity Assessment:  Generalized weakness   Lower Extremity Assessment Lower Extremity Assessment: Defer to PT evaluation RLE Sensation: history of peripheral neuropathy LLE Sensation: history of peripheral  neuropathy       Communication Communication Communication: HOH   Cognition Arousal/Alertness: Awake/alert Behavior During Therapy: WFL for tasks assessed/performed Overall Cognitive Status: Impaired/Different from baseline Area of Impairment: Memory;Attention;Following commands;Safety/judgement;Problem solving                   Current Attention Level: Selective Memory: Decreased short-term memory Following Commands: Follows multi-step commands with increased time;Follows multi-step commands inconsistently Safety/Judgement: Decreased awareness of safety   Problem Solving: Difficulty sequencing;Requires verbal cues;Requires tactile cues General Comments: Son reports cognition returning more to baseline today except he has noticed deficits with memory (i.e. calling son by husband's name)   General Comments  Son present during session    Exercises     Shoulder Instructions      Home Living Family/patient expects to be discharged to:: Assisted living                             Home Equipment: Walker - 4 wheels;Walker - 2 wheels;Cane - single point;Shower seat   Additional Comments: Pt currently living at [facility]      Prior Functioning/Environment Level of Independence: Needs assistance  Gait / Transfers Assistance Needed: Pt able to ambulate to/from dining room with rollator; recently, has required rest breaks along the way secondary to BLE pain ADL's / Homemaking Assistance Needed: Pt reports indep with ADLS; unsure if this reliable information            OT Problem List: Decreased strength;Decreased activity tolerance;Decreased knowledge of use of DME or AE;Decreased safety awareness;Impaired balance (sitting and/or standing);Pain;Decreased knowledge of precautions      OT Treatment/Interventions: Self-care/ADL training;DME and/or AE instruction;Therapeutic activities;Therapeutic exercise;Patient/family education;Balance training     OT Goals(Current goals can be found in the care plan section) Acute Rehab OT Goals Patient Stated Goal: Get stronger OT Goal Formulation: With patient/family Time For Goal Achievement: 08/14/17 Potential to Achieve Goals: Good ADL Goals Pt Will Perform Grooming: with supervision;with set-up;standing Pt Will Perform Upper Body Bathing: with modified independence;sitting Pt Will Perform Lower Body Bathing: with min assist;sit to/from stand Pt Will Perform Upper Body Dressing: with modified independence Pt Will Transfer to Toilet: with min guard assist;ambulating Pt Will Perform Toileting - Clothing Manipulation and hygiene: with min guard assist;sit to/from stand  OT Frequency: Min 2X/week   Barriers to D/C: Decreased caregiver support  pt resides at an ALF, no additional assist available       Co-evaluation              AM-PAC PT "6 Clicks" Daily Activity     Outcome Measure Help from another person eating meals?: None Help from another person taking care of personal grooming?: A Little Help from another person toileting, which includes using toliet, bedpan, or urinal?: A Little Help from another person bathing (including washing, rinsing, drying)?: A Lot Help from another person to put on and taking off regular upper body clothing?: A Little Help from another person to put on and taking off regular lower body clothing?: A Lot 6 Click Score: 17   End of Session Equipment Utilized During Treatment: Gait belt;Rolling walker;Other (comment)(3 in 1) Nurse Communication: Mobility status  Activity Tolerance: Patient tolerated treatment well Patient left: in bed;with call bell/phone within reach;with family/visitor present  OT Visit  Diagnosis: Unsteadiness on feet (R26.81);Other abnormalities of gait and mobility (R26.89);Muscle weakness (generalized) (M62.81);Pain;Repeated falls (R29.6);History of falling (Z91.81) Pain - Right/Left: Right Pain - part of body: Knee;Leg;Ankle and  joints of foot                Time: 1323-1405 OT Time Calculation (min): 42 min Charges:  OT General Charges $OT Visit: 1 Visit OT Evaluation $OT Eval Moderate Complexity: 1 Mod G-Codes: OT G-codes **NOT FOR INPATIENT CLASS** Functional Assessment Tool Used: AM-PAC 6 Clicks Daily Activity     Britt Bottom 07/31/2017, 2:39 PM

## 2017-07-31 NOTE — Progress Notes (Signed)
PROGRESS NOTE    Bianca [name]  WUJ:[id number] DOB: 27-Mar-1933 DOA: 07/30/2017 PCP: Maurice Small, MD   Brief Narrative: Bianca [name] is a 84 y.o. female with a Past Medical History of OSA on CPAP; afib on Eliquis; CAD; combined chronic heart failure (EF 20%, grade 2 DD in 11/18); HTN; stage 3 CKD; Dyslipidema; [place] CAD who presents with AMS and weakness.  Patient/son describe progressive weakness for several weeks.  Over that time, she has been treated for UTI 3 times including most recently 2 days ago with Keflex.  She has had increased LE edema and worsening DOE with orthopnea. Currently being diuresed with IV Lasix and Cardiology following.   Assessment & Plan:   Principal Problem:   Acute on chronic combined systolic and diastolic heart failure (HCC) Active Problems:   Acute respiratory failure with hypoxia (HCC)   Recurrent UTI   Altered mental status   Obstructive sleep apnea on CPAP   Yeast dermatitis   Hypokalemia   Atrial fibrillation, transient (HCC)   Arthritis of right knee/neuropathy   Class 1 obesity with body mass index (BMI) of 32.0 to 32.9 in adult   Dyslipidemia   CKD (chronic kidney disease), symptom management only, stage 3 (moderate) (HCC)  Acute on chronic Combined Systolic and Grade 2 Diastolic Heart Failure  -Presented with progressive fatigue and altered mental status with elevated BNP, peripheral edema, and abnormal chest x-ray concerning for mild exacerbation of CHF -IV Lasix x2 more doses then likely can resume home regimen of Demadex, Aldactone and weekly metolazone -Cardiology consulted and appreciate recc's; Recommending continuing current present dose of IV Lasix 80 mg BID and following Renal Fxn in AM  -Previous EF November 2018 20% with diffuse hypokinesis and grade 2 diastolic dysfunction with moderate MR; Per Cardiology will not repeat ECHO -Continue beta-blocker per Cardiology; Metoprolol D/C'd and Carvedilol 3.125 mg po BID added but  Cardiology recommended continuing Metoprolol given Fatigue with BB -No ARB/ACE inhibitor secondary to CKD and allergy -C/w Spironolactone 12.5 mg po Daily  -C/w Hydralazine 10 mg TID and Isosorbide Mononitrate 30 mg po Daily  -Daily Weights and Strict I's/O;  -Patient is -1.320 Liters; Weight is now 218 -Volume status monitored in the outpatient setting utilizing Optivol -Follow electrolytes -in situ AICD/BiV pacer  Acute Respiratory Failure with Hypoxia  -? Due to CHF vs deconditioning in context chronic HF/dysmobility 2/2 knee arthritis -C/w Supplemental O2 via [place] -C/w IV Diuresis as Above -Repeat CXR in AM   Recurrent UTI -As above; does not appear to have overt UTI at present -Continue preadmit Cephalexin 500 mg po TID -FU urine cx obtained in ER shows No Growth  Altered Mental Status -Unclear Etiology but on several medications -On multiple offending meds prior to admission: Tylenol PM, Neurontin, Pamelor and melatonin -Gabapentin dose decreased to 400 mg TID, C/w Nortriptyline 50 mg po qHS  Yeast Dermatitis -Nystatin powder 3 times daily to affected areas; per supervising MD: give 1x dose Diflucan IV -WOC RN consult -air mattress -urinary incontinence likely trigger -Continue to Monitor   Hypokalemia -Patient's K+ went from 2.5 -> 3.1 -C/w po KCl 40 mEQ BID and Spironolactone 12.5 mg po Daily  -Continue to Monitor and Replete as Necessary -Repeat CMP in AM   Paroxysmal Atrial Fibrillation  -Rate controlled on beta-blocker -C/w Anticoagulation with Apixaban  -CHA2DS2-VASc=4 -C/w Telemetry   CKD Stage 3 -BUN/Cr went from 24/1.16 -> 23/1.03 -Avoid Nephrotoxic Medications if possible -Continue to Monitor Renal Fxn and repeat CMP  in AM  Obstructive sleep apnea on CPAP -Reports difficulty with current mask-trial nasal mask -Bleed in O2  Arthritis of right knee/neuropathy -PT/OT eval recommending SNF -Decreased Gabapentin 800 mg TID to 400 mg TID given  recent confusion   Class 1 obesity with body mass index (BMI) of 32.0 to 32.9 in adult -Weight loss Counseling given   Hyperlipidemia/Dyslipidema -C/w Atorvastatin 10 mg po qHS and Colestipol 2 grams po qHS  DVT prophylaxis: Anticoagulated with Apixaban 5 mg po BID Code Status: FULL CODE Family Communication: Discussed with Niece at bedside Disposition Plan: Anticipate D/C to SNF when medically stable  Consultants:   Cardiology Dr. Kirk Ruths   Procedures:  None   Antimicrobials:  Anti-infectives (From admission, onward)   Start     Dose/Rate Route Frequency Ordered Stop   07/30/17 1600  cephALEXin (KEFLEX) capsule 500 mg     500 mg Oral 3 times daily 07/30/17 1240     07/30/17 1415  fluconazole (DIFLUCAN) IVPB 200 mg     200 mg 100 mL/hr over 60 Minutes Intravenous  Once 07/30/17 1400 07/30/17 2115     Subjective: Seen and examined and SOB was better. Still wearing Supplemental O2 via [place]. Niece notes still confused. No nausea or vomiting.   Objective: Vitals:   07/31/17 0311 07/31/17 0512 07/31/17 0922 07/31/17 1205  BP:  105/69 120/70 (!) 106/49  Pulse: 85 78 91 89  Resp:    18  Temp:  98.2 F (36.8 C)  (!) 97.4 F (36.3 C)  TempSrc:  Oral  Oral  SpO2:  97% 94% 96%  Weight:  98.9 kg (218 lb 1.6 oz)    Height:        Intake/Output Summary (Last 24 hours) at 07/31/2017 1441 Last data filed at 07/31/2017 1429 Gross per 24 hour  Intake 1080 ml  Output 2500 ml  Net -1420 ml   Filed Weights   07/30/17 0815 07/30/17 1721 07/31/17 0512  Weight: 90.7 kg (200 lb) 100.5 kg (221 lb 9 oz) 98.9 kg (218 lb 1.6 oz)   Examination: Physical Exam:  Constitutional: WN/WD obese Caucasian female in NAD and appears calm and comfortable Eyes: Lids and conjunctivae normal, sclerae anicteric  ENMT: External Ears, Nose appear normal. Neck: Appears normal, supple, no cervical masses, normal ROM, no appreciable thyromegaly, no JVD Respiratory: Diminished to auscultation  bilaterally, no wheezing, rales, rhonchi or crackles. Normal respiratory effort and patient is not tachypenic. No accessory muscle use but wearing supplemental O2 via  Cardiovascular: RRR, no murmurs / rubs / gallops. Mild extremity edema.  Abdomen: Soft, non-tender, non-distended. No masses palpated. No appreciable hepatosplenomegaly. Bowel sounds positive x4.  GU: Deferred. Musculoskeletal: No clubbing / cyanosis of digits/nails.  Skin: No rashes, lesions, ulcers on a limited skin evaluation. No induration; Warm and dry.  Neurologic: CN 2-12 grossly intact with no focal deficits. Romberg sign cerebellar reflexes not assessed.  Psychiatric: Impaired judgment and insight. Alert and awake. Normal mood and appropriate affect.   Data Reviewed: I have personally reviewed following labs and imaging studies  CBC: Recent Labs  Lab 07/28/17 1102 07/30/17 0838 07/31/17 1047  WBC 10.0 10.6* 8.9  NEUTROABS 6.3 7.6 6.2  HGB 12.2 12.6 13.3  HCT 36.7 39.4 41.2  MCV 96.1 97.8 98.3  PLT 196 210 176   Basic Metabolic Panel: Recent Labs  Lab 07/28/17 1102 07/30/17 0817 07/31/17 0517  NA 135 140 140  K 4.0 2.5* 3.1*  CL 101 98* 97*  CO2 25  26 26  GLUCOSE 117* 99 98  BUN 25* 24* 23*  CREATININE 1.10* 1.16* 1.03*  CALCIUM 9.0 9.2 9.4  MG  --  1.9  --    GFR: Estimated Creatinine Clearance: 49.9 mL/min (A) (by C-G formula based on SCr of 1.03 mg/dL (H)). Liver Function Tests: Recent Labs  Lab 07/28/17 1102 07/30/17 0817  AST 21 23  ALT 14 15  ALKPHOS 65 65  BILITOT 1.2 1.7*  PROT 7.2 6.5  ALBUMIN 4.1 3.8   No results for input(s): LIPASE, AMYLASE in the last 168 hours. Recent Labs  Lab 07/28/17 1403  AMMONIA 16   Coagulation Profile: Recent Labs  Lab 07/28/17 1102  INR 1.72   Cardiac Enzymes: No results for input(s): CKTOTAL, CKMB, CKMBINDEX, TROPONINI in the last 168 hours. BNP (last 3 results) No results for input(s): PROBNP in the last 8760 hours. HbA1C: No  results for input(s): HGBA1C in the last 72 hours. CBG: Recent Labs  Lab 07/30/17 0927  GLUCAP 91   Lipid Profile: No results for input(s): CHOL, HDL, LDLCALC, TRIG, CHOLHDL, LDLDIRECT in the last 72 hours. Thyroid Function Tests: No results for input(s): TSH, T4TOTAL, FREET4, T3FREE, THYROIDAB in the last 72 hours. Anemia Panel: No results for input(s): VITAMINB12, FOLATE, FERRITIN, TIBC, IRON, RETICCTPCT in the last 72 hours. Sepsis Labs: Recent Labs  Lab 07/30/17 0855 07/30/17 1244  LATICACIDVEN 1.36 1.52    Recent Results (from the past 240 hour(s))  Urine culture     Status: None   Collection Time: 07/30/17  9:55 AM  Result Value Ref Range Status   Specimen Description URINE, CATHETERIZED  Final   Special Requests NONE  Final   Culture   Final    NO GROWTH Performed at [facility], [address], [place], [place] [address]    Report Status 07/31/2017 FINAL  Final    Radiology Studies: Dg Chest 2 View  Result Date: 07/30/2017 CLINICAL DATA:  Cough, weakness, coronary disease post MI, stage III chronic kidney disease, chronic systolic heart failure, hypertension, paroxysmal atrial fibrillation EXAM: CHEST  2 VIEW COMPARISON:  07/28/2017 FINDINGS: LEFT subclavian AICD leads project at RIGHT atrium, RIGHT ventricle and coronary sinus. Enlargement of cardiac silhouette with pulmonary vascular congestion. Mild interstitial infiltrates new since previous exam likely reflecting mild pulmonary edema. No gross pleural effusion or pneumothorax. Numerous EKG leads project over chest. No acute osseous findings. IMPRESSION: Mild CHF. Electronically Signed   By: Lavonia Dana M.D.   On: 07/30/2017 09:26   Scheduled Meds: . allopurinol  400 mg Oral Daily  . apixaban  5 mg Oral BID  . atorvastatin  10 mg Oral q1800  . calcium-vitamin D  1 tablet Oral BID WC  . cephALEXin  500 mg Oral TID  . chlorhexidine  15 mL Mouth Rinse BID  . cholecalciferol  1,000 Units Oral Daily  .  colestipol  2 g Oral QHS  . furosemide  80 mg Intravenous BID  . gabapentin  800 mg Oral TID  . guaiFENesin  600 mg Oral BID  . hydrALAZINE  10 mg Oral TID  . isosorbide mononitrate  30 mg Oral Daily  . mouth rinse  15 mL Mouth Rinse q12n4p  . metoprolol succinate  25 mg Oral Daily  . nortriptyline  50 mg Oral QHS  . potassium chloride SA  40 mEq Oral BID  . sodium chloride flush  3 mL Intravenous Q12H  . spironolactone  12.5 mg Oral Daily  Continuous Infusions: . sodium chloride      LOS: 1 day   Kerney Elbe, DO [facility] Pager [phone]  If 7PM-7AM, please contact night-coverage [email or web address] Password [facility] 07/31/2017, 2:41 PM

## 2017-07-31 NOTE — Evaluation (Signed)
Physical Therapy Evaluation Patient Details Name: Bianca [name] MRN: [id number] DOB: [birth year] Today's Date: 07/31/2017   History of Present Illness  Pt is an 84 y.o. female admitted from [place] on 07/30/17 with AMS, weakness, and worsening DOE; worked up for CHF exacerbation. Pt has also had multiple UTIs recently with progressive weakness over several weeks. PMH includes OSA on CPAP, a-fib on Eliquis, CAD, CHF, HTN, CKD III, CAD, pacemaker, peripheral neuropathy.     Clinical Impression  Pt presents with an overall decrease in functional mobility secondary to above. PTA, pt lives at [facility]; reports mod indep using rollator for ambulation and ADLs, but unsure if pt was actually able to perform everything independently. Today, pt able to ambulate short distance with RW and min guard for balance; tends to drag L foot, most likely secondary to peripheral neuropathy, which significantly increases fall risk. Pt would benefit from continued acute PT services to maximize functional mobility and independence prior to d/c with SNF-level therapies.     Follow Up Recommendations SNF;Supervision for mobility/OOB    Equipment Recommendations  None recommended by PT    Recommendations for Other Services OT consult     Precautions / Restrictions Precautions Precautions: Fall Precaution Comments: Bilat foot peripheral neuropathy Restrictions Weight Bearing Restrictions: No      Mobility  Bed Mobility Overal bed mobility: Needs Assistance Bed Mobility: Sit to Supine       Sit to supine: Mod assist   General bed mobility comments: ModA to assist BLEs back into bed  Transfers Overall transfer level: Needs assistance Equipment used: Rolling walker (2 wheeled) Transfers: Sit to/from Stand Sit to Stand: Min assist;Min guard         General transfer comment: Pt stood from recliner, BSC, and toilet with RW and minA for trunk elevation, progressing to min guard for  balance; cues for proper hand placement as pt wanting to pull up on RW. Pt with good eccentric control lowering to BSC, but poor eccentric control to lower height of toilet  Ambulation/Gait Ambulation/Gait assistance: Min guard Ambulation Distance (Feet): 50 Feet Assistive device: Rolling walker (2 wheeled) Gait Pattern/deviations: Step-to pattern;Trunk flexed Gait velocity: Decreased Gait velocity interpretation: <1.8 ft/sec, indicative of risk for recurrent falls General Gait Details: Slow, slightly unsteady amb with RW and min guard for balance; pt declining further distance secondary to fatigue  Stairs            Wheelchair Mobility    Modified Rankin (Stroke Patients Only)       Balance Overall balance assessment: Needs assistance   Sitting balance-Leahy Scale: Fair       Standing balance-Leahy Scale: Poor Standing balance comment: Reliant on UE support                             Pertinent Vitals/Pain Pain Assessment: Faces Faces Pain Scale: Hurts a little bit Pain Location: BLEs (R>L) Pain Descriptors / Indicators: Constant;Discomfort Pain Intervention(s): Monitored during session    Home Living Family/patient expects to be discharged to:: Assisted living               Home Equipment: Walker - 4 wheels;Walker - 2 wheels;Cane - single point;Shower seat Additional Comments: Pt currently living at [facility]    Prior Function Level of Independence: Needs assistance   Gait / Transfers Assistance Needed: Pt able to ambulate to/from dining room with rollator; recently, has required rest breaks along  the way secondary to BLE pain  ADL's / Homemaking Assistance Needed: Pt reports indep with ADLS; unsure if this reliable information        Hand Dominance   Dominant Hand: Right    Extremity/Trunk Assessment   Upper Extremity Assessment Upper Extremity Assessment: Generalized weakness    Lower Extremity Assessment Lower  Extremity Assessment: Generalized weakness;RLE deficits/detail;LLE deficits/detail RLE Sensation: history of peripheral neuropathy LLE Sensation: history of peripheral neuropathy       Communication   Communication: HOH  Cognition Arousal/Alertness: Awake/alert Behavior During Therapy: WFL for tasks assessed/performed Overall Cognitive Status: Impaired/Different from baseline Area of Impairment: Memory;Attention;Following commands;Safety/judgement;Problem solving                   Current Attention Level: Selective Memory: Decreased short-term memory Following Commands: Follows multi-step commands with increased time;Follows multi-step commands inconsistently Safety/Judgement: Decreased awareness of safety   Problem Solving: Difficulty sequencing;Requires verbal cues;Requires tactile cues General Comments: Son reports cognition returning more to baseline today except he has noticed deficits with memory (i.e. calling son by husband's name)      General Comments General comments (skin integrity, edema, etc.): Son present during session    Exercises     Assessment/Plan    PT Assessment Patient needs continued PT services  PT Problem List Decreased strength;Decreased activity tolerance;Decreased balance;Decreased mobility;Decreased cognition;Decreased knowledge of use of DME;Decreased safety awareness       PT Treatment Interventions DME instruction;Gait training;Functional mobility training;Therapeutic activities;Therapeutic exercise;Balance training;Patient/family education    PT Goals (Current goals can be found in the Care Plan section)  Acute Rehab PT Goals Patient Stated Goal: Get stronger PT Goal Formulation: With patient/family Time For Goal Achievement: 08/14/17 Potential to Achieve Goals: Good    Frequency Min 2X/week   Barriers to discharge Decreased caregiver support Does not have necessary assist level at current ALF    Co-evaluation                AM-PAC PT "6 Clicks" Daily Activity  Outcome Measure Difficulty turning over in bed (including adjusting bedclothes, sheets and blankets)?: None Difficulty moving from lying on back to sitting on the side of the bed? : Unable Difficulty sitting down on and standing up from a chair with arms (e.g., wheelchair, bedside commode, etc,.)?: Unable Help needed moving to and from a bed to chair (including a wheelchair)?: A Little Help needed walking in hospital room?: A Little Help needed climbing 3-5 steps with a railing? : A Lot 6 Click Score: 14    End of Session Equipment Utilized During Treatment: Gait belt Activity Tolerance: Patient tolerated treatment well Patient left: in bed;with call bell/phone within reach;with family/visitor present Nurse Communication: Mobility status PT Visit Diagnosis: Other abnormalities of gait and mobility (R26.89);Muscle weakness (generalized) (M62.81)    Time: 3846-6599 PT Time Calculation (min) (ACUTE ONLY): 44 min   Charges:   PT Evaluation $PT Eval Moderate Complexity: 1 Mod PT Treatments $Gait Training: 8-22 mins   PT G Codes:       Mabeline Caras, PT, DPT Acute Rehab Services  Pager: [place] 07/31/2017, 2:18 PM

## 2017-07-31 NOTE — Progress Notes (Signed)
Progress Note  Patient Name: Bianca [name] Date of Encounter: 07/31/2017  Primary Cardiologist: Dr Marlou Porch  Subjective   No chest pain; dyspnea improving  Inpatient Medications    Scheduled Meds: . allopurinol  400 mg Oral Daily  . apixaban  5 mg Oral BID  . atorvastatin  10 mg Oral q1800  . calcium-vitamin D  1 tablet Oral BID WC  . cephALEXin  500 mg Oral TID  . chlorhexidine  15 mL Mouth Rinse BID  . cholecalciferol  1,000 Units Oral Daily  . colestipol  2 g Oral QHS  . furosemide  80 mg Intravenous BID  . gabapentin  800 mg Oral TID  . guaiFENesin  600 mg Oral BID  . hydrALAZINE  10 mg Oral TID  . isosorbide mononitrate  30 mg Oral Daily  . mouth rinse  15 mL Mouth Rinse q12n4p  . metoprolol succinate  25 mg Oral Daily  . nortriptyline  50 mg Oral QHS  . potassium chloride SA  40 mEq Oral BID  . sodium chloride flush  3 mL Intravenous Q12H  . spironolactone  12.5 mg Oral Daily   Continuous Infusions: . sodium chloride     PRN Meds: sodium chloride, acetaminophen, ondansetron (ZOFRAN) IV, sodium chloride flush   Vital Signs    Vitals:   07/30/17 2027 07/31/17 0131 07/31/17 0311 07/31/17 0512  BP: 99/71 112/68  105/69  Pulse: 98 100 85 78  Resp: 20 19    Temp: 98.6 F (37 C) 98.6 F (37 C)  98.2 F (36.8 C)  TempSrc: Oral Oral  Oral  SpO2: 95% 91%  97%  Weight:    218 lb 1.6 oz (98.9 kg)  Height:        Intake/Output Summary (Last 24 hours) at 07/31/2017 0852 Last data filed at 07/31/2017 0513 Gross per 24 hour  Intake 700 ml  Output 2500 ml  Net -1800 ml   Filed Weights   07/30/17 0815 07/30/17 1721 07/31/17 0512  Weight: 200 lb (90.7 kg) 221 lb 9 oz (100.5 kg) 218 lb 1.6 oz (98.9 kg)    Telemetry     -Sinus with V pacing and PVCs Personally Reviewed  Physical Exam   GEN: Obese No acute distress.   Neck: No JVD Cardiac: RRR, no murmurs, rubs, or gallops.  Respiratory: Clear to auscultation bilaterally. GI: Soft, nontender,  non-distended  MS: trace edema Neuro:  Nonfocal  Psych: Normal affect   Labs    Chemistry Recent Labs  Lab 07/28/17 1102 07/30/17 0817 07/31/17 0517  NA 135 140 140  K 4.0 2.5* 3.1*  CL 101 98* 97*  CO2 25 26 26   GLUCOSE 117* 99 98  BUN 25* 24* 23*  CREATININE 1.10* 1.16* 1.03*  CALCIUM 9.0 9.2 9.4  PROT 7.2 6.5  --   ALBUMIN 4.1 3.8  --   AST 21 23  --   ALT 14 15  --   ALKPHOS 65 65  --   BILITOT 1.2 1.7*  --   GFRNONAA 45* 43* 49*  GFRAA 53* 49* 57*  ANIONGAP 9 16* 17*     Hematology Recent Labs  Lab 07/28/17 1102 07/30/17 0838  WBC 10.0 10.6*  RBC 3.82* 4.03  HGB 12.2 12.6  HCT 36.7 39.4  MCV 96.1 97.8  MCH 31.9 31.3  MCHC 33.2 32.0  RDW 14.4 14.6  PLT 196 210    Recent Labs  Lab 07/28/17 1119 07/30/17 0852  TROPIPOC 0.02 0.05  BNP Recent Labs  Lab 07/30/17 0842  BNP 1,664.3*      Radiology    Dg Chest 2 View  Result Date: 07/30/2017 CLINICAL DATA:  Cough, weakness, coronary disease post MI, stage III chronic kidney disease, chronic systolic heart failure, hypertension, paroxysmal atrial fibrillation EXAM: CHEST  2 VIEW COMPARISON:  07/28/2017 FINDINGS: LEFT subclavian AICD leads project at RIGHT atrium, RIGHT ventricle and coronary sinus. Enlargement of cardiac silhouette with pulmonary vascular congestion. Mild interstitial infiltrates new since previous exam likely reflecting mild pulmonary edema. No gross pleural effusion or pneumothorax. Numerous EKG leads project over chest. No acute osseous findings. IMPRESSION: Mild CHF. Electronically Signed   By: Lavonia Dana M.D.   On: 07/30/2017 09:26    Patient Profile     84 year old female with past medical history of nonischemic cardiomyopathy, prior biventricular ICD, sleep apnea, chronic stage III kidney disease, paroxysmal atrial fibrillation for evaluation of congestive heart failure.    Assessment & Plan    1 acute on chronic systolic congestive heart failure-Volume status  improving; will continue present dose of lasix and follow renal function.   2 cardiomyopathy-patient has a history of severely reduced LV function.  She has a history of angioedema with ACE inhibitor.  Continue hydralazine/nitrates. DC metoprolol; add coreg 3.125 mg BID. Patient had an echocardiogram in November and I do not think this needs to be repeated today.  3 paroxysmal atrial fibrillation-patient presently in sinus rhythm.  Continue beta-blocker for rate control if atrial fibrillation recurs.  Continue apixaban.  4  chronic stage III kidney disease-follow renal function with diuresis.  5 altered mental status-I do not think this is cardiac related; further WU per IM.  6 Hypokalemia-supplement    For questions or updates, please contact [place] Please consult [email or web address] for contact info under Cardiology/STEMI.      Signed, Kirk Ruths, MD  07/31/2017, 8:52 AM

## 2017-08-01 ENCOUNTER — Inpatient Hospital Stay (HOSPITAL_COMMUNITY): Payer: PPO

## 2017-08-01 DIAGNOSIS — R0602 Shortness of breath: Secondary | ICD-10-CM

## 2017-08-01 DIAGNOSIS — R05 Cough: Secondary | ICD-10-CM

## 2017-08-01 LAB — CBC WITH DIFFERENTIAL/PLATELET
Basophils Absolute: 0 10*3/uL (ref 0.0–0.1)
Basophils Relative: 1 %
Eosinophils Absolute: 0.5 10*3/uL (ref 0.0–0.7)
Eosinophils Relative: 6 %
HEMATOCRIT: 39.3 % (ref 36.0–46.0)
Hemoglobin: 12.7 g/dL (ref 12.0–15.0)
LYMPHS PCT: 26 %
Lymphs Abs: 2.3 10*3/uL (ref 0.7–4.0)
MCH: 31.5 pg (ref 26.0–34.0)
MCHC: 32.3 g/dL (ref 30.0–36.0)
MCV: 97.5 fL (ref 78.0–100.0)
Monocytes Absolute: 0.7 10*3/uL (ref 0.1–1.0)
Monocytes Relative: 9 %
NEUTROS ABS: 5.1 10*3/uL (ref 1.7–7.7)
Neutrophils Relative %: 58 %
Platelets: 234 10*3/uL (ref 150–400)
RBC: 4.03 MIL/uL (ref 3.87–5.11)
RDW: 14.6 % (ref 11.5–15.5)
WBC: 8.7 10*3/uL (ref 4.0–10.5)

## 2017-08-01 LAB — COMPREHENSIVE METABOLIC PANEL
ALK PHOS: 63 U/L (ref 38–126)
ALT: 16 U/L (ref 14–54)
AST: 23 U/L (ref 15–41)
Albumin: 3.5 g/dL (ref 3.5–5.0)
Anion gap: 15 (ref 5–15)
BILIRUBIN TOTAL: 1.4 mg/dL — AB (ref 0.3–1.2)
BUN: 24 mg/dL — AB (ref 6–20)
CO2: 30 mmol/L (ref 22–32)
CREATININE: 1.16 mg/dL — AB (ref 0.44–1.00)
Calcium: 9.5 mg/dL (ref 8.9–10.3)
Chloride: 95 mmol/L — ABNORMAL LOW (ref 101–111)
GFR calc Af Amer: 49 mL/min — ABNORMAL LOW (ref >60)
GFR, EST NON AFRICAN AMERICAN: 43 mL/min — AB (ref >60)
Glucose, Bld: 113 mg/dL — ABNORMAL HIGH (ref 65–99)
Potassium: 3.4 mmol/L — ABNORMAL LOW (ref 3.5–5.1)
Sodium: 140 mmol/L (ref 135–145)
TOTAL PROTEIN: 6.6 g/dL (ref 6.5–8.1)

## 2017-08-01 LAB — MAGNESIUM: MAGNESIUM: 1.8 mg/dL (ref 1.7–2.4)

## 2017-08-01 LAB — PHOSPHORUS: Phosphorus: 4 mg/dL (ref 2.5–4.6)

## 2017-08-01 MED ORDER — NYSTATIN 100000 UNIT/GM EX POWD
Freq: Three times a day (TID) | CUTANEOUS | Status: DC
  Filled 2017-08-01: qty 15

## 2017-08-01 NOTE — Progress Notes (Signed)
PT Cancellation Note  Patient Details Name: Bianca [name] MRN: [id number] DOB: Nov 25, 1932   Cancelled Treatment:    Reason Eval/Treat Not Completed: (P) Pain limiting ability to participate(Pt reports increased B from her neuropathy and reports she cannot participate due to pain.  Educated and informed patient of benefits of PT tx.  Pt remains to decline.  Will f/u per POC.  )   Jash Wahlen Eli Hose 08/01/2017, 1:30 PM  Governor Rooks, PTA pager [phone]

## 2017-08-01 NOTE — Clinical Social Work Note (Signed)
Clinical Social Work Assessment  Patient Details  Name: Bianca [name] MRN: [id number] Date of Birth: 1932-12-01  Date of referral:  08/01/17               Reason for consult:  Facility Placement, Discharge Planning                Permission sought to share information with:  Family Supports Permission granted to share information::  Yes, Verbal Permission Granted  Name::     Aizlynn Digilio  Agency::     Relationship::  Daughter-in-law  Contact Information:  [phone]  Housing/Transportation Living arrangements for the past 2 months:  [place] of Information:  Patient, Medical Team Patient Interpreter Needed:  None Criminal Activity/Legal Involvement Pertinent to Current Situation/Hospitalization:  No - Comment as needed Significant Relationships:  Adult Children Lives with:  Facility Resident Do you feel safe going back to the place where you live?  No Need for family participation in patient care:  Yes (Comment)  Care giving concerns:  Patient is a resident at [facility]. PT currently recommending SNF once medically stable for discharge.   Social Worker assessment / plan:  CSW met with patient. Daughter-in-law at bedside. CSW familiar with patient and her family from previous admissions. Patient and her daughter-in-law prefer for her to return to [facility]. There are nurses that they can private pay for to come in in the mornings, make sure she takes her medications, etc. They are also agreeable to home health if [place] thinks she needs it. Per RN, patient ambulated really well yesterday afternoon. PT to come in again to re-evaluate. No further concerns. CSW signing off as social work intervention no longer needed.  Employment status:  Retired, [employer] information:  Other (Comment Required)([employer]) PT Recommendations:  [place] / Referral to community resources:   [place]  Patient/Family's Response to care:  Patient and her daughter-in-law prefer home health. Patient's family supportive and involved in patient's care. Patient and her daughter-in-law appreciated social work intervention.  Patient/Family's Understanding of and Emotional Response to Diagnosis, Current Treatment, and Prognosis:  Patient and her daughter-in-law have a good understanding of the reason for admission and social work intervention. Patient and her daughter appear happy with hospital care.  Emotional Assessment Appearance:  Appears stated age Attitude/Demeanor/Rapport:  Engaged Affect (typically observed):  Appropriate, Calm, Pleasant Orientation:  Oriented to Self, Oriented to Place, Oriented to  Time, Oriented to Situation Alcohol / Substance use:  Never Used Psych involvement (Current and /or in the community):  No (Comment)  Discharge Needs  Concerns to be addressed:  Care Coordination Readmission within the last 30 days:  No Current discharge risk:  Lives alone Barriers to Discharge:  Continued Medical Work up   Candie Chroman, LCSW 08/01/2017, 12:18 PM

## 2017-08-01 NOTE — Progress Notes (Signed)
TOC f/u scheduled 2/25, appt placed in f/u section. Dayna Dunn PA-C

## 2017-08-01 NOTE — Consult Note (Signed)
   [facility] Inpatient Consult   08/01/2017  Bianca [name] Jul 04, 1932 [id number]  Patient screened for post hospital need [facility] services. Patient is in the [place] of the [facility] services under patient's [employer] Medicare plan.  Met with the patient and her daughter in law, [name] and explained services in the network. [name] states that the patient lives in [place] at [facility] in a studio apartment.  Patient was independent prior to admission and with this UTI was found confused and weak.  Current chart review from PT/OT notes are recommending skilled nursing but the patient and daughter in law feels she has improved tremendously since last assessment and wants to return home with follow up. Consent for services obtained, a folder with information was given.  She endorses Maurice Small, MD with [facility], is her primary care provider. Will notify the provider office of [facility] involvement for transition of care. For questions contact:   Natividad Brood, RN BSN [facility] Liaison  [phone] business mobile phone Toll free office [phone]

## 2017-08-01 NOTE — Progress Notes (Signed)
Patient confused at moments. Thinks she's at home. Patient easily reoriented.

## 2017-08-01 NOTE — Progress Notes (Signed)
Progress Note  Patient Name: Bianca [name] Date of Encounter: 08/01/2017  Primary Cardiologist: Dr Marlou Porch  Subjective   Denies CP or dyspnea  Inpatient Medications    Scheduled Meds: . allopurinol  400 mg Oral Daily  . apixaban  5 mg Oral BID  . atorvastatin  10 mg Oral q1800  . calcium-vitamin D  1 tablet Oral BID WC  . cephALEXin  500 mg Oral TID  . chlorhexidine  15 mL Mouth Rinse BID  . cholecalciferol  1,000 Units Oral Daily  . colestipol  2 g Oral QHS  . furosemide  80 mg Intravenous BID  . gabapentin  400 mg Oral TID  . guaiFENesin  600 mg Oral BID  . hydrALAZINE  10 mg Oral TID  . isosorbide mononitrate  30 mg Oral Daily  . mouth rinse  15 mL Mouth Rinse q12n4p  . metoprolol succinate  25 mg Oral Daily  . nortriptyline  50 mg Oral QHS  . potassium chloride SA  40 mEq Oral BID  . sodium chloride flush  3 mL Intravenous Q12H  . spironolactone  12.5 mg Oral Daily   Continuous Infusions: . sodium chloride     PRN Meds: sodium chloride, acetaminophen, ondansetron (ZOFRAN) IV, sodium chloride flush   Vital Signs    Vitals:   07/31/17 1205 07/31/17 1635 07/31/17 2010 08/01/17 0447  BP: (!) 106/49 104/69 102/60 101/71  Pulse: 89 78 88 86  Resp: 18  18 18   Temp: (!) 97.4 F (36.3 C)  98.5 F (36.9 C) 98.1 F (36.7 C)  TempSrc: Oral  Oral Oral  SpO2: 96% 96% 100% 94%  Weight:    216 lb 14.4 oz (98.4 kg)  Height:        Intake/Output Summary (Last 24 hours) at 08/01/2017 0822 Last data filed at 08/01/2017 0452 Gross per 24 hour  Intake 960 ml  Output 400 ml  Net 560 ml   Filed Weights   07/30/17 1721 07/31/17 0512 08/01/17 0447  Weight: 221 lb 9 oz (100.5 kg) 218 lb 1.6 oz (98.9 kg) 216 lb 14.4 oz (98.4 kg)    Telemetry     -Sinus with V pacing and rare PVC Personally Reviewed  Physical Exam   GEN: WD Obese No acute distress.   Neck: No JVD, supple Cardiac: RRR Respiratory: CTA GI: Soft, nontender, non-distended, no masses MS: no  edema Neuro:  Grossly intact   Labs    Chemistry Recent Labs  Lab 07/28/17 1102 07/30/17 0817 07/31/17 0517 08/01/17 0548  NA 135 140 140 140  K 4.0 2.5* 3.1* 3.4*  CL 101 98* 97* 95*  CO2 25 26 26 30   GLUCOSE 117* 99 98 113*  BUN 25* 24* 23* 24*  CREATININE 1.10* 1.16* 1.03* 1.16*  CALCIUM 9.0 9.2 9.4 9.5  PROT 7.2 6.5  --  6.6  ALBUMIN 4.1 3.8  --  3.5  AST 21 23  --  23  ALT 14 15  --  16  ALKPHOS 65 65  --  63  BILITOT 1.2 1.7*  --  1.4*  GFRNONAA 45* 43* 49* 43*  GFRAA 53* 49* 57* 49*  ANIONGAP 9 16* 17* 15     Hematology Recent Labs  Lab 07/30/17 0838 07/31/17 1047 08/01/17 0548  WBC 10.6* 8.9 8.7  RBC 4.03 4.19 4.03  HGB 12.6 13.3 12.7  HCT 39.4 41.2 39.3  MCV 97.8 98.3 97.5  MCH 31.3 31.7 31.5  MCHC 32.0 32.3 32.3  RDW 14.6 14.9 14.6  PLT 210 226 234    Recent Labs  Lab 07/28/17 1119 07/30/17 0852  TROPIPOC 0.02 0.05     BNP Recent Labs  Lab 07/30/17 0842  BNP 1,664.3*      Radiology    Dg Chest 2 View  Result Date: 07/30/2017 CLINICAL DATA:  Cough, weakness, coronary disease post MI, stage III chronic kidney disease, chronic systolic heart failure, hypertension, paroxysmal atrial fibrillation EXAM: CHEST  2 VIEW COMPARISON:  07/28/2017 FINDINGS: LEFT subclavian AICD leads project at RIGHT atrium, RIGHT ventricle and coronary sinus. Enlargement of cardiac silhouette with pulmonary vascular congestion. Mild interstitial infiltrates new since previous exam likely reflecting mild pulmonary edema. No gross pleural effusion or pneumothorax. Numerous EKG leads project over chest. No acute osseous findings. IMPRESSION: Mild CHF. Electronically Signed   By: Lavonia Dana M.D.   On: 07/30/2017 09:26   Dg Chest Port 1 View  Result Date: 08/01/2017 CLINICAL DATA:  CHF, coronary artery disease and previous MI, respiratory failure. EXAM: PORTABLE CHEST 1 VIEW COMPARISON:  Chest x-ray of July 30, 2017 FINDINGS: The lungs are adequately inflated. The  interstitial markings remain increased. There is no alveolar infiltrate or pleural effusion. The cardiac silhouette remains enlarged and the pulmonary vascularity engorged. There is no change in the position of the ICD. The bony thorax is unremarkable. IMPRESSION: CHF with mild interstitial edema, stable.  No acute pneumonia. Electronically Signed   By: David  Martinique M.D.   On: 08/01/2017 07:53    Patient Profile     84 year old female with past medical history of nonischemic cardiomyopathy, prior biventricular ICD, sleep apnea, chronic stage III kidney disease, paroxysmal atrial fibrillation for evaluation of congestive heart failure.    Assessment & Plan    1 acute on chronic systolic congestive heart failure-Pt denies dyspnea and not volume overloaded on exam. Would DC lasix and resume demadex 80 mg in AM and 40 mg in PM; continue spironolactone and metolazone; take an additional 20 mg for edema or weight gain of 2-3 lbs. Needs low Na diet and fluid restriction to 1500 cc daily.  2 cardiomyopathy-patient has a history of severely reduced LV function.  She has a history of angioedema with ACE inhibitor.  Continue hydralazine/nitrates. Continue toprol (fatigue with coreg in past).   3 paroxysmal atrial fibrillation-patient presently in sinus rhythm.  Continue beta-blocker for rate control if atrial fibrillation recurs.  Continue apixaban.  4  chronic stage III kidney disease-fu as outpt.  5 altered mental status-I do not think this is cardiac related; further WU per IM.  6 Hypokalemia-supplement  We will sign off; please call with questions. FU 2-4 weeks with Dr Marlou Porch.  For questions or updates, please contact [place] Please consult [email or web address] for contact info under Cardiology/STEMI.      Signed, Kirk Ruths, MD  08/01/2017, 8:22 AM

## 2017-08-01 NOTE — Progress Notes (Signed)
PROGRESS NOTE    Bianca [name]  BZJ:[id number] DOB: 13-Jun-1933 DOA: 07/30/2017 PCP: Maurice Small, MD   Brief Narrative: Bianca [name] is a 84 y.o. female with a Past Medical History of OSA on CPAP; afib on Eliquis; CAD; combined chronic heart failure (EF 20%, grade 2 DD in 11/18); HTN; stage 3 CKD; Dyslipidema; [place] CAD who presents with AMS and weakness.  Patient/son describe progressive weakness for several weeks.  Over that time, she has been treated for UTI 3 times including most recently 2 days ago with Keflex.  She has had increased LE edema and worsening DOE with orthopnea. Currently being diuresed with IV Lasix and Cardiology followed.   Assessment & Plan:   Principal Problem:   Acute on chronic combined systolic and diastolic heart failure (HCC) Active Problems:   Acute respiratory failure with hypoxia (HCC)   Recurrent UTI   Altered mental status   Obstructive sleep apnea on CPAP   Yeast dermatitis   Hypokalemia   Atrial fibrillation, transient (HCC)   Arthritis of right knee/neuropathy   Class 1 obesity with body mass index (BMI) of 32.0 to 32.9 in adult   Dyslipidemia   CKD (chronic kidney disease), symptom management only, stage 3 (moderate) (HCC)  Acute on chronic Combined Systolic and Grade 2 Diastolic Heart Failure  -Presented with progressive fatigue and altered mental status with elevated BNP, peripheral edema, and abnormal chest x-ray concerning for mild exacerbation of CHF -Cardiology consulted and appreciate recc's; Recommending continuing current present dose of IV Lasix 80 mg BID today and following Renal Fxn in AM; Recommending resuming Demadex 80 mg po in AM and 40 mg in PM and continuing Spironolactone and Metolazone; Recommending taking an additional 20 mg po Edema for a weight gain of 2-3 lbs -Previous EF November 2018 20% with diffuse hypokinesis and grade 2 diastolic dysfunction with moderate MR; Per Cardiology will not repeat ECHO -Continue  beta-blocker per Cardiology; Is on Metoprolol  -No ARB/ACE inhibitor secondary to CKD and allergy -C/w Spironolactone 12.5 mg po Daily  -C/w Hydralazine 10 mg TID and Isosorbide Mononitrate 30 mg po Daily  -Daily Weights and Strict I's/O;  -Patient is -1.000 Liters; Weight is now 216 -Volume status monitored in the outpatient setting utilizing Optivol -Follow electrolytes -in situ AICD/BiV pacer -Per Cardiology recommending changing to Home Regimen in AM -PT to Re-evaluate given improvement in Mental Status  -Patient has follow up scheduled for 2/25  Acute Respiratory Failure with Hypoxia, improved   -? Due to CHF vs deconditioning in context chronic HF/dysmobility 2/2 knee arthritis -C/w Supplemental O2 via [place]; Weaned off -C/w IV Diuresis as Above -Repeat CXR this AM showed CHF with mild interstitial edema which was stable and no Acute PNA  Recurrent UTI -As above; does not appear to have overt UTI at present -Continue preadmit Cephalexin 500 mg po TID for total of 7 days -FU urine cx obtained in ER shows No Growth  Altered Mental Status, improved etiology  -Unclear Etiology but on several medications and likely 2/2 to Polypharmacy and less likely infectious  -On multiple offending meds prior to admission: Tylenol PM, Neurontin, Pamelor and melatonin -Gabapentin dose decreased to 400 mg TID, C/w Nortriptyline 50 mg po qHS -Will D/C on lower dose of Gabapentin   Yeast Dermatitis/Interriginous Skin damage 2/2 to Moisture -Nystatin powder 3 times daily to affected areas discontinued by [place] Nurse  -Given 1x dose of IV Fluconazole  -WOC RN consult appreciated -Air mattress -Urinary incontinence likely trigger;  Getting Inter-Dry per [place] Nurse -Continue to Monitor   Hypokalemia, improving -Patient's K+ went from 2.5 -> 3.1 -> 3.4 -C/w po KCl 40 mEQ BID and Spironolactone 12.5 mg po Daily  -Continue to Monitor and Replete as Necessary -Repeat CMP in AM   Paroxysmal Atrial  Fibrillation  -Rate controlled on beta-blocker -C/w Anticoagulation with Apixaban  -CHA2DS2-VASc=4 -C/w Telemetry   CKD Stage 3 -BUN/Cr went from 24/1.16 -> 23/1.03 -> 24/1.16 -Avoid Nephrotoxic Medications if possible; Resume home diuresis in AM  -Continue to Monitor Renal Fxn and repeat CMP in AM  Obstructive sleep apnea on CPAP -Reports difficulty with current mask-trial nasal mask -Bleed in O2  Arthritis of right knee/neuropathy -PT/OT eval recommending SNF initially; Will have them re-evaluate  -Decreased Gabapentin 800 mg TID to 400 mg TID given recent confusion   Class 1 obesity with body mass index (BMI) of 32.0 to 32.9 in adult -Weight loss Counseling given   Hyperlipidemia/Dyslipidema -C/w Atorvastatin 10 mg po qHS and Colestipol 2 grams po qHS  Hyperbilirubinemia -Improving. T Bili went from 1.7 -> 1.4 -Repeat CMP in AM   DVT prophylaxis: Anticoagulated with Apixaban 5 mg po BID Code Status: FULL CODE Family Communication: Discussed with Daughter Disposition Plan: Anticipate D/C to SNF when medically stable  Consultants:   Cardiology Dr. Kirk Ruths   Procedures:  None   Antimicrobials:  Anti-infectives (From admission, onward)   Start     Dose/Rate Route Frequency Ordered Stop   07/30/17 1600  cephALEXin (KEFLEX) capsule 500 mg     500 mg Oral 3 times daily 07/30/17 1240     07/30/17 1415  fluconazole (DIFLUCAN) IVPB 200 mg     200 mg 100 mL/hr over 60 Minutes Intravenous  Once 07/30/17 1400 07/30/17 2115     Subjective: Seen and examined and was extremely lucid today. Ambulating with walker with no issues. No CP or SOB. Felt better and Daughter states Mentation is baseline. No lightheadedness or dizziness. No other concerns or complaints at this time.   Objective: Vitals:   07/31/17 1635 07/31/17 2010 08/01/17 0447 08/01/17 0958  BP: 104/69 102/60 101/71 107/85  Pulse: 78 88 86 86  Resp:  18 18   Temp:  98.5 F (36.9 C) 98.1 F (36.7  C)   TempSrc:  Oral Oral   SpO2: 96% 100% 94% 95%  Weight:   98.4 kg (216 lb 14.4 oz)   Height:        Intake/Output Summary (Last 24 hours) at 08/01/2017 1140 Last data filed at 08/01/2017 0915 Gross per 24 hour  Intake 960 ml  Output 400 ml  Net 560 ml   Filed Weights   07/30/17 1721 07/31/17 0512 08/01/17 0447  Weight: 100.5 kg (221 lb 9 oz) 98.9 kg (218 lb 1.6 oz) 98.4 kg (216 lb 14.4 oz)   Examination: Physical Exam:  Constitutional: WN/WD obese Caucasian female in NAD and appears calm and comfortable sitting in chair Eyes: Lids and conjunctivae normal. Sclerae anicteric ENMT: External Ears and normal Neck: Appears supple. No appreciable JVD Respiratory: Diminished to auscultation. No appreciable wheezing/rales/rhonchi. Not wearing Supplemental O2 today Cardiovascular: RRR; no appreciable m/r/g. Mild edema Abdomen: Soft, NT, ND. Bowel sounds present GU: Deferred. Musculoskeletal: No contractures; No cyanosis Skin: Did not view dermatitis today. Warm and dry Neurologic: CN 2-12 grossly intact. No appreciable focal deficits Psychiatric: Normal mood and affect. Intact judgement and insight  Data Reviewed: I have personally reviewed following labs and imaging studies  CBC: Recent  Labs  Lab 07/28/17 1102 07/30/17 0838 07/31/17 1047 08/01/17 0548  WBC 10.0 10.6* 8.9 8.7  NEUTROABS 6.3 7.6 6.2 5.1  HGB 12.2 12.6 13.3 12.7  HCT 36.7 39.4 41.2 39.3  MCV 96.1 97.8 98.3 97.5  PLT 196 210 226 412   Basic Metabolic Panel: Recent Labs  Lab 07/28/17 1102 07/30/17 0817 07/31/17 0517 08/01/17 0548  NA 135 140 140 140  K 4.0 2.5* 3.1* 3.4*  CL 101 98* 97* 95*  CO2 25 26 26 30   GLUCOSE 117* 99 98 113*  BUN 25* 24* 23* 24*  CREATININE 1.10* 1.16* 1.03* 1.16*  CALCIUM 9.0 9.2 9.4 9.5  MG  --  1.9  --  1.8  PHOS  --   --   --  4.0   GFR: Estimated Creatinine Clearance: 44.2 mL/min (A) (by C-G formula based on SCr of 1.16 mg/dL (H)). Liver Function Tests: Recent  Labs  Lab 07/28/17 1102 07/30/17 0817 08/01/17 0548  AST 21 23 23   ALT 14 15 16   ALKPHOS 65 65 63  BILITOT 1.2 1.7* 1.4*  PROT 7.2 6.5 6.6  ALBUMIN 4.1 3.8 3.5   No results for input(s): LIPASE, AMYLASE in the last 168 hours. Recent Labs  Lab 07/28/17 1403  AMMONIA 16   Coagulation Profile: Recent Labs  Lab 07/28/17 1102  INR 1.72   Cardiac Enzymes: No results for input(s): CKTOTAL, CKMB, CKMBINDEX, TROPONINI in the last 168 hours. BNP (last 3 results) No results for input(s): PROBNP in the last 8760 hours. HbA1C: No results for input(s): HGBA1C in the last 72 hours. CBG: Recent Labs  Lab 07/30/17 0927  GLUCAP 91   Lipid Profile: No results for input(s): CHOL, HDL, LDLCALC, TRIG, CHOLHDL, LDLDIRECT in the last 72 hours. Thyroid Function Tests: No results for input(s): TSH, T4TOTAL, FREET4, T3FREE, THYROIDAB in the last 72 hours. Anemia Panel: No results for input(s): VITAMINB12, FOLATE, FERRITIN, TIBC, IRON, RETICCTPCT in the last 72 hours. Sepsis Labs: Recent Labs  Lab 07/30/17 0855 07/30/17 1244  LATICACIDVEN 1.36 1.52    Recent Results (from the past 240 hour(s))  Urine culture     Status: None   Collection Time: 07/30/17  9:55 AM  Result Value Ref Range Status   Specimen Description URINE, CATHETERIZED  Final   Special Requests NONE  Final   Culture   Final    NO GROWTH Performed at [facility], [address], [place], [place] [address]    Report Status 07/31/2017 FINAL  Final    Radiology Studies: Dg Chest Port 1 View  Result Date: 08/01/2017 CLINICAL DATA:  CHF, coronary artery disease and previous MI, respiratory failure. EXAM: PORTABLE CHEST 1 VIEW COMPARISON:  Chest x-ray of July 30, 2017 FINDINGS: The lungs are adequately inflated. The interstitial markings remain increased. There is no alveolar infiltrate or pleural effusion. The cardiac silhouette remains enlarged and the pulmonary vascularity engorged. There is no change in the  position of the ICD. The bony thorax is unremarkable. IMPRESSION: CHF with mild interstitial edema, stable.  No acute pneumonia. Electronically Signed   By: David  Martinique M.D.   On: 08/01/2017 07:53   Scheduled Meds: . allopurinol  400 mg Oral Daily  . apixaban  5 mg Oral BID  . atorvastatin  10 mg Oral q1800  . calcium-vitamin D  1 tablet Oral BID WC  . cephALEXin  500 mg Oral TID  . chlorhexidine  15 mL Mouth Rinse BID  . cholecalciferol  1,000  Units Oral Daily  . colestipol  2 g Oral QHS  . furosemide  80 mg Intravenous BID  . gabapentin  400 mg Oral TID  . guaiFENesin  600 mg Oral BID  . hydrALAZINE  10 mg Oral TID  . isosorbide mononitrate  30 mg Oral Daily  . mouth rinse  15 mL Mouth Rinse q12n4p  . metoprolol succinate  25 mg Oral Daily  . nortriptyline  50 mg Oral QHS  . potassium chloride SA  40 mEq Oral BID  . sodium chloride flush  3 mL Intravenous Q12H  . spironolactone  12.5 mg Oral Daily   Continuous Infusions: . sodium chloride      LOS: 2 days   Kerney Elbe, DO [facility] Pager [phone]  If 7PM-7AM, please contact night-coverage [email or web address] Password [email or web address] 08/01/2017, 11:40 AM

## 2017-08-02 ENCOUNTER — Inpatient Hospital Stay (HOSPITAL_COMMUNITY): Payer: PPO

## 2017-08-02 ENCOUNTER — Telehealth: Payer: Self-pay

## 2017-08-02 DIAGNOSIS — Z95810 Presence of automatic (implantable) cardiac defibrillator: Secondary | ICD-10-CM

## 2017-08-02 LAB — CBC WITH DIFFERENTIAL/PLATELET
BASOS ABS: 0 10*3/uL (ref 0.0–0.1)
Basophils Relative: 0 %
Eosinophils Absolute: 0.5 10*3/uL (ref 0.0–0.7)
Eosinophils Relative: 6 %
HEMATOCRIT: 42.3 % (ref 36.0–46.0)
Hemoglobin: 13.4 g/dL (ref 12.0–15.0)
LYMPHS ABS: 2.8 10*3/uL (ref 0.7–4.0)
Lymphocytes Relative: 34 %
MCH: 31.2 pg (ref 26.0–34.0)
MCHC: 31.7 g/dL (ref 30.0–36.0)
MCV: 98.4 fL (ref 78.0–100.0)
Monocytes Absolute: 0.6 10*3/uL (ref 0.1–1.0)
Monocytes Relative: 7 %
Neutro Abs: 4.3 10*3/uL (ref 1.7–7.7)
Neutrophils Relative %: 53 %
PLATELETS: 249 10*3/uL (ref 150–400)
RBC: 4.3 MIL/uL (ref 3.87–5.11)
RDW: 14.8 % (ref 11.5–15.5)
WBC: 8.1 10*3/uL (ref 4.0–10.5)

## 2017-08-02 LAB — COMPREHENSIVE METABOLIC PANEL
ALT: 14 U/L (ref 14–54)
AST: 21 U/L (ref 15–41)
Albumin: 3.7 g/dL (ref 3.5–5.0)
Alkaline Phosphatase: 69 U/L (ref 38–126)
Anion gap: 16 — ABNORMAL HIGH (ref 5–15)
BILIRUBIN TOTAL: 1.4 mg/dL — AB (ref 0.3–1.2)
BUN: 21 mg/dL — AB (ref 6–20)
CHLORIDE: 96 mmol/L — AB (ref 101–111)
CO2: 28 mmol/L (ref 22–32)
CREATININE: 1.13 mg/dL — AB (ref 0.44–1.00)
Calcium: 9.5 mg/dL (ref 8.9–10.3)
GFR calc Af Amer: 51 mL/min — ABNORMAL LOW (ref >60)
GFR calc non Af Amer: 44 mL/min — ABNORMAL LOW (ref >60)
Glucose, Bld: 104 mg/dL — ABNORMAL HIGH (ref 65–99)
Potassium: 3.4 mmol/L — ABNORMAL LOW (ref 3.5–5.1)
Sodium: 140 mmol/L (ref 135–145)
Total Protein: 6.7 g/dL (ref 6.5–8.1)

## 2017-08-02 LAB — PHOSPHORUS: Phosphorus: 3.7 mg/dL (ref 2.5–4.6)

## 2017-08-02 LAB — MAGNESIUM: Magnesium: 1.9 mg/dL (ref 1.7–2.4)

## 2017-08-02 MED ORDER — CEPHALEXIN 500 MG PO CAPS: 500.0000 mg | ORAL_CAPSULE | Freq: Three times a day (TID) | ORAL | 0 refills | Status: AC

## 2017-08-02 MED ORDER — TORSEMIDE 20 MG PO TABS
80.0000 mg | ORAL_TABLET | ORAL | Status: DC
  Administered 2017-08-02: 80 mg via ORAL
  Filled 2017-08-02: qty 4

## 2017-08-02 MED ORDER — GABAPENTIN 400 MG PO CAPS: 400.0000 mg | ORAL_CAPSULE | Freq: Three times a day (TID) | ORAL | 0 refills | Status: DC

## 2017-08-02 MED ORDER — TORSEMIDE 20 MG PO TABS: 20.0000 mg | ORAL_TABLET | ORAL | Status: DC

## 2017-08-02 MED ORDER — HYDRALAZINE HCL 10 MG PO TABS: 10.0000 mg | ORAL_TABLET | Freq: Three times a day (TID) | ORAL | 0 refills | Status: DC

## 2017-08-02 NOTE — Telephone Encounter (Signed)
Returned call to daughter in law [place], [place].  Patient discharged from hospital today.  She would like a call back today to review the list of heart discharge medications. Hospitalist changed several heart meds including starting her on 2 new BP meds even though patient's BP runs low.  Dr Stanford Breed had told her during rounds yesterday that no meds would change and she would be sent home on same heart meds.    Dr Marlou Porch and [name] are her providers and daughter in law is asking of one of them or another physician at [facility] would review these meds today.  She does not want to give her any heart meds until someone has reviewed the list to make sure the meds are correct.  Patient has hospital f/u appointment with Mickel Baas on 2/25.   Daughter in law requesting call back today.  Advised I would forward to the [place] office for follow up.

## 2017-08-02 NOTE — Progress Notes (Addendum)
Physical Therapy Treatment Patient Details Name: Bianca [name] MRN: [id number] DOB: 12-24-1932 Today's Date: 08/02/2017    History of Present Illness Pt is an 84 y.o. female admitted from [place] [facility] on 07/30/17 with AMS, weakness, and worsening DOE; worked up for CHF exacerbation. Pt has also had multiple UTIs recently with progressive weakness over several weeks. PMH includes OSA on CPAP, a-fib on Eliquis, CAD, CHF, HTN, CKD III, CAD, pacemaker, peripheral neuropathy.     PT Comments    Pt demonstrates good progress with mobility. Confusion has cleared and she is at her baseline cognitive level, A&Ox4. She requires min assist bed mobility, min assist transfers, and min guard assist ambulation with RW. Daughter present during session. PTA pt resided in [place]. Discharge plan updated. Upon d/c, family to provide 24-hour assist for 2-3 days. Family also to arrange CNA assist for ADLs several hours/day. Recommend HHPT as well for further strengthening and mobility training. Pt has a rollator. No further DME indicated. Plan is for d/c back to [facility] today.    Follow Up Recommendations  Home health PT;Supervision/Assistance - 24 hour     Equipment Recommendations  None recommended by PT    Recommendations for Other Services       Precautions / Restrictions Precautions Precautions: Fall Restrictions Weight Bearing Restrictions: No    Mobility  Bed Mobility           Sit to supine: Min assist   General bed mobility comments: +rail, increased time and effort  Transfers Overall transfer level: Needs assistance Equipment used: Rolling walker (2 wheeled) Transfers: Sit to/from Stand Sit to Stand: Min assist         General transfer comment: multi attempts to power up, increased time to stabilize initial standing balance  Ambulation/Gait Ambulation/Gait assistance: Min guard Ambulation Distance (Feet): 50 Feet Assistive device: Rolling walker (2 wheeled) Gait  Pattern/deviations: Step-through pattern;Trunk flexed Gait velocity: Decreased Gait velocity interpretation: <1.8 ft/sec, indicative of risk for recurrent falls General Gait Details: slow, steady gait; no SOB or fatigue noted   Stairs            Wheelchair Mobility    Modified Rankin (Stroke Patients Only)       Balance Overall balance assessment: Needs assistance Sitting-balance support: No upper extremity supported;Feet supported Sitting balance-Leahy Scale: Good     Standing balance support: Bilateral upper extremity supported;During functional activity Standing balance-Leahy Scale: Poor Standing balance comment: Reliant on UE support                            Cognition Arousal/Alertness: Awake/alert Behavior During Therapy: WFL for tasks assessed/performed Overall Cognitive Status: Within Functional Limits for tasks assessed                                 General Comments: HOH      Exercises      General Comments General comments (skin integrity, edema, etc.): daughter present during session      Pertinent Vitals/Pain Pain Assessment: No/denies pain    Home Living                      Prior Function            PT Goals (current goals can now be found in the care plan section) Acute Rehab PT Goals Patient Stated Goal: Get stronger PT  Goal Formulation: With patient/family Time For Goal Achievement: 08/14/17 Potential to Achieve Goals: Good Progress towards PT goals: Progressing toward goals    Frequency    Min 2X/week      PT Plan Discharge plan needs to be updated    Co-evaluation              AM-PAC PT "6 Clicks" Daily Activity  Outcome Measure  Difficulty turning over in bed (including adjusting bedclothes, sheets and blankets)?: None Difficulty moving from lying on back to sitting on the side of the bed? : A Lot Difficulty sitting down on and standing up from a chair with arms (e.g.,  wheelchair, bedside commode, etc,.)?: A Lot Help needed moving to and from a bed to chair (including a wheelchair)?: A Little Help needed walking in hospital room?: A Little Help needed climbing 3-5 steps with a railing? : A Lot 6 Click Score: 16    End of Session Equipment Utilized During Treatment: Gait belt Activity Tolerance: Patient tolerated treatment well Patient left: in chair;with chair alarm set;with family/visitor present Nurse Communication: Mobility status PT Visit Diagnosis: Other abnormalities of gait and mobility (R26.89);Muscle weakness (generalized) (M62.81)     Time: 8938-1017 PT Time Calculation (min) (ACUTE ONLY): 24 min  Charges:  $Gait Training: 8-22 mins $Therapeutic Activity: 8-22 mins                    G Codes:       Lorrin Goodell, PT  Office # [phone] Pager [phone]    Lorriane Shire 08/02/2017, 10:50 AM

## 2017-08-02 NOTE — Telephone Encounter (Signed)
Spoke with Pincus Large and reviewed her mother in law's medication list. There was some confusion with Hydralazine (Apresoline). It was listed on her discharge summery as the generic and brand names on two different lines. I assured her they were the same drug and confirmed this is a normal medication for the pt. She felt relieved and was thankful for the call back. She will call back if she has any further questions.

## 2017-08-02 NOTE — Care Management Important Message (Signed)
Important Message  Patient Details  Name: Bianca [name] MRN: [id number] Date of Birth: 1932-12-28   Medicare Important Message Given:  Yes    Orbie Pyo 08/02/2017, 12:05 PM

## 2017-08-02 NOTE — Progress Notes (Signed)
Occupational Therapy Treatment Patient Details Name: Bianca [name] MRN: [id number] DOB: [birth year] Today's Date: 08/02/2017    History of present illness Pt is an 84 y.o. female admitted from [place] on 07/30/17 with AMS, weakness, and worsening DOE; worked up for CHF exacerbation. Pt has also had multiple UTIs recently with progressive weakness over several weeks. PMH includes OSA on CPAP, a-fib on Eliquis, CAD, CHF, HTN, CKD III, CAD, pacemaker, peripheral neuropathy.    OT comments  Pt. Was seen to work on increasing safety with ADL and ADL transfers. Pt. Is s with all tasks tested. Pt. Is d/c home today and dtr will be staying with her for a few days. Pt. Would benefit from [facility] services at d/c.  Follow Up Recommendations  Home health OT    Equipment Recommendations       Recommendations for Other Services      Precautions / Restrictions Precautions Precautions: Fall Restrictions Weight Bearing Restrictions: No       Mobility Bed Mobility           Sit to supine: Min assist   General bed mobility comments: +rail, increased time and effort  Transfers Overall transfer level: Needs assistance Equipment used: Rolling walker (2 wheeled) Transfers: Sit to/from Stand Sit to Stand: Supervision         General transfer comment: multi attempts to power up, increased time to stabilize initial standing balance    Balance Overall balance assessment: Needs assistance Sitting-balance support: No upper extremity supported;Feet supported Sitting balance-Leahy Scale: Good     Standing balance support: Bilateral upper extremity supported;During functional activity Standing balance-Leahy Scale: Poor Standing balance comment: Reliant on UE support                           ADL either performed or assessed with clinical judgement   ADL       Grooming: Wash/dry hands;Wash/dry face;Supervision/safety;Standing               Lower Body  Dressing: Supervision/safety;Sit to/from stand   Toilet Transfer: Supervision/safety;Ambulation;Comfort height toilet;Grab bars   Toileting- Clothing Manipulation and Hygiene: Supervision/safety       Functional mobility during ADLs: Supervision/safety;Rolling walker General ADL Comments: Pt. is doing well wtih performing ADLs at S level. Pt. dtr will be staying with her for a few days.     Vision       Perception     Praxis      Cognition Arousal/Alertness: Awake/alert Behavior During Therapy: WFL for tasks assessed/performed Overall Cognitive Status: Within Functional Limits for tasks assessed                                 General Comments: [facility]        Exercises     Shoulder Instructions       General Comments daughter present during session    Pertinent Vitals/ Pain       Pain Assessment: No/denies pain  Home Living                                          Prior Functioning/Environment              Frequency           Progress Toward Goals  OT Goals(current goals can now be found in the care plan section)  Progress towards OT goals: Progressing toward goals  Acute Rehab OT Goals Patient Stated Goal: Get stronger  Plan      Co-evaluation                 AM-PAC PT "6 Clicks" Daily Activity     Outcome Measure   Help from another person eating meals?: None Help from another person taking care of personal grooming?: A Little Help from another person toileting, which includes using toliet, bedpan, or urinal?: A Little Help from another person bathing (including washing, rinsing, drying)?: A Little Help from another person to put on and taking off regular upper body clothing?: A Little Help from another person to put on and taking off regular lower body clothing?: A Little 6 Click Score: 19    End of Session        Activity Tolerance     Patient Left     Nurse Communication           Time: 6270-3500 OT Time Calculation (min): 24 min  Charges: OT General Charges $OT Visit: 1 Visit OT Treatments $Self Care/Home Management : 93-81 mins  6 clicks   Bianca [name] 08/02/2017, 11:10 AM

## 2017-08-02 NOTE — Plan of Care (Signed)
  Health Behavior/Discharge Planning: Ability to manage health-related needs will improve 08/02/2017 0032 - Progressing by Tristan Schroeder, RN   Clinical Measurements: Will remain free from infection 08/02/2017 0032 - Progressing by Tristan Schroeder, RN

## 2017-08-02 NOTE — Discharge Summary (Signed)
Physician Discharge Summary  Bianca [name] YKD:[id number] DOB: [birth year] DOA: 07/30/2017  PCP: Maurice Small, MD  Admit date: 07/30/2017 Discharge date: 08/02/2017  Admitted From: Home Disposition: Home Health PT/OT/RN/Aide  Recommendations for Outpatient Follow-up:  1. Follow up with PCP in 1-2 weeks 2. Follow up with Cardiology as an outpatient; Appointment made for 2/25 3. Follow up with Pulmonary as an outpatient for OSA  4. Please obtain CMP/CBC, Mag, Phos in one week 5. Please follow up on the following pending results:  Home Health: YES Equipment/Devices: Rollator  Discharge Condition: Stable  CODE STATUS: FULL CODE Diet recommendation: Heart Healthy Diet  Brief/Interim Summary: Bianca [name] a 84 y.o.femalewith a Past Medical History of OSA on CPAP; afib on Eliquis; CAD; combined chronic heart failure (EF 20%, grade 2 DD in 11/18); HTN; stage 3 CKD; Dyslipidema; NICM and CAD who presents with AMS and weakness. Patient/son described progressive weakness for several weeks. Over that time, she has been treated for UTI 3 times including most recently 2 days ago with Keflex. She has had increased LE edema and worsening DOE with orthopnea and found to have a CHF exacerbation. She was diuresed with IV Lasix and Cardiology followed and changed to po Torsemide today. During the hospitalization Cardiology started her on Hydralazine and Nitrates for CHF. She was also treated for UTI and continued on Keflex. Her AMS improved after Gabapentin. PT evaluated and recommended Home Health PT at D/C. Patient improved and was deemed medically stable to D/C Home and follow up with PCP and with Cardiology at D/C.  Discharge Diagnoses:  Principal Problem:   Acute on chronic combined systolic and diastolic heart failure (HCC) Active Problems:   Acute respiratory failure with hypoxia (HCC)   Recurrent UTI   Altered mental status   Obstructive sleep apnea on CPAP   Yeast dermatitis    Hypokalemia   Atrial fibrillation, transient (HCC)   Arthritis of right knee/neuropathy   Class 1 obesity with body mass index (BMI) of 32.0 to 32.9 in adult   Dyslipidemia   CKD (chronic kidney disease), symptom management only, stage 3 (moderate) (HCC)  Acute on chronic Combined Systolic and Grade 2 Diastolic Heart Failure -Presented with progressive fatigue and altered mental status with elevated BNP, peripheral edema, and abnormal chest x-ray concerning for mild exacerbation of CHF -Cardiology Consulted and appreciate recc's; Recommended IV Lasix 80 mg Bid and then Recommended resuming Demadex 80 mg po this AM and 40 mg in PM and continuing Spironolactone and Metolazone; Recommending taking an additional 20 mg po Edema for a weight gain of 2-3 lbs -Previous EF November 2018 20% with diffuse hypokinesis and grade 2 diastolic dysfunction with moderate MR; Per Cardiology will not repeat ECHO -Continue beta-blocker per Cardiology; Is on Metoprolol  -NoARB/ACE inhibitor secondary to CKD and allergy -C/w Spironolactone 12.5 mg po Daily  -C/w Hydralazine 10 mg TID and Isosorbide Mononitrate 30 mg po Daily per Cardiology  -Daily Weights and Strict I's/O;C/w Low Sodium Diet and Fluid Restriction to 1500 mL/Daily -Patient is -2.212 Liters; Weight is now 215 -Volume status monitored in the outpatient setting utilizingOptivol -Follow electrolytes as an outpatient  -in situ AICD/BiV pacer -PT to Re-evaluate given improvement in Mental Status  -Patient has follow up scheduled for 2/25  Acute Respiratory Failure with Hypoxia, improved   -? Due to CHF vs deconditioning in context chronic HF/dysmobility 2/2 knee arthritis -C/w Supplemental O2 via [place]; Weaned off but wears O2 at home as Needed -C/w Diuresis as Above -  Repeat CXR this AM showed stable cardiomegaly. Pacemaker leads appear unchanged in position. No edema or consolidation  Recurrent UTI -As above;does not appear to have overt UTI at  present -Continue preadmit Cephalexin 500 mg po TID for total of 7 days -FU urine cx obtained in ER shows No Growth  Altered Mental Status, improved etiology  -Unclear Etiology but on several medications and likely 2/2 to Polypharmacy and less likely infectious  -On multiple offending meds prior to admission: Tylenol PM, Neurontin, Pamelor and melatonin -Gabapentin dose decreased to 400 mg TID, C/w Nortriptyline 50 mg po qHS -Will D/C on lower dose of Gabapentin   Yeast Dermatitis/Interriginous Skin damage 2/2 to Moisture -Nystatin powder 3 times daily to affected areas discontinued by [facility] Nurse  -Given 1x dose of IV Fluconazole  -WOC RN consult appreciated -Air mattress -Urinaryincontinence likely trigger; Getting Inter-Dry per WOC Nurse -Continue to Monitor as an outpatient   Hypokalemia, improving -Patient's K+ went from 2.5 -> 3.1 -> 3.4 -C/w po KCl 40 mEQ BID and Spironolactone 12.5 mg po Daily  -Continue to Monitor and Replete as Necessary -Repeat CMP as an outpatient   Paroxysmal Atrial Fibrillation  -Rate controlled on beta-blocker -C/w Anticoagulation with Apixaban  -CHA2DS2-VASc=4 -C/w Telemetry   CKD Stage 3 -BUN/Cr went from 24/1.16 -> 23/1.03 -> 24/1.16 -> 21/1.13 -Avoid Nephrotoxic Medications if possible; Resume home diuresis in AM  -Continue to Monitor Renal Fxn and repeat CMP in AM  Obstructive sleep apnea on CPAP -Reports difficulty with current mask-trial nasal mask -[place] O2 -Follow up with Pulmonary as an outpatient  Arthritis of right knee/neuropathy -PT/OT eval recommending SNF initially; Will have them re-evaluate  -Decreased Gabapentin 800 mg TID to 400 mg TID given recent confusion   Class 1 obesity with body mass index (BMI) of 32.0 to 32.9 in adult -Weight loss Counseling given   Hyperlipidemia/Dyslipidema -C/w Atorvastatin 10 mg po qHS and Colestipol 2 grams po qHS  Hyperbilirubinemia -Improving. T Bili went from 1.7 ->  1.4 -Repeat CMP in AM   Discharge Instructions  Discharge Instructions    (HEART FAILURE PATIENTS) Call MD:  Anytime you have any of the following symptoms: 1) 3 pound weight gain in 24 hours or 5 pounds in 1 week 2) shortness of breath, with or without a dry hacking cough 3) swelling in the hands, feet or stomach 4) if you have to sleep on extra pillows at night in order to breathe.   Complete by:  As directed    AMB Referral to [facility]   Complete by:  As directed    Please assign to community nurse for transition of care calls and assess for home visits. Dr. Arbie Cookey Webb's office notified of [facility] involvement. 3 ED visits and now inpatient  HTA member request. Questions please call:   Natividad Brood, RN BSN [facility] Liaison  [phone] business mobile phone Toll free office [phone]   Reason for consult:  Post hospital transition of Care at [facility] - multiple ED visits family wants Care Management f/u   Diagnoses of:  Heart Failure   Expected date of contact:  1-3 days (reserved for hospital discharges)   Call MD for:  difficulty breathing, headache or visual disturbances   Complete by:  As directed    Call MD for:  extreme fatigue   Complete by:  As directed    Call MD for:  hives   Complete by:  As directed    Call MD for:  persistant dizziness or light-headedness   Complete by:  As directed    Call MD for:  persistant nausea and vomiting   Complete by:  As directed    Call MD for:  redness, tenderness, or signs of infection (pain, swelling, redness, odor or green/yellow discharge around incision site)   Complete by:  As directed    Call MD for:  severe uncontrolled pain   Complete by:  As directed    Call MD for:  temperature >100.4   Complete by:  As directed    Diet - low sodium heart healthy   Complete by:  As directed    Discharge instructions   Complete by:  As directed    Follow up with PCP and Cardiology as an outpatient (appointments  made for you). Take all medications as prescribed. If symptoms change or worsen please return to the ED for evaluation.   Increase activity slowly   Complete by:  As directed      Allergies as of 08/02/2017      Reactions   Quinapril Hcl Swelling   Tongue and throat   Tape Other (See Comments)   Plastic tape causes irritation.    Tessalon [benzonatate] Swelling, Other (See Comments)   Capsule opened in mouth, not an allergy   Neosporin [neomycin-polymyxin-gramicidin] Rash      Medication List    STOP taking these medications   bisoprolol 5 MG tablet Commonly known as:  ZEBETA   diphenhydramine-acetaminophen 25-500 MG Tabs tablet Commonly known as:  TYLENOL PM   gabapentin 800 MG tablet Commonly known as:  NEURONTIN Replaced by:  gabapentin 400 MG capsule   Melatonin 10 MG Caps     TAKE these medications   acetaminophen 325 MG tablet Commonly known as:  TYLENOL Take 325-650 mg by mouth 2 (two) times daily as needed for mild pain or headache.   allopurinol 300 MG tablet Commonly known as:  ZYLOPRIM Take 300 mg by mouth See admin instructions. Take with the 100mg  tablet to equal 400mg  daily   allopurinol 100 MG tablet Commonly known as:  ZYLOPRIM Take 100 mg by mouth See admin instructions. Takes along with a 300 mg tablet to equal 400 mg   atorvastatin 10 MG tablet Commonly known as:  LIPITOR TAKE 1 TABLET (10 MG TOTAL) BY MOUTH DAILY AT 6 PM.   CALCIUM/VITAMIN D/MINERALS 600-200 MG-UNIT Tabs Take 1 tablet by mouth 2 (two) times daily.   cephALEXin 500 MG capsule Commonly known as:  KEFLEX Take 1 capsule (500 mg total) by mouth 3 (three) times daily for 4 days.   colestipol 1 g tablet Commonly known as:  COLESTID Take 2 g by mouth at bedtime.   ELIQUIS 5 MG Tabs tablet Generic drug:  apixaban TAKE 1 TABLET TWICE DAILY What changed:    how much to take  how to take this  when to take this   gabapentin 400 MG capsule Commonly known as:   NEURONTIN Take 1 capsule (400 mg total) by mouth 3 (three) times daily. Replaces:  gabapentin 800 MG tablet   guaiFENesin 600 MG 12 hr tablet Commonly known as:  MUCINEX Take 1 tablet (600 mg total) by mouth 2 (two) times daily.   hydrALAZINE 10 MG tablet Commonly known as:  APRESOLINE Take 1 tablet (10 mg total) by mouth 3 (three) times daily.   isosorbide mononitrate 30 MG 24 hr tablet Commonly known as:  IMDUR TAKE 1 TABLET BY MOUTH EVERY DAY What changed:  how much to take  how to take this  when to take this   KLOR-CON M20 20 MEQ tablet Generic drug:  potassium chloride SA TAKE 2 TABLETS (40 MEQ TOTAL) BY MOUTH 2 (TWO) TIMES DAILY.   metolazone 2.5 MG tablet Commonly known as:  ZAROXOLYN Take 1 tablet (2.5 mg total) by mouth once a week. 1 tab every Monday   metoprolol succinate 25 MG 24 hr tablet Commonly known as:  TOPROL-XL Take 1 tablet (25 mg total) by mouth daily.   nitroGLYCERIN 0.4 MG SL tablet Commonly known as:  NITROSTAT Place 1 tablet (0.4 mg total) under the tongue every 5 (five) minutes as needed for chest pain.   nortriptyline 25 MG capsule Commonly known as:  PAMELOR Take 50 mg by mouth at bedtime. What changed:  Another medication with the same name was removed. Continue taking this medication, and follow the directions you see here.   OXYGEN Inhale 2 L/min into the lungs at bedtime as needed (uses as bedtime everynight and then occasionally iun the daytime as needed for SOB).   PROBIOTIC PO Take 1 tablet by mouth daily.   senna-docusate 8.6-50 MG tablet Commonly known as:  Senokot-S Take 1 tablet by mouth 2 (two) times daily.   spironolactone 25 MG tablet Commonly known as:  ALDACTONE Take 0.5 tablets (12.5 mg total) by mouth daily.   torsemide 20 MG tablet Commonly known as:  DEMADEX Take 4 tablets (80 mg total) every morning and 2 tablets (40 mg total) six hours after morning dosage daily. What changed:    how much to  take  how to take this  when to take this  additional instructions   Vitamin D3 1000 units Caps Take 1,000 Units by mouth daily.      Follow-up Information    Isaiah Serge, NP Follow up.   Specialties:  Cardiology, Radiology Why:  [facility] 08/12/17 at 2:30pm. Arrive 15 minutes prior to appointment to register. Mickel Baas is a Designer, jewellery that works with Dr. Marlou Porch (you've seen her several times). Contact information: [address] [place] [address] [phone]        Maurice Small, MD. Go on 08/06/2017.   Specialty:  Family Medicine Why:  @11 :15am Contact information: [address] [place] [address] [phone]          Allergies  Allergen Reactions  . Quinapril Hcl Swelling    Tongue and throat  . Tape Other (See Comments)    Plastic tape causes irritation.   Lavella Lemons [Benzonatate] Swelling and Other (See Comments)    Capsule opened in mouth, not an allergy  . Neosporin [Neomycin-Polymyxin-Gramicidin] Rash   Consultations:  Cardiology Dr. Kirk Ruths  Procedures/Studies: Dg Chest 2 View  Result Date: 07/30/2017 CLINICAL DATA:  Cough, weakness, coronary disease post MI, stage III chronic kidney disease, chronic systolic heart failure, hypertension, paroxysmal atrial fibrillation EXAM: CHEST  2 VIEW COMPARISON:  07/28/2017 FINDINGS: LEFT subclavian AICD leads project at RIGHT atrium, RIGHT ventricle and coronary sinus. Enlargement of cardiac silhouette with pulmonary vascular congestion. Mild interstitial infiltrates new since previous exam likely reflecting mild pulmonary edema. No gross pleural effusion or pneumothorax. Numerous EKG leads project over chest. No acute osseous findings. IMPRESSION: Mild CHF. Electronically Signed   By: Lavonia Dana M.D.   On: 07/30/2017 09:26   Dg Chest 2 View  Result Date: 07/28/2017 CLINICAL DATA:  Fatigue, urinary tract infection EXAM: CHEST  2 VIEW COMPARISON:  07/09/2017  FINDINGS: LEFT-sided pacemaker overlies stable enlarged cardiac silhouette. No effusion, infiltrate or pneumothorax. Degenerate spurring of the spine. Low lung volumes. IMPRESSION: Cardiomegaly and low lung volumes.  No acute findings Electronically Signed   By: Suzy Bouchard M.D.   On: 07/28/2017 12:27   Dg Chest 2 View  Result Date: 07/09/2017 CLINICAL DATA:  Confusion EXAM: CHEST  2 VIEW COMPARISON:  01/02/2017 FINDINGS: Biventricular ICD/pacer from the left with leads in stable position. Cardiomegaly is stable. Stable vascular pedicle widening accentuated by rotation. Low lung volumes with interstitial crowding. There is no edema, consolidation, effusion, or pneumothorax. IMPRESSION: 1. No acute finding when compared to prior. 2. Cardiomegaly with biventricular pacer. Electronically Signed   By: Monte Fantasia M.D.   On: 07/09/2017 14:24   Ct Head Wo Contrast  Result Date: 07/09/2017 CLINICAL DATA:  Acute onset confusion for 3 days. History of hypertension, atrial fibrillation. EXAM: CT HEAD WITHOUT CONTRAST TECHNIQUE: Contiguous axial images were obtained from the base of the skull through the vertex without intravenous contrast. COMPARISON:  CT HEAD Oct 25, 2015 FINDINGS: BRAIN: No intraparenchymal hemorrhage, mass effect nor midline shift. The ventricles and sulci are normal for age. Confluent supratentorial white matter hypodensities. Patchy focal hypodensities bilateral basal ganglia and thalami associated with chronic small vessel ischemic disease. Old LEFT basal ganglia lacunar infarct. Old RIGHT cerebellar small infarct. No acute large vascular territory infarcts. No abnormal extra-axial fluid collections. Basal cisterns are patent. VASCULAR: Mild calcific atherosclerosis of the carotid siphons and intradural vertebral artery's. SKULL: No skull fracture. No significant scalp soft tissue swelling. SINUSES/ORBITS: Trace paranasal sinus mucosal thickening and small RIGHT maxillary mucosal  retention cyst. Mastoid air cells are well aerated.The included ocular globes and orbital contents are non-suspicious. Status post bilateral ocular lens implants. OTHER: None. IMPRESSION: 1. No acute intracranial process. 2. Stable examination including moderate to severe chronic small vessel ischemic disease and old lacunar infarcts. 1. No acute Electronically Signed   By: Elon Alas M.D.   On: 07/09/2017 14:53   Dg Chest Port 1 View  Result Date: 08/02/2017 CLINICAL DATA:  Shortness of Breath EXAM: PORTABLE CHEST 1 VIEW COMPARISON:  August 01, 2017. FINDINGS: Pacemaker leads are attached the right atrium, right ventricle, and coronary sinus. No pneumothorax. There is cardiomegaly with pulmonary vascularity within normal limits. There is no evident edema or consolidation. No evident adenopathy. No bone lesions. IMPRESSION: Stable cardiomegaly. Pacemaker leads appear unchanged in position. No edema or consolidation. Electronically Signed   By: Lowella Grip III M.D.   On: 08/02/2017 07:43   Dg Chest Port 1 View  Result Date: 08/01/2017 CLINICAL DATA:  CHF, coronary artery disease and previous MI, respiratory failure. EXAM: PORTABLE CHEST 1 VIEW COMPARISON:  Chest x-ray of July 30, 2017 FINDINGS: The lungs are adequately inflated. The interstitial markings remain increased. There is no alveolar infiltrate or pleural effusion. The cardiac silhouette remains enlarged and the pulmonary vascularity engorged. There is no change in the position of the ICD. The bony thorax is unremarkable. IMPRESSION: CHF with mild interstitial edema, stable.  No acute pneumonia. Electronically Signed   By: David  Martinique M.D.   On: 08/01/2017 07:53     Subjective: Seen and examined this AM and was working with PT. Felt good and was alert and oriented. SOB much improved. No CP. Answered all question's to patient's and daughter's satisfaction and will D/C Home with Cardiology follow up.  Discharge Exam: Vitals:    08/02/17 0730 08/02/17 1115  BP:  113/67 (!) 105/53  Pulse: 90 68  Resp: 18 16  Temp: (!) 97.5 F (36.4 C) 98 F (36.7 C)  SpO2: 92% 94%   Vitals:   08/02/17 0322 08/02/17 0410 08/02/17 0730 08/02/17 1115  BP:  95/75 113/67 (!) 105/53  Pulse:  (!) 103 90 68  Resp:  18 18 16   Temp:  97.6 F (36.4 C) (!) 97.5 F (36.4 C) 98 F (36.7 C)  TempSrc:  Oral Oral Oral  SpO2:  93% 92% 94%  Weight: 97.8 kg (215 lb 9.8 oz)     Height:       General: Pt is alert, awake, not in acute distress Cardiovascular: RRR, S1/S2 +, no rubs, no gallops Respiratory: Diminished bilaterally, no wheezing, no rhonchi Abdominal: Soft, NT, Distended due to body habitus, bowel sounds + Extremities: trace edema, no cyanosis  The results of significant diagnostics from this hospitalization (including imaging, microbiology, ancillary and laboratory) are listed below for reference.    Microbiology: Recent Results (from the past 240 hour(s))  Urine culture     Status: None   Collection Time: 07/30/17  9:55 AM  Result Value Ref Range Status   Specimen Description URINE, CATHETERIZED  Final   Special Requests NONE  Final   Culture   Final    NO GROWTH Performed at [facility], [address], [place], [place] [address]    Report Status 07/31/2017 FINAL  Final    Labs: BNP (last 3 results) Recent Labs    01/01/17 1339 07/30/17 0842  BNP 975.5* 0,923.3*   Basic Metabolic Panel: Recent Labs  Lab 07/28/17 1102 07/30/17 0817 07/31/17 0517 08/01/17 0548 08/02/17 0519  NA 135 140 140 140 140  K 4.0 2.5* 3.1* 3.4* 3.4*  CL 101 98* 97* 95* 96*  CO2 25 26 26 30 28   GLUCOSE 117* 99 98 113* 104*  BUN 25* 24* 23* 24* 21*  CREATININE 1.10* 1.16* 1.03* 1.16* 1.13*  CALCIUM 9.0 9.2 9.4 9.5 9.5  MG  --  1.9  --  1.8 1.9  PHOS  --   --   --  4.0 3.7   Liver Function Tests: Recent Labs  Lab 07/28/17 1102 07/30/17 0817 08/01/17 0548 08/02/17 0519  AST 21 23 23 21   ALT 14 15 16 14    ALKPHOS 65 65 63 69  BILITOT 1.2 1.7* 1.4* 1.4*  PROT 7.2 6.5 6.6 6.7  ALBUMIN 4.1 3.8 3.5 3.7   No results for input(s): LIPASE, AMYLASE in the last 168 hours. Recent Labs  Lab 07/28/17 1403  AMMONIA 16   CBC: Recent Labs  Lab 07/28/17 1102 07/30/17 0838 07/31/17 1047 08/01/17 0548 08/02/17 0519  WBC 10.0 10.6* 8.9 8.7 8.1  NEUTROABS 6.3 7.6 6.2 5.1 4.3  HGB 12.2 12.6 13.3 12.7 13.4  HCT 36.7 39.4 41.2 39.3 42.3  MCV 96.1 97.8 98.3 97.5 98.4  PLT 196 210 226 234 249   Cardiac Enzymes: No results for input(s): CKTOTAL, CKMB, CKMBINDEX, TROPONINI in the last 168 hours. BNP: Invalid input(s): POCBNP CBG: Recent Labs  Lab 07/30/17 0927  GLUCAP 91   D-Dimer No results for input(s): DDIMER in the last 72 hours. Hgb A1c No results for input(s): HGBA1C in the last 72 hours. Lipid Profile No results for input(s): CHOL, HDL, LDLCALC, TRIG, CHOLHDL, LDLDIRECT in the last 72 hours. Thyroid function studies No results for input(s): TSH, T4TOTAL, T3FREE, THYROIDAB in the last 72 hours.  Invalid input(s): FREET3 Anemia work up No  results for input(s): VITAMINB12, FOLATE, FERRITIN, TIBC, IRON, RETICCTPCT in the last 72 hours. Urinalysis    Component Value Date/Time   COLORURINE STRAW (A) 07/30/2017 [place] 07/30/2017 0955   LABSPEC 1.006 07/30/2017 0955   PHURINE 6.0 07/30/2017 0955   GLUCOSEU NEGATIVE 07/30/2017 0955   HGBUR MODERATE (A) 07/30/2017 0955   BILIRUBINUR NEGATIVE 07/30/2017 0955   KETONESUR NEGATIVE 07/30/2017 0955   PROTEINUR NEGATIVE 07/30/2017 0955   UROBILINOGEN 0.2 11/21/2012 1750   NITRITE NEGATIVE 07/30/2017 0955   LEUKOCYTESUR NEGATIVE 07/30/2017 0955   Sepsis Labs Invalid input(s): PROCALCITONIN,  WBC,  LACTICIDVEN Microbiology Recent Results (from the past 240 hour(s))  Urine culture     Status: None   Collection Time: 07/30/17  9:55 AM  Result Value Ref Range Status   Specimen Description URINE, CATHETERIZED  Final    Special Requests NONE  Final   Culture   Final    NO GROWTH Performed at [facility], [place] [address], [place], [place] [address]    Report Status 07/31/2017 FINAL  Final   Time coordinating discharge: 35 minutes  SIGNED:  Kerney Elbe, DO [facility] 08/02/2017, 1:42 PM Pager [phone]  If 7PM-7AM, please contact night-coverage [email or web address] Password [email or web address]

## 2017-08-05 ENCOUNTER — Other Ambulatory Visit: Payer: Self-pay

## 2017-08-05 NOTE — Patient Outreach (Signed)
[facility]) Care Management  08/05/2017  Bianca [name] December 25, 1932 [id number]   Subjective: "She is doing much better. She is so much better she is downstairs playing cards right now".  Objective: none  Assessment: 84 year old with recent admission 2/12-2/15 with confusion, heart failure exacerbation, UTI. 3 Emergency room visits.  RNCM called to complete transition of care call. RNCM spoke with client's daughter in law, Bianca [name](on consent). Daughter in law reports that client is hard of hearing on the phone and she takes client's phone calls. She states that Client lives at [facility] (independent Living) and that she is not in her room now, but out socializing-playing cards.  She reports client is doing well, she states client is sleeping well, does not have any swelling of extremities. She states client weighs self every day. Daughter in law states she or her husband talks with client every morning to see what her weight is and they record client's weights.  Bianca adds that she fills clients pill box and has been filling her pill box every since client has moved to [facility]. She also reports that since client has discharged from the hospital that client's son has been going over to see client every morning to ensure client has taken her medications and someone goes over every evening to check on her. She states they are doing this for a few weeks to ensure that client has gotten back to her usual routines.  Per daughter in law, client has yeast infection to folds of skin under stomach and groin area that is improving.  Medications reviewed. Bianca states that she has not increased clients Nortriptyline because the increased dose caused too much sedation. Client is currently taking Nortriptyline 25mg  at night. She also states that she did not start Hydrazaline due to concern that it may drop her blood pressure too low. She states she called and spoke  with cardiologist who instructed her to not give the hydralazine and would follow up with client at next appointment.   Bianca denies any issues or concerns at this time. RNCM provided contact number and encouraged her to call as needed. Also instructed her to call 24 hour nurse advice line as needed and confirmed she has the contact number.  Plan: home visit next week. [facility] CM Care Plan Problem One     Most Recent Value  Care Plan Problem One  at risk for readmission as evidence by recent admission and multiple Emergency room visits.  Role Documenting the Problem One  Care Management [place] for Problem One  Active  [facility] Long Term Goal   client will not be readmitted within the next 31-45 days.  [facility] Long Term Goal Start Date  08/05/17  Interventions for Problem One Long Term Goal  RNCM completed transition of care call-completed with daughter in law(care giver), medications reviewed, upcoming appointments discussed,  [facility] CM Short Term Goal #1   client will attend follow up appointments as scheduled within the next 30 days.  [facility] CM Short Term Goal #1 Start Date  08/05/17  Interventions for Short Term Goal #1  reviewed upcoming appointments with daughter in law.  [facility] CM Short Term Goal #2   client/caregiver will verbalize signs/symptoms of UTI within the next 30 days.  [facility] CM Short Term Goal #2 Start Date  08/05/17  Interventions for Short Term Goal #2  reviewed client's historoy of having urinary tract infections, discussed briefly how quickly urinary tract infection  can affect you.  [facility] CM Short Term Goal #3  caregiver will reports continued improvement/healing in skin folds within the next 30 days.Margie Billet CM Short Term Goal #3 Start Date  08/05/17  Interventions for Short Tern Goal #3  discussed medications use       Thea Silversmith, RN, MSN, [place] Coordinator Cell: [phone]

## 2017-08-06 DIAGNOSIS — Z5181 Encounter for therapeutic drug level monitoring: Secondary | ICD-10-CM | POA: Diagnosis not present

## 2017-08-06 DIAGNOSIS — R399 Unspecified symptoms and signs involving the genitourinary system: Secondary | ICD-10-CM | POA: Diagnosis not present

## 2017-08-06 DIAGNOSIS — I5022 Chronic systolic (congestive) heart failure: Secondary | ICD-10-CM | POA: Diagnosis not present

## 2017-08-06 DIAGNOSIS — Z09 Encounter for follow-up examination after completed treatment for conditions other than malignant neoplasm: Secondary | ICD-10-CM | POA: Diagnosis not present

## 2017-08-07 DIAGNOSIS — R0602 Shortness of breath: Secondary | ICD-10-CM | POA: Diagnosis not present

## 2017-08-07 DIAGNOSIS — I5022 Chronic systolic (congestive) heart failure: Secondary | ICD-10-CM | POA: Diagnosis not present

## 2017-08-08 ENCOUNTER — Ambulatory Visit (INDEPENDENT_AMBULATORY_CARE_PROVIDER_SITE_OTHER): Payer: PPO

## 2017-08-08 DIAGNOSIS — I5022 Chronic systolic (congestive) heart failure: Secondary | ICD-10-CM | POA: Diagnosis not present

## 2017-08-08 DIAGNOSIS — Z95810 Presence of automatic (implantable) cardiac defibrillator: Secondary | ICD-10-CM | POA: Diagnosis not present

## 2017-08-09 NOTE — Progress Notes (Signed)
EPIC Encounter for ICM Monitoring  Patient Name: Bianca [name] is a 84 y.o. female Date: 08/09/2017 Primary Care Physican: Maurice Small, MD Primary Cardiologist:Skains/Laura Dorene Ar, NP Electrophysiologist: Druscilla Brownie Weight:Previous weight 210lbs   Bi-V Pacing: 92.6%            Attempted call to daughter in law, Sidnee Gambrill.   Left detailed message regarding transmission.  Transmission reviewed.    Thoracic impedance normal.  Prescribed dosage: Torsemide 20 mg 4 tablets (80 mg total) every morning and 2 tablets (40 mg total) six hours after morning dosage daily. Potassium 20 mEq 2 tablets twice a day. Metolazone 2.5 mg 1 tablet weekly on Monday.  Labs: 08/02/2017 Creatinine 1.13, BUN 21, Potassium 3.4, Sodium 140, EGFR 44-51 08/01/2017 Creatinine 1.16, BUN 24, Potassium 3.4, Sodium 140, EGFR 43-49  07/31/2017 Creatinine 1.03, BUN 23, Potassium 3.1, Sodium 140, EGFR 49-57  07/30/2017 Creatinine 1.16, BUN 24, Potassium 2.5, Sodium 140, EGFR 43-49  07/28/2017 Creatinine 1.10, BUN 25, Potassium 4.0, Sodium 135, EGFR 45-53  07/09/2017 Creatinine 1.08, BUN 23, Potassium 3.3, Sodium 140, EGFR 46-54  06/07/2017 Creatinine 0.98, BUN 27, Potassium 3.9, Sodium 140, EGFR 54-62  04/17/2017 Creatinine 1.23, BUN 27, Potassium 3.8, Sodium 139, EGFR 41-47  03/28/2017 Creatinine 1.29, BUN 44, Potassium 3.7, Sodium 139, EGFR 39-45  02/19/2017 Creatinine 1.20, BUN 36, Potassium 3.6, Sodium 137, EGFR 42-49 01/08/2017 Creatinine 1.01, BUN 17, Potassium 4.5, Sodium 142, EGFR 52-60  01/04/2017 Creatinine 1.09, BUN 16, Potassium 4.0, Sodium 139, EGFR 46-53  01/03/2017 Creatinine 1.13, BUN 18, Potassium 4.2, Sodium 142, EGFR 44-51  01/02/2017 Creatinine 1.17, BUN 18, Potassium 3.7, Sodium 141, EGFR 42-49  01/01/2017 Creatinine 1.10, BUN 17, Potassium 4.1, Sodium 140, EGFR 45-53  Recommendations: Left voice mail with ICM number and encouraged to call if experiencing any fluid  symptoms.  Follow-up plan: ICM clinic phone appointment on 09/10/2017.  Office appointment with [name], NP on 08/12/2017   Copy of ICM check sent to Dr. Lovena Le and [name], NP for review at office visit 08/12/2017 if needed  3 month ICM trend: 08/08/2017    1 Year ICM trend:       Rosalene Billings, RN 08/09/2017 9:18 AM

## 2017-08-12 ENCOUNTER — Ambulatory Visit: Payer: PPO | Admitting: Cardiology

## 2017-08-12 ENCOUNTER — Encounter: Payer: Self-pay | Admitting: Cardiology

## 2017-08-12 VITALS — BP 100/62 | HR 104 | Ht 66.0 in | Wt 220.1 lb

## 2017-08-12 DIAGNOSIS — I480 Paroxysmal atrial fibrillation: Secondary | ICD-10-CM

## 2017-08-12 DIAGNOSIS — I509 Heart failure, unspecified: Secondary | ICD-10-CM

## 2017-08-12 DIAGNOSIS — I5022 Chronic systolic (congestive) heart failure: Secondary | ICD-10-CM

## 2017-08-12 DIAGNOSIS — Z7901 Long term (current) use of anticoagulants: Secondary | ICD-10-CM | POA: Diagnosis not present

## 2017-08-12 DIAGNOSIS — Z95810 Presence of automatic (implantable) cardiac defibrillator: Secondary | ICD-10-CM | POA: Diagnosis not present

## 2017-08-12 DIAGNOSIS — I420 Dilated cardiomyopathy: Secondary | ICD-10-CM | POA: Diagnosis not present

## 2017-08-12 MED ORDER — METOLAZONE 2.5 MG PO TABS: ORAL_TABLET | ORAL | 0 refills | Status: DC

## 2017-08-12 MED ORDER — ISOSORBIDE MONONITRATE ER 30 MG PO TB24: 30.0000 mg | ORAL_TABLET | Freq: Every day | ORAL | 3 refills | Status: DC

## 2017-08-12 MED ORDER — ISOSORBIDE MONONITRATE ER 30 MG PO TB24: 15.0000 mg | ORAL_TABLET | Freq: Every day | ORAL | 3 refills | Status: DC

## 2017-08-12 NOTE — Patient Instructions (Addendum)
Medication Instructions:  Your physician has recommended you make the following change in your medication:  STOP: hydralazine   DECREASE: Imdur to 15 mg (0.5 tab) once a day   START: metolazone 2.5-5 mg once a week   Labwork: Today for basic metabolic panel   Testing/Procedures: None ordered   Follow-Up: Your physician is referring you to Dr. Aundra Dubin at the heart failure clinic  Any Other Special Instructions Will Be Listed Below (If Applicable).     If you need a refill on your cardiac medications before your next appointment, please call your pharmacy.

## 2017-08-12 NOTE — Progress Notes (Addendum)
Cardiology Office Note   Date:  08/12/2017   ID:  Cortne, Amara [birth year], MRN [id number]  PCP:  Maurice Small, MD  Cardiologist:  Dr. Marlou Porch    Chief Complaint  Patient presents with  . Congestive Heart Failure      History of Present Illness: Bianca [name] is a 84 y.o. female who presents for post hospital.  Admitted with AMS from UTI and systolic HF.  She has a hx of nonischemic cardiomyopathy, hx of atrial fibrillation-currently with chronic anticoagulation, hx of Chronic kidney disease-stage III, chronic systolic heart failure-status post Bi- ventricular ICD-followed by Dr. Lovena Le. No defibrillations. EF 35%. Symptoms have been very stable. NYHA class II except for hospitalization for heart failure she was discharged on 11/24/12 with primary diagnosis of acute on chronic systolic heart failure. She was admitted with fluid overload, shortness of breath and diuresed successfully.  Admitted July 2018 with HF exacerbation.   outpt stress test with EF 20%, and large defect of moderate severity but no reversible ischemia.  Cath 2008 with patent coronary arteries.  Her optivol readings have been good. And one sent last pm reveals a rise in optivol by Sat. and improvement after Monday  Her wt climbs with this.  She has been better controlled on addition of metolazone.   Follow up Echo 04/2017 with EF 20% with G2 DD, mod ME, LA was moderately severely dilated  On visit end of Dec Her optivol readings had been good and remained so.  Marland Kitchen  Recently  admitted  07/30/17  with AMS secondary to UTI and found to have acute CHF, systolic/diastolic.-- she had been seen in ER at [facility] for UTI and was kept most of day witout her meds being given.  No diuretic at all.  Then she was sent home.  Then with increased confusion admitted to [facility]. Less than 24 hours later.     Her K+ dropped to 2.5 with diuresis and on admit and at discharge was 3.4   Wt prior to discharge was 215 lbs and diuresed 2,212 ML.   Her dry wt is 210 lbs, which was wt when she saw Dr. Marlou Porch on 07/18/17.     At discharge she was placed on hydralazine though BP was 453 to 646 systolic, her daughter in law, who does the meds, called our office and we asked them to hold until seen.   Optivol 08/08/17 was normal thoracic impedance.   Thought was to change toprol to coreg but this made her very sleepy- I made as intolerance in chart.  Additionally pt had been on Bisoprolol 2.5 mg at bedtime, but [facility] no longer carries so it was changed to Toprol XL 25 mg at HS   BNP was down from 1664 on admit to 960 last week.    Today she is very tired, she did not sleep well last night, they stopped her melatonin and decreased the gabapentin.  This was due to AMS she had on admit.   Her HR is SR- reviewed with Dr. Marlou Porch with rate of 90 and paced ventricular.  No chest pain.  No SOB.  She does have some ankle edema.  Her daughter In law is concerned.  Her wt is 220 today with clothes and shoes on.  -at home 215 lbs.Her metolazone was held in the hospital she did take today 2.5 mg and she is on Torsemide 80 mg every AM and 40 mg six hours after the morning dose.  Her BP  is borderline.  Her appetite is decreased as well.      Past Medical History:  Diagnosis Date  . Acute blood loss anemia   . Arthritis   . CAD (coronary artery disease)/LBBB 01/01/2017  . Chronic systolic heart failure ([place])   . CKD (chronic kidney disease), stage III ([place]) 12/22/2015  . Depression 11/24/2015  . Hypertension   . Hypokalemia   . Myocardial infarction ([place]) 2008  . Neuromuscular disorder ([place])   . Paroxysmal atrial fibrillation ([facility])   . Physical deconditioning   . Right flank hematoma   . Sleep apnea     Past Surgical History:  Procedure Laterality Date  . ABDOMINAL HYSTERECTOMY    . ANKLE SURGERY    . CARDIAC CATHETERIZATION    . CHOLECYSTECTOMY    . EP IMPLANTABLE DEVICE N/A 02/29/2016   Procedure: BIV ICD Generator Changeout;  Surgeon: Evans Lance,  MD;  Location: [facility];  Service: Cardiovascular;  Laterality: N/A;  . EYE SURGERY    . INSERT / REPLACE / REMOVE PACEMAKER  2008  . KNEE LIGAMENT RECONSTRUCTION  2012  . TONSILLECTOMY       Current Outpatient Medications  Medication Sig Dispense Refill  . acetaminophen (TYLENOL) 325 MG tablet Take 325-650 mg by mouth 2 (two) times daily as needed for mild pain or headache.     . allopurinol (ZYLOPRIM) 100 MG tablet Take 100 mg by mouth See admin instructions. Takes along with a 300 mg tablet to equal 400 mg     . allopurinol (ZYLOPRIM) 300 MG tablet Take 300 mg by mouth See admin instructions. Take with the 100mg  tablet to equal 400mg  daily     . apixaban (ELIQUIS) 5 MG TABS tablet Take 5 mg by mouth 2 (two) times daily.    Marland Kitchen atorvastatin (LIPITOR) 10 MG tablet TAKE 1 TABLET (10 MG TOTAL) BY MOUTH DAILY AT 6 PM. 90 tablet 3  . Calcium Carbonate-Vit D-Min (CALCIUM/VITAMIN D/MINERALS) 600-200 MG-UNIT TABS Take 1 tablet by mouth 2 (two) times daily.     . Cholecalciferol (VITAMIN D3) 1000 UNITS CAPS Take 1,000 Units by mouth daily.     . colestipol (COLESTID) 1 G tablet Take 2 g by mouth at bedtime.     . gabapentin (NEURONTIN) 400 MG capsule Take 1 capsule (400 mg total) by mouth 3 (three) times daily. 90 capsule 0  . guaiFENesin (MUCINEX) 600 MG 12 hr tablet Take 1 tablet (600 mg total) by mouth 2 (two) times daily.    Marland Kitchen KLOR-CON M20 20 MEQ tablet TAKE 2 TABLETS (40 MEQ TOTAL) BY MOUTH 2 (TWO) TIMES DAILY. 360 tablet 3  . metoprolol succinate (TOPROL-XL) 25 MG 24 hr tablet Take 1 tablet (25 mg total) by mouth daily. 90 tablet 3  . nitroGLYCERIN (NITROSTAT) 0.4 MG SL tablet Place 1 tablet (0.4 mg total) under the tongue every 5 (five) minutes as needed for chest pain. 25 tablet 6  . nortriptyline (PAMELOR) 25 MG capsule Take 25 mg by mouth at bedtime.    . OXYGEN Inhale 2 L/min into the lungs at bedtime as needed (uses as bedtime everynight and then occasionally iun the daytime as  needed for SOB).     . Probiotic Product (PROBIOTIC PO) Take 1 tablet by mouth daily.    Marland Kitchen senna-docusate (SENOKOT-S) 8.6-50 MG tablet Take 1 tablet by mouth 2 (two) times daily.     Marland Kitchen spironolactone (ALDACTONE) 25 MG tablet Take 0.5 tablets (12.5 mg total) by  mouth daily. 30 tablet 11  . torsemide (DEMADEX) 20 MG tablet Take 4 tablets (80 mg total) every morning and 2 tablets (40 mg total) six hours after morning dosage daily. (Patient taking differently: Take 20-80 mg by mouth See admin instructions. Take 4 tablets (80 mg total) every morning and 1 tablet (20 mg total) at 4pm on Tuesday, Thursday, and Saturday ONLY) 540 tablet 3  . isosorbide mononitrate (IMDUR) 30 MG 24 hr tablet Take 0.5 tablets (15 mg total) by mouth daily. 45 tablet 3  . metolazone (ZAROXOLYN) 2.5 MG tablet Take 2.5-5 mg every Monday 8 tablet 0   No current facility-administered medications for this visit.     Allergies:   Quinapril hcl; Tape; Coreg [carvedilol]; Tessalon [benzonatate]; and Neosporin [neomycin-polymyxin-gramicidin]    Social History:  The patient  reports that she has quit smoking. She has quit using smokeless tobacco. She reports that she does not drink alcohol or use drugs.   Family History:  The patient's family history includes Cancer in her brother; Congestive Heart Failure in her mother; Diabetes in her brother; Heart attack in her son; Stroke in her brother and mother; Tuberculosis in her father.    ROS:  General:no colds or fevers, no weight changes at home she was 215 today, same as discharge. Skin:+ rashes abd, yeast   or ulcers HEENT:no blurred vision, no congestion CV:see HPI PUL:see HPI GI:no diarrhea constipation or melena, no indigestion GU:no hematuria, no dysuria MS:no joint pain, no claudication Neuro:no syncope, no lightheadedness Endo:no diabetes, no thyroid disease  Wt Readings from Last 3 Encounters:  08/12/17 220 lb 1.9 oz (99.8 kg)  08/02/17 215 lb 9.8 oz (97.8 kg)    07/28/17 200 lb (90.7 kg)     PHYSICAL EXAM: VS:  BP 100/62   Pulse (!) 104   Ht 5\' 6"  (1.676 m)   Wt 220 lb 1.9 oz (99.8 kg)   LMP  (LMP Unknown)   SpO2 93%   BMI 35.53 kg/m  , BMI Body mass index is 35.53 kg/m. General:Pleasant affect, NAD- but obviously fatigued.  Skin:Warm and dry, brisk capillary refill HEENT:normocephalic, sclera clear, mucus membranes moist Neck:supple, no JVD sitting up , she could not climb up on table., no bruits  Heart:S1S2 RRR without murmur, gallup, rub or click Lungs:clear without rales, rhonchi, or wheezes NUU:VOZD, non tender, + BS, do not palpate liver spleen or masses Ext:1+ lower ext edema, 1+ pedal pulses, 2+ radial pulses Neuro:alert and oriented X 3, MAE, follows commands, + facial symmetry    EKG:  EKG is ordered today. The ekg ordered today demonstrates SR with V pacing reviewed with Dr. Marlou Porch.   Recent Labs: 07/30/2017: B Natriuretic Peptide 1,664.3 08/02/2017: ALT 14; BUN 21; Creatinine, Ser 1.13; Hemoglobin 13.4; Magnesium 1.9; Platelets 249; Potassium 3.4; Sodium 140    Lipid Panel    Component Value Date/Time   CHOL 158 07/20/2014 1149   TRIG 332.0 (H) 07/20/2014 1149   HDL 38.60 (L) 07/20/2014 1149   CHOLHDL 4 07/20/2014 1149   VLDL 66.4 (H) 07/20/2014 1149   LDLCALC 73 09/30/2013 0948   LDLDIRECT 80.0 07/20/2014 1149       Other studies Reviewed: Additional studies/ records that were reviewed today include:  ECHO 05/01/17  Study Conclusions  - Left ventricle: The cavity size was severely dilated. Wall   thickness was normal. The estimated ejection fraction was 20%.   Diffuse hypokinesis. Features are consistent with a pseudonormal   left ventricular filling pattern, with  concomitant abnormal   relaxation and increased filling pressure (grade 2 diastolic   dysfunction). E/medial e&' > 15 suggests LV end diastolic pressure   at least 20 mmHg. - Aortic valve: There was no stenosis. - Mitral valve: Moderately  calcified annulus. There was moderate   central regurgitation. - Left atrium: The atrium was moderately to severely dilated. - Right ventricle: The cavity size was normal. Pacer wire or   catheter noted in right ventricle. Systolic function was mildly   reduced. - Tricuspid valve: Peak RV-RA gradient (S): 24 mm Hg. - Pulmonary arteries: PA peak pressure: 27 mm Hg (S). - Inferior vena cava: The vessel was normal in size. The   respirophasic diameter changes were in the normal range (= 50%),   consistent with normal central venous pressure.  Impressions:  - Severely dilated LV with severe diffuse hypokinesis, EF 20%.   Moderate diastolic dysfunction with evidence for elevated LV   filling pressure. Normal RV size with mildly decreased systolic   function. Moderate central mitral regurgitation, likely   functional MR.  Dionicia Abler 01/10/17   Study Highlights     The left ventricular ejection fraction is severely decreased (<30%).  Nuclear stress EF: 20%.  There was no ST segment deviation noted during stress.  Defect 1: There is a large defect of moderate severity present in the basal inferior, basal inferolateral, mid inferior, mid inferolateral and apical inferior location.  This is a high risk study.  Findings consistent with prior myocardial infarction.   High risk study due to the presence of a large inferolateral scar and severely depressed overall left ventricular systolic function. There is no meaningful reversible ischemia identified.   ECHO 01/02/17  Study Conclusions  - Left ventricle: The cavity size was severely dilated. There was   moderate concentric hypertrophy. Systolic function was severely   reduced. The estimated ejection fraction was in the range of 20%   to 25%. Diffuse hypokinesis. Features are consistent with a   pseudonormal left ventricular filling pattern, with concomitant   abnormal relaxation and increased filling pressure (grade 2    diastolic dysfunction). Doppler parameters are consistent with   elevated ventricular end-diastolic filling pressure. - Ventricular septum: Septal motion showed paradox. - Mitral valve: Calcified annulus. There was moderate   regurgitation. - Left atrium: The atrium was severely dilated. - Right ventricle: Systolic function was normal. - Right atrium: The atrium was normal in size. - Tricuspid valve: There was moderate regurgitation. - Pulmonic valve: There was no regurgitation. - Pulmonary arteries: Systolic pressure was moderately increased.   PA peak pressure: 46 mm Hg (S). - Pericardium, extracardiac: There was no pericardial effusion.  Impressions:  - When compared to the prior study from 10/26/2015, LVEF has   decreased from 50-55% to 20-25%.   Filling pressures are significantly elevated.   Moderate MR and TR.   Moderate pulmonary hypertension.    ASSESSMENT AND PLAN:  1.  Acute systolic/diastolic HF with EF 80% and EF 20-25% will add back the metolazone at 2.5 today which she took and then this week another 2.5 on Thursday.  Continue current dose of torsemide.  Will check BMP today.  They would like to be seen in HF clinic with Dr. Aundra Dubin again.  Maybe we can send for consult then he can refer back.  Recent optivol next week was normal. Will check BMP today.  Will ask Dr. Aundra Dubin to send back when appropriate. Decrease Imdur to 15 mg daily to help BP.  2.  NICM - recent HF.  Improving but very weak  + BiV ICD followed by EP  3.  PAF  On Eliquis, no bleeding.  Today SR with paced beats.    4.  OSA using CPAP  5.  AMS improved but with no sleep last PM very tired today.    Current medicines are reviewed with the patient today.  The patient Has no concerns regarding medicines.  The following changes have been made:  See above Labs/ tests ordered today include:see above  Disposition:   FU:  see above  Signed, [name], NP  08/12/2017 4:57 PM    [facility], [place], [place], [place] Phone: [phone]; Fax: [phone]

## 2017-08-13 LAB — BASIC METABOLIC PANEL
BUN/Creatinine Ratio: 22 (ref 12–28)
BUN: 21 mg/dL (ref 8–27)
CALCIUM: 10 mg/dL (ref 8.7–10.3)
CHLORIDE: 96 mmol/L (ref 96–106)
CO2: 26 mmol/L (ref 20–29)
CREATININE: 0.97 mg/dL (ref 0.57–1.00)
GFR calc Af Amer: 63 mL/min/{1.73_m2} (ref >59)
GFR calc non Af Amer: 55 mL/min/{1.73_m2} — ABNORMAL LOW (ref >59)
GLUCOSE: 130 mg/dL — AB (ref 65–99)
Potassium: 3.4 mmol/L — ABNORMAL LOW (ref 3.5–5.2)
Sodium: 142 mmol/L (ref 134–144)

## 2017-08-14 ENCOUNTER — Other Ambulatory Visit: Payer: Self-pay | Admitting: Pharmacist

## 2017-08-14 ENCOUNTER — Other Ambulatory Visit: Payer: Self-pay

## 2017-08-14 NOTE — Patient Outreach (Signed)
[facility]) Care Management  08/14/17 Bianca [name] [birth year]   Bianca [name] is a 84 year old female active in [place] registry noted to have been recently hospitalized 07/30/17 for acute on chronic combined systolic and diastolic heart failure. PMHx includes, but not limited to, OSA on CPAP; afib on Eliquis; CAD; combined chronic heart failure, HTN and stage 3 CKD.    [facility] pharmacy referred for 30 day post discharge medication reconciliation. Called and spoke with patient's daughter-in-law and caregiver, Henry Demeritt (on consent), to complete this medication reconciliation. Review with caregiver each medication including name, dose, indication and administration. [name] reports that the patient takes her medication as directed, denies any missed doses at this time. Reports that she and her husband maintain the weekly pillboxes for Mrs. Royce. Confirms that patient is no longer taking bisoprolol, Tylenol PM or melatonin as directed at discharge. Confirms that patient is now taking reduced dose of gabapentin as directed and taking metoprolol as directed. Denies patient having dizziness or sedation with this dose of gabapentin or with nortriptyline 25 mg nightly at bedtime, but does note that doses of nortriptyline higher than 25 mg/day have caused the patient significant sedation in the past.  Note that patient has a past severe reaction to an angiotensin-converting enzyme (ACE) inhibitor.  Reports that the patient has been referred to be seen at the Heart Failure clinic and has an appointment on 08/19/17.   ASSESSMENT: Date Discharged from Hospital: 08/02/17 Date Medication Reconciliation Performed: 08/14/17  Patient was recently discharged from hospital and all medications have been reviewed.  Medications Discontinued at Discharge:  Bisoprolol  Tylenol PM  Gabapentin 800 mg (replaced by gabapentin 400 mg)  Melatonin  New Medications at Discharge:    cephALEXin 500 MG capsule Commonly known as:  KEFLEX Take 1 capsule (500 mg total) by mouth 3 (three) times daily for 4 days.  gabapentin 400 MG capsule Commonly known as:  NEURONTIN Take 1 capsule (400 mg total) by mouth 3 (three) times daily. Replaces:  gabapentin 800 MG tablet  hydrALAZINE 10 MG tablet Commonly known as:  APRESOLINE Take 1 tablet (10 mg total) by mouth 3 (three) times daily.  metoprolol succinate 25 MG 24 hr tablet Commonly known as:  TOPROL-XL Take 1 tablet (25 mg total) by mouth daily.  Current Medications     acetaminophen 325 MG tablet Commonly known as:  TYLENOL Take 325-650 mg by mouth 2 (two) times daily as needed for mild pain or headache.   allopurinol 300 MG tablet Commonly known as:  ZYLOPRIM Take 300 mg by mouth See admin instructions. Take with the 122m tablet to equal 4096mdaily   allopurinol 100 MG tablet Commonly known as:  ZYLOPRIM Take 100 mg by mouth See admin instructions. Takes along with a 300 mg tablet to equal 400 mg   atorvastatin 10 MG tablet Commonly known as:  LIPITOR TAKE 1 TABLET (10 MG TOTAL) BY MOUTH DAILY AT 6 PM.   CALCIUM/VITAMIN D/MINERALS 600-200 MG-UNIT Tabs Take 1 tablet by mouth 2 (two) times daily.   colestipol 1 g tablet Commonly known as:  COLESTID Take 2 g by mouth at bedtime.   ELIQUIS 5 MG Tabs tablet Generic drug:  apixaban Take 5 mg by mouth 2 (two) times daily.   gabapentin 400 MG capsule Commonly known as:  NEURONTIN Take 1 capsule (400 mg total) by mouth 3 (three) times daily.   guaiFENesin 600 MG 12 hr tablet Commonly known as:  MUCINEX Take 1 tablet (600 mg total) by mouth 2 (two) times daily.   isosorbide mononitrate 30 MG 24 hr tablet Commonly known as:  IMDUR Take 0.5 tablets (15 mg total) by mouth daily.   KLOR-CON M20 20 MEQ tablet Generic drug:  potassium chloride SA TAKE 2 TABLETS (40 MEQ TOTAL) BY MOUTH 2 (TWO) TIMES DAILY.   metolazone 2.5 MG tablet Commonly known  as:  ZAROXOLYN Take 2.5-5 mg every Monday   metoprolol succinate 25 MG 24 hr tablet Commonly known as:  TOPROL-XL Take 1 tablet (25 mg total) by mouth daily.   nitroGLYCERIN 0.4 MG SL tablet Commonly known as:  NITROSTAT Place 1 tablet (0.4 mg total) under the tongue every 5 (five) minutes as needed for chest pain.   nortriptyline 25 MG capsule Commonly known as:  PAMELOR Take 25 mg by mouth at bedtime.   OXYGEN Inhale 2 L/min into the lungs at bedtime as needed (uses as bedtime everynight and then occasionally iun the daytime as needed for SOB).   PROBIOTIC PO Take 1 tablet by mouth daily.   senna-docusate 8.6-50 MG tablet Commonly known as:  Senokot-S Take 1 tablet by mouth 2 (two) times daily.   spironolactone 25 MG tablet Commonly known as:  ALDACTONE Take 0.5 tablets (12.5 mg total) by mouth daily.   torsemide 20 MG tablet Commonly known as:  DEMADEX Take 4 tablets (80 mg total) every morning and 2 tablets (40 mg total) six hours after morning dosage daily.   Vitamin D3 1000 units Caps Take 1,000 Units by mouth daily.        Drugs sorted by system:  Cardiovascular: Eliquis, atorvastatin, colestipol, Imdur, metolazone, metoprolol ER, Nitrostat, spironolactone, torsemide  Pulmonary/Allergy: guaifenesin  Gastrointestinal: Senokot  Pain: acetaminophen, gabapentin, nortriptyline  Vitamins/Minerals: Calcium + Vitamin D, cholecalciferol, Klor-con  Miscellaneous: allopurinol, Probiotic   Duplications in therapy: none noted Medications to avoid in the elderly: gabapentin, nortriptyline - caregiver counseled about risk of dizziness and sedation. Denies patient having these side effects. Will send message to PCP with this note.  Drug interactions: Rebeca Allegra + nortriptyline: Anticholinergic agents, such as nortriptyline, may enhance the ulcerogenic effect of potassium. Note that per caregiver, patient does have difficulty with chronic dry mouth. Reports that  patient currently takes her potassium with applesauce to help the tablets go down. Counsel regarding risks of gastric and intestinal irritation and ulceration. Discuss with caregiver the potential use of a potassium sprinkle capsule as an alternative to reduce this risk.  . Colestid + cholecalciferol/metolazone/torsemide: Bile Acid Sequestrants may decrease the serum concentration of cholecalciferol, metolazone and torsemide. Caregiver states that patient takes the Colestid at bedtime and does not take any of these three medications within 3 hours of the Colestid.  Other issues noted:  Marland Kitchen Caregiver reports that she is not sure of expiration date of Nitrostat tablets . Need for Eliquis medication assistance when patient meets patient assistance program out of pocket spend requirement.   Plan: Medications reviewed with patient's daughter-in-law and caregiver, Keishana Klinger.  All questions answered.   1) Will send a message to patient's Cardiologist (prescriber) regarding risks of gastric and intestinal irritation and ulceration with solid tablet dosage form of potassium while also taking nortriptyline and caregiver reporting patient to have chronic dry mouth. Will suggest provider consider a sprinkle capsule dosage form to reduce this risk.  2)  Patient's daughter-in-law to check expiration date of patient's Nitrostat and, if expired, to contact provider for a new prescription.  3)  Patient's daughter-in-law to give me a call when patient has met the calendar year expenditure requirement for the Eliquis patient assistance program so that we can assist the patient/caregiver with this paperwork.  4) I will route my note to PCP and close patient case.   Harlow Asa, PharmD, [facility] [phone]

## 2017-08-14 NOTE — Patient Outreach (Signed)
Bianca [name]) Care Management   08/14/2017  Bianca [name] 04-Jul-1932 [id number]  Bianca [name] is an 84 y.o. female  Subjective: client reports she is very active at her assisted living.  Objective:  BP 100/70   Pulse 89   Resp 20   Ht 1.727 m (5\' 8" )   Wt 209 lb (94.8 kg)   LMP  (LMP Unknown)   SpO2 96%   BMI 31.78 kg/m   Review of Systems  Respiratory:       Lungs clear bilaterally  Cardiovascular:       S1S2 noted, regular, 1+ edema bilaterraly    Physical Exam color within normal limits, ambuating with Rolator walker.  Encounter Medications:   Outpatient Encounter Medications as of 08/14/2017  Medication Sig Note  . acetaminophen (TYLENOL) 325 MG tablet Take 325-650 mg by mouth 2 (two) times daily as needed for mild pain or headache.    . allopurinol (ZYLOPRIM) 100 MG tablet Take 100 mg by mouth See admin instructions. Takes along with a 300 mg tablet to equal 400 mg    . allopurinol (ZYLOPRIM) 300 MG tablet Take 300 mg by mouth See admin instructions. Take with the 100mg  tablet to equal 400mg  daily    . apixaban (ELIQUIS) 5 MG TABS tablet Take 5 mg by mouth 2 (two) times daily.   Marland Kitchen atorvastatin (LIPITOR) 10 MG tablet TAKE 1 TABLET (10 MG TOTAL) BY MOUTH DAILY AT 6 PM.   . Calcium Carbonate-Vit D-Min (CALCIUM/VITAMIN D/MINERALS) 600-200 MG-UNIT TABS Take 1 tablet by mouth 2 (two) times daily.    . Cholecalciferol (VITAMIN D3) 1000 UNITS CAPS Take 1,000 Units by mouth daily.    . colestipol (COLESTID) 1 G tablet Take 2 g by mouth at bedtime.    . gabapentin (NEURONTIN) 400 MG capsule Take 1 capsule (400 mg total) by mouth 3 (three) times daily.   Marland Kitchen guaiFENesin (MUCINEX) 600 MG 12 hr tablet Take 1 tablet (600 mg total) by mouth 2 (two) times daily.   . isosorbide mononitrate (IMDUR) 30 MG 24 hr tablet Take 0.5 tablets (15 mg total) by mouth daily.   Marland Kitchen KLOR-CON M20 20 MEQ tablet TAKE 2 TABLETS (40 MEQ TOTAL) BY MOUTH 2 (TWO) TIMES DAILY.   .  metolazone (ZAROXOLYN) 2.5 MG tablet Take 2.5-5 mg every Monday   . metoprolol succinate (TOPROL-XL) 25 MG 24 hr tablet Take 1 tablet (25 mg total) by mouth daily. 07/30/2017: Pt unsure of taking this med - LF 1/25 for 90 DS from [facility]  . nitroGLYCERIN (NITROSTAT) 0.4 MG SL tablet Place 1 tablet (0.4 mg total) under the tongue every 5 (five) minutes as needed for chest pain.   . nortriptyline (PAMELOR) 25 MG capsule Take 25 mg by mouth at bedtime.   . OXYGEN Inhale 2 L/min into the lungs at bedtime as needed (uses as bedtime everynight and then occasionally iun the daytime as needed for SOB).    . Probiotic Product (PROBIOTIC PO) Take 1 tablet by mouth daily.   Marland Kitchen senna-docusate (SENOKOT-S) 8.6-50 MG tablet Take 1 tablet by mouth 2 (two) times daily.    Marland Kitchen spironolactone (ALDACTONE) 25 MG tablet Take 0.5 tablets (12.5 mg total) by mouth daily.   Marland Kitchen torsemide (DEMADEX) 20 MG tablet Take 4 tablets (80 mg total) every morning and 2 tablets (40 mg total) six hours after morning dosage daily. (Patient taking differently: Take 20-80 mg by mouth See admin instructions. Take 4 tablets (80 mg total) every  morning and 1 tablet (20 mg total) at 4pm on Tuesday, Thursday, and Saturday ONLY)    No facility-administered encounter medications on file as of 08/14/2017.     Functional Status:   In your present state of health, do you have any difficulty performing the following activities: 08/14/2017 07/30/2017  Hearing? Tempie Donning  Vision? N N  Difficulty concentrating or making decisions? Y Y  Comment concentrating sometimes. -  Walking or climbing stairs? Y Y  Dressing or bathing? N Y  Doing errands, shopping? Y N  Preparing Food and eating ? N -  Using the Toilet? N -  In the past six months, have you accidently leaked urine? Y -  Do you have problems with loss of bowel control? Y -  Managing your Medications? N -  Comment daughter assist with medication management. -  Managing your Finances? N -  Comment "my son  takes care of my finance" -  Housekeeping or managing your Housekeeping? N -  Some recent data might be hidden    Fall/Depression Screening:    Fall Risk  08/14/2017  Falls in the past year? Yes  Number falls in past yr: 2 or more  Injury with Fall? No  Risk Factor Category  High Fall Risk  Risk for fall due to : History of fall(s);Impaired balance/gait  Follow up Falls prevention discussed   PHQ 2/9 Scores 08/14/2017  PHQ - 2 Score 1    Assessment:  84 year old with recent admission 2/12-2/15 with confusion, heart failure exacerbation, UTI. 1 hospitalization and 3 Emergency room visits in six months.   Home visit completed. Medications reviewed with client. Daughter in law [place] fills client's medication box. No issues or concerns noted at this time.  Fall risk-client ambulates with rolator walker. Client reports she has had previous falls, the last fall less than a year ago. She reports having a problem with her balance. Fall prevention strategies discussed. Client states she has was going to have some physical therapy last year, but it was not covered with her payor plan.   RNCM called customer service of client's payor plan regarding her benefits. Per representative, Aaron Edelman, client has an in-network benefit of zero co-copay, However it requires authorization. And an Out-of-network co-pay of $50/per visit. Aaron Edelman with customer service states the in network providers for [employer] is: [place]; [place] home [facility]: [facility]; and [facility]. Representative to send via mail to client a Book of her benefits. Daughter in law called during visit and discussed client's history of fall adding client's walker has been changed since last fall and she voiced neuropathy in feet/legs also contributing to her fall.    Plan: update primary care, telephonic follow up next week.  THN CM Care Plan Problem One     Most Recent Value  Care Plan Problem One  at risk for  readmission as evidence by recent admission and multiple Emergency room visits.  Role Documenting the Problem One  Care Management [place] for Problem One  Active  [name] Long Term Goal   client will not be readmitted within the next 31-45 days.  THN Long Term Goal Start Date  08/05/17  Interventions for Problem One Long Term Goal  reviewed heart failure action plan provided [facility] calender organizer and explained how to use.  THN CM Short Term Goal #1   client will attend follow up appointments as scheduled within the next 30 days.  THN  CM Short Term Goal #1 Start Date  08/05/17  Interventions for Short Term Goal #1  upcoming appointments discussed   THN CM Short Term Goal #2   client/caregiver will verbalize signs/symptoms of UTI within the next 30 days.  THN CM Short Term Goal #2 Start Date  08/05/17  Interventions for Short Term Goal #2  reviewed client's historoy of having urinary tract infections, discussed briefly how quickly urinary tract infection can affect you.  THN CM Short Term Goal #3  caregiver will reports continued improvement/healing in skin folds within the next 30 days.Margie Billet CM Short Term Goal #3 Start Date  08/05/17  Interventions for Short Tern Goal #3  discussed medications use   THN CM Short Term Goal #4  client will verbalize at least 3 fall prevention strategies within the next 30 days.  THN CM Short Term Goal #4 Start Date  08/14/17  Interventions for Short Term Goal #4  discussed importance of using walker at all time, [name] called payor with client to discuss benefit for home health physical therapy.    THN CM Care Plan Problem Two     Most Recent Value  Care Plan Problem Two  knowledge deficit regarding heart failure management.  Role Documenting the Problem Two  Care Management Coordinator  Care Plan for Problem Two  Active  Interventions for Problem Two Long Term Goal   provided heart failure zone tool, reviewed zones and actions associated with each  zone.  [place] Term Goal  client will verbalize at least three strategies to management heart failure within the next 31-60 days.  [place] Long Term Goal Start Date  08/14/17     Thea Silversmith, RN, MSN, [place] Coordinator Cell: [phone]

## 2017-08-16 ENCOUNTER — Other Ambulatory Visit: Payer: Self-pay | Admitting: Cardiology

## 2017-08-16 ENCOUNTER — Telehealth: Payer: Self-pay | Admitting: *Deleted

## 2017-08-16 NOTE — Telephone Encounter (Signed)
-----   Message from Isaiah Serge, NP sent at 08/16/2017  3:38 PM EST ----- Can you let Ms. Kosanke know that her potassium should be changed to go with another of her meds to prevent interactions.  Stop Kdur and change to potassium chloride 40 meq/15 ml, (20%) po solution,  She will take 15 ml twice a day to equal 40 meq BID.  She can mix in juice or water.   If you will put in order as well.  If any problems text me,  Thanks.      ----- Message ----- From: Leeroy Bock, [facility] Sent: 08/16/2017   1:47 PM To: Isaiah Serge, NP  Klor-con sprinkle 10 meq is the name in [place], but I only see an 22meq and 46meq dosing for the sprinkle formulation which might be a high pill burden since pt is taking 34meq BID. It might be easier to use the solution instead since it comes in a 82meq/15mL formulation. In Epic, this pulls under potassium chloride 40 meq/62mL (20%) po soln.  Thanks, Jinny Blossom   ----- Message ----- From: Isaiah Serge, NP Sent: 08/16/2017  12:39 PM To: Megan E Supple, RPH  Please see note from [facility],  I was going to change but I could not pull up potassium sprinkles - could you help me with the correct name.  Thanks. Mickel Baas  ----- Message ----- From: Vella Raring, [facility] Sent: 08/16/2017  10:01 AM To: Isaiah Serge, NP

## 2017-08-16 NOTE — Telephone Encounter (Signed)
Spoke with daughter in law, [name], and went over recommendations.  [name] states that [facility] came out the other day and then had a RPH reach out to them.  The [facility] had made a recommendation about switching pt to sprinkles for ease of swallowing.  [name] states that pt has no trouble swallowing her Klor Con and would do better with pills than liquid.  Advised I would send this information over to [name], NP to make her aware and see if any further recommendations.

## 2017-08-18 NOTE — Telephone Encounter (Signed)
The sprinkles only come in 10 meq,  I talked to [place] our PharmD and for amount pt takes the solutions would be answer,  Also the pharmacist from [facility] sent me a note concerned about tablets of KDUR interacting with one her her meds so that is why it is changed.

## 2017-08-19 ENCOUNTER — Other Ambulatory Visit (HOSPITAL_COMMUNITY): Payer: Self-pay | Admitting: *Deleted

## 2017-08-19 ENCOUNTER — Encounter (HOSPITAL_COMMUNITY): Payer: Self-pay

## 2017-08-19 ENCOUNTER — Ambulatory Visit (HOSPITAL_COMMUNITY)
Admission: RE | Admit: 2017-08-19 | Discharge: 2017-08-19 | Disposition: A | Discharge status: Home / Self Care | Payer: PPO | Source: Ambulatory Visit | Attending: Cardiology | Admitting: Cardiology

## 2017-08-19 VITALS — BP 106/72 | HR 98 | Wt 220.5 lb

## 2017-08-19 DIAGNOSIS — M1990 Unspecified osteoarthritis, unspecified site: Secondary | ICD-10-CM | POA: Diagnosis not present

## 2017-08-19 DIAGNOSIS — N183 Chronic kidney disease, stage 3 unspecified: Secondary | ICD-10-CM

## 2017-08-19 DIAGNOSIS — I130 Hypertensive heart and chronic kidney disease with heart failure and stage 1 through stage 4 chronic kidney disease, or unspecified chronic kidney disease: Secondary | ICD-10-CM | POA: Diagnosis not present

## 2017-08-19 DIAGNOSIS — E876 Hypokalemia: Secondary | ICD-10-CM | POA: Diagnosis not present

## 2017-08-19 DIAGNOSIS — Z79899 Other long term (current) drug therapy: Secondary | ICD-10-CM | POA: Insufficient documentation

## 2017-08-19 DIAGNOSIS — I5022 Chronic systolic (congestive) heart failure: Secondary | ICD-10-CM

## 2017-08-19 DIAGNOSIS — I429 Cardiomyopathy, unspecified: Secondary | ICD-10-CM | POA: Diagnosis present

## 2017-08-19 DIAGNOSIS — G4730 Sleep apnea, unspecified: Secondary | ICD-10-CM | POA: Insufficient documentation

## 2017-08-19 DIAGNOSIS — I480 Paroxysmal atrial fibrillation: Secondary | ICD-10-CM

## 2017-08-19 DIAGNOSIS — S301XXA Contusion of abdominal wall, initial encounter: Secondary | ICD-10-CM | POA: Insufficient documentation

## 2017-08-19 DIAGNOSIS — X58XXXA Exposure to other specified factors, initial encounter: Secondary | ICD-10-CM | POA: Diagnosis not present

## 2017-08-19 DIAGNOSIS — I472 Ventricular tachycardia, unspecified: Secondary | ICD-10-CM

## 2017-08-19 DIAGNOSIS — Z888 Allergy status to other drugs, medicaments and biological substances status: Secondary | ICD-10-CM | POA: Insufficient documentation

## 2017-08-19 DIAGNOSIS — I2510 Atherosclerotic heart disease of native coronary artery without angina pectoris: Secondary | ICD-10-CM | POA: Diagnosis not present

## 2017-08-19 DIAGNOSIS — Z7901 Long term (current) use of anticoagulants: Secondary | ICD-10-CM | POA: Diagnosis not present

## 2017-08-19 DIAGNOSIS — I428 Other cardiomyopathies: Secondary | ICD-10-CM | POA: Insufficient documentation

## 2017-08-19 DIAGNOSIS — F329 Major depressive disorder, single episode, unspecified: Secondary | ICD-10-CM | POA: Diagnosis not present

## 2017-08-19 DIAGNOSIS — Z95810 Presence of automatic (implantable) cardiac defibrillator: Secondary | ICD-10-CM | POA: Insufficient documentation

## 2017-08-19 DIAGNOSIS — I252 Old myocardial infarction: Secondary | ICD-10-CM | POA: Insufficient documentation

## 2017-08-19 DIAGNOSIS — Z87891 Personal history of nicotine dependence: Secondary | ICD-10-CM | POA: Insufficient documentation

## 2017-08-19 DIAGNOSIS — I447 Left bundle-branch block, unspecified: Secondary | ICD-10-CM | POA: Diagnosis not present

## 2017-08-19 LAB — PROTIME-INR
INR: 1.36
PROTHROMBIN TIME: 16.6 s — AB (ref 11.4–15.2)

## 2017-08-19 LAB — COMPREHENSIVE METABOLIC PANEL
ALBUMIN: 4.2 g/dL (ref 3.5–5.0)
ALK PHOS: 82 U/L (ref 38–126)
ALT: 17 U/L (ref 14–54)
AST: 27 U/L (ref 15–41)
Anion gap: 17 — ABNORMAL HIGH (ref 5–15)
BILIRUBIN TOTAL: 1 mg/dL (ref 0.3–1.2)
BUN: 27 mg/dL — AB (ref 6–20)
CALCIUM: 9.9 mg/dL (ref 8.9–10.3)
CO2: 30 mmol/L (ref 22–32)
Chloride: 93 mmol/L — ABNORMAL LOW (ref 101–111)
Creatinine, Ser: 1.28 mg/dL — ABNORMAL HIGH (ref 0.44–1.00)
GFR calc Af Amer: 44 mL/min — ABNORMAL LOW (ref >60)
GFR calc non Af Amer: 38 mL/min — ABNORMAL LOW (ref >60)
GLUCOSE: 125 mg/dL — AB (ref 65–99)
Potassium: 2.8 mmol/L — ABNORMAL LOW (ref 3.5–5.1)
Sodium: 140 mmol/L (ref 135–145)
Total Protein: 7.4 g/dL (ref 6.5–8.1)

## 2017-08-19 LAB — CBC
HCT: 41.6 % (ref 36.0–46.0)
Hemoglobin: 13.5 g/dL (ref 12.0–15.0)
MCH: 30.9 pg (ref 26.0–34.0)
MCHC: 32.5 g/dL (ref 30.0–36.0)
MCV: 95.2 fL (ref 78.0–100.0)
Platelets: 208 10*3/uL (ref 150–400)
RBC: 4.37 MIL/uL (ref 3.87–5.11)
RDW: 14.8 % (ref 11.5–15.5)
WBC: 7.6 10*3/uL (ref 4.0–10.5)

## 2017-08-19 LAB — T4, FREE: Free T4: 0.81 ng/dL (ref 0.61–1.12)

## 2017-08-19 LAB — TSH: TSH: 5.291 u[IU]/mL — ABNORMAL HIGH (ref 0.350–4.500)

## 2017-08-19 MED ORDER — SPIRONOLACTONE 25 MG PO TABS: 25.0000 mg | ORAL_TABLET | Freq: Every day | ORAL | 11 refills | Status: DC

## 2017-08-19 NOTE — Telephone Encounter (Signed)
Per pharm D it interacts with the Pamelor if takes too long to dissolve  The sprinkles only come 10 meq so that would be 4 capsules of  Sprinkles to take twice per day.   So the liquid would be best choice.   I can send that pharm D a note and let her know the pt prefers not to do.

## 2017-08-19 NOTE — H&P (View-Only) (Signed)
Patient ID: Bianca [name], female   DOB: 1932-09-25, 84 y.o.   MRN: [id number]    Advanced Heart Failure Clinic Note   Primary Physician: Maurice Small, MD Primary Cardiologist: Dr Marlou Porch Primary HF: Dr. Haroldine Laws   HPI: Bianca [name] is a 84 y.o. female with nonischemic cardiomyopathy, hx of atrial fibrillation-currently with chronic anticoagulation, hx of Chronic kidney disease-stage III, chronic systolic heart failure-status post Bi- ventricular ICD-followed by Dr. Lovena Le. No defibrillations. EF 35%. Symptoms have been very stable. NYHA class II except for hospitalization for heart failure she was discharged on 11/24/12 with primary diagnosis of acute on chronic systolic heart failure.  Admitted 10/24/15 after fall and developing a flank hematoma. HF team consulted to help manage fluid overload while getting multiple blood products to reverse her INR. Got 4u FFP and 2 RBCs, initially. HF team continued to follow for fluid management, including several doses of IV lasix. Lasix increased to 40 mg daily po prior to d/c to SNF.  Discharge weight 253 lbs. Echo in 5/17 with EF 50-55%  Admitted 6/8 -11/29/15 with acute onset SOB and CP. She required BMET and CXR significant for pulmonary edema.  She diuresed 13 lbs.  Discharge weight 235 lbs. Lasix increased to 40 mg po BID.   Admitted 7/5 through 01/01/16 with dyspnea and volume overload. Diuresed with IV lasix then transitioned to torsemide 60 mg daily. Discharge weight 226 pounds. Discharged to [facility] SNF  Echo 01/02/17 with EF dropped back down to 20-25%.  Myoview 01/10/17 with large inferolateral scar and EF 20%. With clear coronaries on [facility] 2008, thought to possibly be artifact, so decided to just follow.   Repeat Echo 04/2017 with continued depressed EF at ~20%.   She presents today for regular follow up. Over past several months has had decreased mental status due to recurrent UTIs, and has had more trouble managing her fluid levels  culmination in an admission requiring IV lasix earlier this month. She took an extra metolazone last week with improvement.  She denies CP. She has been having occasional palpitations, with no clear triggers. She denies SOB, but activity is very limited due to arthritis (knee, hip) and deconditioning. No syncope or presyncope. She lives in a retirement community and was approved for Physical Therapy TODAY. Walks slowly with walker. She denies orthopnea or PND.  No ICD shocks that she has been aware of.   ICD interrogation: Medtronic: Personally reviewed:  VT x 4 episodes with ATP, twice on Feb 4 and Twice on Feb 18th. Fluid index trending up but no crossing. Thoracic impedence below threshold. Activity nearly non-existant.  Past Medical History:  Diagnosis Date  . Acute blood loss anemia   . Arthritis   . CAD (coronary artery disease)/LBBB 01/01/2017  . Chronic systolic heart failure ([place])   . CKD (chronic kidney disease), stage III ([place]) 12/22/2015  . Depression 11/24/2015  . Hypertension   . Hypokalemia   . Myocardial infarction ([place]) 2008  . Neuromuscular disorder ([place])   . Paroxysmal atrial fibrillation ([facility])   . Physical deconditioning   . Right flank hematoma   . Sleep apnea     Current Outpatient Medications  Medication Sig Dispense Refill  . acetaminophen (TYLENOL) 325 MG tablet Take 325-650 mg by mouth 2 (two) times daily as needed for mild pain or headache.     . allopurinol (ZYLOPRIM) 100 MG tablet Take 100 mg by mouth See admin instructions. Takes along with a 300 mg tablet to equal 400  mg     . allopurinol (ZYLOPRIM) 300 MG tablet Take 300 mg by mouth See admin instructions. Take with the 100mg  tablet to equal 400mg  daily     . apixaban (ELIQUIS) 5 MG TABS tablet Take 5 mg by mouth 2 (two) times daily.    Marland Kitchen atorvastatin (LIPITOR) 10 MG tablet TAKE 1 TABLET (10 MG TOTAL) BY MOUTH DAILY AT 6 PM. 90 tablet 3  . Calcium Carbonate-Vit D-Min (CALCIUM/VITAMIN D/MINERALS) 600-200 MG-UNIT  TABS Take 1 tablet by mouth 2 (two) times daily.     . Cholecalciferol (VITAMIN D3) 1000 UNITS CAPS Take 1,000 Units by mouth daily.     . colestipol (COLESTID) 1 G tablet Take 2 g by mouth at bedtime.     . gabapentin (NEURONTIN) 400 MG capsule Take 1 capsule (400 mg total) by mouth 3 (three) times daily. 90 capsule 0  . guaiFENesin (MUCINEX) 600 MG 12 hr tablet Take 1 tablet (600 mg total) by mouth 2 (two) times daily.    . isosorbide mononitrate (IMDUR) 30 MG 24 hr tablet Take 0.5 tablets (15 mg total) by mouth daily. 45 tablet 3  . KLOR-CON M20 20 MEQ tablet TAKE 2 TABLETS (40 MEQ TOTAL) BY MOUTH 2 (TWO) TIMES DAILY. 360 tablet 3  . metolazone (ZAROXOLYN) 2.5 MG tablet Take 2.5-5 mg every Monday 8 tablet 0  . metoprolol succinate (TOPROL-XL) 25 MG 24 hr tablet Take 1 tablet (25 mg total) by mouth daily. 90 tablet 3  . nitroGLYCERIN (NITROSTAT) 0.4 MG SL tablet Place 1 tablet (0.4 mg total) under the tongue every 5 (five) minutes as needed for chest pain. 25 tablet 6  . nortriptyline (PAMELOR) 25 MG capsule Take 25 mg by mouth at bedtime.    . OXYGEN Inhale 2 L/min into the lungs at bedtime as needed (uses as bedtime everynight and then occasionally iun the daytime as needed for SOB).     . Probiotic Product (PROBIOTIC PO) Take 1 tablet by mouth daily.    Marland Kitchen senna-docusate (SENOKOT-S) 8.6-50 MG tablet Take 1 tablet by mouth 2 (two) times daily.     Marland Kitchen spironolactone (ALDACTONE) 25 MG tablet Take 0.5 tablets (12.5 mg total) by mouth daily. 30 tablet 11  . torsemide (DEMADEX) 20 MG tablet Take 4 tablets (80 mg total) every morning and 2 tablets (40 mg total) six hours after morning dosage daily. 540 tablet 3   No current facility-administered medications for this encounter.     Allergies  Allergen Reactions  . Quinapril Hcl Swelling    Tongue and throat  . Tape Other (See Comments)    Plastic tape causes irritation.   . Coreg [Carvedilol]     Very sleepy   . Tessalon [Benzonatate] Swelling  and Other (See Comments)    Capsule opened in mouth, not an allergy  . Neosporin [Neomycin-Polymyxin-Gramicidin] Rash   Social History   Socioeconomic History  . Marital status: Widowed    Spouse name: Not on file  . Number of children: Not on file  . Years of education: Not on file  . Highest education level: Not on file  Social Needs  . Financial resource strain: Not on file  . Food insecurity - worry: Not on file  . Food insecurity - inability: Not on file  . Transportation needs - medical: Not on file  . Transportation needs - non-medical: Not on file  Occupational History  . Not on file  Tobacco Use  . Smoking status: Former Research scientist (life sciences)  .  Smokeless tobacco: Former Network engineer and Sexual Activity  . Alcohol use: No  . Drug use: No  . Sexual activity: Not Currently  Other Topics Concern  . Not on file  Social History Narrative  . Not on file    Family History  Problem Relation Age of Onset  . Stroke Mother   . Congestive Heart Failure Mother   . Tuberculosis Father   . Diabetes Brother   . Stroke Brother   . Cancer Brother   . Heart attack Son    Vitals:   08/19/17 1433  BP: 106/72  Pulse: 98  SpO2: 92%  Weight: 220 lb 8 oz (100 kg)   Wt Readings from Last 3 Encounters:  08/19/17 220 lb 8 oz (100 kg)  08/14/17 209 lb (94.8 kg)  08/12/17 220 lb 1.9 oz (99.8 kg)    PHYSICAL EXAM: General: Elderly appearing. Hard of hearing. Walks slowly with walker HEENT: Normal. Anicreric Neck: Supple. JVP ~6-7. Carotids 2+ bilat; no bruits. No thyromegaly or nodule noted. Cor: PMI nondisplaced. RRR, No M/G/R noted Lungs: CTAB, normal effort. No wheeze Abdomen: Soft, non-tender, non-distended, no HSM. No bruits or masses. +BS  Extremities: No cyanosis, clubbing, or rash. Trace ankle edema. Warm.  Neuro: Alert & orientedx3, cranial nerves grossly intact. moves all 4 extremities w/o difficulty. Affect pleasant but flat   ASSESSMENT & PLAN: 1. NICM EF 35% s/p BiV ICD  - Medtronic.  - Echo 11/2012 20-25% -> Med adjustments 10/2015 EF recovered.50-55%. - Now back down to LVEF 20% as recently as on Echo 04/2017 LVEF 20%, Grade 2 DD.  - NYHA III symptoms, but confounded by deconditioning and arthritis. Her activity is minimal (nearly 0 on optivol). - Volume status looks OK on exam. Optivol with very mild.  - Continue torsemide 80 mg q am and 60 mg pm. Pre-cath labs today.  - Continue Toprol 25 mg daily.  - Increase spiro to 25 mg daily.  - Stop imdur in absence of hydralazine. - Consider losartan next visit. She had angioedema on Quinapril, so is not Entresto candidate.   - Reinforced fluid restriction to < 2 L daily, sodium restriction to less than 2000 mg daily, and the importance of daily weights.  - With drop in EF and VT, raises concern for ischemic component. Plan [facility] tomorrow am.  - If no ischemia, will need to work up further for NICM. SPEP and UPEP today. Consider PYP scan. (No cMRI with ICD) 2. Paroxysmal AF  - CHA2DS2-VASc 6. Regular pulse.  - No AF on ICD. In NSR today per ICD interrogation.  - Denies bleeding on Eliquis. Hold tonight and tomorrow am for Cath.  3. Morbid obesity with h/o CO2 retention - using oxygen at night.  - Not on chronic 02. Has not been show to de-saturate with ambulation.  4. R Flank Hematoma - 10/2015. Stable. She still complaints of tenderness at this sight.  5. VT - x 4 since Feb 4th, each time with successful ATP.   With drop in EF with no improvement with med titration and VT, plan for [facility] to rule out ischemic component. Will need consider further NICM work up if negative. Meds as above. Pre-cath labs today. Hold eliquis for cath tomorrow.   RTC 2 weeks on APP side.   Bianca Friar, PA-C  2:55 PM   Patient seen and examined with the above-signed Advanced Practice Provider and/or Housestaff. I personally reviewed laboratory data, imaging studies and relevant notes. I  independently examined the patient  and formulated the important aspects of the plan. I have edited the note to reflect any of my changes or salient points. I have personally discussed the plan with the patient and/or family.   84 y/o woman with h/o recovered NICM. Cath 10 years ago with normal coronaries. Now with recurrent systolic dysfunction with NYHA III HF and evidence of 4 episodes of VT requiring ATP seen on device interrogation today (Personally performed). Will need R/L heart cath tomorrow and f/u with EP. Case personally d/w Dr. Marlou Porch.   Glori Bickers, MD  10:38 PM

## 2017-08-19 NOTE — Patient Instructions (Signed)
Routine lab work today. Will notify you of abnormal results, otherwise no news is good news!  INCREASE Spironolactone to 25 mg (1 whole tablet) once daily.  You have been scheduled for a heart catheterization. Please see instruction sheet for additional details.  Follow up 2 weeks.  ______________________________________________________ Bianca [name] Code: 9002  Take all medication as prescribed the day of your appointment. Bring all medications with you to your appointment.  Do the following things EVERYDAY: 1) Weigh yourself in the morning before breakfast. Write it down and keep it in a log. 2) Take your medicines as prescribed 3) Eat low salt foods-Limit salt (sodium) to 2000 mg per day.  4) Stay as active as you can everyday 5) Limit all fluids for the day to less than 2 liters

## 2017-08-19 NOTE — Telephone Encounter (Signed)
Spoke with Bianca [name], DPR on file, and she is aware for pt to just stay on what she is on and if her dry mouth gets worse, we will think about a change at that time.  Bianca [name] reports her dry mouth is only worse on Mondays, when she takes the Metolazone and Dr. Marlou Porch was aware of this and has suggested she eat sugar free candy, which she reports pt has been doing and everything was ok.

## 2017-08-19 NOTE — Telephone Encounter (Signed)
[name] states that pt has been breaking it in 1/2 and taking it with applesauce and hasn't had any trouble with it.  She states that it breaks up just fine.  Just let me know what you want to do.  Thanks!

## 2017-08-19 NOTE — Telephone Encounter (Signed)
Just stay on what she is on.  I sent that pharm D a message.  If her dry mouth increases would could try and change.

## 2017-08-19 NOTE — Telephone Encounter (Signed)
Called and spoke with daughter n law, Bianca [name], DPR on file. She is so confused re: why pt's potassium is going to be changed? She stated that the [facility] never mentioned interaction with some of her meds.  She did ask her if the pt is having trouble swallowing, and she told them no she isn't, and that's when the sprinkles were brought up.  She is wanting clarification for: 1:  Where did interactions come from 2.  Pt doesn't have trouble swallowing and she feels that what they are doing is working fine.  Please advise as pt is seeing CHF Clinic today.

## 2017-08-19 NOTE — Progress Notes (Signed)
Patient ID: Bianca [name], female   DOB: 1933-04-11, 84 y.o.   MRN: [id number]    Advanced Heart Failure Clinic Note   Primary Physician: Maurice Small, MD Primary Cardiologist: Dr Marlou Porch Primary HF: Dr. Haroldine Laws   HPI: Bianca [name] is a 84 y.o. female with nonischemic cardiomyopathy, hx of atrial fibrillation-currently with chronic anticoagulation, hx of Chronic kidney disease-stage III, chronic systolic heart failure-status post Bi- ventricular ICD-followed by Dr. Lovena Le. No defibrillations. EF 35%. Symptoms have been very stable. NYHA class II except for hospitalization for heart failure she was discharged on 11/24/12 with primary diagnosis of acute on chronic systolic heart failure.  Admitted 10/24/15 after fall and developing a flank hematoma. HF team consulted to help manage fluid overload while getting multiple blood products to reverse her INR. Got 4u FFP and 2 RBCs, initially. HF team continued to follow for fluid management, including several doses of IV lasix. Lasix increased to 40 mg daily po prior to d/c to SNF.  Discharge weight 253 lbs. Echo in 5/17 with EF 50-55%  Admitted 6/8 -11/29/15 with acute onset SOB and CP. She required BMET and CXR significant for pulmonary edema.  She diuresed 13 lbs.  Discharge weight 235 lbs. Lasix increased to 40 mg po BID.   Admitted 7/5 through 01/01/16 with dyspnea and volume overload. Diuresed with IV lasix then transitioned to torsemide 60 mg daily. Discharge weight 226 pounds. Discharged to [facility]  Echo 01/02/17 with EF dropped back down to 20-25%.  Myoview 01/10/17 with large inferolateral scar and EF 20%. With clear coronaries on [facility] 2008, thought to possibly be artifact, so decided to just follow.   Repeat Echo 04/2017 with continued depressed EF at ~20%.   She presents today for regular follow up. Over past several months has had decreased mental status due to recurrent UTIs, and has had more trouble managing her fluid levels  culmination in an admission requiring IV lasix earlier this month. She took an extra metolazone last week with improvement.  She denies CP. She has been having occasional palpitations, with no clear triggers. She denies SOB, but activity is very limited due to arthritis (knee, hip) and deconditioning. No syncope or presyncope. She lives in a retirement community and was approved for Physical Therapy TODAY. Walks slowly with walker. She denies orthopnea or PND.  No ICD shocks that she has been aware of.   ICD interrogation: Medtronic: Personally reviewed:  VT x 4 episodes with ATP, twice on Feb 4 and Twice on Feb 18th. Fluid index trending up but no crossing. Thoracic impedence below threshold. Activity nearly non-existant.  Past Medical History:  Diagnosis Date  . Acute blood loss anemia   . Arthritis   . CAD (coronary artery disease)/LBBB 01/01/2017  . Chronic systolic heart failure ([place])   . CKD (chronic kidney disease), stage III ([place]) 12/22/2015  . Depression 11/24/2015  . Hypertension   . Hypokalemia   . Myocardial infarction ([place]) 2008  . Neuromuscular disorder ([place])   . Paroxysmal atrial fibrillation ([facility])   . Physical deconditioning   . Right flank hematoma   . Sleep apnea     Current Outpatient Medications  Medication Sig Dispense Refill  . acetaminophen (TYLENOL) 325 MG tablet Take 325-650 mg by mouth 2 (two) times daily as needed for mild pain or headache.     . allopurinol (ZYLOPRIM) 100 MG tablet Take 100 mg by mouth See admin instructions. Takes along with a 300 mg tablet to equal 400  mg     . allopurinol (ZYLOPRIM) 300 MG tablet Take 300 mg by mouth See admin instructions. Take with the 100mg  tablet to equal 400mg  daily     . apixaban (ELIQUIS) 5 MG TABS tablet Take 5 mg by mouth 2 (two) times daily.    Marland Kitchen atorvastatin (LIPITOR) 10 MG tablet TAKE 1 TABLET (10 MG TOTAL) BY MOUTH DAILY AT 6 PM. 90 tablet 3  . Calcium Carbonate-Vit D-Min (CALCIUM/VITAMIN D/MINERALS) 600-200 MG-UNIT  TABS Take 1 tablet by mouth 2 (two) times daily.     . Cholecalciferol (VITAMIN D3) 1000 UNITS CAPS Take 1,000 Units by mouth daily.     . colestipol (COLESTID) 1 G tablet Take 2 g by mouth at bedtime.     . gabapentin (NEURONTIN) 400 MG capsule Take 1 capsule (400 mg total) by mouth 3 (three) times daily. 90 capsule 0  . guaiFENesin (MUCINEX) 600 MG 12 hr tablet Take 1 tablet (600 mg total) by mouth 2 (two) times daily.    . isosorbide mononitrate (IMDUR) 30 MG 24 hr tablet Take 0.5 tablets (15 mg total) by mouth daily. 45 tablet 3  . KLOR-CON M20 20 MEQ tablet TAKE 2 TABLETS (40 MEQ TOTAL) BY MOUTH 2 (TWO) TIMES DAILY. 360 tablet 3  . metolazone (ZAROXOLYN) 2.5 MG tablet Take 2.5-5 mg every Monday 8 tablet 0  . metoprolol succinate (TOPROL-XL) 25 MG 24 hr tablet Take 1 tablet (25 mg total) by mouth daily. 90 tablet 3  . nitroGLYCERIN (NITROSTAT) 0.4 MG SL tablet Place 1 tablet (0.4 mg total) under the tongue every 5 (five) minutes as needed for chest pain. 25 tablet 6  . nortriptyline (PAMELOR) 25 MG capsule Take 25 mg by mouth at bedtime.    . OXYGEN Inhale 2 L/min into the lungs at bedtime as needed (uses as bedtime everynight and then occasionally iun the daytime as needed for SOB).     . Probiotic Product (PROBIOTIC PO) Take 1 tablet by mouth daily.    Marland Kitchen senna-docusate (SENOKOT-S) 8.6-50 MG tablet Take 1 tablet by mouth 2 (two) times daily.     Marland Kitchen spironolactone (ALDACTONE) 25 MG tablet Take 0.5 tablets (12.5 mg total) by mouth daily. 30 tablet 11  . torsemide (DEMADEX) 20 MG tablet Take 4 tablets (80 mg total) every morning and 2 tablets (40 mg total) six hours after morning dosage daily. 540 tablet 3   No current facility-administered medications for this encounter.     Allergies  Allergen Reactions  . Quinapril Hcl Swelling    Tongue and throat  . Tape Other (See Comments)    Plastic tape causes irritation.   . Coreg [Carvedilol]     Very sleepy   . Tessalon [Benzonatate] Swelling  and Other (See Comments)    Capsule opened in mouth, not an allergy  . Neosporin [Neomycin-Polymyxin-Gramicidin] Rash   Social History   Socioeconomic History  . Marital status: Widowed    Spouse name: Not on file  . Number of children: Not on file  . Years of education: Not on file  . Highest education level: Not on file  Social Needs  . Financial resource strain: Not on file  . Food insecurity - worry: Not on file  . Food insecurity - inability: Not on file  . Transportation needs - medical: Not on file  . Transportation needs - non-medical: Not on file  Occupational History  . Not on file  Tobacco Use  . Smoking status: Former Research scientist (life sciences)  .  Smokeless tobacco: Former Network engineer and Sexual Activity  . Alcohol use: No  . Drug use: No  . Sexual activity: Not Currently  Other Topics Concern  . Not on file  Social History Narrative  . Not on file    Family History  Problem Relation Age of Onset  . Stroke Mother   . Congestive Heart Failure Mother   . Tuberculosis Father   . Diabetes Brother   . Stroke Brother   . Cancer Brother   . Heart attack Son    Vitals:   08/19/17 1433  BP: 106/72  Pulse: 98  SpO2: 92%  Weight: 220 lb 8 oz (100 kg)   Wt Readings from Last 3 Encounters:  08/19/17 220 lb 8 oz (100 kg)  08/14/17 209 lb (94.8 kg)  08/12/17 220 lb 1.9 oz (99.8 kg)    PHYSICAL EXAM: General: Elderly appearing. Hard of hearing. Walks slowly with walker HEENT: Normal. Anicreric Neck: Supple. JVP ~6-7. Carotids 2+ bilat; no bruits. No thyromegaly or nodule noted. Cor: PMI nondisplaced. RRR, No M/G/R noted Lungs: CTAB, normal effort. No wheeze Abdomen: Soft, non-tender, non-distended, no HSM. No bruits or masses. +BS  Extremities: No cyanosis, clubbing, or rash. Trace ankle edema. Warm.  Neuro: Alert & orientedx3, cranial nerves grossly intact. moves all 4 extremities w/o difficulty. Affect pleasant but flat   ASSESSMENT & PLAN: 1. NICM EF 35% s/p BiV ICD  - Medtronic.  - Echo 11/2012 20-25% -> Med adjustments 10/2015 EF recovered.50-55%. - Now back down to LVEF 20% as recently as on Echo 04/2017 LVEF 20%, Grade 2 DD.  - NYHA III symptoms, but confounded by deconditioning and arthritis. Her activity is minimal (nearly 0 on optivol). - Volume status looks OK on exam. Optivol with very mild.  - Continue torsemide 80 mg q am and 60 mg pm. Pre-cath labs today.  - Continue Toprol 25 mg daily.  - Increase spiro to 25 mg daily.  - Stop imdur in absence of hydralazine. - Consider losartan next visit. She had angioedema on Quinapril, so is not Entresto candidate.   - Reinforced fluid restriction to < 2 L daily, sodium restriction to less than 2000 mg daily, and the importance of daily weights.  - With drop in EF and VT, raises concern for ischemic component. Plan [facility] tomorrow am.  - If no ischemia, will need to work up further for NICM. SPEP and UPEP today. Consider PYP scan. (No cMRI with ICD) 2. Paroxysmal AF  - CHA2DS2-VASc 6. Regular pulse.  - No AF on ICD. In NSR today per ICD interrogation.  - Denies bleeding on Eliquis. Hold tonight and tomorrow am for Cath.  3. Morbid obesity with h/o CO2 retention - using oxygen at night.  - Not on chronic 02. Has not been show to de-saturate with ambulation.  4. R Flank Hematoma - 10/2015. Stable. She still complaints of tenderness at this sight.  5. VT - x 4 since Feb 4th, each time with successful ATP.   With drop in EF with no improvement with med titration and VT, plan for [facility] to rule out ischemic component. Will need consider further NICM work up if negative. Meds as above. Pre-cath labs today. Hold eliquis for cath tomorrow.   RTC 2 weeks on APP side.   Shirley Friar, PA-C  2:55 PM   Patient seen and examined with the above-signed Advanced Practice Provider and/or Housestaff. I personally reviewed laboratory data, imaging studies and relevant notes. I  independently examined the patient  and formulated the important aspects of the plan. I have edited the note to reflect any of my changes or salient points. I have personally discussed the plan with the patient and/or family.   84 y/o woman with h/o recovered NICM. Cath 10 years ago with normal coronaries. Now with recurrent systolic dysfunction with NYHA III HF and evidence of 4 episodes of VT requiring ATP seen on device interrogation today (Personally performed). Will need R/L heart cath tomorrow and f/u with EP. Case personally d/w Dr. Marlou Porch.   Glori Bickers, MD  10:38 PM

## 2017-08-20 ENCOUNTER — Encounter (HOSPITAL_COMMUNITY): Payer: Self-pay | Admitting: General Practice

## 2017-08-20 ENCOUNTER — Observation Stay (HOSPITAL_BASED_OUTPATIENT_CLINIC_OR_DEPARTMENT_OTHER)
Admission: RE | Admit: 2017-08-20 | Discharge: 2017-08-21 | Disposition: A | Discharge status: Home Health | Payer: PPO | Source: Ambulatory Visit | Attending: Internal Medicine | Admitting: Internal Medicine

## 2017-08-20 ENCOUNTER — Other Ambulatory Visit: Payer: Self-pay

## 2017-08-20 ENCOUNTER — Encounter (HOSPITAL_COMMUNITY)
Admission: RE | Disposition: A | Discharge status: Home Health | Payer: Self-pay | Source: Ambulatory Visit | Attending: Internal Medicine

## 2017-08-20 DIAGNOSIS — Z833 Family history of diabetes mellitus: Secondary | ICD-10-CM

## 2017-08-20 DIAGNOSIS — Z87440 Personal history of urinary (tract) infections: Secondary | ICD-10-CM | POA: Insufficient documentation

## 2017-08-20 DIAGNOSIS — I428 Other cardiomyopathies: Secondary | ICD-10-CM

## 2017-08-20 DIAGNOSIS — N183 Chronic kidney disease, stage 3 (moderate): Secondary | ICD-10-CM | POA: Diagnosis present

## 2017-08-20 DIAGNOSIS — R2681 Unsteadiness on feet: Secondary | ICD-10-CM | POA: Diagnosis not present

## 2017-08-20 DIAGNOSIS — I252 Old myocardial infarction: Secondary | ICD-10-CM

## 2017-08-20 DIAGNOSIS — Z7901 Long term (current) use of anticoagulants: Secondary | ICD-10-CM | POA: Diagnosis not present

## 2017-08-20 DIAGNOSIS — I480 Paroxysmal atrial fibrillation: Secondary | ICD-10-CM | POA: Diagnosis present

## 2017-08-20 DIAGNOSIS — G4733 Obstructive sleep apnea (adult) (pediatric): Secondary | ICD-10-CM | POA: Diagnosis present

## 2017-08-20 DIAGNOSIS — R51 Headache: Secondary | ICD-10-CM | POA: Diagnosis not present

## 2017-08-20 DIAGNOSIS — I255 Ischemic cardiomyopathy: Secondary | ICD-10-CM | POA: Diagnosis present

## 2017-08-20 DIAGNOSIS — Z95810 Presence of automatic (implantable) cardiac defibrillator: Secondary | ICD-10-CM

## 2017-08-20 DIAGNOSIS — I2510 Atherosclerotic heart disease of native coronary artery without angina pectoris: Secondary | ICD-10-CM | POA: Diagnosis present

## 2017-08-20 DIAGNOSIS — Z79899 Other long term (current) drug therapy: Secondary | ICD-10-CM

## 2017-08-20 DIAGNOSIS — Z91048 Other nonmedicinal substance allergy status: Secondary | ICD-10-CM

## 2017-08-20 DIAGNOSIS — E785 Hyperlipidemia, unspecified: Secondary | ICD-10-CM | POA: Diagnosis present

## 2017-08-20 DIAGNOSIS — I471 Supraventricular tachycardia: Secondary | ICD-10-CM | POA: Diagnosis present

## 2017-08-20 DIAGNOSIS — I472 Ventricular tachycardia: Secondary | ICD-10-CM | POA: Diagnosis present

## 2017-08-20 DIAGNOSIS — G629 Polyneuropathy, unspecified: Secondary | ICD-10-CM | POA: Diagnosis present

## 2017-08-20 DIAGNOSIS — N179 Acute kidney failure, unspecified: Secondary | ICD-10-CM | POA: Diagnosis present

## 2017-08-20 DIAGNOSIS — N390 Urinary tract infection, site not specified: Secondary | ICD-10-CM | POA: Diagnosis present

## 2017-08-20 DIAGNOSIS — I5040 Unspecified combined systolic (congestive) and diastolic (congestive) heart failure: Secondary | ICD-10-CM | POA: Diagnosis not present

## 2017-08-20 DIAGNOSIS — Z888 Allergy status to other drugs, medicaments and biological substances status: Secondary | ICD-10-CM | POA: Insufficient documentation

## 2017-08-20 DIAGNOSIS — R05 Cough: Secondary | ICD-10-CM | POA: Diagnosis not present

## 2017-08-20 DIAGNOSIS — I5023 Acute on chronic systolic (congestive) heart failure: Secondary | ICD-10-CM | POA: Diagnosis present

## 2017-08-20 DIAGNOSIS — S301XXA Contusion of abdominal wall, initial encounter: Secondary | ICD-10-CM | POA: Insufficient documentation

## 2017-08-20 DIAGNOSIS — Z9981 Dependence on supplemental oxygen: Secondary | ICD-10-CM | POA: Diagnosis not present

## 2017-08-20 DIAGNOSIS — R748 Abnormal levels of other serum enzymes: Secondary | ICD-10-CM | POA: Diagnosis not present

## 2017-08-20 DIAGNOSIS — R0789 Other chest pain: Secondary | ICD-10-CM | POA: Diagnosis not present

## 2017-08-20 DIAGNOSIS — R278 Other lack of coordination: Secondary | ICD-10-CM | POA: Diagnosis not present

## 2017-08-20 DIAGNOSIS — I5043 Acute on chronic combined systolic (congestive) and diastolic (congestive) heart failure: Secondary | ICD-10-CM | POA: Diagnosis not present

## 2017-08-20 DIAGNOSIS — Z7189 Other specified counseling: Secondary | ICD-10-CM | POA: Diagnosis not present

## 2017-08-20 DIAGNOSIS — M6281 Muscle weakness (generalized): Secondary | ICD-10-CM | POA: Diagnosis not present

## 2017-08-20 DIAGNOSIS — R002 Palpitations: Secondary | ICD-10-CM | POA: Diagnosis not present

## 2017-08-20 DIAGNOSIS — Z85828 Personal history of other malignant neoplasm of skin: Secondary | ICD-10-CM

## 2017-08-20 DIAGNOSIS — Z881 Allergy status to other antibiotic agents status: Secondary | ICD-10-CM | POA: Insufficient documentation

## 2017-08-20 DIAGNOSIS — K5900 Constipation, unspecified: Secondary | ICD-10-CM | POA: Diagnosis present

## 2017-08-20 DIAGNOSIS — I130 Hypertensive heart and chronic kidney disease with heart failure and stage 1 through stage 4 chronic kidney disease, or unspecified chronic kidney disease: Secondary | ICD-10-CM

## 2017-08-20 DIAGNOSIS — Z7982 Long term (current) use of aspirin: Secondary | ICD-10-CM | POA: Diagnosis not present

## 2017-08-20 DIAGNOSIS — E7800 Pure hypercholesterolemia, unspecified: Secondary | ICD-10-CM | POA: Diagnosis present

## 2017-08-20 DIAGNOSIS — I272 Pulmonary hypertension, unspecified: Secondary | ICD-10-CM

## 2017-08-20 DIAGNOSIS — Z6833 Body mass index (BMI) 33.0-33.9, adult: Secondary | ICD-10-CM | POA: Insufficient documentation

## 2017-08-20 DIAGNOSIS — K769 Liver disease, unspecified: Secondary | ICD-10-CM | POA: Diagnosis not present

## 2017-08-20 DIAGNOSIS — R945 Abnormal results of liver function studies: Secondary | ICD-10-CM | POA: Diagnosis not present

## 2017-08-20 DIAGNOSIS — R410 Disorientation, unspecified: Secondary | ICD-10-CM | POA: Diagnosis not present

## 2017-08-20 DIAGNOSIS — Z87891 Personal history of nicotine dependence: Secondary | ICD-10-CM

## 2017-08-20 DIAGNOSIS — G9340 Encephalopathy, unspecified: Secondary | ICD-10-CM | POA: Diagnosis not present

## 2017-08-20 DIAGNOSIS — R4182 Altered mental status, unspecified: Secondary | ICD-10-CM | POA: Diagnosis not present

## 2017-08-20 DIAGNOSIS — Z6835 Body mass index (BMI) 35.0-35.9, adult: Secondary | ICD-10-CM

## 2017-08-20 DIAGNOSIS — R0602 Shortness of breath: Secondary | ICD-10-CM | POA: Diagnosis present

## 2017-08-20 DIAGNOSIS — R202 Paresthesia of skin: Secondary | ICD-10-CM | POA: Diagnosis not present

## 2017-08-20 DIAGNOSIS — E875 Hyperkalemia: Secondary | ICD-10-CM | POA: Diagnosis present

## 2017-08-20 DIAGNOSIS — G4730 Sleep apnea, unspecified: Secondary | ICD-10-CM

## 2017-08-20 DIAGNOSIS — Z8249 Family history of ischemic heart disease and other diseases of the circulatory system: Secondary | ICD-10-CM

## 2017-08-20 DIAGNOSIS — M1990 Unspecified osteoarthritis, unspecified site: Secondary | ICD-10-CM | POA: Insufficient documentation

## 2017-08-20 DIAGNOSIS — R7989 Other specified abnormal findings of blood chemistry: Secondary | ICD-10-CM | POA: Diagnosis not present

## 2017-08-20 DIAGNOSIS — Z7902 Long term (current) use of antithrombotics/antiplatelets: Secondary | ICD-10-CM

## 2017-08-20 DIAGNOSIS — Z515 Encounter for palliative care: Secondary | ICD-10-CM | POA: Diagnosis not present

## 2017-08-20 DIAGNOSIS — I5022 Chronic systolic (congestive) heart failure: Secondary | ICD-10-CM | POA: Diagnosis not present

## 2017-08-20 DIAGNOSIS — Z955 Presence of coronary angioplasty implant and graft: Secondary | ICD-10-CM | POA: Diagnosis not present

## 2017-08-20 DIAGNOSIS — R079 Chest pain, unspecified: Secondary | ICD-10-CM | POA: Diagnosis not present

## 2017-08-20 HISTORY — DX: Pure hypercholesterolemia, unspecified: E78.00

## 2017-08-20 HISTORY — DX: Polyneuropathy, unspecified: G62.9

## 2017-08-20 HISTORY — DX: Pneumonia, unspecified organism: J18.9

## 2017-08-20 HISTORY — PX: RIGHT/LEFT HEART CATH AND CORONARY ANGIOGRAPHY: CATH118266

## 2017-08-20 HISTORY — PX: CORONARY STENT INTERVENTION: CATH118234

## 2017-08-20 HISTORY — DX: Adverse effect of unspecified anesthetic, initial encounter: T41.45XA

## 2017-08-20 HISTORY — DX: Other complications of anesthesia, initial encounter: T88.59XA

## 2017-08-20 HISTORY — DX: Dependence on supplemental oxygen: Z99.81

## 2017-08-20 HISTORY — DX: Obstructive sleep apnea (adult) (pediatric): G47.33

## 2017-08-20 HISTORY — DX: Low back pain, unspecified: M54.50

## 2017-08-20 HISTORY — DX: Presence of automatic (implantable) cardiac defibrillator: Z95.810

## 2017-08-20 HISTORY — DX: Basal cell carcinoma of skin, unspecified: C44.91

## 2017-08-20 HISTORY — DX: Presence of cardiac pacemaker: Z95.0

## 2017-08-20 HISTORY — DX: Personal history of other medical treatment: Z92.89

## 2017-08-20 HISTORY — DX: Other chronic pain: G89.29

## 2017-08-20 HISTORY — DX: Low back pain: M54.5

## 2017-08-20 HISTORY — DX: Gout, unspecified: M10.9

## 2017-08-20 HISTORY — DX: Dependence on other enabling machines and devices: Z99.89

## 2017-08-20 LAB — POCT I-STAT 3, VENOUS BLOOD GAS (G3P V)
ACID-BASE DEFICIT: 2 mmol/L (ref 0.0–2.0)
ACID-BASE EXCESS: 8 mmol/L — AB (ref 0.0–2.0)
Acid-Base Excess: 5 mmol/L — ABNORMAL HIGH (ref 0.0–2.0)
Acid-Base Excess: 6 mmol/L — ABNORMAL HIGH (ref 0.0–2.0)
BICARBONATE: 23.8 mmol/L (ref 20.0–28.0)
BICARBONATE: 34.1 mmol/L — AB (ref 20.0–28.0)
Bicarbonate: 31.3 mmol/L — ABNORMAL HIGH (ref 20.0–28.0)
Bicarbonate: 31.9 mmol/L — ABNORMAL HIGH (ref 20.0–28.0)
O2 SAT: 37 %
O2 SAT: 38 %
O2 SAT: 49 %
O2 Saturation: 51 %
PCO2 VEN: 44.4 mmHg (ref 44.0–60.0)
PCO2 VEN: 51.8 mmHg (ref 44.0–60.0)
PH VEN: 7.407 (ref 7.250–7.430)
PO2 VEN: 24 mmHg — AB (ref 32.0–45.0)
TCO2: 25 mmol/L (ref 22–32)
TCO2: 33 mmol/L — AB (ref 22–32)
TCO2: 34 mmol/L — ABNORMAL HIGH (ref 22–32)
TCO2: 36 mmol/L — ABNORMAL HIGH (ref 22–32)
pCO2, Ven: 52.2 mmHg (ref 44.0–60.0)
pCO2, Ven: 54.2 mmHg (ref 44.0–60.0)
pH, Ven: 7.338 (ref 7.250–7.430)
pH, Ven: 7.389 (ref 7.250–7.430)
pH, Ven: 7.395 (ref 7.250–7.430)
pO2, Ven: 23 mmHg — CL (ref 32.0–45.0)
pO2, Ven: 27 mmHg — CL (ref 32.0–45.0)
pO2, Ven: 28 mmHg — CL (ref 32.0–45.0)

## 2017-08-20 LAB — BASIC METABOLIC PANEL
Anion gap: 15 (ref 5–15)
BUN: 30 mg/dL — AB (ref 6–20)
CALCIUM: 9.7 mg/dL (ref 8.9–10.3)
CO2: 29 mmol/L (ref 22–32)
CREATININE: 1.1 mg/dL — AB (ref 0.44–1.00)
Chloride: 93 mmol/L — ABNORMAL LOW (ref 101–111)
GFR calc non Af Amer: 45 mL/min — ABNORMAL LOW (ref >60)
GFR, EST AFRICAN AMERICAN: 53 mL/min — AB (ref >60)
Glucose, Bld: 130 mg/dL — ABNORMAL HIGH (ref 65–99)
Potassium: 3.2 mmol/L — ABNORMAL LOW (ref 3.5–5.1)
SODIUM: 137 mmol/L (ref 135–145)

## 2017-08-20 LAB — PROTEIN ELECTROPHORESIS, SERUM
A/G Ratio: 1.2 (ref 0.7–1.7)
Albumin ELP: 4 g/dL (ref 2.9–4.4)
Alpha-1-Globulin: 0.2 g/dL (ref 0.0–0.4)
Alpha-2-Globulin: 0.9 g/dL (ref 0.4–1.0)
Beta Globulin: 1.1 g/dL (ref 0.7–1.3)
GAMMA GLOBULIN: 1.1 g/dL (ref 0.4–1.8)
GLOBULIN, TOTAL: 3.3 g/dL (ref 2.2–3.9)
TOTAL PROTEIN ELP: 7.3 g/dL (ref 6.0–8.5)

## 2017-08-20 LAB — POCT I-STAT 3, ART BLOOD GAS (G3+)
ACID-BASE DEFICIT: 1 mmol/L (ref 0.0–2.0)
ACID-BASE EXCESS: 5 mmol/L — AB (ref 0.0–2.0)
BICARBONATE: 27.7 mmol/L (ref 20.0–28.0)
BICARBONATE: 30 mmol/L — AB (ref 20.0–28.0)
O2 SAT: 88 %
O2 Saturation: 81 %
PH ART: 7.432 (ref 7.350–7.450)
TCO2: 30 mmol/L (ref 22–32)
TCO2: 31 mmol/L (ref 22–32)
pCO2 arterial: 45 mmHg (ref 32.0–48.0)
pCO2 arterial: 63.1 mmHg — ABNORMAL HIGH (ref 32.0–48.0)
pH, Arterial: 7.251 — ABNORMAL LOW (ref 7.350–7.450)
pO2, Arterial: 44 mmHg — ABNORMAL LOW (ref 83.0–108.0)
pO2, Arterial: 65 mmHg — ABNORMAL LOW (ref 83.0–108.0)

## 2017-08-20 LAB — POCT ACTIVATED CLOTTING TIME: ACTIVATED CLOTTING TIME: 323 s

## 2017-08-20 SURGERY — RIGHT/LEFT HEART CATH AND CORONARY ANGIOGRAPHY
Anesthesia: LOCAL

## 2017-08-20 MED ORDER — HEPARIN SODIUM (PORCINE) 1000 UNIT/ML IJ SOLN
INTRAMUSCULAR | Status: DC | PRN
  Administered 2017-08-20: 7000 [IU] via INTRAVENOUS

## 2017-08-20 MED ORDER — SODIUM CHLORIDE 0.9 % IV SOLN: INTRAVENOUS | Status: DC

## 2017-08-20 MED ORDER — MIDAZOLAM HCL 2 MG/2ML IJ SOLN
INTRAMUSCULAR | Status: AC
  Filled 2017-08-20: qty 2

## 2017-08-20 MED ORDER — NITROGLYCERIN 0.4 MG SL SUBL: 0.4000 mg | SUBLINGUAL_TABLET | SUBLINGUAL | Status: DC | PRN

## 2017-08-20 MED ORDER — APIXABAN 5 MG PO TABS
5.0000 mg | ORAL_TABLET | Freq: Two times a day (BID) | ORAL | Status: DC
  Administered 2017-08-20 – 2017-08-21 (×2): 5 mg via ORAL
  Filled 2017-08-20 (×2): qty 1

## 2017-08-20 MED ORDER — SODIUM CHLORIDE 0.9% FLUSH: 3.0000 mL | INTRAVENOUS | Status: DC | PRN

## 2017-08-20 MED ORDER — MIDAZOLAM HCL 2 MG/2ML IJ SOLN
INTRAMUSCULAR | Status: DC | PRN
  Administered 2017-08-20: 1 mg via INTRAVENOUS

## 2017-08-20 MED ORDER — METOPROLOL SUCCINATE ER 25 MG PO TB24
25.0000 mg | ORAL_TABLET | Freq: Every day | ORAL | Status: DC
  Administered 2017-08-21: 25 mg via ORAL
  Filled 2017-08-20: qty 1

## 2017-08-20 MED ORDER — SENNOSIDES-DOCUSATE SODIUM 8.6-50 MG PO TABS
1.0000 | ORAL_TABLET | Freq: Two times a day (BID) | ORAL | Status: DC
  Administered 2017-08-21: 1 via ORAL
  Filled 2017-08-20 (×2): qty 1

## 2017-08-20 MED ORDER — LABETALOL HCL 5 MG/ML IV SOLN: 10.0000 mg | INTRAVENOUS | Status: AC | PRN

## 2017-08-20 MED ORDER — ACETAMINOPHEN 325 MG PO TABS
650.0000 mg | ORAL_TABLET | Freq: Two times a day (BID) | ORAL | Status: DC
  Administered 2017-08-20 – 2017-08-21 (×2): 650 mg via ORAL
  Filled 2017-08-20 (×2): qty 2

## 2017-08-20 MED ORDER — TORSEMIDE 20 MG PO TABS
90.0000 mg | ORAL_TABLET | Freq: Every day | ORAL | Status: DC
  Administered 2017-08-20: 15:00:00 90 mg via ORAL
  Filled 2017-08-20 (×2): qty 1

## 2017-08-20 MED ORDER — ATORVASTATIN CALCIUM 10 MG PO TABS
10.0000 mg | ORAL_TABLET | Freq: Every day | ORAL | Status: DC
  Administered 2017-08-20: 10 mg via ORAL
  Filled 2017-08-20: qty 1

## 2017-08-20 MED ORDER — HEPARIN SODIUM (PORCINE) 1000 UNIT/ML IJ SOLN
INTRAMUSCULAR | Status: DC | PRN
  Administered 2017-08-20: 2000 [IU] via INTRAVENOUS

## 2017-08-20 MED ORDER — HEPARIN SODIUM (PORCINE) 1000 UNIT/ML IJ SOLN
INTRAMUSCULAR | Status: AC
  Filled 2017-08-20: qty 1

## 2017-08-20 MED ORDER — IOPAMIDOL (ISOVUE-370) INJECTION 76%
INTRAVENOUS | Status: DC | PRN
  Administered 2017-08-20: 70 mL via INTRA_ARTERIAL

## 2017-08-20 MED ORDER — ALLOPURINOL 300 MG PO TABS: 300.0000 mg | ORAL_TABLET | ORAL | Status: DC

## 2017-08-20 MED ORDER — SODIUM CHLORIDE 0.9% FLUSH: 3.0000 mL | Freq: Two times a day (BID) | INTRAVENOUS | Status: DC

## 2017-08-20 MED ORDER — ACETAMINOPHEN 325 MG PO TABS
650.0000 mg | ORAL_TABLET | ORAL | Status: DC | PRN
Start: 2017-08-20 — End: 2017-08-20

## 2017-08-20 MED ORDER — FENTANYL CITRATE (PF) 100 MCG/2ML IJ SOLN
INTRAMUSCULAR | Status: DC | PRN
  Administered 2017-08-20: 25 ug via INTRAVENOUS

## 2017-08-20 MED ORDER — HEPARIN (PORCINE) IN NACL 2-0.9 UNIT/ML-% IJ SOLN
INTRAMUSCULAR | Status: AC
  Filled 2017-08-20: qty 1000

## 2017-08-20 MED ORDER — ONDANSETRON HCL 4 MG/2ML IJ SOLN: 4.0000 mg | Freq: Four times a day (QID) | INTRAMUSCULAR | Status: DC | PRN

## 2017-08-20 MED ORDER — ASPIRIN 81 MG PO CHEW
CHEWABLE_TABLET | ORAL | Status: AC
  Filled 2017-08-20: qty 1

## 2017-08-20 MED ORDER — IOPAMIDOL (ISOVUE-370) INJECTION 76%
INTRAVENOUS | Status: DC | PRN
  Administered 2017-08-20: 50 mL via INTRA_ARTERIAL

## 2017-08-20 MED ORDER — ACETAMINOPHEN ER 650 MG PO TBCR: 1300.0000 mg | EXTENDED_RELEASE_TABLET | Freq: Two times a day (BID) | ORAL | Status: DC

## 2017-08-20 MED ORDER — SODIUM CHLORIDE 0.9 % IV SOLN: INTRAVENOUS | Status: AC

## 2017-08-20 MED ORDER — ACETAMINOPHEN 325 MG PO TABS: 650.0000 mg | ORAL_TABLET | ORAL | Status: DC | PRN

## 2017-08-20 MED ORDER — ALLOPURINOL 100 MG PO TABS
400.0000 mg | ORAL_TABLET | Freq: Every day | ORAL | Status: DC
  Administered 2017-08-21: 10:00:00 400 mg via ORAL
  Filled 2017-08-20: qty 4

## 2017-08-20 MED ORDER — POTASSIUM CHLORIDE CRYS ER 20 MEQ PO TBCR
40.0000 meq | EXTENDED_RELEASE_TABLET | Freq: Every day | ORAL | Status: DC
  Administered 2017-08-21: 40 meq via ORAL
  Filled 2017-08-20: qty 2

## 2017-08-20 MED ORDER — GUAIFENESIN ER 600 MG PO TB12
600.0000 mg | ORAL_TABLET | Freq: Two times a day (BID) | ORAL | Status: DC
  Administered 2017-08-20 – 2017-08-21 (×2): 600 mg via ORAL
  Filled 2017-08-20 (×2): qty 1

## 2017-08-20 MED ORDER — ANGIOPLASTY BOOK
Freq: Once | Status: AC
  Administered 2017-08-20: 22:00:00 1
  Filled 2017-08-20: qty 1

## 2017-08-20 MED ORDER — LIDOCAINE HCL (PF) 1 % IJ SOLN
INTRAMUSCULAR | Status: DC | PRN
  Administered 2017-08-20 (×2): 2 mL via INTRADERMAL

## 2017-08-20 MED ORDER — GABAPENTIN 400 MG PO CAPS
400.0000 mg | ORAL_CAPSULE | Freq: Three times a day (TID) | ORAL | Status: DC
  Administered 2017-08-20 – 2017-08-21 (×3): 400 mg via ORAL
  Filled 2017-08-20 (×3): qty 1

## 2017-08-20 MED ORDER — ISOSORBIDE MONONITRATE 15 MG HALF TABLET
15.0000 mg | ORAL_TABLET | Freq: Every day | ORAL | Status: DC
  Administered 2017-08-21: 15 mg via ORAL
  Filled 2017-08-20: qty 1

## 2017-08-20 MED ORDER — VERAPAMIL HCL 2.5 MG/ML IV SOLN
INTRAVENOUS | Status: DC | PRN
  Administered 2017-08-20: 10 mL via INTRA_ARTERIAL

## 2017-08-20 MED ORDER — ASPIRIN 81 MG PO CHEW
81.0000 mg | CHEWABLE_TABLET | Freq: Every day | ORAL | Status: DC
  Administered 2017-08-21: 10:00:00 81 mg via ORAL
  Filled 2017-08-20: qty 1

## 2017-08-20 MED ORDER — ASPIRIN 81 MG PO CHEW
81.0000 mg | CHEWABLE_TABLET | ORAL | Status: AC
  Administered 2017-08-20: 81 mg via ORAL

## 2017-08-20 MED ORDER — LIDOCAINE HCL 1 % IJ SOLN
INTRAMUSCULAR | Status: AC
  Filled 2017-08-20: qty 20

## 2017-08-20 MED ORDER — FENTANYL CITRATE (PF) 100 MCG/2ML IJ SOLN
INTRAMUSCULAR | Status: AC
  Filled 2017-08-20: qty 2

## 2017-08-20 MED ORDER — IOPAMIDOL (ISOVUE-370) INJECTION 76%
INTRAVENOUS | Status: AC
  Filled 2017-08-20: qty 50

## 2017-08-20 MED ORDER — CLOPIDOGREL BISULFATE 75 MG PO TABS
75.0000 mg | ORAL_TABLET | Freq: Every day | ORAL | Status: DC
  Administered 2017-08-21: 09:00:00 75 mg via ORAL
  Filled 2017-08-20: qty 1

## 2017-08-20 MED ORDER — SODIUM CHLORIDE 0.9 % IV SOLN: 250.0000 mL | INTRAVENOUS | Status: DC | PRN

## 2017-08-20 MED ORDER — HYDRALAZINE HCL 20 MG/ML IJ SOLN: 5.0000 mg | INTRAMUSCULAR | Status: AC | PRN

## 2017-08-20 MED ORDER — COLESTIPOL HCL 1 G PO TABS
2.0000 g | ORAL_TABLET | Freq: Every day | ORAL | Status: DC
  Administered 2017-08-20: 22:00:00 2 g via ORAL
  Filled 2017-08-20: qty 2

## 2017-08-20 MED ORDER — NORTRIPTYLINE HCL 25 MG PO CAPS
25.0000 mg | ORAL_CAPSULE | Freq: Every day | ORAL | Status: DC
  Administered 2017-08-20: 25 mg via ORAL
  Filled 2017-08-20: qty 1

## 2017-08-20 MED ORDER — CLOPIDOGREL BISULFATE 300 MG PO TABS
ORAL_TABLET | ORAL | Status: DC | PRN
  Administered 2017-08-20: 600 mg via ORAL

## 2017-08-20 MED ORDER — HEPARIN (PORCINE) IN NACL 2-0.9 UNIT/ML-% IJ SOLN
INTRAMUSCULAR | Status: AC | PRN
  Administered 2017-08-20 (×2): 500 mL via INTRA_ARTERIAL

## 2017-08-20 MED ORDER — IOPAMIDOL (ISOVUE-370) INJECTION 76%
INTRAVENOUS | Status: AC
  Filled 2017-08-20: qty 100

## 2017-08-20 MED ORDER — POTASSIUM CHLORIDE CRYS ER 20 MEQ PO TBCR
40.0000 meq | EXTENDED_RELEASE_TABLET | Freq: Once | ORAL | Status: AC
  Administered 2017-08-20: 40 meq via ORAL

## 2017-08-20 MED ORDER — SPIRONOLACTONE 25 MG PO TABS
25.0000 mg | ORAL_TABLET | Freq: Every day | ORAL | Status: DC
Start: 2017-08-21 — End: 2017-08-21
  Administered 2017-08-21: 10:00:00 25 mg via ORAL
  Filled 2017-08-20: qty 1

## 2017-08-20 MED ORDER — POTASSIUM CHLORIDE CRYS ER 20 MEQ PO TBCR
EXTENDED_RELEASE_TABLET | ORAL | Status: AC
  Filled 2017-08-20: qty 2

## 2017-08-20 MED ORDER — VERAPAMIL HCL 2.5 MG/ML IV SOLN
INTRAVENOUS | Status: AC
  Filled 2017-08-20: qty 2

## 2017-08-20 SURGICAL SUPPLY — 22 items
BALLN SAPPHIRE 2.0X20 (BALLOONS) ×2
BALLN ~~LOC~~ EMERGE MR 3.25X20 (BALLOONS) ×2
BALLOON SAPPHIRE 2.0X20 (BALLOONS) IMPLANT
BALLOON ~~LOC~~ EMERGE MR 3.25X20 (BALLOONS) IMPLANT
CATH BALLN WEDGE 5F 110CM (CATHETERS) ×1 IMPLANT
CATH IMPULSE 5F ANG/FL3.5 (CATHETERS) ×1 IMPLANT
CATH INFINITI 4FR JL3.5 (CATHETERS) ×1 IMPLANT
CATH INFINITI 5 FR 3DRC (CATHETERS) ×1 IMPLANT
CATH LAUNCHER 5F EBU3.5 (CATHETERS) ×1 IMPLANT
DEVICE RAD COMP TR BAND LRG (VASCULAR PRODUCTS) ×1 IMPLANT
ELECT DEFIB PAD ADLT CADENCE (PAD) ×1 IMPLANT
GLIDESHEATH SLEND SS 6F .021 (SHEATH) ×1 IMPLANT
GUIDEWIRE INQWIRE 1.5J.035X260 (WIRE) IMPLANT
INQWIRE 1.5J .035X260CM (WIRE) ×2
KIT ENCORE 26 ADVANTAGE (KITS) ×1 IMPLANT
KIT HEART LEFT (KITS) ×2 IMPLANT
PACK CARDIAC CATHETERIZATION (CUSTOM PROCEDURE TRAY) ×2 IMPLANT
SHEATH GLIDE SLENDER 4/5FR (SHEATH) ×1 IMPLANT
STENT SIERRA 3.00 X 33 MM (Permanent Stent) ×1 IMPLANT
TRANSDUCER W/STOPCOCK (MISCELLANEOUS) ×2 IMPLANT
TUBING CIL FLEX 10 FLL-RA (TUBING) ×2 IMPLANT
WIRE COUGAR XT STRL 190CM (WIRE) ×1 IMPLANT

## 2017-08-20 NOTE — Progress Notes (Signed)
TR BAND REMOVAL  LOCATION:    right radial  DEFLATED PER PROTOCOL:    Yes.    TIME BAND OFF / DRESSING APPLIED:    1445   SITE UPON ARRIVAL:    Level 0  SITE AFTER BAND REMOVAL:    Level 0  CIRCULATION SENSATION AND MOVEMENT:    Within Normal Limits   Yes.    COMMENTS:   Patient tolerated well, Post removal instructions provided, dressing applied C/D/I, no hematoma and no bleeding noted. Family remains at bedside. No S/S of distress noted or complaints voiced at this time.
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
## 2017-08-20 NOTE — Progress Notes (Signed)
Patient states, "My back feels much better, it is no linger hurting."

## 2017-08-20 NOTE — Interval H&P Note (Signed)
History and Physical Interval Note:  08/20/2017 9:26 AM  Bianca [name]  has presented today for surgery, with the diagnosis of hf  The various methods of treatment have been discussed with the patient and family. After consideration of risks, benefits and other options for treatment, the patient has consented to  Procedure(s): RIGHT/LEFT HEART CATH AND CORONARY ANGIOGRAPHY (N/A) and possible coronary angioplasty as a surgical intervention .  The patient's history has been reviewed, patient examined, no change in status, stable for surgery.  I have reviewed the patient's chart and labs.  Questions were answered to the patient's satisfaction.     Daniel Bensimhon

## 2017-08-20 NOTE — Progress Notes (Signed)
Patient is lying in bed with eyes opened and unlabored breathing noted. Received report from [place] at [facility] and began care of patient at this time. Patient has been repositioned in bed for complaints of back pain, states "I do not want any tylenol or pain medicine now, this will help." coban noted to right brachial site and right TR band is I place level 0. Family was at bedside with admissions nurse, no S/S of distress noted or complaints voiced at this time. Call bell is in reach and bed is in lowest position. Bed alarm is in place. Will continue to monitor.

## 2017-08-21 ENCOUNTER — Other Ambulatory Visit (HOSPITAL_COMMUNITY): Payer: Self-pay

## 2017-08-21 ENCOUNTER — Telehealth: Payer: Self-pay | Admitting: Pharmacist

## 2017-08-21 DIAGNOSIS — I255 Ischemic cardiomyopathy: Secondary | ICD-10-CM | POA: Diagnosis not present

## 2017-08-21 DIAGNOSIS — I2510 Atherosclerotic heart disease of native coronary artery without angina pectoris: Secondary | ICD-10-CM | POA: Diagnosis not present

## 2017-08-21 DIAGNOSIS — I472 Ventricular tachycardia, unspecified: Secondary | ICD-10-CM

## 2017-08-21 DIAGNOSIS — I5022 Chronic systolic (congestive) heart failure: Secondary | ICD-10-CM | POA: Diagnosis not present

## 2017-08-21 LAB — BASIC METABOLIC PANEL
ANION GAP: 13 (ref 5–15)
Anion gap: 14 (ref 5–15)
BUN: 31 mg/dL — AB (ref 6–20)
BUN: 30 mg/dL — ABNORMAL HIGH (ref 6–20)
CALCIUM: 9.6 mg/dL (ref 8.9–10.3)
CO2: 29 mmol/L (ref 22–32)
CO2: 28 mmol/L (ref 22–32)
Calcium: 9.7 mg/dL (ref 8.9–10.3)
Chloride: 95 mmol/L — ABNORMAL LOW (ref 101–111)
Chloride: 96 mmol/L — ABNORMAL LOW (ref 101–111)
Creatinine, Ser: 1.06 mg/dL — ABNORMAL HIGH (ref 0.44–1.00)
Creatinine, Ser: 1.02 mg/dL — ABNORMAL HIGH (ref 0.44–1.00)
GFR calc Af Amer: 55 mL/min — ABNORMAL LOW (ref >60)
GFR calc Af Amer: 58 mL/min — ABNORMAL LOW (ref >60)
GFR calc non Af Amer: 50 mL/min — ABNORMAL LOW (ref >60)
GFR, EST NON AFRICAN AMERICAN: 48 mL/min — AB (ref >60)
GLUCOSE: 132 mg/dL — AB (ref 65–99)
GLUCOSE: 146 mg/dL — AB (ref 65–99)
POTASSIUM: 2.9 mmol/L — AB (ref 3.5–5.1)
Potassium: 2.5 mmol/L — CL (ref 3.5–5.1)
SODIUM: 137 mmol/L (ref 135–145)
Sodium: 138 mmol/L (ref 135–145)

## 2017-08-21 LAB — CBC WITH DIFFERENTIAL/PLATELET
BASOS ABS: 0 10*3/uL (ref 0.0–0.1)
Basophils Relative: 0 %
EOS PCT: 5 %
Eosinophils Absolute: 0.4 10*3/uL (ref 0.0–0.7)
HCT: 40.3 % (ref 36.0–46.0)
Hemoglobin: 13.1 g/dL (ref 12.0–15.0)
Lymphocytes Relative: 31 %
Lymphs Abs: 2.5 10*3/uL (ref 0.7–4.0)
MCH: 30.8 pg (ref 26.0–34.0)
MCHC: 32.5 g/dL (ref 30.0–36.0)
MCV: 94.8 fL (ref 78.0–100.0)
Monocytes Absolute: 0.6 10*3/uL (ref 0.1–1.0)
Monocytes Relative: 7 %
NEUTROS PCT: 57 %
Neutro Abs: 4.6 10*3/uL (ref 1.7–7.7)
PLATELETS: 201 10*3/uL (ref 150–400)
RBC: 4.25 MIL/uL (ref 3.87–5.11)
RDW: 14.9 % (ref 11.5–15.5)
WBC: 8.1 10*3/uL (ref 4.0–10.5)

## 2017-08-21 LAB — PROTEIN ELECTRO, RANDOM URINE
ALBUMIN ELP UR: 0 %
ALPHA-1-GLOBULIN, U: 0 %
Alpha-2-Globulin, U: 0 %
Beta Globulin, U: 0 %
Gamma Globulin, U: 0 %

## 2017-08-21 LAB — MAGNESIUM: MAGNESIUM: 1.9 mg/dL (ref 1.7–2.4)

## 2017-08-21 LAB — POTASSIUM: Potassium: 4.2 mmol/L (ref 3.5–5.1)

## 2017-08-21 MED ORDER — ASPIRIN 81 MG PO CHEW: 81.0000 mg | CHEWABLE_TABLET | Freq: Every day | ORAL | 6 refills | Status: DC

## 2017-08-21 MED ORDER — POTASSIUM CHLORIDE CRYS ER 20 MEQ PO TBCR: 60.0000 meq | EXTENDED_RELEASE_TABLET | Freq: Once | ORAL | Status: DC

## 2017-08-21 MED ORDER — TORSEMIDE 20 MG PO TABS
80.0000 mg | ORAL_TABLET | Freq: Every day | ORAL | Status: DC
  Administered 2017-08-21: 80 mg via ORAL

## 2017-08-21 MED ORDER — TORSEMIDE 20 MG PO TABS: 40.0000 mg | ORAL_TABLET | Freq: Every day | ORAL | Status: DC

## 2017-08-21 MED ORDER — POTASSIUM CHLORIDE CRYS ER 20 MEQ PO TBCR
60.0000 meq | EXTENDED_RELEASE_TABLET | ORAL | Status: AC
  Administered 2017-08-21: 60 meq via ORAL
  Filled 2017-08-21: qty 3

## 2017-08-21 MED ORDER — ATORVASTATIN CALCIUM 40 MG PO TABS: 40.0000 mg | ORAL_TABLET | Freq: Every day | ORAL | 11 refills | Status: DC

## 2017-08-21 MED ORDER — CLOPIDOGREL BISULFATE 75 MG PO TABS: 75.0000 mg | ORAL_TABLET | Freq: Every day | ORAL | 6 refills | Status: DC

## 2017-08-21 MED ORDER — POTASSIUM CHLORIDE CRYS ER 20 MEQ PO TBCR
60.0000 meq | EXTENDED_RELEASE_TABLET | Freq: Once | ORAL | Status: AC
  Administered 2017-08-21: 60 meq via ORAL
  Filled 2017-08-21: qty 3

## 2017-08-21 NOTE — Progress Notes (Signed)
CRITICAL VALUE ALERT  Critical Value:  K=2.5  Date & Time Notied:  08/21/17 @ 3:55 AM  Provider Notified: Dr Raiford Simmonds  Orders Received/Actions taken: K-dur 60 meq po given as ordered. Repeat BMET in 3 hrs.

## 2017-08-21 NOTE — Patient Outreach (Signed)
[facility]) Care Management  [place]   08/21/2017  Bianca [name] 1932-11-27 [id number]  84 y.o. year old female referred to [place] for Medication Management (Pharmacy telephone outreach) and Transitions Of Care  Patient called for 30- day post-discharge medication reconciliation and medication management consult as she was recently hospitalized 3/5-08/21/17 direct admit from Advanced Heart Failure clinic for cardiac catheterization after it was noted that she had 4 episodes of ventricular tachycardia. PMHx includes, but not limited to, OSA on CPAP; afib on Eliquis; CAD s/p stent to LAD 08/20/17; combined chronic heart failure, last EF 20%, HTN and stage 3 CKD.    Patient with [employer] medicare advantage plan.    Patient's daughter in law, who manages all medications, confirms identity with HIPAA-identifiers x2 and gives verbal consent to speak over the phone about medications.    SUBJECTIVE:   Medication Adherence: No issues, daughter-in-law fills pill boxes weekly and patient is 100% adherent to medications. Picked up new strength of atorvastatin, new clopidogrel and new baby aspirin after discharge. Per daughter in law, potassium dose remained the same at discharge. Per daughter in law, spironolactone recently increased to 25 mg daily.   Medication Assistance:  Denies issues at present. Reports previous pharmacist, Grayland Ormond, spoke to her about plan for Eliquis if the patient hits the donut hole again.  Medication Management:  Reports no issues with medication changes. Deneis s/sx of bleeding and identifies s/sx of stroke. Denies chest pain, falls, syncope, dizziness since discharge or increase in spironolactone. Reports she did find sublingual nitroglycerin to be outdated and reports she is going to the pharmacy soon to get a refill. Reports she discussed change KCl from tablet to packet and they deferred for now. Daughter in law denies any  issues with swallowing. Daughter in law states they have plans to follow up with Dr. Haroldine Laws and will make an appointment this week.  OBJECTIVE: Estimated Creatinine Clearance: 49.1 mL/min (A) (by C-G formula based on SCr of 1.02 mg/dL (H)). BMET    Component Value Date/Time   NA 137 08/21/2017 0650   NA 142 08/12/2017 1602   K 4.2 08/21/2017 1129   CL 96 (L) 08/21/2017 0650   CO2 28 08/21/2017 0650   GLUCOSE 146 (H) 08/21/2017 0650   BUN 30 (H) 08/21/2017 0650   BUN 21 08/12/2017 1602   CREATININE 1.02 (H) 08/21/2017 0650   CREATININE 1.01 (H) 02/24/2016 1002   CALCIUM 9.7 08/21/2017 0650   GFRNONAA 50 (L) 08/21/2017 0650   GFRAA 58 (L) 08/21/2017 0650   Last weight = 207 lbs as of 08/21/2017  Encounter Medications: Outpatient Encounter Medications as of 08/21/2017  Medication Sig Note  . acetaminophen (TYLENOL) 650 MG CR tablet Take 1,300 mg by mouth 2 (two) times daily before a meal.   . allopurinol (ZYLOPRIM) 100 MG tablet Take 100 mg by mouth See admin instructions. Takes along with a 300 mg tablet to equal 400 mg    . allopurinol (ZYLOPRIM) 300 MG tablet Take 300 mg by mouth See admin instructions. Take with the 100mg  tablet to equal 400mg  daily    . apixaban (ELIQUIS) 5 MG TABS tablet Take 5 mg by mouth 2 (two) times daily.   Derrill Memo ON 08/22/2017] aspirin 81 MG chewable tablet Chew 1 tablet (81 mg total) by mouth daily.   Marland Kitchen atorvastatin (LIPITOR) 40 MG tablet Take 1 tablet (40 mg total) by mouth daily.   . Calcium Carbonate-Vit D-Min (CALCIUM/VITAMIN D/MINERALS)  600-200 MG-UNIT TABS Take 1 tablet by mouth 2 (two) times daily.    . Cholecalciferol (VITAMIN D3) 1000 UNITS CAPS Take 1,000 Units by mouth daily.    Derrill Memo ON 08/22/2017] clopidogrel (PLAVIX) 75 MG tablet Take 1 tablet (75 mg total) by mouth daily with breakfast.   . colestipol (COLESTID) 1 G tablet Take 2 g by mouth at bedtime.    . gabapentin (NEURONTIN) 400 MG capsule Take 1 capsule (400 mg total) by mouth 3  (three) times daily.   Marland Kitchen guaiFENesin (MUCINEX) 600 MG 12 hr tablet Take 1 tablet (600 mg total) by mouth 2 (two) times daily.   . isosorbide mononitrate (IMDUR) 30 MG 24 hr tablet Take 0.5 tablets (15 mg total) by mouth daily.   Marland Kitchen KLOR-CON M20 20 MEQ tablet TAKE 2 TABLETS (40 MEQ TOTAL) BY MOUTH 2 (TWO) TIMES DAILY.   . metolazone (ZAROXOLYN) 2.5 MG tablet Take 2.5-5 mg every Monday 08/14/2017: Take 2.5 mg once daily on Monday and an additional 2.5 mg on Thursday as needed for signs of fluid.  . metoprolol succinate (TOPROL-XL) 25 MG 24 hr tablet Take 1 tablet (25 mg total) by mouth daily.   . nitroGLYCERIN (NITROSTAT) 0.4 MG SL tablet Place 1 tablet (0.4 mg total) under the tongue every 5 (five) minutes as needed for chest pain.   . nortriptyline (PAMELOR) 25 MG capsule Take 25 mg by mouth at bedtime.   . OXYGEN Inhale 2 L/min into the lungs at bedtime as needed (uses as bedtime everynight and then occasionally iun the daytime as needed for SOB).  08/20/2017: Uses with cpap  . Probiotic Product (PROBIOTIC PO) Take 1 tablet by mouth daily.   Marland Kitchen senna-docusate (SENOKOT-S) 8.6-50 MG tablet Take 1 tablet by mouth 2 (two) times daily.  08/14/2017: As needed  . spironolactone (ALDACTONE) 25 MG tablet Take 1 tablet (25 mg total) by mouth daily.   Marland Kitchen torsemide (DEMADEX) 20 MG tablet Take 4 tablets (80 mg total) every morning and 2 tablets (40 mg total) six hours after morning dosage daily.    Facility-Administered Encounter Medications as of 08/21/2017  Medication  . 0.9 %  sodium chloride infusion  . 0.9 %  sodium chloride infusion  . acetaminophen (TYLENOL) tablet 650 mg  . acetaminophen (TYLENOL) tablet 650 mg  . allopurinol (ZYLOPRIM) tablet 400 mg  . apixaban (ELIQUIS) tablet 5 mg  . aspirin chewable tablet 81 mg  . atorvastatin (LIPITOR) tablet 10 mg  . clopidogrel (PLAVIX) tablet 75 mg  . colestipol (COLESTID) tablet 2 g  . gabapentin (NEURONTIN) capsule 400 mg  . guaiFENesin (MUCINEX) 12 hr  tablet 600 mg  . isosorbide mononitrate (IMDUR) 24 hr tablet 15 mg  . metoprolol succinate (TOPROL-XL) 24 hr tablet 25 mg  . nitroGLYCERIN (NITROSTAT) SL tablet 0.4 mg  . nortriptyline (PAMELOR) capsule 25 mg  . ondansetron (ZOFRAN) injection 4 mg  . potassium chloride SA (K-DUR,KLOR-CON) CR tablet 40 mEq  . senna-docusate (Senokot-S) tablet 1 tablet  . sodium chloride flush (NS) 0.9 % injection 3 mL  . sodium chloride flush (NS) 0.9 % injection 3 mL  . sodium chloride flush (NS) 0.9 % injection 3 mL  . sodium chloride flush (NS) 0.9 % injection 3 mL  . spironolactone (ALDACTONE) tablet 25 mg  . torsemide (DEMADEX) tablet 40 mg  . torsemide (DEMADEX) tablet 80 mg    Functional Status: In your present state of health, do you have any difficulty performing the following activities: 08/20/2017  08/20/2017  Hearing? [place]? N -  Difficulty concentrating or making decisions? N -  Comment - -  Walking or climbing stairs? Y -  Dressing or bathing? Y -  Doing errands, shopping? - Y  Preparing Food and eating ? - -  Using the Toilet? - -  In the past six months, have you accidently leaked urine? - -  Do you have problems with loss of bowel control? - -  Managing your Medications? - -  Comment - -  Managing your Finances? - -  Comment - -  Housekeeping or managing your Housekeeping? - -  Some recent data might be hidden    Fall/Depression Screening: Fall Risk  08/14/2017  Falls in the past year? Yes  Number falls in past yr: 2 or more  Injury with Fall? No  Risk Factor Category  High Fall Risk  Risk for fall due to : History of fall(s);Impaired balance/gait  Follow up Falls prevention discussed   PHQ 2/9 Scores 08/14/2017  PHQ - 2 Score 1   ASSESSMENT: Date Discharged from Hospital: 08/21/17 Date Medication Reconciliation Performed: 08/21/2017  New Medications at Discharge:   Clopidogrel  Aspirin  Atorvastatin increased   Patient was recently discharged from hospital and  all medications have been reviewed  Cardiovascular: Eliquis, atorvastatin, colestipol, Imdur, metolazone, metoprolol ER, Nitrostat, spironolactone, torsemide, clopidogrel, aspirin  Pulmonary/Allergy: guaifenesin  Gastrointestinal: Senokot  Pain: acetaminophen, gabapentin, nortriptyline  Vitamins/Minerals: Calcium + Vitamin D, cholecalciferol, Klor-con  Miscellaneous: allopurinol, Probiotic Duplications in therapy: none noted Gaps in therapy: none noted Medications to avoid in the elderly:  Amitriptyline, gabapentin - denies ADE suggestive of anticholinergic side effects Drug interactions:  Klor-con + nortriptyline: Anticholinergic agents, such as nortriptyline, may enhance the ulcerogenic effect of potassium. Note that per caregiver, patient does have difficulty with chronic dry mouth. Reports that patient currently takes her potassium with applesauce to help the tablets go down. Counsel regarding risks of gastric and intestinal irritation and ulceration. Discuss with caregiver the potential use of a potassium sprinkle capsule as an alternative to reduce this risk.  Colestid + cholecalciferol/metolazone/torsemide: Bile Acid Sequestrants may decrease the serum concentration of cholecalciferol, metolazone and torsemide. Caregiver states that patient takes the Colestid at bedtime and does not take any of these three medications within 3 hours of the Colestid. Other issues noted: nitroglycerin expired - daughter in law to refill, counseled on appropriate storage.   PLAN: -Instructed daughter in law to adhere to new medications as prescribed and adjust atorvastatin as prescribed. Daughter verbalizes understanding.  -No further pharmacy needs identified at this time -Provided with my contact information to follow up on medication assistance needs later in the year if desired.   Will close pharmacy case. Will route note to primary care provider.   Carlean Jews, Pharm.D., BCPS PGY2  Ambulatory Care Pharmacy Resident Phone: [phone]

## 2017-08-21 NOTE — Care Management Note (Signed)
Case Management Note  Patient Details  Name: Bianca [name] MRN: [id number] Date of Birth: May 18, 1933  Subjective/Objective:               Spoke w son who states patient is active w [facility] Referral placed to Linus Mako RN. Spoke w Blair Hailey who confirmed follow up w HF Clinic. No other CM needs.      Action/Plan:   Expected Discharge Date:  08/21/17               Expected Discharge Plan:  [place]  In-House Referral:     Discharge planning Services  CM Consult  Post Acute Care Choice:    Choice offered to:  Adult Children  DME Arranged:    DME Agency:     HH Arranged:  RN, PT [place] Agency:  [facility] (now [facility])  Status of Service:  Completed, signed off  If discussed at [place] of Stay Meetings, dates discussed:    Additional Comments:  Carles Collet, RN 08/21/2017, 11:57 AM

## 2017-08-21 NOTE — Progress Notes (Signed)
CARDIAC REHAB PHASE I   PRE:  Rate/Rhythm: 100 Vpaced  BP:  Supine: 112/79        SaO2: 94% RA  MODE:  Ambulation: 100 ft   POST:  Rate/Rhythm: 108 V paced  BP:  Sitting: 144/87      SaO2: 94% RA  [id number] Pt assisted to [facility] and then ambulated 100 ft with two assist and home rolling walker.  Limited by pain in knees and hip.  RN made aware.  Education provided including stent and HF to son and patient. HF NP in to see and request made for PT to see to facilitate HHPT. Per son, his wife is trying to get HHPT & OT with PCP.  CRP II Referral made to [place].  Noel Christmas, RN 08/21/2017 9:40 AM

## 2017-08-21 NOTE — Progress Notes (Addendum)
Advanced Heart Failure Rounding Note  PCP-Cardiologist: Candee Furbish, MD   Subjective:    LHC 3/5: s/p PTCA/DES x 1 to mid LAD   -1.9 liters on torsemide 90 mg daily. No weight this am. Creatinine stable. K 2.9. NSVT overnight.  No CP, SOB, dizziness, or bleeding. Ambulated with cardiac rehab, limited by knee pain.    Objective:   Weight Range: 207 lb 3.7 oz (94 kg) Body mass index is 33.45 kg/m.   Vital Signs:   Temp:  [97.3 F (36.3 C)-98.1 F (36.7 C)] 98 F (36.7 C) (03/06 0730) Pulse Rate:  [81-109] 100 (03/06 0730) Resp:  [7-25] 16 (03/06 0730) BP: (103-127)/(39-90) 122/85 (03/06 0730) SpO2:  [91 %-100 %] 92 % (03/06 0730) Weight:  [207 lb 3.7 oz (94 kg)] 207 lb 3.7 oz (94 kg) (03/06 0208) Last BM Date: 08/18/17  Weight change: Filed Weights   08/20/17 0712 08/21/17 0208  Weight: 206 lb (93.4 kg) 207 lb 3.7 oz (94 kg)    Intake/Output:   Intake/Output Summary (Last 24 hours) at 08/21/2017 0900 Last data filed at 08/21/2017 0750 Gross per 24 hour  Intake 799.17 ml  Output 2700 ml  Net -1900.83 ml      Physical Exam    General:  Elderly appearing. No resp difficulty HEENT: Normal Neck: Supple. JVP ~10-12. Carotids 2+ bilat; no bruits. No lymphadenopathy or thyromegaly appreciated. Cor: PMI nondisplaced. Regular rate & rhythm. No rubs, gallops or murmurs. Lungs: Fine crackles in bases Abdomen: Soft, nontender, nondistended. No hepatosplenomegaly. No bruits or masses. Good bowel sounds. Extremities: No cyanosis, clubbing, rash, trace ankle edema. Right wrist cath site CDI, soft. Neuro: Alert & orientedx3, cranial nerves grossly intact. moves all 4 extremities w/o difficulty. Affect pleasant   Telemetry   V-paced 90s with IVCD. 7-9 beat runs of slow NSVT. Personally reviewed.   EKG    A-sensed, V-paced with IVCD, HR 99. Personally reviewed.   Labs    CBC Recent Labs    08/19/17 1558 08/21/17 0251  WBC 7.6 8.1  NEUTROABS  --  4.6  HGB 13.5  13.1  HCT 41.6 40.3  MCV 95.2 94.8  PLT 208 161   Basic Metabolic Panel Recent Labs    08/21/17 0251 08/21/17 0650  NA 138 137  K 2.5* 2.9*  CL 95* 96*  CO2 29 28  GLUCOSE 132* 146*  BUN 31* 30*  CREATININE 1.06* 1.02*  CALCIUM 9.6 9.7   Liver Function Tests Recent Labs    08/19/17 1558  AST 27  ALT 17  ALKPHOS 82  BILITOT 1.0  PROT 7.4  ALBUMIN 4.2   No results for input(s): LIPASE, AMYLASE in the last 72 hours. Cardiac Enzymes No results for input(s): CKTOTAL, CKMB, CKMBINDEX, TROPONINI in the last 72 hours.  BNP: BNP (last 3 results) Recent Labs    01/01/17 1339 07/30/17 0842  BNP 975.5* 1,664.3*    ProBNP (last 3 results) No results for input(s): PROBNP in the last 8760 hours.   D-Dimer No results for input(s): DDIMER in the last 72 hours. Hemoglobin A1C No results for input(s): HGBA1C in the last 72 hours. Fasting Lipid Panel No results for input(s): CHOL, HDL, LDLCALC, TRIG, CHOLHDL, LDLDIRECT in the last 72 hours. Thyroid Function Tests Recent Labs    08/19/17 1558  TSH 5.291*    Other results:   Imaging     No results found.   Medications:     Scheduled Medications: . acetaminophen  650 mg  Oral BID AC  . allopurinol  400 mg Oral Daily  . apixaban  5 mg Oral BID  . aspirin  81 mg Oral Daily  . atorvastatin  10 mg Oral q1800  . clopidogrel  75 mg Oral Q breakfast  . colestipol  2 g Oral QHS  . gabapentin  400 mg Oral TID  . guaiFENesin  600 mg Oral BID  . isosorbide mononitrate  15 mg Oral Daily  . metoprolol succinate  25 mg Oral Daily  . nortriptyline  25 mg Oral QHS  . potassium chloride SA  40 mEq Oral Daily  . potassium chloride  60 mEq Oral Once  . senna-docusate  1 tablet Oral BID  . sodium chloride flush  3 mL Intravenous Q12H  . sodium chloride flush  3 mL Intravenous Q12H  . spironolactone  25 mg Oral Daily  . torsemide  90 mg Oral Daily     Infusions: . sodium chloride    . sodium chloride       PRN  Medications:  sodium chloride, sodium chloride, acetaminophen, nitroGLYCERIN, ondansetron (ZOFRAN) IV, sodium chloride flush, sodium chloride flush    Patient Profile   Bianca [name] is a 84 y.o. female with nonischemic cardiomyopathy, hx of atrial fibrillation-currently with chronic anticoagulation, hx of Chronic kidney disease-stage III, chronic systolic heart failure-status post Bi- ventricular ICD-followed by Dr. Lovena Le.   Seen in clinic 08/19/17 and noted to have 4 episode of VT treated with ATP. With decreased EF schedule for cath today which showed 95% prox LAD lesion.   Assessment/Plan   1. CAD s/p PTCA/DES to prox LAD lesion - s/p DES 08/20/17 - Will need ASA + Plavix x 1 month along with her Eliquis. Then would stop ASA after 1 month.  - She has not had any chest pain as part of her presentation.  - No s/s ischemia. Cath site CDI and soft.   2. Chronic systolic EF 65% s/p BiV ICD - [employer]. - NICM originally, now with mixed ICM component as above.  - Echo 11/2012 20-25% -> Med adjustments 5/2017EF recovered.50-55%. - Now back down to LVEF 20% as recently as onEcho 04/2017 LVEF 20%, Grade 2 DD.  - NYHA III symptoms, confounded by deconditioning and arthritis.  - Volume status elevated on exam.  - Resume her home diuretic regimen. She takes torsemide 80 mg q am, 40 mg q pm - Continue Toprol 25 mg daily.  - Continue spiro 25 mg daily.  - Consider stopping imdur and starting losartan.She had angioedema on Quinapril, so is not Entresto candidate.  - Reinforced fluid restriction to < 2 L daily, sodium restriction to less than 2000 mg daily, and the importance of daily weights. .   3. Paroxysmal AF -CHA2DS2-VASc 6.  - No AF on ICD interpretation 08/19/17. - Continue eliquis.   4. Morbid obesity with h/o CO2 retention - using oxygen at night.  - On RA during admit. Has not been shown to de-saturate with ambulation.No change.   5. R Flank Hematoma - 10/2015.  Stable. She still complaints of fullness and tenderness at this site. No change  6. VT - x 4 since Feb 4th, each time with successful ATP.  - Possible ischemic component with CAD as above.  - Will see EP as outpatient for anti-arrhythmic consideration. EP already consulted as outpatient - appointment not yet scheduled.  - NSVT overnight, HR 100's. Did not appear to require ATP - Mag added on this am. K 2.5 >  2.9. Supplement already ordered. Recheck at noon. - She was not previously taking potassium supplement at home. Will plan to start 40 meq daily at DC.   8. Deconditioning  - Worked with cardiac rehab, limited by arthritis - PT consulted  Medication concerns reviewed with patient and pharmacy team. Barriers identified: none   HF follow up is scheduled.   Length of Stay: [place], NP  08/21/2017, 9:00 AM  Advanced Heart Failure Team Pager [phone] (M-F; 7a - 4p)  Please contact [facility] for night-coverage after hours (4p -7a ) and weekends on [email or web address]  Patient seen and examined with the above-signed Advanced Practice Provider and/or Housestaff. I personally reviewed laboratory data, imaging studies and relevant notes. I independently examined the patient and formulated the important aspects of the plan. I have edited the note to reflect any of my changes or salient points. I have personally discussed the plan with the patient and/or family.  She is s/p stenting of high-grade mid LAD lesion yesterday. Had some brief NSVT overnight in setting of low K and mag which have now been supped. Feels good this am. We discussed the fact that I think her cardiomyopathy is out of proportion to her CAD. Will d/c today and will need close f/u in HF clinic to titrate HF meds. As she is now off hydralazine can likely stop Imdur.   Will use ASA 81, Eliquis and Plavix for 30 days. Stop ASA after 30 days and continue Eliquis/Plavix. Watch closely for bleeding or dark stools.   Glori Bickers, MD  1:58 PM

## 2017-08-21 NOTE — Evaluation (Signed)
Physical Therapy Evaluation Patient Details Name: Bianca [name] MRN: [id number] DOB: [birth year] Today's Date: 08/21/2017   History of Present Illness  Pt is an 84 y/o female withnonischemic cardiomyopathy, hx of atrial fibrillation-currently with chronic anticoagulation, hx of Chronic kidney disease-stage III, chronic systolic heart failure-status post Bi- ventricular ICD-followed by Dr. Lovena Le.Seen in clinic 08/19/17 and noted to have 4 episode of VT treated with ATP. With decreased EF schedule for cath today which showed 95% prox LAD lesion. Pt with CAD s/p PTCA/DES to prox LAD lesion.    Clinical Impression  Pt presented sitting OOB in recliner chair, awake and willing to participate in therapy session. Pt's son present throughout session as well. Prior to admission, pt reported that she ambulates with use of rollator at all times and can dress/bath herself. Pt lives in an [facility]). Pt currently requires mod A for transfers and min guard to ambulate in hallway with use of rollator. Pt would continue to benefit from skilled physical therapy services at this time while admitted and after d/c to address the below listed limitations in order to improve overall safety and independence with functional mobility.      Follow Up Recommendations Home health PT;Supervision/Assistance - 24 hour    Equipment Recommendations  None recommended by PT    Recommendations for Other Services       Precautions / Restrictions Precautions Precautions: Fall      Mobility  Bed Mobility               General bed mobility comments: pt OOB in recliner chair upon arrival  Transfers Overall transfer level: Needs assistance Equipment used: 4-wheeled walker Transfers: Sit to/from Stand Sit to Stand: Mod assist         General transfer comment: pt using momentum and requiring several attempts to achieve full standing position, mod A to power into full upright standing from recliner  chair; pt has lift chair at home  Ambulation/Gait Ambulation/Gait assistance: Min guard Ambulation Distance (Feet): 50 Feet Assistive device: 4-wheeled walker Gait Pattern/deviations: Step-through pattern;Trunk flexed Gait velocity: Decreased Gait velocity interpretation: <1.8 ft/sec, indicative of risk for recurrent falls General Gait Details: slow, steady gait with use of rollator; pt with no reports of dizziness or SOB  Stairs            Wheelchair Mobility    Modified Rankin (Stroke Patients Only)       Balance Overall balance assessment: Needs assistance Sitting-balance support: No upper extremity supported;Feet supported Sitting balance-Leahy Scale: Good     Standing balance support: Bilateral upper extremity supported;During functional activity Standing balance-Leahy Scale: Poor Standing balance comment: Reliant on UE support                             Pertinent Vitals/Pain Pain Assessment: No/denies pain    Home Living Family/patient expects to be discharged to:: Private residence Living Arrangements: Alone Available Help at Discharge: Family;Friend(s);Neighbor;Available 24 hours/day Type of Home: Apartment Home Access: Elevator     Home Layout: One level Home Equipment: Walker - 4 wheels;Shower seat;Grab bars - tub/shower;Grab bars - toilet;Wheelchair - manual;Cane - single point Additional Comments: has a lift chair    Prior Function Level of Independence: Independent with assistive device(s)   Gait / Transfers Assistance Needed: pt ambulates with rollator           Hand Dominance        Extremity/Trunk Assessment  Upper Extremity Assessment Upper Extremity Assessment: Generalized weakness    Lower Extremity Assessment Lower Extremity Assessment: Generalized weakness       Communication   Communication: HOH  Cognition Arousal/Alertness: Awake/alert Behavior During Therapy: WFL for tasks assessed/performed Overall  Cognitive Status: Within Functional Limits for tasks assessed                                        General Comments      Exercises     Assessment/Plan    PT Assessment Patient needs continued PT services  PT Problem List Decreased activity tolerance;Decreased balance;Decreased mobility;Decreased coordination       PT Treatment Interventions DME instruction;Gait training;Stair training;Functional mobility training;Therapeutic activities;Therapeutic exercise;Balance training;Neuromuscular re-education;Patient/family education    PT Goals (Current goals can be found in the Care Plan section)  Acute Rehab PT Goals Patient Stated Goal: return home PT Goal Formulation: With patient/family Time For Goal Achievement: 09/04/17    Frequency Min 3X/week   Barriers to discharge        Co-evaluation               AM-PAC PT "6 Clicks" Daily Activity  Outcome Measure Difficulty turning over in bed (including adjusting bedclothes, sheets and blankets)?: A Little Difficulty moving from lying on back to sitting on the side of the bed? : A Little Difficulty sitting down on and standing up from a chair with arms (e.g., wheelchair, bedside commode, etc,.)?: Unable Help needed moving to and from a bed to chair (including a wheelchair)?: A Little Help needed walking in hospital room?: A Little Help needed climbing 3-5 steps with a railing? : A Lot 6 Click Score: 15    End of Session Equipment Utilized During Treatment: Gait belt Activity Tolerance: Patient limited by fatigue Patient left: in chair;with call bell/phone within reach;with family/visitor present Nurse Communication: Mobility status PT Visit Diagnosis: Other abnormalities of gait and mobility (R26.89);Muscle weakness (generalized) (M62.81)    Time: 2725-3664 PT Time Calculation (min) (ACUTE ONLY): 15 min   Charges:   PT Evaluation $PT Eval Moderate Complexity: 1 Mod     PT G Codes:         [place], PT, DPT [place] 08/21/2017, 1:27 PM

## 2017-08-21 NOTE — Discharge Instructions (Addendum)

## 2017-08-21 NOTE — Discharge Summary (Addendum)
Advanced Heart Failure Team  Discharge Summary   Patient ID: Bianca [name] MRN: [id number], DOB/AGE: [birth year] 84 y.o. Admit date: 08/20/2017 D/C date:     08/21/2017   Primary Discharge Diagnoses:  1. CAD s/p PTCA/DES to prox LAD lesion - s/p DES 08/20/17 mid LAD - Will need ASA + Plavix x 1 month along with her Eliquis. Then would stop ASA after 1 month 09/20/2017.  - She has not had any chest pain as part of her presentation.  2. Chronic systolic EF 16% s/p BiV ICD - [employer]. - NICM originally, now with mixed ICM component as above.  - Echo 04/2017 LVEF 20%, Grade 2 DD.  3. Paroxysmal AF -CHA2DS2-VASc 6.  - NSR by EKG.  - No AF on ICD interpretation 08/19/17. -- On eliqus 5 mg twice a day.  4. Morbid obesity with h/o CO2 retention - using oxygen at night.  - Not on chronic 02. Has not been show to de-saturate with ambulation.No change. 5. R Flank Hematoma  6. VT - x 4 since Feb 4th, each time with successful ATP.     Hospital Course:  Bianca [name] is a 84 y.o. female with nonischemic cardiomyopathy, hx of atrial fibrillation-currently with chronic anticoagulation, hx of Chronic kidney disease-stage III, chronic systolic heart failure-status post Bi- ventricular ICD-followed by Dr. Lovena Le.   Seen in the HF Clinic 08/19/17 and noted to have 4 episode of VT treated with ATP. With decreased EF she was scheduled for cath today which showed 95% prox LAD lesion with DES to mid LAD. Observed over night with no issues. Plan to continue DAPT with aspirin, plavix along with her long term anti-coagulation. Plan to stop aspirin in 1 month.   Referred to [facility] for [place] and HHPT. Once she completes [facility] will plan to refer to outpatient cardiac rehab.  She will continue to be followed closely in the HF clinic and has follow up 09/03/2017.    Discharge Weight: 207 pounds.   Discharge Vitals: Blood pressure 122/85, pulse 100, temperature 98 F (36.7 C), temperature source Oral, resp. rate  16, height 5\' 6"  (1.676 m), weight 207 lb 3.7 oz (94 kg), SpO2 92 %.  Labs: Lab Results  Component Value Date   WBC 8.1 08/21/2017   HGB 13.1 08/21/2017   HCT 40.3 08/21/2017   MCV 94.8 08/21/2017   PLT 201 08/21/2017    Recent Labs  Lab 08/19/17 1558  08/21/17 0650  NA 140   < > 137  K 2.8*   < > 2.9*  CL 93*   < > 96*  CO2 30   < > 28  BUN 27*   < > 30*  CREATININE 1.28*   < > 1.02*  CALCIUM 9.9   < > 9.7  PROT 7.4  --   --   BILITOT 1.0  --   --   ALKPHOS 82  --   --   ALT 17  --   --   AST 27  --   --   GLUCOSE 125*   < > 146*   < > = values in this interval not displayed.   Lab Results  Component Value Date   CHOL 158 07/20/2014   HDL 38.60 (L) 07/20/2014   LDLCALC 73 09/30/2013   TRIG 332.0 (H) 07/20/2014   BNP (last 3 results) Recent Labs    01/01/17 1339 07/30/17 0842  BNP 975.5* 1,664.3*    ProBNP (last 3 results) No results for  input(s): PROBNP in the last 8760 hours.   Diagnostic Studies/Procedures   No results found.  Discharge Medications   Allergies as of 08/21/2017      Reactions   Quinapril Hcl Swelling   Tongue and throat   Tape Other (See Comments)   Plastic tape causes irritation.    Coreg [carvedilol]    Very sleepy    Tessalon [benzonatate] Swelling, Other (See Comments)   Capsule opened in mouth, not an allergy   Neosporin [neomycin-polymyxin-gramicidin] Rash      Medication List    TAKE these medications   acetaminophen 650 MG CR tablet Commonly known as:  TYLENOL Take 1,300 mg by mouth 2 (two) times daily before a meal. What changed:  Another medication with the same name was removed. Continue taking this medication, and follow the directions you see here.   allopurinol 300 MG tablet Commonly known as:  ZYLOPRIM Take 300 mg by mouth See admin instructions. Take with the 100mg  tablet to equal 400mg  daily   allopurinol 100 MG tablet Commonly known as:  ZYLOPRIM Take 100 mg by mouth See admin instructions. Takes  along with a 300 mg tablet to equal 400 mg   aspirin 81 MG chewable tablet Chew 1 tablet (81 mg total) by mouth daily. Start taking on:  08/22/2017   atorvastatin 40 MG tablet Commonly known as:  LIPITOR Take 1 tablet (40 mg total) by mouth daily. What changed:    medication strength  how much to take  when to take this   CALCIUM/VITAMIN D/MINERALS 600-200 MG-UNIT Tabs Take 1 tablet by mouth 2 (two) times daily.   clopidogrel 75 MG tablet Commonly known as:  PLAVIX Take 1 tablet (75 mg total) by mouth daily with breakfast. Start taking on:  08/22/2017   colestipol 1 g tablet Commonly known as:  COLESTID Take 2 g by mouth at bedtime.   ELIQUIS 5 MG Tabs tablet Generic drug:  apixaban Take 5 mg by mouth 2 (two) times daily.   gabapentin 400 MG capsule Commonly known as:  NEURONTIN Take 1 capsule (400 mg total) by mouth 3 (three) times daily.   guaiFENesin 600 MG 12 hr tablet Commonly known as:  MUCINEX Take 1 tablet (600 mg total) by mouth 2 (two) times daily.   isosorbide mononitrate 30 MG 24 hr tablet Commonly known as:  IMDUR Take 0.5 tablets (15 mg total) by mouth daily.   KLOR-CON M20 20 MEQ tablet Generic drug:  potassium chloride SA TAKE 2 TABLETS (40 MEQ TOTAL) BY MOUTH 2 (TWO) TIMES DAILY.   metolazone 2.5 MG tablet Commonly known as:  ZAROXOLYN Take 2.5-5 mg every Monday   metoprolol succinate 25 MG 24 hr tablet Commonly known as:  TOPROL-XL Take 1 tablet (25 mg total) by mouth daily.   nitroGLYCERIN 0.4 MG SL tablet Commonly known as:  NITROSTAT Place 1 tablet (0.4 mg total) under the tongue every 5 (five) minutes as needed for chest pain.   nortriptyline 25 MG capsule Commonly known as:  PAMELOR Take 25 mg by mouth at bedtime.   OXYGEN Inhale 2 L/min into the lungs at bedtime as needed (uses as bedtime everynight and then occasionally iun the daytime as needed for SOB).   PROBIOTIC PO Take 1 tablet by mouth daily.   senna-docusate 8.6-50 MG  tablet Commonly known as:  Senokot-S Take 1 tablet by mouth 2 (two) times daily.   spironolactone 25 MG tablet Commonly known as:  ALDACTONE Take 1 tablet (25 mg  total) by mouth daily.   torsemide 20 MG tablet Commonly known as:  DEMADEX Take 4 tablets (80 mg total) every morning and 2 tablets (40 mg total) six hours after morning dosage daily.   Vitamin D3 1000 units Caps Take 1,000 Units by mouth daily.       Disposition   The patient will be discharged in stable condition to home. Discharge Instructions    (HEART FAILURE PATIENTS) Call MD:  Anytime you have any of the following symptoms: 1) 3 pound weight gain in 24 hours or 5 pounds in 1 week 2) shortness of breath, with or without a dry hacking cough 3) swelling in the hands, feet or stomach 4) if you have to sleep on extra pillows at night in order to breathe.   Complete by:  As directed    Amb Referral to Cardiac Rehabilitation   Complete by:  As directed    Diagnosis:   PTCA Coronary Stents     Diet - low sodium heart healthy   Complete by:  As directed    Increase activity slowly   Complete by:  As directed    STOP any activity that causes chest pain, shortness of breath, dizziness, sweating, or exessive weakness   Complete by:  As directed      Follow-up Information    [facility] Follow up on 09/03/2017.   Specialty:  Cardiology Why:  at 2:30  [place] information: [address] [id number] [place] [phone]            Duration of Discharge Encounter: Greater than 35 minutes   Signed, Darrick Grinder NP-C  08/21/2017, 10:13 AM  Patient seen and examined with the above-signed Advanced Practice Provider and/or Housestaff. I personally reviewed laboratory data, imaging studies and relevant notes. I independently examined the patient and formulated the important aspects of the plan. I have edited the note to  reflect any of my changes or salient points. I have personally discussed the plan with the patient and/or family.  She is s/p stenting of high-grade mid LAD lesion yesterday. Had some brief NSVT overnight in setting of low K and mag which have now been supped. Feels good this am. We discussed the fact that I think her cardiomyopathy is out of proportion to her CAD. Will d/c today and will need close f/u in HF clinic to titrate HF meds. As she is now off hydralazine can likely stop Imdur.   Will use ASA 81, Eliquis and Plavix for 30 days. Stop ASA after 30 days and continue Eliquis/Plavix. Watch closely for bleeding or dark stools.   Glori Bickers, MD  1:58 PM

## 2017-08-22 ENCOUNTER — Other Ambulatory Visit: Payer: Self-pay

## 2017-08-22 ENCOUNTER — Encounter (HOSPITAL_COMMUNITY): Payer: Self-pay

## 2017-08-22 ENCOUNTER — Telehealth (HOSPITAL_COMMUNITY): Payer: Self-pay | Admitting: *Deleted

## 2017-08-22 ENCOUNTER — Telehealth (HOSPITAL_COMMUNITY): Payer: Self-pay

## 2017-08-22 ENCOUNTER — Emergency Department (HOSPITAL_COMMUNITY): Payer: PPO

## 2017-08-22 ENCOUNTER — Inpatient Hospital Stay (HOSPITAL_COMMUNITY)
Admission: EM | Admit: 2017-08-22 | Discharge: 2017-08-30 | DRG: 246 | Disposition: A | Discharge status: Skilled Nursing Facility | Payer: PPO | Attending: Cardiology | Admitting: Cardiology

## 2017-08-22 DIAGNOSIS — R7989 Other specified abnormal findings of blood chemistry: Secondary | ICD-10-CM

## 2017-08-22 DIAGNOSIS — K7290 Hepatic failure, unspecified without coma: Secondary | ICD-10-CM

## 2017-08-22 DIAGNOSIS — R079 Chest pain, unspecified: Secondary | ICD-10-CM | POA: Diagnosis present

## 2017-08-22 DIAGNOSIS — R778 Other specified abnormalities of plasma proteins: Secondary | ICD-10-CM

## 2017-08-22 DIAGNOSIS — R0602 Shortness of breath: Secondary | ICD-10-CM

## 2017-08-22 DIAGNOSIS — I5022 Chronic systolic (congestive) heart failure: Secondary | ICD-10-CM

## 2017-08-22 DIAGNOSIS — R058 Other specified cough: Secondary | ICD-10-CM

## 2017-08-22 DIAGNOSIS — K7682 Hepatic encephalopathy: Secondary | ICD-10-CM

## 2017-08-22 DIAGNOSIS — R05 Cough: Secondary | ICD-10-CM

## 2017-08-22 LAB — URINALYSIS, ROUTINE W REFLEX MICROSCOPIC
Bilirubin Urine: NEGATIVE
Glucose, UA: NEGATIVE mg/dL
Hgb urine dipstick: NEGATIVE
Ketones, ur: NEGATIVE mg/dL
Leukocytes, UA: NEGATIVE
Nitrite: NEGATIVE
Protein, ur: NEGATIVE mg/dL
Specific Gravity, Urine: 1.008 (ref 1.005–1.030)
pH: 7 (ref 5.0–8.0)

## 2017-08-22 LAB — BASIC METABOLIC PANEL
Anion gap: 13 (ref 5–15)
BUN: 33 mg/dL — ABNORMAL HIGH (ref 6–20)
CALCIUM: 9.4 mg/dL (ref 8.9–10.3)
CO2: 26 mmol/L (ref 22–32)
CREATININE: 1.13 mg/dL — AB (ref 0.44–1.00)
Chloride: 98 mmol/L — ABNORMAL LOW (ref 101–111)
GFR calc Af Amer: 51 mL/min — ABNORMAL LOW (ref >60)
GFR calc non Af Amer: 44 mL/min — ABNORMAL LOW (ref >60)
GLUCOSE: 108 mg/dL — AB (ref 65–99)
Potassium: 3.3 mmol/L — ABNORMAL LOW (ref 3.5–5.1)
Sodium: 137 mmol/L (ref 135–145)

## 2017-08-22 LAB — I-STAT TROPONIN, ED
Troponin i, poc: 0.11 ng/mL (ref 0.00–0.08)
Troponin i, poc: 0.11 ng/mL (ref 0.00–0.08)

## 2017-08-22 LAB — CBC
HCT: 39.2 % (ref 36.0–46.0)
Hemoglobin: 12.6 g/dL (ref 12.0–15.0)
MCH: 30.7 pg (ref 26.0–34.0)
MCHC: 32.1 g/dL (ref 30.0–36.0)
MCV: 95.6 fL (ref 78.0–100.0)
PLATELETS: 189 10*3/uL (ref 150–400)
RBC: 4.1 MIL/uL (ref 3.87–5.11)
RDW: 14.9 % (ref 11.5–15.5)
WBC: 8.9 10*3/uL (ref 4.0–10.5)

## 2017-08-22 LAB — BRAIN NATRIURETIC PEPTIDE: B Natriuretic Peptide: 962 pg/mL — ABNORMAL HIGH (ref 0.0–100.0)

## 2017-08-22 MED ORDER — POTASSIUM CHLORIDE CRYS ER 20 MEQ PO TBCR
20.0000 meq | EXTENDED_RELEASE_TABLET | Freq: Once | ORAL | Status: AC
  Administered 2017-08-23: 20 meq via ORAL
  Filled 2017-08-22: qty 1

## 2017-08-22 MED ORDER — FUROSEMIDE 10 MG/ML IJ SOLN
40.0000 mg | Freq: Once | INTRAMUSCULAR | Status: AC
  Administered 2017-08-22: 40 mg via INTRAVENOUS
  Filled 2017-08-22: qty 4

## 2017-08-22 NOTE — ED Notes (Signed)
Elevated I-stat trop reported to [place]-RN@NF 

## 2017-08-22 NOTE — ED Provider Notes (Signed)
[facility] EMERGENCY DEPARTMENT Provider Note   CSN: [id number] Arrival date & time: 08/22/17  1724     History   Chief Complaint No chief complaint on file.   HPI Bianca [name] is a 84 y.o. female.  HPI  Patient is an 84 year old female with a history of heart failure with reduced ejection fraction (with ICD in place), CAD (status post stent on 08/20/17 to LAD), MI, atrial fibrillation ( on Eliquis) presenting for palpitations, chest pain, shortness of breath.  Patient reports her palpitations have increased in frequency recently.  ICD has never fired for patient.  Patient reports that today she was feeling "off" without particular symptoms of syncope, presyncope or nausea.  Patient reports that on the way to the emergency department, she developed central chest pain that developed gradually.  Patient typically requires 2 L of oxygen at night for comfort, however required 2 L of oxygen in the emergency department to keep saturations above 92%.  Patient takes torsemide 20 mg daily, and she has not missed any doses.  Patient has not had any increase in her dry weight, she weighs herself daily.  Patient has not noted any increase in swelling of the legs or feet.  Patient denies any fevers, chills, or increased productive cough.  Past Medical History:  Diagnosis Date  . Acute blood loss anemia   . AICD (automatic cardioverter/defibrillator) present   . Arthritis    "back, feet, legs" (08/20/2017)  . Basal cell carcinoma    "cut off scalp; burned off face" (08/20/2017)  . CAD (coronary artery disease)/LBBB 01/01/2017  . CHF (congestive heart failure) ([place])   . Chronic lower back pain   . Chronic systolic heart failure ([place])   . CKD (chronic kidney disease), stage III ([place]) 12/22/2015  . Complication of anesthesia ~ 2015   "quit breathing during colonoscopy" (08/20/2017)  . Depression 11/24/2015  . Gout    "on daily RX" (08/20/2017)  . High cholesterol   . History of blood  transfusion 10/2015   "S/P fall w/right side hematoma"  . Hypertension   . Hypokalemia   . Myocardial infarction ([place]) 07/2006; 11/2006  . Neuromuscular disorder ([place])   . On home oxygen therapy    "2L at night w/CPAP" (08/20/2017)  . OSA on CPAP   . Paroxysmal atrial fibrillation (HCC)   . Peripheral neuropathy   . Physical deconditioning   . Pneumonia    "once" (08/20/2017)  . Presence of permanent cardiac pacemaker   . Right flank hematoma 10/2015    Patient Active Problem List   Diagnosis Date Noted  . Ischemic cardiomyopathy   . Acute respiratory failure with hypoxia ([place]) 07/30/2017  . Recurrent UTI 07/30/2017  . Altered mental status 07/30/2017  . Obstructive sleep apnea on CPAP 07/30/2017  . Yeast dermatitis 07/30/2017  . Hypokalemia 07/30/2017  . Atrial fibrillation, transient ([place]) 07/30/2017  . Arthritis of right knee/neuropathy 07/30/2017  . Class 1 obesity with body mass index (BMI) of 32.0 to 32.9 in adult 07/30/2017  . Dyslipidemia 07/30/2017  . CKD (chronic kidney disease), symptom management only, stage 3 (moderate) ([place]) 07/30/2017  . Hypertension 01/01/2017  . Sleep apnea 01/01/2017  . CAD (coronary artery disease)/LBBB 01/01/2017  . History of ventricular tachycardia 01/01/2017  . PAF (paroxysmal atrial fibrillation) ([place]) 01/01/2017  . CKD (chronic kidney disease) stage 3, GFR 30-59 ml/min (HCC) 01/01/2017  . Peripheral neuropathy 01/01/2017  . OSA on CPAP 01/01/2017  . Obesity (BMI 30-39.9) 01/01/2017  .  ICD (implantable cardioverter-defibrillator) in place 01/01/2017  . Dyspnea   . Ventricular tachycardia ([place])   . Subclinical hypothyroidism 12/25/2015  . CKD (chronic kidney disease), stage III ([place]) 12/22/2015  . Hypoxia 12/22/2015  . Hypoxemia 12/21/2015  . CHF exacerbation ([place]) 11/24/2015  . Physical deconditioning 11/24/2015  . Hyperglycemia 11/24/2015  . Peripheral neuropathy 11/24/2015  . HLD (hyperlipidemia) 11/24/2015  . Depression  11/24/2015  . Chest pain 11/24/2015  . Neuropathy involving both lower extremities   . Paroxysmal atrial fibrillation (HCC)   . OSA (obstructive sleep apnea)   . E. coli UTI   . Subcutaneous hematoma   . Fall 10/25/2015  . Afib ([place]) 01/19/2015  . Long-term (current) use of anticoagulants 07/20/2014  . Persistent atrial fibrillation ([place]) 07/20/2014  . Encounter for therapeutic drug monitoring 07/16/2013  . Chronic systolic heart failure ([place]) 07/16/2013  . History of implantable cardioverter-defibrillator (ICD) placement 07/16/2013  . Morbid obesity ([place]) 07/16/2013  . Chronic anticoagulation 07/16/2013  . Knee osteoarthritis 07/16/2013  . HYPOKALEMIA 08/09/2010  . Neuropathy ([place]) 08/09/2010  . Chronic diastolic (congestive) heart failure ([place]) 08/09/2010  . Automatic implantable cardioverter-defibrillator in situ 08/09/2010    Past Surgical History:  Procedure Laterality Date  . ACHILLES TENDON SURGERY Bilateral    "stretched"  . BASAL CELL CARCINOMA EXCISION  2017   "scalp"  . BREAST CYST EXCISION Left 1983  . CARDIAC CATHETERIZATION  07/2006; 08/21/2017  . CATARACT EXTRACTION, BILATERAL Bilateral   . CHOLECYSTECTOMY OPEN    . CORONARY STENT INTERVENTION N/A 08/20/2017   Procedure: CORONARY STENT INTERVENTION;  Surgeon: Burnell Blanks, MD;  Location: [facility];  Service: Cardiovascular;  Laterality: N/A;  . EP IMPLANTABLE DEVICE N/A 02/29/2016   Procedure: BIV ICD Generator Changeout;  Surgeon: Evans Lance, MD;  Location: [facility];  Service: Cardiovascular;  Laterality: N/A;  . INSERT / REPLACE / REMOVE PACEMAKER  01/2007  . KNEE LIGAMENT RECONSTRUCTION  2012  . RIGHT/LEFT HEART CATH AND CORONARY ANGIOGRAPHY N/A 08/20/2017   Procedure: RIGHT/LEFT HEART CATH AND CORONARY ANGIOGRAPHY;  Surgeon: Jolaine Artist, MD;  Location: [facility];  Service: Cardiovascular;  Laterality: N/A;  . TONSILLECTOMY    . TOTAL ABDOMINAL HYSTERECTOMY       OB History    No data available       Home Medications    Prior to Admission medications   Medication Sig Start Date End Date Taking? Authorizing Provider  acetaminophen (TYLENOL) 650 MG CR tablet Take 1,300 mg by mouth 2 (two) times daily before a meal.   Yes [provider]  allopurinol (ZYLOPRIM) 100 MG tablet Take 100 mg by mouth See admin instructions. Takes along with a 300 mg tablet to equal 400 mg    Yes [provider]  allopurinol (ZYLOPRIM) 300 MG tablet Take 300 mg by mouth See admin instructions. Take with the 100mg  tablet to equal 400mg  daily    Yes [provider]  apixaban (ELIQUIS) 5 MG TABS tablet Take 5 mg by mouth 2 (two) times daily.   Yes [provider]  aspirin 81 MG chewable tablet Chew 1 tablet (81 mg total) by mouth daily. 08/22/17  Yes Clegg, Amy D, NP  atorvastatin (LIPITOR) 40 MG tablet Take 1 tablet (40 mg total) by mouth daily. 08/21/17 08/21/18 Yes Clegg, Amy D, NP  Calcium Carbonate-Vit D-Min (CALCIUM/VITAMIN D/MINERALS) 600-200 MG-UNIT TABS Take 1 tablet by mouth 2 (two) times daily.    Yes [provider]  Cholecalciferol (VITAMIN D3) 1000 UNITS CAPS Take 1,000 Units by mouth daily.    Yes [provider]  clopidogrel (PLAVIX) 75 MG tablet Take 1 tablet (75 mg total) by mouth daily with breakfast. 08/22/17  Yes Clegg, Amy D, NP  colestipol (COLESTID) 1 G tablet Take 2 g by mouth at bedtime.    Yes [provider]  gabapentin (NEURONTIN) 400 MG capsule Take 1 capsule (400 mg total) by mouth 3 (three) times daily. 08/02/17  Yes Sheikh, Omair Latif, DO  guaiFENesin (MUCINEX) 600 MG 12 hr tablet Take 1 tablet (600 mg total) by mouth 2 (two) times daily. 11/29/15  Yes Theodis Blaze, MD  isosorbide mononitrate (IMDUR) 30 MG 24 hr tablet Take 0.5 tablets (15 mg total) by mouth daily. 08/12/17 11/10/17 Yes Isaiah Serge, NP  KLOR-CON M20 20 MEQ tablet TAKE 2 TABLETS (40 MEQ TOTAL) BY MOUTH 2 (TWO) TIMES  DAILY. 06/28/17  Yes Isaiah Serge, NP  metolazone (ZAROXOLYN) 2.5 MG tablet Take 2.5-5 mg every Monday 08/12/17  Yes Isaiah Serge, NP  metoprolol succinate (TOPROL-XL) 25 MG 24 hr tablet Take 1 tablet (25 mg total) by mouth daily. 07/18/17  Yes Jerline Pain, MD  nitroGLYCERIN (NITROSTAT) 0.4 MG SL tablet Place 1 tablet (0.4 mg total) under the tongue every 5 (five) minutes as needed for chest pain. 11/24/12  Yes Jettie Booze, MD  nortriptyline (PAMELOR) 25 MG capsule Take 25 mg by mouth at bedtime.   Yes [provider]  OXYGEN Inhale 2 L/min into the lungs at bedtime as needed (uses as bedtime everynight and then occasionally iun the daytime as needed for SOB).    Yes [provider]  Probiotic Product (PROBIOTIC PO) Take 1 tablet by mouth daily.   Yes [provider]  senna-docusate (SENOKOT-S) 8.6-50 MG tablet Take 1 tablet by mouth 2 (two) times daily as needed for mild constipation.    Yes [provider]  spironolactone (ALDACTONE) 25 MG tablet Take 1 tablet (25 mg total) by mouth daily. 08/19/17  Yes Bensimhon, Shaune Pascal, MD  torsemide (DEMADEX) 20 MG tablet Take 4 tablets (80 mg total) every morning and 2 tablets (40 mg total) six hours after morning dosage daily. 02/11/17  Yes Jerline Pain, MD    Family History Family History  Problem Relation Age of Onset  . Stroke Mother   . Congestive Heart Failure Mother   . Tuberculosis Father   . Diabetes Brother   . Stroke Brother   . Cancer Brother   . Heart attack Son     Social History Social History   Tobacco Use  . Smoking status: Former Smoker    Packs/day: 0.12    Years: 4.00    Pack years: 0.48    Types: Cigarettes  . Smokeless tobacco: Never Used  . Tobacco comment: 'quit smoking in the 1970s"  Substance Use Topics  . Alcohol use: No  . Drug use: No     Allergies   Quinapril hcl; Tape; Coreg [carvedilol]; Tessalon [benzonatate]; and Neosporin  [neomycin-polymyxin-gramicidin]   Review of Systems Review of Systems  Constitutional: Negative for chills, fever and unexpected weight change.  HENT: Negative for congestion and rhinorrhea.   Respiratory: Positive for chest tightness and shortness of breath. Negative for cough and wheezing.   Cardiovascular: Positive for chest pain. Negative for leg swelling.  Gastrointestinal: Negative for abdominal pain, nausea and vomiting.  Genitourinary: Negative for dysuria.  Neurological: Negative for  syncope.  All other systems reviewed and are negative.    Physical Exam Updated Vital Signs BP (!) 116/99 (BP Location: Left Arm)   Pulse 95   Temp (!) 97.3 F (36.3 C) (Oral)   Resp 18   LMP  (LMP Unknown)   SpO2 96%   Physical Exam  Constitutional: She appears well-developed and well-nourished. No distress.  Frail-appearing elderly female sitting comfortably in bed.  HENT:  Head: Normocephalic and atraumatic.  Mouth/Throat: Oropharynx is clear and moist.  Eyes: Conjunctivae and EOM are normal. Pupils are equal, round, and reactive to light.  Neck: Normal range of motion. Neck supple.  Cardiovascular: Normal rate, regular rhythm, S1 normal and S2 normal.  Hyperdynamic precordium noted.  No murmur, gallop, or rub noted.  1+, nonpitting lower extremity edema. 2+ DP pulses bilaterally.  Pulmonary/Chest: Effort normal and breath sounds normal. She has no wheezes. She has no rales.  Abdominal: Soft. She exhibits no distension. There is no tenderness. There is no guarding.  Musculoskeletal: Normal range of motion. She exhibits no edema or deformity.  Lymphadenopathy:    She has no cervical adenopathy.  Neurological: She is alert.  Cranial nerves grossly intact. Patient moves extremities symmetrically and with good coordination.  Skin: Skin is warm and dry. No rash noted. No erythema.  Psychiatric: She has a normal mood and affect. Her behavior is normal. Judgment and thought content  normal.  Nursing note and vitals reviewed.    ED Treatments / Results  Labs (all labs ordered are listed, but only abnormal results are displayed) Labs Reviewed  BASIC METABOLIC PANEL - Abnormal; Notable for the following components:      Result Value   Potassium 3.3 (*)    Chloride 98 (*)    Glucose, Bld 108 (*)    BUN 33 (*)    Creatinine, Ser 1.13 (*)    GFR calc non Af Amer 44 (*)    GFR calc Af Amer 51 (*)    All other components within normal limits  I-STAT TROPONIN, ED - Abnormal; Notable for the following components:   Troponin i, poc 0.11 (*)    All other components within normal limits  CBC  BRAIN NATRIURETIC PEPTIDE  I-STAT TROPONIN, ED    EKG  EKG Interpretation  Date/Time:  Thursday August 22 2017 17:30:11 EST Ventricular Rate:  99 PR Interval:  106 QRS Duration: 178 QT Interval:  458 QTC Calculation: 587 R Axis:   -116 Text Interpretation:  Atrial-sensed ventricular-paced rhythm Abnormal ECG No significant change since last tracing Confirmed by Zenovia Jarred [phone]) on 08/22/2017 7:15:53 PM       Radiology Dg Chest 2 View  Result Date: 08/22/2017 CLINICAL DATA:  Pt reports she had a heart catheterization on 08/22/17 and states mid chest pain started today, a few hours ago. Pt reports area is tender to palpation; reports intermittent palpitations. Hx of HTN, CAD, and CHF. Former smoker. EXAM: CHEST - 2 VIEW COMPARISON:  07/23/2017 FINDINGS: Patient has LEFT-sided transvenous pacemaker with leads to the RIGHT atrium, RIGHT ventricle, and coronary sinus. The heart is mildly enlarged. There are prominent interstitial markings consistent with interstitial edema. No overt alveolar edema or consolidations. No pleural effusions. IMPRESSION: Cardiomegaly and mild interstitial edema. Electronically Signed   By: Nolon Nations M.D.   On: 08/22/2017 18:19    Procedures Procedures (including critical care time)  Medications Ordered in ED Medications - No data to  display   Initial Impression / Assessment and Plan /  ED Course  I have reviewed the triage vital signs and the nursing notes.  Pertinent labs & imaging results that were available during my care of the patient were reviewed by me and considered in my medical decision making (see chart for details).     Patient is nontoxic-appearing, afebrile, and comfortable at rest.  Patient is status post DES to the LAD 2 days ago.  Find ACS unlikely given that patient has clean coronaries at this time, and is appropriately taking antiplatelets and anticoagulation.  Also doubt pulmonary embolism, as patient is on Eliquis, and only missed 2 doses just prior to her catheterization 2 days ago.  Patient exhibits a elevated troponin of 0.11.  This is likely secondary to her stent versus demand ischemia secondary to heart failure exacerbation.  Given the new onset oxygen requirement in the setting of palpitations and chest discomfort, do feel that heart failure exacerbation is most likely at this time.  Patient has not had an increase in dry weight, however her chest x-rays demonstrate an cardiomegaly with interstitial edema.    We will give 1 dose of IV Lasix equal to patient's home dose, and admit to cardiology.  Patient and family are in understanding and agree with plan of care.  Spoke with Dr. Phylliss Bob of cardiology who will admit the patient.   Final Clinical Impressions(s) / ED Diagnoses   Final diagnoses:  Central chest pain  Elevated troponin    ED Discharge Orders    None       Tamala Julian 08/22/17 2347    Macarthur Critchley, MD 08/23/17 0010

## 2017-08-22 NOTE — ED Provider Notes (Signed)
Patient placed in Quick Look pathway, seen and evaluated   Chief Complaint: chest pain  HPI:   84 y.o. female brought to the ED by family members with c/o chest pain. When ask where the pain is the patient points to the epigastric area. The pain started this afternoon. Patient reports having a cardiac cath and echo 2 days ago and and having a blockage. Patient's family called the doctor today and was told to come to the ED.  ROS: Cardiac: chest pain  Physical Exam:  BP (!) 116/99 (BP Location: Left Arm)   Pulse 95   Temp (!) 97.3 F (36.3 C) (Oral)   Resp 18   LMP  (LMP Unknown)   SpO2 96%    Gen: No distress  Neuro: Awake and Alert  Skin: Warm and dry  Lungs: no wheezing or rales heard  Chest: increased pain with deep breath   Focused Exam:    Initiation of care has begun. The patient has been counseled on the process, plan, and necessity for staying for the completion/evaluation, and the remainder of the medical screening examination    Ashley Murrain, NP 08/22/17 1739    Duffy Bruce, MD 08/23/17 [phone]

## 2017-08-22 NOTE — ED Triage Notes (Addendum)
Pt presents with onset of epigastric pain that began this afternoon.  Pt was discharged from [facility] yesterday with blockage reported from cath and echo on Tuesday. Pt reports area is tender to palpation; reports intermittent palpitations.

## 2017-08-22 NOTE — Telephone Encounter (Signed)
Patients insurance is active and benefits verified through [facility] - $15.00 co-pay, no deductible, out of pocket amount of $3,400/$81.98 has been met, no co-insurance, and no pre-authorization is required. Spoke with [employer] - Reference [id number]  Patient will be contacted and scheduled upon review by the RN Navigator.

## 2017-08-22 NOTE — Patient Outreach (Signed)
[facility]) Care Management  08/22/2017  EARLY ORD 12/24/1932 [id number]  Subjective: client denies pain, but states she is not at her best.  Objective: none  Assessment:  84 year old with recent admission 2/12-2/15 with confusion, heart failure exacerbation, UTI. 2 hospitalizations and 3 Emergency room visits in the past six months. Client hospitalized overnight following cardiac catheterization and stent placement.  RNCM called and spoke with client who reports she is not at her best and is now resting.   RNCM followed with a call to client's daughter in law, Carlicia Leavens. Medications reviewed. No questions or concerns voiced. She reports she has spoken with home health [facility] and nursing and physical therapy will start care. She confirms follow up appointment with heart failure clinic. Per Ms. Maldonado, her primary care is requesting client follow up with advanced heart failure clinic at this time.  RNCM confirmed that she has RNCM's contact number. Encouraged to call as needed.  Plan: follow up next week.  Thea Silversmith, RN, MSN, [place] Coordinator Cell: [phone]

## 2017-08-22 NOTE — Telephone Encounter (Signed)
Patient's daughter called stating that patient has started not feeling well today and having chest pain. Patient had a cath done earlier this week.  She also felt her Heart was racing, daughter checked it while we were on the phone and it was 103.  I explained that since she isn't feeling well and having active chest discomfort she needs to take her to the ER.  She agrees and no further questions.

## 2017-08-23 ENCOUNTER — Other Ambulatory Visit: Payer: Self-pay

## 2017-08-23 DIAGNOSIS — N179 Acute kidney failure, unspecified: Secondary | ICD-10-CM | POA: Diagnosis present

## 2017-08-23 DIAGNOSIS — R410 Disorientation, unspecified: Secondary | ICD-10-CM | POA: Diagnosis not present

## 2017-08-23 DIAGNOSIS — Z7901 Long term (current) use of anticoagulants: Secondary | ICD-10-CM | POA: Diagnosis not present

## 2017-08-23 DIAGNOSIS — Z955 Presence of coronary angioplasty implant and graft: Secondary | ICD-10-CM | POA: Diagnosis not present

## 2017-08-23 DIAGNOSIS — Z9981 Dependence on supplemental oxygen: Secondary | ICD-10-CM | POA: Diagnosis not present

## 2017-08-23 DIAGNOSIS — I5022 Chronic systolic (congestive) heart failure: Secondary | ICD-10-CM | POA: Diagnosis not present

## 2017-08-23 DIAGNOSIS — R002 Palpitations: Secondary | ICD-10-CM

## 2017-08-23 DIAGNOSIS — I5043 Acute on chronic combined systolic (congestive) and diastolic (congestive) heart failure: Secondary | ICD-10-CM | POA: Diagnosis not present

## 2017-08-23 DIAGNOSIS — Z79899 Other long term (current) drug therapy: Secondary | ICD-10-CM | POA: Diagnosis not present

## 2017-08-23 DIAGNOSIS — I5023 Acute on chronic systolic (congestive) heart failure: Secondary | ICD-10-CM | POA: Diagnosis present

## 2017-08-23 DIAGNOSIS — I471 Supraventricular tachycardia: Secondary | ICD-10-CM | POA: Diagnosis present

## 2017-08-23 DIAGNOSIS — E785 Hyperlipidemia, unspecified: Secondary | ICD-10-CM | POA: Diagnosis present

## 2017-08-23 DIAGNOSIS — I2510 Atherosclerotic heart disease of native coronary artery without angina pectoris: Secondary | ICD-10-CM | POA: Diagnosis present

## 2017-08-23 DIAGNOSIS — Z515 Encounter for palliative care: Secondary | ICD-10-CM | POA: Diagnosis not present

## 2017-08-23 DIAGNOSIS — Z7189 Other specified counseling: Secondary | ICD-10-CM | POA: Diagnosis not present

## 2017-08-23 DIAGNOSIS — R0602 Shortness of breath: Secondary | ICD-10-CM | POA: Diagnosis present

## 2017-08-23 DIAGNOSIS — Z87891 Personal history of nicotine dependence: Secondary | ICD-10-CM | POA: Diagnosis not present

## 2017-08-23 DIAGNOSIS — Z85828 Personal history of other malignant neoplasm of skin: Secondary | ICD-10-CM | POA: Diagnosis not present

## 2017-08-23 DIAGNOSIS — E7800 Pure hypercholesterolemia, unspecified: Secondary | ICD-10-CM | POA: Diagnosis present

## 2017-08-23 DIAGNOSIS — Z7982 Long term (current) use of aspirin: Secondary | ICD-10-CM | POA: Diagnosis not present

## 2017-08-23 DIAGNOSIS — Z833 Family history of diabetes mellitus: Secondary | ICD-10-CM | POA: Diagnosis not present

## 2017-08-23 DIAGNOSIS — R079 Chest pain, unspecified: Secondary | ICD-10-CM | POA: Diagnosis not present

## 2017-08-23 DIAGNOSIS — I472 Ventricular tachycardia: Secondary | ICD-10-CM | POA: Diagnosis present

## 2017-08-23 DIAGNOSIS — G4733 Obstructive sleep apnea (adult) (pediatric): Secondary | ICD-10-CM | POA: Diagnosis present

## 2017-08-23 DIAGNOSIS — Z95810 Presence of automatic (implantable) cardiac defibrillator: Secondary | ICD-10-CM | POA: Diagnosis not present

## 2017-08-23 DIAGNOSIS — I130 Hypertensive heart and chronic kidney disease with heart failure and stage 1 through stage 4 chronic kidney disease, or unspecified chronic kidney disease: Secondary | ICD-10-CM | POA: Diagnosis present

## 2017-08-23 DIAGNOSIS — Z8249 Family history of ischemic heart disease and other diseases of the circulatory system: Secondary | ICD-10-CM | POA: Diagnosis not present

## 2017-08-23 DIAGNOSIS — R748 Abnormal levels of other serum enzymes: Secondary | ICD-10-CM | POA: Diagnosis not present

## 2017-08-23 DIAGNOSIS — N390 Urinary tract infection, site not specified: Secondary | ICD-10-CM | POA: Diagnosis present

## 2017-08-23 DIAGNOSIS — I480 Paroxysmal atrial fibrillation: Secondary | ICD-10-CM | POA: Diagnosis present

## 2017-08-23 DIAGNOSIS — I255 Ischemic cardiomyopathy: Secondary | ICD-10-CM | POA: Diagnosis present

## 2017-08-23 DIAGNOSIS — R0789 Other chest pain: Secondary | ICD-10-CM | POA: Diagnosis not present

## 2017-08-23 DIAGNOSIS — I252 Old myocardial infarction: Secondary | ICD-10-CM | POA: Diagnosis not present

## 2017-08-23 LAB — BASIC METABOLIC PANEL
Anion gap: 16 — ABNORMAL HIGH (ref 5–15)
BUN: 29 mg/dL — ABNORMAL HIGH (ref 6–20)
CO2: 27 mmol/L (ref 22–32)
Calcium: 9.5 mg/dL (ref 8.9–10.3)
Chloride: 98 mmol/L — ABNORMAL LOW (ref 101–111)
Creatinine, Ser: 1.07 mg/dL — ABNORMAL HIGH (ref 0.44–1.00)
GFR calc Af Amer: 54 mL/min — ABNORMAL LOW (ref >60)
GFR calc non Af Amer: 47 mL/min — ABNORMAL LOW (ref >60)
Glucose, Bld: 110 mg/dL — ABNORMAL HIGH (ref 65–99)
Potassium: 2.7 mmol/L — CL (ref 3.5–5.1)
Sodium: 141 mmol/L (ref 135–145)

## 2017-08-23 LAB — MAGNESIUM: Magnesium: 1.8 mg/dL (ref 1.7–2.4)

## 2017-08-23 LAB — TROPONIN I: Troponin I: 0.13 ng/mL (ref <0.03)

## 2017-08-23 MED ORDER — ATORVASTATIN CALCIUM 40 MG PO TABS
40.0000 mg | ORAL_TABLET | Freq: Every day | ORAL | Status: DC
  Administered 2017-08-23 – 2017-08-30 (×8): 40 mg via ORAL
  Filled 2017-08-23 (×8): qty 1

## 2017-08-23 MED ORDER — SPIRONOLACTONE 25 MG PO TABS
25.0000 mg | ORAL_TABLET | Freq: Every day | ORAL | Status: DC
  Administered 2017-08-23 – 2017-08-25 (×3): 25 mg via ORAL
  Filled 2017-08-23 (×3): qty 1

## 2017-08-23 MED ORDER — ENSURE ENLIVE PO LIQD: 237.0000 mL | Freq: Two times a day (BID) | ORAL | Status: DC

## 2017-08-23 MED ORDER — CHLORHEXIDINE GLUCONATE 0.12 % MT SOLN
15.0000 mL | Freq: Two times a day (BID) | OROMUCOSAL | Status: DC
  Administered 2017-08-23 – 2017-08-30 (×14): 15 mL via OROMUCOSAL
  Filled 2017-08-23 (×16): qty 15

## 2017-08-23 MED ORDER — VITAMIN D 1000 UNITS PO TABS
1000.0000 [IU] | ORAL_TABLET | Freq: Every day | ORAL | Status: DC
  Administered 2017-08-23 – 2017-08-30 (×8): 1000 [IU] via ORAL
  Filled 2017-08-23 (×8): qty 1

## 2017-08-23 MED ORDER — GABAPENTIN 400 MG PO CAPS
400.0000 mg | ORAL_CAPSULE | Freq: Three times a day (TID) | ORAL | Status: DC
  Administered 2017-08-23 – 2017-08-26 (×11): 400 mg via ORAL
  Filled 2017-08-23 (×12): qty 1

## 2017-08-23 MED ORDER — NORTRIPTYLINE HCL 25 MG PO CAPS
25.0000 mg | ORAL_CAPSULE | Freq: Every day | ORAL | Status: DC
  Administered 2017-08-23 – 2017-08-29 (×7): 25 mg via ORAL
  Filled 2017-08-23 (×9): qty 1

## 2017-08-23 MED ORDER — TORSEMIDE 20 MG PO TABS
60.0000 mg | ORAL_TABLET | Freq: Every day | ORAL | Status: DC
  Administered 2017-08-23: 60 mg via ORAL
  Filled 2017-08-23: qty 3

## 2017-08-23 MED ORDER — POTASSIUM CHLORIDE CRYS ER 20 MEQ PO TBCR
40.0000 meq | EXTENDED_RELEASE_TABLET | Freq: Two times a day (BID) | ORAL | Status: DC
  Administered 2017-08-23 – 2017-08-25 (×5): 40 meq via ORAL
  Filled 2017-08-23 (×5): qty 2

## 2017-08-23 MED ORDER — APIXABAN 5 MG PO TABS
5.0000 mg | ORAL_TABLET | Freq: Two times a day (BID) | ORAL | Status: DC
  Administered 2017-08-23 – 2017-08-30 (×16): 5 mg via ORAL
  Filled 2017-08-23 (×17): qty 1

## 2017-08-23 MED ORDER — TORSEMIDE 20 MG PO TABS
80.0000 mg | ORAL_TABLET | Freq: Every day | ORAL | Status: DC
  Administered 2017-08-23 – 2017-08-24 (×2): 80 mg via ORAL
  Filled 2017-08-23 (×2): qty 4

## 2017-08-23 MED ORDER — MAGNESIUM SULFATE 2 GM/50ML IV SOLN
2.0000 g | Freq: Once | INTRAVENOUS | Status: AC
  Administered 2017-08-23: 2 g via INTRAVENOUS
  Filled 2017-08-23: qty 50

## 2017-08-23 MED ORDER — SENNOSIDES-DOCUSATE SODIUM 8.6-50 MG PO TABS
1.0000 | ORAL_TABLET | Freq: Two times a day (BID) | ORAL | Status: DC | PRN
  Administered 2017-08-24 – 2017-08-26 (×3): 1 via ORAL
  Filled 2017-08-23 (×3): qty 1

## 2017-08-23 MED ORDER — FUROSEMIDE 10 MG/ML IJ SOLN
40.0000 mg | Freq: Once | INTRAMUSCULAR | Status: AC
  Administered 2017-08-23: 40 mg via INTRAVENOUS
  Filled 2017-08-23: qty 4

## 2017-08-23 MED ORDER — METOPROLOL SUCCINATE ER 25 MG PO TB24
25.0000 mg | ORAL_TABLET | Freq: Every day | ORAL | Status: DC
  Administered 2017-08-23 – 2017-08-30 (×8): 25 mg via ORAL
  Filled 2017-08-23 (×8): qty 1

## 2017-08-23 MED ORDER — ALLOPURINOL 300 MG PO TABS: 300.0000 mg | ORAL_TABLET | ORAL | Status: DC

## 2017-08-23 MED ORDER — CLOPIDOGREL BISULFATE 75 MG PO TABS
75.0000 mg | ORAL_TABLET | Freq: Every day | ORAL | Status: DC
  Administered 2017-08-23 – 2017-08-30 (×8): 75 mg via ORAL
  Filled 2017-08-23 (×8): qty 1

## 2017-08-23 MED ORDER — ACETAMINOPHEN 325 MG PO TABS
650.0000 mg | ORAL_TABLET | ORAL | Status: DC | PRN
  Administered 2017-08-24 – 2017-08-29 (×3): 650 mg via ORAL
  Filled 2017-08-23 (×3): qty 2

## 2017-08-23 MED ORDER — ASPIRIN 81 MG PO CHEW
81.0000 mg | CHEWABLE_TABLET | Freq: Every day | ORAL | Status: DC
  Administered 2017-08-23 – 2017-08-30 (×8): 81 mg via ORAL
  Filled 2017-08-23 (×8): qty 1

## 2017-08-23 MED ORDER — ONDANSETRON HCL 4 MG/2ML IJ SOLN
4.0000 mg | Freq: Four times a day (QID) | INTRAMUSCULAR | Status: DC | PRN
  Administered 2017-08-24 – 2017-08-25 (×4): 4 mg via INTRAVENOUS
  Filled 2017-08-23 (×4): qty 2

## 2017-08-23 MED ORDER — ISOSORBIDE MONONITRATE ER 30 MG PO TB24
15.0000 mg | ORAL_TABLET | Freq: Every day | ORAL | Status: DC
  Administered 2017-08-23 – 2017-08-30 (×8): 15 mg via ORAL
  Filled 2017-08-23 (×9): qty 1

## 2017-08-23 MED ORDER — ALLOPURINOL 100 MG PO TABS
400.0000 mg | ORAL_TABLET | Freq: Every day | ORAL | Status: DC
  Administered 2017-08-23 – 2017-08-30 (×8): 400 mg via ORAL
  Filled 2017-08-23 (×8): qty 4

## 2017-08-23 MED ORDER — ORAL CARE MOUTH RINSE
15.0000 mL | Freq: Two times a day (BID) | OROMUCOSAL | Status: DC
  Administered 2017-08-23 – 2017-08-30 (×14): 15 mL via OROMUCOSAL

## 2017-08-23 MED ORDER — COLESTIPOL HCL 1 G PO TABS
2.0000 g | ORAL_TABLET | Freq: Every day | ORAL | Status: DC
  Administered 2017-08-23 – 2017-08-29 (×8): 2 g via ORAL
  Filled 2017-08-23 (×9): qty 2

## 2017-08-23 NOTE — Progress Notes (Signed)
Pt's Troponin 0.13, no active CP, MD notified and aware, will continue to monitor, Thanks Arvella Nigh RN.

## 2017-08-23 NOTE — Progress Notes (Signed)
ANTICOAGULATION CONSULT NOTE - Initial Consult  Pharmacy Consult for Apixaban  Indication: atrial fibrillation  Allergies  Allergen Reactions  . Quinapril Hcl Swelling    Tongue and throat  . Tape Other (See Comments)    Plastic tape causes irritation.   . Coreg [Carvedilol]     Very sleepy   . Tessalon [Benzonatate] Swelling and Other (See Comments)    Capsule opened in mouth, not an allergy  . Neosporin [Neomycin-Polymyxin-Gramicidin] Rash    Patient Measurements: Height: 5\' 6"  (167.6 cm) Weight: 214 lb 8 oz (97.3 kg)(b scale) IBW/kg (Calculated) : 59.3   Vital Signs: Temp: 97.9 F (36.6 C) (03/08 0526) Temp Source: Oral (03/08 0526) BP: 120/88 (03/08 0526) Pulse Rate: 93 (03/08 0526)  Labs: Recent Labs    08/21/17 0251 08/21/17 0650 08/22/17 1745 08/23/17 0358  HGB 13.1  --  12.6  --   HCT 40.3  --  39.2  --   PLT 201  --  189  --   CREATININE 1.06* 1.02* 1.13*  --   TROPONINI  --   --   --  0.13*    Estimated Creatinine Clearance: 45.1 mL/min (A) (by C-G formula based on SCr of 1.13 mg/dL (H)).   Medical History: Past Medical History:  Diagnosis Date  . Acute blood loss anemia   . AICD (automatic cardioverter/defibrillator) present   . Arthritis    "back, feet, legs" (08/20/2017)  . Basal cell carcinoma    "cut off scalp; burned off face" (08/20/2017)  . CAD (coronary artery disease)/LBBB 01/01/2017  . CHF (congestive heart failure) ([place])   . Chronic lower back pain   . Chronic systolic heart failure ([place])   . CKD (chronic kidney disease), stage III ([place]) 12/22/2015  . Complication of anesthesia ~ 2015   "quit breathing during colonoscopy" (08/20/2017)  . Depression 11/24/2015  . Gout    "on daily RX" (08/20/2017)  . High cholesterol   . History of blood transfusion 10/2015   "S/P fall w/right side hematoma"  . Hypertension   . Hypokalemia   . Myocardial infarction ([place]) 07/2006; 11/2006  . Neuromuscular disorder ([employer])   . On home oxygen therapy    "2L  at night w/CPAP" (08/20/2017)  . OSA on CPAP   . Paroxysmal atrial fibrillation (HCC)   . Peripheral neuropathy   . Physical deconditioning   . Pneumonia    "once" (08/20/2017)  . Presence of permanent cardiac pacemaker   . Right flank hematoma 10/2015      Assessment: 3yof s/p PCI of LAD earlier this week admitted with CP but trop flat, ST, Hx Afib CVR on toprol.  Continue apixaban 5mg  BID.  Dose ok.   EF 20% volume up s/p torsemide pta furos iv 40 x2  Goal of Therapy:   Monitor platelets by anticoagulation protocol: Yes   Plan:  Apixaban 5mg  BID Monitor S/S bleeding Monitor renal function  Bonnita Nasuti Pharm.D. CPP, BCPS Clinical Pharmacist [phone] 08/23/2017 11:04 AM

## 2017-08-23 NOTE — Care Management Note (Signed)
Case Management Note  Patient Details  Name: Bianca [name] MRN: [id number] Date of Birth: [birth year]  Subjective/Objective:   Chest Pain                Action/Plan: Patient lives at [place]; is active with [facility] for [facility] / PT; Stanton Kidney with [facility] called and made aware of admission; CM will continue to follow for progression of care.  Expected Discharge Date:    possibly 08/26/2017              Expected Discharge Plan:  [place]  Discharge planning Services  CM Consult   HH Arranged:  RN, PT, Disease Management [place] Agency:  [facility] (formerly [facility])  Status of Service:  In process, will continue to follow  [name] [phone] 08/23/2017, 1:18 PM

## 2017-08-23 NOTE — ED Notes (Addendum)
Awaiting admission. Family at bedside. No complaints.

## 2017-08-23 NOTE — Progress Notes (Signed)
Nutrition Brief Note  Patient identified on the Malnutrition Screening Tool (MST) Report and consult for assessment of nutritional status and needs.   Wt Readings from Last 15 Encounters:  08/23/17 214 lb 8 oz (97.3 kg)  08/21/17 207 lb 3.7 oz (94 kg)  08/19/17 220 lb 8 oz (100 kg)  08/14/17 209 lb (94.8 kg)  08/12/17 220 lb 1.9 oz (99.8 kg)  08/02/17 215 lb 9.8 oz (97.8 kg)  07/28/17 200 lb (90.7 kg)  07/18/17 210 lb (95.3 kg)  06/07/17 217 lb (98.4 kg)  03/28/17 219 lb (99.3 kg)  02/19/17 224 lb 12.8 oz (102 kg)  01/15/17 233 lb 9.6 oz (106 kg)  01/08/17 231 lb (104.8 kg)  01/08/17 225 lb (102.1 kg)  01/04/17 225 lb (102.1 kg)   Bianca [name] is a 84 y.o. female with history of nonischemic cardiomyopathy (EF 20% status post bi-V ICD), coronary artery disease status post DES to the mid LAD on 08/20/2017, atrial fibrillation on Eliquis, who presents with an episode of chest pain and palpitations  Pt admitted with chest pain.   Spoke with pt and daughter at bedside. Pt is currently NPO. Both reports that pt generally has a good appetite, however, pt with decreased appetite over the past 1-2 days due to feeling unwell. Pt receives 3 meals per day at independent living facility (Breakfast: bacon, egg, and cheese croissant, Lunch: chicken tenders, rice, stewed tomatoes and okra, and Dinner: meat, starch, and vegetable). Pt daughter endorses a 50# wt loss over the past 2 years, which she attributes to following a low sodium diet and following a more healthful diet. Pt also avoids high fiber, high fat foods secondary to colitis diagnosis.   Nutrition-Focused physical exam completed. Findings are no fat depletion, no muscle depletion, and moderate edema.   Body mass index is 34.62 kg/m. Patient meets criteria for obesity, class I based on current BMI.   Current diet order is NPO, patient is consuming approximately n/a% of meals at this time. Labs and medications reviewed.   No nutrition  interventions warranted at this time. If nutrition issues arise, please consult RD.   Dinora Hemm A. Jimmye Norman, RD, LDN, CDE Pager: [phone] After hours Pager: [phone]

## 2017-08-23 NOTE — Progress Notes (Signed)
Pt has home CPAP for tonight.  RT added water and place CPAP on pt.

## 2017-08-23 NOTE — Clinical Social Work Note (Signed)
CSW acknowledges consult that patient is from a facility. Patient is from [place]. No social work needs at this time.  CSW signing off. Consult again if any social work needs arise.  Dayton Scrape, [place]

## 2017-08-23 NOTE — Plan of Care (Signed)
  Progressing Clinical Measurements: Ability to maintain clinical measurements within normal limits will improve 08/23/2017 0820 - Progressing by Ruben Im, RN Will remain free from infection 08/23/2017 0820 - Progressing by Ruben Im, RN Diagnostic test results will improve 08/23/2017 0820 - Progressing by Ruben Im, RN Respiratory complications will improve 08/23/2017 0820 - Progressing by Ruben Im, RN Cardiovascular complication will be avoided 08/23/2017 0820 - Progressing by Ruben Im, RN Activity: Risk for activity intolerance will decrease 08/23/2017 0820 - Progressing by Ruben Im, RN Nutrition: Adequate nutrition will be maintained 08/23/2017 0820 - Progressing by Ruben Im, RN Coping: Level of anxiety will decrease 08/23/2017 0820 - Progressing by Ruben Im, RN Elimination: Will not experience complications related to bowel motility 08/23/2017 0820 - Progressing by Ruben Im, RN Will not experience complications related to urinary retention 08/23/2017 0820 - Progressing by Ruben Im, RN Pain Managment: General experience of comfort will improve 08/23/2017 0820 - Progressing by Ruben Im, RN Safety: Ability to remain free from injury will improve 08/23/2017 0820 - Progressing by Ruben Im, RN Skin Integrity: Risk for impaired skin integrity will decrease 08/23/2017 0820 - Progressing by Ruben Im, RN

## 2017-08-23 NOTE — ED Notes (Signed)
Patient denies pain and is resting comfortably.  

## 2017-08-23 NOTE — Consult Note (Addendum)
[facility] Inpatient Consult   08/23/2017  Bianca [name] 1933/01/06 [id number]  Patient is currently active with [facility] for chronic disease management services.  Patient is in the [employer] plan.  Patient has been engaged by a [employer] and a HX with [employer].    Our community based plan of care has focused on disease management and community resource support. Patient was outreached on 08/22/17 and had been seen in HF clinic on 08/19/17. Patient evaluated for HF and atrial fib with chest tightness and palpitations.  Met with the patient at the bedside.  Explained [facility]'s role in community care and disease management. Patient not feeling well. Patient consents to ongoing follow up.  Patient will receive a post discharge transition of care call and will be evaluated for monthly home visits for assessments and disease process education.   Made Inpatient Case Manager aware that [facility] following. Of note, [facility] Care Management services does not replace or interfere with any services that are needed or arranged by inpatient case management or social work.  For additional questions or referrals please contact:  Natividad Brood, RN BSN Trainer Hospital Liaison  [phone] business mobile phone Toll free office [phone]

## 2017-08-23 NOTE — ED Notes (Signed)
Patient denies pain and is resting comfortably. Denies chest pain. Foley bag emptied-760ml clear urine.
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
## 2017-08-23 NOTE — Care Management Obs Status (Signed)
[facility] NOTIFICATION   Patient Details  Name: Bianca [name] MRN: [id number] Date of Birth: [birth year]   Medicare Observation Status Notification Given:  Yes    Carles Collet, RN 08/23/2017, 9:13 AM

## 2017-08-23 NOTE — H&P (Signed)
Cardiology History & Physical    Patient ID: Bianca [name] MRN: [id number], DOB: [birth year] Date of Encounter: 08/23/2017, 12:48 AM Primary Physician: Maurice Small, MD  Chief Complaint: Chest pain   HPI: Bianca [name] is a 84 y.o. female with history of nonischemic cardiomyopathy (EF 20% status post bi-V ICD), coronary artery disease status post DES to the mid LAD on 08/20/2017, atrial fibrillation on Eliquis, who presents with an episode of chest pain and palpitations.  Patient underwent right and left heart catheterization on 08/20/2017 in response to a recent ICD interrogation that showed multiple runs of VT, 1 of which was terminated by antitachycardia pacing.  The right heart cath was notable for a CVP of 4, PA pressure of 31, a wedge pressure of 21, and a cardiac index of 1.9.  She had a 95% stenosis in her mid LAD, which was ultimately stented with a 3.0x 59mm DES, with excellent angiographic result.  The patient was doing well following discharge until this afternoon, when she woke up from a nap and had palpitations, which was followed by a sensation of chest discomfort and generalized fatigue.  She did not have any ICD shocks or lightheadedness with this episode.  Her volume status has been well managed, and currently she is at 207 pounds.  Her typical dry weights run in the range of 209- 212 pounds.  Given her chest discomfort, she was brought into the [facility] emergency department by her family members.  In the ED, her initial vital signs were limits.  Her EKG showed an atrially sensed, biventricular paced rhythm unchanged from prior ECGs.  Her creatinine was 1.13, which is at her current baseline.  Her troponin I was 0.11 initially and stable at 0.11 upon a 3-hour repeat check.  Given her chest pressure, palpitations and concern for possible arrhythmic trigger for her symptoms, she was admitted to the cardiology service for further management.  Upon my interview, patient was chest  pain-free.  Past Medical History:  Diagnosis Date  . Acute blood loss anemia   . AICD (automatic cardioverter/defibrillator) present   . Arthritis    "back, feet, legs" (08/20/2017)  . Basal cell carcinoma    "cut off scalp; burned off face" (08/20/2017)  . CAD (coronary artery disease)/LBBB 01/01/2017  . CHF (congestive heart failure) ([place])   . Chronic lower back pain   . Chronic systolic heart failure ([place])   . CKD (chronic kidney disease), stage III ([place]) 12/22/2015  . Complication of anesthesia ~ 2015   "quit breathing during colonoscopy" (08/20/2017)  . Depression 11/24/2015  . Gout    "on daily RX" (08/20/2017)  . High cholesterol   . History of blood transfusion 10/2015   "S/P fall w/right side hematoma"  . Hypertension   . Hypokalemia   . Myocardial infarction ([place]) 07/2006; 11/2006  . Neuromuscular disorder ([place])   . On home oxygen therapy    "2L at night w/CPAP" (08/20/2017)  . OSA on CPAP   . Paroxysmal atrial fibrillation ([facility])   . Peripheral neuropathy   . Physical deconditioning   . Pneumonia    "once" (08/20/2017)  . Presence of permanent cardiac pacemaker   . Right flank hematoma 10/2015     Surgical History:  Past Surgical History:  Procedure Laterality Date  . ACHILLES TENDON SURGERY Bilateral    "stretched"  . BASAL CELL CARCINOMA EXCISION  2017   "scalp"  . BREAST CYST EXCISION Left 1983  . CARDIAC CATHETERIZATION  07/2006; 08/21/2017  . CATARACT EXTRACTION, BILATERAL Bilateral   . CHOLECYSTECTOMY OPEN    . CORONARY STENT INTERVENTION N/A 08/20/2017   Procedure: CORONARY STENT INTERVENTION;  Surgeon: Burnell Blanks, MD;  Location: [facility];  Service: Cardiovascular;  Laterality: N/A;  . EP IMPLANTABLE DEVICE N/A 02/29/2016   Procedure: BIV ICD Generator Changeout;  Surgeon: Evans Lance, MD;  Location: [facility];  Service: Cardiovascular;  Laterality: N/A;  . INSERT / REPLACE / REMOVE PACEMAKER  01/2007  . KNEE LIGAMENT RECONSTRUCTION   2012  . RIGHT/LEFT HEART CATH AND CORONARY ANGIOGRAPHY N/A 08/20/2017   Procedure: RIGHT/LEFT HEART CATH AND CORONARY ANGIOGRAPHY;  Surgeon: Jolaine Artist, MD;  Location: [facility];  Service: Cardiovascular;  Laterality: N/A;  . TONSILLECTOMY    . TOTAL ABDOMINAL HYSTERECTOMY       Home Meds: Prior to Admission medications   Medication Sig Start Date End Date Taking? Authorizing Provider  acetaminophen (TYLENOL) 650 MG CR tablet Take 1,300 mg by mouth 2 (two) times daily before a meal.   Yes [provider]  allopurinol (ZYLOPRIM) 100 MG tablet Take 100 mg by mouth See admin instructions. Takes along with a 300 mg tablet to equal 400 mg    Yes [provider]  allopurinol (ZYLOPRIM) 300 MG tablet Take 300 mg by mouth See admin instructions. Take with the 100mg  tablet to equal 400mg  daily    Yes [provider]  apixaban (ELIQUIS) 5 MG TABS tablet Take 5 mg by mouth 2 (two) times daily.   Yes [provider]  aspirin 81 MG chewable tablet Chew 1 tablet (81 mg total) by mouth daily. 08/22/17  Yes Clegg, Amy D, NP  atorvastatin (LIPITOR) 40 MG tablet Take 1 tablet (40 mg total) by mouth daily. 08/21/17 08/21/18 Yes Clegg, Amy D, NP  Calcium Carbonate-Vit D-Min (CALCIUM/VITAMIN D/MINERALS) 600-200 MG-UNIT TABS Take 1 tablet by mouth 2 (two) times daily.    Yes [provider]  Cholecalciferol (VITAMIN D3) 1000 UNITS CAPS Take 1,000 Units by mouth daily.    Yes [provider]  clopidogrel (PLAVIX) 75 MG tablet Take 1 tablet (75 mg total) by mouth daily with breakfast. 08/22/17  Yes Clegg, Amy D, NP  colestipol (COLESTID) 1 G tablet Take 2 g by mouth at bedtime.    Yes [provider]  gabapentin (NEURONTIN) 400 MG capsule Take 1 capsule (400 mg total) by mouth 3 (three) times daily. 08/02/17  Yes Sheikh, Omair Latif, DO  guaiFENesin (MUCINEX) 600 MG 12 hr tablet Take 1 tablet (600 mg total) by mouth 2 (two) times daily. 11/29/15  Yes  Theodis Blaze, MD  isosorbide mononitrate (IMDUR) 30 MG 24 hr tablet Take 0.5 tablets (15 mg total) by mouth daily. 08/12/17 11/10/17 Yes Isaiah Serge, NP  KLOR-CON M20 20 MEQ tablet TAKE 2 TABLETS (40 MEQ TOTAL) BY MOUTH 2 (TWO) TIMES DAILY. 06/28/17  Yes Isaiah Serge, NP  metolazone (ZAROXOLYN) 2.5 MG tablet Take 2.5-5 mg every Monday 08/12/17  Yes Isaiah Serge, NP  metoprolol succinate (TOPROL-XL) 25 MG 24 hr tablet Take 1 tablet (25 mg total) by mouth daily. 07/18/17  Yes Jerline Pain, MD  nitroGLYCERIN (NITROSTAT) 0.4 MG SL tablet Place 1 tablet (0.4 mg total) under the tongue every 5 (five) minutes as needed for chest pain. 11/24/12  Yes Jettie Booze, MD  nortriptyline (PAMELOR) 25 MG capsule Take 25 mg by mouth at bedtime.  Yes [provider]  OXYGEN Inhale 2 L/min into the lungs at bedtime as needed (uses as bedtime everynight and then occasionally iun the daytime as needed for SOB).    Yes [provider]  Probiotic Product (PROBIOTIC PO) Take 1 tablet by mouth daily.   Yes [provider]  senna-docusate (SENOKOT-S) 8.6-50 MG tablet Take 1 tablet by mouth 2 (two) times daily as needed for mild constipation.    Yes [provider]  spironolactone (ALDACTONE) 25 MG tablet Take 1 tablet (25 mg total) by mouth daily. 08/19/17  Yes Bensimhon, Shaune Pascal, MD  torsemide (DEMADEX) 20 MG tablet Take 4 tablets (80 mg total) every morning and 2 tablets (40 mg total) six hours after morning dosage daily. 02/11/17  Yes Jerline Pain, MD    Allergies:  Allergies  Allergen Reactions  . Quinapril Hcl Swelling    Tongue and throat  . Tape Other (See Comments)    Plastic tape causes irritation.   . Coreg [Carvedilol]     Very sleepy   . Tessalon [Benzonatate] Swelling and Other (See Comments)    Capsule opened in mouth, not an allergy  . Neosporin [Neomycin-Polymyxin-Gramicidin] Rash    Social History   Socioeconomic History  . Marital status:  Widowed    Spouse name: Not on file  . Number of children: Not on file  . Years of education: Not on file  . Highest education level: Not on file  Social Needs  . Financial resource strain: Not on file  . Food insecurity - worry: Not on file  . Food insecurity - inability: Not on file  . Transportation needs - medical: Not on file  . Transportation needs - non-medical: Not on file  Occupational History  . Not on file  Tobacco Use  . Smoking status: Former Smoker    Packs/day: 0.12    Years: 4.00    Pack years: 0.48    Types: Cigarettes  . Smokeless tobacco: Never Used  . Tobacco comment: 'quit smoking in the 1970s"  Substance and Sexual Activity  . Alcohol use: No  . Drug use: No  . Sexual activity: Not Currently  Other Topics Concern  . Not on file  Social History Narrative  . Not on file     Family History  Problem Relation Age of Onset  . Stroke Mother   . Congestive Heart Failure Mother   . Tuberculosis Father   . Diabetes Brother   . Stroke Brother   . Cancer Brother   . Heart attack Son     Review of Systems: All other systems reviewed and are otherwise negative except as noted above.  Labs:   Lab Results  Component Value Date   WBC 8.9 08/22/2017   HGB 12.6 08/22/2017   HCT 39.2 08/22/2017   MCV 95.6 08/22/2017   PLT 189 08/22/2017    Recent Labs  Lab 08/19/17 1558  08/22/17 1745  NA 140   < > 137  K 2.8*   < > 3.3*  CL 93*   < > 98*  CO2 30   < > 26  BUN 27*   < > 33*  CREATININE 1.28*   < > 1.13*  CALCIUM 9.9   < > 9.4  PROT 7.4  --   --   BILITOT 1.0  --   --   ALKPHOS 82  --   --   ALT 17  --   --   AST  27  --   --   GLUCOSE 125*   < > 108*   < > = values in this interval not displayed.   No results for input(s): CKTOTAL, CKMB, TROPONINI in the last 72 hours. Lab Results  Component Value Date   CHOL 158 07/20/2014   HDL 38.60 (L) 07/20/2014   LDLCALC 73 09/30/2013   TRIG 332.0 (H) 07/20/2014   No results found for:  DDIMER  Radiology/Studies:  Dg Chest 2 View  Result Date: 08/22/2017 CLINICAL DATA:  Pt reports she had a heart catheterization on 08/22/17 and states mid chest pain started today, a few hours ago. Pt reports area is tender to palpation; reports intermittent palpitations. Hx of HTN, CAD, and CHF. Former smoker. EXAM: CHEST - 2 VIEW COMPARISON:  07/23/2017 FINDINGS: Patient has LEFT-sided transvenous pacemaker with leads to the RIGHT atrium, RIGHT ventricle, and coronary sinus. The heart is mildly enlarged. There are prominent interstitial markings consistent with interstitial edema. No overt alveolar edema or consolidations. No pleural effusions. IMPRESSION: Cardiomegaly and mild interstitial edema. Electronically Signed   By: Nolon Nations M.D.   On: 08/22/2017 18:19   Dg Chest 2 View  Result Date: 07/30/2017 CLINICAL DATA:  Cough, weakness, coronary disease post MI, stage III chronic kidney disease, chronic systolic heart failure, hypertension, paroxysmal atrial fibrillation EXAM: CHEST  2 VIEW COMPARISON:  07/28/2017 FINDINGS: LEFT subclavian AICD leads project at RIGHT atrium, RIGHT ventricle and coronary sinus. Enlargement of cardiac silhouette with pulmonary vascular congestion. Mild interstitial infiltrates new since previous exam likely reflecting mild pulmonary edema. No gross pleural effusion or pneumothorax. Numerous EKG leads project over chest. No acute osseous findings. IMPRESSION: Mild CHF. Electronically Signed   By: Lavonia Dana M.D.   On: 07/30/2017 09:26   Dg Chest 2 View  Result Date: 07/28/2017 CLINICAL DATA:  Fatigue, urinary tract infection EXAM: CHEST  2 VIEW COMPARISON:  07/09/2017 FINDINGS: LEFT-sided pacemaker overlies stable enlarged cardiac silhouette. No effusion, infiltrate or pneumothorax. Degenerate spurring of the spine. Low lung volumes. IMPRESSION: Cardiomegaly and low lung volumes.  No acute findings Electronically Signed   By: Suzy Bouchard M.D.   On: 07/28/2017  12:27   Dg Chest Port 1 View  Result Date: 08/02/2017 CLINICAL DATA:  Shortness of Breath EXAM: PORTABLE CHEST 1 VIEW COMPARISON:  August 01, 2017. FINDINGS: Pacemaker leads are attached the right atrium, right ventricle, and coronary sinus. No pneumothorax. There is cardiomegaly with pulmonary vascularity within normal limits. There is no evident edema or consolidation. No evident adenopathy. No bone lesions. IMPRESSION: Stable cardiomegaly. Pacemaker leads appear unchanged in position. No edema or consolidation. Electronically Signed   By: Lowella Grip III M.D.   On: 08/02/2017 07:43   Dg Chest Port 1 View  Result Date: 08/01/2017 CLINICAL DATA:  CHF, coronary artery disease and previous MI, respiratory failure. EXAM: PORTABLE CHEST 1 VIEW COMPARISON:  Chest x-ray of July 30, 2017 FINDINGS: The lungs are adequately inflated. The interstitial markings remain increased. There is no alveolar infiltrate or pleural effusion. The cardiac silhouette remains enlarged and the pulmonary vascularity engorged. There is no change in the position of the ICD. The bony thorax is unremarkable. IMPRESSION: CHF with mild interstitial edema, stable.  No acute pneumonia. Electronically Signed   By: David  Martinique M.D.   On: 08/01/2017 07:53   Wt Readings from Last 3 Encounters:  08/21/17 94 kg (207 lb 3.7 oz)  08/19/17 100 kg (220 lb 8 oz)  08/14/17 94.8 kg (209  lb)    EKG: A sensed, by V paced.  Physical Exam: Blood pressure 117/83, pulse 96, temperature (!) 97.3 F (36.3 C), temperature source Oral, resp. rate (!) 21, SpO2 95 %. There is no height or weight on file to calculate BMI. General: Well developed, well nourished, in no acute distress. Head: Normocephalic, atraumatic, sclera non-icteric, no xanthomas, nares are without discharge.  Neck: Negative for carotid bruits. JVD not elevated. Lungs: Clear bilaterally to auscultation without wheezes, rales, or rhonchi. Breathing is unlabored. Heart:  RRR with S1 S2. No murmurs, rubs, or gallops appreciated. Abdomen: Soft, non-tender, non-distended with normoactive bowel sounds. No hepatomegaly. No rebound/guarding. No obvious abdominal masses. Msk:  Strength and tone appear normal for age. Extremities: No clubbing or cyanosis. No edema.  Distal pedal pulses are 2+ and equal bilaterally. Neuro: Alert and oriented X 3. No focal deficit. No facial asymmetry. Moves all extremities spontaneously. Psych:  Responds to questions appropriately with a normal affect.    Assessment and Plan  nonischemic cardiomyopathy (EF 20% status post bi-V ICD), coronary artery disease status post DES to the mid LAD on 08/20/2017, atrial fibrillation on Eliquis, who presents with an episode of chest pain and palpitations, and was found to have mildly elevated troponin.  1.  Elevated troponin and chest discomfort: Differential includes demand ischemia in the setting of a ventricular arrhythmia versus post PCI troponin leak.  Acute plaque rupture or stent thrombosis is highly unlikely given the flat troponin trend and relatively brief duration of symptoms.  We will plan to cycle 1 more set of cardiac biomarkers and observe on telemetry.  Given that her palpitations preceded the sensation of chest pressure, I am concerned that she did have some sort of ventricular arrhythmia that started this episode.  We will monitor on telemetry and plan to obtain an interrogation of her ICD in the morning.  If it was a ventricular arrhythmia, would consider loading with amiodarone.  2.  Chronic systolic heart failure: Volume status is under excellent control on exam, cardiac index mildly reduced on recent right heart cath.  Plan to continue home diuretic regimen.  Continue home metoprolol and Spironolactone.  She appears to have had a angioedema reaction to a trial of ACE inhibitors in the past, and thus is not on ACE-I at present.  3.  Coronary artery disease: Continue aspirin, Plavix, and  Eliquis triple therapy for now, planning to drop the aspirin at 1 month following PCI.  4.  Hyperlipidemia: Continue high intensity atorvastatin.  5.  Atrial fibrillation: Continue home Eliquis for stroke prevention.  Plan for brief.  Of triple therapy as outlined above.  Signed, Doylene Canning, MD 08/23/2017, 12:48 AM

## 2017-08-23 NOTE — Progress Notes (Signed)
PT Cancellation Note  Patient Details Name: Bianca [name] MRN: [id number] DOB: 30-Jul-1932   Cancelled Treatment:    Reason Eval/Treat Not Completed: Patient declined, no reason specified Attempted to see patient multiple times this morning- on first attempt, RN was working with patient and requested PT return later in morning. On second attempt patient adamantly refused to participate despite extensive education regarding benefits and necessity of participating in skilled PT evaluation/treatments during her hospital stay. Plan to attempt to return as/if schedule allows.    Deniece Ree PT, DPT, CBIS  Supplemental Physical Therapist [facility]   Pager [phone]

## 2017-08-24 DIAGNOSIS — I5043 Acute on chronic combined systolic (congestive) and diastolic (congestive) heart failure: Secondary | ICD-10-CM

## 2017-08-24 LAB — BASIC METABOLIC PANEL
Anion gap: 12 (ref 5–15)
BUN: 29 mg/dL — ABNORMAL HIGH (ref 6–20)
CO2: 28 mmol/L (ref 22–32)
Calcium: 9.4 mg/dL (ref 8.9–10.3)
Chloride: 99 mmol/L — ABNORMAL LOW (ref 101–111)
Creatinine, Ser: 1.01 mg/dL — ABNORMAL HIGH (ref 0.44–1.00)
GFR calc Af Amer: 58 mL/min — ABNORMAL LOW (ref >60)
GFR calc non Af Amer: 50 mL/min — ABNORMAL LOW (ref >60)
Glucose, Bld: 147 mg/dL — ABNORMAL HIGH (ref 65–99)
Potassium: 3.6 mmol/L (ref 3.5–5.1)
Sodium: 139 mmol/L (ref 135–145)

## 2017-08-24 LAB — URINALYSIS, MICROSCOPIC (REFLEX)

## 2017-08-24 LAB — URINALYSIS, ROUTINE W REFLEX MICROSCOPIC
Bilirubin Urine: NEGATIVE
GLUCOSE, UA: NEGATIVE mg/dL
Ketones, ur: NEGATIVE mg/dL
Nitrite: NEGATIVE
PROTEIN: NEGATIVE mg/dL
Specific Gravity, Urine: 1.01 (ref 1.005–1.030)
pH: 5.5 (ref 5.0–8.0)

## 2017-08-24 MED ORDER — FUROSEMIDE 10 MG/ML IJ SOLN: 40.0000 mg | Freq: Two times a day (BID) | INTRAMUSCULAR | Status: DC

## 2017-08-24 MED ORDER — FUROSEMIDE 10 MG/ML IJ SOLN
60.0000 mg | Freq: Two times a day (BID) | INTRAMUSCULAR | Status: DC
  Administered 2017-08-24 – 2017-08-25 (×2): 60 mg via INTRAVENOUS
  Filled 2017-08-24 (×2): qty 6

## 2017-08-24 NOTE — Progress Notes (Signed)
Placed patient on home CPAP for then night with oxygen set at 2lpm.

## 2017-08-24 NOTE — Evaluation (Signed)
Physical Therapy Evaluation Patient Details Name: Bianca [name] MRN: [id number] DOB: [birth year] Today's Date: 08/24/2017   History of Present Illness  Pt is an 84 y/o female withnonischemic cardiomyopathy, hx of atrial fibrillation-currently with chronic anticoagulation, hx of Chronic kidney disease-stage III, chronic systolic heart failure-status post Bi- ventricular ICD-followed by Dr. Lovena Le.Pt with recent admission at the beginning of this month and returns with chest pain. Troponin elevated however felt to be demand ischemia.   Clinical Impression  Pt admitted with above diagnosis. Pt currently with functional limitations due to the deficits listed below (see PT Problem List). At the time of PT eval pt was able to perform transfers and ambulation with up to mod assist for balance support and safety. Pt anticipates d/c home to her ALF apartment as early as tomorrow, and feel she could benefit from [place] services to follow up for a safety assessment and decrease risk of falls. Pt's tolerance for functional activity is low at this time, and pt attributes this to the RW as she is used to a rollator. Daughter in law present and states she will bring in the patient's rollator to practice with tomorrow. Somewhat concerned that the rollator will not provide the stability pt currently needs, however has had falls at home with a standard RW in the past. Per daughter in law, they will arrange 24 hour assistance. Acutely, pt will benefit from skilled PT to increase their independence and safety with mobility to allow discharge to the venue listed below.      Follow Up Recommendations Home health PT;Supervision/Assistance - 24 hour    Equipment Recommendations  None recommended by PT    Recommendations for Other Services       Precautions / Restrictions Precautions Precautions: Fall Precaution Comments: Bilat foot peripheral neuropathy Restrictions Weight Bearing Restrictions: No      Mobility   Bed Mobility               General bed mobility comments: Pt sitting up in recliner upon PT arrival.   Transfers Overall transfer level: Needs assistance Equipment used: Rolling walker (2 wheeled) Transfers: Sit to/from Stand Sit to Stand: Mod assist         General transfer comment: Assist to power-up to full standing position. Pt used momentum to initiate sit>stand. 1 large posterior LOB while standing edge of chair requiring mod assist to recover.   Ambulation/Gait Ambulation/Gait assistance: Min guard;Min assist Ambulation Distance (Feet): 115 Feet Assistive device: Rolling walker (2 wheeled) Gait Pattern/deviations: Step-through pattern;Trunk flexed Gait velocity: Decreased Gait velocity interpretation: Below normal speed for age/gender General Gait Details: Slow and generally steady. Pt reports fatiguing quickly due to RW - used to the easier gliding rollator. Pt required occasional assistance for walker management.   Stairs            Wheelchair Mobility    Modified Rankin (Stroke Patients Only)       Balance Overall balance assessment: Needs assistance Sitting-balance support: No upper extremity supported;Feet supported Sitting balance-Leahy Scale: Good     Standing balance support: Bilateral upper extremity supported;During functional activity Standing balance-Leahy Scale: Poor Standing balance comment: Reliant on UE support                             Pertinent Vitals/Pain Pain Assessment: Faces Faces Pain Scale: Hurts a little bit Pain Location: Back Pain Descriptors / Indicators: Aching Pain Intervention(s): Limited activity within patient's tolerance;Monitored during session;Repositioned  Home Living Family/patient expects to be discharged to:: Private residence Living Arrangements: Alone Available Help at Discharge: Personal care attendant;Family( Estates) Type of Home: Apartment Home Access: Elevator     Home  Layout: One level Home Equipment: Walker - 4 wheels;Shower seat;Grab bars - tub/shower;Grab bars - toilet;Wheelchair - manual;Cane - single point Additional Comments: has a lift chair    Prior Function Level of Independence: Independent with assistive device(s)   Gait / Transfers Assistance Needed: pt ambulates with rollator  ADL's / Homemaking Assistance Needed: Daughter in law was staying with her during shower time for supervision since last admission earlier this month but reports the past 2 showers she was alone and did fine.         Hand Dominance   Dominant Hand: Right    Extremity/Trunk Assessment   Upper Extremity Assessment Upper Extremity Assessment: Defer to OT evaluation    Lower Extremity Assessment Lower Extremity Assessment: Generalized weakness RLE Sensation: history of peripheral neuropathy LLE Sensation: history of peripheral neuropathy    Cervical / Trunk Assessment Cervical / Trunk Assessment: Kyphotic  Communication   Communication: HOH  Cognition Arousal/Alertness: Awake/alert Behavior During Therapy: WFL for tasks assessed/performed Overall Cognitive Status: Within Functional Limits for tasks assessed                                        General Comments      Exercises     Assessment/Plan    PT Assessment Patient needs continued PT services  PT Problem List Decreased activity tolerance;Decreased balance;Decreased mobility;Decreased coordination       PT Treatment Interventions DME instruction;Gait training;Stair training;Functional mobility training;Therapeutic activities;Therapeutic exercise;Balance training;Neuromuscular re-education;Patient/family education    PT Goals (Current goals can be found in the Care Plan section)  Acute Rehab PT Goals Patient Stated Goal: return home PT Goal Formulation: With patient/family Time For Goal Achievement: 09/07/17 Potential to Achieve Goals: Good    Frequency Min 3X/week    Barriers to discharge        Co-evaluation               AM-PAC PT "6 Clicks" Daily Activity  Outcome Measure Difficulty turning over in bed (including adjusting bedclothes, sheets and blankets)?: None Difficulty moving from lying on back to sitting on the side of the bed? : A Little Difficulty sitting down on and standing up from a chair with arms (e.g., wheelchair, bedside commode, etc,.)?: Unable Help needed moving to and from a bed to chair (including a wheelchair)?: A Little Help needed walking in hospital room?: A Little Help needed climbing 3-5 steps with a railing? : A Lot 6 Click Score: 16    End of Session Equipment Utilized During Treatment: Gait belt Activity Tolerance: Patient limited by fatigue Patient left: in chair;with call bell/phone within reach;with family/visitor present Nurse Communication: Mobility status PT Visit Diagnosis: Other abnormalities of gait and mobility (R26.89);Muscle weakness (generalized) (M62.81)    Time: 1530-1600 PT Time Calculation (min) (ACUTE ONLY): 30 min   Charges:   PT Evaluation $PT Eval Moderate Complexity: 1 Mod PT Treatments $Gait Training: 8-22 mins   PT G Codes:        Rolinda Roan, PT, DPT Acute Rehabilitation Services Pager: [phone]   Thelma Comp 08/24/2017, 4:11 PM

## 2017-08-24 NOTE — Progress Notes (Signed)
Patient ID: Bianca [name], female   DOB: October 03, 1932, 84 y.o.   MRN: [id number]     Advanced Heart Failure Rounding Note  PCP-Cardiologist: Candee Furbish, MD   Subjective:    Confusion overnight and this morning, now improved.  Afebrile.  Still dyspnea with walking.  Overall, feels "bad," hard to pin down but has headache and nausea.    Objective:   Weight Range: 213 lb 12.8 oz (97 kg) Body mass index is 34.51 kg/m.   Vital Signs:   Temp:  [97.3 F (36.3 C)-98.9 F (37.2 C)] 97.3 F (36.3 C) (03/09 1245) Pulse Rate:  [80-95] 92 (03/09 1245) Resp:  [18] 18 (03/09 1245) BP: (95-130)/(73-89) 95/81 (03/09 1245) SpO2:  [91 %-98 %] 92 % (03/09 1245) Weight:  [213 lb 12.8 oz (97 kg)] 213 lb 12.8 oz (97 kg) (03/09 0427) Last BM Date: 08/23/17(Per patient.)  Weight change: Filed Weights   08/23/17 0125 08/24/17 0427  Weight: 214 lb 8 oz (97.3 kg) 213 lb 12.8 oz (97 kg)    Intake/Output:   Intake/Output Summary (Last 24 hours) at 08/24/2017 1319 Last data filed at 08/24/2017 1200 Gross per 24 hour  Intake 410 ml  Output 2321 ml  Net -1911 ml      Physical Exam    General:  NAD HEENT: Normal Neck: Supple. JVP 12 cm. Carotids 2+ bilat; no bruits. No lymphadenopathy or thyromegaly appreciated. Cor: PMI nondisplaced. Regular rate & rhythm. No rubs, gallops or murmurs. Lungs: Clear Abdomen: Soft, nontender, nondistended. No hepatosplenomegaly. No bruits or masses. Good bowel sounds. Extremities: No cyanosis, clubbing, rash, edema Neuro: Alert & orientedx3, cranial nerves grossly intact. moves all 4 extremities w/o difficulty. Affect pleasant   Telemetry   NSR, personally reviewed.   Labs    CBC Recent Labs    08/22/17 1745  WBC 8.9  HGB 12.6  HCT 39.2  MCV 95.6  PLT 485   Basic Metabolic Panel Recent Labs    08/23/17 0358 08/24/17 0542  NA 141 139  K 2.7* 3.6  CL 98* 99*  CO2 27 28  GLUCOSE 110* 147*  BUN 29* 29*  CREATININE 1.07* 1.01*  CALCIUM 9.5  9.4  MG 1.8  --    Liver Function Tests No results for input(s): AST, ALT, ALKPHOS, BILITOT, PROT, ALBUMIN in the last 72 hours. No results for input(s): LIPASE, AMYLASE in the last 72 hours. Cardiac Enzymes Recent Labs    08/23/17 0358  TROPONINI 0.13*    BNP: BNP (last 3 results) Recent Labs    01/01/17 1339 07/30/17 0842 08/22/17 2115  BNP 975.5* 1,664.3* 962.0*    ProBNP (last 3 results) No results for input(s): PROBNP in the last 8760 hours.   D-Dimer No results for input(s): DDIMER in the last 72 hours. Hemoglobin A1C No results for input(s): HGBA1C in the last 72 hours. Fasting Lipid Panel No results for input(s): CHOL, HDL, LDLCALC, TRIG, CHOLHDL, LDLDIRECT in the last 72 hours. Thyroid Function Tests No results for input(s): TSH, T4TOTAL, T3FREE, THYROIDAB in the last 72 hours.  Invalid input(s): FREET3  Other results:   Imaging     No results found.   Medications:     Scheduled Medications: . allopurinol  400 mg Oral Daily  . apixaban  5 mg Oral BID  . aspirin  81 mg Oral Daily  . atorvastatin  40 mg Oral Daily  . chlorhexidine  15 mL Mouth Rinse BID  . cholecalciferol  1,000 Units Oral Daily  .  clopidogrel  75 mg Oral Q breakfast  . colestipol  2 g Oral QHS  . furosemide  60 mg Intravenous BID  . gabapentin  400 mg Oral TID  . isosorbide mononitrate  15 mg Oral Daily  . mouth rinse  15 mL Mouth Rinse q12n4p  . metoprolol succinate  25 mg Oral Daily  . nortriptyline  25 mg Oral QHS  . potassium chloride  40 mEq Oral BID  . spironolactone  25 mg Oral Daily     Infusions:   PRN Medications:  acetaminophen, ondansetron (ZOFRAN) IV, senna-docusate    Assessment/Plan   1. CAD: Admitted with chest pain, TnI 0.11=>0.13.  Suspect demand ischemia with mild volume overload.   On 3/5, had cath with DES to severe pLAD stenosis.   - Continue  ASA + Plavix x 1 month along with her Eliquis. Then would stop ASA after 1 month4/10/2017.  -  Continue statin.  2.Chronic systolicEF 19% s/p BiV ICD - [employer]: NICM originally, now with mixed ischemic cardiomyoapthy component as above.Echo 04/2017 LVEF 20%, Grade 2 DD. She was admitted with volume overload.  Today, feels "bad."  On exam, she still has JVD.  Creatinine stable.  - Hold torsemide, will give a couple doses of Lasix 60 mg IV bid, then likely back to torsemide 80 qam/60 qpm.   - Continue toprol xl 25 mg daily. Continue spiro 25 mg daily.  - no aceI or entresto with allergy. Possible losartan in future.  3. Paroxysmal AF: CHA2DS2-VASc 6.NSR here.  No AF on ICDinterpretation 08/23/2017.  - On Eliquis 5 mg twice a day. 4. Morbid obesity with h/o CO2 retention - using oxygen at night: Not on chronic 02 during the day. Has not been show to de-saturate with ambulation.No change. 5. R Flank Hematoma 6. VT: x 4 since Feb 4th, each time with successful ATP. - No VT here.  - Continue Toprol XL.  7. Urinary retention: Voiding trial today.  8. Delirium: Currently clear with family around to re-orient.  Will check UA, culture.   If she diureses well and feels better, may try to get her home tomorrow.   Length of Stay: 1  Loralie Champagne, MD  08/24/2017, 1:19 PM  Advanced Heart Failure Team Pager [phone] (M-F; 7a - 4p)  Please contact [facility] for night-coverage after hours (4p -7a ) and weekends on [email or web address]

## 2017-08-24 NOTE — Progress Notes (Signed)
Pt more confused today per night nurse she got out of bed couple times during night shift. Today she pulled out her heart monitor twice this morning, she is alert and oriented X4. Per daughter, patient seems more confused and she wants her tested for a UTI. Ria Comment NP on call paged who stated to wait for rounding team, RN will continue to monitor patient.

## 2017-08-25 LAB — BASIC METABOLIC PANEL
Anion gap: 14 (ref 5–15)
BUN: 33 mg/dL — ABNORMAL HIGH (ref 6–20)
CO2: 23 mmol/L (ref 22–32)
Calcium: 9.4 mg/dL (ref 8.9–10.3)
Chloride: 97 mmol/L — ABNORMAL LOW (ref 101–111)
Creatinine, Ser: 1.34 mg/dL — ABNORMAL HIGH (ref 0.44–1.00)
GFR calc Af Amer: 42 mL/min — ABNORMAL LOW (ref >60)
GFR calc non Af Amer: 36 mL/min — ABNORMAL LOW (ref >60)
Glucose, Bld: 162 mg/dL — ABNORMAL HIGH (ref 65–99)
Potassium: 5.1 mmol/L (ref 3.5–5.1)
Sodium: 134 mmol/L — ABNORMAL LOW (ref 135–145)

## 2017-08-25 LAB — URINE CULTURE: Culture: NO GROWTH

## 2017-08-25 MED ORDER — TORSEMIDE 20 MG PO TABS
60.0000 mg | ORAL_TABLET | Freq: Every evening | ORAL | Status: DC
  Administered 2017-08-25: 60 mg via ORAL
  Filled 2017-08-25: qty 3

## 2017-08-25 MED ORDER — TORSEMIDE 20 MG PO TABS
80.0000 mg | ORAL_TABLET | Freq: Every day | ORAL | Status: DC
  Administered 2017-08-26: 80 mg via ORAL
  Filled 2017-08-25: qty 4

## 2017-08-25 MED ORDER — SODIUM CHLORIDE 0.9 % IV SOLN
1.0000 g | INTRAVENOUS | Status: DC
  Administered 2017-08-25 – 2017-08-26 (×2): 1 g via INTRAVENOUS
  Filled 2017-08-25 (×3): qty 10

## 2017-08-25 NOTE — Progress Notes (Signed)
Patient ID: Bianca [name], female   DOB: 02-18-1933, 84 y.o.   MRN: [id number]     Advanced Heart Failure Rounding Note  PCP-Cardiologist: Candee Furbish, MD   Subjective:    Sleepy today.  Still occasional nausea.  Denies dyspnea.  Very weak with PT.  I/Os not recorded and weights not accurate (done in bed).    Objective:   Weight Range: 217 lb 9.6 oz (98.7 kg) Body mass index is 35.12 kg/m.   Vital Signs:   Temp:  [97.3 F (36.3 C)-98.1 F (36.7 C)] 98.1 F (36.7 C) (03/10 0417) Pulse Rate:  [92-95] 94 (03/10 1003) Resp:  [18-19] 19 (03/10 0417) BP: (95-119)/(62-81) 106/62 (03/10 1003) SpO2:  [90 %-92 %] 90 % (03/10 0417) Weight:  [217 lb 9.6 oz (98.7 kg)] 217 lb 9.6 oz (98.7 kg) (03/10 0559) Last BM Date: 08/24/17  Weight change: Filed Weights   08/23/17 0125 08/24/17 0427 08/25/17 0559  Weight: 214 lb 8 oz (97.3 kg) 213 lb 12.8 oz (97 kg) 217 lb 9.6 oz (98.7 kg)    Intake/Output:   Intake/Output Summary (Last 24 hours) at 08/25/2017 1234 Last data filed at 08/25/2017 0930 Gross per 24 hour  Intake 595 ml  Output 675 ml  Net -80 ml      Physical Exam    General: NAD, drowsy Neck: No JVD, no thyromegaly or thyroid nodule.  Lungs: Mildly decreased breath sounds at bases.  CV: Nondisplaced PMI.  Heart regular S1/S2, no S3/S4, no murmur.  Trace ankle edema.   Abdomen: Soft, nontender, no hepatosplenomegaly, no distention.  Skin: Intact without lesions or rashes.  Neurologic: Alert and oriented x 3.  Psych: Normal affect. Extremities: No clubbing or cyanosis.  HEENT: Normal.    Telemetry   NSR, personally reviewed.   Labs    CBC Recent Labs    08/22/17 1745  WBC 8.9  HGB 12.6  HCT 39.2  MCV 95.6  PLT 433   Basic Metabolic Panel Recent Labs    08/23/17 0358 08/24/17 0542 08/25/17 0153  NA 141 139 134*  K 2.7* 3.6 5.1  CL 98* 99* 97*  CO2 27 28 23   GLUCOSE 110* 147* 162*  BUN 29* 29* 33*  CREATININE 1.07* 1.01* 1.34*  CALCIUM 9.5 9.4  9.4  MG 1.8  --   --    Liver Function Tests No results for input(s): AST, ALT, ALKPHOS, BILITOT, PROT, ALBUMIN in the last 72 hours. No results for input(s): LIPASE, AMYLASE in the last 72 hours. Cardiac Enzymes Recent Labs    08/23/17 0358  TROPONINI 0.13*    BNP: BNP (last 3 results) Recent Labs    01/01/17 1339 07/30/17 0842 08/22/17 2115  BNP 975.5* 1,664.3* 962.0*    ProBNP (last 3 results) No results for input(s): PROBNP in the last 8760 hours.   D-Dimer No results for input(s): DDIMER in the last 72 hours. Hemoglobin A1C No results for input(s): HGBA1C in the last 72 hours. Fasting Lipid Panel No results for input(s): CHOL, HDL, LDLCALC, TRIG, CHOLHDL, LDLDIRECT in the last 72 hours. Thyroid Function Tests No results for input(s): TSH, T4TOTAL, T3FREE, THYROIDAB in the last 72 hours.  Invalid input(s): FREET3  Other results:   Imaging    No results found.   Medications:     Scheduled Medications: . allopurinol  400 mg Oral Daily  . apixaban  5 mg Oral BID  . aspirin  81 mg Oral Daily  . atorvastatin  40 mg Oral  Daily  . chlorhexidine  15 mL Mouth Rinse BID  . cholecalciferol  1,000 Units Oral Daily  . clopidogrel  75 mg Oral Q breakfast  . colestipol  2 g Oral QHS  . gabapentin  400 mg Oral TID  . isosorbide mononitrate  15 mg Oral Daily  . mouth rinse  15 mL Mouth Rinse q12n4p  . metoprolol succinate  25 mg Oral Daily  . nortriptyline  25 mg Oral QHS  . spironolactone  25 mg Oral Daily  . torsemide  60 mg Oral QPM  . [START ON 08/26/2017] torsemide  80 mg Oral Q breakfast    Infusions:   PRN Medications: acetaminophen, ondansetron (ZOFRAN) IV, senna-docusate    Assessment/Plan   1. CAD: Admitted with chest pain, TnI 0.11=>0.13.  Suspect demand ischemia with mild volume overload.   On 3/5, had cath with DES to severe pLAD stenosis.   - Continue  ASA + Plavix x 1 month along with her Eliquis. Then would stop ASA after 1  month4/10/2017.  - Continue statin.  2.Chronic systolicEF 59% s/p BiV ICD - [employer]: NICM originally, now with mixed ischemic cardiomyoapthy component as above.Echo 04/2017 LVEF 20%, Grade 2 DD. She was admitted with volume overload.  She was diuresed with IV Lasix yesterday.  I/Os not recorded and only have bed weights, but on exam she does not appear volume overloaded.  Very weak walking with PT.   - Transition to torsemide 80 qam/60 qpm.   - Continue toprol xl 25 mg daily. Continue spiro 25 mg daily.  - no aceI or entresto with allergy. Possible losartan in future.  3. Paroxysmal AF: CHA2DS2-VASc 6.NSR here.  No AF on ICDinterpretation 08/23/2017.  - On Eliquis 5 mg twice a day. 4. Morbid obesity with h/o CO2 retention- using oxygen at night: Not on chronic 02 during the day. Has not been show to de-saturate with ambulation.No change. 5. R Flank Hematoma 6. VT: x 4 since Feb 4th, each time with successful ATP. - No VT here.  - Continue Toprol XL.  7. Urinary retention: Foley now out.  8. Delirium: Drowsy this morning but oriented.  UA yesterday was borderline for UTI but culture with no growth.  Will treat as UTI with short course of antibiotics.   I do not think she is going to be able to go back to independent living at this time.  Will ask social work to see her today and screen her for SNF.   Length of Stay: 2  Loralie Champagne, MD  08/25/2017, 12:34 PM  Advanced Heart Failure Team Pager [phone] (M-F; 7a - 4p)  Please contact [facility] for night-coverage after hours (4p -7a ) and weekends on [email or web address]

## 2017-08-25 NOTE — Progress Notes (Signed)
Family is concerned that patient is nauseated and having some abdominal pain and also not eating her meals. They understand that everything is okay with the heart for now but is more concerned about this. They have had a hospitalist every every admission and was wondering if they can see talk to one at this moment and also find out what is causing her to have the nausea. Ria Comment, [place] was notified.

## 2017-08-25 NOTE — Progress Notes (Signed)
Placed patient on HOME CPAP via nasal mask with 2 LPm O2 bleed in.

## 2017-08-25 NOTE — Progress Notes (Signed)
Physical Therapy Treatment Patient Details Name: Bianca [name] MRN: [id number] DOB: 07/08/1932 Today's Date: 08/25/2017    History of Present Illness Pt is an 84 y/o female withnonischemic cardiomyopathy, hx of atrial fibrillation-currently with chronic anticoagulation, hx of Chronic kidney disease-stage III, chronic systolic heart failure-status post Bi- ventricular ICD-followed by Dr. Lovena Le.Pt with recent admission at the beginning of this month and returns with chest pain. Troponin elevated however felt to be demand ischemia.     PT Comments    Continuing work on functional mobility and activity tolerance;  Ms. Hemmelgarn was very sleepy today, and oriented to herself only; Per nursing, she had been up and down OOB most of the night; Used Rollator RW today to better approximate home; Noted difficulty sequencing with standing from Rollator and turning to walk with it; Significant balance deficits -- she is heavily reliant on UE support in standing -- unable to stand without holding to something; 3-4 instances requiring Mod assist to regain balance with posterior losses of balance;   For her to be able to dc home safely, Ms. Madia will need 24 hour close supervision/mod assist, it is my impression she has that at home, and I confirmed this with Ander Purpura, RN; To prevent falls, it is worth considering having someone sleep in the same room as pt -- though my hope would be that she will be more oriented and back to baseline at her home with familiar caregivers, environment, and routine; they do keep a baby monitor in Ms. Taha' room overnight;   Have placed OT order per protocol for assessment of ADLs, and would like HHPT as well as HHPT at dc.   Follow Up Recommendations  Home health PT;Supervision/Assistance - 24 hour;Other (comment)(HHOT as well; worth considering short-term rehab, but we must wiegh that against benefits of being at home with familiar caregivers, environment, and routine;  per RN it is highly unlikely family would agree to SNF)     Equipment Recommendations  None recommended by PT    Recommendations for Other Services OT consult(Ordered per protocol)     Precautions / Restrictions Precautions Precautions: Fall Precaution Comments: Bilat foot peripheral neuropathy Required Braces or Orthoses: Other Brace/Splint Other Brace/Splint: Consider using her shoes to walk    Mobility  Bed Mobility Overal bed mobility: Needs Assistance Bed Mobility: Rolling;Sidelying to Sit Rolling: Supervision(used rails) Sidelying to sit: Mod assist       General bed mobility comments: Mod handheld assist to pull from sidelying to sit  Transfers Overall transfer level: Needs assistance Equipment used: 4-wheeled walker Transfers: Sit to/from Stand Sit to Stand: Mod assist         General transfer comment: Assist to power-up to full standing position. Pt used momentum to initiate sit>stand. 1 large posterior LOB while standing edge of chair requiring mod assist to recover. Poor control of descent to sit  Ambulation/Gait Ambulation/Gait assistance: Min assist;Mod assist Ambulation Distance (Feet): 70 Feet Assistive device: 4-wheeled walker Gait Pattern/deviations: Step-through pattern;Trunk flexed Gait velocity: Decreased   General Gait Details: Slow and unsteady today, occasionally requiring mod assist to keep balance; tending to posterior lean   Stairs            Wheelchair Mobility    Modified Rankin (Stroke Patients Only)       Balance Overall balance assessment: Needs assistance Sitting-balance support: No upper extremity supported;Feet supported Sitting balance-Leahy Scale: Good     Standing balance support: Bilateral upper extremity supported;During functional activity Standing balance-Leahy Scale: Poor  Standing balance comment: Reliant on UE support; Difficulty sequencing turning to Rollator once standing from Rollator; needs constant  UE support in standing                            Cognition Arousal/Alertness: Awake/alert(slightly sleepy) Behavior During Therapy: WFL for tasks assessed/performed Overall Cognitive Status: Impaired/Different from baseline Area of Impairment: Orientation                 Orientation Level: Disoriented to;Place;Time;Situation       Safety/Judgement: Decreased awareness of safety;Decreased awareness of deficits   Problem Solving: Difficulty sequencing;Requires verbal cues;Requires tactile cues General Comments: Needed cues/cluse to be able to identify that we are in a hospital; answered "[place] [employer]" when asked where we are; RN reports she often has delirium when in the hospital      Exercises      General Comments General comments (skin integrity, edema, etc.): Noted HR 102 with activity      Pertinent Vitals/Pain Pain Assessment: Faces Faces Pain Scale: Hurts little more Pain Location: Bilateral knees and feet; she attributes to neuropathy Pain Descriptors / Indicators: Aching Pain Intervention(s): Monitored during session    Home Living                      Prior Function            PT Goals (current goals can now be found in the care plan section) Acute Rehab PT Goals Patient Stated Goal: return home PT Goal Formulation: Patient unable to participate in goal setting Time For Goal Achievement: 09/07/17 Potential to Achieve Goals: Good Progress towards PT goals: Not progressing toward goals - comment(very sleepy this am; was up all night)    Frequency    Min 3X/week      PT Plan Discharge plan needs to be updated    Co-evaluation              AM-PAC PT "6 Clicks" Daily Activity  Outcome Measure  Difficulty turning over in bed (including adjusting bedclothes, sheets and blankets)?: None Difficulty moving from lying on back to sitting on the side of the bed? : A Lot Difficulty sitting down on and standing up  from a chair with arms (e.g., wheelchair, bedside commode, etc,.)?: Unable Help needed moving to and from a bed to chair (including a wheelchair)?: A Lot Help needed walking in hospital room?: A Little Help needed climbing 3-5 steps with a railing? : A Lot 6 Click Score: 14    End of Session Equipment Utilized During Treatment: Gait belt Activity Tolerance: Patient limited by fatigue Patient left: in bed;with call bell/phone within reach;with bed alarm set;Other (comment)(sitting EOB to eat breakfast) Nurse Communication: Mobility status PT Visit Diagnosis: Other abnormalities of gait and mobility (R26.89);Muscle weakness (generalized) (M62.81)     Time: 0626-9485 PT Time Calculation (min) (ACUTE ONLY): 24 min  Charges:  $Gait Training: 8-22 mins $Therapeutic Activity: 8-22 mins                    G Codes:       Roney Marion, PT  Acute Rehabilitation Services Pager [phone] Office [place] 08/25/2017, 8:34 AM

## 2017-08-26 ENCOUNTER — Other Ambulatory Visit: Payer: Self-pay

## 2017-08-26 LAB — BASIC METABOLIC PANEL
Anion gap: 13 (ref 5–15)
BUN: 37 mg/dL — ABNORMAL HIGH (ref 6–20)
CO2: 22 mmol/L (ref 22–32)
Calcium: 9.2 mg/dL (ref 8.9–10.3)
Chloride: 95 mmol/L — ABNORMAL LOW (ref 101–111)
Creatinine, Ser: 1.59 mg/dL — ABNORMAL HIGH (ref 0.44–1.00)
GFR calc Af Amer: 34 mL/min — ABNORMAL LOW (ref >60)
GFR calc non Af Amer: 29 mL/min — ABNORMAL LOW (ref >60)
Glucose, Bld: 133 mg/dL — ABNORMAL HIGH (ref 65–99)
Potassium: 5.6 mmol/L — ABNORMAL HIGH (ref 3.5–5.1)
Sodium: 130 mmol/L — ABNORMAL LOW (ref 135–145)

## 2017-08-26 LAB — CBC WITH DIFFERENTIAL/PLATELET
BASOS PCT: 0 %
Basophils Absolute: 0 10*3/uL (ref 0.0–0.1)
Eosinophils Absolute: 0.1 10*3/uL (ref 0.0–0.7)
Eosinophils Relative: 1 %
HEMATOCRIT: 42.2 % (ref 36.0–46.0)
HEMOGLOBIN: 13.3 g/dL (ref 12.0–15.0)
LYMPHS ABS: 3.4 10*3/uL (ref 0.7–4.0)
LYMPHS PCT: 27 %
MCH: 30.2 pg (ref 26.0–34.0)
MCHC: 31.5 g/dL (ref 30.0–36.0)
MCV: 95.9 fL (ref 78.0–100.0)
Monocytes Absolute: 0.9 10*3/uL (ref 0.1–1.0)
Monocytes Relative: 7 %
NEUTROS ABS: 8.4 10*3/uL — AB (ref 1.7–7.7)
NEUTROS PCT: 65 %
Platelets: 238 10*3/uL (ref 150–400)
RBC: 4.4 MIL/uL (ref 3.87–5.11)
RDW: 14.8 % (ref 11.5–15.5)
WBC: 12.9 10*3/uL — ABNORMAL HIGH (ref 4.0–10.5)

## 2017-08-26 LAB — AMMONIA: Ammonia: 50 umol/L — ABNORMAL HIGH (ref 9–35)

## 2017-08-26 MED ORDER — DOCUSATE SODIUM 100 MG PO CAPS: 100.0000 mg | ORAL_CAPSULE | Freq: Every day | ORAL | Status: DC | PRN

## 2017-08-26 MED ORDER — LACTULOSE 10 GM/15ML PO SOLN
20.0000 g | Freq: Three times a day (TID) | ORAL | Status: DC
  Administered 2017-08-26 – 2017-08-28 (×6): 20 g via ORAL
  Filled 2017-08-26 (×6): qty 30

## 2017-08-26 MED ORDER — GABAPENTIN 400 MG PO CAPS
400.0000 mg | ORAL_CAPSULE | Freq: Two times a day (BID) | ORAL | Status: DC
  Administered 2017-08-27 – 2017-08-28 (×3): 400 mg via ORAL
  Filled 2017-08-26 (×3): qty 1

## 2017-08-26 MED ORDER — POLYETHYLENE GLYCOL 3350 17 G PO PACK
17.0000 g | PACK | Freq: Every day | ORAL | Status: DC
  Administered 2017-08-26 – 2017-08-28 (×3): 17 g via ORAL
  Filled 2017-08-26 (×4): qty 1

## 2017-08-26 NOTE — Clinical Social Work Placement (Signed)
   CLINICAL SOCIAL WORK PLACEMENT  NOTE  Date:  08/26/2017  Patient Details  Name: Bianca [name] MRN: [id number] Date of Birth: 1933/05/17  Clinical Social Work is seeking post-discharge placement for this patient at the [place] level of care (*CSW will initial, date and re-position this form in  chart as items are completed):  Yes   Patient/family provided with [facility] Work Department's list of facilities offering this level of care within the geographic area requested by the patient (or if unable, by the patient's family).  Yes   Patient/family informed of their freedom to choose among providers that offer the needed level of care, that participate in Medicare, Medicaid or managed care program needed by the patient, have an available bed and are willing to accept the patient.  Yes   Patient/family informed of [facility]'s ownership interest in [facility], [place] and [facility], as well as of the fact that they are under no obligation to receive care at these facilities.  PASRR submitted to EDS on 08/26/17     PASRR number received on       Existing PASRR number confirmed on 08/26/17     FL2 transmitted to all facilities in geographic area requested by pt/family on 08/26/17     FL2 transmitted to all facilities within larger geographic area on       Patient informed that his/her managed care company has contracts with or will negotiate with certain facilities, including the following:            Patient/family informed of bed offers received.  Patient chooses bed at       Physician recommends and patient chooses bed at      Patient to be transferred to   on  .  Patient to be transferred to facility by       Patient family notified on   of transfer.  Name of family member notified:        PHYSICIAN Please sign FL2     Additional Comment:    _______________________________________________ Candie Chroman, LCSW 08/26/2017,  10:21 AM

## 2017-08-26 NOTE — Progress Notes (Addendum)
Physical Therapy Treatment Patient Details Name: Bianca [name] MRN: [id number] DOB: [birth year] Today's Date: 08/26/2017    History of Present Illness Pt is an 84 y/o female withnonischemic cardiomyopathy, hx of atrial fibrillation-currently with chronic anticoagulation, hx of Chronic kidney disease-stage III, chronic systolic heart failure-status post Bi- ventricular ICD-followed by Dr. Lovena Le.Pt with recent admission at the beginning of this month and returns with chest pain. Troponin elevated however felt to be demand ischemia.     PT Comments    Pt sleeping on arrival and family reports she has been getting her days and nights mixed up.  Pt appears agitated when asked to participate and family reports she is not like this at home.  Family reports she has had hospital delirium in the past.  Pt's plan is to return home and family reports she will have 24 hour care even if they have to hire help.  Plan next session to continued gait and functional mobility to improve safety as d/c approaches.    Follow Up Recommendations  Home health PT;Supervision/Assistance - 24 hour;Other (comment)(HHOT, HH aide and 24 hour assistance ( family reports they will hire))     Equipment Recommendations  None recommended by PT    Recommendations for Other Services       Precautions / Restrictions Precautions Precautions: Fall Precaution Comments: Bilat foot peripheral neuropathy Required Braces or Orthoses: Other Brace/Splint Other Brace/Splint: performs better with her shoes on to walk Restrictions Weight Bearing Restrictions: No    Mobility  Bed Mobility Overal bed mobility: Needs Assistance Bed Mobility: Rolling;Sidelying to Sit;Sit to Supine Rolling: Min guard(used rails) Sidelying to sit: Mod assist   Sit to supine: Min assist(to lift LEs to position into supine)   General bed mobility comments: Pt required assistance to elevate trunk into sitting, required assistance with B LEs to  return to bed.    Transfers Overall transfer level: Needs assistance Equipment used: 4-wheeled walker Transfers: Sit to/from Stand Sit to Stand: Min assist         General transfer comment: Pt slow to ascend with weight on her heels causing a posterior bias.  Pt required cues for forward weight shifting and accepting weight through her toes.  Pt performed from bed, rollator seated and commode ( used grab bar on R with commode).    Ambulation/Gait Ambulation/Gait assistance: Min assist;+2 safety/equipment(to push IV pole.  ) Ambulation Distance (Feet): 40 Feet(x2 trials. ) Assistive device: 4-wheeled walker Gait Pattern/deviations: Step-through pattern;Trunk flexed Gait velocity: Decreased   General Gait Details: Pt with poor safety in regards to positioning.  Cues to step closer to rollator to improve posture.  Pt required seated rest break on her rollator.  Cues for B step length and foot clearance.     Stairs            Wheelchair Mobility    Modified Rankin (Stroke Patients Only)       Balance Overall balance assessment: Needs assistance Sitting-balance support: No upper extremity supported;Feet supported Sitting balance-Leahy Scale: Fair     Standing balance support: Bilateral upper extremity supported;During functional activity Standing balance-Leahy Scale: Poor Standing balance comment: Reliant on UE support; Difficulty sequencing turning to Rollator once standing from Rollator; needs constant UE support in standing; posterior lean                            Cognition Arousal/Alertness: Lethargic;Suspect due to medications(Pt's family reports she is experiencing hospital delirium.  )  Behavior During Therapy: Flat affect Overall Cognitive Status: Impaired/Different from baseline Area of Impairment: Orientation;Attention;Memory;Following commands;Safety/judgement;Awareness;Problem solving                 Orientation Level: Disoriented  to;Place;Time;Situation Current Attention Level: Sustained Memory: Decreased short-term memory Following Commands: Follows one step commands with increased time Safety/Judgement: Decreased awareness of safety;Decreased awareness of deficits Awareness: Intellectual Problem Solving: Difficulty sequencing;Requires verbal cues;Requires tactile cues;Slow processing;Decreased initiation General Comments: Cues to keep eyes open throughout session. Mod physical assist to safely use rollator; difficulty maintaining attention to task      Exercises      General Comments        Pertinent Vitals/Pain Pain Assessment: 0-10 Pain Score: 4  Pain Location: Bilateral knees and feet; she attributes to neuropathy Pain Descriptors / Indicators: Aching Pain Intervention(s): Monitored during session    Home Living                      Prior Function            PT Goals (current goals can now be found in the care plan section) Acute Rehab PT Goals Patient Stated Goal: return home Potential to Achieve Goals: Good Progress towards PT goals: Progressing toward goals    Frequency    Min 3X/week      PT Plan Discharge plan needs to be updated    Co-evaluation              AM-PAC PT "6 Clicks" Daily Activity  Outcome Measure  Difficulty turning over in bed (including adjusting bedclothes, sheets and blankets)?: None Difficulty moving from lying on back to sitting on the side of the bed? : A Lot Difficulty sitting down on and standing up from a chair with arms (e.g., wheelchair, bedside commode, etc,.)?: Unable Help needed moving to and from a bed to chair (including a wheelchair)?: A Little Help needed walking in hospital room?: A Little Help needed climbing 3-5 steps with a railing? : A Lot 6 Click Score: 15    End of Session Equipment Utilized During Treatment: Gait belt Activity Tolerance: Patient limited by fatigue Patient left: in bed;with call bell/phone within  reach;with bed alarm set;Other (comment)(placed bed in chair position as supper was arriving) Nurse Communication: Mobility status PT Visit Diagnosis: Other abnormalities of gait and mobility (R26.89);Muscle weakness (generalized) (M62.81)     Time: 0102-7253 PT Time Calculation (min) (ACUTE ONLY): 41 min  Charges:  $Gait Training: 8-22 mins $Therapeutic Activity: 23-37 mins                    G CodesGovernor Rooks, PTA pager [phone]    Cristela Blue 08/26/2017, 6:05 PM

## 2017-08-26 NOTE — Progress Notes (Addendum)
Patient ID: Bianca [name], female   DOB: [birth year], 84 y.o.   MRN: [id number]     Advanced Heart Failure Rounding Note  PCP-Cardiologist: Candee Furbish, MD   Subjective:    Creatinine up 1.34 > 1.59. K is 5.6 this am. Weight is up 2 lbs (standing weights).   Less confusion overnight. Able to ambulate to the toilet with no CP, SOB, or dizziness. C/o constipation.   Objective:   Weight Range: 219 lb 14.4 oz (99.7 kg) Body mass index is 35.49 kg/m.   Vital Signs:   Temp:  [97.9 F (36.6 C)-99.1 F (37.3 C)] 98.4 F (36.9 C) (03/11 0440) Pulse Rate:  [88-94] 91 (03/11 0440) Resp:  [16-18] 18 (03/11 0440) BP: (94-112)/(62-75) 103/62 (03/11 0440) SpO2:  [91 %-97 %] 97 % (03/11 0440) Weight:  [219 lb 14.4 oz (99.7 kg)] 219 lb 14.4 oz (99.7 kg) (03/11 0440) Last BM Date: 08/24/17  Weight change: Filed Weights   08/24/17 0427 08/25/17 0559 08/26/17 0440  Weight: 213 lb 12.8 oz (97 kg) 217 lb 9.6 oz (98.7 kg) 219 lb 14.4 oz (99.7 kg)    Intake/Output:   Intake/Output Summary (Last 24 hours) at 08/26/2017 0800 Last data filed at 08/26/2017 0600 Gross per 24 hour  Intake 935 ml  Output 800 ml  Net 135 ml      Physical Exam    General: Elderly. No resp difficulty. Sitting up in chair.  HEENT: Normal Neck: Supple. JVP 8. Carotids 2+ bilat; no bruits. No thyromegaly or nodule noted. Cor: PMI nondisplaced. RRR, No M/G/R noted Lungs: Diminished in bases. No wheezing.  Abdomen: obese, soft, non-tender, non-distended, no HSM. No bruits or masses. +BS  Extremities: No cyanosis, clubbing, or rash. R and LLE trace edema Neuro: Alert & orientedx3, cranial nerves grossly intact. moves all 4 extremities w/o difficulty. Affect pleasant   Telemetry   Vpaced 80-90's with IVCD, underlying SR. Personally reviewed.   Labs    CBC Recent Labs    08/26/17 0521  WBC 12.9*  NEUTROABS 8.4*  HGB 13.3  HCT 42.2  MCV 95.9  PLT 834   Basic Metabolic Panel Recent Labs     08/25/17 0153 08/26/17 0521  NA 134* 130*  K 5.1 5.6*  CL 97* 95*  CO2 23 22  GLUCOSE 162* 133*  BUN 33* 37*  CREATININE 1.34* 1.59*  CALCIUM 9.4 9.2   Liver Function Tests No results for input(s): AST, ALT, ALKPHOS, BILITOT, PROT, ALBUMIN in the last 72 hours. No results for input(s): LIPASE, AMYLASE in the last 72 hours. Cardiac Enzymes No results for input(s): CKTOTAL, CKMB, CKMBINDEX, TROPONINI in the last 72 hours.  BNP: BNP (last 3 results) Recent Labs    01/01/17 1339 07/30/17 0842 08/22/17 2115  BNP 975.5* 1,664.3* 962.0*    ProBNP (last 3 results) No results for input(s): PROBNP in the last 8760 hours.   D-Dimer No results for input(s): DDIMER in the last 72 hours. Hemoglobin A1C No results for input(s): HGBA1C in the last 72 hours. Fasting Lipid Panel No results for input(s): CHOL, HDL, LDLCALC, TRIG, CHOLHDL, LDLDIRECT in the last 72 hours. Thyroid Function Tests No results for input(s): TSH, T4TOTAL, T3FREE, THYROIDAB in the last 72 hours.  Invalid input(s): FREET3  Other results:   Imaging    No results found.   Medications:     Scheduled Medications: . allopurinol  400 mg Oral Daily  . apixaban  5 mg Oral BID  . aspirin  81 mg  Oral Daily  . atorvastatin  40 mg Oral Daily  . chlorhexidine  15 mL Mouth Rinse BID  . cholecalciferol  1,000 Units Oral Daily  . clopidogrel  75 mg Oral Q breakfast  . colestipol  2 g Oral QHS  . gabapentin  400 mg Oral TID  . isosorbide mononitrate  15 mg Oral Daily  . mouth rinse  15 mL Mouth Rinse q12n4p  . metoprolol succinate  25 mg Oral Daily  . nortriptyline  25 mg Oral QHS  . spironolactone  25 mg Oral Daily  . torsemide  60 mg Oral QPM  . torsemide  80 mg Oral Q breakfast    Infusions: . cefTRIAXone (ROCEPHIN)  IV Stopped (08/25/17 1655)    PRN Medications: acetaminophen, ondansetron (ZOFRAN) IV, senna-docusate    Assessment/Plan   1. CAD: Admitted with chest pain, TnI 0.11=>0.13.   Suspect demand ischemia with mild volume overload.   On 3/5, had cath with DES to severe pLAD stenosis.   - Continue  ASA + Plavix x 1 month along with her Eliquis. Then would stop ASA after 1 month4/10/2017.  - Continue statin.  - No CP.  2.Chronic systolicEF 70% s/p BiV ICD - [employer]: NICM originally, now with mixed ischemic cardiomyoapthy component as above.Echo 04/2017 LVEF 20%, Grade 2 DD. She was admitted with volume overload.  She was diuresed with torsemide yesterday. Volume status stable on exam today, but with rise in creatinine will hold diuretics. - Hold torsemide 80 qam/60 qpm with creatinine up to 1.6 - Continue toprol xl 25 mg daily.  - Hold spiro 25 mg daily with K up to 5.6.  - no aceI or entresto with allergy. Possible losartan in future. SBP 90-100's.  3. Paroxysmal AF: CHA2DS2-VASc 6.NSR here.  No AF on ICDinterpretation 08/23/2017.  - On Eliquis 5 mg twice a day.No s/s bleeding 4. Morbid obesity with h/o CO2 retention- using oxygen at night: Not on chronic 02 during the day. Has not been show to de-saturate with ambulation.No change.  5. R Flank Hematoma 6. VT: x 4 since Feb 4th, each time with successful ATP. - No VT here.  - Continue Toprol XL.  7. Urinary retention: Foley now out. Being treated for UTI. 8. Delirium: not confused this am.  UA was borderline for UTI but culture with no growth.  Will treat as UTI with short course of antibiotics.   9. Deconditioning - PT recommending HHPT with 24 hour supervision vs short term rehab. CSW consult placed. She lives in independent living currently.  10. AKI: baseline is 1.0-1.1. Creatinine up to 1.59 this morning and K is 5.6  - Hold spiro and torsemide today 11. Constipation: no BM in 6 days - Miralax and colace ordered   Length of Stay: [place], NP  08/26/2017, 8:00 AM  Advanced Heart Failure Team Pager [phone] (M-F; [place])  Please contact [facility] for night-coverage after hours (4p  -7a ) and weekends on [email or web address]  Patient seen with NP, agree with the above note.  Delirium remains an issue.  Very drowsy today. Being treated for possible UTI with ceftriaxone.  Per daughter, has had problems with delirium in hospital in the past.   - Will send ammonia level and LFTs.   On exam, she is not markedly volume overloaded.  JVP about 8 cm.  Creatinine and K are up today.  Holding spironolactone and torsemide today, will likely need to start back on lower torsemide dose  tomorrow.   Treat constipation.   Will need SNF.    Loralie Champagne 08/26/2017 1:54 PM

## 2017-08-26 NOTE — Progress Notes (Signed)
Occupational Therapy Evaluation Patient Details Name: Bianca [name] MRN: [id number] DOB: 1933/04/30 Today's Date: 08/26/2017    History of Present Illness Pt is an 84 y/o female withnonischemic cardiomyopathy, hx of atrial fibrillation-currently with chronic anticoagulation, hx of Chronic kidney disease-stage III, chronic systolic heart failure-status post Bi- ventricular ICD-followed by Dr. Lovena Le.Pt with recent admission at the beginning of this month and returns with chest pain. Troponin elevated however felt to be demand ischemia.    Clinical Impression   PTA, pt lived at [facility] and was modified independent with mobility and ADL @ rollator level. Pt was active within her retirement community. Pt very lethargic this am. HR  Pt able to ambulate to [facility] using rollator with mod A and continued verbal and tactile cues to complete functional task and maintain level of arousal. Pt had large BM and required A for pericare. Family present to observe amount of assistance required with mobility.Pt would benefit from rehab at a SNF, however, family prefers to DC home with 24/7 care. Pt is high fall risk and will require physical assist upon DC. Family verbalized understanding. HR 89; O2 97. nsg aware of lethargy. NT states that pt has not slept well for multiple nights which may be contributing to her lethargy and confusion.     Follow Up Recommendations  Home health OT;Supervision/Assistance - 24 hour    Equipment Recommendations  None recommended by OT    Recommendations for Other Services       Precautions / Restrictions Precautions Precautions: Fall Precaution Comments: Bilat foot peripheral neuropathy Required Braces or Orthoses:  Other Brace/Splint: Consider using her shoes to walk Restrictions Weight Bearing Restrictions: No      Mobility Bed Mobility     Rolling: (used rails)         General bed mobility comments: OOB in chair  Transfers Overall transfer  level: Needs assistance Equipment used: 4-wheeled walker Transfers: Sit to/from Stand Sit to Stand: Mod assist         General transfer comment: significant posterior lean    Balance Overall balance assessment: Needs assistance Sitting-balance support: No upper extremity supported;Feet supported Sitting balance-Leahy Scale: Fair     Standing balance support: Bilateral upper extremity supported;During functional activity Standing balance-Leahy Scale: Poor Standing balance comment: Reliant on UE support; Difficulty sequencing turning to Rollator once standing from Rollator; needs constant UE support in standing; posterior lean                           ADL either performed or assessed with clinical judgement   ADL Overall ADL's : Needs assistance/impaired Eating/Feeding: Sitting;Set up;Supervision/ safety   Grooming: Wash/dry hands;Wash/dry face;Supervision/safety;Standing   Upper Body Bathing: Sitting;Minimal assistance   Lower Body Bathing: Moderate assistance   Upper Body Dressing : Sitting;Moderate assistance   Lower Body Dressing: Sit to/from stand;Moderate assistance   Toilet Transfer: Ambulation;Moderate assistance;BSC(rollator)   Toileting- Clothing Manipulation and Hygiene: Moderate assistance;Sitting/lateral lean       Functional mobility during ADLs: Moderate assistance;Cueing for safety;Cueing for sequencing(rollator)       Vision Baseline Vision/History: Wears glasses Wears Glasses: Reading only       Perception     Praxis      Pertinent Vitals/Pain Faces Pain Scale: Hurts little more Pain Location: Bilateral knees and feet; she attributes to neuropathy Pain Descriptors / Indicators: Aching     Hand Dominance Right   Extremity/Trunk Assessment Upper Extremity Assessment Upper Extremity Assessment: Generalized  weakness   Lower Extremity Assessment Lower Extremity Assessment: Defer to PT evaluation RLE Sensation: history of  peripheral neuropathy LLE Sensation: history of peripheral neuropathy   Cervical / Trunk Assessment Cervical / Trunk Assessment: Kyphotic   Communication Communication Communication: HOH   Cognition Arousal/Alertness: Lethargic(slightly sleepy) Behavior During Therapy: Flat affect Overall Cognitive Status: Impaired/Different from baseline Area of Impairment: Orientation;Attention;Memory;Following commands;Safety/judgement;Awareness;Problem solving                 Orientation Level: Disoriented to;Place;Time;Situation Current Attention Level: Sustained Memory: Decreased short-term memory Following Commands: Follows one step commands with increased time Safety/Judgement: Decreased awareness of safety;Decreased awareness of deficits Awareness: Intellectual Problem Solving: Difficulty sequencing;Requires verbal cues;Requires tactile cues;Slow processing;Decreased initiation General Comments: Cues to keep eyes open throughout session. Mod physical assist to safely use rollator; difficulty maintaining attention to task   General Comments       Exercises     Shoulder Instructions      Home Living Family/patient expects to be discharged to:: Private residence Living Arrangements: Alone Available Help at Discharge: Personal care attendant;Family([place]) Type of Home: Apartment Home Access: Elevator     Home Layout: One level     Bathroom Shower/Tub: Occupational psychologist: Handicapped height Bathroom Accessibility: Yes How Accessible: Accessible via wheelchair;Accessible via walker Home Equipment: [place] - 4 wheels;Shower seat;Grab bars - tub/shower;Grab bars - toilet;Wheelchair - manual;Cane - single point   Additional Comments: has a lift chair      Prior Functioning/Environment Level of Independence: Independent with assistive device(s)  Gait / Transfers Assistance Needed: pt ambulates with rollator ADL's / Homemaking Assistance Needed:  Daughter in law was staying with her during shower time for supervision since last admission earlier this month but reports the past 2 showers she was alone and did fine.    Comments: was able to perform her own care of bathing and dressing        OT Problem List: Decreased strength;Decreased activity tolerance;Decreased knowledge of use of DME or AE;Decreased safety awareness;Impaired balance (sitting and/or standing);Pain;Decreased knowledge of precautions;Decreased cognition;Cardiopulmonary status limiting activity;Obesity      OT Treatment/Interventions: Self-care/ADL training;DME and/or AE instruction;Therapeutic activities;Therapeutic exercise;Patient/family education;Balance training;Energy conservation;Cognitive remediation/compensation    OT Goals(Current goals can be found in the care plan section) Acute Rehab OT Goals Patient Stated Goal: return home OT Goal Formulation: With patient/family Time For Goal Achievement: 09/09/17 Potential to Achieve Goals: Good  OT Frequency: Min 3X/week   Barriers to D/C:            Co-evaluation              AM-PAC PT "6 Clicks" Daily Activity     Outcome Measure Help from another person eating meals?: A Little Help from another person taking care of personal grooming?: A Little Help from another person toileting, which includes using toliet, bedpan, or urinal?: A Lot Help from another person bathing (including washing, rinsing, drying)?: A Lot Help from another person to put on and taking off regular upper body clothing?: A Lot Help from another person to put on and taking off regular lower body clothing?: A Lot 6 Click Score: 14   End of Session Equipment Utilized During Treatment: Gait belt;Other (comment)(3 in 1; rollator) Nurse Communication: Mobility status  Activity Tolerance: Patient tolerated treatment well Patient left: with call bell/phone within reach;with family/visitor present;in chair;with chair alarm set  OT  Visit Diagnosis: Unsteadiness on feet (R26.81);Other abnormalities of gait and mobility (R26.89);Muscle weakness (generalized) (  M62.81);Repeated falls (R29.6);History of falling (Z91.81);Other symptoms and signs involving cognitive function                Time: 0131-4388 OT Time Calculation (min): 43 min Charges:  OT General Charges $OT Visit: 1 Visit OT Evaluation $OT Eval Moderate Complexity: 1 Mod OT Treatments $Self Care/Home Management : 23-37 mins G-Codes:     [facility], OT/L  [phone] 08/26/2017  Stran Raper,HILLARY 08/26/2017, 9:38 AM

## 2017-08-26 NOTE — Clinical Social Work Note (Signed)
Clinical Social Work Assessment  Patient Details  Name: Bianca [name] MRN: [id number] Date of Birth: Aug 12, 1932  Date of referral:  08/26/17               Reason for consult:  Facility Placement, Discharge Planning                Permission sought to share information with:  Facility Sport and exercise psychologist, Family Supports Permission granted to share information::  Yes, Verbal Permission Granted  Name::     [name] and Armed forces technical officer::  SNF's  Relationship::  Son and daughter-in-law  Contact Information:  [name]: [phone], [name]: [phone]  Housing/Transportation Living arrangements for the past 2 months:  [place] of Information:  Patient, Medical Team, Adult Children Patient Interpreter Needed:  None Criminal Activity/Legal Involvement Pertinent to Current Situation/Hospitalization:  No - Comment as needed Significant Relationships:  Adult Children Lives with:  Self, Facility Resident Do you feel safe going back to the place where you live?  Yes Need for family participation in patient care:  Yes (Comment)  Care giving concerns:  Patient is a resident at [facility]. PT recommending HHPT but family and hospital staff believe patient needs more nursing care than she is getting at her ILF.   Social Worker assessment / plan:  CSW met with patient. She is very lethargic today but smiles at [place]. Patient's son and daughter-in-law at bedside. CSW and patient's daughter-in-law are familiar from past admissions. They are interested in looking at short-term SNF placement with a plan to return to [facility]. Patient's family and staff believe patient needs more nursing care than she is getting at her ILF. Preferences are [facility], [facility], and [facility]. Will follow progress and determine venue closer to discharge. No further concerns. CSW encouraged patient and her family to contact CSW as needed. CSW will  continue to follow patient and her family for support and facilitate discharge to SNF, if needed, once medically stable.  Employment status:  Retired Forensic scientist:  Other (Comment Required)([employer]) PT Recommendations:  Home with [place], [place] / Referral to community resources:  [place]  Patient/Family's Response to care:  Patient and her family agreeable to SNF placement. Patient's family supportive and involved in patient's care. Patient and her family appreciated social work intervention.  Patient/Family's Understanding of and Emotional Response to Diagnosis, Current Treatment, and Prognosis:  Patient and her family have a good understanding of the reason for admission and her potential need for rehab prior to returning to [place]. Patient and her family appear happy with hospital care.  Emotional Assessment Appearance:  Appears stated age Attitude/Demeanor/Rapport:  Lethargic Affect (typically observed):  Pleasant Orientation:  Oriented to Self, Oriented to Place, Oriented to  Time, Oriented to Situation Alcohol / Substance use:    Psych involvement (Current and /or in the community):  No (Comment)  Discharge Needs  Concerns to be addressed:  Care Coordination Readmission within the last 30 days:  Yes Current discharge risk:  Dependent with Mobility, Lives alone Barriers to Discharge:  Continued Medical Work up, [place], LCSW 08/26/2017, 10:17 AM

## 2017-08-26 NOTE — Plan of Care (Signed)
  Activity: Capacity to carry out activities will improve 08/26/2017 1312 - Not Progressing by Abner Greenspan, RN   Education: Ability to verbalize understanding of medication therapies will improve 08/26/2017 1312 - Not Progressing by Abner Greenspan, RN   Education: Ability to demonstrate management of disease process will improve 08/26/2017 1312 - Not Progressing by Abner Greenspan, RN

## 2017-08-26 NOTE — Progress Notes (Signed)
PT Cancellation Note  Patient Details Name: Bianca [name] MRN: [id number] DOB: April 29, 1933   Cancelled Treatment:    Reason Eval/Treat Not Completed: Patient declined, no reason specified. Pt declining participation in therapy session due to visitors in room. PT to re-attempt as time allows. Per LCSW note, family now agreeable to [facility].   Lorriane Shire 08/26/2017, 1:11 PM   Lorrin Goodell, PT  Office # [phone] Pager [phone]

## 2017-08-26 NOTE — Patient Outreach (Signed)
[facility]) Care Management  08/26/2017  ARLEENE SETTLE 10-03-1932 [id number]   Assessment: RNCM returned call to client's daughter in law, Dezra Mandella, who called to update RNCM about client's hospitalization and to ask about recommendations for personal care services. She reports looking into 24 hour around the clock care for client when she returns to her home. Ms. Gresham states she has asked for recommendations at the [facility] and has an agency in mind.   She also informed RNCM that client is having "delirium" in the hospital adding that "hospital delirium" is not new for client and adds that it always clears up when she gets home.   Mrs. Stellar Gensel reports she has received a referral list from the inpatient case manager and is talking with residents at the [facility] living facility for agency recommendations. RNCM provided positive feedback that getting receommendations from people who have actually used the personal care service is a great start to choosing a personal care service agency.   RNC encouraged daughter in law to continue to communicate with the inpatient case manager regarding client's discharge needs.  RNCM encouraged her to continue to call RNCM with questions and/or concerns as needed.  Plan: continue to follow.  Thea Silversmith, RN, MSN, [place] Coordinator Cell: [phone]

## 2017-08-26 NOTE — NC FL2 (Signed)
[place] LEVEL OF CARE SCREENING TOOL     IDENTIFICATION  Patient Name: Bianca [name] Birthdate: [birth year] Sex: female Admission Date (Current Location): 08/22/2017  [facility] and [place] Number:  Herbalist and Address:  The . [facility], [place] [address] [place], [place] [address]      Provider Number: [id number]  Attending Physician Name and Address:  Dorothy Spark, MD  Relative Name and Phone Number:       Current Level of Care: Hospital Recommended Level of Care: [place] Prior Approval Number:    Date Approved/Denied:   PASRR Number: [id number]  Discharge Plan: SNF    Current Diagnoses: Patient Active Problem List   Diagnosis Date Noted  . Ischemic cardiomyopathy   . Acute respiratory failure with hypoxia ([place]) 07/30/2017  . Recurrent UTI 07/30/2017  . Altered mental status 07/30/2017  . Obstructive sleep apnea on CPAP 07/30/2017  . Yeast dermatitis 07/30/2017  . Hypokalemia 07/30/2017  . Atrial fibrillation, transient ([place]) 07/30/2017  . Arthritis of right knee/neuropathy 07/30/2017  . Class 1 obesity with body mass index (BMI) of 32.0 to 32.9 in adult 07/30/2017  . Dyslipidemia 07/30/2017  . CKD (chronic kidney disease), symptom management only, stage 3 (moderate) ([place]) 07/30/2017  . Hypertension 01/01/2017  . Sleep apnea 01/01/2017  . CAD (coronary artery disease)/LBBB 01/01/2017  . History of ventricular tachycardia 01/01/2017  . PAF (paroxysmal atrial fibrillation) ([place]) 01/01/2017  . CKD (chronic kidney disease) stage 3, GFR 30-59 ml/min (HCC) 01/01/2017  . Peripheral neuropathy 01/01/2017  . OSA on CPAP 01/01/2017  . Obesity (BMI 30-39.9) 01/01/2017  . ICD (implantable cardioverter-defibrillator) in place 01/01/2017  . Dyspnea   . Ventricular tachycardia ([place])   . Subclinical hypothyroidism 12/25/2015  . CKD (chronic kidney disease), stage III ([place]) 12/22/2015  . Hypoxia 12/22/2015   . Hypoxemia 12/21/2015  . CHF exacerbation ([place]) 11/24/2015  . Physical deconditioning 11/24/2015  . Hyperglycemia 11/24/2015  . Peripheral neuropathy 11/24/2015  . HLD (hyperlipidemia) 11/24/2015  . Depression 11/24/2015  . Chest pain 11/24/2015  . Neuropathy involving both lower extremities   . Paroxysmal atrial fibrillation (HCC)   . OSA (obstructive sleep apnea)   . E. coli UTI   . Subcutaneous hematoma   . Fall 10/25/2015  . Afib ([place]) 01/19/2015  . Long-term (current) use of anticoagulants 07/20/2014  . Persistent atrial fibrillation ([place]) 07/20/2014  . Encounter for therapeutic drug monitoring 07/16/2013  . Chronic systolic heart failure ([place]) 07/16/2013  . History of implantable cardioverter-defibrillator (ICD) placement 07/16/2013  . Morbid obesity ([place]) 07/16/2013  . Chronic anticoagulation 07/16/2013  . Knee osteoarthritis 07/16/2013  . HYPOKALEMIA 08/09/2010  . Neuropathy ([place]) 08/09/2010  . Chronic diastolic (congestive) heart failure ([place]) 08/09/2010  . Automatic implantable cardioverter-defibrillator in situ 08/09/2010    Orientation RESPIRATION BLADDER Height & Weight     Self, Time, Place, Situation  Normal, Other (Comment)(HOME CPAP at night via nasal mask with 2 LPm O2 bleed in) Continent Weight: 219 lb 14.4 oz (99.7 kg) Height:  5\' 6"  (167.6 cm)  BEHAVIORAL SYMPTOMS/MOOD NEUROLOGICAL BOWEL NUTRITION STATUS  (None) (None) Continent Diet(Heart healthy. Fluid restriction: 1800 mL.)  AMBULATORY STATUS COMMUNICATION OF NEEDS Skin   Limited Assist Verbally Bruising, Other (Comment)(Intertriginous Dermatitis, MASD.)                       Personal Care Assistance Level of Assistance  Bathing, Feeding, Dressing Bathing Assistance: Limited assistance Feeding assistance:  Limited assistance Dressing Assistance: Limited assistance     Functional Limitations Info  Sight, Hearing, Speech Sight Info: Adequate Hearing Info: Adequate Speech Info: Adequate     SPECIAL CARE FACTORS FREQUENCY  PT (By licensed PT), OT (By licensed OT)     PT Frequency: 5 x week OT Frequency: 5 x week            Contractures Contractures Info: Not present    Additional Factors Info  Code Status, Allergies Code Status Info: Full Allergies Info: Quinapril Hcl, Tape, Coreg (Carvedilol), Tessalon (Benzonatate), Neosporin (Neomycin-polymyxin-gramicidin).           Current Medications (08/26/2017):  This is the current hospital active medication list Current Facility-Administered Medications  Medication Dose Route Frequency Provider Last Rate Last Dose  . acetaminophen (TYLENOL) tablet 650 mg  650 mg Oral Q4H PRN Chakravartti, Jaidip, MD   650 mg at 08/24/17 1254  . allopurinol (ZYLOPRIM) tablet 400 mg  400 mg Oral Daily Chakravartti, Jaidip, MD   400 mg at 08/25/17 1004  . apixaban (ELIQUIS) tablet 5 mg  5 mg Oral BID Chakravartti, Jaidip, MD   5 mg at 08/25/17 2231  . aspirin chewable tablet 81 mg  81 mg Oral Daily Chakravartti, Jaidip, MD   81 mg at 08/25/17 1005  . atorvastatin (LIPITOR) tablet 40 mg  40 mg Oral Daily Chakravartti, Jaidip, MD   40 mg at 08/25/17 1004  . cefTRIAXone (ROCEPHIN) 1 g in sodium chloride 0.9 % 100 mL IVPB  1 g Intravenous Q24H Dorothy Spark, MD   Stopped at 08/25/17 1655  . chlorhexidine (PERIDEX) 0.12 % solution 15 mL  15 mL Mouth Rinse BID Dorothy Spark, MD   15 mL at 08/25/17 2231  . cholecalciferol (VITAMIN D) tablet 1,000 Units  1,000 Units Oral Daily Chakravartti, Jaidip, MD   1,000 Units at 08/25/17 1003  . clopidogrel (PLAVIX) tablet 75 mg  75 mg Oral Q breakfast Chakravartti, Jaidip, MD   75 mg at 08/26/17 0759  . colestipol (COLESTID) tablet 2 g  2 g Oral QHS Chakravartti, Jaidip, MD   2 g at 08/25/17 2231  . docusate sodium (COLACE) capsule 100 mg  100 mg Oral Daily PRN Georgiana Shore, NP      . gabapentin (NEURONTIN) capsule 400 mg  400 mg Oral TID Doylene Canning, MD   400 mg at 08/25/17 2232  .  isosorbide mononitrate (IMDUR) 24 hr tablet 15 mg  15 mg Oral Daily Chakravartti, Jaidip, MD   15 mg at 08/25/17 1003  . MEDLINE mouth rinse  15 mL Mouth Rinse q12n4p Dorothy Spark, MD   15 mL at 08/25/17 1618  . metoprolol succinate (TOPROL-XL) 24 hr tablet 25 mg  25 mg Oral Daily Chakravartti, Jaidip, MD   25 mg at 08/25/17 1003  . nortriptyline (PAMELOR) capsule 25 mg  25 mg Oral QHS Chakravartti, Jaidip, MD   25 mg at 08/25/17 2232  . ondansetron (ZOFRAN) injection 4 mg  4 mg Intravenous Q6H PRN Chakravartti, Jaidip, MD   4 mg at 08/25/17 1613  . polyethylene glycol (MIRALAX / GLYCOLAX) packet 17 g  17 g Oral Daily Georgiana Shore, NP      . senna-docusate (Senokot-S) tablet 1 tablet  1 tablet Oral BID PRN Doylene Canning, MD   1 tablet at 08/26/17 0759     Discharge Medications: Please see discharge summary for a list of discharge medications.  Relevant Imaging Results:  Relevant Lab Results:  Additional Information SS#: [id number]. Lives at [facility]. Staff and family believe she needs more supervision than what she can get at [facility].   Candie Chroman, LCSW
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
## 2017-08-26 NOTE — Clinical Social Work Note (Signed)
[facility] both extended bed offers pending availability on day of discharge. [facility] declined. Patient's son and daughter-in-law notified.  Dayton Scrape, [place]

## 2017-08-27 ENCOUNTER — Inpatient Hospital Stay (HOSPITAL_COMMUNITY): Payer: PPO

## 2017-08-27 DIAGNOSIS — R410 Disorientation, unspecified: Secondary | ICD-10-CM

## 2017-08-27 LAB — CBC WITH DIFFERENTIAL/PLATELET
Basophils Absolute: 0 10*3/uL (ref 0.0–0.1)
Basophils Relative: 0 %
EOS PCT: 1 %
Eosinophils Absolute: 0.2 10*3/uL (ref 0.0–0.7)
HEMATOCRIT: 39.8 % (ref 36.0–46.0)
Hemoglobin: 12.6 g/dL (ref 12.0–15.0)
Lymphocytes Relative: 21 %
Lymphs Abs: 2.7 10*3/uL (ref 0.7–4.0)
MCH: 30.3 pg (ref 26.0–34.0)
MCHC: 31.7 g/dL (ref 30.0–36.0)
MCV: 95.7 fL (ref 78.0–100.0)
MONO ABS: 1 10*3/uL (ref 0.1–1.0)
MONOS PCT: 8 %
NEUTROS ABS: 9 10*3/uL — AB (ref 1.7–7.7)
Neutrophils Relative %: 70 %
PLATELETS: 217 10*3/uL (ref 150–400)
RBC: 4.16 MIL/uL (ref 3.87–5.11)
RDW: 15.1 % (ref 11.5–15.5)
WBC: 12.9 10*3/uL — ABNORMAL HIGH (ref 4.0–10.5)

## 2017-08-27 LAB — BASIC METABOLIC PANEL WITH GFR
Anion gap: 11 (ref 5–15)
BUN: 42 mg/dL — ABNORMAL HIGH (ref 6–20)
CO2: 25 mmol/L (ref 22–32)
Calcium: 8.9 mg/dL (ref 8.9–10.3)
Chloride: 94 mmol/L — ABNORMAL LOW (ref 101–111)
Creatinine, Ser: 1.51 mg/dL — ABNORMAL HIGH (ref 0.44–1.00)
GFR calc Af Amer: 36 mL/min — ABNORMAL LOW
GFR calc non Af Amer: 31 mL/min — ABNORMAL LOW
Glucose, Bld: 128 mg/dL — ABNORMAL HIGH (ref 65–99)
Potassium: 5 mmol/L (ref 3.5–5.1)
Sodium: 130 mmol/L — ABNORMAL LOW (ref 135–145)

## 2017-08-27 LAB — HEPATIC FUNCTION PANEL
ALT: 25 U/L (ref 14–54)
AST: 54 U/L — ABNORMAL HIGH (ref 15–41)
Albumin: 3.7 g/dL (ref 3.5–5.0)
Alkaline Phosphatase: 68 U/L (ref 38–126)
Bilirubin, Direct: 0.5 mg/dL (ref 0.1–0.5)
Indirect Bilirubin: 1.3 mg/dL — ABNORMAL HIGH (ref 0.3–0.9)
Total Bilirubin: 1.8 mg/dL — ABNORMAL HIGH (ref 0.3–1.2)
Total Protein: 6.6 g/dL (ref 6.5–8.1)

## 2017-08-27 MED ORDER — ALBUTEROL SULFATE (2.5 MG/3ML) 0.083% IN NEBU
2.5000 mg | INHALATION_SOLUTION | Freq: Four times a day (QID) | RESPIRATORY_TRACT | Status: DC | PRN
  Administered 2017-08-30: 2.5 mg via RESPIRATORY_TRACT
  Filled 2017-08-27: qty 3

## 2017-08-27 MED ORDER — LEVOFLOXACIN IN D5W 750 MG/150ML IV SOLN
750.0000 mg | INTRAVENOUS | Status: DC
Start: 2017-08-27 — End: 2017-08-28
  Administered 2017-08-27: 750 mg via INTRAVENOUS
  Filled 2017-08-27: qty 150

## 2017-08-27 NOTE — Care Management Important Message (Signed)
Important Message  Patient Details  Name: Bianca [name] MRN: [id number] Date of Birth: 12/18/1932   Medicare Important Message Given:  Yes    [name] P [place] 08/27/2017, 1:34 PM

## 2017-08-27 NOTE — Progress Notes (Addendum)
Physical Therapy Treatment Patient Details Name: Bianca [name] MRN: [id number] DOB: 1933-03-15 Today's Date: 08/27/2017    History of Present Illness Pt is an 84 y/o female withnonischemic cardiomyopathy, hx of atrial fibrillation-currently with chronic anticoagulation, hx of Chronic kidney disease-stage III, chronic systolic heart failure-status post Bi- ventricular ICD-followed by Dr. Lovena Le.Pt with recent admission at the beginning of this month and returns with chest pain. Troponin elevated however felt to be demand ischemia.     PT Comments    Pt reports she is having multiple bouts of stool incontinence today.  Pt required a brief to ambulate in halls and stool remains incontinent.  Pt require longer standing trials for pericare.  Plan for 24 hour assistance remains absolutely necessary as patient is not safe to return home without.  Plan next session to continue gait training and functional mobility.    Follow Up Recommendations  Home health PT;Supervision/Assistance - 24 hour;Other (comment)(Family reports they will hire 24 hour assistance. if she does not have 24 hour assistance she will require SNF.)     Equipment Recommendations  None recommended by PT    Recommendations for Other Services       Precautions / Restrictions Precautions Precautions: Fall Precaution Comments: Bilat foot peripheral neuropathy Required Braces or Orthoses: Other Brace/Splint Other Brace/Splint: performs better with her shoes on to walk Restrictions Weight Bearing Restrictions: No RUE Weight Bearing: Non weight bearing    Mobility  Bed Mobility Overal bed mobility: Needs Assistance Bed Mobility: Rolling;Sidelying to Sit;Sit to Sidelying Rolling: Min guard(used rails) Sidelying to sit: Mod assist     Sit to sidelying: Min assist General bed mobility comments: Pt required assistance to elevate trunk into sitting, required assistance with B LEs to return to bed.    Transfers Overall  transfer level: Needs assistance Equipment used: 4-wheeled walker Transfers: Sit to/from Stand Sit to Stand: Min assist         General transfer comment: Pt remains to require cues for hand placement to and from seated surface.  Pt performed multiple transfers as she was having bowel complications.    Ambulation/Gait Ambulation/Gait assistance: Min assist;+2 safety/equipment Ambulation Distance (Feet): 30 Feet Assistive device: 4-wheeled walker Gait Pattern/deviations: Step-through pattern;Trunk flexed Gait velocity: Decreased Gait velocity interpretation: Below normal speed for age/gender General Gait Details: Required max cues for encouragement to participate in gait training, cues to step closer to rollator and increase step length and foot clearance.     Stairs            Wheelchair Mobility    Modified Rankin (Stroke Patients Only)       Balance Overall balance assessment: Needs assistance   Sitting balance-Leahy Scale: Fair       Standing balance-Leahy Scale: Poor Standing balance comment: Reliant on UE support;                             Cognition Arousal/Alertness: Awake/alert Behavior During Therapy: Agitated Overall Cognitive Status: Impaired/Different from baseline                   Orientation Level: Disoriented to;Time;Situation Current Attention Level: Sustained Memory: Decreased short-term memory Following Commands: Follows one step commands with increased time Safety/Judgement: Decreased awareness of safety;Decreased awareness of deficits Awareness: Intellectual Problem Solving: Difficulty sequencing;Requires verbal cues;Requires tactile cues;Slow processing;Decreased initiation        Exercises      General Comments  Pertinent Vitals/Pain Pain Assessment: Faces Faces Pain Scale: Hurts little more Pain Location: Bilateral knees and feet; she attributes to neuropathy Pain Descriptors / Indicators:  Aching;Burning Pain Intervention(s): Monitored during session;Repositioned    Home Living                      Prior Function            PT Goals (current goals can now be found in the care plan section) Acute Rehab PT Goals Patient Stated Goal: return home Potential to Achieve Goals: Good Progress towards PT goals: Progressing toward goals    Frequency    Min 3X/week      PT Plan Current plan remains appropriate    Co-evaluation              AM-PAC PT "6 Clicks" Daily Activity  Outcome Measure  Difficulty turning over in bed (including adjusting bedclothes, sheets and blankets)?: None Difficulty moving from lying on back to sitting on the side of the bed? : A Lot Difficulty sitting down on and standing up from a chair with arms (e.g., wheelchair, bedside commode, etc,.)?: Unable Help needed moving to and from a bed to chair (including a wheelchair)?: A Little Help needed walking in hospital room?: A Little Help needed climbing 3-5 steps with a railing? : A Lot 6 Click Score: 15    End of Session Equipment Utilized During Treatment: Gait belt Activity Tolerance: Patient limited by fatigue Patient left: in bed;with call bell/phone within reach;with bed alarm set;Other (comment)(placed bed in chair position.  ) Nurse Communication: Mobility status PT Visit Diagnosis: Other abnormalities of gait and mobility (R26.89);Muscle weakness (generalized) (M62.81)     Time: 6734-1937 PT Time Calculation (min) (ACUTE ONLY): 49 min  Charges:  $Gait Training: 8-22 mins $Therapeutic Activity: 23-37 mins                    G CodesGovernor Rooks, PTA pager [phone]    Cristela Blue 08/27/2017, 6:02 PM

## 2017-08-27 NOTE — Progress Notes (Addendum)
Patient ID: Bianca [name], female   DOB: [birth year], 84 y.o.   MRN: [id number]     Advanced Heart Failure Rounding Note  PCP-Cardiologist: Candee Furbish, MD   Subjective:    Diuretics held yesterday. Weight unchanged (standing). Creatinine 1.5, K 5.0 this am.   Confused yesterday but improved today. Ammonia was 50 yesterday. Received lactulose. Much clearer today.   No CP, SOB, or dizziness. Up in chair this morning and more interactive. Several BMs overnight.   Objective:   Weight Range: 219 lb 6.4 oz (99.5 kg) Body mass index is 35.41 kg/m.   Vital Signs:   Temp:  [97.7 F (36.5 C)-98.2 F (36.8 C)] 98 F (36.7 C) (03/12 0546) Pulse Rate:  [82-95] 95 (03/12 0546) Resp:  [18] 18 (03/12 0546) BP: (102-113)/(48-72) 109/72 (03/12 0546) SpO2:  [93 %-97 %] 97 % (03/12 0546) Weight:  [219 lb 6.4 oz (99.5 kg)] 219 lb 6.4 oz (99.5 kg) (03/12 0546) Last BM Date: 08/26/17  Weight change: Filed Weights   08/25/17 0559 08/26/17 0440 08/27/17 0546  Weight: 217 lb 9.6 oz (98.7 kg) 219 lb 14.4 oz (99.7 kg) 219 lb 6.4 oz (99.5 kg)    Intake/Output:   Intake/Output Summary (Last 24 hours) at 08/27/2017 0825 Last data filed at 08/26/2017 1929 Gross per 24 hour  Intake 240 ml  Output 800 ml  Net -560 ml      Physical Exam   General: Elderly. Sitting in chair. No resp difficulty. HEENT: Normal Neck: Supple. JVP 8-9. Carotids 2+ bilat; no bruits. No thyromegaly or nodule noted. Cor: PMI nondisplaced. RRR, No M/G/R noted Lungs: fine crackles in bases Abdomen: Obese Soft, non-tender, non-distended, no HSM. No bruits or masses. +BS  Extremities: No cyanosis, clubbing, or rash. R and LLE no edema. No asterixis Neuro:  Alert and oriented, cranial nerves grossly intact. moves all 4 extremities w/o difficulty. Affect pleasant   Telemetry   V-paced 90's with IVCD. Personally reviewed.   Labs    CBC Recent Labs    08/26/17 0521 08/27/17 0535  WBC 12.9* 12.9*  NEUTROABS  8.4* 9.0*  HGB 13.3 12.6  HCT 42.2 39.8  MCV 95.9 95.7  PLT 238 884   Basic Metabolic Panel Recent Labs    08/26/17 0521 08/27/17 0535  NA 130* 130*  K 5.6* 5.0  CL 95* 94*  CO2 22 25  GLUCOSE 133* 128*  BUN 37* 42*  CREATININE 1.59* 1.51*  CALCIUM 9.2 8.9   Liver Function Tests Recent Labs    08/27/17 0535  AST 54*  ALT 25  ALKPHOS 68  BILITOT 1.8*  PROT 6.6  ALBUMIN 3.7   No results for input(s): LIPASE, AMYLASE in the last 72 hours. Cardiac Enzymes No results for input(s): CKTOTAL, CKMB, CKMBINDEX, TROPONINI in the last 72 hours.  BNP: BNP (last 3 results) Recent Labs    01/01/17 1339 07/30/17 0842 08/22/17 2115  BNP 975.5* 1,664.3* 962.0*    ProBNP (last 3 results) No results for input(s): PROBNP in the last 8760 hours.   D-Dimer No results for input(s): DDIMER in the last 72 hours. Hemoglobin A1C No results for input(s): HGBA1C in the last 72 hours. Fasting Lipid Panel No results for input(s): CHOL, HDL, LDLCALC, TRIG, CHOLHDL, LDLDIRECT in the last 72 hours. Thyroid Function Tests No results for input(s): TSH, T4TOTAL, T3FREE, THYROIDAB in the last 72 hours.  Invalid input(s): FREET3  Other results:   Imaging    No results found.   Medications:  Scheduled Medications: . allopurinol  400 mg Oral Daily  . apixaban  5 mg Oral BID  . aspirin  81 mg Oral Daily  . atorvastatin  40 mg Oral Daily  . chlorhexidine  15 mL Mouth Rinse BID  . cholecalciferol  1,000 Units Oral Daily  . clopidogrel  75 mg Oral Q breakfast  . colestipol  2 g Oral QHS  . gabapentin  400 mg Oral BID  . isosorbide mononitrate  15 mg Oral Daily  . lactulose  20 g Oral TID  . mouth rinse  15 mL Mouth Rinse q12n4p  . metoprolol succinate  25 mg Oral Daily  . nortriptyline  25 mg Oral QHS  . polyethylene glycol  17 g Oral Daily    Infusions: . cefTRIAXone (ROCEPHIN)  IV Stopped (08/26/17 1703)    PRN Medications: acetaminophen, docusate sodium,  ondansetron (ZOFRAN) IV, senna-docusate    Assessment/Plan   1. CAD: Admitted with chest pain, TnI 0.11=>0.13.  Suspect demand ischemia with mild volume overload.   On 3/5, had cath with DES to severe pLAD stenosis.   - Continue  ASA + Plavix x 1 month along with her Eliquis. Then would stop ASA after 1 month4/10/2017.  - Continue statin.  - No CP  2.Chronic systolicEF 85% s/p BiV ICD - [employer]: NICM originally, now with mixed ischemic cardiomyoapthy component as above.Echo 04/2017 LVEF 20%, Grade 2 DD. She was admitted with volume overload. Diuretics held yesterday for rise in creatinine.  - Volume status stable on exam. Creatinine 1.5. Hold torsemide for one more day. Takes torsemide 80/40 at home.  - Continue toprol xl 25 mg daily.  - Hold spiro 25 mg daily with hyperkalemia yesterday - no aceI or entresto with allergy. Possible losartan in future.  3. Paroxysmal AF: CHA2DS2-VASc 6.NSR here.  No AF on ICDinterpretation 08/23/2017.  - On Eliquis 5 mg twice a day.No s/s bleeding 4. Morbid obesity with h/o CO2 retention- using oxygen at night: Not on chronic 02 during the day. Has not been show to de-saturate with ambulation.No change.  5. R Flank Hematoma 6. VT: x 4 since Feb 4th, each time with successful ATP. - No VT here.  - Continue Toprol XL.  7. Urinary retention: Foley now out. Being treated for UTI. 8. Delirium: Improved. UA was borderline for UTI but culture with no growth.  Will treat as UTI with short course of antibiotics.  - Ammonia 50 raises suspicion of possible hepatic encephalopathy > received lactulose. Improved this am.  - AST 54, ALT 25 9. Deconditioning - PT recommending HHPT with 24 hour supervision vs short term rehab. CSW following with bed offers for SNF at DC.  10. AKI: baseline is 1.0-1.1. Creatinine 1.59 > 1.5 - Volume status stable on exam. Hold torsemide one more day 11. Constipation:  - Several BM's overnight. Resolved  Length of Stay:  Basco, NP  08/27/2017, 8:25 AM  Advanced Heart Failure Team Pager [phone] (M-F; 7a - 4p)  Please contact [facility] for night-coverage after hours (4p -7a ) and weekends on [email or web address]  Patient seen and examined with the above-signed Advanced Practice Provider and/or Housestaff. I personally reviewed laboratory data, imaging studies and relevant notes. I independently examined the patient and formulated the important aspects of the plan. I have edited the note to reflect any of my changes or salient points. I have personally discussed the plan with the patient and/or family.  Had productive cough this am with greenish  sputum. No fever. CXR viewed personally. No infiltrate. Now on ceftriaxone for UTI. Will swith to levaquin to cover both urinary and respiratory pathogens. Volume status ok. Elevated ammonia suggests possible hepatic encephalopathy. Will get RUQ u/s to look for fatty liver. Will need SNF at discharge.   Glori Bickers, MD  4:59 PM

## 2017-08-27 NOTE — Plan of Care (Signed)
  Education: Ability to demonstrate management of disease process will improve 08/27/2017 1640 - Not Progressing by Abner Greenspan, RN   Education: Ability to verbalize understanding of medication therapies will improve 08/27/2017 1640 - Not Progressing by Abner Greenspan, RN

## 2017-08-28 ENCOUNTER — Inpatient Hospital Stay (HOSPITAL_COMMUNITY): Payer: PPO

## 2017-08-28 LAB — CBC WITH DIFFERENTIAL/PLATELET
BASOS PCT: 0 %
Basophils Absolute: 0 10*3/uL (ref 0.0–0.1)
Eosinophils Absolute: 0.2 10*3/uL (ref 0.0–0.7)
Eosinophils Relative: 2 %
HEMATOCRIT: 37.6 % (ref 36.0–46.0)
HEMOGLOBIN: 12 g/dL (ref 12.0–15.0)
Lymphocytes Relative: 22 %
Lymphs Abs: 2.5 10*3/uL (ref 0.7–4.0)
MCH: 30.4 pg (ref 26.0–34.0)
MCHC: 31.9 g/dL (ref 30.0–36.0)
MCV: 95.2 fL (ref 78.0–100.0)
MONOS PCT: 7 %
Monocytes Absolute: 0.8 10*3/uL (ref 0.1–1.0)
NEUTROS ABS: 7.8 10*3/uL — AB (ref 1.7–7.7)
NEUTROS PCT: 69 %
Platelets: 202 10*3/uL (ref 150–400)
RBC: 3.95 MIL/uL (ref 3.87–5.11)
RDW: 15.1 % (ref 11.5–15.5)
WBC: 11.4 10*3/uL — AB (ref 4.0–10.5)

## 2017-08-28 LAB — MAGNESIUM: MAGNESIUM: 2.2 mg/dL (ref 1.7–2.4)

## 2017-08-28 LAB — HEMOGLOBIN AND HEMATOCRIT, BLOOD
HEMATOCRIT: 37.7 % (ref 36.0–46.0)
HEMOGLOBIN: 12.2 g/dL (ref 12.0–15.0)

## 2017-08-28 LAB — BASIC METABOLIC PANEL
Anion gap: 11 (ref 5–15)
BUN: 36 mg/dL — ABNORMAL HIGH (ref 6–20)
CO2: 24 mmol/L (ref 22–32)
Calcium: 9.1 mg/dL (ref 8.9–10.3)
Chloride: 94 mmol/L — ABNORMAL LOW (ref 101–111)
Creatinine, Ser: 1.22 mg/dL — ABNORMAL HIGH (ref 0.44–1.00)
GFR calc Af Amer: 46 mL/min — ABNORMAL LOW (ref >60)
GFR calc non Af Amer: 40 mL/min — ABNORMAL LOW (ref >60)
Glucose, Bld: 106 mg/dL — ABNORMAL HIGH (ref 65–99)
Potassium: 4.9 mmol/L (ref 3.5–5.1)
Sodium: 129 mmol/L — ABNORMAL LOW (ref 135–145)

## 2017-08-28 LAB — AMMONIA: Ammonia: 48 umol/L — ABNORMAL HIGH (ref 9–35)

## 2017-08-28 MED ORDER — IOPAMIDOL (ISOVUE-300) INJECTION 61%
INTRAVENOUS | Status: AC
  Administered 2017-08-28: 80 mL
  Filled 2017-08-28: qty 100

## 2017-08-28 MED ORDER — TORSEMIDE 20 MG PO TABS
80.0000 mg | ORAL_TABLET | Freq: Every day | ORAL | Status: DC
  Administered 2017-08-28 – 2017-08-29 (×2): 80 mg via ORAL
  Filled 2017-08-28 (×2): qty 4

## 2017-08-28 MED ORDER — LACTULOSE 10 GM/15ML PO SOLN
30.0000 g | Freq: Three times a day (TID) | ORAL | Status: DC
  Administered 2017-08-28 (×2): 30 g via ORAL
  Filled 2017-08-28 (×4): qty 45

## 2017-08-28 MED ORDER — GABAPENTIN 400 MG PO CAPS
400.0000 mg | ORAL_CAPSULE | Freq: Three times a day (TID) | ORAL | Status: DC
  Administered 2017-08-28 – 2017-08-30 (×7): 400 mg via ORAL
  Filled 2017-08-28 (×9): qty 1

## 2017-08-28 MED ORDER — CEFUROXIME AXETIL 500 MG PO TABS
500.0000 mg | ORAL_TABLET | Freq: Two times a day (BID) | ORAL | Status: DC
  Administered 2017-08-28 – 2017-08-30 (×6): 500 mg via ORAL
  Filled 2017-08-28 (×6): qty 1

## 2017-08-28 MED ORDER — METOPROLOL TARTRATE 12.5 MG HALF TABLET
12.5000 mg | ORAL_TABLET | Freq: Once | ORAL | Status: AC
  Administered 2017-08-28: 12.5 mg via ORAL
  Filled 2017-08-28: qty 1

## 2017-08-28 MED ORDER — DOXYCYCLINE HYCLATE 100 MG PO TABS
100.0000 mg | ORAL_TABLET | Freq: Two times a day (BID) | ORAL | Status: DC
  Administered 2017-08-28 – 2017-08-30 (×5): 100 mg via ORAL
  Filled 2017-08-28 (×5): qty 1

## 2017-08-28 NOTE — Progress Notes (Signed)
MD states she will come see pt.
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
## 2017-08-28 NOTE — Progress Notes (Addendum)
Patient ID: Bianca [name], female   DOB: Sep 04, 1932, 84 y.o.   MRN: [id number]     Advanced Heart Failure Rounding Note  PCP-Cardiologist: Candee Furbish, MD   Subjective:    Diuretics held yesterday. Weights inaccurate. Creatinine 1.22, K 5.0  Abdominal US 3/12: 5 mm calcification versus tiny amount of gas in left lobe, small amount of free fluid on inferior aspect of liver. Ammonia 48 this am.  No CP, SOB, dizziness, or bleeding. She still has productive cough. She is interactive but confused this morning.   Objective:   Weight Range: 204 lb 11.2 oz (92.9 kg) Body mass index is 33.04 kg/m.   Vital Signs:   Temp:  [97.8 F (36.6 C)-98.2 F (36.8 C)] 98.2 F (36.8 C) (03/13 0551) Pulse Rate:  [64-96] 64 (03/13 0551) Resp:  [18] 18 (03/13 0551) BP: (102-117)/(62-71) 117/65 (03/13 0551) SpO2:  [95 %-100 %] 95 % (03/13 0551) FiO2 (%):  [2 %] 2 % (03/12 1258) Weight:  [204 lb 11.2 oz (92.9 kg)] 204 lb 11.2 oz (92.9 kg) (03/13 0551) Last BM Date: 08/27/17  Weight change: Filed Weights   08/26/17 0440 08/27/17 0546 08/28/17 0551  Weight: 219 lb 14.4 oz (99.7 kg) 219 lb 6.4 oz (99.5 kg) 204 lb 11.2 oz (92.9 kg)    Intake/Output:   Intake/Output Summary (Last 24 hours) at 08/28/2017 0801 Last data filed at 08/28/2017 0230 Gross per 24 hour  Intake 480 ml  Output 700 ml  Net -220 ml      Physical Exam   General: Elderly. In bed. No resp difficulty. HEENT: Normal Neck: Supple. JVP to jawline. Carotids 2+ bilat; no bruits. No thyromegaly or nodule noted. Cor: PMI nondisplaced. RRR, No M/G/R noted Lungs: Wheezes throughout. No crackles Abdomen: Soft, non-tender, non-distended, no HSM. No bruits or masses. +BS  Extremities: No cyanosis, clubbing, or rash. R and LLE nonpitting edema. No asterixis  Neuro: Oriented to self and place, cranial nerves grossly intact. moves all 4 extremities w/o difficulty. Confused.    Telemetry   Vpaced 90's. 7 beat run of NSVT.  Personally reviewed.   Labs    CBC Recent Labs    08/27/17 0535 08/28/17 0459  WBC 12.9* 11.4*  NEUTROABS 9.0* 7.8*  HGB 12.6 12.0  HCT 39.8 37.6  MCV 95.7 95.2  PLT 217 621   Basic Metabolic Panel Recent Labs    08/27/17 0535 08/28/17 0459  NA 130* 129*  K 5.0 4.9  CL 94* 94*  CO2 25 24  GLUCOSE 128* 106*  BUN 42* 36*  CREATININE 1.51* 1.22*  CALCIUM 8.9 9.1   Liver Function Tests Recent Labs    08/27/17 0535  AST 54*  ALT 25  ALKPHOS 68  BILITOT 1.8*  PROT 6.6  ALBUMIN 3.7   No results for input(s): LIPASE, AMYLASE in the last 72 hours. Cardiac Enzymes No results for input(s): CKTOTAL, CKMB, CKMBINDEX, TROPONINI in the last 72 hours.  BNP: BNP (last 3 results) Recent Labs    01/01/17 1339 07/30/17 0842 08/22/17 2115  BNP 975.5* 1,664.3* 962.0*    ProBNP (last 3 results) No results for input(s): PROBNP in the last 8760 hours.   D-Dimer No results for input(s): DDIMER in the last 72 hours. Hemoglobin A1C No results for input(s): HGBA1C in the last 72 hours. Fasting Lipid Panel No results for input(s): CHOL, HDL, LDLCALC, TRIG, CHOLHDL, LDLDIRECT in the last 72 hours. Thyroid Function Tests No results for input(s): TSH, T4TOTAL, T3FREE, THYROIDAB in  the last 72 hours.  Invalid input(s): FREET3  Other results:   Imaging    Dg Chest 2 View  Result Date: 08/27/2017 CLINICAL DATA:  Cough EXAM: CHEST - 2 VIEW COMPARISON:  August 22, 2017 FINDINGS: There is no edema or consolidation. There is cardiomegaly with pulmonary vascularity within normal limits. Pacemaker leads are attached to the right atrium, right ventricle, and coronary sinus. No pneumothorax. No adenopathy. There is degenerative change in the thoracic spine. IMPRESSION: Cardiomegaly. Pacemaker leads as described. No edema or consolidation. Electronically Signed   By: Lowella Grip III M.D.   On: 08/27/2017 12:13   US Abdomen Limited Ruq  Result Date: 08/27/2017 CLINICAL DATA:   84 year old female with headache and cephalopathy. Prior cholecystectomy. Initial encounter. EXAM: ULTRASOUND ABDOMEN LIMITED RIGHT UPPER QUADRANT COMPARISON:  10/25/2015 CT. FINDINGS: Gallbladder: Post cholecystectomy. Common bile duct: Diameter: 2.6 mm Liver: 5 mm calcification versus tiny amount of gas left lobe liver. No liver lesion otherwise noted. Portal vein is patent on color Doppler imaging with normal direction of blood flow towards the liver. Small amount of free fluid along the inferior aspect of the liver. Etiology indeterminate. IMPRESSION: Post cholecystectomy. 5 mm calcification versus tiny amount a gas left lobe liver. Small amount of free fluid along the inferior aspect the liver. Etiology indeterminate. These results will be called to the ordering clinician or representative by the Radiologist Assistant, and communication documented in the PACS or zVision Dashboard. Electronically Signed   By: Genia Del M.D.   On: 08/27/2017 12:42     Medications:     Scheduled Medications: . allopurinol  400 mg Oral Daily  . apixaban  5 mg Oral BID  . aspirin  81 mg Oral Daily  . atorvastatin  40 mg Oral Daily  . chlorhexidine  15 mL Mouth Rinse BID  . cholecalciferol  1,000 Units Oral Daily  . clopidogrel  75 mg Oral Q breakfast  . colestipol  2 g Oral QHS  . gabapentin  400 mg Oral BID  . isosorbide mononitrate  15 mg Oral Daily  . lactulose  20 g Oral TID  . mouth rinse  15 mL Mouth Rinse q12n4p  . metoprolol succinate  25 mg Oral Daily  . nortriptyline  25 mg Oral QHS  . polyethylene glycol  17 g Oral Daily    Infusions: . levofloxacin (LEVAQUIN) IV Stopped (08/27/17 1717)    PRN Medications: acetaminophen, albuterol, docusate sodium, ondansetron (ZOFRAN) IV, senna-docusate    Assessment/Plan   1. CAD: Admitted with chest pain, TnI 0.11=>0.13.  Suspect demand ischemia with mild volume overload.   On 3/5, had cath with DES to severe pLAD stenosis.   - Continue  ASA +  Plavix x 1 month along with her Eliquis. Then would stop ASA after 1 month4/10/2017.  - Continue statin.  - No further CP 2.Chronic systolicEF 65% s/p BiV ICD - [employer]: NICM originally, now with mixed ischemic cardiomyoapthy component as above.CI 1.9 on cath 3/5. Echo 04/2017 LVEF 20%, Grade 2 DD. She was admitted with volume overload.  - Volume status elevated on exam. Restart torsemide 80 mg daily. Takes torsemide 80/40 at home.  - Continue toprol xl 25 mg daily.  - Hold spiro 25 mg daily with hyperkalemia - no aceI or entresto with allergy. Possible losartan in future.  3. Paroxysmal AF: CHA2DS2-VASc 6.NSR here.  No AF on ICDinterpretation 08/23/2017.  - On Eliquis 5 mg twice a day.No s/s bleeding 4. Morbid obesity with h/o CO2  retention- using oxygen at night: Not on chronic O2 during the day. Has not been show to de-saturate with ambulation.No change. 5. R Flank Hematoma 6. VT: x 4 since Feb 4th, each time with successful ATP. - 7 beats NSVT overnight. Did not need ATP. K is 4.9. Will add on mag.  - Continue Toprol XL 25 mg daily.  7. Urinary retention: Foley now out. Being treated for UTI. ABX broadened to also cover PNA. 8. Delirium: UA was borderline for UTI but culture with no growth.  Will treat as UTI with short course of antibiotics.  - Ammonia 50 raises suspicion of possible hepatic encephalopathy. - Abdominal US 3/12: 5 mm calcification versus tiny amount of gas in left lobe, small amount of free fluid on inferior aspect of liver. Ammonia 48 this morning. On lactulose 20 grams TID.  - Remains confused this morning. Will discuss with PharmD ? increasing lactulose. 9. Deconditioning - PT recommending HHPT with 24 hour supervision vs short term rehab. CSW following with bed offers for SNF at [place]. No change.  10. AKI: baseline is 1.0-1.1. Creatinine peak 1.59 - Creatinine 1.2 this am.  11. Constipation:  - Resolved 12. ID - Was being treated for UTI with Ceftriazone >  now on Levaquin cover PNA with productive cough yesterday. CXR negative for infiltrates. - WBC trending down. Remains afebrile.   Length of Stay: [place], NP  08/28/2017, 8:01 AM  Advanced Heart Failure Team Pager [phone] (M-F; 7a - 4p)  Please contact [facility] for night-coverage after hours (4p -7a ) and weekends on [email or web address]  Patient seen and examined with the above-signed Advanced Practice Provider and/or Housestaff. I personally reviewed laboratory data, imaging studies and relevant notes. I independently examined the patient and formulated the important aspects of the plan. I have edited the note to reflect any of my changes or salient points. I have personally discussed the plan with the patient and/or family.  Was confused this am but now much improved. Volume status looks good. No CP or other ischemic symptoms. Tolerating ASA/Plavix/Eliquis. Ammonia mildly elevated. Ab u/s no evidence of cirrhosis. GI consulted and feels hepatic encephalopathy less likely. Ab CT reviewed personally and was normal.   [facility] can get her to SNF in am. Discussed with her and her son personally.   Glori Bickers, MD  9:50 PM

## 2017-08-28 NOTE — Progress Notes (Signed)
Pt's son requests all four rails up for his mother's safety.

## 2017-08-28 NOTE — Clinical Social Work Note (Addendum)
Please notify CSW at least 24 hours in advance of discharge date so that bed can be secured and insurance authorization started.  Dayton Scrape, [place] [phone]  2:22 pm Per consult, patient will likely discharge tomorrow, maybe Friday depending on GI recommendations. Patient's son stated first preference is now [facility]. They can offer a bed either day. CSW called and started insurance authorization with Tammy.  Dayton Scrape, [place]

## 2017-08-28 NOTE — Progress Notes (Signed)
Pt had large stool. Bright red blood was seen when wiping pt. MD notified.

## 2017-08-28 NOTE — Progress Notes (Addendum)
Tele states pt had 18 beats of vtach and pacemaker fired. Pt is asymptomatic. MD notified. No new orders.

## 2017-08-28 NOTE — Plan of Care (Signed)
  Nutrition: Adequate nutrition will be maintained 08/28/2017 1942 - Progressing by Berma Harts A, RN   Safety: Ability to remain free from injury will improve 08/28/2017 1942 - Progressing by Umar Patmon, Roma Kayser, RN

## 2017-08-28 NOTE — Progress Notes (Signed)
Physical Therapy Treatment Patient Details Name: Bianca [name] MRN: [id number] DOB: [birth year] Today's Date: 08/28/2017    History of Present Illness Pt is an 84 y/o female withnonischemic cardiomyopathy, hx of atrial fibrillation-currently with chronic anticoagulation, hx of Chronic kidney disease-stage III, chronic systolic heart failure-status post Bi- ventricular ICD-followed by Dr. Lovena Le.Pt with recent admission at the beginning of this month and returns with chest pain. Troponin elevated however felt to be demand ischemia.     PT Comments    Pt remains to require max cues for encouragement to participate in therapies.  Pt does have some recall of events from yesterday but unable to answer orientation questions.  Pt required increased assistance with transfer activities.  Pt tolerated session with mild fatigue.  Pt continues to require 24 hour assistance at home.   Follow Up Recommendations  Home health PT;Supervision/Assistance - 24 hour;Other (comment)(Family reports they will hire 24 hour assistance, if she does not have 24 hour assistance she will require SNF.  )     Equipment Recommendations  None recommended by PT    Recommendations for Other Services       Precautions / Restrictions Precautions Precautions: Fall Precaution Comments: Bilat foot peripheral neuropathy Required Braces or Orthoses: Other Brace/Splint Other Brace/Splint: performs better with her shoes on to walk Restrictions Weight Bearing Restrictions: No RUE Weight Bearing: Non weight bearing    Mobility  Bed Mobility Overal bed mobility: Needs Assistance Bed Mobility: Rolling;Sidelying to Sit Rolling: Mod assist Sidelying to sit: Mod assist       General bed mobility comments: Pt required assistance to elevate trunk into sitting, Pt presents with LOB in sitting.    Transfers Overall transfer level: Needs assistance Equipment used: 4-wheeled walker Transfers: Sit to/from Stand Sit to  Stand: Min assist;Mod assist         General transfer comment: Pt remains to require cues for hand placement to and from seated surface.  Pt performed multiple transfers assist level varried during session.      Ambulation/Gait Ambulation/Gait assistance: Min assist;+2 safety/equipment Ambulation Distance (Feet): 30 Feet(x2 trials) Assistive device: 4-wheeled walker Gait Pattern/deviations: Step-through pattern;Trunk flexed;Shuffle Gait velocity: Decreased   General Gait Details: Required max cues for encouragement to participate in gait training, cues to step closer to rollator and increase step length and foot clearance.  Cues for forward gaze and rollator safety.     Stairs            Wheelchair Mobility    Modified Rankin (Stroke Patients Only)       Balance Overall balance assessment: Needs assistance Sitting-balance support: No upper extremity supported;Feet supported Sitting balance-Leahy Scale: Fair       Standing balance-Leahy Scale: Poor Standing balance comment: Reliant on UE support;                             Cognition Arousal/Alertness: Awake/alert Behavior During Therapy: Agitated Overall Cognitive Status: Impaired/Different from baseline Area of Impairment: Orientation;Attention;Memory;Following commands;Safety/judgement;Awareness;Problem solving                 Orientation Level: Disoriented to;Time;Situation;Place Current Attention Level: Sustained Memory: Decreased short-term memory Following Commands: Follows one step commands with increased time Safety/Judgement: Decreased awareness of safety;Decreased awareness of deficits Awareness: Intellectual Problem Solving: Difficulty sequencing;Requires verbal cues;Requires tactile cues;Slow processing;Decreased initiation General Comments: Pt remember working with PTA yesterday but patient unable to answer orientation questions correctly.  Exercises      General  Comments        Pertinent Vitals/Pain Pain Assessment: 0-10 Pain Score: 5  Pain Location: Bilateral knees and feet; she attributes to neuropathy Pain Descriptors / Indicators: Aching;Burning Pain Intervention(s): Monitored during session;Repositioned    Home Living                      Prior Function            PT Goals (current goals can now be found in the care plan section) Acute Rehab PT Goals Patient Stated Goal: return home Potential to Achieve Goals: Good Progress towards PT goals: Progressing toward goals    Frequency    Min 3X/week      PT Plan Current plan remains appropriate    Co-evaluation              AM-PAC PT "6 Clicks" Daily Activity  Outcome Measure  Difficulty turning over in bed (including adjusting bedclothes, sheets and blankets)?: A Lot Difficulty moving from lying on back to sitting on the side of the bed? : A Lot Difficulty sitting down on and standing up from a chair with arms (e.g., wheelchair, bedside commode, etc,.)?: Unable Help needed moving to and from a bed to chair (including a wheelchair)?: A Little Help needed walking in hospital room?: A Little Help needed climbing 3-5 steps with a railing? : A Little 6 Click Score: 14    End of Session Equipment Utilized During Treatment: Gait belt Activity Tolerance: Patient limited by fatigue Patient left: in bed;with call bell/phone within reach;Other (comment) Nurse Communication: Mobility status PT Visit Diagnosis: Other abnormalities of gait and mobility (R26.89);Muscle weakness (generalized) (M62.81)     Time: 4854-6270 PT Time Calculation (min) (ACUTE ONLY): 31 min  Charges:  $Gait Training: 8-22 mins $Therapeutic Activity: 8-22 mins                    G Codes:       Governor Rooks, PTA pager [phone]    Cristela Blue 08/28/2017, 11:56 AM

## 2017-08-28 NOTE — Progress Notes (Signed)
-   discussed with Judson Roch, RN. Patient had a large bowel movement. Bright red blood noted on the tissue paper after wiping. No blood in the stool. - We will check H&H every 12 hours. CBC in the morning.  Otis Brace MD, [place] 08/28/2017, 3:31 PM  Contact #  [phone]

## 2017-08-28 NOTE — Progress Notes (Signed)
    Notified by RN that telemetry called reporting VT and ICD firing. Telemetry review and patient had 18 beats of VT, followed by normal paced rhythm. Patient resting comfortably in bed and without complaints of palpitations. She denies ICD firing; states it has never fired since she's had it. Reports feeling improved since presentation to the hospital.   Electrolytes are wnl today. Will given 12.5mg  metoprolol this evening. Can consider EP evaluation in AM.   Roby Lofts, PA-C [phone]

## 2017-08-29 ENCOUNTER — Ambulatory Visit: Payer: Self-pay

## 2017-08-29 ENCOUNTER — Inpatient Hospital Stay (HOSPITAL_COMMUNITY): Payer: PPO

## 2017-08-29 DIAGNOSIS — I472 Ventricular tachycardia: Secondary | ICD-10-CM

## 2017-08-29 LAB — HEPATIC FUNCTION PANEL
ALBUMIN: 3.7 g/dL (ref 3.5–5.0)
ALT: 51 U/L (ref 14–54)
AST: 53 U/L — ABNORMAL HIGH (ref 15–41)
Alkaline Phosphatase: 76 U/L (ref 38–126)
Bilirubin, Direct: 0.3 mg/dL (ref 0.1–0.5)
Indirect Bilirubin: 1 mg/dL — ABNORMAL HIGH (ref 0.3–0.9)
TOTAL PROTEIN: 7 g/dL (ref 6.5–8.1)
Total Bilirubin: 1.3 mg/dL — ABNORMAL HIGH (ref 0.3–1.2)

## 2017-08-29 LAB — HEMOGLOBIN AND HEMATOCRIT, BLOOD
HEMATOCRIT: 37.9 % (ref 36.0–46.0)
Hemoglobin: 12 g/dL (ref 12.0–15.0)

## 2017-08-29 LAB — MAGNESIUM: MAGNESIUM: 2.1 mg/dL (ref 1.7–2.4)

## 2017-08-29 LAB — BASIC METABOLIC PANEL
ANION GAP: 12 (ref 5–15)
BUN: 34 mg/dL — ABNORMAL HIGH (ref 6–20)
CO2: 23 mmol/L (ref 22–32)
Calcium: 8.8 mg/dL — ABNORMAL LOW (ref 8.9–10.3)
Chloride: 96 mmol/L — ABNORMAL LOW (ref 101–111)
Creatinine, Ser: 1.2 mg/dL — ABNORMAL HIGH (ref 0.44–1.00)
GFR, EST AFRICAN AMERICAN: 47 mL/min — AB (ref >60)
GFR, EST NON AFRICAN AMERICAN: 41 mL/min — AB (ref >60)
Glucose, Bld: 119 mg/dL — ABNORMAL HIGH (ref 65–99)
Potassium: 3.9 mmol/L (ref 3.5–5.1)
SODIUM: 131 mmol/L — AB (ref 135–145)

## 2017-08-29 LAB — CBC WITH DIFFERENTIAL/PLATELET
BASOS ABS: 0 10*3/uL (ref 0.0–0.1)
Basophils Relative: 0 %
EOS PCT: 1 %
Eosinophils Absolute: 0.1 10*3/uL (ref 0.0–0.7)
HCT: 37.4 % (ref 36.0–46.0)
Hemoglobin: 11.9 g/dL — ABNORMAL LOW (ref 12.0–15.0)
LYMPHS PCT: 20 %
Lymphs Abs: 2.2 10*3/uL (ref 0.7–4.0)
MCH: 30.1 pg (ref 26.0–34.0)
MCHC: 31.8 g/dL (ref 30.0–36.0)
MCV: 94.7 fL (ref 78.0–100.0)
MONOS PCT: 8 %
Monocytes Absolute: 0.9 10*3/uL (ref 0.1–1.0)
Neutro Abs: 7.7 10*3/uL (ref 1.7–7.7)
Neutrophils Relative %: 71 %
Platelets: 213 10*3/uL (ref 150–400)
RBC: 3.95 MIL/uL (ref 3.87–5.11)
RDW: 15.2 % (ref 11.5–15.5)
WBC: 11 10*3/uL — ABNORMAL HIGH (ref 4.0–10.5)

## 2017-08-29 LAB — PROTIME-INR
INR: 1.81
Prothrombin Time: 20.8 seconds — ABNORMAL HIGH (ref 11.4–15.2)

## 2017-08-29 MED ORDER — FUROSEMIDE 10 MG/ML IJ SOLN
80.0000 mg | Freq: Once | INTRAMUSCULAR | Status: AC
  Administered 2017-08-29: 80 mg via INTRAVENOUS
  Filled 2017-08-29: qty 8

## 2017-08-29 MED ORDER — POTASSIUM CHLORIDE CRYS ER 20 MEQ PO TBCR
20.0000 meq | EXTENDED_RELEASE_TABLET | Freq: Once | ORAL | Status: AC
  Administered 2017-08-29: 20 meq via ORAL
  Filled 2017-08-29: qty 1

## 2017-08-29 NOTE — Progress Notes (Signed)
[facility]astroenterology Progress Note  Bianca [name] 84 y.o. 1933/02/06  CC:  Possible hepatic encephalopathy   Subjective: Patient is more alert and oriented today. Denies abdominal pain, nausea or vomiting. Denied any blood in the stool today. Complaining of generalized weakness  ROS : Positive for generalized weakness and fatigue. Positive for mild shortness of breath. Negative for chest pain   Objective: Vital signs in last 24 hours: Vitals:   08/29/17 0810 08/29/17 1148  BP: 99/76 (!) 101/59  Pulse: 85 85  Resp:  18  Temp:  97.8 F (36.6 C)  SpO2:  93%    Physical Exam: Elderly-appearing patient. Resting comfortably in the recliner. Not in acute distress Cardiovascular: Normal rate and regular rhythm. Murmur heard. Pulmonary/Chest: Effort normal and breath sounds normal. No respiratory distress.  Abdominal: Soft. Bowel sounds are normal. She exhibits no distension. There is no tenderness. There is no rebound and no guarding.  Musculoskeletal: LE edema noted.  Neurological: Alert oriented  3      Lab Results: Recent Labs    08/28/17 0459 08/29/17 0726  NA 129* 131*  K 4.9 3.9  CL 94* 96*  CO2 24 23  GLUCOSE 106* 119*  BUN 36* 34*  CREATININE 1.22* 1.20*  CALCIUM 9.1 8.8*  MG 2.2 2.1   Recent Labs    08/27/17 0535 08/29/17 0551  AST 54* 53*  ALT 25 51  ALKPHOS 68 76  BILITOT 1.8* 1.3*  PROT 6.6 7.0  ALBUMIN 3.7 3.7   Recent Labs    08/28/17 0459 08/28/17 1603 08/29/17 0551  WBC 11.4*  --  11.0*  NEUTROABS 7.8*  --  7.7  HGB 12.0 12.2 11.9*  HCT 37.6 37.7 37.4  MCV 95.2  --  94.7  PLT 202  --  213   Recent Labs    08/29/17 0551  LABPROT 20.8*  INR 1.81      Assessment/Plan: - Intermittent confusion. Mild  elevated ammonia. Less likely hepatic encephalopathy. - 1 episode of blood on the tissue paper without any bright red blood per rectum. Resolved. Hemoglobin relatively stable.  - Abnormal ultrasound showed 5 mm calcification  versus tiny amount a gas left lobe liver. Small amount of free fluid along the inferior aspect the liver. Etiology indeterminate. Follow-up CT scan negative.  - History of coronary artery disease status post PCI earlier this month. Currently on aspirin and Plavix. -  paroxysmal atrial fibrillation. On Eliquis  - Cardia myopathy with EF of 20%.   Recommendations ----------------------- -  CT abdomen with IV contrast negative. No evidence of liver disease. No acute intra-abdominal pathology. -  patient's intermittent confusion is multifactorial but it's less likely from hepatic encephalopathy. Patient has no evidence of liver cirrhosis.  - GI will sign off. Call us back if needed   Otis Brace MD, [place] 08/29/2017, 12:45 PM  Contact #  [phone]

## 2017-08-29 NOTE — Progress Notes (Signed)
Physical Therapy Treatment Patient Details Name: Bianca [name] MRN: [id number] DOB: 05/01/1933 Today's Date: 08/29/2017    History of Present Illness Pt is an 84 y/o female withnonischemic cardiomyopathy, hx of atrial fibrillation-currently with chronic anticoagulation, hx of Chronic kidney disease-stage III, chronic systolic heart failure-status post Bi- ventricular ICD-followed by Dr. Lovena Le.Pt with recent admission at the beginning of this month and returns with chest pain. Troponin elevated however felt to be demand ischemia.     PT Comments    Pt making steady progress but fatigues quickly and feel pt will need ST-SNF prior to return home.   Follow Up Recommendations  SNF     Equipment Recommendations  None recommended by PT    Recommendations for Other Services       Precautions / Restrictions Precautions Precautions: Fall Precaution Comments: Bilat foot peripheral neuropathy Required Braces or Orthoses: Other Brace/Splint Other Brace/Splint: performs better with her shoes on to walk Restrictions Weight Bearing Restrictions: No    Mobility  Bed Mobility               General bed mobility comments: up in recliner  Transfers Overall transfer level: Needs assistance Equipment used: 4-wheeled walker Transfers: Sit to/from Stand Sit to Stand: Min assist         General transfer comment: Assist for balance and support  Ambulation/Gait Ambulation/Gait assistance: Min assist Ambulation Distance (Feet): 70 Feet Assistive device: 4-wheeled walker Gait Pattern/deviations: Step-through pattern;Decreased step length - right;Decreased step length - left;Shuffle;Trunk flexed Gait velocity: decr Gait velocity interpretation: Below normal speed for age/gender General Gait Details: Assist for balance and support.    Stairs            Wheelchair Mobility    Modified Rankin (Stroke Patients Only)       Balance Overall balance assessment: Needs  assistance Sitting-balance support: No upper extremity supported;Feet supported Sitting balance-Leahy Scale: Fair     Standing balance support: Bilateral upper extremity supported Standing balance-Leahy Scale: Poor Standing balance comment: UE support and min guard for static standing                            Cognition Arousal/Alertness: Awake/alert Behavior During Therapy: WFL for tasks assessed/performed Overall Cognitive Status: Impaired/Different from baseline Area of Impairment: Orientation;Attention;Memory;Following commands;Safety/judgement;Awareness;Problem solving                 Orientation Level: Disoriented to;Time;Situation Current Attention Level: Sustained Memory: Decreased short-term memory Following Commands: Follows one step commands with increased time Safety/Judgement: Decreased awareness of safety;Decreased awareness of deficits Awareness: Intellectual Problem Solving: Difficulty sequencing;Requires verbal cues;Requires tactile cues General Comments: lethargic in at start and end of session in recliner, conversing with eyes closed, smiling. More alert sitting up in guest chair at sink, eyes staying open.      Exercises      General Comments        Pertinent Vitals/Pain Pain Assessment: Faces Faces Pain Scale: Hurts little more Pain Location: Bilateral knees and feet; she attributes to neuropathy Pain Descriptors / Indicators: Aching;Burning Pain Intervention(s): Limited activity within patient's tolerance;Monitored during session    Home Living                      Prior Function            PT Goals (current goals can now be found in the care plan section) Acute Rehab PT Goals Patient Stated Goal:  return home Progress towards PT goals: Progressing toward goals    Frequency    Min 2X/week      PT Plan Discharge plan needs to be updated;Frequency needs to be updated    Co-evaluation               AM-PAC PT "6 Clicks" Daily Activity  Outcome Measure  Difficulty turning over in bed (including adjusting bedclothes, sheets and blankets)?: Unable Difficulty moving from lying on back to sitting on the side of the bed? : Unable Difficulty sitting down on and standing up from a chair with arms (e.g., wheelchair, bedside commode, etc,.)?: Unable Help needed moving to and from a bed to chair (including a wheelchair)?: A Little Help needed walking in hospital room?: A Little Help needed climbing 3-5 steps with a railing? : Total 6 Click Score: 10    End of Session Equipment Utilized During Treatment: Gait belt Activity Tolerance: Patient limited by fatigue Patient left: in chair;with call bell/phone within reach;with chair alarm set Nurse Communication: Mobility status PT Visit Diagnosis: Other abnormalities of gait and mobility (R26.89);Muscle weakness (generalized) (M62.81)     Time: 1212-1228 PT Time Calculation (min) (ACUTE ONLY): 16 min  Charges:  $Gait Training: 8-22 mins                    G Codes:       [facility] PT [place] 08/29/2017, 1:59 PM

## 2017-08-29 NOTE — Progress Notes (Signed)
Pt sleeping, RT attempted to place home CPAP. Able to wake pt, but when I ask if she wants me to help her put on her machine, she says no. RT will continue to monitor.

## 2017-08-29 NOTE — Progress Notes (Signed)
Pt states she "just feels bad all over." Pt SpO2= 91% on room air. SpO2= 96% on 2L nasal cannula. MD notified. No new orders.

## 2017-08-29 NOTE — Progress Notes (Signed)
Occupational Therapy Treatment Patient Details Name: Bianca [name] MRN: [id number] DOB: Apr 09, 1933 Today's Date: 08/29/2017    History of present illness Pt is an 84 y/o female withnonischemic cardiomyopathy, hx of atrial fibrillation-currently with chronic anticoagulation, hx of Chronic kidney disease-stage III, chronic systolic heart failure-status post Bi- ventricular ICD-followed by Dr. Lovena Le.Pt with recent admission at the beginning of this month and returns with chest pain. Troponin elevated however felt to be demand ischemia.    OT comments  Pt making steady progress towards acute OT goals. Focus of session was grooming tasks in sit<>stand. Pt noted to be lethargic at start and end of session (eyes closed while talking). More alert during therapy tasks. Son present at start and end of session. Family feels pt needs rehab at SNF prior to returning home. D/c plan updated to SNF.   Follow Up Recommendations  SNF    Equipment Recommendations  Other (comment)(TBD next venue)    Recommendations for Other Services      Precautions / Restrictions Precautions Precautions: Fall Precaution Comments: Bilat foot peripheral neuropathy Required Braces or Orthoses: Other Brace/Splint Other Brace/Splint: performs better with her shoes on to walk Restrictions Weight Bearing Restrictions: No       Mobility Bed Mobility               General bed mobility comments: up in recliner  Transfers Overall transfer level: Needs assistance Equipment used: 4-wheeled walker Transfers: Sit to/from Stand Sit to Stand: Min assist;Mod assist         General transfer comment: Mod A for first sit<>stand from recliner, min-mod A for subsequent transfers.  Cues for sequencing movements (scoot hips forward before standing). Assist to control descent.     Balance Overall balance assessment: Needs assistance Sitting-balance support: No upper extremity supported;Feet supported Sitting  balance-Leahy Scale: Fair     Standing balance support: Single extremity supported;Bilateral upper extremity supported;During functional activity Standing balance-Leahy Scale: Poor Standing balance comment: Reliant on UE support; used sink or rw for support during session.                            ADL either performed or assessed with clinical judgement   ADL Overall ADL's : Needs assistance/impaired     Grooming: Wash/dry hands;Oral care;Min guard;Sitting Grooming Details (indicate cue type and reason): setup assist for grooming tasks, min A for sit<>stand transfer, sat to brush teeth, stood to finish oral care and wash hands. Leaning into sink for support.                             Functional mobility during ADLs: Minimal assistance;Rolling walker General ADL Comments: Pt walked to/from sink to complete 2 grooming tasks in sit<>stand. 2 seated rest breaks incorporated. Min A to steady during transfers, min guard to min A for mobility.     Vision       Perception     Praxis      Cognition Arousal/Alertness: Awake/alert;Lethargic Behavior During Therapy: WFL for tasks assessed/performed Overall Cognitive Status: Impaired/Different from baseline Area of Impairment: Orientation;Attention;Memory;Following commands;Safety/judgement;Awareness;Problem solving                   Current Attention Level: Sustained Memory: Decreased short-term memory Following Commands: Follows one step commands with increased time Safety/Judgement: Decreased awareness of safety;Decreased awareness of deficits Awareness: Intellectual Problem Solving: Difficulty sequencing;Requires verbal cues;Requires tactile cues;Slow processing;Decreased  initiation General Comments: lethargic in at start and end of session in recliner, conversing with eyes closed, smiling. More alert sitting up in guest chair at sink, eyes staying open.        Exercises     Shoulder  Instructions       General Comments      Pertinent Vitals/ Pain       Pain Assessment: Faces Faces Pain Scale: Hurts little more Pain Location: B knees Pain Descriptors / Indicators: Aching;Sore Pain Intervention(s): Monitored during session;Limited activity within patient's tolerance  Home Living                                          Prior Functioning/Environment              Frequency  Min 3X/week        Progress Toward Goals  OT Goals(current goals can now be found in the care plan section)  Progress towards OT goals: Progressing toward goals  Acute Rehab OT Goals Patient Stated Goal: return home OT Goal Formulation: With patient/family Time For Goal Achievement: 09/09/17 Potential to Achieve Goals: Good ADL Goals Pt Will Perform Grooming: with modified independence Pt Will Perform Upper Body Bathing: with set-up Pt Will Perform Lower Body Bathing: with set-up;with supervision;sit to/from stand Pt Will Perform Upper Body Dressing: with modified independence Pt Will Transfer to Toilet: ambulating;bedside commode;with min guard assist Pt Will Perform Toileting - Clothing Manipulation and hygiene: with set-up;with supervision;sitting/lateral leans  Plan Discharge plan needs to be updated    Co-evaluation                 AM-PAC PT "6 Clicks" Daily Activity     Outcome Measure   Help from another person eating meals?: A Little Help from another person taking care of personal grooming?: A Little Help from another person toileting, which includes using toliet, bedpan, or urinal?: A Lot Help from another person bathing (including washing, rinsing, drying)?: A Lot Help from another person to put on and taking off regular upper body clothing?: A Lot Help from another person to put on and taking off regular lower body clothing?: A Lot 6 Click Score: 14    End of Session Equipment Utilized During Treatment: Gait belt;Other  (comment)(rollator)  OT Visit Diagnosis: Unsteadiness on feet (R26.81);Other abnormalities of gait and mobility (R26.89);Muscle weakness (generalized) (M62.81);Repeated falls (R29.6);History of falling (Z91.81);Other symptoms and signs involving cognitive function Pain - Right/Left: Right Pain - part of body: Knee;Leg;Ankle and joints of foot   Activity Tolerance Patient limited by lethargy;Patient tolerated treatment well   Patient Left in chair;with call bell/phone within reach;with family/visitor present   Nurse Communication          Time: 1000-1027 OT Time Calculation (min): 27 min  Charges: OT General Charges $OT Visit: 1 Visit OT Treatments $Self Care/Home Management : 23-37 mins     Hortencia Pilar 08/29/2017, 10:49 AM

## 2017-08-29 NOTE — Progress Notes (Signed)
Came to check on patient this afternoon. Her son says that she walked to the sink with OT, then felt badly and asked to lie down in bed. She appears very fatigued and is now on 2 L O2. She is afebrile. On assessment, she has crackles in bases of her lungs and wheezes throughout. She says she feels very weak and a little SOB. No CP. Will check portable CXR and order 80 mg IV lasix.   Georgiana Shore, NP

## 2017-08-29 NOTE — Progress Notes (Signed)
Pt on her Cpap tolerating well at this time

## 2017-08-29 NOTE — Progress Notes (Signed)
Pt up to chair to eat dinner. RN changed pt's bed d/t wet sheets. Pt states she does not need anything else at this time.

## 2017-08-29 NOTE — Progress Notes (Addendum)
Patient ID: Bianca [name], female   DOB: 1932/07/23, 84 y.o.   MRN: [id number]     Advanced Heart Failure Rounding Note  PCP-Cardiologist: Candee Furbish, MD   Subjective:    -540 mls with torsemide 80 mg. Weights inaccurate. Had 18 beat run of NSVT overnight. Had a small amount of blood on toilet paper after BM yesterday. Hemoglobin stable.   No CP or SOB. More confused this morning.    Objective:   Weight Range: 218 lb 4.8 oz (99 kg) Body mass index is 35.23 kg/m.   Vital Signs:   Temp:  [97.7 F (36.5 C)-98.8 F (37.1 C)] 98.4 F (36.9 C) (03/13 2006) Pulse Rate:  [85-95] 85 (03/13 2006) Resp:  [14-16] 16 (03/13 1835) BP: (101-115)/(67-80) 115/79 (03/13 2006) SpO2:  [94 %-96 %] 94 % (03/13 2006) Weight:  [218 lb 4.8 oz (99 kg)] 218 lb 4.8 oz (99 kg) (03/14 0508) Last BM Date: 08/27/17  Weight change: Filed Weights   08/27/17 0546 08/28/17 0551 08/29/17 0508  Weight: 219 lb 6.4 oz (99.5 kg) 204 lb 11.2 oz (92.9 kg) 218 lb 4.8 oz (99 kg)    Intake/Output:   Intake/Output Summary (Last 24 hours) at 08/29/2017 0714 Last data filed at 08/29/2017 0600 Gross per 24 hour  Intake 360 ml  Output 900 ml  Net -540 ml      Physical Exam  General: Elderly. Lying flat in bed. No resp difficulty. HEENT: Normal Neck: Supple. JVP 8-10. Carotids 2+ bilat; no bruits. No thyromegaly or nodule noted. Cor: PMI nondisplaced. RRR, No M/G/R noted Lungs: expiratory wheezes throughout Abdomen: Soft, non-tender, non-distended, no HSM. No bruits or masses. +BS  Extremities: No cyanosis, clubbing, or rash. R and LLE no edema.  Neuro: Disoriented to year and situation, cranial nerves grossly intact. moves all 4 extremities w/o difficulty. Confused.    Telemetry   Vpaced 90's. 18 beat run of NSVT (rate 110's). Personally reviewed.   Labs    CBC Recent Labs    08/27/17 0535 08/28/17 0459 08/28/17 1603  WBC 12.9* 11.4*  --   NEUTROABS 9.0* 7.8*  --   HGB 12.6 12.0 12.2  HCT  39.8 37.6 37.7  MCV 95.7 95.2  --   PLT 217 202  --    Basic Metabolic Panel Recent Labs    08/27/17 0535 08/28/17 0459  NA 130* 129*  K 5.0 4.9  CL 94* 94*  CO2 25 24  GLUCOSE 128* 106*  BUN 42* 36*  CREATININE 1.51* 1.22*  CALCIUM 8.9 9.1  MG  --  2.2   Liver Function Tests Recent Labs    08/27/17 0535  AST 54*  ALT 25  ALKPHOS 68  BILITOT 1.8*  PROT 6.6  ALBUMIN 3.7   No results for input(s): LIPASE, AMYLASE in the last 72 hours. Cardiac Enzymes No results for input(s): CKTOTAL, CKMB, CKMBINDEX, TROPONINI in the last 72 hours.  BNP: BNP (last 3 results) Recent Labs    01/01/17 1339 07/30/17 0842 08/22/17 2115  BNP 975.5* 1,664.3* 962.0*    ProBNP (last 3 results) No results for input(s): PROBNP in the last 8760 hours.   D-Dimer No results for input(s): DDIMER in the last 72 hours. Hemoglobin A1C No results for input(s): HGBA1C in the last 72 hours. Fasting Lipid Panel No results for input(s): CHOL, HDL, LDLCALC, TRIG, CHOLHDL, LDLDIRECT in the last 72 hours. Thyroid Function Tests No results for input(s): TSH, T4TOTAL, T3FREE, THYROIDAB in the last 72 hours.  Invalid input(s): FREET3  Other results:   Imaging    Ct Abdomen W Contrast  Result Date: 08/28/2017 CLINICAL DATA:  Abnormal liver function.  Hepatic encephalopathy EXAM: CT ABDOMEN WITH CONTRAST TECHNIQUE: Multidetector CT imaging of the abdomen was performed using the standard protocol following bolus administration of intravenous contrast. CONTRAST:  34mL ISOVUE-300 IOPAMIDOL (ISOVUE-300) INJECTION 61% COMPARISON:  CT 10/25/2015 FINDINGS: Lower chest:  Lung bases are clear.  Heart is enlarged. Hepatobiliary: No focal lesion within the liver. No ascites. Postcholecystectomy. No biliary duct dilatation Pancreas: Normal pancreatic parenchymal intensity. No ductal dilatation or inflammation. Spleen: Normal spleen. Adrenals/urinary tract: Adrenal glands and kidneys are normal. Stomach/Bowel:  Stomach and limited view of the bowel is unremarkable. There is a laxity of the RIGHT abdominal wall with some shifting of the intraperitoneal contents rightward. Vascular/Lymphatic: No abdominal aneurysm. No retroperitoneal adenopathy Musculoskeletal: No aggressive osseous lesion IMPRESSION: 1. No focal hepatic lesion.  No ascites. 2. No biliary obstruction. 3. Cardiomegaly Electronically Signed   By: Suzy Bouchard M.D.   On: 08/28/2017 19:31     Medications:     Scheduled Medications: . allopurinol  400 mg Oral Daily  . apixaban  5 mg Oral BID  . aspirin  81 mg Oral Daily  . atorvastatin  40 mg Oral Daily  . cefUROXime  500 mg Oral BID WC  . chlorhexidine  15 mL Mouth Rinse BID  . cholecalciferol  1,000 Units Oral Daily  . clopidogrel  75 mg Oral Q breakfast  . colestipol  2 g Oral QHS  . doxycycline  100 mg Oral Q12H  . gabapentin  400 mg Oral TID  . isosorbide mononitrate  15 mg Oral Daily  . lactulose  30 g Oral TID  . mouth rinse  15 mL Mouth Rinse q12n4p  . metoprolol succinate  25 mg Oral Daily  . nortriptyline  25 mg Oral QHS  . polyethylene glycol  17 g Oral Daily  . torsemide  80 mg Oral Daily    Infusions:   PRN Medications: acetaminophen, albuterol, docusate sodium, ondansetron (ZOFRAN) IV, senna-docusate    Assessment/Plan   1. CAD: Admitted with chest pain, TnI 0.11=>0.13.  Suspect demand ischemia with mild volume overload.   On 3/5, had cath with DES to severe pLAD stenosis.   - Continue  ASA + Plavix x 1 month along with her Eliquis. Then would stop ASA after 1 month4/10/2017.  - Continue statin.  - No s/s ischemia.  2.Chronic systolicEF 44% s/p BiV ICD - [employer]: NICM originally, now with mixed ischemic cardiomyoapthy component as above.CI 1.9 on cath 3/5. Echo 04/2017 LVEF 20%, Grade 2 DD. She was admitted with volume overload.  - Volume status stable. Continue torsemide 80 mg daily. Takes torsemide 80/40 at home.  - Continue toprol xl 25 mg  daily.  - Hold spiro 25 mg daily with hyperkalemia - no aceI or entresto with allergy. Possible losartan in future.  3. Paroxysmal AF: CHA2DS2-VASc 6.NSR here.  No AF on ICDinterpretation 08/23/2017.  - On Eliquis 5 mg twice a day. - Had a BM with blood on toilet paper yesterday. Hemoglobin stable at 11.9 4. Morbid obesity with h/o CO2 retention- using oxygen at night: Not on chronic O2 during the day. Has not been show to de-saturate with ambulation.No change. 5. R Flank Hematoma 6. VT: x 4 since Feb 4th, each time with successful ATP. - Continue Toprol XL 25 mg daily.  - She had 18 beat run of NSVT overnight.  She did not require ATP.  - K 3.9, Mag 2.1 this am.  7. Urinary retention: Foley now out. Being treated for UTI. ABX broadened to also cover PNA. Resolved.  8. Delirium: UA was borderline for UTI but culture with no growth.  Will treat as UTI with short course of antibiotics.  - Ammonia 50 > 48. On lactulose for goal of 3 BMs/day. - Abdominal US 3/12: 5 mm calcification versus tiny amount of gas in left lobe, small amount of free fluid on inferior aspect of liver.  - GI consulted for elevated ammonia with concern for hepatic encephalopathy.  - CT abd negative. GI feels hepatic encephalopathy is less likely. 9. Deconditioning - Plan for SNF at [place] aware of possible DC today. 10. AKI: baseline is 1.0-1.1. Creatinine peak 1.59 - Creatinine 1.2 11. Constipation:  - Resolved. 14. ID - Was being treated for UTI with Ceftriazone > changed to Levaquin cover PNA. CXR negative for infiltrates. - Afebrile. Abx changed to PO ceftin and doxy yesterday. WBC trending down.   Length of Stay: [place], NP  08/29/2017, 7:14 AM  Advanced Heart Failure Team Pager [phone] (M-F; [place])  Please contact [facility] for night-coverage after hours (4p -7a ) and weekends on [email or web address]  Patient seen and examined with the above-signed Advanced Practice Provider and/or  Housestaff. I personally reviewed laboratory data, imaging studies and relevant notes. I independently examined the patient and formulated the important aspects of the plan. I have edited the note to reflect any of my changes or salient points. I have personally discussed the plan with the patient and/or family.  Remains very weak. Intermittently confused and SOB. Had several episodes of NSVT today. Volume status remains elevated. Very weak appearing on exam. Long talk with her son at bedside about my concern that she may be nearing the end of her life. We discussed the role of Hospice versus SNF and we both feel that it may be close to the point where Hospice is more appropriate. They agree to meeting with Hospice and Palliative Care tomorrow. Consult placed.   Glori Bickers, MD  10:32 PM

## 2017-08-29 NOTE — Clinical Social Work Note (Signed)
Patient's insurance authorization is approved for SNF. She is approved for 7 days: [id number]. This authorization is good for 5 days. [employer] has already notified the SNF admissions coordinator.  Dayton Scrape, [place]

## 2017-08-29 NOTE — Progress Notes (Signed)
  Paged for "Complex" change on Tele.  Noted two episodes of NSVT 7-10 beats. One just before noon and the other ~ 1530.   Given IV lasix earlier this afternoon with acute SOB. CXR relatively unchanged from previous.   Mg 2.1 and K 3.9 this am. Will give 20 meq K and follow.    Legrand Como [address] [place], [place] 08/29/2017 3:49 PM

## 2017-08-30 ENCOUNTER — Telehealth: Payer: Self-pay

## 2017-08-30 DIAGNOSIS — I2510 Atherosclerotic heart disease of native coronary artery without angina pectoris: Secondary | ICD-10-CM | POA: Diagnosis not present

## 2017-08-30 DIAGNOSIS — A419 Sepsis, unspecified organism: Secondary | ICD-10-CM | POA: Diagnosis not present

## 2017-08-30 DIAGNOSIS — N390 Urinary tract infection, site not specified: Secondary | ICD-10-CM | POA: Diagnosis not present

## 2017-08-30 DIAGNOSIS — I5040 Unspecified combined systolic (congestive) and diastolic (congestive) heart failure: Secondary | ICD-10-CM | POA: Diagnosis not present

## 2017-08-30 DIAGNOSIS — E7220 Disorder of urea cycle metabolism, unspecified: Secondary | ICD-10-CM | POA: Diagnosis not present

## 2017-08-30 DIAGNOSIS — R410 Disorientation, unspecified: Secondary | ICD-10-CM | POA: Diagnosis present

## 2017-08-30 DIAGNOSIS — Z8249 Family history of ischemic heart disease and other diseases of the circulatory system: Secondary | ICD-10-CM | POA: Diagnosis not present

## 2017-08-30 DIAGNOSIS — N183 Chronic kidney disease, stage 3 (moderate): Secondary | ICD-10-CM | POA: Diagnosis not present

## 2017-08-30 DIAGNOSIS — R0989 Other specified symptoms and signs involving the circulatory and respiratory systems: Secondary | ICD-10-CM | POA: Diagnosis not present

## 2017-08-30 DIAGNOSIS — I252 Old myocardial infarction: Secondary | ICD-10-CM | POA: Diagnosis not present

## 2017-08-30 DIAGNOSIS — I5032 Chronic diastolic (congestive) heart failure: Secondary | ICD-10-CM | POA: Diagnosis not present

## 2017-08-30 DIAGNOSIS — I5022 Chronic systolic (congestive) heart failure: Secondary | ICD-10-CM

## 2017-08-30 DIAGNOSIS — B373 Candidiasis of vulva and vagina: Secondary | ICD-10-CM | POA: Diagnosis not present

## 2017-08-30 DIAGNOSIS — N179 Acute kidney failure, unspecified: Secondary | ICD-10-CM | POA: Diagnosis not present

## 2017-08-30 DIAGNOSIS — J9621 Acute and chronic respiratory failure with hypoxia: Secondary | ICD-10-CM | POA: Diagnosis not present

## 2017-08-30 DIAGNOSIS — G4733 Obstructive sleep apnea (adult) (pediatric): Secondary | ICD-10-CM | POA: Diagnosis not present

## 2017-08-30 DIAGNOSIS — R443 Hallucinations, unspecified: Secondary | ICD-10-CM | POA: Diagnosis not present

## 2017-08-30 DIAGNOSIS — R4182 Altered mental status, unspecified: Secondary | ICD-10-CM | POA: Diagnosis not present

## 2017-08-30 DIAGNOSIS — R188 Other ascites: Secondary | ICD-10-CM | POA: Diagnosis not present

## 2017-08-30 DIAGNOSIS — G9341 Metabolic encephalopathy: Secondary | ICD-10-CM | POA: Diagnosis not present

## 2017-08-30 DIAGNOSIS — R278 Other lack of coordination: Secondary | ICD-10-CM | POA: Diagnosis not present

## 2017-08-30 DIAGNOSIS — E8770 Fluid overload, unspecified: Secondary | ICD-10-CM | POA: Diagnosis not present

## 2017-08-30 DIAGNOSIS — Z95810 Presence of automatic (implantable) cardiac defibrillator: Secondary | ICD-10-CM | POA: Diagnosis not present

## 2017-08-30 DIAGNOSIS — R2681 Unsteadiness on feet: Secondary | ICD-10-CM | POA: Diagnosis not present

## 2017-08-30 DIAGNOSIS — M6281 Muscle weakness (generalized): Secondary | ICD-10-CM | POA: Diagnosis not present

## 2017-08-30 DIAGNOSIS — Z7982 Long term (current) use of aspirin: Secondary | ICD-10-CM | POA: Diagnosis not present

## 2017-08-30 DIAGNOSIS — Z7189 Other specified counseling: Secondary | ICD-10-CM | POA: Diagnosis not present

## 2017-08-30 DIAGNOSIS — Z85828 Personal history of other malignant neoplasm of skin: Secondary | ICD-10-CM | POA: Diagnosis not present

## 2017-08-30 DIAGNOSIS — I130 Hypertensive heart and chronic kidney disease with heart failure and stage 1 through stage 4 chronic kidney disease, or unspecified chronic kidney disease: Secondary | ICD-10-CM | POA: Diagnosis not present

## 2017-08-30 DIAGNOSIS — Z515 Encounter for palliative care: Secondary | ICD-10-CM

## 2017-08-30 DIAGNOSIS — S61217A Laceration without foreign body of left little finger without damage to nail, initial encounter: Secondary | ICD-10-CM | POA: Diagnosis not present

## 2017-08-30 DIAGNOSIS — I429 Cardiomyopathy, unspecified: Secondary | ICD-10-CM | POA: Diagnosis not present

## 2017-08-30 DIAGNOSIS — K7200 Acute and subacute hepatic failure without coma: Secondary | ICD-10-CM | POA: Diagnosis not present

## 2017-08-30 DIAGNOSIS — Z7901 Long term (current) use of anticoagulants: Secondary | ICD-10-CM | POA: Diagnosis not present

## 2017-08-30 DIAGNOSIS — R0789 Other chest pain: Secondary | ICD-10-CM

## 2017-08-30 DIAGNOSIS — Z9981 Dependence on supplemental oxygen: Secondary | ICD-10-CM | POA: Diagnosis not present

## 2017-08-30 DIAGNOSIS — I480 Paroxysmal atrial fibrillation: Secondary | ICD-10-CM | POA: Diagnosis not present

## 2017-08-30 DIAGNOSIS — I5023 Acute on chronic systolic (congestive) heart failure: Secondary | ICD-10-CM | POA: Diagnosis not present

## 2017-08-30 DIAGNOSIS — Z7902 Long term (current) use of antithrombotics/antiplatelets: Secondary | ICD-10-CM | POA: Diagnosis not present

## 2017-08-30 DIAGNOSIS — I472 Ventricular tachycardia: Secondary | ICD-10-CM | POA: Diagnosis not present

## 2017-08-30 LAB — CBC WITH DIFFERENTIAL/PLATELET
BASOS ABS: 0 10*3/uL (ref 0.0–0.1)
Basophils Relative: 0 %
EOS PCT: 3 %
Eosinophils Absolute: 0.3 10*3/uL (ref 0.0–0.7)
HCT: 39.1 % (ref 36.0–46.0)
Hemoglobin: 12 g/dL (ref 12.0–15.0)
LYMPHS PCT: 22 %
Lymphs Abs: 2 10*3/uL (ref 0.7–4.0)
MCH: 29.4 pg (ref 26.0–34.0)
MCHC: 30.7 g/dL (ref 30.0–36.0)
MCV: 95.8 fL (ref 78.0–100.0)
Monocytes Absolute: 1 10*3/uL (ref 0.1–1.0)
Monocytes Relative: 11 %
NEUTROS ABS: 5.9 10*3/uL (ref 1.7–7.7)
Neutrophils Relative %: 64 %
PLATELETS: 198 10*3/uL (ref 150–400)
RBC: 4.08 MIL/uL (ref 3.87–5.11)
RDW: 15 % (ref 11.5–15.5)
WBC: 9.2 10*3/uL (ref 4.0–10.5)

## 2017-08-30 LAB — HEPATITIS PANEL, ACUTE
HCV Ab: <0.1 s/co ratio (ref 0.0–0.9)
Hep A IgM: NEGATIVE
Hep B C IgM: NEGATIVE
Hepatitis B Surface Ag: NEGATIVE

## 2017-08-30 LAB — BASIC METABOLIC PANEL
ANION GAP: 11 (ref 5–15)
BUN: 32 mg/dL — ABNORMAL HIGH (ref 6–20)
CO2: 28 mmol/L (ref 22–32)
Calcium: 8.8 mg/dL — ABNORMAL LOW (ref 8.9–10.3)
Chloride: 94 mmol/L — ABNORMAL LOW (ref 101–111)
Creatinine, Ser: 1.12 mg/dL — ABNORMAL HIGH (ref 0.44–1.00)
GFR calc non Af Amer: 44 mL/min — ABNORMAL LOW (ref >60)
GFR, EST AFRICAN AMERICAN: 52 mL/min — AB (ref >60)
GLUCOSE: 117 mg/dL — AB (ref 65–99)
POTASSIUM: 3.5 mmol/L (ref 3.5–5.1)
Sodium: 133 mmol/L — ABNORMAL LOW (ref 135–145)

## 2017-08-30 LAB — MAGNESIUM: MAGNESIUM: 1.9 mg/dL (ref 1.7–2.4)

## 2017-08-30 LAB — AMMONIA: AMMONIA: 21 umol/L (ref 9–35)

## 2017-08-30 MED ORDER — POTASSIUM CHLORIDE CRYS ER 20 MEQ PO TBCR: 20.0000 meq | EXTENDED_RELEASE_TABLET | Freq: Two times a day (BID) | ORAL | 6 refills | Status: DC

## 2017-08-30 MED ORDER — POTASSIUM CHLORIDE CRYS ER 20 MEQ PO TBCR
20.0000 meq | EXTENDED_RELEASE_TABLET | Freq: Two times a day (BID) | ORAL | Status: DC
  Administered 2017-08-30: 20 meq via ORAL
  Filled 2017-08-30: qty 1

## 2017-08-30 MED ORDER — TORSEMIDE 20 MG PO TABS
40.0000 mg | ORAL_TABLET | Freq: Two times a day (BID) | ORAL | Status: DC
  Administered 2017-08-30 (×2): 40 mg via ORAL
  Filled 2017-08-30 (×3): qty 2

## 2017-08-30 MED ORDER — PANTOPRAZOLE SODIUM 40 MG PO TBEC: 40.0000 mg | DELAYED_RELEASE_TABLET | Freq: Every day | ORAL | 0 refills | Status: DC

## 2017-08-30 MED ORDER — METOLAZONE 2.5 MG PO TABS: ORAL_TABLET | ORAL | 0 refills | Status: DC

## 2017-08-30 MED ORDER — DOXYCYCLINE HYCLATE 100 MG PO TABS: 100.0000 mg | ORAL_TABLET | Freq: Two times a day (BID) | ORAL | 0 refills | Status: AC

## 2017-08-30 MED ORDER — POTASSIUM CHLORIDE CRYS ER 20 MEQ PO TBCR: 20.0000 meq | EXTENDED_RELEASE_TABLET | Freq: Once | ORAL | Status: DC

## 2017-08-30 MED ORDER — CEFUROXIME AXETIL 500 MG PO TABS: 500.0000 mg | ORAL_TABLET | Freq: Two times a day (BID) | ORAL | Status: AC

## 2017-08-30 MED ORDER — TORSEMIDE 20 MG PO TABS: 80.0000 mg | ORAL_TABLET | Freq: Every day | ORAL | Status: DC

## 2017-08-30 MED ORDER — TORSEMIDE 20 MG PO TABS: 40.0000 mg | ORAL_TABLET | Freq: Every day | ORAL | Status: DC

## 2017-08-30 MED ORDER — TORSEMIDE 20 MG PO TABS: 40.0000 mg | ORAL_TABLET | Freq: Two times a day (BID) | ORAL | 6 refills | Status: DC

## 2017-08-30 MED ORDER — SPIRONOLACTONE 12.5 MG HALF TABLET
12.5000 mg | ORAL_TABLET | Freq: Every day | ORAL | Status: DC
  Administered 2017-08-30: 12.5 mg via ORAL
  Filled 2017-08-30: qty 1

## 2017-08-30 NOTE — Progress Notes (Signed)
Pt waiting on d/c paper iv out with family copies given ptar waiting for medical ness. Papers.

## 2017-08-30 NOTE — Progress Notes (Addendum)
Patient ID: Bianca [name], female   DOB: 05-27-1933, 84 y.o.   MRN: [id number]     Advanced Heart Failure Rounding Note  PCP-Cardiologist: Candee Furbish, MD   Subjective:    Received 80 mg IV lasix x2 yesterday for worsening SOB and volume overload. CXR unchanged. Several runs of NSVT. Palliative care to see today.   SOB is much better. No CP. Very sleepy this morning.   Objective:   Weight Range: 219 lb 2.2 oz (99.4 kg) Body mass index is 35.37 kg/m.   Vital Signs:   Temp:  [97.8 F (36.6 C)-98.7 F (37.1 C)] 98 F (36.7 C) (03/15 0438) Pulse Rate:  [75-85] 82 (03/15 0438) Resp:  [16-18] 18 (03/15 0438) BP: (99-109)/(59-76) 109/72 (03/15 0438) SpO2:  [91 %-98 %] 98 % (03/15 0438) Weight:  [219 lb 2.2 oz (99.4 kg)] 219 lb 2.2 oz (99.4 kg) (03/15 0438) Last BM Date: 08/29/17  Weight change: Filed Weights   08/28/17 0551 08/29/17 0508 08/30/17 0438  Weight: 204 lb 11.2 oz (92.9 kg) 218 lb 4.8 oz (99 kg) 219 lb 2.2 oz (99.4 kg)    Intake/Output:   Intake/Output Summary (Last 24 hours) at 08/30/2017 6767 Last data filed at 08/30/2017 0600 Gross per 24 hour  Intake 600 ml  Output 400 ml  Net 200 ml      Physical Exam  General: Elderly. Lying in bed HEENT: Normal Neck: Supple. JVP 8-10. Carotids 2+ bilat; no bruits. No thyromegaly or nodule noted. Cor: PMI nondisplaced. RRR, No M/G/R noted Lungs: wheezes throughout. No crackles Abdomen: Soft, non-tender, non-distended, no HSM. No bruits or masses. +BS  Extremities: No cyanosis, clubbing, or rash. R and LLE trace edema.  Neuro: Alert & orientedx3, cranial nerves grossly intact. moves all 4 extremities w/o difficulty. Affect pleasant   Telemetry   Vpaced 80-90's. No NVST overnight. Personally reviewed.   Labs    CBC Recent Labs    08/29/17 0551 08/29/17 1517 08/30/17 0542  WBC 11.0*  --  9.2  NEUTROABS 7.7  --  5.9  HGB 11.9* 12.0 12.0  HCT 37.4 37.9 39.1  MCV 94.7  --  95.8  PLT 213  --  209    Basic Metabolic Panel Recent Labs    08/28/17 0459 08/29/17 0726 08/30/17 0542  NA 129* 131* 133*  K 4.9 3.9 3.5  CL 94* 96* 94*  CO2 24 23 28   GLUCOSE 106* 119* 117*  BUN 36* 34* 32*  CREATININE 1.22* 1.20* 1.12*  CALCIUM 9.1 8.8* 8.8*  MG 2.2 2.1  --    Liver Function Tests Recent Labs    08/29/17 0551  AST 53*  ALT 51  ALKPHOS 76  BILITOT 1.3*  PROT 7.0  ALBUMIN 3.7   No results for input(s): LIPASE, AMYLASE in the last 72 hours. Cardiac Enzymes No results for input(s): CKTOTAL, CKMB, CKMBINDEX, TROPONINI in the last 72 hours.  BNP: BNP (last 3 results) Recent Labs    01/01/17 1339 07/30/17 0842 08/22/17 2115  BNP 975.5* 1,664.3* 962.0*    ProBNP (last 3 results) No results for input(s): PROBNP in the last 8760 hours.   D-Dimer No results for input(s): DDIMER in the last 72 hours. Hemoglobin A1C No results for input(s): HGBA1C in the last 72 hours. Fasting Lipid Panel No results for input(s): CHOL, HDL, LDLCALC, TRIG, CHOLHDL, LDLDIRECT in the last 72 hours. Thyroid Function Tests No results for input(s): TSH, T4TOTAL, T3FREE, THYROIDAB in the last 72 hours.  Invalid input(s):  FREET3  Other results:   Imaging    Dg Chest Port 1 View  Result Date: 08/29/2017 CLINICAL DATA:  Shortness of breath. EXAM: PORTABLE CHEST 1 VIEW COMPARISON:  Chest x-ray dated August 27, 2017. FINDINGS: Unchanged left chest wall pacemaker. Stable moderate cardiomegaly. Normal pulmonary vascularity. No focal consolidation, pleural effusion, or pneumothorax. No acute osseous abnormality. IMPRESSION: Stable cardiomegaly.  No active disease. Electronically Signed   By: Titus Dubin M.D.   On: 08/29/2017 15:01     Medications:     Scheduled Medications: . allopurinol  400 mg Oral Daily  . apixaban  5 mg Oral BID  . aspirin  81 mg Oral Daily  . atorvastatin  40 mg Oral Daily  . cefUROXime  500 mg Oral BID WC  . chlorhexidine  15 mL Mouth Rinse BID  .  cholecalciferol  1,000 Units Oral Daily  . clopidogrel  75 mg Oral Q breakfast  . colestipol  2 g Oral QHS  . doxycycline  100 mg Oral Q12H  . gabapentin  400 mg Oral TID  . isosorbide mononitrate  15 mg Oral Daily  . lactulose  30 g Oral TID  . mouth rinse  15 mL Mouth Rinse q12n4p  . metoprolol succinate  25 mg Oral Daily  . nortriptyline  25 mg Oral QHS  . polyethylene glycol  17 g Oral Daily    Infusions:   PRN Medications: acetaminophen, albuterol, docusate sodium, ondansetron (ZOFRAN) IV, senna-docusate    Assessment/Plan   1. CAD: Admitted with chest pain, TnI 0.11=>0.13.  Suspect demand ischemia with mild volume overload.   On 3/5, had cath with DES to severe pLAD stenosis.   - Continue  ASA + Plavix x 1 month along with her Eliquis. Then would stop ASA after 1 month4/10/2017.  - Continue statin.  - No s/s ischemia 2.Chronic systolicEF 16% s/p BiV ICD - Medtronic: NICM originally, now with mixed ischemic cardiomyoapthy component as above.CI 1.9 on cath 3/5. Echo 04/2017 LVEF 20%, Grade 2 DD. She was admitted with volume overload.  - Volume status stable. Start PO torsemide 40 mg BID with 20 meq K BID. For DC, she should take an extra 40 mg torsemide for weight >220 lbs. Discussed with Dr. Haroldine Laws.  - Continue toprol xl 25 mg daily.  - Restart spiro 12.5 mg daily. K 3.5 this am. - no aceI or entresto with allergy. Possible losartan in future.  - Palliative care consult to discuss goals of care and possible hospice today. Dr. Haroldine Laws discussed with family and they agree to speak with them. 3. Paroxysmal AF: CHA2DS2-VASc 6.NSR here.  No AF on ICDinterpretation 08/23/2017.  - On Eliquis 5 mg twice a day. - No s/s bleeding.  4. Morbid obesity with h/o CO2 retention- using oxygen at night: On CPAP HS. No change.  5. R Flank Hematoma 6. VT: x 4 since Feb 4th, each time with successful ATP. - Continue Toprol XL 25 mg daily.  - Several runs of NSVT, none requiring  ATP or shock - K 3.5, mag pending. Will supp K  7. Urinary retention: Foley now out. Being treated for UTI. ABX broadened to also cover PNA. Now on PO abx 8. Delirium: UA was borderline for UTI but culture with no growth.  Now on PO abx.  - Ammonia has normalized. GI consulted - she does not have liver cirrhosis.  - Abdominal US 3/12: 5 mm calcification versus tiny amount of gas in left lobe, small amount of  free fluid on inferior aspect of liver.  - CT abd negative. GI feels hepatic encephalopathy is unlikely - DC lactulose.  9. Deconditioning - Plan for SNF vs hospice today pending palliative consult.  10. AKI: baseline is 1.0-1.1. Creatinine peak 1.59 - Creatinine 1.1 this am 11. Constipation:  - Resolved. 59. ID - Was being treated for UTI with Ceftriazone > changed to Levaquin cover PNA. CXR negative for infiltrates. Now on PO ceftin and doxy. Per PharmD, 5 more doses of each. Adjusted in epic.  - Afebrile, WBC normal  Dispo: Palliative to see for goals of care discussion Plan for SNF vs hospice today. CSW made aware. Family preference of SNF is [facility] follow up has been scheduled   Length of Stay: [place], NP  08/30/2017, 7:12 AM  Advanced Heart Failure Team Pager [phone] (M-F; 7a - 4p)  Please contact [facility] for night-coverage after hours (4p -7a ) and weekends on [email or web address]  Patient seen and examined with the above-signed Advanced Practice Provider and/or Housestaff. I personally reviewed laboratory data, imaging studies and relevant notes. I independently examined the patient and formulated the important aspects of the plan. I have edited the note to reflect any of my changes or salient points. I have personally discussed the plan with the patient and/or family.  She is breathing better and volume status improved but remains very weak and intermittently confused. Has been seen by Dr. Domingo Cocking in [place] with very thoughtful  assessment. Patient would like to go to SNF and see if she is able to rehab. If rehab potential is poor can transition to Hospice. She is ready for d/c to SNF when bed available. D/c meds as above. Will need to watch volume status closely. Sliding scale diuretic regimen provided.   Glori Bickers, MD  4:44 PM

## 2017-08-30 NOTE — Telephone Encounter (Signed)
Returned daughter in laws call as requested by voice mail message.  She has spoken with someone at the hospital that answered her question about taking the device monitor to rehab. She was told to take so remote transmissions can continue. She was discharged today and going to rehab with palliative care.  Advised next ICM transmission is 09/10/2017.

## 2017-08-30 NOTE — Progress Notes (Signed)
Patient's family request transmission check of pacemaker prior to leaving.  Called doctor- will get pacer maker check.

## 2017-08-30 NOTE — Consult Note (Signed)
Palliative Care Consult  I met today with Bianca [name] and her son.   I introduced palliative care as specialized medical care for people living with serious illness. It focuses on providing relief from the symptoms and stress of a serious illness. The goal is to improve quality of life for both the patient and the family.  We discussed clinical course as well as wishes moving forward in regard to advanced directives.  Values and goals of care important to patient and family were attempted to be elicited.  Concepts specific to code status and rehospitalization discussed.  We discussed difference between a aggressive medical intervention path and a palliative, comfort focused care path.    Concept of Hospice and Palliative Care were discussed.  Son reports that they are familiar with hospice services from involvement with other family members.  We reviewed a MOST form and discussed how to develop plan of care to focus on continuing therapies that would maximize chance of being well enough to return home and limiting therapies not in line with this goal.  Family appreciative of discussion regarding code status and MOST form but need time to discuss.  I provided copy of MOST as well as Hard Choices for [place] for them to review.  We also discussed that the hospital can be useful as long as she is getting well enough from care she receives at the hospital to enjoy time at home, but we are approaching point where, if her goal is to be at home, she may be better served to plan on being at home and bringing care to her at home rather repeated trips to the hospital. We discussed hospice as a tool that may be beneficial in this goal as she reaches a point where we are trying to fix problems that are not fixable.  She is in agreement that a good plan would be to transition to rehab as they have previously been arranging. She has done well with rehabbing in the past. If she does well and continues to  thrive, I encouraged they continue with this plan. If, however,  she is unable to regain function and he continues to decline, I recommended that she be followed by palliative care at SNF to help determine if she may be better served by focusing care on staying out of the hospital with support of organization such as hospice.  Family very appreciative of time and discussion and report just needing more time to discuss as family before making any decisions.  Questions and concerns addressed.    Total time: 70 minutes Greater than 50%  of this time was spent counseling and coordinating care related to the above assessment and plan.  Micheline Rough, MD [place] Team [phone]

## 2017-08-30 NOTE — Progress Notes (Signed)
Called report to [place] to [place].  Discussed discharge instruction.  Sent discharge instruction home with family and a copy with [facility]

## 2017-08-30 NOTE — Progress Notes (Signed)
Palliative Medicine consult noted. Due to high referral volume, there may be a delay seeing this patient. Please call the Palliative Medicine Team office at [phone] if recommendations are needed in the interim.  Thank you for inviting [place] to see this patient.  Marjie Skiff Belma Dyches, RN, BSN, [facility] Palliative Medicine Team 08/30/2017 7:54 AM Office [phone]

## 2017-08-30 NOTE — Progress Notes (Addendum)
Clinical Social Worker facilitated patient discharge including contacting patient family and facility to confirm patient discharge plans.  Clinical information faxed to facility and family agreeable with plan.  RN to arranged ambulance transport via [place] to [facility]. CSW gave RN instructions on how to contact [facility] since RN was unsure of when patient will be ready for discharge .  RN to call [phone] and ask for RN in front hall (pt will go in room 30) for report prior to discharge.  Clinical Social Worker will sign off for now as social work intervention is no longer needed. Please consult [place] again if new need arises.  Rhea Pink, MSW, [place]

## 2017-08-30 NOTE — Discharge Summary (Addendum)
Advanced Heart Failure Discharge Note  Discharge Summary   Patient ID: Bianca [name] MRN: [id number], DOB/AGE: 08/08/1932 84 y.o. Admit date: 08/22/2017 D/C date:     08/30/2017   Primary Discharge Diagnoses:   1. Acute on Chronic systolic HF - Medtronic BiV ICD 2. CAD with recent LAD stent 3/19 - On plavix, ASA. Can stop ASA 09/20/2017 3. PAF - On Eliquis 4. Morbid obesity 5. Right flank hematoma 6. VT 7. Urinary retention 8. Acute Delirium 9. Deconditioning 10. AKI 11. Constipation  12. ID  Hospital Course: Bianca [name] is a 84 y.o. female with a history of PAF on eliquis, CKD III, chronic systolic HF due to NICM (EF 20%) s/p Bi-V ICD, morbid obesity on CPAP with 2 L O2 at night, and CAD. She was admitted on 3/5 for LHC after discovery of VT treated with ATP on interrogation of ICD at a clinic visit. She received DES to mid LAD and was discharged on 3/6.  She presented on 3/8 with palpitations and chest tightness. CP was thought to be due to demand ischemia with mild volume overload. She was diuresed with IV lasix and transitioned to 40 mg PO torsemide BID. HF medications were optimized. No ACE-I/ARNI with allergy. No ARB started with transient AKI and softer BPs.  Her course was complicated by delirium, which was initially thought to be caused by a UTI. She was treated with antibiotics, which were broadened to also cover possible pneumonia. She will complete PO abx outpatient. GI consulted for ammonia of 50. CT abdomen showed no liver cirrhosis, so hepatic encephalopathy less likely to be contributing. Ammonia normalized on day of discharge.    She remained in NSR throughout admission. Eliquis was continued with no signs of bleeding. She is also on plavix and aspirin with recent stent. She had several short runs of NSVT during hospitalization. None required ATP or shock. Electrolytes were monitored closely.  She has become very deconditioned. PT/OT recommending SNF for rehab.  With her frequent admissions, Dr Haroldine Laws spoke with family about goals of care and possible hospice at DC. Palliative MD saw and discussed options with family. For now, they would like to go to SNF for rehab with palliative to follow. They are aware that they can transition to hospice when they are ready.  Patient will be followed closely in the HF clinic, see below. She is being discharged to [facility] with palliative to follow.  Discharge Weight Range: 219 lbs Discharge Vitals: Blood pressure (!) 107/51, pulse 87, temperature 98.6 F (37 C), temperature source Oral, resp. rate 20, height 5\' 6"  (1.676 m), weight 219 lb 2.2 oz (99.4 kg), SpO2 98 %.  Labs: Lab Results  Component Value Date   WBC 9.2 08/30/2017   HGB 12.0 08/30/2017   HCT 39.1 08/30/2017   MCV 95.8 08/30/2017   PLT 198 08/30/2017    Recent Labs  Lab 08/29/17 0551  08/30/17 0542  NA  --    < > 133*  K  --    < > 3.5  CL  --    < > 94*  CO2  --    < > 28  BUN  --    < > 32*  CREATININE  --    < > 1.12*  CALCIUM  --    < > 8.8*  PROT 7.0  --   --   BILITOT 1.3*  --   --   ALKPHOS 76  --   --  ALT 51  --   --   AST 53*  --   --   GLUCOSE  --    < > 117*   < > = values in this interval not displayed.   Lab Results  Component Value Date   CHOL 158 07/20/2014   HDL 38.60 (L) 07/20/2014   LDLCALC 73 09/30/2013   TRIG 332.0 (H) 07/20/2014   BNP (last 3 results) Recent Labs    01/01/17 1339 07/30/17 0842 08/22/17 2115  BNP 975.5* 1,664.3* 962.0*    ProBNP (last 3 results) No results for input(s): PROBNP in the last 8760 hours.   Diagnostic Studies/Procedures    Discharge Medications   Allergies as of 08/30/2017      Reactions   Quinapril Hcl Swelling   Tongue and throat   Tape Other (See Comments)   Plastic tape causes irritation.    Coreg [carvedilol]    Very sleepy    Tessalon [benzonatate] Swelling, Other (See Comments)   Capsule opened in mouth, not an allergy   Neosporin  [neomycin-polymyxin-gramicidin] Rash      Medication List    TAKE these medications   acetaminophen 650 MG CR tablet Commonly known as:  TYLENOL Take 1,300 mg by mouth 2 (two) times daily before a meal.   allopurinol 300 MG tablet Commonly known as:  ZYLOPRIM Take 300 mg by mouth See admin instructions. Take with the 100mg  tablet to equal 400mg  daily   allopurinol 100 MG tablet Commonly known as:  ZYLOPRIM Take 100 mg by mouth See admin instructions. Takes along with a 300 mg tablet to equal 400 mg   aspirin 81 MG chewable tablet Chew 1 tablet (81 mg total) by mouth daily.   atorvastatin 40 MG tablet Commonly known as:  LIPITOR Take 1 tablet (40 mg total) by mouth daily.   CALCIUM/VITAMIN D/MINERALS 600-200 MG-UNIT Tabs Take 1 tablet by mouth 2 (two) times daily.   cefUROXime 500 MG tablet Commonly known as:  CEFTIN Take 1 tablet (500 mg total) by mouth 2 (two) times daily with a meal for 4 doses.   clopidogrel 75 MG tablet Commonly known as:  PLAVIX Take 1 tablet (75 mg total) by mouth daily with breakfast.   colestipol 1 g tablet Commonly known as:  COLESTID Take 2 g by mouth at bedtime.   doxycycline 100 MG tablet Commonly known as:  VIBRA-TABS Take 1 tablet (100 mg total) by mouth every 12 (twelve) hours for 4 doses.   ELIQUIS 5 MG Tabs tablet Generic drug:  apixaban Take 5 mg by mouth 2 (two) times daily.   gabapentin 400 MG capsule Commonly known as:  NEURONTIN Take 1 capsule (400 mg total) by mouth 3 (three) times daily.   guaiFENesin 600 MG 12 hr tablet Commonly known as:  MUCINEX Take 1 tablet (600 mg total) by mouth 2 (two) times daily.   isosorbide mononitrate 30 MG 24 hr tablet Commonly known as:  IMDUR Take 0.5 tablets (15 mg total) by mouth daily.   metolazone 2.5 MG tablet Commonly known as:  ZAROXOLYN Take 2.5 mg PRN for weight > 225 lbs. What changed:  additional instructions   metoprolol succinate 25 MG 24 hr tablet Commonly known  as:  TOPROL-XL Take 1 tablet (25 mg total) by mouth daily.   nitroGLYCERIN 0.4 MG SL tablet Commonly known as:  NITROSTAT Place 1 tablet (0.4 mg total) under the tongue every 5 (five) minutes as needed for chest pain.   nortriptyline 25  MG capsule Commonly known as:  PAMELOR Take 25 mg by mouth at bedtime.   OXYGEN Inhale 2 L/min into the lungs at bedtime as needed (uses as bedtime everynight and then occasionally iun the daytime as needed for SOB).   pantoprazole 40 MG tablet Commonly known as:  PROTONIX Take 1 tablet (40 mg total) by mouth daily.   potassium chloride SA 20 MEQ tablet Commonly known as:  K-DUR,KLOR-CON Take 1 tablet (20 mEq total) by mouth 2 (two) times daily. What changed:  See the new instructions.   PROBIOTIC PO Take 1 tablet by mouth daily.   senna-docusate 8.6-50 MG tablet Commonly known as:  Senokot-S Take 1 tablet by mouth 2 (two) times daily as needed for mild constipation.   spironolactone 25 MG tablet Commonly known as:  ALDACTONE Take 1 tablet (25 mg total) by mouth daily.   torsemide 20 MG tablet Commonly known as:  DEMADEX Take 2 tablets (40 mg total) by mouth 2 (two) times daily. Take an extra 40 mg for weight > 220 lbs. What changed:    how much to take  how to take this  when to take this  additional instructions   Vitamin D3 1000 units Caps Take 1,000 Units by mouth daily.       Disposition   The patient will be discharged in stable condition to SNF. Discharge Instructions    (HEART FAILURE PATIENTS) Call MD:  Anytime you have any of the following symptoms: 1) 3 pound weight gain in 24 hours or 5 pounds in 1 week 2) shortness of breath, with or without a dry hacking cough 3) swelling in the hands, feet or stomach 4) if you have to sleep on extra pillows at night in order to breathe.   Complete by:  As directed    Diet - low sodium heart healthy   Complete by:  As directed    Increase activity slowly   Complete by:  As  directed       Contact information for follow-up providers    Home, [facility] Follow up.   Specialty:  [facility] Why:  They will do your home health care at your home Contact information: [place] [address] [phone]        [facility]. Go on 09/09/2017.   Specialty:  Cardiology Why:  at 11:00 am. Garage Code [id number]  Contact information: [address] [id number] [place] [phone]           Contact information for after-discharge care    Destination    [facility] Follow up.   Service:  Skilled Nursing Contact information: [address] [place] [address] [place] [phone]                    Duration of Discharge Encounter: Greater than 35 minutes   Signed, Georgiana Shore, NP 08/30/2017, 1:52 PM   Patient seen and examined with the above-signed Advanced Practice Provider and/or Housestaff. I personally reviewed laboratory data, imaging studies and relevant notes. I independently examined the patient and formulated the important aspects of the plan. I have edited the note to reflect any of my changes or salient points. I have personally discussed the plan with the patient and/or family.  She is breathing better and volume status improved but remains very weak and intermittently confused. Has been seen by Dr. Domingo Cocking in  Palliative Care with very thoughtful assessment. Patient would like to go to SNF and see if she is able to rehab. If rehab potential is poor can transition to Hospice. She is ready for d/c to SNF when bed available. D/c meds as above. Will need to watch volume status closely. Sliding scale diuretic regimen provided.   Glori Bickers, MD  4:44 PM

## 2017-09-02 ENCOUNTER — Other Ambulatory Visit: Payer: Self-pay

## 2017-09-02 NOTE — Patient Outreach (Signed)
[facility]) Care Management  09/02/2017  MEDRITH VEILLON February 22, 1933 [id number]   Care Coordination. Client noted to have discharged to skilled facility for rehabiliation. RNCM will send order to social work for assistance with transition of care when ready for transition to community.  RNCM will no longer be involved in case at this time.  Plan: referral to [facility] social worker, update primary care.  Thea Silversmith, RN, MSN, [place] Coordinator Cell: [phone]

## 2017-09-03 ENCOUNTER — Encounter (HOSPITAL_COMMUNITY): Payer: PPO

## 2017-09-03 DIAGNOSIS — E8770 Fluid overload, unspecified: Secondary | ICD-10-CM | POA: Diagnosis not present

## 2017-09-03 DIAGNOSIS — I480 Paroxysmal atrial fibrillation: Secondary | ICD-10-CM | POA: Diagnosis not present

## 2017-09-03 DIAGNOSIS — I5022 Chronic systolic (congestive) heart failure: Secondary | ICD-10-CM | POA: Diagnosis not present

## 2017-09-03 DIAGNOSIS — Z95810 Presence of automatic (implantable) cardiac defibrillator: Secondary | ICD-10-CM | POA: Diagnosis not present

## 2017-09-04 DIAGNOSIS — I480 Paroxysmal atrial fibrillation: Secondary | ICD-10-CM | POA: Diagnosis not present

## 2017-09-04 DIAGNOSIS — I5022 Chronic systolic (congestive) heart failure: Secondary | ICD-10-CM | POA: Diagnosis not present

## 2017-09-04 DIAGNOSIS — I2510 Atherosclerotic heart disease of native coronary artery without angina pectoris: Secondary | ICD-10-CM | POA: Diagnosis not present

## 2017-09-06 ENCOUNTER — Telehealth: Payer: Self-pay

## 2017-09-06 NOTE — Telephone Encounter (Signed)
Spoke with patients daughter in law who informed me that [facility] agreed to give patient Metolazone today. Dr.Kaplan at the rehab facility has been adjusting patients medications and her hospital discharge instructions said to only administer metoalzone if pts weight was over 225lbs. Her weight today was 224lbs a total of 7lbs up in 1 week. [name] (daughter in law) asked me to call [facility] and confirm that they should notify her doctor if her weight is up 3lbs overnight and 5lbs in 1 week. I called [facility] and spoke with Joelene Millin and confirmed that they would notify if pts weight was up. She told me they gave patient metolazone today as ordered by the doctor at [facility]. I asked her to call us Monday and update me on patients weight and status. She is aware and agreeable with plan. Darrick Grinder, NP made aware she is also agreeable with plan.

## 2017-09-06 NOTE — Telephone Encounter (Signed)
Returned call to daughter in law Rhian Asebedo as requested by voice mail message. She reported patient started retaining fluid on Saturday on 08/31/2017 following hospital discharge on 08/30/2017.  She reported discharge instructions had a lower dosage of Torsemide than patient had been taking prior to hospitalization. The lower dosage was ordered due to decrease in kidney function.  She is concerned that patient will need to be readmitted to hospital due to fluid symptoms of 7 lbs weight gain since 09/03/2017 and 4 of those pounds were last night and ankles are very swollen. Staff physician increased Torsemide dosage 09/02/2017 to the dosage that she was taking prior to hospitalization but symptoms persist.    Staff physician at [facility] rehab center is ordering Metolazone for today to help with symptoms.  Daughter in law would like for me to update the HF clinic on the status of patients fluid accumulation.   Note sent to HF clinic for FYI.   Optivol reading from today:   Impedance decreased starting 09/01/2017 which correlates with the start of symptoms but showing slight improvement today.

## 2017-09-07 ENCOUNTER — Telehealth: Payer: Self-pay | Admitting: Cardiology

## 2017-09-07 NOTE — Telephone Encounter (Signed)
SNF called- wgt down to 217.6 after prn Zaroxolyn yesterday. Pt asymptomatic. No change in Rx.  Kerin Ransom PA-C 09/07/2017 11:13 AM

## 2017-09-09 ENCOUNTER — Encounter (HOSPITAL_COMMUNITY): Payer: PPO

## 2017-09-10 ENCOUNTER — Ambulatory Visit (INDEPENDENT_AMBULATORY_CARE_PROVIDER_SITE_OTHER): Payer: Self-pay

## 2017-09-10 ENCOUNTER — Other Ambulatory Visit: Payer: Self-pay | Admitting: *Deleted

## 2017-09-10 ENCOUNTER — Telehealth: Payer: Self-pay

## 2017-09-10 DIAGNOSIS — Z95810 Presence of automatic (implantable) cardiac defibrillator: Secondary | ICD-10-CM

## 2017-09-10 DIAGNOSIS — I5022 Chronic systolic (congestive) heart failure: Secondary | ICD-10-CM

## 2017-09-10 NOTE — Patient Outreach (Signed)
[facility]) Care Management  09/10/2017  Bianca [name] [birth year] [id number]   CSW had received referral from [place], Thea Silversmith that 3/18 that patient had admitted to [facility] on 3/15 after hospitalization at [facility] from 3/7 - 08/30/17 for Heart Failure & UTI. Per chart review, patient had Palliative Consult with Dr. Micheline Rough on 08/22/17 - patient and family opted for SNF for rehab and they are aware that they can transition to hospice when they are ready. Patient reports that she has been doing well with therapy at [place], ambulating with her rollator and doing exercises with her legs and arms. Patient anticipates discharge back to [place] soon as originally, plan was for 2 weeks of rehab, which would be this Thursday. CSW verified that [facility] consent was signed 08/01/17 with [facility] hospital liaison, Natividad Brood. Patient states that at [facility] - they provide 3 meals a day and transportation to her medical appointments, though her daughter-in-law, [name] typically takes her to her appointments.   CSW spoke with [place] worker, Judson Roch to confirm plans to return to [place] at discharge, though date is uncertain at this time. Patient's 20th day at SNF is Thursday, April 4th. CSW will follow-up in 1 week.    Raynaldo Opitz, LCSW [facility]  Clinical Social Worker cell #: [phone]

## 2017-09-10 NOTE — Progress Notes (Signed)
EPIC Encounter for ICM Monitoring  Patient Name: Bianca [name] is a 84 y.o. female Date: 09/10/2017 Primary Care Physican: Maurice Small, MD Primary Cardiologist:Skains/Laura Dorene Ar, NP Heart Failure Clinic: [place] Electrophysiologist: Lovena Le Dry Weight:214lbs  Bi-V Pacing: 93.1%               Call back from daughter in law, Pragya Lofaso.   Patient currently patient at [facility] center.  Impedance has returned to normal after rehab center adjusted diuretics following a        7 lb weight gain after transitioning from hospital to rehab center, between 08/31/2017 through 09/03/2017 along with swollen ankles.       Thoracic impedance returned to normal after rehab physician started Metolazone for fluid symptoms. After Metolazone weight dropped to 207 lbs from 224 lbs.   Prescribed dosage: Torsemide 20 mg 4 tablets (80 mg total) every morning and 2 tablets (40 mg total) six hours after morning dosage daily. Potassium 20 mEq 2 tablets twice a day. Metolazone 2.5 mg 1 tablet weekly on Monday.  Labs: 08/30/2017 Creatinine 1.12, BUN 32, Potassium 3.5, Sodium 133, EGFR 44-52 08/29/2017 Creatinine 1.20, BUN 34, Potassium 3.9, Sodium 131, EGFR 41-47  08/28/2017 Creatinine 1.22, BUN 36, Potassium 4.9, Sodium 139, EGFR 40-46  08/27/2017 Creatinine 1.51, BUN 42, Potassium 5.0, Sodium 130, EGFR 31-36  08/26/2017 Creatinine 1.59, BUN 37, Potassium 5.6, Sodium 130, EGFR 29-34  08/25/2017 Creatinine 1.34, BUN 33, Potassium 5.1, Sodium 134, EGFR 36-42  08/24/2017 Creatinine 1.01, BUN 29, Potassium 3.6, Sodium 139, EGFR 50-58  08/23/2017 Creatinine 1.07, BUN 29, Potassium 2.7, Sodium 141, EGFR 47-54  08/22/2017 Creatinine 1.13, BUN 33, Potassium 3.3, Sodium 137, EGFR 44-51  08/21/2017 Creatinine 1.02, BUN 30, Potassium 2.9, Sodium 137, EGFR 50-58  08/20/2017 Creatinine 1.10, BUN 30, Potassium 3.2, Sodium 137, EGFR 45-53  08/19/2017 Creatinine 1.28, BUN 27, Potassium 2.8, Sodium  140, EGFR 38-44  08/12/2017 Creatinine 0.97, BUN 21, Potassium 3.4, Sodium 142, EGFR 55-63  08/02/2017 Creatinine 1.13, BUN 21, Potassium 3.4, Sodium 140, EGFR 44-51 08/01/2017 Creatinine 1.16, BUN 24, Potassium 3.4, Sodium 140, EGFR 43-49  07/31/2017 Creatinine 1.03, BUN 23, Potassium 3.1, Sodium 140, EGFR 49-57  07/30/2017 Creatinine 1.16, BUN 24, Potassium 2.5, Sodium 140, EGFR 43-49  07/28/2017 Creatinine 1.10, BUN 25, Potassium 4.0, Sodium 135, EGFR 45-53  07/09/2017 Creatinine 1.08, BUN 23, Potassium 3.3, Sodium 140, EGFR 46-54        A complete set of results can be found in Results Review.  Recommendations: No changes.    Follow-up plan: ICM clinic phone appointment on 09/30/2017 to recheck fluid levels.  Office appointment scheduled 09/13/2017 with Dr. Lovena Le and 09/16/2017 with HF Clinic.  Copy of ICM check sent to Dr. Lovena Le and Dr. Haroldine Laws.   3 month ICM trend: 09/10/2017    1 Year ICM trend:       Rosalene Billings, RN 09/10/2017 9:27 AM

## 2017-09-10 NOTE — Telephone Encounter (Signed)
Remote ICM transmission received.  Attempted call to daughter in law [name] Monterosso and left message per DPR to return call regarding transmission.

## 2017-09-12 DIAGNOSIS — I480 Paroxysmal atrial fibrillation: Secondary | ICD-10-CM | POA: Diagnosis not present

## 2017-09-12 DIAGNOSIS — I5022 Chronic systolic (congestive) heart failure: Secondary | ICD-10-CM | POA: Diagnosis not present

## 2017-09-12 DIAGNOSIS — I2510 Atherosclerotic heart disease of native coronary artery without angina pectoris: Secondary | ICD-10-CM | POA: Diagnosis not present

## 2017-09-12 DIAGNOSIS — G4733 Obstructive sleep apnea (adult) (pediatric): Secondary | ICD-10-CM | POA: Diagnosis not present

## 2017-09-13 ENCOUNTER — Encounter: Payer: Self-pay | Admitting: Internal Medicine

## 2017-09-13 ENCOUNTER — Ambulatory Visit: Payer: PPO | Admitting: Internal Medicine

## 2017-09-13 VITALS — BP 112/70 | HR 76 | Ht 65.0 in | Wt 218.0 lb

## 2017-09-13 DIAGNOSIS — Z95810 Presence of automatic (implantable) cardiac defibrillator: Secondary | ICD-10-CM

## 2017-09-13 DIAGNOSIS — I472 Ventricular tachycardia, unspecified: Secondary | ICD-10-CM

## 2017-09-13 DIAGNOSIS — I5022 Chronic systolic (congestive) heart failure: Secondary | ICD-10-CM

## 2017-09-13 IMAGING — CR DG CHEST 2V
2 series · 2 of 2 positions shown · non-contrast
Comparison: Chest radiograph performed 12/21/2015

CLINICAL DATA: Acute onset of cough and chest congestion. Initial
encounter.

EXAM:
CHEST  2 VIEW

[w chest pa]
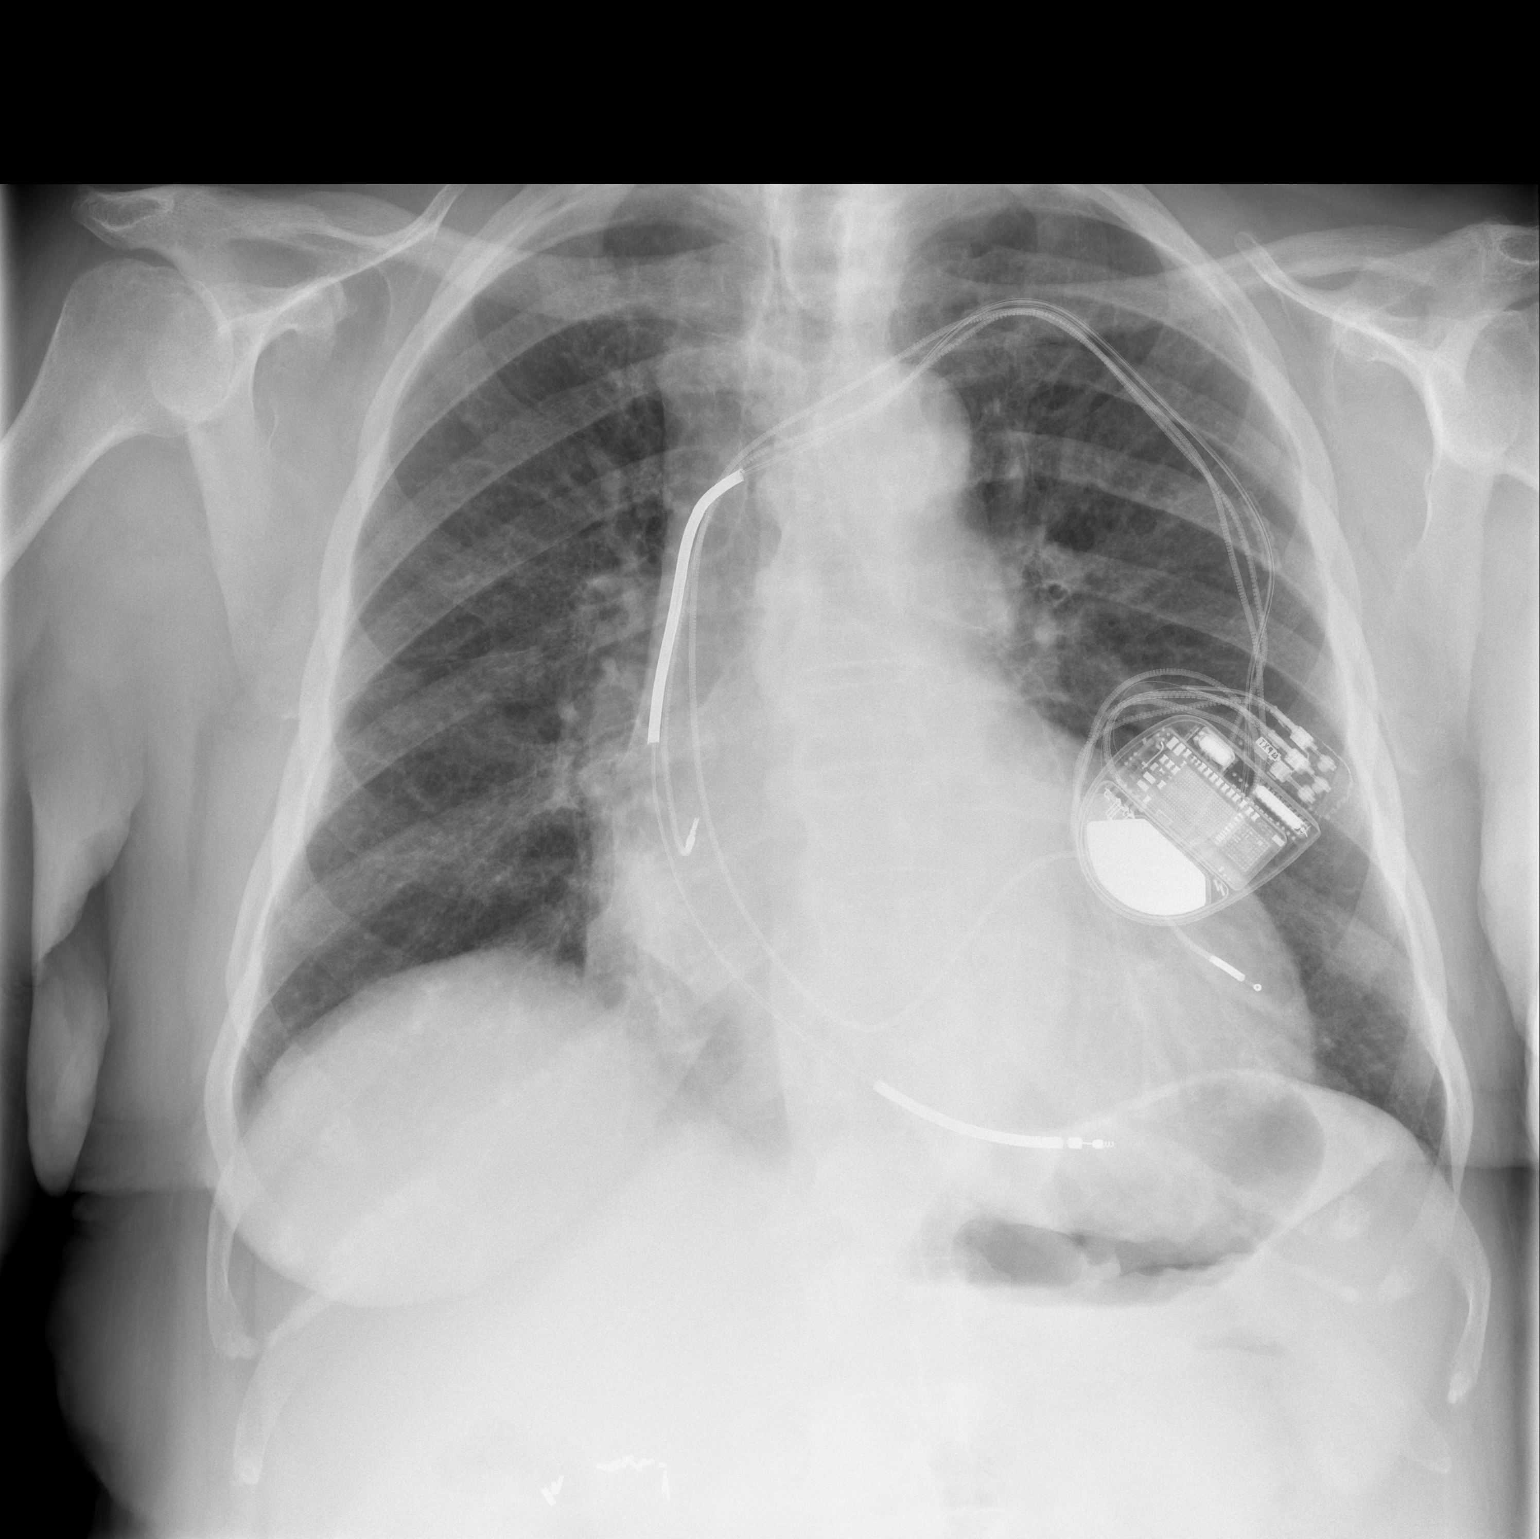

[w chest lat]
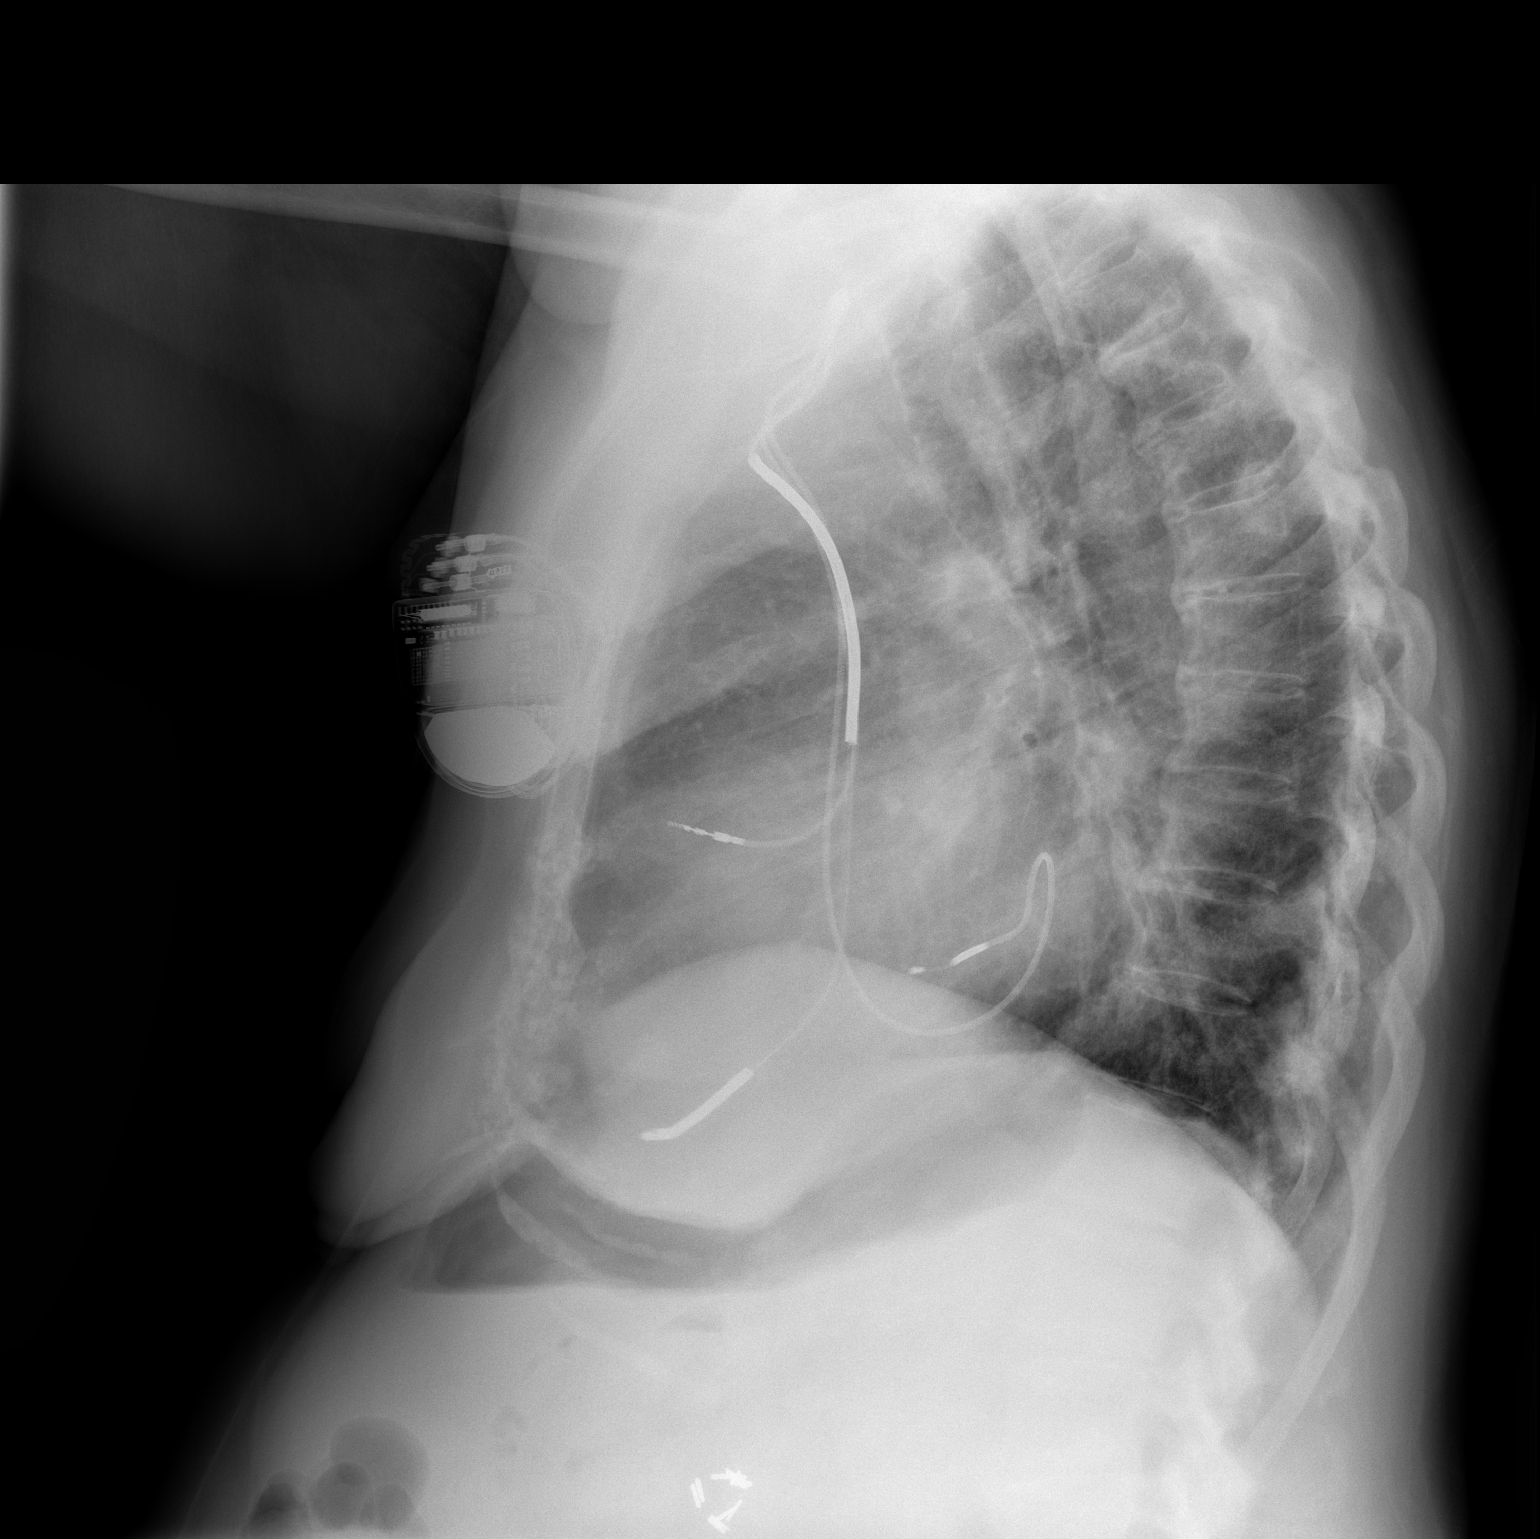

[2 of 2 positions shown; findings below may reference images not displayed]

FINDINGS: The lungs are well-aerated. Mild peribronchial thickening is noted.
There is no evidence of focal opacification, pleural effusion or
pneumothorax.

The heart is mildly enlarged. A pacemaker/AICD is noted at the left
chest wall, with leads ending at the right atrium, right ventricle
and coronary sinus. Clips are noted within the right upper quadrant,
reflecting prior cholecystectomy. No acute osseous abnormalities are
seen.
IMPRESSION: 1. Mild peribronchial thickening noted. Lungs otherwise grossly
clear.
2. Mild cardiomegaly.

## 2017-09-13 MED ORDER — AMIODARONE HCL 200 MG PO TABS: 200.0000 mg | ORAL_TABLET | Freq: Every day | ORAL | 3 refills | Status: DC

## 2017-09-13 MED ORDER — AMIODARONE HCL 200 MG PO TABS
200.0000 mg | ORAL_TABLET | Freq: Every day | ORAL | 3 refills | Status: DC
Start: 2017-09-13 — End: 2017-09-23
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
## 2017-09-13 NOTE — Patient Instructions (Addendum)
Medication Instructions:  Your physician has recommended you make the following change in your medication:  1.  Start taking amiodarone 200 mg one tablet by mouth daily.  Labwork: None ordered.  Testing/Procedures: None ordered.  Follow-Up: Your physician wants you to follow-up in: 3 months with Dr. Lovena Le.     Remote monitoring is used to monitor your ICD from home. This monitoring reduces the number of office visits required to check your device to one time per year. It allows [place] to keep an eye on the functioning of your device to ensure it is working properly. You are scheduled for a device check from home on 09/30/2017. You may send your transmission at any time that day. If you have a wireless device, the transmission will be sent automatically. After your physician reviews your transmission, you will receive a postcard with your next transmission date.  Any Other Special Instructions Will Be Listed Below (If Applicable).  If you need a refill on your cardiac medications before your next appointment, please call your pharmacy.

## 2017-09-16 ENCOUNTER — Encounter (HOSPITAL_COMMUNITY): Payer: Self-pay

## 2017-09-16 ENCOUNTER — Ambulatory Visit (HOSPITAL_BASED_OUTPATIENT_CLINIC_OR_DEPARTMENT_OTHER)
Admission: RE | Admit: 2017-09-16 | Discharge: 2017-09-16 | Disposition: A | Discharge status: Home / Self Care | Payer: PPO | Source: Ambulatory Visit | Attending: Internal Medicine | Admitting: Internal Medicine

## 2017-09-16 VITALS — BP 122/86 | HR 94 | Wt 213.6 lb

## 2017-09-16 DIAGNOSIS — Z95810 Presence of automatic (implantable) cardiac defibrillator: Secondary | ICD-10-CM | POA: Insufficient documentation

## 2017-09-16 DIAGNOSIS — Z7902 Long term (current) use of antithrombotics/antiplatelets: Secondary | ICD-10-CM

## 2017-09-16 DIAGNOSIS — I480 Paroxysmal atrial fibrillation: Secondary | ICD-10-CM | POA: Diagnosis not present

## 2017-09-16 DIAGNOSIS — B373 Candidiasis of vulva and vagina: Secondary | ICD-10-CM | POA: Diagnosis not present

## 2017-09-16 DIAGNOSIS — I130 Hypertensive heart and chronic kidney disease with heart failure and stage 1 through stage 4 chronic kidney disease, or unspecified chronic kidney disease: Secondary | ICD-10-CM | POA: Diagnosis not present

## 2017-09-16 DIAGNOSIS — N183 Chronic kidney disease, stage 3 unspecified: Secondary | ICD-10-CM

## 2017-09-16 DIAGNOSIS — E7800 Pure hypercholesterolemia, unspecified: Secondary | ICD-10-CM | POA: Insufficient documentation

## 2017-09-16 DIAGNOSIS — Z7901 Long term (current) use of anticoagulants: Secondary | ICD-10-CM | POA: Insufficient documentation

## 2017-09-16 DIAGNOSIS — G629 Polyneuropathy, unspecified: Secondary | ICD-10-CM

## 2017-09-16 DIAGNOSIS — R402441 Other coma, without documented Glasgow coma scale score, or with partial score reported, in the field [EMT or ambulance]: Secondary | ICD-10-CM | POA: Diagnosis not present

## 2017-09-16 DIAGNOSIS — Z87891 Personal history of nicotine dependence: Secondary | ICD-10-CM

## 2017-09-16 DIAGNOSIS — K7200 Acute and subacute hepatic failure without coma: Secondary | ICD-10-CM | POA: Diagnosis not present

## 2017-09-16 DIAGNOSIS — I472 Ventricular tachycardia, unspecified: Secondary | ICD-10-CM

## 2017-09-16 DIAGNOSIS — Z7982 Long term (current) use of aspirin: Secondary | ICD-10-CM

## 2017-09-16 DIAGNOSIS — I252 Old myocardial infarction: Secondary | ICD-10-CM

## 2017-09-16 DIAGNOSIS — Z79899 Other long term (current) drug therapy: Secondary | ICD-10-CM

## 2017-09-16 DIAGNOSIS — I429 Cardiomyopathy, unspecified: Secondary | ICD-10-CM

## 2017-09-16 DIAGNOSIS — G4733 Obstructive sleep apnea (adult) (pediatric): Secondary | ICD-10-CM | POA: Insufficient documentation

## 2017-09-16 DIAGNOSIS — N390 Urinary tract infection, site not specified: Secondary | ICD-10-CM | POA: Diagnosis not present

## 2017-09-16 DIAGNOSIS — Z9981 Dependence on supplemental oxygen: Secondary | ICD-10-CM

## 2017-09-16 DIAGNOSIS — I517 Cardiomegaly: Secondary | ICD-10-CM | POA: Diagnosis not present

## 2017-09-16 DIAGNOSIS — Z8249 Family history of ischemic heart disease and other diseases of the circulatory system: Secondary | ICD-10-CM | POA: Diagnosis not present

## 2017-09-16 DIAGNOSIS — K7689 Other specified diseases of liver: Secondary | ICD-10-CM | POA: Diagnosis not present

## 2017-09-16 DIAGNOSIS — I110 Hypertensive heart disease with heart failure: Secondary | ICD-10-CM | POA: Diagnosis not present

## 2017-09-16 DIAGNOSIS — F329 Major depressive disorder, single episode, unspecified: Secondary | ICD-10-CM

## 2017-09-16 DIAGNOSIS — Z515 Encounter for palliative care: Secondary | ICD-10-CM | POA: Diagnosis not present

## 2017-09-16 DIAGNOSIS — G9341 Metabolic encephalopathy: Secondary | ICD-10-CM | POA: Diagnosis not present

## 2017-09-16 DIAGNOSIS — Z85828 Personal history of other malignant neoplasm of skin: Secondary | ICD-10-CM | POA: Insufficient documentation

## 2017-09-16 DIAGNOSIS — B952 Enterococcus as the cause of diseases classified elsewhere: Secondary | ICD-10-CM | POA: Diagnosis not present

## 2017-09-16 DIAGNOSIS — S61217A Laceration without foreign body of left little finger without damage to nail, initial encounter: Secondary | ICD-10-CM | POA: Diagnosis not present

## 2017-09-16 DIAGNOSIS — X58XXXA Exposure to other specified factors, initial encounter: Secondary | ICD-10-CM | POA: Insufficient documentation

## 2017-09-16 DIAGNOSIS — I5023 Acute on chronic systolic (congestive) heart failure: Secondary | ICD-10-CM | POA: Diagnosis not present

## 2017-09-16 DIAGNOSIS — N179 Acute kidney failure, unspecified: Secondary | ICD-10-CM | POA: Diagnosis not present

## 2017-09-16 DIAGNOSIS — J9601 Acute respiratory failure with hypoxia: Secondary | ICD-10-CM | POA: Diagnosis not present

## 2017-09-16 DIAGNOSIS — I2510 Atherosclerotic heart disease of native coronary artery without angina pectoris: Secondary | ICD-10-CM | POA: Diagnosis not present

## 2017-09-16 DIAGNOSIS — I5032 Chronic diastolic (congestive) heart failure: Secondary | ICD-10-CM | POA: Diagnosis not present

## 2017-09-16 DIAGNOSIS — R443 Hallucinations, unspecified: Secondary | ICD-10-CM | POA: Diagnosis not present

## 2017-09-16 DIAGNOSIS — I509 Heart failure, unspecified: Secondary | ICD-10-CM | POA: Diagnosis not present

## 2017-09-16 DIAGNOSIS — I361 Nonrheumatic tricuspid (valve) insufficiency: Secondary | ICD-10-CM | POA: Diagnosis not present

## 2017-09-16 DIAGNOSIS — R740 Nonspecific elevation of levels of transaminase and lactic acid dehydrogenase [LDH]: Secondary | ICD-10-CM | POA: Diagnosis not present

## 2017-09-16 DIAGNOSIS — R188 Other ascites: Secondary | ICD-10-CM | POA: Diagnosis not present

## 2017-09-16 DIAGNOSIS — E7220 Disorder of urea cycle metabolism, unspecified: Secondary | ICD-10-CM | POA: Diagnosis not present

## 2017-09-16 DIAGNOSIS — F4489 Other dissociative and conversion disorders: Secondary | ICD-10-CM | POA: Diagnosis not present

## 2017-09-16 DIAGNOSIS — A419 Sepsis, unspecified organism: Secondary | ICD-10-CM | POA: Diagnosis not present

## 2017-09-16 DIAGNOSIS — S301XXA Contusion of abdominal wall, initial encounter: Secondary | ICD-10-CM

## 2017-09-16 DIAGNOSIS — R945 Abnormal results of liver function studies: Secondary | ICD-10-CM | POA: Diagnosis not present

## 2017-09-16 DIAGNOSIS — I493 Ventricular premature depolarization: Secondary | ICD-10-CM

## 2017-09-16 DIAGNOSIS — J9621 Acute and chronic respiratory failure with hypoxia: Secondary | ICD-10-CM | POA: Diagnosis not present

## 2017-09-16 DIAGNOSIS — Z87440 Personal history of urinary (tract) infections: Secondary | ICD-10-CM | POA: Diagnosis not present

## 2017-09-16 DIAGNOSIS — I5022 Chronic systolic (congestive) heart failure: Secondary | ICD-10-CM | POA: Insufficient documentation

## 2017-09-16 DIAGNOSIS — I340 Nonrheumatic mitral (valve) insufficiency: Secondary | ICD-10-CM | POA: Diagnosis not present

## 2017-09-16 DIAGNOSIS — I5021 Acute systolic (congestive) heart failure: Secondary | ICD-10-CM | POA: Diagnosis not present

## 2017-09-16 DIAGNOSIS — M109 Gout, unspecified: Secondary | ICD-10-CM

## 2017-09-16 DIAGNOSIS — Z7189 Other specified counseling: Secondary | ICD-10-CM | POA: Diagnosis not present

## 2017-09-16 DIAGNOSIS — R4020 Unspecified coma: Secondary | ICD-10-CM | POA: Diagnosis not present

## 2017-09-16 DIAGNOSIS — R410 Disorientation, unspecified: Secondary | ICD-10-CM | POA: Diagnosis not present

## 2017-09-16 LAB — COMPREHENSIVE METABOLIC PANEL
ALT: 13 U/L — ABNORMAL LOW (ref 14–54)
ANION GAP: 13 (ref 5–15)
AST: 24 U/L (ref 15–41)
Albumin: 3.8 g/dL (ref 3.5–5.0)
Alkaline Phosphatase: 79 U/L (ref 38–126)
BUN: 37 mg/dL — ABNORMAL HIGH (ref 6–20)
CHLORIDE: 95 mmol/L — AB (ref 101–111)
CO2: 29 mmol/L (ref 22–32)
CREATININE: 1.19 mg/dL — AB (ref 0.44–1.00)
Calcium: 9.5 mg/dL (ref 8.9–10.3)
GFR, EST AFRICAN AMERICAN: 48 mL/min — AB (ref >60)
GFR, EST NON AFRICAN AMERICAN: 41 mL/min — AB (ref >60)
Glucose, Bld: 97 mg/dL (ref 65–99)
POTASSIUM: 2.8 mmol/L — AB (ref 3.5–5.1)
SODIUM: 137 mmol/L (ref 135–145)
Total Bilirubin: 1.5 mg/dL — ABNORMAL HIGH (ref 0.3–1.2)
Total Protein: 7.5 g/dL (ref 6.5–8.1)

## 2017-09-16 LAB — AMMONIA: Ammonia: 52 umol/L — ABNORMAL HIGH (ref 9–35)

## 2017-09-16 NOTE — Patient Instructions (Addendum)
Routine lab work today. Will notify you of abnormal results, otherwise no news is good news!  No changes to medication at this time.  Follow up 4 weeks with Oda Kilts PA-C.  ____________________________________________________________ [place] Roberts Code:   Follow up 2 months with Dr. Haroldine Laws.  _____________________________________________________________ [place] Roberts Code: 6073  Take all medication as prescribed the day of your appointment. Bring all medications with you to your appointment.  Do the following things EVERYDAY: 1) Weigh yourself in the morning before breakfast. Write it down and keep it in a log. 2) Take your medicines as prescribed 3) Eat low salt foods-Limit salt (sodium) to 2000 mg per day.  4) Stay as active as you can everyday 5) Limit all fluids for the day to less than 2 liters
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
## 2017-09-17 ENCOUNTER — Ambulatory Visit: Payer: Self-pay | Admitting: *Deleted

## 2017-09-17 ENCOUNTER — Other Ambulatory Visit: Payer: Self-pay | Admitting: *Deleted

## 2017-09-17 NOTE — Patient Outreach (Signed)
[facility]) Care Management  09/17/2017  ZADIE DEEMER 1932/07/10 [id number]   CSW called & spoke with patient's daughter, [name] at [phone] to confirm that patient had discharged back to [facility] from [facility] yesterday, 09/16/17. Daughter reports that she has been doing well and is getting setup with [facility] for therapy. Patient's daughter was very appreciative of [facility] involvement and states that she felt taken care of when [facility] RNCM, [place] Brick was following patient before. Patient's daughter agreed to have [place] follow again for transition of care & requested [facility] pharmacist as some of her medications have changed. CSW will make referrals & sign off as no further CSW needs identified at this time.    Raynaldo Opitz, LCSW [facility]  Clinical Social Worker cell #: [phone]

## 2017-09-18 ENCOUNTER — Other Ambulatory Visit: Payer: Self-pay | Admitting: Pharmacist

## 2017-09-18 ENCOUNTER — Telehealth (HOSPITAL_COMMUNITY): Payer: Self-pay

## 2017-09-18 NOTE — Patient Outreach (Signed)
[facility]) Care Management  09/18/2017  Bianca [name] 31-Jul-1932 [id number]   84 y.o. year old female referred to [facility] for Medication Management (Initial pharmacy outreach- new medications) Medication review performed at the request of daughter in law as patient has new medications after discharge from SNF.   PMH s/f: CHF, atrial fibrillation, CAD, HTN, OSA, neuropathy, osteoarthritis, CKD (Stage III), obesity.  Patient with [employer] Medicare Advantage plan.   Patient's daughter in law [name] confirms patient's identity with HIPAA-identifiers x2 and gives verbal consent to speak over the phone about medications.   SUBJECTIVE:   Medication Adherence: Patient uses pill boxes that are filled by [name]. Reports 100% adherence at this time.    Medication Assistance:  [name] denies need at this time, is aware that patient might reach the donut hole later this fall.    Medication Management:  Request for medication review was made because patient had new medications upon discharge at the SNF. [name] is unsure if these new medications came from patient's most recent hospitalization or if they came from the SNF. Two medication reviews have been performed by [facility] pharmacists, [place] Ormond and [place], within the past 6 weeks.   OBJECTIVE: Medications Reviewed Today    Reviewed by Bertis Ruddy, [facility] (Pharmacist) on 09/18/17 at 1600  Med List Status: <None>  Medication Order Taking? Sig Documenting Provider Last Dose Status Informant  acetaminophen (TYLENOL) 650 MG CR tablet [id number] Yes Take 1,300 mg by mouth 2 (two) times daily before a meal. [provider] Taking Active Family Member  allopurinol (ZYLOPRIM) 100 MG tablet [id number] Yes Take 100 mg by mouth See admin instructions. Takes along with a 300 mg tablet to equal 400 mg  [provider] Taking Active Family Member  allopurinol (ZYLOPRIM) 300 MG tablet [id number] Yes Take 300 mg by  mouth See admin instructions. Take with the 100mg  tablet to equal 400mg  daily  [provider] Taking Active Family Member  amiodarone (PACERONE) 200 MG tablet [id number] Yes Take 1 tablet (200 mg total) by mouth daily. Evans Lance, MD Taking Active Family Member  apixaban [facility]) 5 MG TABS tablet [id number] Yes Take 5 mg by mouth 2 (two) times daily. [provider] Taking Active Family Member  aspirin 81 MG chewable tablet [id number] No Chew 1 tablet (81 mg total) by mouth daily.  Patient not taking:  Reported on 09/18/2017   Darrick Grinder D, NP Not Taking Active Family Member  atorvastatin (LIPITOR) 40 MG tablet [id number] Yes Take 1 tablet (40 mg total) by mouth daily. Darrick Grinder D, NP Taking Active Family Member  Calcium Carbonate-Vit D-Min (CALCIUM/VITAMIN D/MINERALS) 600-200 MG-UNIT TABS [id number] Yes Take 1 tablet by mouth 2 (two) times daily.  [provider] Taking Active Family Member  Cholecalciferol (VITAMIN D3) 1000 UNITS CAPS [id number] Yes Take 1,000 Units by mouth daily.  [provider] Taking Active Family Member  clopidogrel (PLAVIX) 75 MG tablet [id number] Yes Take 1 tablet (75 mg total) by mouth daily with breakfast. Ninfa Meeker, Amy D, NP Taking Active Family Member  colestipol (COLESTID) 1 G tablet [id number] Yes Take 2 g by mouth at bedtime.  [provider] Taking Active Family Member           Med Note Ileene Patrick K   Wed Feb 01, 2016 10:05 AM)    gabapentin (NEURONTIN) 400 MG capsule [id number] Yes Take 1 capsule (400 mg total) by mouth 3 (three) times daily. Raiford Noble [place], DO Taking Active  Family Member  guaiFENesin (MUCINEX) 600 MG 12 hr tablet [id number] Yes Take 1 tablet (600 mg total) by mouth 2 (two) times daily. Theodis Blaze, MD Taking Active Family Member  isosorbide mononitrate (IMDUR) 30 MG 24 hr tablet [id number] Yes Take 0.5 tablets (15 mg total) by mouth daily. Isaiah Serge, NP Taking Active Family Member  metolazone  (ZAROXOLYN) 2.5 MG tablet [id number] Yes Take 2.5 mg by mouth once a week. [provider] Taking Active Family Member           Med Note Tye Savoy, Coral Spikes Sep 18, 2017 10:43 AM) Fridays  metoprolol succinate (TOPROL-XL) 25 MG 24 hr tablet [id number] Yes Take 1 tablet (25 mg total) by mouth daily. Jerline Pain, MD Taking Active Family Member           Med Note [facility], [facility]   Wed Aug 14, 2017  3:18 PM)    nitroGLYCERIN (NITROSTAT) 0.4 MG SL tablet [id number] Yes Place 1 tablet (0.4 mg total) under the tongue every 5 (five) minutes as needed for chest pain. Jettie Booze, MD Taking Active Family Member  nortriptyline [facility]) 25 MG capsule [id number] Yes Take 25 mg by mouth at bedtime. [provider] Taking Active Family Member  OXYGEN [id number] Yes Inhale 2 L/min into the lungs at bedtime as needed (uses as bedtime everynight and then occasionally iun the daytime as needed for SOB).  [provider] Taking Active Family Member           Med Note Jamse Arn, Missy Sabins   Tue Aug 20, 2017  8:05 AM) Uses with cpap  pantoprazole (PROTONIX) 40 MG tablet [id number] Yes Take 1 tablet (40 mg total) by mouth daily. Georgiana Shore, NP Taking Active Family Member  potassium chloride SA (K-DUR,KLOR-CON) 20 MEQ tablet [id number] Yes Take 1 tablet (20 mEq total) by mouth 2 (two) times daily.  Patient taking differently:  Take 40 mEq by mouth 2 (two) times daily.    Georgiana Shore, NP Taking Active Family Member           Med Note Tye Savoy, Blair Heys   Wed Sep 18, 2017 10:48 AM) [place]  Probiotic Product (PROBIOTIC PO) [id number] Yes Take 1 tablet by mouth daily. [provider] Taking Active Family Member  senna-docusate (SENOKOT-S) 8.6-50 MG tablet [id number] Yes Take 1 tablet by mouth 2 (two) times daily as needed for mild constipation.  [provider] Taking Active Family Member           Med Note Winfield Cunas, [facility] A   Wed Aug 14, 2017  3:19 PM)  As needed  spironolactone (ALDACTONE) 25 MG tablet [id number] Yes Take 1 tablet (25 mg total) by mouth daily.  Patient taking differently:  Take 12.5 mg by mouth daily.    Bensimhon, Shaune Pascal, MD Taking Active Family Member  torsemide [facility]) 20 MG tablet [id number] Yes Take 80 mg by mouth in the AM and 40 mg by mouth in the PM [provider] Taking Active Family Member           Med Note Tye Savoy, Merek Niu E   Wed Sep 18, 2017 10:50 AM) 7 am and 1 pm  Med List Note [place] Silos, Gwinda Passe, CPhT 07/28/17 1124): [place]         Drugs sorted by system:  Neurologic/Psychologic: gabapentin, nortriptyline,  Cardiovascular: amiodarone, apixaban, atorvastatin, clopidogrel, colestipol, isosorbide mononitrate, metolazone, metoprolol succinate, nitroglycerin, potassium chloride, spironolactone, torsemide  Pulmonary/Allergy:  guaifenesin  Gastrointestinal: pantoprazole, probiotic, senna-docusate  Pain: acetaminophen  Vitamins/Minerals: calcium + Vitamin D, cholecalciferol  Miscellaneous: allopurinol   ASSESSMENT: Duplications in therapy: none noted Gaps in therapy: none noted Medications to avoid in the elderly: nortriptyline, gabapentin, pantoprazole Drug interactions: clopidogrel-pantoprazole; pantoprazole-calcium, potassium chloride-spironolactone Other issues noted: In conversation, [name] mentions that when patient increased gabapentin dose, it increased pain to the extent that patient now takes APAP 1300mg  BID (below 3000mg  daily goal, but AST elevated  08/29/17)  PLAN: Investigate source/prescriber of pantoprazole to help assess therapeutic need in light of age and drug-drug interactions. Follow up with [name] within 5 business days.     Charlett Lango, PharmD Clinical Pharmacist, [facility] [phone]

## 2017-09-18 NOTE — Telephone Encounter (Signed)
HHRN with [facility] called to get VO to delay start of care until tomorrow per patient request. Ok given per VO Dr. Haroldine Laws.  Renee Pain, RN

## 2017-09-19 ENCOUNTER — Encounter (HOSPITAL_COMMUNITY): Payer: Self-pay

## 2017-09-19 ENCOUNTER — Inpatient Hospital Stay (HOSPITAL_COMMUNITY)
Admission: EM | Admit: 2017-09-19 | Discharge: 2017-09-23 | DRG: 871 | Disposition: A | Discharge status: Hospice | Payer: PPO | Attending: Family Medicine | Admitting: Family Medicine

## 2017-09-19 ENCOUNTER — Encounter (HOSPITAL_COMMUNITY): Payer: Self-pay | Admitting: Nurse Practitioner

## 2017-09-19 ENCOUNTER — Emergency Department (HOSPITAL_COMMUNITY): Payer: PPO

## 2017-09-19 ENCOUNTER — Encounter: Payer: Self-pay | Admitting: *Deleted

## 2017-09-19 ENCOUNTER — Other Ambulatory Visit: Payer: Self-pay | Admitting: *Deleted

## 2017-09-19 DIAGNOSIS — I252 Old myocardial infarction: Secondary | ICD-10-CM

## 2017-09-19 DIAGNOSIS — G9341 Metabolic encephalopathy: Secondary | ICD-10-CM | POA: Diagnosis present

## 2017-09-19 DIAGNOSIS — J9601 Acute respiratory failure with hypoxia: Secondary | ICD-10-CM | POA: Diagnosis present

## 2017-09-19 DIAGNOSIS — Z6835 Body mass index (BMI) 35.0-35.9, adult: Secondary | ICD-10-CM

## 2017-09-19 DIAGNOSIS — A419 Sepsis, unspecified organism: Secondary | ICD-10-CM | POA: Diagnosis present

## 2017-09-19 DIAGNOSIS — R740 Nonspecific elevation of levels of transaminase and lactic acid dehydrogenase [LDH]: Secondary | ICD-10-CM

## 2017-09-19 DIAGNOSIS — I5023 Acute on chronic systolic (congestive) heart failure: Secondary | ICD-10-CM | POA: Diagnosis present

## 2017-09-19 DIAGNOSIS — E7220 Disorder of urea cycle metabolism, unspecified: Secondary | ICD-10-CM | POA: Diagnosis present

## 2017-09-19 DIAGNOSIS — N390 Urinary tract infection, site not specified: Secondary | ICD-10-CM | POA: Diagnosis present

## 2017-09-19 DIAGNOSIS — K7290 Hepatic failure, unspecified without coma: Secondary | ICD-10-CM | POA: Diagnosis present

## 2017-09-19 DIAGNOSIS — R443 Hallucinations, unspecified: Secondary | ICD-10-CM | POA: Diagnosis present

## 2017-09-19 DIAGNOSIS — Z7982 Long term (current) use of aspirin: Secondary | ICD-10-CM | POA: Diagnosis not present

## 2017-09-19 DIAGNOSIS — I472 Ventricular tachycardia: Secondary | ICD-10-CM | POA: Diagnosis present

## 2017-09-19 DIAGNOSIS — I509 Heart failure, unspecified: Secondary | ICD-10-CM | POA: Diagnosis not present

## 2017-09-19 DIAGNOSIS — Z9981 Dependence on supplemental oxygen: Secondary | ICD-10-CM

## 2017-09-19 DIAGNOSIS — K761 Chronic passive congestion of liver: Secondary | ICD-10-CM | POA: Diagnosis present

## 2017-09-19 DIAGNOSIS — I361 Nonrheumatic tricuspid (valve) insufficiency: Secondary | ICD-10-CM | POA: Diagnosis not present

## 2017-09-19 DIAGNOSIS — Z7189 Other specified counseling: Secondary | ICD-10-CM

## 2017-09-19 DIAGNOSIS — B373 Candidiasis of vulva and vagina: Secondary | ICD-10-CM | POA: Diagnosis present

## 2017-09-19 DIAGNOSIS — Z515 Encounter for palliative care: Secondary | ICD-10-CM

## 2017-09-19 DIAGNOSIS — R778 Other specified abnormalities of plasma proteins: Secondary | ICD-10-CM | POA: Diagnosis present

## 2017-09-19 DIAGNOSIS — I5084 End stage heart failure: Secondary | ICD-10-CM | POA: Diagnosis present

## 2017-09-19 DIAGNOSIS — I5021 Acute systolic (congestive) heart failure: Secondary | ICD-10-CM | POA: Diagnosis present

## 2017-09-19 DIAGNOSIS — Z85828 Personal history of other malignant neoplasm of skin: Secondary | ICD-10-CM | POA: Diagnosis not present

## 2017-09-19 DIAGNOSIS — J9621 Acute and chronic respiratory failure with hypoxia: Secondary | ICD-10-CM | POA: Diagnosis present

## 2017-09-19 DIAGNOSIS — K7200 Acute and subacute hepatic failure without coma: Secondary | ICD-10-CM | POA: Diagnosis not present

## 2017-09-19 DIAGNOSIS — R410 Disorientation, unspecified: Secondary | ICD-10-CM | POA: Diagnosis present

## 2017-09-19 DIAGNOSIS — S301XXA Contusion of abdominal wall, initial encounter: Secondary | ICD-10-CM | POA: Diagnosis present

## 2017-09-19 DIAGNOSIS — Z79899 Other long term (current) drug therapy: Secondary | ICD-10-CM

## 2017-09-19 DIAGNOSIS — E7800 Pure hypercholesterolemia, unspecified: Secondary | ICD-10-CM | POA: Diagnosis present

## 2017-09-19 DIAGNOSIS — B3731 Acute candidiasis of vulva and vagina: Secondary | ICD-10-CM | POA: Diagnosis present

## 2017-09-19 DIAGNOSIS — S61217A Laceration without foreign body of left little finger without damage to nail, initial encounter: Secondary | ICD-10-CM | POA: Diagnosis present

## 2017-09-19 DIAGNOSIS — Z7902 Long term (current) use of antithrombotics/antiplatelets: Secondary | ICD-10-CM

## 2017-09-19 DIAGNOSIS — N183 Chronic kidney disease, stage 3 unspecified: Secondary | ICD-10-CM | POA: Diagnosis present

## 2017-09-19 DIAGNOSIS — Z7901 Long term (current) use of anticoagulants: Secondary | ICD-10-CM | POA: Diagnosis not present

## 2017-09-19 DIAGNOSIS — Z8249 Family history of ischemic heart disease and other diseases of the circulatory system: Secondary | ICD-10-CM

## 2017-09-19 DIAGNOSIS — I340 Nonrheumatic mitral (valve) insufficiency: Secondary | ICD-10-CM | POA: Diagnosis not present

## 2017-09-19 DIAGNOSIS — I429 Cardiomyopathy, unspecified: Secondary | ICD-10-CM | POA: Diagnosis present

## 2017-09-19 DIAGNOSIS — I130 Hypertensive heart and chronic kidney disease with heart failure and stage 1 through stage 4 chronic kidney disease, or unspecified chronic kidney disease: Secondary | ICD-10-CM | POA: Diagnosis present

## 2017-09-19 DIAGNOSIS — R451 Restlessness and agitation: Secondary | ICD-10-CM | POA: Diagnosis not present

## 2017-09-19 DIAGNOSIS — Z823 Family history of stroke: Secondary | ICD-10-CM

## 2017-09-19 DIAGNOSIS — I480 Paroxysmal atrial fibrillation: Secondary | ICD-10-CM | POA: Diagnosis present

## 2017-09-19 DIAGNOSIS — S61219A Laceration without foreign body of unspecified finger without damage to nail, initial encounter: Secondary | ICD-10-CM | POA: Diagnosis present

## 2017-09-19 DIAGNOSIS — Z9861 Coronary angioplasty status: Secondary | ICD-10-CM

## 2017-09-19 DIAGNOSIS — N179 Acute kidney failure, unspecified: Secondary | ICD-10-CM | POA: Diagnosis present

## 2017-09-19 DIAGNOSIS — B952 Enterococcus as the cause of diseases classified elsewhere: Secondary | ICD-10-CM | POA: Diagnosis not present

## 2017-09-19 DIAGNOSIS — Z8679 Personal history of other diseases of the circulatory system: Secondary | ICD-10-CM

## 2017-09-19 DIAGNOSIS — E876 Hypokalemia: Secondary | ICD-10-CM | POA: Diagnosis present

## 2017-09-19 DIAGNOSIS — G4733 Obstructive sleep apnea (adult) (pediatric): Secondary | ICD-10-CM | POA: Diagnosis present

## 2017-09-19 DIAGNOSIS — Z87440 Personal history of urinary (tract) infections: Secondary | ICD-10-CM | POA: Diagnosis present

## 2017-09-19 DIAGNOSIS — R7989 Other specified abnormal findings of blood chemistry: Secondary | ICD-10-CM | POA: Diagnosis present

## 2017-09-19 DIAGNOSIS — Z66 Do not resuscitate: Secondary | ICD-10-CM | POA: Diagnosis present

## 2017-09-19 DIAGNOSIS — I2510 Atherosclerotic heart disease of native coronary artery without angina pectoris: Secondary | ICD-10-CM | POA: Diagnosis present

## 2017-09-19 DIAGNOSIS — R32 Unspecified urinary incontinence: Secondary | ICD-10-CM | POA: Diagnosis present

## 2017-09-19 DIAGNOSIS — Z95810 Presence of automatic (implantable) cardiac defibrillator: Secondary | ICD-10-CM | POA: Diagnosis not present

## 2017-09-19 DIAGNOSIS — E875 Hyperkalemia: Secondary | ICD-10-CM | POA: Diagnosis present

## 2017-09-19 DIAGNOSIS — Z888 Allergy status to other drugs, medicaments and biological substances status: Secondary | ICD-10-CM

## 2017-09-19 DIAGNOSIS — Z87891 Personal history of nicotine dependence: Secondary | ICD-10-CM

## 2017-09-19 DIAGNOSIS — R7401 Elevation of levels of liver transaminase levels: Secondary | ICD-10-CM

## 2017-09-19 DIAGNOSIS — R188 Other ascites: Secondary | ICD-10-CM | POA: Diagnosis present

## 2017-09-19 LAB — CBC WITH DIFFERENTIAL/PLATELET
BASOS ABS: 0 10*3/uL (ref 0.0–0.1)
BASOS PCT: 0 %
BASOS PCT: 0 %
Basophils Absolute: 0 10*3/uL (ref 0.0–0.1)
EOS PCT: 0 %
Eosinophils Absolute: 0 10*3/uL (ref 0.0–0.7)
Eosinophils Absolute: 0.1 10*3/uL (ref 0.0–0.7)
Eosinophils Relative: 1 %
HEMATOCRIT: 37.4 % (ref 36.0–46.0)
HEMATOCRIT: 37.4 % (ref 36.0–46.0)
HEMOGLOBIN: 11.6 g/dL — AB (ref 12.0–15.0)
Hemoglobin: 11.9 g/dL — ABNORMAL LOW (ref 12.0–15.0)
LYMPHS ABS: 2.7 10*3/uL (ref 0.7–4.0)
LYMPHS PCT: 35 %
Lymphocytes Relative: 31 %
Lymphs Abs: 2.3 10*3/uL (ref 0.7–4.0)
MCH: 29.2 pg (ref 26.0–34.0)
MCH: 28.5 pg (ref 26.0–34.0)
MCHC: 31.8 g/dL (ref 30.0–36.0)
MCHC: 31 g/dL (ref 30.0–36.0)
MCV: 91.9 fL (ref 78.0–100.0)
MCV: 91.9 fL (ref 78.0–100.0)
MONO ABS: 0.6 10*3/uL (ref 0.1–1.0)
MONOS PCT: 8 %
MONOS PCT: 9 %
Monocytes Absolute: 0.7 10*3/uL (ref 0.1–1.0)
NEUTROS ABS: 4.5 10*3/uL (ref 1.7–7.7)
NEUTROS ABS: 4.2 10*3/uL (ref 1.7–7.7)
NEUTROS PCT: 55 %
Neutrophils Relative %: 61 %
PLATELETS: 246 10*3/uL (ref 150–400)
Platelets: 241 10*3/uL (ref 150–400)
RBC: 4.07 MIL/uL (ref 3.87–5.11)
RBC: 4.07 MIL/uL (ref 3.87–5.11)
RDW: 16.1 % — AB (ref 11.5–15.5)
RDW: 15.9 % — ABNORMAL HIGH (ref 11.5–15.5)
WBC: 7.4 10*3/uL (ref 4.0–10.5)
WBC: 7.7 10*3/uL (ref 4.0–10.5)

## 2017-09-19 LAB — COMPREHENSIVE METABOLIC PANEL
ALBUMIN: 3.9 g/dL (ref 3.5–5.0)
ALT: 16 U/L (ref 14–54)
ANION GAP: 15 (ref 5–15)
AST: 29 U/L (ref 15–41)
Alkaline Phosphatase: 77 U/L (ref 38–126)
BILIRUBIN TOTAL: 1.9 mg/dL — AB (ref 0.3–1.2)
BUN: 41 mg/dL — ABNORMAL HIGH (ref 6–20)
CO2: 23 mmol/L (ref 22–32)
Calcium: 9.7 mg/dL (ref 8.9–10.3)
Chloride: 96 mmol/L — ABNORMAL LOW (ref 101–111)
Creatinine, Ser: 1.56 mg/dL — ABNORMAL HIGH (ref 0.44–1.00)
GFR calc Af Amer: 35 mL/min — ABNORMAL LOW (ref >60)
GFR, EST NON AFRICAN AMERICAN: 30 mL/min — AB (ref >60)
Glucose, Bld: 113 mg/dL — ABNORMAL HIGH (ref 65–99)
POTASSIUM: 4.4 mmol/L (ref 3.5–5.1)
Sodium: 134 mmol/L — ABNORMAL LOW (ref 135–145)
TOTAL PROTEIN: 7.1 g/dL (ref 6.5–8.1)

## 2017-09-19 LAB — URINALYSIS, ROUTINE W REFLEX MICROSCOPIC
BILIRUBIN URINE: NEGATIVE
Glucose, UA: NEGATIVE mg/dL
HGB URINE DIPSTICK: NEGATIVE
KETONES UR: NEGATIVE mg/dL
Leukocytes, UA: NEGATIVE
Nitrite: NEGATIVE
PROTEIN: NEGATIVE mg/dL
Specific Gravity, Urine: 1.013 (ref 1.005–1.030)
pH: 5 (ref 5.0–8.0)

## 2017-09-19 LAB — PROTIME-INR
INR: 1.89
Prothrombin Time: 21.5 seconds — ABNORMAL HIGH (ref 11.4–15.2)

## 2017-09-19 LAB — PROCALCITONIN: Procalcitonin: 0.13 ng/mL

## 2017-09-19 LAB — MRSA PCR SCREENING: MRSA BY PCR: NEGATIVE

## 2017-09-19 LAB — SEDIMENTATION RATE: Sed Rate: 24 mm/hr — ABNORMAL HIGH (ref 0–22)

## 2017-09-19 LAB — MAGNESIUM: MAGNESIUM: 1.9 mg/dL (ref 1.7–2.4)

## 2017-09-19 LAB — APTT: aPTT: 41 seconds — ABNORMAL HIGH (ref 24–36)

## 2017-09-19 LAB — BRAIN NATRIURETIC PEPTIDE: B NATRIURETIC PEPTIDE 5: 2673.5 pg/mL — AB (ref 0.0–100.0)

## 2017-09-19 LAB — TROPONIN I
TROPONIN I: 0.06 ng/mL — AB (ref <0.03)
TROPONIN I: 0.06 ng/mL — AB (ref <0.03)
Troponin I: 0.06 ng/mL (ref <0.03)

## 2017-09-19 LAB — LACTIC ACID, PLASMA
LACTIC ACID, VENOUS: 1.4 mmol/L (ref 0.5–1.9)
Lactic Acid, Venous: 1.5 mmol/L (ref 0.5–1.9)

## 2017-09-19 LAB — I-STAT CG4 LACTIC ACID, ED: Lactic Acid, Venous: 1.89 mmol/L (ref 0.5–1.9)

## 2017-09-19 LAB — AMMONIA: Ammonia: 40 umol/L — ABNORMAL HIGH (ref 9–35)

## 2017-09-19 MED ORDER — CLOTRIMAZOLE 1 % VA CREA: 1.0000 | TOPICAL_CREAM | Freq: Every day | VAGINAL | Status: DC

## 2017-09-19 MED ORDER — APIXABAN 5 MG PO TABS
5.0000 mg | ORAL_TABLET | Freq: Two times a day (BID) | ORAL | Status: DC
  Administered 2017-09-19 – 2017-09-22 (×7): 5 mg via ORAL
  Filled 2017-09-19 (×8): qty 1

## 2017-09-19 MED ORDER — AMIODARONE HCL 200 MG PO TABS
200.0000 mg | ORAL_TABLET | Freq: Every day | ORAL | Status: DC
  Administered 2017-09-19 – 2017-09-20 (×2): 200 mg via ORAL
  Filled 2017-09-19 (×2): qty 1

## 2017-09-19 MED ORDER — SODIUM CHLORIDE 0.9 % IV SOLN
1.0000 g | Freq: Four times a day (QID) | INTRAVENOUS | Status: DC
  Administered 2017-09-19 – 2017-09-22 (×11): 1 g via INTRAVENOUS
  Filled 2017-09-19 (×13): qty 1000

## 2017-09-19 MED ORDER — SODIUM CHLORIDE 0.9% FLUSH: 3.0000 mL | INTRAVENOUS | Status: DC | PRN

## 2017-09-19 MED ORDER — SODIUM CHLORIDE 0.9 % IV SOLN: 2.0000 g | Freq: Once | INTRAVENOUS | Status: DC

## 2017-09-19 MED ORDER — ACETAMINOPHEN 325 MG PO TABS
650.0000 mg | ORAL_TABLET | ORAL | Status: DC | PRN
  Administered 2017-09-20 – 2017-09-22 (×2): 650 mg via ORAL
  Filled 2017-09-19 (×3): qty 2

## 2017-09-19 MED ORDER — FUROSEMIDE 10 MG/ML IJ SOLN
80.0000 mg | Freq: Two times a day (BID) | INTRAMUSCULAR | Status: AC
  Administered 2017-09-19 – 2017-09-20 (×2): 80 mg via INTRAVENOUS
  Filled 2017-09-19 (×3): qty 8

## 2017-09-19 MED ORDER — CLOTRIMAZOLE 1 % VA CREA
1.0000 | TOPICAL_CREAM | Freq: Every day | VAGINAL | Status: DC
  Administered 2017-09-19 – 2017-09-22 (×4): 1 via VAGINAL
  Filled 2017-09-19: qty 45

## 2017-09-19 MED ORDER — ONDANSETRON HCL 4 MG/2ML IJ SOLN
4.0000 mg | Freq: Four times a day (QID) | INTRAMUSCULAR | Status: DC | PRN
  Administered 2017-09-20 – 2017-09-23 (×2): 4 mg via INTRAVENOUS
  Filled 2017-09-19 (×2): qty 2

## 2017-09-19 MED ORDER — CLOPIDOGREL BISULFATE 75 MG PO TABS
75.0000 mg | ORAL_TABLET | Freq: Every day | ORAL | Status: DC
  Administered 2017-09-20 – 2017-09-23 (×4): 75 mg via ORAL
  Filled 2017-09-19 (×5): qty 1

## 2017-09-19 MED ORDER — FUROSEMIDE 10 MG/ML IJ SOLN
80.0000 mg | Freq: Once | INTRAMUSCULAR | Status: AC
  Administered 2017-09-19: 80 mg via INTRAVENOUS
  Filled 2017-09-19: qty 8

## 2017-09-19 MED ORDER — SODIUM CHLORIDE 0.9% FLUSH
3.0000 mL | Freq: Two times a day (BID) | INTRAVENOUS | Status: DC
  Administered 2017-09-19 – 2017-09-22 (×7): 3 mL via INTRAVENOUS

## 2017-09-19 MED ORDER — NYSTATIN 100000 UNIT/GM EX POWD
Freq: Three times a day (TID) | CUTANEOUS | Status: DC
Start: 2017-09-19 — End: 2017-09-23
  Administered 2017-09-20 – 2017-09-22 (×9): via TOPICAL
  Filled 2017-09-19: qty 15

## 2017-09-19 MED ORDER — BACITRACIN ZINC 500 UNIT/GM EX OINT
TOPICAL_OINTMENT | Freq: Two times a day (BID) | CUTANEOUS | Status: DC
  Administered 2017-09-20: 1 via TOPICAL
  Administered 2017-09-20 – 2017-09-21 (×3): via TOPICAL
  Administered 2017-09-22: 1 via TOPICAL
  Administered 2017-09-22: 12:00:00 via TOPICAL
  Filled 2017-09-19: qty 28.35

## 2017-09-19 MED ORDER — SODIUM CHLORIDE 0.9 % IV SOLN: 250.0000 mL | INTRAVENOUS | Status: DC | PRN

## 2017-09-19 NOTE — Patient Outreach (Addendum)
[facility]) Care Management [place] Telephone [facility] Coordination  09/19/2017  JIOVANNA FREI Sep 12, 1932 [id number]  [place] Coordination outreach re: Nickisha Hum, 84 y/o female currently active with [facility] (primary [facility] CCM RN, Thea Silversmith); patient had been hospitalized March 7-15, 2019 for palpitations, chest tightness, acute on chronic CHF exacerbation with volume overload; patient was admitted as a result of V-Tach being detected during ICD interrogation at scheduled clinic appointment.  Patient was discharged to SNF [facility]) for rehabilitation; received notification from [facility] 09/17/17 of patient's discharge from SNF on 09/16/17. Patient has history including, but not limited to, CAD with ICD; CHF with EF 20%; home O2 dependency with use of CPAP, morbid obesity.  Patient was discharged home from SNF to senior apartments with home health services in place.  Upon attempt to place transition of care telephone outreach to patient today, noted that she was currently in ED with confirmed UTI and confusion.  Possible/ potential admission plans pending outcome of ED medical screening exam.  Secure communication via EMR sent to [place] and [place], notifying of patient's current ED visit.  Plan:  [facility] RN CM will follow patient's progress while in ED and collaborate with [facility] liaison's for discharge disposition.  Will update patient's primary [facility] RN CM of patient's ED visit/ potential admisison  Oneta Rack, RN, BSN, [place] Care Management  [phone]

## 2017-09-19 NOTE — ED Notes (Addendum)
Bladder scan shows 530mL

## 2017-09-19 NOTE — ED Notes (Signed)
Blood cultures have not been drawn yet. Will start antibiotic after blood cultures drawn by phlebotomy. Patient is a difficult stick.

## 2017-09-19 NOTE — ED Provider Notes (Addendum)
[facility] Provider Note   CSN: [id number] Arrival date & time: 09/19/17  1017     History   Chief Complaint Chief Complaint  Patient presents with  . Urinary Tract Infection    HPI Bianca [name] is a 84 y.o. female.  HPI 84 year old female with extensive past medical history as below here with altered mental status.  According to patient's daughter-in-law, the patient has been increasingly confused over the last 48 hours.  Patient was recently admitted for CHF exacerbation and sent to rehab.  She was sent home from rehab on Friday.  She was reportedly well for several days, but over the last 48 hours has been increasingly confused.  She was just diagnosed with UTI and placed on Macrobid prior to leaving the facility.  She has been increasingly confused, which is very atypical for her.  Family has also noticed that she has had increasing bilateral lower extremity swelling, which is common for her CHF crises.  She is been taking her medications.  Earlier today, the patient was found in her house, with blood everywhere.  She reportedly had questionably injured her hand.  On my assessment, the patient states she feels unwell but otherwise denies any complaints.  She does not recall any falls.  Denies any chest pain.  Denies any abdominal pain.  She does state that she has to go to the bathroom, and has reportedly had increased urinary frequency.  Past Medical History:  Diagnosis Date  . Acute blood loss anemia   . AICD (automatic cardioverter/defibrillator) present   . Arthritis    "back, feet, legs" (08/20/2017)  . Basal cell carcinoma    "cut off scalp; burned off face" (08/20/2017)  . CAD (coronary artery disease)/LBBB 01/01/2017  . CHF (congestive heart failure) ([place])   . Chronic lower back pain   . Chronic systolic heart failure ([place])   . CKD (chronic kidney disease), stage III ([place]) 12/22/2015  . Complication of anesthesia ~ 2015   "quit breathing during  colonoscopy" (08/20/2017)  . Depression 11/24/2015  . Gout    "on daily RX" (08/20/2017)  . High cholesterol   . History of blood transfusion 10/2015   "S/P fall w/right side hematoma"  . Hypertension   . Hypokalemia   . Myocardial infarction ([place]) 07/2006; 11/2006  . Neuromuscular disorder ([place])   . On home oxygen therapy    "2L at night w/CPAP" (08/20/2017)  . OSA on CPAP   . Paroxysmal atrial fibrillation (HCC)   . Peripheral neuropathy   . Physical deconditioning   . Pneumonia    "once" (08/20/2017)  . Presence of permanent cardiac pacemaker   . Right flank hematoma 10/2015    Patient Active Problem List   Diagnosis Date Noted  . AICD (automatic cardioverter/defibrillator) present 09/19/2017  . Acute systolic heart failure ([place]) 09/19/2017  . Acute respiratory failure with hypoxia ([place]) 09/19/2017  . High cholesterol 09/19/2017  . History of sustained ventricular tachycardia 09/19/2017  . Coronary artery disease 09/19/2017  . Acute metabolic encephalopathy 51/07/5850  . Recent urinary tract infection 09/19/2017  . Elevated troponin 09/19/2017  . AF (paroxysmal atrial fibrillation) ([place]) 09/19/2017  . Chronic kidney disease (CKD), stage III (moderate) ([place]) 09/19/2017  . Acute on chronic respiratory failure with hypoxia ([place]) 09/19/2017  . Enterococcus UTI 09/19/2017  . Yeast vaginitis 09/19/2017  . Sepsis ([place]) 09/19/2017  . Laceration of left pinky finger 09/19/2017  . Ischemic cardiomyopathy   . Recurrent UTI 07/30/2017  .  Altered mental status 07/30/2017  . Obstructive sleep apnea on CPAP 07/30/2017  . Yeast dermatitis 07/30/2017  . Hypokalemia 07/30/2017  . Atrial fibrillation, transient ([place]) 07/30/2017  . Arthritis of right knee/neuropathy 07/30/2017  . Class 1 obesity with body mass index (BMI) of 32.0 to 32.9 in adult 07/30/2017  . Dyslipidemia 07/30/2017  . CKD (chronic kidney disease), symptom management only, stage 3 (moderate) ([place]) 07/30/2017  . Hypertension  01/01/2017  . Sleep apnea 01/01/2017  . CAD (coronary artery disease)/LBBB 01/01/2017  . History of ventricular tachycardia 01/01/2017  . PAF (paroxysmal atrial fibrillation) ([place]) 01/01/2017  . CKD (chronic kidney disease) stage 3, GFR 30-59 ml/min (HCC) 01/01/2017  . Peripheral neuropathy 01/01/2017  . OSA on CPAP 01/01/2017  . Obesity (BMI 30-39.9) 01/01/2017  . ICD (implantable cardioverter-defibrillator) in place 01/01/2017  . Dyspnea   . Ventricular tachycardia ([place])   . Subclinical hypothyroidism 12/25/2015  . CKD (chronic kidney disease), stage III ([place]) 12/22/2015  . Hypoxia 12/22/2015  . Hypoxemia 12/21/2015  . CHF exacerbation ([place]) 11/24/2015  . Physical deconditioning 11/24/2015  . Hyperglycemia 11/24/2015  . Peripheral neuropathy 11/24/2015  . HLD (hyperlipidemia) 11/24/2015  . Depression 11/24/2015  . Chest pain 11/24/2015  . Neuropathy involving both lower extremities   . Paroxysmal atrial fibrillation (HCC)   . OSA (obstructive sleep apnea)   . E. coli UTI   . Subcutaneous hematoma   . Fall 10/25/2015  . Afib ([place]) 01/19/2015  . Long-term (current) use of anticoagulants 07/20/2014  . Persistent atrial fibrillation ([place]) 07/20/2014  . Encounter for therapeutic drug monitoring 07/16/2013  . Chronic systolic heart failure ([place]) 07/16/2013  . History of implantable cardioverter-defibrillator (ICD) placement 07/16/2013  . Morbid obesity ([place]) 07/16/2013  . Chronic anticoagulation 07/16/2013  . Knee osteoarthritis 07/16/2013  . HYPOKALEMIA 08/09/2010  . Neuropathy ([place]) 08/09/2010  . Chronic diastolic (congestive) heart failure ([place]) 08/09/2010  . Automatic implantable cardioverter-defibrillator in situ 08/09/2010    Past Surgical History:  Procedure Laterality Date  . ACHILLES TENDON SURGERY Bilateral    "stretched"  . BASAL CELL CARCINOMA EXCISION  2017   "scalp"  . BREAST CYST EXCISION Left 1983  . CARDIAC CATHETERIZATION  07/2006; 08/21/2017  . CATARACT  EXTRACTION, BILATERAL Bilateral   . CHOLECYSTECTOMY OPEN    . CORONARY STENT INTERVENTION N/A 08/20/2017   Procedure: CORONARY STENT INTERVENTION;  Surgeon: Burnell Blanks, MD;  Location: [facility];  Service: Cardiovascular;  Laterality: N/A;  . EP IMPLANTABLE DEVICE N/A 02/29/2016   Procedure: BIV ICD Generator Changeout;  Surgeon: Evans Lance, MD;  Location: [facility];  Service: Cardiovascular;  Laterality: N/A;  . INSERT / REPLACE / REMOVE PACEMAKER  01/2007  . KNEE LIGAMENT RECONSTRUCTION  2012  . RIGHT/LEFT HEART CATH AND CORONARY ANGIOGRAPHY N/A 08/20/2017   Procedure: RIGHT/LEFT HEART CATH AND CORONARY ANGIOGRAPHY;  Surgeon: Jolaine Artist, MD;  Location: [facility];  Service: Cardiovascular;  Laterality: N/A;  . TONSILLECTOMY    . TOTAL ABDOMINAL HYSTERECTOMY       OB History   None      Home Medications    Prior to Admission medications   Medication Sig Start Date End Date Taking? Authorizing Provider  acetaminophen (TYLENOL) 650 MG CR tablet Take 1,300 mg by mouth 2 (two) times daily before a meal.   Yes [provider]  albuterol (PROVENTIL) (2.5 MG/3ML) 0.083% nebulizer solution Take 2.5 mg by nebulization every 6 (six) hours as needed for wheezing or  shortness of breath.   Yes [provider]  allopurinol (ZYLOPRIM) 100 MG tablet Take 100 mg by mouth See admin instructions. Takes along with a 300 mg tablet to equal 400 mg    Yes [provider]  allopurinol (ZYLOPRIM) 300 MG tablet Take 300 mg by mouth See admin instructions. Take with the 100mg  tablet to equal 400mg  daily    Yes [provider]  amiodarone (PACERONE) 200 MG tablet Take 1 tablet (200 mg total) by mouth daily. 09/13/17  Yes Evans Lance, MD  apixaban (ELIQUIS) 5 MG TABS tablet Take 5 mg by mouth 2 (two) times daily.   Yes [provider]  aspirin 81 MG chewable tablet Chew 1 tablet (81 mg total) by mouth daily. 08/22/17  Yes  Clegg, Amy D, NP  atorvastatin (LIPITOR) 40 MG tablet Take 1 tablet (40 mg total) by mouth daily. 08/21/17 08/21/18 Yes Clegg, Amy D, NP  benzocaine-menthol (CHLORASEPTIC) 6-10 MG lozenge Take 1 lozenge by mouth as needed for sore throat.   Yes [provider]  Calcium Carbonate-Vit D-Min (CALCIUM/VITAMIN D/MINERALS) 600-200 MG-UNIT TABS Take 1 tablet by mouth 2 (two) times daily.    Yes [provider]  Cholecalciferol (VITAMIN D3) 1000 UNITS CAPS Take 1,000 Units by mouth daily.    Yes [provider]  clopidogrel (PLAVIX) 75 MG tablet Take 1 tablet (75 mg total) by mouth daily with breakfast. 08/22/17  Yes Clegg, Amy D, NP  colestipol (COLESTID) 1 G tablet Take 2 g by mouth at bedtime.    Yes [provider]  gabapentin (NEURONTIN) 400 MG capsule Take 1 capsule (400 mg total) by mouth 3 (three) times daily. 08/02/17  Yes Sheikh, Omair Latif, DO  guaiFENesin (MUCINEX) 600 MG 12 hr tablet Take 1 tablet (600 mg total) by mouth 2 (two) times daily. 11/29/15  Yes Theodis Blaze, MD  isosorbide mononitrate (IMDUR) 30 MG 24 hr tablet Take 0.5 tablets (15 mg total) by mouth daily. 08/12/17 11/10/17 Yes Isaiah Serge, NP  meclizine (ANTIVERT) 12.5 MG tablet Take 12.5 mg by mouth 3 (three) times daily as needed for dizziness.   Yes [provider]  metolazone (ZAROXOLYN) 2.5 MG tablet Take 2.5 mg by mouth once a week.   Yes [provider]  metoprolol succinate (TOPROL-XL) 25 MG 24 hr tablet Take 1 tablet (25 mg total) by mouth daily. 07/18/17  Yes Jerline Pain, MD  nitrofurantoin, macrocrystal-monohydrate, (MACROBID) 100 MG capsule Take 100 mg by mouth daily. 09/16/17  Yes [provider]  nitroGLYCERIN (NITROSTAT) 0.4 MG SL tablet Place 1 tablet (0.4 mg total) under the tongue every 5 (five) minutes as needed for chest pain. 11/24/12  Yes Jettie Booze, MD  nortriptyline (PAMELOR) 25 MG capsule Take 25 mg by mouth at bedtime.   Yes [provider]  OXYGEN Inhale 2 L/min into the lungs at bedtime as needed (uses as bedtime everynight and then occasionally iun the daytime as needed for SOB).    Yes [provider]  pantoprazole (PROTONIX) 40 MG tablet Take 1 tablet (40 mg total) by mouth daily. 08/30/17 08/30/18 Yes Georgiana Shore, NP  potassium chloride SA (K-DUR,KLOR-CON) 20 MEQ tablet Take 1 tablet (20 mEq total) by mouth 2 (two) times daily. 08/30/17  Yes Georgiana Shore, NP  Probiotic Product (PROBIOTIC PO) Take 1 tablet by mouth daily. Rid-Bid   Yes [provider]  senna-docusate (SENOKOT-S) 8.6-50 MG tablet Take 1 tablet by mouth  2 (two) times daily as needed for mild constipation.    Yes [provider]  spironolactone (ALDACTONE) 25 MG tablet Take 1 tablet (25 mg total) by mouth daily. 08/19/17  Yes Bensimhon, Shaune Pascal, MD  torsemide (DEMADEX) 20 MG tablet Take 80 mg by mouth in the AM and 40 mg by mouth in the PM   Yes [provider]    Family History Family History  Problem Relation Age of Onset  . Stroke Mother   . Congestive Heart Failure Mother   . Tuberculosis Father   . Diabetes Brother   . Stroke Brother   . Cancer Brother   . Heart attack Son     Social History Social History   Tobacco Use  . Smoking status: Former Smoker    Packs/day: 0.12    Years: 4.00    Pack years: 0.48    Types: Cigarettes  . Smokeless tobacco: Never Used  . Tobacco comment: 'quit smoking in the 1970s"  Substance Use Topics  . Alcohol use: No  . Drug use: No     Allergies   Quinapril hcl; Tape; Coreg [carvedilol]; Tessalon [benzonatate]; and Neosporin [neomycin-polymyxin-gramicidin]   Review of Systems Review of Systems  Unable to perform ROS: Mental status change  Constitutional: Positive for fatigue.  Neurological: Positive for weakness.     Physical Exam Updated Vital Signs BP 116/79   Pulse 94   Temp 98.1 F (36.7 C) (Oral)   Resp (!) 26   LMP  (LMP Unknown)    SpO2 97%   Physical Exam  Constitutional: She appears well-developed and well-nourished. No distress.  HENT:  Head: Normocephalic and atraumatic.  Eyes: Conjunctivae are normal.  Neck: Neck supple.  Cardiovascular: Normal rate, regular rhythm and normal heart sounds. Exam reveals no friction rub.  No murmur heard. Pulmonary/Chest: Effort normal and breath sounds normal. No respiratory distress. She has no wheezes. She has no rales.  Abdominal: She exhibits no distension.  Musculoskeletal: She exhibits no edema.  2+ pitting edema bilateral lower extremities  Neurological: She is alert. She exhibits normal muscle tone.  Oriented to person and place but not time.  Moves all extremities.  Face symmetric.  Speech normal.  Tongue protrusion midline.  Skin: Skin is warm. Capillary refill takes less than 2 seconds.  Approximately 0.5 mm superficial laceration to the volar aspect of the left fifth finger.  Nail injury without injury to the underlying nail bed or plate.  Minimal bleeding.  Psychiatric: She has a normal mood and affect.  Nursing note and vitals reviewed.    ED Treatments / Results  Labs (all labs ordered are listed, but only abnormal results are displayed) Labs Reviewed  CBC WITH DIFFERENTIAL/PLATELET - Abnormal; Notable for the following components:      Result Value   Hemoglobin 11.9 (*)    RDW 16.1 (*)    All other components within normal limits  COMPREHENSIVE METABOLIC PANEL - Abnormal; Notable for the following components:   Sodium 134 (*)    Chloride 96 (*)    Glucose, Bld 113 (*)    BUN 41 (*)    Creatinine, Ser 1.56 (*)    Total Bilirubin 1.9 (*)    GFR calc non Af Amer 30 (*)    GFR calc Af Amer 35 (*)    All other components within normal limits  BRAIN NATRIURETIC PEPTIDE - Abnormal; Notable for the following components:   B Natriuretic Peptide 2,673.5 (*)    All  other components within normal limits  TROPONIN I - Abnormal; Notable for the following  components:   Troponin I 0.06 (*)    All other components within normal limits  AMMONIA - Abnormal; Notable for the following components:   Ammonia 40 (*)    All other components within normal limits  TROPONIN I - Abnormal; Notable for the following components:   Troponin I 0.06 (*)    All other components within normal limits  SEDIMENTATION RATE - Abnormal; Notable for the following components:   Sed Rate 24 (*)    All other components within normal limits  CBC WITH DIFFERENTIAL/PLATELET - Abnormal; Notable for the following components:   Hemoglobin 11.6 (*)    RDW 15.9 (*)    All other components within normal limits  PROTIME-INR - Abnormal; Notable for the following components:   Prothrombin Time 21.5 (*)    All other components within normal limits  APTT - Abnormal; Notable for the following components:   aPTT 41 (*)    All other components within normal limits  URINE CULTURE  CULTURE, BLOOD (ROUTINE X 2)  CULTURE, BLOOD (ROUTINE X 2)  MRSA PCR SCREENING  MAGNESIUM  URINALYSIS, ROUTINE W REFLEX MICROSCOPIC  PROCALCITONIN  LACTIC ACID, PLASMA  TROPONIN I  TROPONIN I  LACTIC ACID, PLASMA  BASIC METABOLIC PANEL  I-STAT CG4 LACTIC ACID, ED  I-STAT CG4 LACTIC ACID, ED    EKG EKG Interpretation  Date/Time:  Thursday September 19 2017 10:32:05 EDT Ventricular Rate:  92 PR Interval:    QRS Duration: 190 QT Interval:  464 QTC Calculation: 575 R Axis:   -132 Text Interpretation:  Sinus rhythm Multiple premature complexes, vent & supraven Borderline short PR interval Right bundle branch block No significant change since last tracing Confirmed by Duffy Bruce [phone]) on 09/19/2017 10:35:41 AM   Radiology Dg Chest 2 View  Result Date: 09/19/2017 CLINICAL DATA:  UTI, pt has been receving antibiotic tx x3 days. Per family pt is confused, normally is A+Ox4 and able to hold a conversation, is now oriented x3. Pt has a laceration to right pinky, neither pt nor family able to  recall what caused it, bleeding controlled at this time. Pt is on anticoagulants. Per family pt has an increase in swelling to both legs, has not taken her am meds today. Pt has hx of CHF, has a non-pacemaker. Family at bedside EXAM: CHEST - 2 VIEW COMPARISON:  08/29/2017 FINDINGS: Lungs are clear. Stable moderate cardiomegaly.  Stable left subclavian AICD. No effusion. Anterior vertebral endplate spurring at multiple levels in the mid thoracic spine. IMPRESSION: Stable cardiomegaly.  No acute disease. Electronically Signed   By: Lucrezia Europe M.D.   On: 09/19/2017 10:13   Ct Head Wo Contrast  Result Date: 09/19/2017 CLINICAL DATA:  Altered level of consciousness. Urinary tract infection. EXAM: CT HEAD WITHOUT CONTRAST TECHNIQUE: Contiguous axial images were obtained from the base of the skull through the vertex without intravenous contrast. COMPARISON:  Head CT scan 07/09/2017 and 10/25/2015. FINDINGS: Brain: No evidence of acute infarction, hemorrhage, hydrocephalus, extra-axial collection or mass lesion/mass effect. Cortical atrophy and extensive chronic microvascular ischemic change are noted. Vascular: No hyperdense vessel or unexpected calcification. Skull: Intact. Sinuses/Orbits: Minimal mucosal thickening right sphenoid sinus and small mucous retention cyst or polyp right maxillary sinus noted. Other: None. IMPRESSION: No acute abnormality. Atrophy and chronic microvascular ischemic change. Electronically Signed   By: Inge Rise M.D.   On: 09/19/2017 12:16    Procedures .Critical  Care Performed by: Duffy Bruce, MD Authorized by: Duffy Bruce, MD   Critical care provider statement:    Critical care time (minutes):  35   Critical care time was exclusive of:  Separately billable procedures and treating other patients and teaching time   Critical care was necessary to treat or prevent imminent or life-threatening deterioration of the following conditions:  Circulatory failure, cardiac  failure, sepsis, dehydration and respiratory failure   Critical care was time spent personally by me on the following activities:  Development of treatment plan with patient or surrogate, discussions with consultants, evaluation of patient's response to treatment, examination of patient, obtaining history from patient or surrogate, ordering and performing treatments and interventions, ordering and review of laboratory studies, ordering and review of radiographic studies, pulse oximetry, re-evaluation of patient's condition and review of old charts   I assumed direction of critical care for this patient from another provider in my specialty: no     (including critical care time)  Medications Ordered in ED Medications  nystatin (MYCOSTATIN/NYSTOP) topical powder (has no administration in time range)  sodium chloride flush (NS) 0.9 % injection 3 mL (has no administration in time range)  sodium chloride flush (NS) 0.9 % injection 3 mL (has no administration in time range)  0.9 %  sodium chloride infusion (has no administration in time range)  acetaminophen (TYLENOL) tablet 650 mg (has no administration in time range)  ondansetron (ZOFRAN) injection 4 mg (has no administration in time range)  furosemide (LASIX) injection 80 mg (has no administration in time range)  ampicillin (OMNIPEN) 1 g in sodium chloride 0.9 % 100 mL IVPB (0 g Intravenous Stopped 09/19/17 2014)  amiodarone (PACERONE) tablet 200 mg (has no administration in time range)  apixaban (ELIQUIS) tablet 5 mg (has no administration in time range)  clopidogrel (PLAVIX) tablet 75 mg (has no administration in time range)  clotrimazole (GYNE-LOTRIMIN) vaginal cream 1 Applicatorful (has no administration in time range)  bacitracin ointment (has no administration in time range)  furosemide (LASIX) injection 80 mg (80 mg Intravenous Given 09/19/17 1629)     Initial Impression / Assessment and Plan / ED Course  I have reviewed the triage vital  signs and the nursing notes.  Pertinent labs & imaging results that were available during my care of the patient were reviewed by me and considered in my medical decision making (see chart for details).     84 year old female here with increased confusion in setting of suspected UTI.  Patient also with significant hypervolemia clinically.  Will plan to admit for complicated UTI with delirium as well as CHF.  Patient currently on Macrobid.  Will plan to cover empirically, admit to medicine. UA pending.  UA actually is reassuring. UCx results show enterococcus sensitive to macrobid, vanc. No VRE. Per discussion w/ family, ammonia has caused similar sx in past and pt does have mild asterixis.Will check, plan to admit for AMS, inability to care for self with marked hypervolemia, need for cautious diuresis. Dr. Danelle Earthly is aware, will follow.   Discussed with Dr. Danelle Earthly - recommends IV Lasix 80 mg. Otherwise, pt appears well and in NAD. Suspect delirium, likely multifactorial but possibly worsened 2/2 hypoxia from COPD. Also possible delirium 2/2 UTI, though this appears treated with macrobid and she has no CVAT or signs of pyelo. EKG non-ischemic. No new focal deficits.   Regarding her laceration, it is minimal, <0.5 mm and it is unknown how she injured her hand or when this was  done. Wound cleaned, will tx with pressure dressing, allow to heal via secondary intention. Pt in agreement. Bacitracin to wound. I've placed wound care orders - monitor for signs of infection. No bony ttp or signs of fracture. Denies pain.  Final Clinical Impressions(s) / ED Diagnoses   Final diagnoses:  Acute on chronic congestive heart failure, unspecified heart failure type [facility])  Delirium  Laceration of left little finger without foreign body without damage to nail, initial encounter    ED Discharge Orders    None       Duffy Bruce, MD 09/19/17 2057    Duffy Bruce, MD 09/19/17 [id number]    Duffy Bruce, MD 10/07/17 1523

## 2017-09-19 NOTE — ED Notes (Signed)
[facility] at bedside at this time.

## 2017-09-19 NOTE — ED Notes (Signed)
Patient transported to CT 

## 2017-09-19 NOTE — ED Triage Notes (Signed)
Pt brought in by [facility] from [place] for a confirmed UTI, pt has been receving antibiotic tx x3 days. Per family pt is confused, normally is A+Ox4 and able to hold a conversation, is now oriented x3. Pt has a laceration to right pinky, neither pt nor family able to recall what caused it, bleeding controlled at this time. Pt is on anticoagulants. Per family pt has an increase in swelling to both legs, has not taken her am meds today. Pt has hx of CHF, has a non-pacemaker. Family at bedside.

## 2017-09-19 NOTE — H&P (Signed)
History and Physical    Bianca [name]:[id number] DOB: 1933/03/24 DOA: 09/19/2017  **Will admit patient based on the expectation that the patient will need hospitalization/ hospital care that crosses at least 2 midnights  PCP: Maurice Small, MD   Attending physician: Evangeline Gula  Patient coming from/Resides with: Independent living/[facility] practitioner Complaint: Altered mental status, excessive bleeding from laceration left pinky finger, increased weight and increased lower extremity edema  HPI: Bianca [name] is a 84 y.o. female with medical history significant for new diagnosis of CAD with recent PCI to LAD March 1624, systolic heart failure with an EF of 25%, history of ICD/pacer with Optivol capabilities, encephalopathy secondary to hyper ammonia in the context of passive hepatic congestion from heart failure, atrial fibrillation with recent issues with rhythm control on Eliquis, recurrent UTI, history of V. tach, stage III chronic kidney disease and dyslipidemia.  Patient has recently been discharged from skilled nursing facility last week and has returned to her independent living.  While she was the SNF she began exhibiting signs of UTI so urinalysis and culture was obtained but since unclear as to causative organism initiation of antibiotic therapy was delayed until culture results available about 2 days ago.  Subsequent culture positive for multidrug-resistant enterococcus sensitive to ampicillin, Macrobid, linezolid and vancomycin.  Patient has taken at least 2 doses of Macrobid.  Today the family was called to the independent living facility because patient had apparently injured herself and there was a significant amount of blood noted by staff.  Upon family arrival confirmed patient with small laceration to pinky finger with significant bleeding noted at that time-patient is on Eliquis.  Family reports large pool of blood on patient's bed as well as blood splattered all over the  room as well as bloody hand cramps on the walls and doors.  Patient also was confused and not at her baseline mentation.  Family noted that patient also had increasing pedal and lower extremity edema.  Daughter states that past 2 weights have been 206 and 209 pounds.  Because of all of the above patient was brought to the ER for further evaluation and treatment.  In the ER patient's blood pressures were normal for her with systolic ranges in the low 100s.  She was confirmed with increasing lower extremity edema.  Room air saturations have dipped as low as 46% prompting application of nasal cannula oxygen.  Chest x-ray was unremarkable but BNP was elevated at 2673.  She also had subtle elevation in troponin at 0.06.  Because of her significant cardiac history cardiology was consulted and agreed that patient likely has acute heart failure exacerbation and have given a dose of Lasix 80 mg IV x1.  In addition patient had borderline acute kidney injury with baseline creatinine rising from 1.19 to 1.56.  Patient apparently has issues with chronic urinary incontinence.  She had difficulty voiding while here and a bladder scan was performed that revealed 528 cc of urine so went in and out catheterization was performed and subsequently Purewick urinary collection device was applied by the nursing staff.  Patient has remained somewhat lethargic despite improving her O2 saturations and given history of metabolic encephalopathy in the context of elevated ammonia levels in the past ammonia level has been obtained but not yet resulted.  I have also reviewed the urine culture results from previous nursing facility with sensitivities and have discussed with pharmacy plan is to institute IV ampicillin.  Blood cultures were also be collected  given concerns of a possible sepsis physiology evolving.  Current lactate is normal and as noted patient's blood pressure is at baseline for her.  ED Course:  Vital Signs: BP 104/68   Pulse  89   Temp 98.1 F (36.7 C) (Oral)   Resp 18   LMP  (LMP Unknown)   SpO2 97%  Chest x-ray: Neg CT head: No acute abnormality Lab data: Sodium 134, potassium 4.4, chloride 96, CO2 23, glucose 113, BUN 41, creatinine 1.56, LFTs normal except for mildly elevated total bilirubin 1.9, BNP 2674, troponin 0.06, lactic acid 1.89, white count 7400 with normal differential noting patient currently on Macrobid, hemoglobin 11.9, platelets 246,000, urinalysis unremarkable noting patient currently on Macrobid, repeat urine culture pending from ER Medications and treatments: Lasix 80 mg IV x1  Review of Systems:  In addition to the HPI above,  No Fever-chills, myalgias or other constitutional symptoms No Headache, changes with Vision or hearing, new focal weakness, tingling, numbness in any extremity, dizziness, dysarthria or word finding difficulty, gait disturbance or imbalance, tremors or seizure activity No problems swallowing food or Liquids, indigestion/reflux, choking or coughing while eating, abdominal pain with or after eating No Chest pain, Cough, palpitations, DOE No Abdominal pain, N/V, melena,hematochezia, dark tarry stools, constipation No dysuria, malodorous urine, hematuria or flank pain No new skin rashes, lesions, masses or bruises, No new joint pains, aches, swelling or redness No recent unintentional weight loss No polyuria, polydypsia or polyphagia   Past Medical History:  Diagnosis Date  . Acute blood loss anemia   . AICD (automatic cardioverter/defibrillator) present   . Arthritis    "back, feet, legs" (08/20/2017)  . Basal cell carcinoma    "cut off scalp; burned off face" (08/20/2017)  . CAD (coronary artery disease)/LBBB 01/01/2017  . CHF (congestive heart failure) ([place])   . Chronic lower back pain   . Chronic systolic heart failure ([place])   . CKD (chronic kidney disease), stage III ([place]) 12/22/2015  . Complication of anesthesia ~ 2015   "quit breathing during colonoscopy"  (08/20/2017)  . Depression 11/24/2015  . Gout    "on daily RX" (08/20/2017)  . High cholesterol   . History of blood transfusion 10/2015   "S/P fall w/right side hematoma"  . Hypertension   . Hypokalemia   . Myocardial infarction ([place]) 07/2006; 11/2006  . Neuromuscular disorder ([place])   . On home oxygen therapy    "2L at night w/CPAP" (08/20/2017)  . OSA on CPAP   . Paroxysmal atrial fibrillation ([facility])   . Peripheral neuropathy   . Physical deconditioning   . Pneumonia    "once" (08/20/2017)  . Presence of permanent cardiac pacemaker   . Right flank hematoma 10/2015    Past Surgical History:  Procedure Laterality Date  . ACHILLES TENDON SURGERY Bilateral    "stretched"  . BASAL CELL CARCINOMA EXCISION  2017   "scalp"  . BREAST CYST EXCISION Left 1983  . CARDIAC CATHETERIZATION  07/2006; 08/21/2017  . CATARACT EXTRACTION, BILATERAL Bilateral   . CHOLECYSTECTOMY OPEN    . CORONARY STENT INTERVENTION N/A 08/20/2017   Procedure: CORONARY STENT INTERVENTION;  Surgeon: Burnell Blanks, MD;  Location: [facility];  Service: Cardiovascular;  Laterality: N/A;  . EP IMPLANTABLE DEVICE N/A 02/29/2016   Procedure: BIV ICD Generator Changeout;  Surgeon: Evans Lance, MD;  Location: [facility];  Service: Cardiovascular;  Laterality: N/A;  . INSERT / REPLACE / REMOVE PACEMAKER  01/2007  . KNEE  LIGAMENT RECONSTRUCTION  2012  . RIGHT/LEFT HEART CATH AND CORONARY ANGIOGRAPHY N/A 08/20/2017   Procedure: RIGHT/LEFT HEART CATH AND CORONARY ANGIOGRAPHY;  Surgeon: Jolaine Artist, MD;  Location: [facility];  Service: Cardiovascular;  Laterality: N/A;  . TONSILLECTOMY    . TOTAL ABDOMINAL HYSTERECTOMY      Social History   Socioeconomic History  . Marital status: Widowed    Spouse name: Not on file  . Number of children: Not on file  . Years of education: Not on file  . Highest education level: Not on file  Occupational History  . Not on file  Social Needs  .  Financial resource strain: Not on file  . Food insecurity:    Worry: Not on file    Inability: Not on file  . Transportation needs:    Medical: Not on file    Non-medical: Not on file  Tobacco Use  . Smoking status: Former Smoker    Packs/day: 0.12    Years: 4.00    Pack years: 0.48    Types: Cigarettes  . Smokeless tobacco: Never Used  . Tobacco comment: 'quit smoking in the 1970s"  Substance and Sexual Activity  . Alcohol use: No  . Drug use: No  . Sexual activity: Not Currently  Lifestyle  . Physical activity:    Days per week: Not on file    Minutes per session: Not on file  . Stress: Not on file  Relationships  . Social connections:    Talks on phone: Not on file    Gets together: Not on file    Attends religious service: Not on file    Active member of club or organization: Not on file    Attends meetings of clubs or organizations: Not on file    Relationship status: Not on file  . Intimate partner violence:    Fear of current or ex partner: Not on file    Emotionally abused: Not on file    Physically abused: Not on file    Forced sexual activity: Not on file  Other Topics Concern  . Not on file  Social History Narrative  . Not on file    Mobility: Rolling walker Work history: Not obtained   Allergies  Allergen Reactions  . Quinapril Hcl Swelling    Tongue and throat  . Tape Other (See Comments)    Plastic tape causes irritation.   . Coreg [Carvedilol]     Very sleepy   . Tessalon [Benzonatate] Swelling and Other (See Comments)    Capsule opened in mouth, not an allergy  . Neosporin [Neomycin-Polymyxin-Gramicidin] Rash    Family History  Problem Relation Age of Onset  . Stroke Mother   . Congestive Heart Failure Mother   . Tuberculosis Father   . Diabetes Brother   . Stroke Brother   . Cancer Brother   . Heart attack Son      Prior to Admission medications   Medication Sig Start Date End Date Taking? Authorizing Provider  acetaminophen  (TYLENOL) 650 MG CR tablet Take 1,300 mg by mouth 2 (two) times daily before a meal.    [provider]  allopurinol (ZYLOPRIM) 100 MG tablet Take 100 mg by mouth See admin instructions. Takes along with a 300 mg tablet to equal 400 mg     [provider]  allopurinol (ZYLOPRIM) 300 MG tablet Take 300 mg by mouth See admin instructions. Take with the 132m tablet to equal 4027mdaily  [provider]  amiodarone (PACERONE) 200 MG tablet Take 1 tablet (200 mg total) by mouth daily. 09/13/17   Evans Lance, MD  apixaban (ELIQUIS) 5 MG TABS tablet Take 5 mg by mouth 2 (two) times daily.    [provider]  aspirin 81 MG chewable tablet Chew 1 tablet (81 mg total) by mouth daily. Patient not taking: Reported on 09/18/2017 08/22/17   Darrick Grinder D, NP  atorvastatin (LIPITOR) 40 MG tablet Take 1 tablet (40 mg total) by mouth daily. 08/21/17 08/21/18  Clegg, Amy D, NP  Calcium Carbonate-Vit D-Min (CALCIUM/VITAMIN D/MINERALS) 600-200 MG-UNIT TABS Take 1 tablet by mouth 2 (two) times daily.     [provider]  Cholecalciferol (VITAMIN D3) 1000 UNITS CAPS Take 1,000 Units by mouth daily.     [provider]  clopidogrel (PLAVIX) 75 MG tablet Take 1 tablet (75 mg total) by mouth daily with breakfast. 08/22/17   Clegg, Amy D, NP  colestipol (COLESTID) 1 G tablet Take 2 g by mouth at bedtime.     [provider]  gabapentin (NEURONTIN) 400 MG capsule Take 1 capsule (400 mg total) by mouth 3 (three) times daily. 08/02/17   Raiford Noble Latif, DO  guaiFENesin (MUCINEX) 600 MG 12 hr tablet Take 1 tablet (600 mg total) by mouth 2 (two) times daily. 11/29/15   Theodis Blaze, MD  isosorbide mononitrate (IMDUR) 30 MG 24 hr tablet Take 0.5 tablets (15 mg total) by mouth daily. 08/12/17 11/10/17  Isaiah Serge, NP  metolazone (ZAROXOLYN) 2.5 MG tablet Take 2.5 mg by mouth once a week.    [provider]  metoprolol succinate (TOPROL-XL) 25 MG 24 hr tablet  Take 1 tablet (25 mg total) by mouth daily. 07/18/17   Jerline Pain, MD  nitroGLYCERIN (NITROSTAT) 0.4 MG SL tablet Place 1 tablet (0.4 mg total) under the tongue every 5 (five) minutes as needed for chest pain. 11/24/12   Jettie Booze, MD  nortriptyline (PAMELOR) 25 MG capsule Take 25 mg by mouth at bedtime.    [provider]  OXYGEN Inhale 2 L/min into the lungs at bedtime as needed (uses as bedtime everynight and then occasionally iun the daytime as needed for SOB).     [provider]  pantoprazole (PROTONIX) 40 MG tablet Take 1 tablet (40 mg total) by mouth daily. 08/30/17 08/30/18  Georgiana Shore, NP  potassium chloride SA (K-DUR,KLOR-CON) 20 MEQ tablet Take 1 tablet (20 mEq total) by mouth 2 (two) times daily. Patient taking differently: Take 40 mEq by mouth 2 (two) times daily.  08/30/17   Georgiana Shore, NP  Probiotic Product (PROBIOTIC PO) Take 1 tablet by mouth daily.    [provider]  senna-docusate (SENOKOT-S) 8.6-50 MG tablet Take 1 tablet by mouth 2 (two) times daily as needed for mild constipation.     [provider]  spironolactone (ALDACTONE) 25 MG tablet Take 1 tablet (25 mg total) by mouth daily. Patient taking differently: Take 12.5 mg by mouth daily.  08/19/17   Bensimhon, Shaune Pascal, MD  torsemide (DEMADEX) 20 MG tablet Take 80 mg by mouth in the AM and 40 mg by mouth in the PM    [provider]    Physical Exam: Vitals:   09/19/17 1430 09/19/17 1500 09/19/17 1530 09/19/17 1600  BP: 102/70 101/77 102/79 104/68  Pulse: 90 89 88 89  Resp: (!) 23 11 14 18   Temp:  TempSrc:      SpO2: 97% 97% 96% 97%      Constitutional: Bianca [name] but will awaken and attempt to communicate but seems confused Eyes: PERRL, lids and conjunctivae normal ENMT: Mucous membranes are moist. Posterior pharynx clear of any exudate or lesions.Normal dentition.  Neck: normal, supple, no masses, no thyromegaly Respiratory: clear to auscultation  bilaterally, no wheezing, no crackles. Normal respiratory effort. No accessory muscle use.  2 L Cardiovascular: Irregular rate with underlying atrial fibrillation rhythm, no murmurs / rubs / gallops.  Soft bilateral lower extremity edema with most notable bilateral pedal edema. 1+ pedal pulses. No carotid bruits.  Abdomen: no tenderness, no masses palpated. No hepatosplenomegaly. Bowel sounds positive. Genitourinary: Purewick urinary collection device in place-mons and groin/medial thighs with evidence of yeast dermatitis Musculoskeletal: no clubbing / cyanosis. No joint deformity upper and lower extremities. Good ROM, no contractures. Normal muscle tone.  Skin: no rashes, lesions, ulcers. No induration Neurologic: CN 2-12 grossly intact. Sensation intact, DTR normal. Strength 4/5 x all 4 extremities.  Psychiatric: Lethargic but will awaken and briefly answer questions, oriented times name.   Labs on Admission: I have personally reviewed following labs and imaging studies  CBC: Recent Labs  Lab 09/19/17 1038  WBC 7.4  NEUTROABS 4.5  HGB 11.9*  HCT 37.4  MCV 91.9  PLT 161   Basic Metabolic Panel: Recent Labs  Lab 09/16/17 1108 09/19/17 1038  NA 137 134*  K 2.8* 4.4  CL 95* 96*  CO2 29 23  GLUCOSE 97 113*  BUN 37* 41*  CREATININE 1.19* 1.56*  CALCIUM 9.5 9.7  MG  --  1.9   GFR: Estimated Creatinine Clearance: 32 mL/min (A) (by C-G formula based on SCr of 1.56 mg/dL (H)). Liver Function Tests: Recent Labs  Lab 09/16/17 1108 09/19/17 1038  AST 24 29  ALT 13* 16  ALKPHOS 79 77  BILITOT 1.5* 1.9*  PROT 7.5 7.1  ALBUMIN 3.8 3.9   No results for input(s): LIPASE, AMYLASE in the last 168 hours. Recent Labs  Lab 09/16/17 1109  AMMONIA 52*   Coagulation Profile: No results for input(s): INR, PROTIME in the last 168 hours. Cardiac Enzymes: Recent Labs  Lab 09/19/17 1038  TROPONINI 0.06*   BNP (last 3 results) No results for input(s): PROBNP in the last 8760  hours. HbA1C: No results for input(s): HGBA1C in the last 72 hours. CBG: No results for input(s): GLUCAP in the last 168 hours. Lipid Profile: No results for input(s): CHOL, HDL, LDLCALC, TRIG, CHOLHDL, LDLDIRECT in the last 72 hours. Thyroid Function Tests: No results for input(s): TSH, T4TOTAL, FREET4, T3FREE, THYROIDAB in the last 72 hours. Anemia Panel: No results for input(s): VITAMINB12, FOLATE, FERRITIN, TIBC, IRON, RETICCTPCT in the last 72 hours. Urine analysis:    Component Value Date/Time   COLORURINE YELLOW 09/19/2017 [place] 09/19/2017 0947   LABSPEC 1.013 09/19/2017 0947   PHURINE 5.0 09/19/2017 0947   GLUCOSEU NEGATIVE 09/19/2017 0947   HGBUR NEGATIVE 09/19/2017 0947   BILIRUBINUR NEGATIVE 09/19/2017 0947   KETONESUR NEGATIVE 09/19/2017 0947   PROTEINUR NEGATIVE 09/19/2017 0947   UROBILINOGEN 0.2 11/21/2012 1750   NITRITE NEGATIVE 09/19/2017 0947   LEUKOCYTESUR NEGATIVE 09/19/2017 0947   Sepsis Labs: @LABRCNTIP (procalcitonin:4,lacticidven:4) )No results found for this or any previous visit (from the past 240 hour(s)).   Radiological Exams on Admission: Dg Chest 2 View  Result Date: 09/19/2017 CLINICAL DATA:  UTI, pt has been receving antibiotic tx x3 days. Per family  pt is confused, normally is A+Ox4 and able to hold a conversation, is now oriented x3. Pt has a laceration to right pinky, neither pt nor family able to recall what caused it, bleeding controlled at this time. Pt is on anticoagulants. Per family pt has an increase in swelling to both legs, has not taken her am meds today. Pt has hx of CHF, has a non-pacemaker. Family at bedside EXAM: CHEST - 2 VIEW COMPARISON:  08/29/2017 FINDINGS: Lungs are clear. Stable moderate cardiomegaly.  Stable left subclavian AICD. No effusion. Anterior vertebral endplate spurring at multiple levels in the mid thoracic spine. IMPRESSION: Stable cardiomegaly.  No acute disease. Electronically Signed   By: Lucrezia Europe M.D.   On: 09/19/2017 10:13   Ct Head Wo Contrast  Result Date: 09/19/2017 CLINICAL DATA:  Altered level of consciousness. Urinary tract infection. EXAM: CT HEAD WITHOUT CONTRAST TECHNIQUE: Contiguous axial images were obtained from the base of the skull through the vertex without intravenous contrast. COMPARISON:  Head CT scan 07/09/2017 and 10/25/2015. FINDINGS: Brain: No evidence of acute infarction, hemorrhage, hydrocephalus, extra-axial collection or mass lesion/mass effect. Cortical atrophy and extensive chronic microvascular ischemic change are noted. Vascular: No hyperdense vessel or unexpected calcification. Skull: Intact. Sinuses/Orbits: Minimal mucosal thickening right sphenoid sinus and small mucous retention cyst or polyp right maxillary sinus noted. Other: None. IMPRESSION: No acute abnormality. Atrophy and chronic microvascular ischemic change. Electronically Signed   By: Inge Rise M.D.   On: 09/19/2017 12:16    EKG: (Independently reviewed) sinus rhythm with ventricular rate 92 bpm, underlying right bundle branch block with associated prolonged QTC 575 ms, ?  Ventricular pacing with pacer spikes best detected in the V leads  Assessment/Plan Principal Problem:   Acute on chronic respiratory failure with hypoxia/Acute systolic heart failure  -Patient presents with altered mentation, hypoxemia and increasing lower extremity edema.  Chest x-ray negative but patient's weight is up by at least 7 pounds and BNP is markedly elevated concerning for heart failure exacerbation -Cardiology has evaluated the patient and agrees likely heart failure exacerbation-IV Lasix initiated currently 80 mg IV every 12 hours first dose by cardiology with additional doses planned -Daily weights, strict I's/O -Weight has trended from 206 lbs to 209 lbs to 213 lbs over the past week -Echocardiogram completed November 2018: EF 20% with severe left ventricular dilatation, grade 2 diastolic dysfunction  and diffuse hypokinesis, no mitral regurgitation -R & L heart catheterization March 2019: Arms mild pulmonary hypertension-PCWP= 21-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 -Defer repeating echo this admission to cardiology team -No ARB/ACE I secondary to allergy -With aggressive diuresis and acute exacerbation holding beta-blocker -Defer initiation of Aldactone to cardiology  Active Problems:   Coronary artery disease/Elevated troponin -Currently chest pain-free -Mild elevation in troponin likely secondary to demand ischemia in context of hypoxemia and acute heart failure exacerbation -Cycle troponin -No longer on aspirin since requiring Eliquis -Continue Plavix if patient able to remain awake enough to swallow safely    Chronic kidney disease (CKD), stage III (moderate)  -Baseline renal function at 1.19 -Current creatinine is 1.56 which does not meet criteria for acute kidney injury but certainly reflects decreased renal perfusion in the context of acute systolic heart failure exacerbation and suspected evolving sepsis physiology -Avoid nephrotoxic medications if possible    Acute metabolic encephalopathy -Patient with history of hyperammonia in the past leading to altered mentation -Ammonia level pending -Augmentation could be related to early sepsis physiology -Neurological checks every 2 hours -  NPO until more alert-nursing to check to see if patient can at a minimum swallow pills safely to avoid transitioning Eliquis to IV heparin    ?  Early sepsis/recurrent urinary tract infection/MDR Enterococcus UTI -Urine culture from previous skilled nursing facility brought to hospital by family; organism IDed is enterococcus with sensitivities consistent with multi-drug-resistant organism noting sensitivity to ampicillin, Macrobid, linezolid, and vancomycin -Discussed with pharmacist and will utilize ampicillin -Obtain blood cultures -Patient has altered mentation  and mild evolving acute kidney injury therefore appears to have evolving sepsis physiology -Current urinalysis unremarkable but patient has received at least 2 days of Macrobid prior to arrival -Urine culture pending -Initial lactate normal but will continue to cycle -Pro-calcitonin and ESR    AF (paroxysmal atrial fibrillation)  -Currently rate controlled -Try to continue oral amiodarone if patient able to swallow; has prolonged clearance but if patient remains too lethargic to take oral medications over 48 hours may need to transition to infusion -As noted above will attempt to try oral Eliquis-if patient unable to swallow will need to transition to IV heparin -Being beta-blocker as above -CHA2DS2-VASc=6    Laceration of left pinky finger  -Unknown how occurred-current hemostasis has been achieved -Initial hemoglobin stable but will repeat now and again in a.m. given family description of large amount of blood loss prior to arrival    AICD (automatic cardioverter/defibrillator) present/ History of sustained ventricular tachycardia -Amiodarone as above    High cholesterol -Holding statin given altered mentation    Yeast vaginitis -Begin intravaginal and topical antifungal -Manage urinary incontinence-patient did have transient episode of urinary retention and may require placement of Foley catheter    **Additional lab, imaging and/or diagnostic evaluation at discretion of supervising physician  DVT prophylaxis: Eliquis if patient alert enough to swallow otherwise transition to IV heparin Code Status: Full Family Communication: Daughters at bedside Disposition Plan: Independent living Consults called: Cardiology/Bensimhon    Bianca [name] [facility] Pager [phone]   If 7PM-7AM, please contact night-coverage [email or web address] Password [facility]  09/19/2017, 4:28 PM

## 2017-09-19 NOTE — ED Notes (Signed)
Phlebotomy at bedside this time.

## 2017-09-20 DIAGNOSIS — J9601 Acute respiratory failure with hypoxia: Secondary | ICD-10-CM

## 2017-09-20 DIAGNOSIS — I472 Ventricular tachycardia: Secondary | ICD-10-CM

## 2017-09-20 LAB — BASIC METABOLIC PANEL
Anion gap: 16 — ABNORMAL HIGH (ref 5–15)
Anion gap: 12 (ref 5–15)
BUN: 34 mg/dL — AB (ref 6–20)
BUN: 32 mg/dL — ABNORMAL HIGH (ref 6–20)
CHLORIDE: 96 mmol/L — AB (ref 101–111)
CHLORIDE: 99 mmol/L — AB (ref 101–111)
CO2: 27 mmol/L (ref 22–32)
CO2: 27 mmol/L (ref 22–32)
CREATININE: 1.17 mg/dL — AB (ref 0.44–1.00)
Calcium: 9.1 mg/dL (ref 8.9–10.3)
Calcium: 9 mg/dL (ref 8.9–10.3)
Creatinine, Ser: 1.19 mg/dL — ABNORMAL HIGH (ref 0.44–1.00)
GFR calc Af Amer: 49 mL/min — ABNORMAL LOW (ref >60)
GFR calc Af Amer: 48 mL/min — ABNORMAL LOW (ref >60)
GFR calc non Af Amer: 42 mL/min — ABNORMAL LOW (ref >60)
GFR calc non Af Amer: 41 mL/min — ABNORMAL LOW (ref >60)
GLUCOSE: 91 mg/dL (ref 65–99)
GLUCOSE: 122 mg/dL — AB (ref 65–99)
POTASSIUM: 3.1 mmol/L — AB (ref 3.5–5.1)
POTASSIUM: 3.2 mmol/L — AB (ref 3.5–5.1)
Sodium: 139 mmol/L (ref 135–145)
Sodium: 138 mmol/L (ref 135–145)

## 2017-09-20 LAB — TROPONIN I: TROPONIN I: 0.06 ng/mL — AB (ref <0.03)

## 2017-09-20 LAB — URINE CULTURE
CULTURE: NO GROWTH
SPECIAL REQUESTS: NORMAL

## 2017-09-20 LAB — MAGNESIUM: Magnesium: 2.4 mg/dL (ref 1.7–2.4)

## 2017-09-20 LAB — GLUCOSE, CAPILLARY: Glucose-Capillary: 94 mg/dL (ref 65–99)

## 2017-09-20 MED ORDER — POTASSIUM CHLORIDE CRYS ER 20 MEQ PO TBCR
40.0000 meq | EXTENDED_RELEASE_TABLET | Freq: Once | ORAL | Status: AC
  Administered 2017-09-20: 40 meq via ORAL
  Filled 2017-09-20: qty 2

## 2017-09-20 MED ORDER — SPIRONOLACTONE 25 MG PO TABS
25.0000 mg | ORAL_TABLET | Freq: Every day | ORAL | Status: DC
  Administered 2017-09-20 – 2017-09-22 (×3): 25 mg via ORAL
  Filled 2017-09-20 (×3): qty 1

## 2017-09-20 MED ORDER — AMIODARONE HCL 200 MG PO TABS
200.0000 mg | ORAL_TABLET | Freq: Two times a day (BID) | ORAL | Status: DC
  Administered 2017-09-20 – 2017-09-21 (×2): 200 mg via ORAL
  Filled 2017-09-20 (×2): qty 1

## 2017-09-20 MED ORDER — POTASSIUM CHLORIDE CRYS ER 20 MEQ PO TBCR
40.0000 meq | EXTENDED_RELEASE_TABLET | ORAL | Status: AC
  Administered 2017-09-20: 40 meq via ORAL
  Filled 2017-09-20: qty 2

## 2017-09-20 MED ORDER — METOPROLOL SUCCINATE ER 25 MG PO TB24
25.0000 mg | ORAL_TABLET | Freq: Every day | ORAL | Status: DC
  Administered 2017-09-20 – 2017-09-22 (×3): 25 mg via ORAL
  Filled 2017-09-20 (×3): qty 1

## 2017-09-20 MED ORDER — MAGNESIUM SULFATE 2 GM/50ML IV SOLN
2.0000 g | Freq: Once | INTRAVENOUS | Status: AC
  Administered 2017-09-20: 2 g via INTRAVENOUS
  Filled 2017-09-20: qty 50

## 2017-09-20 NOTE — Progress Notes (Signed)
pt c/o full bladder. bladder scanned showed 748. Pt has attempted to urinate several times and unsuccessful. Md paged, awaiting a response
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
## 2017-09-20 NOTE — Consult Note (Signed)
   [facility] Inpatient Consult   09/20/2017  Bianca [name] Nov 05, 1932 [id number]     Bianca [name] is active with [facility] program. Please see chart review tab then encounters for patient outreach details.   Went to bedside to speak with Bianca [name]. No family at bedside.   Will continue to follow and update [facility] team.  Left voicemail message for (covering) inpatient RNCM to make aware [facility] is active.   Bianca Rolling, MSN-Ed, RN,BSN [facility] Liaison [phone]

## 2017-09-20 NOTE — Progress Notes (Signed)
Notified by Nate RN of pt with runs of NSVT.  Upon arrival, pt was resting in bed, NAD, denies CP and SOB.  HR v-paced 80s, BP 111/81 with sats 98% on RA.  Nate notified primary svc of events and orders.  Attempting to obtain 12 lead EKG when VT occurs.  Pt has AICD with ATP. Appears that ATP is overriding and slowing back to paced rhythm.No shock has been delivered to our knowledge at this time. Magnesium ordered and given.  Addendum: Pt having recurrent frequent runs of NSVT.  Potassium (3.1) ordered for replacement.  Nate RN communicating with Dr. Tamala Julian [facility] for further orders.

## 2017-09-20 NOTE — Progress Notes (Signed)
Patient ID: Bianca [name], female   DOB: 28-Apr-1933, 84 y.o.   MRN: [id number]  PROGRESS NOTE    Bianca [name]  NOB:[id number] DOB: [birth year] DOA: 09/19/2017 PCP: Maurice Small, MD   Outpatient Specialists: Dr Crissie Sickles, Cardiology   Brief Narrative:  This is an 84 year old female with extensive cardiac history including chronic systolic dysfunction with EF of 25% recent AICD/pacemaker placement with PCI to LAD in March of this year admitted with altered mental status as well as acute on chronic respiratory failure. Patient also has had hyperammonemia and recurrent UTIs with current enterococcal UTI diagnosed from her facility. She was admitted with acute on chronic respiratory failure secondary to fluid overload most likely CHF. Also acute on chronic kidney disease. Patient has persistent recurrent nonsustained V. Tach.  Assessment & Plan:   Principal Problem:   Acute respiratory failure with hypoxia (HCC) Active Problems:   AICD (automatic cardioverter/defibrillator) present   Acute systolic heart failure (HCC)   High cholesterol   History of sustained ventricular tachycardia   Coronary artery disease   Acute metabolic encephalopathy   Recent urinary tract infection   Elevated troponin   AF (paroxysmal atrial fibrillation) (HCC)   Chronic kidney disease (CKD), stage III (moderate) (HCC)   Acute on chronic respiratory failure with hypoxia (HCC)   Enterococcus UTI   Yeast vaginitis   Sepsis ([place])   Laceration of left pinky finger   #1 acute on chronic respiratory failure with hypoxia: Oxygen sats improved on oxygen at the moment. Patient is resting comfortably. Continue oxygen at 2 L/m and titrate. Continue diuresis.  #2 acute on chronic systolic dysfunction CHF: Patient is responding to diuresis at the moment. About 1.5 L negative since admission. We will follow weight daily.  #3 nonsustained V. tach: Patient had multiple episode of nonsustained V. tach this morning. It  appears had AICD fired. Potassium and magnesium were given and repletion is ongoing. Recheck potassium and magnesium this afternoon. We will get cardiology involvement.  #4 altered mental status: Suspected hepatic encephalopathy from hepatic congestion. Recheck ammonia level if elevated give lactulose. Patient is arousable in this awake at the moment  #5 laceration of the left finger: Patient had bleeding at the facility from her injury. H&H appears stable at the moment. We'll continue to monitor.  #6 enterococcal UTI: This is multidrug resistant but sensitive to ampicillin. Continue ampicillin IV  #7 mild elevated troponin: Probably secondary to CHF. Troponins are flat.  #8 hypokalemia: Potassium is 3.1 this morning. Continue to replete potassium and recheck at noon.   DVT prophylaxis:  Code Status:  Family Communication: Daughter at bedside Disposition Plan: Back to independent living Consultants:   Cardiology  Procedures: None  Antimicrobials: IV ampicillin 4/4>>>  Subjective: Patient is awake and arousable. No complaint this morning. She had multiple runs of V. tach but patient stated she did not feel it. Daughter is at bedside with her  Objective: Vitals:   09/20/17 0106 09/20/17 0314 09/20/17 0328 09/20/17 0715  BP: 115/70  111/81 110/79  Pulse: 94  97 90  Resp: 18  18 18   Temp: 99.1 F (37.3 C)  98.2 F (36.8 C) (!) 97.3 F (36.3 C)  TempSrc: Oral  Oral Axillary  SpO2: 97%  99% 91%  Weight:  95.7 kg (210 lb 15.7 oz)    Height:        Intake/Output Summary (Last 24 hours) at 09/20/2017 0816 Last data filed at 09/19/2017 2341 Gross per 24 hour  Intake  220 ml  Output 1170 ml  Net -950 ml   Filed Weights   09/19/17 2030 09/20/17 0314  Weight: 96.7 kg (213 lb 3 oz) 95.7 kg (210 lb 15.7 oz)    Examination:  General exam: Appears calm and comfortable  Respiratory system: Decreased AE with mild Rhonchi Respiratory effort normal. Cardiovascular system:  Irregular, 3/5 Systolic Murmur, 1+ pedal edema. Gastrointestinal system: Abdomen is nondistended, soft and nontender. No organomegaly or masses felt. Normal bowel sounds heard. Central nervous system: Alert and oriented. No focal neurological deficits. Extremities: Symmetric 5 x 5 power. Skin: No rashes, lesions or ulcers Psychiatry: Judgement and insight appear normal. Mood & affect appropriate.     Data Reviewed: I have personally reviewed following labs and imaging studies  CBC: Recent Labs  Lab 09/19/17 1038 09/19/17 1732  WBC 7.4 7.7  NEUTROABS 4.5 4.2  HGB 11.9* 11.6*  HCT 37.4 37.4  MCV 91.9 91.9  PLT 246 268   Basic Metabolic Panel: Recent Labs  Lab 09/16/17 1108 09/19/17 1038 09/20/17 0428  NA 137 134* 139  K 2.8* 4.4 3.1*  CL 95* 96* 96*  CO2 29 23 27   GLUCOSE 97 113* 91  BUN 37* 41* 34*  CREATININE 1.19* 1.56* 1.17*  CALCIUM 9.5 9.7 9.1  MG  --  1.9  --    GFR: Estimated Creatinine Clearance: 43.2 mL/min (A) (by C-G formula based on SCr of 1.17 mg/dL (H)). Liver Function Tests: Recent Labs  Lab 09/16/17 1108 09/19/17 1038  AST 24 29  ALT 13* 16  ALKPHOS 79 77  BILITOT 1.5* 1.9*  PROT 7.5 7.1  ALBUMIN 3.8 3.9   No results for input(s): LIPASE, AMYLASE in the last 168 hours. Recent Labs  Lab 09/16/17 1109 09/19/17 1732  AMMONIA 52* 40*   Coagulation Profile: Recent Labs  Lab 09/19/17 1732  INR 1.89   Cardiac Enzymes: Recent Labs  Lab 09/19/17 1038 09/19/17 1732 09/19/17 2211 09/20/17 0428  TROPONINI 0.06* 0.06* 0.06* 0.06*   BNP (last 3 results) No results for input(s): PROBNP in the last 8760 hours. HbA1C: No results for input(s): HGBA1C in the last 72 hours. CBG: Recent Labs  Lab 09/20/17 0628  GLUCAP 94   Lipid Profile: No results for input(s): CHOL, HDL, LDLCALC, TRIG, CHOLHDL, LDLDIRECT in the last 72 hours. Thyroid Function Tests: No results for input(s): TSH, T4TOTAL, FREET4, T3FREE, THYROIDAB in the last 72  hours. Anemia Panel: No results for input(s): VITAMINB12, FOLATE, FERRITIN, TIBC, IRON, RETICCTPCT in the last 72 hours. Urine analysis:    Component Value Date/Time   COLORURINE YELLOW 09/19/2017 [place] 09/19/2017 0947   LABSPEC 1.013 09/19/2017 0947   PHURINE 5.0 09/19/2017 0947   GLUCOSEU NEGATIVE 09/19/2017 0947   HGBUR NEGATIVE 09/19/2017 0947   BILIRUBINUR NEGATIVE 09/19/2017 0947   KETONESUR NEGATIVE 09/19/2017 0947   PROTEINUR NEGATIVE 09/19/2017 0947   UROBILINOGEN 0.2 11/21/2012 1750   NITRITE NEGATIVE 09/19/2017 0947   LEUKOCYTESUR NEGATIVE 09/19/2017 0947   Sepsis Labs: @LABRCNTIP (procalcitonin:4,lacticidven:4)  ) Recent Results (from the past 240 hour(s))  MRSA PCR Screening     Status: None   Collection Time: 09/19/17  8:28 PM  Result Value Ref Range Status   MRSA by PCR NEGATIVE NEGATIVE Final    Comment:        The GeneXpert MRSA Assay (FDA approved for NASAL specimens only), is one component of a comprehensive MRSA colonization surveillance program. It is not intended to diagnose MRSA infection nor to  guide or monitor treatment for MRSA infections. Performed at [facility], [place] [address] [place], [place] [address]          Radiology Studies: Dg Chest 2 View  Result Date: 09/19/2017 CLINICAL DATA:  UTI, pt has been receving antibiotic tx x3 days. Per family pt is confused, normally is A+Ox4 and able to hold a conversation, is now oriented x3. Pt has a laceration to right pinky, neither pt nor family able to recall what caused it, bleeding controlled at this time. Pt is on anticoagulants. Per family pt has an increase in swelling to both legs, has not taken her am meds today. Pt has hx of CHF, has a non-pacemaker. Family at bedside EXAM: CHEST - 2 VIEW COMPARISON:  08/29/2017 FINDINGS: Lungs are clear. Stable moderate cardiomegaly.  Stable left subclavian AICD. No effusion. Anterior vertebral endplate spurring at multiple  levels in the mid thoracic spine. IMPRESSION: Stable cardiomegaly.  No acute disease. Electronically Signed   By: Lucrezia Europe M.D.   On: 09/19/2017 10:13   Ct Head Wo Contrast  Result Date: 09/19/2017 CLINICAL DATA:  Altered level of consciousness. Urinary tract infection. EXAM: CT HEAD WITHOUT CONTRAST TECHNIQUE: Contiguous axial images were obtained from the base of the skull through the vertex without intravenous contrast. COMPARISON:  Head CT scan 07/09/2017 and 10/25/2015. FINDINGS: Brain: No evidence of acute infarction, hemorrhage, hydrocephalus, extra-axial collection or mass lesion/mass effect. Cortical atrophy and extensive chronic microvascular ischemic change are noted. Vascular: No hyperdense vessel or unexpected calcification. Skull: Intact. Sinuses/Orbits: Minimal mucosal thickening right sphenoid sinus and small mucous retention cyst or polyp right maxillary sinus noted. Other: None. IMPRESSION: No acute abnormality. Atrophy and chronic microvascular ischemic change. Electronically Signed   By: Inge Rise M.D.   On: 09/19/2017 12:16        Scheduled Meds: . amiodarone  200 mg Oral Daily  . apixaban  5 mg Oral BID  . bacitracin   Topical BID  . clopidogrel  75 mg Oral Q breakfast  . clotrimazole  1 Applicatorful Vaginal QHS  . furosemide  80 mg Intravenous Q12H  . nystatin   Topical TID  . sodium chloride flush  3 mL Intravenous Q12H   Continuous Infusions: . sodium chloride    . ampicillin (OMNIPEN) IV 1 g (09/20/17 0655)     LOS: 1 day    Time spent: 39 minutes    Walther Sanagustin,LAWAL, MD [facility] Pager [phone]  If 7PM-7AM, please contact night-coverage [email or web address] Password [email or web address] 09/20/2017, 8:16 AM

## 2017-09-20 NOTE — Progress Notes (Signed)
4/4  2135: This RN notified by central telemetry that the patient experiencing frequent PVCs, 5 beat run of VT, and was remaining in a V-paced rhythm. Pt. was assessed by RN, appearing asymptomatic , with stable VS. Night provider was notified of episode, RN continuing to monitor with no new orders.   4/5 0430:  Patient experienced approximately 2 minute sustained VTach, that returned to baseline v-paced rhythm without additional interventions. During episode, patient remained asymptomatic, no change in LOC, with stable VS. Night provider notified, as well as Rapid RN of occurrence. Orders for IV Mag received from night provider, RN will continue to monitor.  0620: Patient return to sustained VT for approximately another 2 minutes. Patient initially appeared asymptomatic, however, several significant changes in LOC/mentation were observed. Patient transitioned from responsive/oriented state, to complete unresponsiveness, with a brief labored breathing pattern. Moments later, patient rhythm returned to underlying v-pacing, with return in responsiveness as well. Vital signs were taken during episode, with a noted drop in SpO2 during altered breathing pattern. All other VS appearing unremarkable. Night provider notified, as well as Rapid RN. Post-episode, patient appearing asymptomatic, with VSS. Orders received. RN will continue to monitor.

## 2017-09-21 ENCOUNTER — Other Ambulatory Visit: Payer: Self-pay

## 2017-09-21 ENCOUNTER — Encounter (HOSPITAL_COMMUNITY): Payer: Self-pay

## 2017-09-21 DIAGNOSIS — Z95810 Presence of automatic (implantable) cardiac defibrillator: Secondary | ICD-10-CM

## 2017-09-21 DIAGNOSIS — Z515 Encounter for palliative care: Secondary | ICD-10-CM

## 2017-09-21 LAB — BASIC METABOLIC PANEL
ANION GAP: 13 (ref 5–15)
BUN: 39 mg/dL — ABNORMAL HIGH (ref 6–20)
CALCIUM: 8.8 mg/dL — AB (ref 8.9–10.3)
CHLORIDE: 95 mmol/L — AB (ref 101–111)
CO2: 26 mmol/L (ref 22–32)
Creatinine, Ser: 1.87 mg/dL — ABNORMAL HIGH (ref 0.44–1.00)
GFR calc non Af Amer: 24 mL/min — ABNORMAL LOW (ref >60)
GFR, EST AFRICAN AMERICAN: 28 mL/min — AB (ref >60)
Glucose, Bld: 112 mg/dL — ABNORMAL HIGH (ref 65–99)
Potassium: 4.6 mmol/L (ref 3.5–5.1)
Sodium: 134 mmol/L — ABNORMAL LOW (ref 135–145)

## 2017-09-21 LAB — GLUCOSE, CAPILLARY: Glucose-Capillary: 144 mg/dL — ABNORMAL HIGH (ref 65–99)

## 2017-09-21 LAB — AMMONIA: AMMONIA: 31 umol/L (ref 9–35)

## 2017-09-21 MED ORDER — AMIODARONE HCL 200 MG PO TABS
400.0000 mg | ORAL_TABLET | Freq: Two times a day (BID) | ORAL | Status: DC
  Administered 2017-09-21 – 2017-09-22 (×3): 400 mg via ORAL
  Filled 2017-09-21 (×3): qty 2

## 2017-09-21 MED ORDER — LORAZEPAM 0.5 MG PO TABS
0.5000 mg | ORAL_TABLET | Freq: Once | ORAL | Status: AC
  Administered 2017-09-21: 0.5 mg via ORAL
  Filled 2017-09-21: qty 1

## 2017-09-21 NOTE — Progress Notes (Signed)
Update on request from Romona Curls NP:   To turn off ICD or pacer:  Provider needs to place order: "contact [employer] to have ICD (or pacer or both) deactivated."  Contact [employer] at [phone]. Follow phone prompts for health care provider. Request to speak with the district representative for [employer], [place].   [employer] rep will page back the unit, answer any questions about pacer/icd, and discuss timing for procedure. On call reps available 24/7.
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
## 2017-09-21 NOTE — Consult Note (Signed)
Consultation Note Date: 09/21/2017   Patient Name: Bianca [name]  DOB: February 03, 1933  MRN: [id number]  Age / Sex: 84 y.o., female  PCP: Maurice Small, MD Referring Physician: Jani Gravel, MD  Reason for Consultation: Establishing goals of care and Psychosocial/spiritual support  HPI/Patient Profile: 84 y.o. female  with past medical history of atrial fib, chronic kidney disease stage III, systolic congestive heart failure with EF of 20% as of November 2018, cardiac cath 08/20/2017, admitted on 09/19/2017 with respiratory distress.  Patient had just been discharged from a skilled nursing facility 1 week prior to admission back to her independent living apartment.  She was found to have a UTI at the facility and culture was pending.  Per emergency room notes, culture is now back and shows multidrug-resistant enterococcus.  She has been started on ampicillin IV.  Per device interrogation, patient has been intermittently in VT but thus far has been paced out of V. tach before defibrillator has delivered a shock. Her chest x-ray was unremarkable.  Her BNP was 2673.  She was started on Lasix  Consult ordered for goals of care.  Palliative medicine team had the pleasure of meeting Bianca [name] as well as her family on 08/30/2017 when she was seen by Dr. Micheline Rough  Clinical Assessment and Goals of Care: Patient seen, chart reviewed.  Met with patient's son Bianca [name] as well as daughter Bianca [name] via phone and cousin Orbie Hurst.  Reviewed again patient's current clinical status, labs.  As family have met with a palliative medicine provider in the past we reviewed advanced directives such as CODE STATUS as well as defibrillator, prognosis, and perhaps what to expect going forward.  Per my discussion with Dr. Haroldine Laws, patient has been in V. tach intermittently.  Thus far pacemaker component of her device has been able to pace her out of V.  tach before her AICD delivers a shock.  Discussed the risks and benefits of an active AICD/pacemaker in the setting of end-stage heart failure particularly as we consider Mrs. Quizhpi quality of life.  Per her son Bianca [name] and daughter Bianca [name], patient has repeatedly has said she is  "weary".  She has been struggling with loss of independence and being in a skilled nursing facility for brief rehab recently was very hard on Bianca [name].  Discussed further that if device was deactivated patient would die from a natural death secondary to acute heart failure.  We also reviewed terms of full code versus DNR.  Both daughter and son have elected DO NOT RESUSCITATE.  Daughter Bianca [name] is questioning whether another echocardiogram could add anything to the picture if nothing else helping them to come to grips with their mothers decline.  I did share with him in terms of prognosis, at this point she would qualify for her hospice Medicare benefit.  This family unfortunately has had the loss of their brother, Mrs. Greenly son, just in 13-Aug-2022 of this year.  He died at [facility]  Patient at this point is unable to speak for herself.  Her healthcare proxy's are her son Bianca [name] and daughter Bianca [name]    SUMMARY OF RECOMMENDATIONS   DNR DNI.  Armandina Gemma form on the chart Repeat echocardiogram Family likely to make a decision by 09/22/2017 after echocardiogram results are in regarding the de-activation of AICD Patient is not full comfort care at this point.  Continue to treat the treatable Code Status/Advance Care Planning:  DNR    Symptom Management:   Pain: Patient is verbalizing mild intermittent back pain and at this point not ambulatory (she has severe osteoarthritis); continue with Tylenol as needed for mild pain.  Monitor for need for up titration to agents such as Ultram 50 mg every 6 hours as needed or Vicodin 1-2 tabs every 6-8 hours as needed  Dyspnea: Continue with targeted pulmonary treatments such as  oxygen, Lasix.  Monitor for acute respiratory distress and need to initiate opioids  UTI: Continue with IV antibiotics  Palliative Prophylaxis:   Aspiration, Bowel Regimen, Delirium Protocol, Eye Care, Frequent Pain Assessment, Oral Care and Turn Reposition   Psycho-social/Spiritual:   Desire for further Chaplaincy support:no  Additional Recommendations: Grief/Bereavement Support  Prognosis:   < 6 months in the setting of end-stage heart failure with an ejection fraction of 20%, intermittent V. tach, chronic kidney disease stage III, multidrug-resistant UTI.  Patient would qualify for her hospice Medicare benefit in the home or facility.  If she were to decline further she may qualify for residential hospice  Discharge Planning: To Be Determined      Primary Diagnoses: Present on Admission: . AICD (automatic cardioverter/defibrillator) present . Acute systolic heart failure ([place]) . Acute respiratory failure with hypoxia ([place]) . High cholesterol . Coronary artery disease . Acute metabolic encephalopathy . Recent urinary tract infection . Elevated troponin . AF (paroxysmal atrial fibrillation) ([place]) . Chronic kidney disease (CKD), stage III (moderate) ([facility]) . Acute on chronic respiratory failure with hypoxia ([place]) . Enterococcus UTI . Yeast vaginitis . Sepsis ([place]) . Laceration of left pinky finger   I have reviewed the medical record, interviewed the patient and family, and examined the patient. The following aspects are pertinent.  Past Medical History:  Diagnosis Date  . Acute blood loss anemia   . AICD (automatic cardioverter/defibrillator) present   . Arthritis    "back, feet, legs" (08/20/2017)  . Basal cell carcinoma    "cut off scalp; burned off face" (08/20/2017)  . CAD (coronary artery disease)/LBBB 01/01/2017  . CHF (congestive heart failure) ([place])   . Chronic lower back pain   . Chronic systolic heart failure ([place])   . CKD (chronic kidney disease), stage  III ([place]) 12/22/2015  . Complication of anesthesia ~ 2015   "quit breathing during colonoscopy" (08/20/2017)  . Depression 11/24/2015  . Gout    "on daily RX" (08/20/2017)  . High cholesterol   . History of blood transfusion 10/2015   "S/P fall w/right side hematoma"  . Hypertension   . Hypokalemia   . Myocardial infarction ([place]) 07/2006; 11/2006  . Neuromuscular disorder ([place])   . On home oxygen therapy    "2L at night w/CPAP" (08/20/2017)  . OSA on CPAP   . Paroxysmal atrial fibrillation (H[facility])   . Peripheral neuropathy   . Physical deconditioning   . Pneumonia    "once" (08/20/2017)  . Presence of permanent cardiac pacemaker   . Right flank hematoma 10/2015   Social History   Socioeconomic History  . Marital status: Widowed    Spouse name: Not on file  .  Number of children: Not on file  . Years of education: Not on file  . Highest education level: Not on file  Occupational History  . Not on file  Social Needs  . Financial resource strain: Not on file  . Food insecurity:    Worry: Not on file    Inability: Not on file  . Transportation needs:    Medical: Not on file    Non-medical: Not on file  Tobacco Use  . Smoking status: Former Smoker    Packs/day: 0.12    Years: 4.00    Pack years: 0.48    Types: Cigarettes  . Smokeless tobacco: Never Used  . Tobacco comment: 'quit smoking in the 1970s"  Substance and Sexual Activity  . Alcohol use: No  . Drug use: No  . Sexual activity: Not Currently  Lifestyle  . Physical activity:    Days per week: Not on file    Minutes per session: Not on file  . Stress: Not on file  Relationships  . Social connections:    Talks on phone: Not on file    Gets together: Not on file    Attends religious service: Not on file    Active member of club or organization: Not on file    Attends meetings of clubs or organizations: Not on file    Relationship status: Not on file  Other Topics Concern  . Not on file  Social History Narrative  .  Not on file   Family History  Problem Relation Age of Onset  . Stroke Mother   . Congestive Heart Failure Mother   . Tuberculosis Father   . Diabetes Brother   . Stroke Brother   . Cancer Brother   . Heart attack Son    Scheduled Meds: . amiodarone  400 mg Oral BID  . apixaban  5 mg Oral BID  . bacitracin   Topical BID  . clopidogrel  75 mg Oral Q breakfast  . clotrimazole  1 Applicatorful Vaginal QHS  . metoprolol succinate  25 mg Oral Daily  . nystatin   Topical TID  . sodium chloride flush  3 mL Intravenous Q12H  . spironolactone  25 mg Oral Daily   Continuous Infusions: . sodium chloride    . ampicillin (OMNIPEN) IV Stopped (09/21/17 1300)   PRN Meds:.sodium chloride, acetaminophen, ondansetron (ZOFRAN) IV, sodium chloride flush Medications Prior to Admission:  Prior to Admission medications   Medication Sig Start Date End Date Taking? Authorizing Provider  acetaminophen (TYLENOL) 650 MG CR tablet Take 1,300 mg by mouth 2 (two) times daily before a meal.   Yes [provider]  albuterol (PROVENTIL) (2.5 MG/3ML) 0.083% nebulizer solution Take 2.5 mg by nebulization every 6 (six) hours as needed for wheezing or shortness of breath.   Yes [provider]  allopurinol (ZYLOPRIM) 100 MG tablet Take 100 mg by mouth See admin instructions. Takes along with a 300 mg tablet to equal 400 mg    Yes [provider]  allopurinol (ZYLOPRIM) 300 MG tablet Take 300 mg by mouth See admin instructions. Take with the '100mg'$  tablet to equal '400mg'$  daily    Yes [provider]  amiodarone (PACERONE) 200 MG tablet Take 1 tablet (200 mg total) by mouth daily. 09/13/17  Yes Evans Lance, MD  apixaban (ELIQUIS) 5 MG TABS tablet Take 5 mg by mouth 2 (two) times daily.   Yes [provider]  aspirin 81 MG chewable tablet Chew 1  tablet (81 mg total) by mouth daily. 08/22/17  Yes Clegg, Amy D, NP  atorvastatin (LIPITOR) 40 MG tablet Take 1 tablet (40 mg total)  by mouth daily. 08/21/17 08/21/18 Yes Clegg, Amy D, NP  benzocaine-menthol (CHLORASEPTIC) 6-10 MG lozenge Take 1 lozenge by mouth as needed for sore throat.   Yes [provider]  Calcium Carbonate-Vit D-Min (CALCIUM/VITAMIN D/MINERALS) 600-200 MG-UNIT TABS Take 1 tablet by mouth 2 (two) times daily.    Yes [provider]  Cholecalciferol (VITAMIN D3) 1000 UNITS CAPS Take 1,000 Units by mouth daily.    Yes [provider]  clopidogrel (PLAVIX) 75 MG tablet Take 1 tablet (75 mg total) by mouth daily with breakfast. 08/22/17  Yes Clegg, Amy D, NP  colestipol (COLESTID) 1 G tablet Take 2 g by mouth at bedtime.    Yes [provider]  gabapentin (NEURONTIN) 400 MG capsule Take 1 capsule (400 mg total) by mouth 3 (three) times daily. 08/02/17  Yes Sheikh, Omair Latif, DO  guaiFENesin (MUCINEX) 600 MG 12 hr tablet Take 1 tablet (600 mg total) by mouth 2 (two) times daily. 11/29/15  Yes Theodis Blaze, MD  isosorbide mononitrate (IMDUR) 30 MG 24 hr tablet Take 0.5 tablets (15 mg total) by mouth daily. 08/12/17 11/10/17 Yes Isaiah Serge, NP  meclizine (ANTIVERT) 12.5 MG tablet Take 12.5 mg by mouth 3 (three) times daily as needed for dizziness.   Yes [provider]  metolazone (ZAROXOLYN) 2.5 MG tablet Take 2.5 mg by mouth once a week.   Yes [provider]  metoprolol succinate (TOPROL-XL) 25 MG 24 hr tablet Take 1 tablet (25 mg total) by mouth daily. 07/18/17  Yes Jerline Pain, MD  nitrofurantoin, macrocrystal-monohydrate, (MACROBID) 100 MG capsule Take 100 mg by mouth daily. 09/16/17  Yes [provider]  nitroGLYCERIN (NITROSTAT) 0.4 MG SL tablet Place 1 tablet (0.4 mg total) under the tongue every 5 (five) minutes as needed for chest pain. 11/24/12  Yes Jettie Booze, MD  nortriptyline (PAMELOR) 25 MG capsule Take 25 mg by mouth at bedtime.   Yes [provider]  OXYGEN Inhale 2 L/min into the lungs at bedtime as needed (uses as  bedtime everynight and then occasionally iun the daytime as needed for SOB).    Yes [provider]  pantoprazole (PROTONIX) 40 MG tablet Take 1 tablet (40 mg total) by mouth daily. 08/30/17 08/30/18 Yes Georgiana Shore, NP  potassium chloride SA (K-DUR,KLOR-CON) 20 MEQ tablet Take 1 tablet (20 mEq total) by mouth 2 (two) times daily. 08/30/17  Yes Georgiana Shore, NP  Probiotic Product (PROBIOTIC PO) Take 1 tablet by mouth daily. Rid-Bid   Yes [provider]  senna-docusate (SENOKOT-S) 8.6-50 MG tablet Take 1 tablet by mouth 2 (two) times daily as needed for mild constipation.    Yes [provider]  spironolactone (ALDACTONE) 25 MG tablet Take 1 tablet (25 mg total) by mouth daily. 08/19/17  Yes Bensimhon, Shaune Pascal, MD  torsemide (DEMADEX) 20 MG tablet Take 80 mg by mouth in the AM and 40 mg by mouth in the PM   Yes [provider]   Allergies  Allergen Reactions  . Quinapril Hcl Swelling    Tongue and throat  . Tape Other (See Comments)    Plastic tape causes irritation.   . Coreg [Carvedilol]     Very sleepy   . Tessalon [Benzonatate] Swelling and Other (See Comments)    Capsule opened in  mouth, not an allergy  . Neosporin [Neomycin-Polymyxin-Gramicidin] Rash   Review of Systems  Unable to perform ROS: Acuity of condition    Physical Exam  Constitutional: She appears well-developed and well-nourished.  Acutely ill-appearing elderly female; somnolent, complaining of feeling flushed, hot; has a cool washcloth around her neck  HENT:  Head: Normocephalic and atraumatic.  Neck: Normal range of motion.  Cardiovascular:  Intermittent ventricular tachycardia  Pulmonary/Chest: Effort normal.  No increased work of breathing noted at rest  Abdominal: Soft.  Neurological:  Somnolent Short-term memory deficits noted  Skin: Skin is warm and dry. There is pallor.  Psychiatric:  Confused Recognizes her family, oriented to person  Nursing note and vitals  reviewed.   Vital Signs: BP 102/83 (BP Location: Right Arm)   Pulse 76   Temp 98.3 F (36.8 C) (Oral)   Resp 19   Ht '5\' 6"'$  (1.676 m)   Wt 97.3 kg (214 lb 8.1 oz)   LMP  (LMP Unknown)   SpO2 97%   BMI 34.62 kg/m  Pain Scale: 0-10 POSS *See Group Information*: 1-Acceptable,Awake and alert Pain Score: 0-No pain   SpO2: SpO2: 97 % O2 Device:SpO2: 97 % O2 Flow Rate: .O2 Flow Rate (L/min): 2 L/min  IO: Intake/output summary:   Intake/Output Summary (Last 24 hours) at 09/21/2017 1637 Last data filed at 09/21/2017 1400 Gross per 24 hour  Intake 1160 ml  Output 925 ml  Net 235 ml    LBM: Last BM Date: 09/20/17 Baseline Weight: Weight: 96.7 kg (213 lb 3 oz) Most recent weight: Weight: 97.3 kg (214 lb 8.1 oz)     Palliative Assessment/Data:   Flowsheet Rows     Most Recent Value  Intake Tab  Referral Department  Cardiology  Unit at Time of Referral  Cardiac/Telemetry Unit  Palliative Care Primary Diagnosis  Cardiac  Date Notified  09/20/17  Palliative Care Type  Return patient Palliative Care  Reason for referral  Clarify Goals of Care  Date of Admission  09/19/17  Date first seen by Palliative Care  09/21/17  # of days Palliative referral response time  1 Day(s)  # of days IP prior to Palliative referral  1  Clinical Assessment  Palliative Performance Scale Score  40%  Pain Max last 24 hours  Not able to report  Pain Min Last 24 hours  Not able to report  Dyspnea Max Last 24 Hours  Not able to report  Dyspnea Min Last 24 hours  Not able to report  Nausea Max Last 24 Hours  Not able to report  Nausea Min Last 24 Hours  Not able to report  Anxiety Max Last 24 Hours  Not able to report  Anxiety Min Last 24 Hours  Not able to report  Other Max Last 24 Hours  Not able to report  Psychosocial & Spiritual Assessment  Palliative Care Outcomes  Patient/Family meeting held?  Yes  Who was at the meeting?  pt, son, dtr and cousin  Patient/Family wishes: Interventions  discontinued/not started   Mechanical Ventilation, Vasopressors, Trach, BiPAP  Palliative Care follow-up planned  Yes, Facility      Time In: 1500 Time Out: 1620 Time Total: 80 min Greater than 50%  of this time was spent counseling and coordinating care related to the above assessment and plan. Staffed with Dr. Haroldine Laws  Signed by: Dory Horn, NP   Please contact Palliative Medicine Team phone at [phone] for questions and concerns.  For individual provider: See  Amion

## 2017-09-21 NOTE — Plan of Care (Signed)
  Problem: Elimination: Goal: Will not experience complications related to urinary retention Outcome: Not Progressing  Pt unable to void on her own during the day. Bladder scan showed >778mL in bladder. Foley catheter inserted to drain bladder. Pts foley not putting out adequate urine during the shift. Attempted to reposition foley balloon. Paged on-call MD about pt having possible bladder spasms and pt possibly clinching and retaining urine. Will continue to monitor.

## 2017-09-21 NOTE — Progress Notes (Signed)
Patient ID: Bianca [name], female   DOB: 1932-07-07, 84 y.o.   MRN: [id number]                                                                PROGRESS NOTE                                                                                                                                                                                                             Patient Demographics:    Bianca [name], is a 84 y.o. female, DOB - [birth year], WUJ:[id number]  Admit date - 09/19/2017   Admitting Physician Lady Deutscher, MD  Outpatient Primary MD for the patient is Maurice Small, MD  LOS - 2  Outpatient Specialists:      Candee Furbish (cardiology)  Chief Complaint  Patient presents with  . Urinary Tract Infection       Brief Narrative  85 y.o. female with medical history significant for new diagnosis of CAD with recent PCI to LAD March 9562, systolic heart failure with an EF of 25%, history of ICD/pacer with Optivol capabilities, encephalopathy secondary to hyper ammonia in the context of passive hepatic congestion from heart failure, atrial fibrillation with recent issues with rhythm control on Eliquis, recurrent UTI, history of V. tach, stage III chronic kidney disease and dyslipidemia.  Patient has recently been discharged from skilled nursing facility last week and has returned to her independent living.  While she was the SNF she began exhibiting signs of UTI so urinalysis and culture was obtained but since unclear as to causative organism initiation of antibiotic therapy was delayed until culture results available about 2 days ago.  Subsequent culture positive for multidrug-resistant enterococcus sensitive to ampicillin, Macrobid, linezolid and vancomycin.  Patient has taken at least 2 doses of Macrobid.  Today the family was called to the independent living facility because patient had apparently injured herself and there was a significant amount of blood noted by staff.  Upon family arrival  confirmed patient with small laceration to pinky finger with significant bleeding noted at that time-patient is on Eliquis.  Family reports large pool of blood on patient's bed as well as blood splattered all over the room as well as bloody hand cramps on the walls and doors.  Patient also was confused  and not at her baseline mentation.  Family noted that patient also had increasing pedal and lower extremity edema.  Daughter states that past 2 weights have been 206 and 209 pounds.  Because of all of the above patient was brought to the ER for further evaluation and treatment.  In the ER patient's blood pressures were normal for her with systolic ranges in the low 100s.  She was confirmed with increasing lower extremity edema.  Room air saturations have dipped as low as 79% prompting application of nasal cannula oxygen.  Chest x-ray was unremarkable but BNP was elevated at 2673.  She also had subtle elevation in troponin at 0.06.  Because of her significant cardiac history cardiology was consulted and agreed that patient likely has acute heart failure exacerbation and have given a dose of Lasix 80 mg IV x1.  In addition patient had borderline acute kidney injury with baseline creatinine rising from 1.19 to 1.56.  Patient apparently has issues with chronic urinary incontinence.  She had difficulty voiding while here and a bladder scan was performed that revealed 528 cc of urine so went in and out catheterization was performed and subsequently Purewick urinary collection device was applied by the nursing staff.  Patient has remained somewhat lethargic despite improving her O2 saturations and given history of metabolic encephalopathy in the context of elevated ammonia levels in the past ammonia level has been obtained but not yet resulted.  I have also reviewed the urine culture results from previous nursing facility with sensitivities and have discussed with pharmacy plan is to institute IV ampicillin.  Blood  cultures were also be collected given concerns of a possible sepsis physiology evolving.  Current lactate is normal and as noted patient's blood pressure is at baseline for her.  ED Course:  Vital Signs: BP 104/68   Pulse 89   Temp 98.1 F (36.7 C) (Oral)   Resp 18   LMP  (LMP Unknown)   SpO2 97%  Chest x-ray: Neg CT head: No acute abnormality Lab data: Sodium 134, potassium 4.4, chloride 96, CO2 23, glucose 113, BUN 41, creatinine 1.56, LFTs normal except for mildly elevated total bilirubin 1.9, BNP 2674, troponin 0.06, lactic acid 1.89, white count 7400 with normal differential noting patient currently on Macrobid, hemoglobin 11.9, platelets 246,000, urinalysis unremarkable noting patient currently on Macrobid, repeat urine culture pending from ER Medications and treatments: Lasix 80 mg IV x1      Subjective:    [place]ell [place] today has seems still slightly confused per staff, but answers appropriate this am to questioning.    No headache, No chest pain, No abdominal pain - No Nausea, No new weakness tingling or numbness, No Cough - SOB.    Assessment  & Plan :    Principal Problem:   Acute respiratory failure with hypoxia (HCC) Active Problems:   AICD (automatic cardioverter/defibrillator) present   Acute systolic heart failure (HCC)   High cholesterol   History of sustained ventricular tachycardia   Coronary artery disease   Acute metabolic encephalopathy   Recent urinary tract infection   Elevated troponin   AF (paroxysmal atrial fibrillation) (HCC)   Chronic kidney disease (CKD), stage III (moderate) (HCC)   Acute on chronic respiratory failure with hypoxia (HCC)   Enterococcus UTI   Yeast vaginitis   Sepsis ([place])   Laceration of left pinky finger    #1 acute on chronic respiratory failure with hypoxia: Oxygen sats improved on oxygen at the moment. Patient is resting  comfortably. Continue oxygen at 2 L/m and titrate. Continue diuresis.  #2 acute on chronic  systolic dysfunction CHF: Patient is responding to diuresis at the moment. About 1.5 L negative since admission. We will follow weight daily.  #3 nonsustained V. tach: Patient had multiple episode of nonsustained V. tach this morning. It appears had AICD fired. Potassium and magnesium were given and repletion is ongoing. Recheck potassium and magnesium this afternoon.  Cardiology consulted, amio increased 4/5, appreciate input   #4 Renal insufficiency 4/6, probably a result of diuresis Defer to cardiology regarding Diuresis, pt appears off Lasix Check cmp in am  #5 altered mental status: Suspected hepatic encephalopathy from hepatic congestion. Recheck ammonia level if elevated give lactulose. Patient is arousable in this awake at the moment  #6 laceration of the left finger: Patient had bleeding at the facility from her injury. H&H appears stable at the moment. We'll continue to monitor.  #7 enterococcal UTI: This is multidrug resistant but sensitive to ampicillin. Continue ampicillin IV  #8 mild elevated troponin: Probably secondary to CHF. Troponins are flat.  #9 hypokalemia: Potassium is 3.1 this morning. Continue to replete potassium and recheck at noon.        Code Status : FULL CODE  Family Communication  : w patient  Disposition Plan  : SNF  Barriers For Discharge :   Consults  :  cardiology  Procedures  :     DVT Prophylaxis  :  Lovenox -- SCDs  Lab Results  Component Value Date   PLT 241 09/19/2017    Antibiotics  :   IV ampicillin 4/4>>>    Anti-infectives (From admission, onward)   Start     Dose/Rate Route Frequency Ordered Stop   09/19/17 1700  ampicillin (OMNIPEN) 1 g in sodium chloride 0.9 % 100 mL IVPB     1 g 300 mL/hr over 20 Minutes Intravenous Every 6 hours 09/19/17 1630     09/19/17 1515  ceFEPIme (MAXIPIME) 2 g in sodium chloride 0.9 % 100 mL IVPB  Status:  Discontinued     2 g 200 mL/hr over 30 Minutes Intravenous  Once 09/19/17  1501 09/19/17 1514        Objective:   Vitals:   09/20/17 2321 09/21/17 0300 09/21/17 0455 09/21/17 0555  BP: 95/73   124/90  Pulse: 83     Resp: 18  20   Temp: 97.6 F (36.4 C)  98.1 F (36.7 C)   TempSrc: Oral  Oral   SpO2: 96%     Weight:  97.3 kg (214 lb 8.1 oz)    Height:        Wt Readings from Last 3 Encounters:  09/21/17 97.3 kg (214 lb 8.1 oz)  09/16/17 96.9 kg (213 lb 9.6 oz)  09/13/17 98.9 kg (218 lb)     Intake/Output Summary (Last 24 hours) at 09/21/2017 1448 Last data filed at 09/21/2017 0601 Gross per 24 hour  Intake 860 ml  Output 570 ml  Net 290 ml     Physical Exam  Awake Alert, Oriented X 3, No new F.N deficits, Normal affect [place].AT,PERRAL Supple Neck,No JVD, No cervical lymphadenopathy appriciated.  Symmetrical Chest wall movement, Good air movement bilaterally, CTAB,  Decrease in bs at right lung base.  RRR,s1, s2, 2/6 sem apex +ve B.Sounds, Abd Soft, No tenderness, No organomegaly appriciated, No rebound - guarding or rigidity. No Cyanosis, Clubbing or edema, No new Rash or bruise        Data  Review:    CBC Recent Labs  Lab 09/19/17 1038 09/19/17 1732  WBC 7.4 7.7  HGB 11.9* 11.6*  HCT 37.4 37.4  PLT 246 241  MCV 91.9 91.9  MCH 29.2 28.5  MCHC 31.8 31.0  RDW 16.1* 15.9*  LYMPHSABS 2.3 2.7  MONOABS 0.6 0.7  EOSABS 0.0 0.1  BASOSABS 0.0 0.0    Chemistries  Recent Labs  Lab 09/16/17 1108 09/19/17 1038 09/20/17 0428 09/20/17 1104 09/21/17 0301  NA 137 134* 139 138 134*  K 2.8* 4.4 3.1* 3.2* 4.6  CL 95* 96* 96* 99* 95*  CO2 29 23 27 27 26   GLUCOSE 97 113* 91 122* 112*  BUN 37* 41* 34* 32* 39*  CREATININE 1.19* 1.56* 1.17* 1.19* 1.87*  CALCIUM 9.5 9.7 9.1 9.0 8.8*  MG  --  1.9  --  2.4  --   AST 24 29  --   --   --   ALT 13* 16  --   --   --   ALKPHOS 79 77  --   --   --   BILITOT 1.5* 1.9*  --   --   --     ------------------------------------------------------------------------------------------------------------------ No results for input(s): CHOL, HDL, LDLCALC, TRIG, CHOLHDL, LDLDIRECT in the last 72 hours.  Lab Results  Component Value Date   HGBA1C 5.0 11/26/2015   ------------------------------------------------------------------------------------------------------------------ No results for input(s): TSH, T4TOTAL, T3FREE, THYROIDAB in the last 72 hours.  Invalid input(s): FREET3 ------------------------------------------------------------------------------------------------------------------ No results for input(s): VITAMINB12, FOLATE, FERRITIN, TIBC, IRON, RETICCTPCT in the last 72 hours.  Coagulation profile Recent Labs  Lab 09/19/17 1732  INR 1.89    No results for input(s): DDIMER in the last 72 hours.  Cardiac Enzymes Recent Labs  Lab 09/19/17 1732 09/19/17 2211 09/20/17 0428  TROPONINI 0.06* 0.06* 0.06*   ------------------------------------------------------------------------------------------------------------------    Component Value Date/Time   BNP 2,673.5 (H) 09/19/2017 1038    Inpatient Medications  Scheduled Meds: . amiodarone  200 mg Oral BID  . apixaban  5 mg Oral BID  . bacitracin   Topical BID  . clopidogrel  75 mg Oral Q breakfast  . clotrimazole  1 Applicatorful Vaginal QHS  . metoprolol succinate  25 mg Oral Daily  . nystatin   Topical TID  . sodium chloride flush  3 mL Intravenous Q12H  . spironolactone  25 mg Oral Daily   Continuous Infusions: . sodium chloride    . ampicillin (OMNIPEN) IV Stopped (09/21/17 0601)   PRN Meds:.sodium chloride, acetaminophen, ondansetron (ZOFRAN) IV, sodium chloride flush  Micro Results Recent Results (from the past 240 hour(s))  Urine culture     Status: None   Collection Time: 09/19/17  2:43 PM  Result Value Ref Range Status   Specimen Description URINE, CLEAN CATCH  Final   Special Requests  Normal  Final   Culture   Final    NO GROWTH Performed at [facility], [place] [address] [place], [place] [address]    Report Status 09/20/2017 FINAL  Final  Culture, blood (Routine X 2) w Reflex to ID Panel     Status: None (Preliminary result)   Collection Time: 09/19/17  5:40 PM  Result Value Ref Range Status   Specimen Description BLOOD LEFT ANTECUBITAL  Final   Special Requests   Final    BOTTLES DRAWN AEROBIC AND ANAEROBIC Blood Culture results may not be optimal due to an excessive volume of blood received in culture bottles   Culture  Final    NO GROWTH < 24 HOURS Performed at [facility], [place] [address] [place], [place] [address]    Report Status PENDING  Incomplete  Culture, blood (Routine X 2) w Reflex to ID Panel     Status: None (Preliminary result)   Collection Time: 09/19/17  5:51 PM  Result Value Ref Range Status   Specimen Description BLOOD LEFT HAND  Final   Special Requests   Final    BOTTLES DRAWN AEROBIC ONLY Blood Culture results may not be optimal due to an excessive volume of blood received in culture bottles   Culture   Final    NO GROWTH < 24 HOURS Performed at [facility], [place] [address], [place], [place] [address]    Report Status PENDING  Incomplete  MRSA PCR Screening     Status: None   Collection Time: 09/19/17  8:28 PM  Result Value Ref Range Status   MRSA by PCR NEGATIVE NEGATIVE Final    Comment:        The GeneXpert MRSA Assay (FDA approved for NASAL specimens only), is one component of a comprehensive MRSA colonization surveillance program. It is not intended to diagnose MRSA infection nor to guide or monitor treatment for MRSA infections. Performed at [facility], [place] [address], [place], [place] [address]     Radiology Reports Dg Chest 2 View  Result Date: 09/19/2017 CLINICAL DATA:  UTI, pt has been receving antibiotic tx x3 days. Per family pt is confused, normally is A+Ox4 and able to hold a  conversation, is now oriented x3. Pt has a laceration to right pinky, neither pt nor family able to recall what caused it, bleeding controlled at this time. Pt is on anticoagulants. Per family pt has an increase in swelling to both legs, has not taken her am meds today. Pt has hx of CHF, has a non-pacemaker. Family at bedside EXAM: CHEST - 2 VIEW COMPARISON:  08/29/2017 FINDINGS: Lungs are clear. Stable moderate cardiomegaly.  Stable left subclavian AICD. No effusion. Anterior vertebral endplate spurring at multiple levels in the mid thoracic spine. IMPRESSION: Stable cardiomegaly.  No acute disease. Electronically Signed   By: Lucrezia Europe M.D.   On: 09/19/2017 10:13   Dg Chest 2 View  Result Date: 08/27/2017 CLINICAL DATA:  Cough EXAM: CHEST - 2 VIEW COMPARISON:  August 22, 2017 FINDINGS: There is no edema or consolidation. There is cardiomegaly with pulmonary vascularity within normal limits. Pacemaker leads are attached to the right atrium, right ventricle, and coronary sinus. No pneumothorax. No adenopathy. There is degenerative change in the thoracic spine. IMPRESSION: Cardiomegaly. Pacemaker leads as described. No edema or consolidation. Electronically Signed   By: Lowella Grip III M.D.   On: 08/27/2017 12:13   Dg Chest 2 View  Result Date: 08/22/2017 CLINICAL DATA:  Pt reports she had a heart catheterization on 08/22/17 and states mid chest pain started today, a few hours ago. Pt reports area is tender to palpation; reports intermittent palpitations. Hx of HTN, CAD, and CHF. Former smoker. EXAM: CHEST - 2 VIEW COMPARISON:  07/23/2017 FINDINGS: Patient has LEFT-sided transvenous pacemaker with leads to the RIGHT atrium, RIGHT ventricle, and coronary sinus. The heart is mildly enlarged. There are prominent interstitial markings consistent with interstitial edema. No overt alveolar edema or consolidations. No pleural effusions. IMPRESSION: Cardiomegaly and mild interstitial edema. Electronically Signed    By: Nolon Nations M.D.   On: 08/22/2017 18:19   Ct  Head Wo Contrast  Result Date: 09/19/2017 CLINICAL DATA:  Altered level of consciousness. Urinary tract infection. EXAM: CT HEAD WITHOUT CONTRAST TECHNIQUE: Contiguous axial images were obtained from the base of the skull through the vertex without intravenous contrast. COMPARISON:  Head CT scan 07/09/2017 and 10/25/2015. FINDINGS: Brain: No evidence of acute infarction, hemorrhage, hydrocephalus, extra-axial collection or mass lesion/mass effect. Cortical atrophy and extensive chronic microvascular ischemic change are noted. Vascular: No hyperdense vessel or unexpected calcification. Skull: Intact. Sinuses/Orbits: Minimal mucosal thickening right sphenoid sinus and small mucous retention cyst or polyp right maxillary sinus noted. Other: None. IMPRESSION: No acute abnormality. Atrophy and chronic microvascular ischemic change. Electronically Signed   By: Inge Rise M.D.   On: 09/19/2017 12:16   Ct Abdomen W Contrast  Result Date: 08/28/2017 CLINICAL DATA:  Abnormal liver function.  Hepatic encephalopathy EXAM: CT ABDOMEN WITH CONTRAST TECHNIQUE: Multidetector CT imaging of the abdomen was performed using the standard protocol following bolus administration of intravenous contrast. CONTRAST:  79mL ISOVUE-300 IOPAMIDOL (ISOVUE-300) INJECTION 61% COMPARISON:  CT 10/25/2015 FINDINGS: Lower chest:  Lung bases are clear.  Heart is enlarged. Hepatobiliary: No focal lesion within the liver. No ascites. Postcholecystectomy. No biliary duct dilatation Pancreas: Normal pancreatic parenchymal intensity. No ductal dilatation or inflammation. Spleen: Normal spleen. Adrenals/urinary tract: Adrenal glands and kidneys are normal. Stomach/Bowel: Stomach and limited view of the bowel is unremarkable. There is a laxity of the RIGHT abdominal wall with some shifting of the intraperitoneal contents rightward. Vascular/Lymphatic: No abdominal aneurysm. No retroperitoneal  adenopathy Musculoskeletal: No aggressive osseous lesion IMPRESSION: 1. No focal hepatic lesion.  No ascites. 2. No biliary obstruction. 3. Cardiomegaly Electronically Signed   By: Suzy Bouchard M.D.   On: 08/28/2017 19:31   Dg Chest Port 1 View  Result Date: 08/29/2017 CLINICAL DATA:  Shortness of breath. EXAM: PORTABLE CHEST 1 VIEW COMPARISON:  Chest x-ray dated August 27, 2017. FINDINGS: Unchanged left chest wall pacemaker. Stable moderate cardiomegaly. Normal pulmonary vascularity. No focal consolidation, pleural effusion, or pneumothorax. No acute osseous abnormality. IMPRESSION: Stable cardiomegaly.  No active disease. Electronically Signed   By: Titus Dubin M.D.   On: 08/29/2017 15:01   US Abdomen Limited Ruq  Result Date: 08/27/2017 CLINICAL DATA:  84 year old female with headache and cephalopathy. Prior cholecystectomy. Initial encounter. EXAM: ULTRASOUND ABDOMEN LIMITED RIGHT UPPER QUADRANT COMPARISON:  10/25/2015 CT. FINDINGS: Gallbladder: Post cholecystectomy. Common bile duct: Diameter: 2.6 mm Liver: 5 mm calcification versus tiny amount of gas left lobe liver. No liver lesion otherwise noted. Portal vein is patent on color Doppler imaging with normal direction of blood flow towards the liver. Small amount of free fluid along the inferior aspect of the liver. Etiology indeterminate. IMPRESSION: Post cholecystectomy. 5 mm calcification versus tiny amount a gas left lobe liver. Small amount of free fluid along the inferior aspect the liver. Etiology indeterminate. These results will be called to the ordering clinician or representative by the Radiologist Assistant, and communication documented in the PACS or zVision Dashboard. Electronically Signed   By: Genia Del M.D.   On: 08/27/2017 12:42    Time Spent in minutes  30   Jani Gravel M.D on 09/21/2017 at 7:12 AM  Between 7am to 7pm - Pager - [phone]  After 7pm go to [email or web address] - password [facility]  [facility] -  Office   [phone]

## 2017-09-22 ENCOUNTER — Inpatient Hospital Stay (HOSPITAL_COMMUNITY): Payer: PPO

## 2017-09-22 DIAGNOSIS — N390 Urinary tract infection, site not specified: Secondary | ICD-10-CM

## 2017-09-22 DIAGNOSIS — I5021 Acute systolic (congestive) heart failure: Secondary | ICD-10-CM

## 2017-09-22 DIAGNOSIS — I340 Nonrheumatic mitral (valve) insufficiency: Secondary | ICD-10-CM

## 2017-09-22 DIAGNOSIS — I361 Nonrheumatic tricuspid (valve) insufficiency: Secondary | ICD-10-CM

## 2017-09-22 DIAGNOSIS — J9621 Acute and chronic respiratory failure with hypoxia: Secondary | ICD-10-CM

## 2017-09-22 DIAGNOSIS — B952 Enterococcus as the cause of diseases classified elsewhere: Secondary | ICD-10-CM

## 2017-09-22 DIAGNOSIS — R740 Nonspecific elevation of levels of transaminase and lactic acid dehydrogenase [LDH]: Secondary | ICD-10-CM

## 2017-09-22 LAB — CBC
HEMATOCRIT: 39.9 % (ref 36.0–46.0)
Hemoglobin: 12 g/dL (ref 12.0–15.0)
MCH: 27.9 pg (ref 26.0–34.0)
MCHC: 30.1 g/dL (ref 30.0–36.0)
MCV: 92.8 fL (ref 78.0–100.0)
Platelets: 282 10*3/uL (ref 150–400)
RBC: 4.3 MIL/uL (ref 3.87–5.11)
RDW: 16.2 % — AB (ref 11.5–15.5)
WBC: 14 10*3/uL — ABNORMAL HIGH (ref 4.0–10.5)

## 2017-09-22 LAB — COMPREHENSIVE METABOLIC PANEL
ALT: 901 U/L — ABNORMAL HIGH (ref 14–54)
ANION GAP: 18 — AB (ref 5–15)
AST: 1348 U/L — ABNORMAL HIGH (ref 15–41)
Albumin: 3.5 g/dL (ref 3.5–5.0)
Alkaline Phosphatase: 81 U/L (ref 38–126)
BILIRUBIN TOTAL: 2.9 mg/dL — AB (ref 0.3–1.2)
BUN: 55 mg/dL — ABNORMAL HIGH (ref 6–20)
CALCIUM: 9.1 mg/dL (ref 8.9–10.3)
CO2: 22 mmol/L (ref 22–32)
Chloride: 92 mmol/L — ABNORMAL LOW (ref 101–111)
Creatinine, Ser: 2.21 mg/dL — ABNORMAL HIGH (ref 0.44–1.00)
GFR calc Af Amer: 23 mL/min — ABNORMAL LOW (ref >60)
GFR, EST NON AFRICAN AMERICAN: 20 mL/min — AB (ref >60)
Glucose, Bld: 86 mg/dL (ref 65–99)
POTASSIUM: 5.4 mmol/L — AB (ref 3.5–5.1)
Sodium: 132 mmol/L — ABNORMAL LOW (ref 135–145)
TOTAL PROTEIN: 6.2 g/dL — AB (ref 6.5–8.1)

## 2017-09-22 LAB — ECHOCARDIOGRAM COMPLETE
Height: 66 in
Weight: 3470.92 oz

## 2017-09-22 MED ORDER — SODIUM CHLORIDE 0.9 % IV SOLN
1.0000 g | Freq: Three times a day (TID) | INTRAVENOUS | Status: DC
  Administered 2017-09-22 – 2017-09-23 (×3): 1 g via INTRAVENOUS
  Filled 2017-09-22 (×4): qty 1000

## 2017-09-22 MED ORDER — TRAZODONE HCL 50 MG PO TABS
25.0000 mg | ORAL_TABLET | Freq: Every day | ORAL | Status: DC
  Administered 2017-09-22: 25 mg via ORAL
  Filled 2017-09-22: qty 1

## 2017-09-22 MED ORDER — TRAZODONE HCL 50 MG PO TABS
25.0000 mg | ORAL_TABLET | ORAL | Status: AC
  Administered 2017-09-22: 25 mg via ORAL
  Filled 2017-09-22: qty 1

## 2017-09-22 NOTE — Progress Notes (Signed)
  Echocardiogram 2D Echocardiogram limited, due to poor patient compliance, has been performed.  Jennette Dubin 09/22/2017, 10:40 AM

## 2017-09-22 NOTE — Progress Notes (Signed)
Daily Progress Note   Patient Name: Bianca [name]       Date: 09/22/2017 DOB: 30-Mar-1933  Age: 84 y.o. MRN#: [id number] Attending Physician: Mariel Aloe, MD Primary Care Physician: Maurice Small, MD Admit Date: 09/19/2017  Reason for Consultation/Follow-up: Establishing goals of care and Psychosocial/spiritual support  Subjective: Pt seen, chart reviewed. Met with son, dtr via phone, DIL's, and cousin. Pt has been agitated off and on all night and into today. Now in mittens, mats on the floor  Discussed ECHO impression with family now showing EF 15%( was 20-25%) as well as reviewed worsening kidney function. Family still discussing whether to deactivate defibrillator. We reviewed again role of hospice either in SNF or if pt continues to decline, possibly residential hospice  Length of Stay: 3  Current Medications: Scheduled Meds:  . amiodarone  400 mg Oral BID  . apixaban  5 mg Oral BID  . bacitracin   Topical BID  . clopidogrel  75 mg Oral Q breakfast  . clotrimazole  1 Applicatorful Vaginal QHS  . metoprolol succinate  25 mg Oral Daily  . nystatin   Topical TID  . sodium chloride flush  3 mL Intravenous Q12H  . spironolactone  25 mg Oral Daily    Continuous Infusions: . sodium chloride    . ampicillin (OMNIPEN) IV 1 g (09/22/17 1412)    PRN Meds: sodium chloride, acetaminophen, ondansetron (ZOFRAN) IV, sodium chloride flush  Physical Exam  Constitutional: She appears well-developed and well-nourished.  Acutely ill appearing elderly female; currently agitated, trying to climb OOB  HENT:  Head: Normocephalic and atraumatic.  Neck: Normal range of motion.  Cardiovascular: Normal rate.  Pulmonary/Chest:  Mild increased work of breathing  Abdominal: Soft.  Neurological:  She is alert.  Confused, restless Difficult to redirect  Skin: Skin is warm and dry. There is pallor.  Psychiatric:  Psychomotor restlessness  Nursing note and vitals reviewed.           Vital Signs: BP 114/74   Pulse 88   Temp 98 F (36.7 C) (Axillary)   Resp (!) 21   Ht 5' 6"  (1.676 m)   Wt 98.4 kg (216 lb 14.9 oz)   LMP  (LMP Unknown)   SpO2 100%   BMI 35.01 kg/m  SpO2: SpO2: 100 % O2 Device:  O2 Device: Nasal Cannula O2 Flow Rate: O2 Flow Rate (L/min): 2 L/min  Intake/output summary:   Intake/Output Summary (Last 24 hours) at 09/22/2017 1529 Last data filed at 09/22/2017 1412 Gross per 24 hour  Intake 403 ml  Output 850 ml  Net -447 ml   LBM: Last BM Date: 09/20/17 Baseline Weight: Weight: 96.7 kg (213 lb 3 oz) Most recent weight: Weight: 98.4 kg (216 lb 14.9 oz)       Palliative Assessment/Data:    Flowsheet Rows     Most Recent Value  Intake Tab  Referral Department  Cardiology  Unit at Time of Referral  Cardiac/Telemetry Unit  Palliative Care Primary Diagnosis  Cardiac  Date Notified  09/20/17  Palliative Care Type  Return patient Palliative Care  Reason for referral  Clarify Goals of Care  Date of Admission  09/19/17  Date first seen by Palliative Care  09/21/17  # of days Palliative referral response time  1 Day(s)  # of days IP prior to Palliative referral  1  Clinical Assessment  Palliative Performance Scale Score  40%  Pain Max last 24 hours  Not able to report  Pain Min Last 24 hours  Not able to report  Dyspnea Max Last 24 Hours  Not able to report  Dyspnea Min Last 24 hours  Not able to report  Nausea Max Last 24 Hours  Not able to report  Nausea Min Last 24 Hours  Not able to report  Anxiety Max Last 24 Hours  Not able to report  Anxiety Min Last 24 Hours  Not able to report  Other Max Last 24 Hours  Not able to report  Psychosocial & Spiritual Assessment  Palliative Care Outcomes  Patient/Family meeting held?  Yes  Who was at the  meeting?  pt, son, dtr and cousin  Patient/Family wishes: Interventions discontinued/not started   Mechanical Ventilation, Vasopressors, Trach, BiPAP  Palliative Care follow-up planned  Yes, Facility      Patient Active Problem List   Diagnosis Date Noted  . Palliative care by specialist   . AICD (automatic cardioverter/defibrillator) present 09/19/2017  . Acute systolic heart failure ([place]) 09/19/2017  . Acute respiratory failure with hypoxia ([place]) 09/19/2017  . High cholesterol 09/19/2017  . History of sustained ventricular tachycardia 09/19/2017  . Coronary artery disease 09/19/2017  . Acute metabolic encephalopathy 25/85/2778  . Recent urinary tract infection 09/19/2017  . Elevated troponin 09/19/2017  . AF (paroxysmal atrial fibrillation) ([place]) 09/19/2017  . Chronic kidney disease (CKD), stage III (moderate) ([place]) 09/19/2017  . Acute on chronic respiratory failure with hypoxia ([place]) 09/19/2017  . Enterococcus UTI 09/19/2017  . Yeast vaginitis 09/19/2017  . Sepsis ([place]) 09/19/2017  . Laceration of left pinky finger 09/19/2017  . Ischemic cardiomyopathy   . Recurrent UTI 07/30/2017  . Altered mental status 07/30/2017  . Obstructive sleep apnea on CPAP 07/30/2017  . Yeast dermatitis 07/30/2017  . Hypokalemia 07/30/2017  . Atrial fibrillation, transient ([place]) 07/30/2017  . Arthritis of right knee/neuropathy 07/30/2017  . Class 1 obesity with body mass index (BMI) of 32.0 to 32.9 in adult 07/30/2017  . Dyslipidemia 07/30/2017  . CKD (chronic kidney disease), symptom management only, stage 3 (moderate) ([place]) 07/30/2017  . Hypertension 01/01/2017  . Sleep apnea 01/01/2017  . CAD (coronary artery disease)/LBBB 01/01/2017  . History of ventricular tachycardia 01/01/2017  . PAF (paroxysmal atrial fibrillation) ([place]) 01/01/2017  . CKD (chronic kidney disease) stage 3, GFR 30-59 ml/min (HCC) 01/01/2017  . Peripheral  neuropathy 01/01/2017  . OSA on CPAP 01/01/2017  . Obesity (BMI  30-39.9) 01/01/2017  . ICD (implantable cardioverter-defibrillator) in place 01/01/2017  . Dyspnea   . Ventricular tachycardia ([place])   . Subclinical hypothyroidism 12/25/2015  . CKD (chronic kidney disease), stage III ([place]) 12/22/2015  . Hypoxia 12/22/2015  . Hypoxemia 12/21/2015  . CHF exacerbation ([place]) 11/24/2015  . Physical deconditioning 11/24/2015  . Hyperglycemia 11/24/2015  . Peripheral neuropathy 11/24/2015  . HLD (hyperlipidemia) 11/24/2015  . Depression 11/24/2015  . Chest pain 11/24/2015  . Neuropathy involving both lower extremities   . Paroxysmal atrial fibrillation (HCC)   . OSA (obstructive sleep apnea)   . E. coli UTI   . Subcutaneous hematoma   . Fall 10/25/2015  . Afib ([place]) 01/19/2015  . Long-term (current) use of anticoagulants 07/20/2014  . Persistent atrial fibrillation ([place]) 07/20/2014  . Goals of care, counseling/discussion 07/16/2013  . Chronic systolic heart failure ([place]) 07/16/2013  . History of implantable cardioverter-defibrillator (ICD) placement 07/16/2013  . Morbid obesity ([place]) 07/16/2013  . Chronic anticoagulation 07/16/2013  . Knee osteoarthritis 07/16/2013  . HYPOKALEMIA 08/09/2010  . Neuropathy ([place]) 08/09/2010  . Chronic diastolic (congestive) heart failure ([place]) 08/09/2010  . Automatic implantable cardioverter-defibrillator in situ 08/09/2010    Palliative Care Assessment & Plan   Patient Profile: 84 y.o. female  with past medical history of atrial fib, chronic kidney disease stage III, systolic congestive heart failure with EF of 20% as of November 2018, cardiac cath 08/20/2017, admitted on 09/19/2017 with respiratory distress.  Patient had just been discharged from a skilled nursing facility 1 week prior to admission back to her independent living apartment.  She was found to have a UTI at the facility and culture was pending.  Per emergency room notes, culture is now back and shows multidrug-resistant enterococcus.  She has been started on  ampicillin IV.  Per device interrogation, patient has been intermittently in VT but thus far has been paced out of V. tach before defibrillator has delivered a shock. Her chest x-ray was unremarkable.  Her BNP was 2673.  She was started on Lasix  Consult ordered for goals of care.  Palliative medicine team had the pleasure of meeting Mrs. Burmaster as well as her family on 08/30/2017 when she was seen by Dr. Micheline Rough.  Pt now DNR but not full comfort care   Assessment: Pt is agitated. Per nursing report and family pt is essentially drug naiive except for melatonin in the past. There is some mention of ativan causing a paradoxical reaction.  Recommendations/Plan:  Agitation: Recommend trazadone 25 mg qhs and may repeat in 1 hour if no effect as well as PRN daily. Give 1st dose now  Family requesting a brief mtg with Dr. Haroldine Laws prior to final decision on defibrillator. Dr. Haroldine Laws planning on meeting with family on 4/8 between 0900 and 1000    Code Status:    Code Status Orders  (From admission, onward)        Start     Ordered   09/21/17 1535  Do not attempt resuscitation (DNR)  Continuous    Question Answer Comment  In the event of cardiac or respiratory ARREST Do not call a "code blue"   In the event of cardiac or respiratory ARREST Do not perform Intubation, CPR, defibrillation or ACLS   In the event of cardiac or respiratory ARREST Use medication by any route, position, wound care, and other measures to relive pain and suffering. May use oxygen,  suction and manual treatment of airway obstruction as needed for comfort.      09/21/17 1534    Code Status History    Date Active Date Inactive Code Status Order ID Comments User Context   09/19/2017 1623 09/21/2017 1534 Full Code [id number]  Samella Parr, NP ED   08/23/2017 0001 08/30/2017 2341 Full Code [id number]  Doylene Canning, MD ED   08/20/2017 1755 08/21/2017 1627 Full Code [id number]  Shirley Friar, PA-C  Inpatient   08/20/2017 1122 08/20/2017 1755 Full Code [id number]  Burnell Blanks, MD Inpatient   07/30/2017 1250 08/02/2017 1727 Full Code [id number]  Samella Parr, NP ED   01/01/2017 1813 01/04/2017 1724 Full Code [id number]  Samella Parr, NP Inpatient   02/29/2016 1118 02/29/2016 1531 Full Code [id number]  Evans Lance, MD Inpatient   12/22/2015 0042 01/01/2016 1920 Full Code [id number]  Edwin Dada, MD Inpatient   11/24/2015 0834 11/29/2015 1908 Full Code [id number]  Waldemar Dickens, MD ED   11/24/2015 0829 11/24/2015 0834 Full Code [id number]  Waldemar Dickens, MD ED   10/25/2015 0657 11/03/2015 1829 Full Code [id number]  Stark Klein, MD Inpatient    Advance Directive Documentation     Most Recent Value  Type of Advance Directive  Healthcare Power of Attorney  Pre-existing out of facility DNR order (yellow form or pink MOST form)  -  "MOST" Form in Place?  -       Prognosis:  < 6 months in the setting of end-stage heart failure with an ejection fraction of 20%, intermittent V. tach, chronic kidney disease stage III, multidrug-resistant UTI.  Patient would qualify for her hospice Medicare benefit in the home or facility.  If she were to decline further she may qualify for residential hospice    Discharge Planning:  To Be Determined  Care plan was discussed with Dr. Haroldine Laws  Thank you for allowing the Palliative Medicine Team to assist in the care of this patient.   Time In: 1500 Time Out: 1540 Total Time 40 min Prolonged Time Billed  no       Greater than 50%  of this time was spent counseling and coordinating care related to the above assessment and plan.  Dory Horn, NP  Please contact Palliative Medicine Team phone at [phone] for questions and concerns.

## 2017-09-22 NOTE — Progress Notes (Addendum)
PROGRESS NOTE    Bianca [name]  AST:[id number] DOB: [birth year] DOA: 09/19/2017 PCP: Maurice Small, MD   Brief Narrative: Bianca [name] is a 84 y.o. femalewith medical history significant fornew diagnosis of CAD with recent PCI to LAD March 9892, systolic heart failure with an EF of 25%, history of ICD/pacer withOptivolcapabilities,encephalopathy secondary to hyper ammonia in the context of passive hepatic congestion from heart failure, atrial fibrillation with recent issues with rhythm control on Eliquis, recurrent UTI, history of V. tach, stage III chronic kidney disease and dyslipidemia. She presented with a UTI and here with acute respiratory failure metabolic encephalopathy, non-sustained v-tach. She is on ampicillin and was progressing fairly. Palliative care medicine consulted for goals of care. Developed acute kidney injury, in addition to elevated LFTs.   Assessment & Plan:   Principal Problem:   Acute respiratory failure with hypoxia (HCC) Active Problems:   Goals of care, counseling/discussion   AICD (automatic cardioverter/defibrillator) present   Acute systolic heart failure (HCC)   High cholesterol   History of sustained ventricular tachycardia   Coronary artery disease   Acute metabolic encephalopathy   Recent urinary tract infection   Elevated troponin   AF (paroxysmal atrial fibrillation) (HCC)   Chronic kidney disease (CKD), stage III (moderate) (HCC)   Acute on chronic respiratory failure with hypoxia (HCC)   Enterococcus UTI   Yeast vaginitis   Sepsis ([place])   Laceration of left pinky finger   Palliative care by specialist   Acute on chronic respiratory failure with hypoxia Likely secondary to acute heart failure. No distress -wean to room air  Acute on chronic systolic heart failure Appears improved with lasix. -Continue metoprolol and spironolactone  Non-sustained V-tach -Cardiology recommendations: increased to amiodarone 400 mg  BID -Palliative care  Paroxysmal atrial fibrillation On amiodarone and Eliquis -Cardiology recommendations: continue amio (increased dose) and Eliquis  Acute kidney injury on CKD 3 Baseline around 1.2, up to 2.21 today. Thought secondary to diuresis however patient has not had lasix in a few days. Mild hypotension overnight. Good urine output. May need to consider holding spironolactone. -Repeat BMP -UOP  Elevated LFTs Hyperbilirubinemia Acute rise. Unknown etiology. Could be secondary to mild hypotensive episode -RUQ ultrasound  Altered mental status. Improved. ?hepatic encephalopathy, however, ammonia normal. No bowel movement.  Laceration of left finger -Watch  Enterococcal UTI Sensitive to ampicillin -Continue ampicillin  Elevated troponin Mild. Flat trend. Likely secondary to CHF.  Hypokalemia Resolved. Now hyperkalemia.  Hyperkalemia Mild. In setting of AKI. -Watch for now. -Repeat potassium this afternoon -Repeat BMP in AM  Leukocytosis Unknown etiology. No source. Treating UTI. Afebrile. -Repeat CBC in AM    DVT prophylaxis: Eliquis Code Status:   Code Status: DNR Family Communication: Son and niece at bedside Disposition Plan: Discharge pending treatment of heart failure in addition to goals of care and evaluation of elevated LFTs/AKI   Consultants:   Cardiology  Palliative care  Procedures:   Transthoracic Echocardiogram pending  Antimicrobials:  Ampicillin (4/4>>    Subjective: Afebrile. Intermittent episodes of V-tach  Objective: Vitals:   09/22/17 0321 09/22/17 0421 09/22/17 0430 09/22/17 0734  BP: (!) 120/52   (!) 104/91  Pulse:    66  Resp: 18   (!) 21  Temp: 97.7 F (36.5 C) 97.8 F (36.6 C)  98 F (36.7 C)  TempSrc: Oral Axillary  Axillary  SpO2:    100%  Weight: 99.7 kg (219 lb 12.8 oz)  98.4 kg (216 lb 14.9 oz)  Height:        Intake/Output Summary (Last 24 hours) at 09/22/2017 1132 Last data filed at 09/22/2017  0525 Gross per 24 hour  Intake 600 ml  Output 950 ml  Net -350 ml   Filed Weights   09/21/17 0300 09/22/17 0321 09/22/17 0430  Weight: 97.3 kg (214 lb 8.1 oz) 99.7 kg (219 lb 12.8 oz) 98.4 kg (216 lb 14.9 oz)    Examination:  General exam: Appears calm and comfortable Respiratory system: Clear to auscultation. Respiratory effort normal. Cardiovascular system: S1 & S2 heard, RRR. No murmurs, rubs, gallops or clicks. Gastrointestinal system: Abdomen is nondistended, soft and nontender. No organomegaly or masses felt. Normal bowel sounds heard. Central nervous system: Alert and oriented. No focal neurological deficits. Extremities: No edema. No calf tenderness Skin: No cyanosis. No rashes Psychiatry: Judgement and insight appear normal. Mood & affect appropriate.     Data Reviewed: I have personally reviewed following labs and imaging studies  CBC: Recent Labs  Lab 09/19/17 1038 09/19/17 1732 09/22/17 0453  WBC 7.4 7.7 14.0*  NEUTROABS 4.5 4.2  --   HGB 11.9* 11.6* 12.0  HCT 37.4 37.4 39.9  MCV 91.9 91.9 92.8  PLT 246 241 270   Basic Metabolic Panel: Recent Labs  Lab 09/19/17 1038 09/20/17 0428 09/20/17 1104 09/21/17 0301 09/22/17 0453  NA 134* 139 138 134* 132*  K 4.4 3.1* 3.2* 4.6 5.4*  CL 96* 96* 99* 95* 92*  CO2 23 27 27 26 22   GLUCOSE 113* 91 122* 112* 86  BUN 41* 34* 32* 39* 55*  CREATININE 1.56* 1.17* 1.19* 1.87* 2.21*  CALCIUM 9.7 9.1 9.0 8.8* 9.1  MG 1.9  --  2.4  --   --    GFR: Estimated Creatinine Clearance: 23.2 mL/min (A) (by C-G formula based on SCr of 2.21 mg/dL (H)). Liver Function Tests: Recent Labs  Lab 09/16/17 1108 09/19/17 1038 09/22/17 0453  AST 24 29 1,348*  ALT 13* 16 901*  ALKPHOS 79 77 81  BILITOT 1.5* 1.9* 2.9*  PROT 7.5 7.1 6.2*  ALBUMIN 3.8 3.9 3.5   No results for input(s): LIPASE, AMYLASE in the last 168 hours. Recent Labs  Lab 09/16/17 1109 09/19/17 1732 09/21/17 1339  AMMONIA 52* 40* 31   Coagulation  Profile: Recent Labs  Lab 09/19/17 1732  INR 1.89   Cardiac Enzymes: Recent Labs  Lab 09/19/17 1038 09/19/17 1732 09/19/17 2211 09/20/17 0428  TROPONINI 0.06* 0.06* 0.06* 0.06*   BNP (last 3 results) No results for input(s): PROBNP in the last 8760 hours. HbA1C: No results for input(s): HGBA1C in the last 72 hours. CBG: Recent Labs  Lab 09/20/17 0628 09/21/17 1156  GLUCAP 94 144*   Lipid Profile: No results for input(s): CHOL, HDL, LDLCALC, TRIG, CHOLHDL, LDLDIRECT in the last 72 hours. Thyroid Function Tests: No results for input(s): TSH, T4TOTAL, FREET4, T3FREE, THYROIDAB in the last 72 hours. Anemia Panel: No results for input(s): VITAMINB12, FOLATE, FERRITIN, TIBC, IRON, RETICCTPCT in the last 72 hours. Sepsis Labs: Recent Labs  Lab 09/19/17 1052 09/19/17 1732 09/19/17 2002  PROCALCITON  --  0.13  --   LATICACIDVEN 1.89 1.4 1.5    Recent Results (from the past 240 hour(s))  Urine culture     Status: None   Collection Time: 09/19/17  2:43 PM  Result Value Ref Range Status   Specimen Description URINE, CLEAN CATCH  Final   Special Requests Normal  Final   Culture   Final  NO GROWTH Performed at [facility], [place] [address] [place], [place] [address]    Report Status 09/20/2017 FINAL  Final  Culture, blood (Routine X 2) w Reflex to ID Panel     Status: None (Preliminary result)   Collection Time: 09/19/17  5:40 PM  Result Value Ref Range Status   Specimen Description BLOOD LEFT ANTECUBITAL  Final   Special Requests   Final    BOTTLES DRAWN AEROBIC AND ANAEROBIC Blood Culture results may not be optimal due to an excessive volume of blood received in culture bottles   Culture   Final    NO GROWTH 2 DAYS Performed at [facility], [place] [address], [place], [place] [address]    Report Status PENDING  Incomplete  Culture, blood (Routine X 2) w Reflex to ID Panel     Status: None (Preliminary result)   Collection Time: 09/19/17  5:51 PM   Result Value Ref Range Status   Specimen Description BLOOD LEFT HAND  Final   Special Requests   Final    BOTTLES DRAWN AEROBIC ONLY Blood Culture results may not be optimal due to an excessive volume of blood received in culture bottles   Culture   Final    NO GROWTH 2 DAYS Performed at [facility], [place] [address] [place], [place] [address]    Report Status PENDING  Incomplete  MRSA PCR Screening     Status: None   Collection Time: 09/19/17  8:28 PM  Result Value Ref Range Status   MRSA by PCR NEGATIVE NEGATIVE Final    Comment:        The GeneXpert MRSA Assay (FDA approved for NASAL specimens only), is one component of a comprehensive MRSA colonization surveillance program. It is not intended to diagnose MRSA infection nor to guide or monitor treatment for MRSA infections. Performed at [facility], [place] [address] [place], [place] [address]          Radiology Studies: No results found.      Scheduled Meds: . amiodarone  400 mg Oral BID  . apixaban  5 mg Oral BID  . bacitracin   Topical BID  . clopidogrel  75 mg Oral Q breakfast  . clotrimazole  1 Applicatorful Vaginal QHS  . metoprolol succinate  25 mg Oral Daily  . nystatin   Topical TID  . sodium chloride flush  3 mL Intravenous Q12H  . spironolactone  25 mg Oral Daily   Continuous Infusions: . sodium chloride    . ampicillin (OMNIPEN) IV       LOS: 3 days     Cordelia Poche, MD [facility] 09/22/2017, 11:32 AM Pager: [phone]  If 7PM-7AM, please contact night-coverage [email or web address] Password [facility] 09/22/2017, 11:32 AM

## 2017-09-23 ENCOUNTER — Other Ambulatory Visit (HOSPITAL_COMMUNITY): Payer: Self-pay | Admitting: *Deleted

## 2017-09-23 ENCOUNTER — Inpatient Hospital Stay (HOSPITAL_COMMUNITY): Admission: RE | Admit: 2017-09-23 | Payer: PPO | Source: Ambulatory Visit

## 2017-09-23 ENCOUNTER — Other Ambulatory Visit: Payer: Self-pay

## 2017-09-23 DIAGNOSIS — I509 Heart failure, unspecified: Secondary | ICD-10-CM

## 2017-09-23 DIAGNOSIS — G9341 Metabolic encephalopathy: Secondary | ICD-10-CM

## 2017-09-23 DIAGNOSIS — Z515 Encounter for palliative care: Secondary | ICD-10-CM

## 2017-09-23 DIAGNOSIS — I5022 Chronic systolic (congestive) heart failure: Secondary | ICD-10-CM

## 2017-09-23 DIAGNOSIS — I480 Paroxysmal atrial fibrillation: Secondary | ICD-10-CM

## 2017-09-23 DIAGNOSIS — N179 Acute kidney failure, unspecified: Secondary | ICD-10-CM

## 2017-09-23 DIAGNOSIS — I5023 Acute on chronic systolic (congestive) heart failure: Secondary | ICD-10-CM

## 2017-09-23 DIAGNOSIS — N183 Chronic kidney disease, stage 3 (moderate): Secondary | ICD-10-CM

## 2017-09-23 DIAGNOSIS — Z87440 Personal history of urinary (tract) infections: Secondary | ICD-10-CM

## 2017-09-23 DIAGNOSIS — Z7189 Other specified counseling: Secondary | ICD-10-CM

## 2017-09-23 LAB — BILIRUBIN, DIRECT: Bilirubin, Direct: 1.2 mg/dL — ABNORMAL HIGH (ref 0.1–0.5)

## 2017-09-23 LAB — MAGNESIUM: Magnesium: 2.3 mg/dL (ref 1.7–2.4)

## 2017-09-23 LAB — BASIC METABOLIC PANEL
Anion gap: 16 — ABNORMAL HIGH (ref 5–15)
BUN: 69 mg/dL — AB (ref 6–20)
CHLORIDE: 89 mmol/L — AB (ref 101–111)
CO2: 21 mmol/L — AB (ref 22–32)
CREATININE: 2.5 mg/dL — AB (ref 0.44–1.00)
Calcium: 8.6 mg/dL — ABNORMAL LOW (ref 8.9–10.3)
GFR calc Af Amer: 20 mL/min — ABNORMAL LOW (ref >60)
GFR calc non Af Amer: 17 mL/min — ABNORMAL LOW (ref >60)
GLUCOSE: 88 mg/dL (ref 65–99)
POTASSIUM: 5.6 mmol/L — AB (ref 3.5–5.1)
SODIUM: 126 mmol/L — AB (ref 135–145)

## 2017-09-23 LAB — ALKALINE PHOSPHATASE: Alkaline Phosphatase: 88 U/L (ref 38–126)

## 2017-09-23 LAB — PROTEIN, TOTAL: Total Protein: 6.1 g/dL — ABNORMAL LOW (ref 6.5–8.1)

## 2017-09-23 LAB — BILIRUBIN, TOTAL: Total Bilirubin: 3.2 mg/dL — ABNORMAL HIGH (ref 0.3–1.2)

## 2017-09-23 LAB — ALT: ALT: 789 U/L — ABNORMAL HIGH (ref 14–54)

## 2017-09-23 LAB — AST: AST: 842 U/L — ABNORMAL HIGH (ref 15–41)

## 2017-09-23 MED ORDER — ONDANSETRON 4 MG PO TBDP: 4.0000 mg | ORAL_TABLET | Freq: Four times a day (QID) | ORAL | Status: DC | PRN

## 2017-09-23 MED ORDER — ACETAMINOPHEN 650 MG RE SUPP: 650.0000 mg | Freq: Four times a day (QID) | RECTAL | Status: DC | PRN

## 2017-09-23 MED ORDER — POLYVINYL ALCOHOL 1.4 % OP SOLN
1.0000 [drp] | Freq: Four times a day (QID) | OPHTHALMIC | Status: DC | PRN
  Filled 2017-09-23: qty 15

## 2017-09-23 MED ORDER — HALOPERIDOL LACTATE 5 MG/ML IJ SOLN: 1.0000 mg | Freq: Four times a day (QID) | INTRAMUSCULAR | Status: DC | PRN

## 2017-09-23 MED ORDER — GLYCOPYRROLATE 0.2 MG/ML IJ SOLN: 0.2000 mg | INTRAMUSCULAR | Status: DC | PRN

## 2017-09-23 MED ORDER — MORPHINE 100MG IN NS 100ML (1MG/ML) PREMIX INFUSION
4.0000 mg/h | INTRAVENOUS | Status: DC
  Administered 2017-09-23: 4 mg/h via INTRAVENOUS
  Filled 2017-09-23: qty 100

## 2017-09-23 MED ORDER — TRAZODONE HCL 50 MG PO TABS: 25.0000 mg | ORAL_TABLET | Freq: Every day | ORAL | Status: DC

## 2017-09-23 MED ORDER — ONDANSETRON HCL 4 MG/2ML IJ SOLN: 4.0000 mg | Freq: Four times a day (QID) | INTRAMUSCULAR | Status: DC | PRN

## 2017-09-23 MED ORDER — BIOTENE DRY MOUTH MT LIQD: 15.0000 mL | OROMUCOSAL | Status: DC | PRN

## 2017-09-23 MED ORDER — ACETAMINOPHEN 325 MG PO TABS: 650.0000 mg | ORAL_TABLET | Freq: Four times a day (QID) | ORAL | Status: DC | PRN

## 2017-09-23 MED ORDER — MORPHINE BOLUS VIA INFUSION
2.0000 mg | INTRAVENOUS | Status: DC | PRN
  Filled 2017-09-23: qty 4

## 2017-09-23 MED ORDER — MORPHINE SULFATE (PF) 4 MG/ML IV SOLN
4.0000 mg | Freq: Once | INTRAVENOUS | Status: AC
  Administered 2017-09-23: 4 mg via INTRAVENOUS
  Filled 2017-09-23: qty 1

## 2017-09-23 NOTE — Progress Notes (Signed)
Report given to [place] at [facility].

## 2017-09-23 NOTE — Progress Notes (Signed)
CSW informed by palliative that pt family would like to pursue residential hospice- first choice [facility]  Referral made to [facility]- they will update CSW regarding medical clearance and bed availability  Jorge Ny, [place] Social Worker [phone]

## 2017-09-23 NOTE — Progress Notes (Signed)
Palliative:  I followed up with Bianca [name] who is still restless and agitated but not SOB. Her son, daughter-in-law, and sitter is at bedside. I followed up on conversation with Dr. Haroldine Laws and son confirms desire for comfort (morphine infusion was ordered by Dr. Haroldine Laws) and allow her rest, deactivation of AICD, and transition to [facility] if a bed is available. They recognize that she now has multiorgan failure and that QOL is extremely poor. Prognosis poor and < 2 weeks and likely days. CSW helping with referral to [facility]. Comfort orders placed. Emotional support provided to family at bedside.   20 min  Vinie Sill, NP Palliative Medicine Team Pager # [phone] (M-F 8a-5p) Team Phone # [phone] (Nights/Weekends)

## 2017-09-23 NOTE — Care Management Important Message (Signed)
Important Message  Patient Details  Name: Bianca [name] MRN: [id number] Date of Birth: 01-04-1933   Medicare Important Message Given:  Yes    Orbie Pyo 09/23/2017, 2:20 PM

## 2017-09-23 NOTE — Patient Outreach (Signed)
[facility]) Care Management  09/23/2017  EVETT KASSA 24-Sep-1932 [id number]   Assessment: 84 year old with recent admission 2/12-2/15 with confusion, heart failure exacerbation, UTI.4 hospitalizations and2 Emergency room visitsin the past six months. Client hospitalized overnight following cardiac catheterization and stent placement. Admitted 09/19/17 respiratory failure, heart failure.  RNCM received notification from [facility] Liaison that client is transferring to [facility] today. Update [facility] Pharmacist. Update primary care. RNCM will close case.  Thea Silversmith, RN, MSN, [place] Coordinator Cell: [phone]

## 2017-09-23 NOTE — Progress Notes (Addendum)
Advanced Heart Failure Rounding Note  PCP-Cardiologist: Candee Furbish, MD   Subjective:    Weight up 4 lbs. No diuretics since 4/5. Creatinine worsening up to 2.5 this am and K 5.6. Liver enzymes elevated yesterday with AST 1348, ALT 901.   Pt is confused this am. Only oriented to self. Very restless throughout the night per family. Family is anxious to speak with Dr Haroldine Laws about goals of care.   Abd [place] 09/22/17: 1. There is a small amount of ascites and a small right pleural effusion. 2. No other significant abnormalities.  Echo 09/22/17: - Left ventricle: The cavity size was severely dilated. Wall   thickness was increased in a pattern of mild LVH. Systolic   function was severely reduced. The estimated ejection fraction   was 15%. Diffuse hypokinesis. There is akinesis of the   inferolateral myocardium. Doppler parameters are consistent with   restrictive physiology, indicative of decreased left ventricular   diastolic compliance and/or increased left atrial pressure. - Mitral valve: Calcified annulus. There was severe regurgitation. - Left atrium: The atrium was severely dilated. - Right ventricle: The cavity size was mildly dilated. Systolic   function was moderately reduced. - Tricuspid valve: There was moderate regurgitation. - Pulmonary arteries: Systolic pressure was mildly increased.   Objective:   Weight Range: 220 lb 3.8 oz (99.9 kg) Body mass index is 35.55 kg/m.   Vital Signs:   Temp:  [97.7 F (36.5 C)-98.1 F (36.7 C)] 98 F (36.7 C) (04/08 0838) Pulse Rate:  [60-109] 60 (04/08 0416) Resp:  [18-21] 21 (04/08 0838) BP: (70-130)/(52-118) 130/118 (04/08 0416) SpO2:  [98 %-100 %] 98 % (04/08 0838) Weight:  [220 lb 3.8 oz (99.9 kg)] 220 lb 3.8 oz (99.9 kg) (04/08 0416) Last BM Date: 09/20/17  Weight change: Filed Weights   09/22/17 0321 09/22/17 0430 09/23/17 0416  Weight: 219 lb 12.8 oz (99.7 kg) 216 lb 14.9 oz (98.4 kg) 220 lb 3.8 oz (99.9 kg)     Intake/Output:   Intake/Output Summary (Last 24 hours) at 09/23/2017 0932 Last data filed at 09/23/2017 0556 Gross per 24 hour  Intake 103 ml  Output 600 ml  Net -497 ml      Physical Exam    General: Elderly. Restless.  HEENT: Normal Neck: Supple. JVP difficult but appears ~8. Carotids 2+ bilat; no bruits. No thyromegaly or nodule noted. Cor: PMI nondisplaced. RRR, No M/G/R noted Lungs: CTAB, normal effort. Abdomen: Soft, non-tender, non-distended, no HSM. No bruits or masses. +BS  Extremities: No cyanosis, clubbing, or rash. R and LLE no edema, cool to touch.  Neuro: Alert & orientedx3, cranial nerves grossly intact. moves all 4 extremities w/o difficulty. Affect pleasant   Telemetry   AV paced 60s. 4 beats NSVT. Personally reviewed.   EKG    No new tracings.   Labs    CBC Recent Labs    09/22/17 0453  WBC 14.0*  HGB 12.0  HCT 39.9  MCV 92.8  PLT 400   Basic Metabolic Panel Recent Labs    09/20/17 1104  09/22/17 0453 09/23/17 0435  NA 138   < > 132* 126*  K 3.2*   < > 5.4* 5.6*  CL 99*   < > 92* 89*  CO2 27   < > 22 21*  GLUCOSE 122*   < > 86 88  BUN 32*   < > 55* 69*  CREATININE 1.19*   < > 2.21* 2.50*  CALCIUM 9.0   < >  9.1 8.6*  MG 2.4  --   --   --    < > = values in this interval not displayed.   Liver Function Tests Recent Labs    09/22/17 0453  AST 1,348*  ALT 901*  ALKPHOS 81  BILITOT 2.9*  PROT 6.2*  ALBUMIN 3.5   No results for input(s): LIPASE, AMYLASE in the last 72 hours. Cardiac Enzymes No results for input(s): CKTOTAL, CKMB, CKMBINDEX, TROPONINI in the last 72 hours.  BNP: BNP (last 3 results) Recent Labs    07/30/17 0842 08/22/17 2115 09/19/17 1038  BNP 1,664.3* 962.0* 2,673.5*    ProBNP (last 3 results) No results for input(s): PROBNP in the last 8760 hours.   D-Dimer No results for input(s): DDIMER in the last 72 hours. Hemoglobin A1C No results for input(s): HGBA1C in the last 72 hours. Fasting Lipid  Panel No results for input(s): CHOL, HDL, LDLCALC, TRIG, CHOLHDL, LDLDIRECT in the last 72 hours. Thyroid Function Tests No results for input(s): TSH, T4TOTAL, T3FREE, THYROIDAB in the last 72 hours.  Invalid input(s): FREET3  Other results:   Imaging    US Abdomen Limited Ruq  Result Date: 09/22/2017 CLINICAL DATA:  Elevated LFTs. EXAM: ULTRASOUND ABDOMEN LIMITED RIGHT UPPER QUADRANT COMPARISON:  CT scan August 28, 2017 FINDINGS: Gallbladder: Surgically absent Common bile duct: Diameter: 6 mm Liver: Small hyperechoic region in the left hepatic lobe. This could represent a small hemangioma given the lack of a hepatic calcification on recent CT imaging. This is of doubtful significance. No suspicious mass. Portal vein is patent on color Doppler imaging with normal direction of blood flow towards the liver. Other: There is a small amount of ascites and a small right pleural effusion. IMPRESSION: 1. There is a small amount of ascites and a small right pleural effusion. 2. No other significant abnormalities. Electronically Signed   By: Dorise Bullion III M.D   On: 09/22/2017 12:43      Medications:     Scheduled Medications: . amiodarone  400 mg Oral BID  . apixaban  5 mg Oral BID  . bacitracin   Topical BID  . clopidogrel  75 mg Oral Q breakfast  . clotrimazole  1 Applicatorful Vaginal QHS  . metoprolol succinate  25 mg Oral Daily  . nystatin   Topical TID  . sodium chloride flush  3 mL Intravenous Q12H  . spironolactone  25 mg Oral Daily  . traZODone  25 mg Oral QHS     Infusions: . sodium chloride    . ampicillin (OMNIPEN) IV Stopped (09/23/17 0647)     PRN Medications:  sodium chloride, acetaminophen, ondansetron (ZOFRAN) IV, sodium chloride flush    Patient Profile   Bianca [name] is a 84 y.o. female with Chronic systolic CHF, initially NICM, now with component of CAD s/p PCI, Hx of Afib on Chronic AC, CKD III, s/p BiV ICD, Morbid obesity, VT, and hepatic  encephalopathy  Admitted with confusion and A/C CHF in setting of multi-drug resistant UTI  Assessment/Plan   1. Acute respiratory failure - CXR OK. On O2 via [place]. Multifactorial - Sats stable on [place].   2. VT - Seen by Dr. Lovena Le 09/13/17 and started on amiodarone with frequent PVCs.  - EP following. Device interrogated and showed VT with multiple episodes of ATP, but no shocks.  - Resume Toprol XL 25 mg daily   - Amio increased to 400 mg BID by EP.  - Goal K > 4.0 and Mg >  2.0  3. Acute on chronic systolic CHF.  s/p BiV ICD - [employer].  - Echo 11/2012 20-25% -> Med adjustments 10/2015 EF recovered.50-55%. - Now back down to LVEF 20% as recently as on Echo 04/2017 LVEF 20%, Grade 2 DD. Echo 09/22/17: EF 15% - Volume status stable.  - No diuretics since 4/5 - She has BiV pacing. Resume toprol XL at 25 mg daily.   - Hold spiro with K 5.6 this am.  - Palliative following. She remains DNR. Family has decided to pursue hospice care. They would like to transfer her to [facility] if bed is available. Hospice RN notified. [employer] rep aware that ICD needs to be deactivated.   4. CAD s/p PCI - Prox RCA/Mid RCA 30% stenosed - Prox LAD 95% -> s/p DES 08/20/17 - No s/s ischemia - She complete 1 mo ASA.  - Continue Plavix.   5. Paroxysmal AF  - suspect exacerbated by UTI. She is pacing this am. [employer] to assess burden.  - CHA2DS2-VASc 6. Regular pulse.  - Continue Eliquis.  - Continue amio 400 mg BID as above with VT.   6. Morbid obesity with h/o CO2 retention - using oxygen at night.  - Body mass index is 34.05 kg/m.  - Not on chronic O2. Has not been show to de-saturate with ambulation.No change.   7. R Flank Hematoma - 10/2015. Stable. She still complaints of tenderness at this sight.No change.   8. Deconditioning - Transitioning to hospice as above.   9. Hypokalemia - Now hyperkalemic at 5.6. Arlyce Harman held this am.   10. ID - WBC elevated this am at 14.0 -  Afebrile.   Dispo: Family meeting with Dr Haroldine Laws this am discussing that she is in multisystem organ failure and likely nearing the end of her life. Plans to transition to hospice care with hopes of getting a bed at [facility] ([address] [place] if bed unavailable). Hospice RN aware of transition and will be coming by shortly. Medtronic ICD to be deactivated. Rep is aware. Will DC all medications that do not offer comfort. Start on morphine drip with bolus.    Length of Stay: [place], NP  09/23/2017, 9:32 AM  Advanced Heart Failure Team Pager [phone] (M-F; 7a - 4p)  Please contact [facility] for night-coverage after hours (4p -7a ) and weekends on [email or web address]  Patient seen and examined with the above-signed Advanced Practice Provider and/or Housestaff. I personally reviewed laboratory data, imaging studies and relevant notes. I independently examined the patient and formulated the important aspects of the plan. I have edited the note to reflect any of my changes or salient points. I have personally discussed the plan with the patient and/or family.  Echo reviewed personally. EF now drop to 15%. On exam she is confused and agitated - likely uremic. Labs reveal progressive multisystem (kidney, liver) organ failure. She is not a candidate for advanced HF therapies. I had a long talk with her family today and expressed my belief that given her rapid and progressive deterioration over the past few weeks in the setting of end-stage HF the only option we have is Hospice care to prevent further suffering. They are in agreement. I also discussed this with Ms. Horvath who seemed to grasp some of this and said she was "weary". I had discussed with Hospice team over the weekend and I reached back out to them personally after the family meeting to discuss options of [employer] or transfer  to [facility]. A bed was located at [facility].   We have deactivated ICD and started morphine for comfort.   Total  time spent ~ 60 minutes. Over half that time spent discussing above. Time also spent coordinating multi-disciplinary care.   Glori Bickers, MD  9:54 PM

## 2017-09-23 NOTE — Progress Notes (Addendum)
Patient will discharge to [facility] Anticipated discharge date: 4/8 Family notified: at bedside Transportation by [facility]- called at 1:45pm Report #: [phone]  [place] signing off.  Jorge Ny, LCSW Clinical Social Worker [phone]

## 2017-09-23 NOTE — Progress Notes (Signed)
PROGRESS NOTE    Bianca [name]  HBZ:[id number] DOB: [birth year] DOA: 09/19/2017 PCP: Maurice Small, MD   Brief Narrative: Bianca [name] is a 84 y.o. femalewith medical history significant fornew diagnosis of CAD with recent PCI to LAD March 1017, systolic heart failure with an EF of 25%, history of ICD/pacer withOptivolcapabilities,encephalopathy secondary to hyper ammonia in the context of passive hepatic congestion from heart failure, atrial fibrillation with recent issues with rhythm control on Eliquis, recurrent UTI, history of V. tach, stage III chronic kidney disease and dyslipidemia. She presented with a UTI and here with acute respiratory failure metabolic encephalopathy, non-sustained v-tach. She is on ampicillin and was progressing fairly. Palliative care medicine consulted for goals of care. Developed acute kidney injury, in addition to elevated LFTs.   Assessment & Plan:   Principal Problem:   Acute respiratory failure with hypoxia (HCC) Active Problems:   Goals of care, counseling/discussion   AICD (automatic cardioverter/defibrillator) present   Acute systolic heart failure (HCC)   High cholesterol   History of sustained ventricular tachycardia   Coronary artery disease   Acute metabolic encephalopathy   Recent urinary tract infection   Elevated troponin   AF (paroxysmal atrial fibrillation) (HCC)   Chronic kidney disease (CKD), stage III (moderate) (HCC)   Acute on chronic respiratory failure with hypoxia (HCC)   Enterococcus UTI   Yeast vaginitis   Sepsis ([place])   Laceration of left pinky finger   Palliative care by specialist   Acute on chronic respiratory failure with hypoxia Likely secondary to acute heart failure. No distress -wean to room air  Acute on chronic systolic heart failure Appears improved with lasix. -Continue metoprolol and spironolactone  Non-sustained V-tach -Cardiology recommendations: increased to amiodarone 400 mg  BID -Palliative care  Paroxysmal atrial fibrillation On amiodarone and Eliquis -Cardiology recommendations: continue amio (increased dose) and Eliquis  Acute kidney injury on CKD 3 Baseline around 1.2, up to 2.5 today. Thought secondary to diuresis however patient has not had lasix in a few days. Possibly secondary to hypoperfusion from failing heart. Spironolactone held. UOP 600 mL over last 24 hours -Repeat BMP -UOP  Elevated LFTs Hyperbilirubinemia Acute rise. down trending AST/ALT with stable bilirubin. ?shock liver. RUQ ultrasound significant for mild ascites.  Altered mental status. Improved. ?hepatic encephalopathy, however, ammonia normal. No bowel movement.  Laceration of left finger -Watch  Enterococcal UTI Sensitive to ampicillin -Continue ampicillin  Elevated troponin Mild. Flat trend. Likely secondary to CHF.  Hypokalemia Resolved. Now hyperkalemia.  Hyperkalemia Mild. In setting of AKI. Slightly up trending. -Watch for now. -Repeat BMP in AM  Leukocytosis Unknown etiology. No source. Treating UTI. Afebrile. -Repeat CBC in AM  Hallucinations Unknown etiology. BUN is elevated and continues to rise. Patient not a danger to herself at this time.    DVT prophylaxis: Eliquis Code Status:   Code Status: DNR Family Communication: None at bedside at bedside Disposition Plan: Discharge pending treatment of heart failure in addition to goals of care and evaluation of elevated LFTs/AKI   Consultants:   Cardiology  Palliative care  Procedures:   Transthoracic Echocardiogram pending  Antimicrobials:  Ampicillin (4/4>>    Subjective: Afebrile. Intermittent episodes of V-tach  Objective: Vitals:   09/22/17 2305 09/22/17 2352 09/23/17 0416 09/23/17 0838  BP: 105/70 (!) 70/53 (!) 130/118   Pulse:  (!) 109 60   Resp:  18  (!) 21  Temp:  98.1 F (36.7 C) 97.9 F (36.6 C) 98 F (36.7 C)  TempSrc:  Oral Oral Oral  SpO2:  100% 100% 98%  Weight:    99.9 kg (220 lb 3.8 oz)   Height:        Intake/Output Summary (Last 24 hours) at 09/23/2017 1018 Last data filed at 09/23/2017 0556 Gross per 24 hour  Intake 103 ml  Output 600 ml  Net -497 ml   Filed Weights   09/22/17 0321 09/22/17 0430 09/23/17 0416  Weight: 99.7 kg (219 lb 12.8 oz) 98.4 kg (216 lb 14.9 oz) 99.9 kg (220 lb 3.8 oz)    Examination:  General exam: Appears calm and comfortable Respiratory system: Diminished. Respiratory effort normal. Cardiovascular system: S1 & S2 heard, RRR. No murmurs, rubs, gallops or clicks. Gastrointestinal system: Abdomen is nondistended but obese, soft and nontender. No organomegaly or masses felt. Normal bowel sounds heard. Central nervous system: Alert and oriented to person only. Extremities: No edema. No calf tenderness Skin: No cyanosis. No rashes Psychiatry: having a conversation with herself. Hallucinations.    Data Reviewed: I have personally reviewed following labs and imaging studies  CBC: Recent Labs  Lab 09/19/17 1038 09/19/17 1732 09/22/17 0453  WBC 7.4 7.7 14.0*  NEUTROABS 4.5 4.2  --   HGB 11.9* 11.6* 12.0  HCT 37.4 37.4 39.9  MCV 91.9 91.9 92.8  PLT 246 241 250   Basic Metabolic Panel: Recent Labs  Lab 09/19/17 1038 09/20/17 0428 09/20/17 1104 09/21/17 0301 09/22/17 0453 09/23/17 0435  NA 134* 139 138 134* 132* 126*  K 4.4 3.1* 3.2* 4.6 5.4* 5.6*  CL 96* 96* 99* 95* 92* 89*  CO2 23 27 27 26 22  21*  GLUCOSE 113* 91 122* 112* 86 88  BUN 41* 34* 32* 39* 55* 69*  CREATININE 1.56* 1.17* 1.19* 1.87* 2.21* 2.50*  CALCIUM 9.7 9.1 9.0 8.8* 9.1 8.6*  MG 1.9  --  2.4  --   --   --    GFR: Estimated Creatinine Clearance: 20.7 mL/min (A) (by C-G formula based on SCr of 2.5 mg/dL (H)). Liver Function Tests: Recent Labs  Lab 09/16/17 1108 09/19/17 1038 09/22/17 0453 09/23/17 0435  AST 24 29 1,348* 842*  ALT 13* 16 901* 789*  ALKPHOS 79 77 81 88  BILITOT 1.5* 1.9* 2.9* 3.2*  PROT 7.5 7.1 6.2* 6.1*   ALBUMIN 3.8 3.9 3.5  --    No results for input(s): LIPASE, AMYLASE in the last 168 hours. Recent Labs  Lab 09/16/17 1109 09/19/17 1732 09/21/17 1339  AMMONIA 52* 40* 31   Coagulation Profile: Recent Labs  Lab 09/19/17 1732  INR 1.89   Cardiac Enzymes: Recent Labs  Lab 09/19/17 1038 09/19/17 1732 09/19/17 2211 09/20/17 0428  TROPONINI 0.06* 0.06* 0.06* 0.06*   BNP (last 3 results) No results for input(s): PROBNP in the last 8760 hours. HbA1C: No results for input(s): HGBA1C in the last 72 hours. CBG: Recent Labs  Lab 09/20/17 0628 09/21/17 1156  GLUCAP 94 144*   Lipid Profile: No results for input(s): CHOL, HDL, LDLCALC, TRIG, CHOLHDL, LDLDIRECT in the last 72 hours. Thyroid Function Tests: No results for input(s): TSH, T4TOTAL, FREET4, T3FREE, THYROIDAB in the last 72 hours. Anemia Panel: No results for input(s): VITAMINB12, FOLATE, FERRITIN, TIBC, IRON, RETICCTPCT in the last 72 hours. Sepsis Labs: Recent Labs  Lab 09/19/17 1052 09/19/17 1732 09/19/17 2002  PROCALCITON  --  0.13  --   LATICACIDVEN 1.89 1.4 1.5    Recent Results (from the past 240 hour(s))  Urine culture     Status:  None   Collection Time: 09/19/17  2:43 PM  Result Value Ref Range Status   Specimen Description URINE, CLEAN CATCH  Final   Special Requests Normal  Final   Culture   Final    NO GROWTH Performed at [facility], [address] [place], [place] [address]    Report Status 09/20/2017 FINAL  Final  Culture, blood (Routine X 2) w Reflex to ID Panel     Status: None (Preliminary result)   Collection Time: 09/19/17  5:40 PM  Result Value Ref Range Status   Specimen Description BLOOD LEFT ANTECUBITAL  Final   Special Requests   Final    BOTTLES DRAWN AEROBIC AND ANAEROBIC Blood Culture results may not be optimal due to an excessive volume of blood received in culture bottles   Culture   Final    NO GROWTH 3 DAYS Performed at [facility], [place] [address] [place], [place] [address]    Report Status PENDING  Incomplete  Culture, blood (Routine X 2) w Reflex to ID Panel     Status: None (Preliminary result)   Collection Time: 09/19/17  5:51 PM  Result Value Ref Range Status   Specimen Description BLOOD LEFT HAND  Final   Special Requests   Final    BOTTLES DRAWN AEROBIC ONLY Blood Culture results may not be optimal due to an excessive volume of blood received in culture bottles   Culture   Final    NO GROWTH 3 DAYS Performed at [facility], [place] [address] [place], [place] [address]    Report Status PENDING  Incomplete  MRSA PCR Screening     Status: None   Collection Time: 09/19/17  8:28 PM  Result Value Ref Range Status   MRSA by PCR NEGATIVE NEGATIVE Final    Comment:        The GeneXpert MRSA Assay (FDA approved for NASAL specimens only), is one component of a comprehensive MRSA colonization surveillance program. It is not intended to diagnose MRSA infection nor to guide or monitor treatment for MRSA infections. Performed at [facility], [place] [address] [place],  [address]          Radiology Studies: US Abdomen Limited Ruq  Result Date: 09/22/2017 CLINICAL DATA:  Elevated LFTs. EXAM: ULTRASOUND ABDOMEN LIMITED RIGHT UPPER QUADRANT COMPARISON:  CT scan August 28, 2017 FINDINGS: Gallbladder: Surgically absent Common bile duct: Diameter: 6 mm Liver: Small hyperechoic region in the left hepatic lobe. This could represent a small hemangioma given the lack of a hepatic calcification on recent CT imaging. This is of doubtful significance. No suspicious mass. Portal vein is patent on color Doppler imaging with normal direction of blood flow towards the liver. Other: There is a small amount of ascites and a small right pleural effusion. IMPRESSION: 1. There is a small amount of ascites and a small right pleural effusion. 2. No other significant abnormalities. Electronically Signed   By: Dorise Bullion III M.D   On:  09/22/2017 12:43        Scheduled Meds: . amiodarone  400 mg Oral BID  . apixaban  5 mg Oral BID  . bacitracin   Topical BID  . clopidogrel  75 mg Oral Q breakfast  . clotrimazole  1 Applicatorful Vaginal QHS  . metoprolol succinate  25 mg Oral Daily  . nystatin   Topical TID  . sodium chloride flush  3 mL Intravenous Q12H  .  traZODone  25 mg Oral QHS   Continuous Infusions: . sodium chloride    . ampicillin (OMNIPEN) IV Stopped (09/23/17 7471)     LOS: 4 days     Cordelia Poche, MD [facility] 09/23/2017, 10:18 AM Pager: [phone]  If 7PM-7AM, please contact night-coverage [email or web address] Password [email or web address] 09/23/2017, 10:18 AM

## 2017-09-23 NOTE — Discharge Summary (Signed)
Physician Discharge Summary  Bianca [name] VEH:[id number] DOB: 09/26/1932 DOA: 09/19/2017  PCP: Maurice Small, MD  Admit date: 09/19/2017 Discharge date: 09/23/2017  Admitted From: Home Disposition: Hospice  Recommendations for Outpatient Follow-up:  1. Residential hospice  Discharge Condition: Hospice CODE STATUS: DNR Diet recommendation: Comfort feeds   Brief/Interim Summary:  Admission HPI written by Erin Hearing, NP   Chief Complaint: Altered mental status, excessive bleeding from laceration left pinky finger, increased weight and increased lower extremity edema  HPI: Bianca [name] is a 84 y.o. female with medical history significant for new diagnosis of CAD with recent PCI to LAD March 8366, systolic heart failure with an EF of 25%, history of ICD/pacer with Optivol capabilities, encephalopathy secondary to hyper ammonia in the context of passive hepatic congestion from heart failure, atrial fibrillation with recent issues with rhythm control on Eliquis, recurrent UTI, history of V. tach, stage III chronic kidney disease and dyslipidemia.  Patient has recently been discharged from skilled nursing facility last week and has returned to her independent living.  While she was the SNF she began exhibiting signs of UTI so urinalysis and culture was obtained but since unclear as to causative organism initiation of antibiotic therapy was delayed until culture results available about 2 days ago.  Subsequent culture positive for multidrug-resistant enterococcus sensitive to ampicillin, Macrobid, linezolid and vancomycin.  Patient has taken at least 2 doses of Macrobid.  Today the family was called to the independent living facility because patient had apparently injured herself and there was a significant amount of blood noted by staff.  Upon family arrival confirmed patient with small laceration to pinky finger with significant bleeding noted at that time-patient is on Eliquis.  Family  reports large pool of blood on patient's bed as well as blood splattered all over the room as well as bloody hand cramps on the walls and doors.  Patient also was confused and not at her baseline mentation.  Family noted that patient also had increasing pedal and lower extremity edema.  Daughter states that past 2 weights have been 206 and 209 pounds.  Because of all of the above patient was brought to the ER for further evaluation and treatment.  In the ER patient's blood pressures were normal for her with systolic ranges in the low 100s.  She was confirmed with increasing lower extremity edema.  Room air saturations have dipped as low as 29% prompting application of nasal cannula oxygen.  Chest x-ray was unremarkable but BNP was elevated at 2673.  She also had subtle elevation in troponin at 0.06.  Because of her significant cardiac history cardiology was consulted and agreed that patient likely has acute heart failure exacerbation and have given a dose of Lasix 80 mg IV x1.  In addition patient had borderline acute kidney injury with baseline creatinine rising from 1.19 to 1.56.  Patient apparently has issues with chronic urinary incontinence.  She had difficulty voiding while here and a bladder scan was performed that revealed 528 cc of urine so went in and out catheterization was performed and subsequently Purewick urinary collection device was applied by the nursing staff.  Patient has remained somewhat lethargic despite improving her O2 saturations and given history of metabolic encephalopathy in the context of elevated ammonia levels in the past ammonia level has been obtained but not yet resulted.  I have also reviewed the urine culture results from previous nursing facility with sensitivities and have discussed with pharmacy plan is to  institute IV ampicillin.  Blood cultures were also be collected given concerns of a possible sepsis physiology evolving.  Current lactate is normal and as noted patient's  blood pressure is at baseline for her.  ED Course:  Vital Signs: BP 104/68   Pulse 89   Temp 98.1 F (36.7 C) (Oral)   Resp 18   LMP  (LMP Unknown)   SpO2 97%  Chest x-ray: Neg CT head: No acute abnormality Lab data: Sodium 134, potassium 4.4, chloride 96, CO2 23, glucose 113, BUN 41, creatinine 1.56, LFTs normal except for mildly elevated total bilirubin 1.9, BNP 2674, troponin 0.06, lactic acid 1.89, white count 7400 with normal differential noting patient currently on Macrobid, hemoglobin 11.9, platelets 246,000, urinalysis unremarkable noting patient currently on Macrobid, repeat urine culture pending from ER Medications and treatments: Lasix 80 mg IV x1    Hospital course:  Acute on chronic respiratory failure with hypoxia Likely secondary to acute heart failure. No distress. Attempt to wean to room air  Acute on chronic systolic heart failure Appears improved with lasix. Spironolactone held secondary to hyperkalemia. AICD turned off after goals of care discussed.  Non-sustained V-tach Amiodarone 400 mg BID. Now comfort care.  Paroxysmal atrial fibrillation On amiodarone and Eliquis. Comfort care.  Acute kidney injury on CKD 3 Baseline around 1.2, up to 2.5 today. Thought secondary to diuresis however patient has not had lasix in a few days. Possibly secondary to hypoperfusion from failing heart. Spironolactone held. UOP 600 mL over last 24 hours. Comfort care.  Elevated LFTs Hyperbilirubinemia Acute rise. down trending AST/ALT with stable bilirubin. ?shock liver. RUQ ultrasound significant for mild ascites.  Altered mental status. Improved. ?hepatic encephalopathy, however, ammonia normal. No bowel movement.  Laceration of left finger  Enterococcal UTI Sensitive to ampicillin. Was treating. Now comfort care.  Elevated troponin Mild. Flat trend. Likely secondary to CHF.  Hypokalemia Resolved. Now hyperkalemia.  Hyperkalemia Mild. In setting of  AKI. Slightly up trending. Comfort care  Leukocytosis Unknown etiology. No source. Treating UTI. Afebrile.  Hallucinations Unknown etiology. BUN is elevated and continues to rise. Patient not a danger to herself at this time.    Discharge Diagnoses:  Principal Problem:   Acute respiratory failure with hypoxia ([place]) Active Problems:   Goals of care, counseling/discussion   AICD (automatic cardioverter/defibrillator) present   Acute systolic heart failure (HCC)   High cholesterol   History of sustained ventricular tachycardia   Coronary artery disease   Acute metabolic encephalopathy   Recent urinary tract infection   Elevated troponin   AF (paroxysmal atrial fibrillation) (HCC)   Chronic kidney disease (CKD), stage III (moderate) (HCC)   Acute on chronic respiratory failure with hypoxia (HCC)   Enterococcus UTI   Yeast vaginitis   Sepsis ([place])   Laceration of left pinky finger   Palliative care by specialist    Discharge Instructions   Allergies as of 09/23/2017      Reactions   Quinapril Hcl Swelling   Tongue and throat   Tape Other (See Comments)   Plastic tape causes irritation.    Coreg [carvedilol]    Very sleepy    Tessalon [benzonatate] Swelling, Other (See Comments)   Capsule opened in mouth, not an allergy   Neosporin [neomycin-polymyxin-gramicidin] Rash      Medication List    STOP taking these medications   acetaminophen 650 MG CR tablet Commonly known as:  TYLENOL   albuterol (2.5 MG/3ML) 0.083% nebulizer solution Commonly known as:  PROVENTIL  allopurinol 100 MG tablet Commonly known as:  ZYLOPRIM   allopurinol 300 MG tablet Commonly known as:  ZYLOPRIM   amiodarone 200 MG tablet Commonly known as:  PACERONE   aspirin 81 MG chewable tablet   atorvastatin 40 MG tablet Commonly known as:  LIPITOR   CALCIUM/VITAMIN D/MINERALS 600-200 MG-UNIT Tabs   CHLORASEPTIC 6-10 MG lozenge Generic drug:  benzocaine-menthol   clopidogrel 75 MG  tablet Commonly known as:  PLAVIX   colestipol 1 g tablet Commonly known as:  COLESTID   ELIQUIS 5 MG Tabs tablet Generic drug:  apixaban   gabapentin 400 MG capsule Commonly known as:  NEURONTIN   guaiFENesin 600 MG 12 hr tablet Commonly known as:  MUCINEX   isosorbide mononitrate 30 MG 24 hr tablet Commonly known as:  IMDUR   meclizine 12.5 MG tablet Commonly known as:  ANTIVERT   metolazone 2.5 MG tablet Commonly known as:  ZAROXOLYN   metoprolol succinate 25 MG 24 hr tablet Commonly known as:  TOPROL-XL   nitrofurantoin (macrocrystal-monohydrate) 100 MG capsule Commonly known as:  MACROBID   nitroGLYCERIN 0.4 MG SL tablet Commonly known as:  NITROSTAT   nortriptyline 25 MG capsule Commonly known as:  PAMELOR   OXYGEN   pantoprazole 40 MG tablet Commonly known as:  PROTONIX   potassium chloride SA 20 MEQ tablet Commonly known as:  K-DUR,KLOR-CON   PROBIOTIC PO   senna-docusate 8.6-50 MG tablet Commonly known as:  Senokot-S   spironolactone 25 MG tablet Commonly known as:  ALDACTONE   torsemide 20 MG tablet Commonly known as:  DEMADEX   Vitamin D3 1000 units Caps       Allergies  Allergen Reactions  . Quinapril Hcl Swelling    Tongue and throat  . Tape Other (See Comments)    Plastic tape causes irritation.   . Coreg [Carvedilol]     Very sleepy   . Tessalon [Benzonatate] Swelling and Other (See Comments)    Capsule opened in mouth, not an allergy  . Neosporin [Neomycin-Polymyxin-Gramicidin] Rash    Consultations:  Heart failure  Palliative care medicine   Procedures/Studies: Dg Chest 2 View  Result Date: 09/19/2017 CLINICAL DATA:  UTI, pt has been receving antibiotic tx x3 days. Per family pt is confused, normally is A+Ox4 and able to hold a conversation, is now oriented x3. Pt has a laceration to right pinky, neither pt nor family able to recall what caused it, bleeding controlled at this time. Pt is on anticoagulants. Per family  pt has an increase in swelling to both legs, has not taken her am meds today. Pt has hx of CHF, has a non-pacemaker. Family at bedside EXAM: CHEST - 2 VIEW COMPARISON:  08/29/2017 FINDINGS: Lungs are clear. Stable moderate cardiomegaly.  Stable left subclavian AICD. No effusion. Anterior vertebral endplate spurring at multiple levels in the mid thoracic spine. IMPRESSION: Stable cardiomegaly.  No acute disease. Electronically Signed   By: Lucrezia Europe M.D.   On: 09/19/2017 10:13   Dg Chest 2 View  Result Date: 08/27/2017 CLINICAL DATA:  Cough EXAM: CHEST - 2 VIEW COMPARISON:  August 22, 2017 FINDINGS: There is no edema or consolidation. There is cardiomegaly with pulmonary vascularity within normal limits. Pacemaker leads are attached to the right atrium, right ventricle, and coronary sinus. No pneumothorax. No adenopathy. There is degenerative change in the thoracic spine. IMPRESSION: Cardiomegaly. Pacemaker leads as described. No edema or consolidation. Electronically Signed   By: Lowella Grip III M.D.  On: 08/27/2017 12:13   Ct Head Wo Contrast  Result Date: 09/19/2017 CLINICAL DATA:  Altered level of consciousness. Urinary tract infection. EXAM: CT HEAD WITHOUT CONTRAST TECHNIQUE: Contiguous axial images were obtained from the base of the skull through the vertex without intravenous contrast. COMPARISON:  Head CT scan 07/09/2017 and 10/25/2015. FINDINGS: Brain: No evidence of acute infarction, hemorrhage, hydrocephalus, extra-axial collection or mass lesion/mass effect. Cortical atrophy and extensive chronic microvascular ischemic change are noted. Vascular: No hyperdense vessel or unexpected calcification. Skull: Intact. Sinuses/Orbits: Minimal mucosal thickening right sphenoid sinus and small mucous retention cyst or polyp right maxillary sinus noted. Other: None. IMPRESSION: No acute abnormality. Atrophy and chronic microvascular ischemic change. Electronically Signed   By: Inge Rise M.D.    On: 09/19/2017 12:16   Ct Abdomen W Contrast  Result Date: 08/28/2017 CLINICAL DATA:  Abnormal liver function.  Hepatic encephalopathy EXAM: CT ABDOMEN WITH CONTRAST TECHNIQUE: Multidetector CT imaging of the abdomen was performed using the standard protocol following bolus administration of intravenous contrast. CONTRAST:  9mL ISOVUE-300 IOPAMIDOL (ISOVUE-300) INJECTION 61% COMPARISON:  CT 10/25/2015 FINDINGS: Lower chest:  Lung bases are clear.  Heart is enlarged. Hepatobiliary: No focal lesion within the liver. No ascites. Postcholecystectomy. No biliary duct dilatation Pancreas: Normal pancreatic parenchymal intensity. No ductal dilatation or inflammation. Spleen: Normal spleen. Adrenals/urinary tract: Adrenal glands and kidneys are normal. Stomach/Bowel: Stomach and limited view of the bowel is unremarkable. There is a laxity of the RIGHT abdominal wall with some shifting of the intraperitoneal contents rightward. Vascular/Lymphatic: No abdominal aneurysm. No retroperitoneal adenopathy Musculoskeletal: No aggressive osseous lesion IMPRESSION: 1. No focal hepatic lesion.  No ascites. 2. No biliary obstruction. 3. Cardiomegaly Electronically Signed   By: Suzy Bouchard M.D.   On: 08/28/2017 19:31   Dg Chest Port 1 View  Result Date: 08/29/2017 CLINICAL DATA:  Shortness of breath. EXAM: PORTABLE CHEST 1 VIEW COMPARISON:  Chest x-ray dated August 27, 2017. FINDINGS: Unchanged left chest wall pacemaker. Stable moderate cardiomegaly. Normal pulmonary vascularity. No focal consolidation, pleural effusion, or pneumothorax. No acute osseous abnormality. IMPRESSION: Stable cardiomegaly.  No active disease. Electronically Signed   By: Titus Dubin M.D.   On: 08/29/2017 15:01   US Abdomen Limited Ruq  Result Date: 09/22/2017 CLINICAL DATA:  Elevated LFTs. EXAM: ULTRASOUND ABDOMEN LIMITED RIGHT UPPER QUADRANT COMPARISON:  CT scan August 28, 2017 FINDINGS: Gallbladder: Surgically absent Common bile duct:  Diameter: 6 mm Liver: Small hyperechoic region in the left hepatic lobe. This could represent a small hemangioma given the lack of a hepatic calcification on recent CT imaging. This is of doubtful significance. No suspicious mass. Portal vein is patent on color Doppler imaging with normal direction of blood flow towards the liver. Other: There is a small amount of ascites and a small right pleural effusion. IMPRESSION: 1. There is a small amount of ascites and a small right pleural effusion. 2. No other significant abnormalities. Electronically Signed   By: Dorise Bullion III M.D   On: 09/22/2017 12:43   US Abdomen Limited Ruq  Result Date: 08/27/2017 CLINICAL DATA:  84 year old female with headache and cephalopathy. Prior cholecystectomy. Initial encounter. EXAM: ULTRASOUND ABDOMEN LIMITED RIGHT UPPER QUADRANT COMPARISON:  10/25/2015 CT. FINDINGS: Gallbladder: Post cholecystectomy. Common bile duct: Diameter: 2.6 mm Liver: 5 mm calcification versus tiny amount of gas left lobe liver. No liver lesion otherwise noted. Portal vein is patent on color Doppler imaging with normal direction of blood flow towards the liver. Small amount of free fluid  along the inferior aspect of the liver. Etiology indeterminate. IMPRESSION: Post cholecystectomy. 5 mm calcification versus tiny amount a gas left lobe liver. Small amount of free fluid along the inferior aspect the liver. Etiology indeterminate. These results will be called to the ordering clinician or representative by the Radiologist Assistant, and communication documented in the PACS or zVision Dashboard. Electronically Signed   By: Genia Del M.D.   On: 08/27/2017 12:42     Transthoracic Echocardiogram (4/7) Study Conclusions  - Left ventricle: The cavity size was severely dilated. Wall   thickness was increased in a pattern of mild LVH. Systolic   function was severely reduced. The estimated ejection fraction   was 15%. Diffuse hypokinesis. There is  akinesis of the   inferolateral myocardium. Doppler parameters are consistent with   restrictive physiology, indicative of decreased left ventricular   diastolic compliance and/or increased left atrial pressure. - Mitral valve: Calcified annulus. There was severe regurgitation. - Left atrium: The atrium was severely dilated. - Right ventricle: The cavity size was mildly dilated. Systolic   function was moderately reduced. - Tricuspid valve: There was moderate regurgitation. - Pulmonary arteries: Systolic pressure was mildly increased.  Impressions:  - Full study not performed because pt declined; global hypokinesis   with akinesis of the inferolateral wall; overall severely reduced   LV systolic function; mild LVH; severe LVE; severe MR; severe   LAE; mild RVE; moderate RV dysfunction; moderate TR with mild   pulmonary hypertension.   Subjective: States she has no concerns. Hallucinating. Having a conversation with someone in the direction of her window.  Discharge Exam: Vitals:   09/23/17 0416 09/23/17 0838  BP: (!) 130/118   Pulse: 60   Resp:  (!) 21  Temp: 97.9 F (36.6 C) 98 F (36.7 C)  SpO2: 100% 98%   Vitals:   09/22/17 2305 09/22/17 2352 09/23/17 0416 09/23/17 0838  BP: 105/70 (!) 70/53 (!) 130/118   Pulse:  (!) 109 60   Resp:  18  (!) 21  Temp:  98.1 F (36.7 C) 97.9 F (36.6 C) 98 F (36.7 C)  TempSrc:  Oral Oral Oral  SpO2:  100% 100% 98%  Weight:   99.9 kg (220 lb 3.8 oz)   Height:        General exam: Appears calm and comfortable Respiratory system: Diminished. Respiratory effort normal. Cardiovascular system: S1 & S2 heard, RRR. No murmurs, rubs, gallops or clicks. Gastrointestinal system: Abdomen is nondistended but obese, soft and nontender. No organomegaly or masses felt. Normal bowel sounds heard. Central nervous system: Alert and oriented to person only. Extremities: No edema. No calf tenderness Skin: No cyanosis. No rashes Psychiatry:  having a conversation with herself. Hallucinations.    The results of significant diagnostics from this hospitalization (including imaging, microbiology, ancillary and laboratory) are listed below for reference.     Microbiology: Recent Results (from the past 240 hour(s))  Urine culture     Status: None   Collection Time: 09/19/17  2:43 PM  Result Value Ref Range Status   Specimen Description URINE, CLEAN CATCH  Final   Special Requests Normal  Final   Culture   Final    NO GROWTH Performed at [facility], [address], [place], [place] [address]    Report Status 09/20/2017 FINAL  Final  Culture, blood (Routine X 2) w Reflex to ID Panel     Status: None (Preliminary result)   Collection Time: 09/19/17  5:40 PM  Result Value Ref Range Status   Specimen Description BLOOD LEFT ANTECUBITAL  Final   Special Requests   Final    BOTTLES DRAWN AEROBIC AND ANAEROBIC Blood Culture results may not be optimal due to an excessive volume of blood received in culture bottles   Culture   Final    NO GROWTH 3 DAYS Performed at [facility] [address], [place], [place] [address]    Report Status PENDING  Incomplete  Culture, blood (Routine X 2) w Reflex to ID Panel     Status: None (Preliminary result)   Collection Time: 09/19/17  5:51 PM  Result Value Ref Range Status   Specimen Description BLOOD LEFT HAND  Final   Special Requests   Final    BOTTLES DRAWN AEROBIC ONLY Blood Culture results may not be optimal due to an excessive volume of blood received in culture bottles   Culture   Final    NO GROWTH 3 DAYS Performed at [facility], [place] [address], [place], [place] [address]    Report Status PENDING  Incomplete  MRSA PCR Screening     Status: None   Collection Time: 09/19/17  8:28 PM  Result Value Ref Range Status   MRSA by PCR NEGATIVE NEGATIVE Final    Comment:        The GeneXpert MRSA Assay (FDA approved for NASAL specimens only), is one component of  a comprehensive MRSA colonization surveillance program. It is not intended to diagnose MRSA infection nor to guide or monitor treatment for MRSA infections. Performed at [facility], [place] [address], [place], [place] [address]      Labs: BNP (last 3 results) Recent Labs    07/30/17 0842 08/22/17 2115 09/19/17 1038  BNP 1,664.3* 962.0* 6,767.2*   Basic Metabolic Panel: Recent Labs  Lab 09/19/17 1038 09/20/17 0428 09/20/17 1104 09/21/17 0301 09/22/17 0453 09/23/17 0435  NA 134* 139 138 134* 132* 126*  K 4.4 3.1* 3.2* 4.6 5.4* 5.6*  CL 96* 96* 99* 95* 92* 89*  CO2 23 27 27 26 22  21*  GLUCOSE 113* 91 122* 112* 86 88  BUN 41* 34* 32* 39* 55* 69*  CREATININE 1.56* 1.17* 1.19* 1.87* 2.21* 2.50*  CALCIUM 9.7 9.1 9.0 8.8* 9.1 8.6*  MG 1.9  --  2.4  --   --  2.3   Liver Function Tests: Recent Labs  Lab 09/19/17 1038 09/22/17 0453 09/23/17 0435  AST 29 1,348* 842*  ALT 16 901* 789*  ALKPHOS 77 81 88  BILITOT 1.9* 2.9* 3.2*  PROT 7.1 6.2* 6.1*  ALBUMIN 3.9 3.5  --    No results for input(s): LIPASE, AMYLASE in the last 168 hours. Recent Labs  Lab 09/19/17 1732 09/21/17 1339  AMMONIA 40* 31   CBC: Recent Labs  Lab 09/19/17 1038 09/19/17 1732 09/22/17 0453  WBC 7.4 7.7 14.0*  NEUTROABS 4.5 4.2  --   HGB 11.9* 11.6* 12.0  HCT 37.4 37.4 39.9  MCV 91.9 91.9 92.8  PLT 246 241 282   Cardiac Enzymes: Recent Labs  Lab 09/19/17 1038 09/19/17 1732 09/19/17 2211 09/20/17 0428  TROPONINI 0.06* 0.06* 0.06* 0.06*   BNP: Invalid input(s): POCBNP CBG: Recent Labs  Lab 09/20/17 0628 09/21/17 1156  GLUCAP 94 144*   D-Dimer No results for input(s): DDIMER in the last 72 hours. Hgb A1c No results for input(s): HGBA1C in the last 72 hours. Lipid Profile No results for input(s): CHOL, HDL, LDLCALC,  TRIG, CHOLHDL, LDLDIRECT in the last 72 hours. Thyroid function studies No results for input(s): TSH, T4TOTAL, T3FREE, THYROIDAB in the last 72  hours.  Invalid input(s): FREET3 Anemia work up No results for input(s): VITAMINB12, FOLATE, FERRITIN, TIBC, IRON, RETICCTPCT in the last 72 hours. Urinalysis    Component Value Date/Time   COLORURINE YELLOW 09/19/2017 [place] 09/19/2017 0947   LABSPEC 1.013 09/19/2017 0947   PHURINE 5.0 09/19/2017 0947   GLUCOSEU NEGATIVE 09/19/2017 0947   HGBUR NEGATIVE 09/19/2017 0947   BILIRUBINUR NEGATIVE 09/19/2017 [place] 09/19/2017 0947   PROTEINUR NEGATIVE 09/19/2017 0947   UROBILINOGEN 0.2 11/21/2012 1750   NITRITE NEGATIVE 09/19/2017 0947   LEUKOCYTESUR NEGATIVE 09/19/2017 0947   Sepsis Labs Invalid input(s): PROCALCITONIN,  WBC,  LACTICIDVEN Microbiology Recent Results (from the past 240 hour(s))  Urine culture     Status: None   Collection Time: 09/19/17  2:43 PM  Result Value Ref Range Status   Specimen Description URINE, CLEAN CATCH  Final   Special Requests Normal  Final   Culture   Final    NO GROWTH Performed at [facility], [place] [address] [place], [place] [address]    Report Status 09/20/2017 FINAL  Final  Culture, blood (Routine X 2) w Reflex to ID Panel     Status: None (Preliminary result)   Collection Time: 09/19/17  5:40 PM  Result Value Ref Range Status   Specimen Description BLOOD LEFT ANTECUBITAL  Final   Special Requests   Final    BOTTLES DRAWN AEROBIC AND ANAEROBIC Blood Culture results may not be optimal due to an excessive volume of blood received in culture bottles   Culture   Final    NO GROWTH 3 DAYS Performed at [facility], [place] [address] [place], [place] [address]    Report Status PENDING  Incomplete  Culture, blood (Routine X 2) w Reflex to ID Panel     Status: None (Preliminary result)   Collection Time: 09/19/17  5:51 PM  Result Value Ref Range Status   Specimen Description BLOOD LEFT HAND  Final   Special Requests   Final    BOTTLES DRAWN AEROBIC ONLY Blood Culture results may not be  optimal due to an excessive volume of blood received in culture bottles   Culture   Final    NO GROWTH 3 DAYS Performed at [facility], [place] [address] [place], [place] [address]    Report Status PENDING  Incomplete  MRSA PCR Screening     Status: None   Collection Time: 09/19/17  8:28 PM  Result Value Ref Range Status   MRSA by PCR NEGATIVE NEGATIVE Final    Comment:        The GeneXpert MRSA Assay (FDA approved for NASAL specimens only), is one component of a comprehensive MRSA colonization surveillance program. It is not intended to diagnose MRSA infection nor to guide or monitor treatment for MRSA infections. Performed at [facility], [place] [address], [place], [place] [address]      Time coordinating discharge: Over 35 minutes  SIGNED:   Cordelia Poche, MD [facility] 09/23/2017, 12:51 PM Pager [phone]  If 7PM-7AM, please contact night-coverage [email or web address] Password [email or web address]

## 2017-09-23 NOTE — Consult Note (Signed)
   [facility] Inpatient Consult   09/23/2017  Bianca [name] 03-05-1933 [id number]    [facility] Care Management follow up.  Chart reviewed. [name]' discharge plan is for residential hospice.  Will continue to follow and update [facility] Care Management team upon discharge.   Marthenia Rolling, MSN-Ed, RN,BSN [facility] Liaison [phone]

## 2017-09-23 NOTE — Progress Notes (Signed)
[facility] ° °Received request from CSW Jenna for family interest in [facility]. Chart reviewed and met with patient's son and daughter in law to complete paper work for transfer today.  ° °DC summary has been sent to [facility].  ° °RN please call report to [phone]. ° °Thank you,  °Eva Davis, LCSW °[phone] °

## 2017-09-23 NOTE — Consult Note (Signed)
   [facility] Inpatient Consult   09/23/2017  JOEANN STEPPE 01-Jun-1933 [id number]    [facility] Care Management follow up.   Notification sent to [facility] team to make aware that Mrs. Falotico is transitioning to [facility] residential hospice facility today.  No further [facility] Care Management needs identified.   Marthenia Rolling, MSN-Ed, RN,BSN [facility] Liaison [phone]

## 2017-09-24 ENCOUNTER — Other Ambulatory Visit: Payer: Self-pay | Admitting: Pharmacist

## 2017-09-24 LAB — CUP PACEART INCLINIC DEVICE CHECK
Date Time Interrogation Session: 20190409163229
Implantable Lead Implant Date: 20080821
Implantable Lead Implant Date: 20080821
Implantable Lead Location: 753858
Implantable Lead Location: 753859
Implantable Lead Model: 6947
Implantable Pulse Generator Implant Date: 20170913
MDC IDC LEAD IMPLANT DT: 20080821
MDC IDC LEAD LOCATION: 753860

## 2017-09-24 LAB — CULTURE, BLOOD (ROUTINE X 2)
Culture: NO GROWTH
Culture: NO GROWTH

## 2017-09-30 ENCOUNTER — Telehealth: Payer: Self-pay | Admitting: Cardiology

## 2017-09-30 ENCOUNTER — Telehealth: Payer: Self-pay

## 2017-09-30 NOTE — Telephone Encounter (Signed)
Confirmed remote transmission w/ pt daughter in law. [name] that she passed away on 2022-10-06 of last week.

## 2017-09-30 NOTE — Telephone Encounter (Signed)
Spoke with daughter in [employer].  Patient passed away last week.  She was very appreciative of everything that was done for patient.  Expressed my sympathy.  She asked what to do with the [employer] monitor and advised to call the tech service number and they will send a return box.

## 2017-10-11 ENCOUNTER — Other Ambulatory Visit: Payer: Self-pay | Admitting: Internal Medicine

## 2017-10-14 ENCOUNTER — Encounter (HOSPITAL_COMMUNITY): Payer: PPO

## 2017-10-16 NOTE — Progress Notes (Signed)
This encounter was created in error - please disregard.

## 2017-10-16 NOTE — Patient Outreach (Signed)
[facility] Care Management  10/06/17  Bianca [name] 15-Oct-1932 [id number]  After recent hospitalization, patient has transitioned to Hospice care. Pharmacy episode will be closed at this time. Patient's daughter in law, [name], was notified.   Charlett Lango, PharmD Clinical Pharmacist, [facility] [phone]

## 2017-10-16 DEATH — deceased

## 2017-11-20 ENCOUNTER — Encounter (HOSPITAL_COMMUNITY): Payer: PPO | Admitting: Internal Medicine

## 2017-12-16 ENCOUNTER — Encounter: Payer: PPO | Admitting: Internal Medicine

## 2018-10-02 IMAGING — CR DG CHEST 2V
2 series · 2 of 2 positions shown · non-contrast
Comparison: 07/09/2017

CLINICAL DATA: Fatigue, urinary tract infection

EXAM:
CHEST  2 VIEW

[w chest lat]
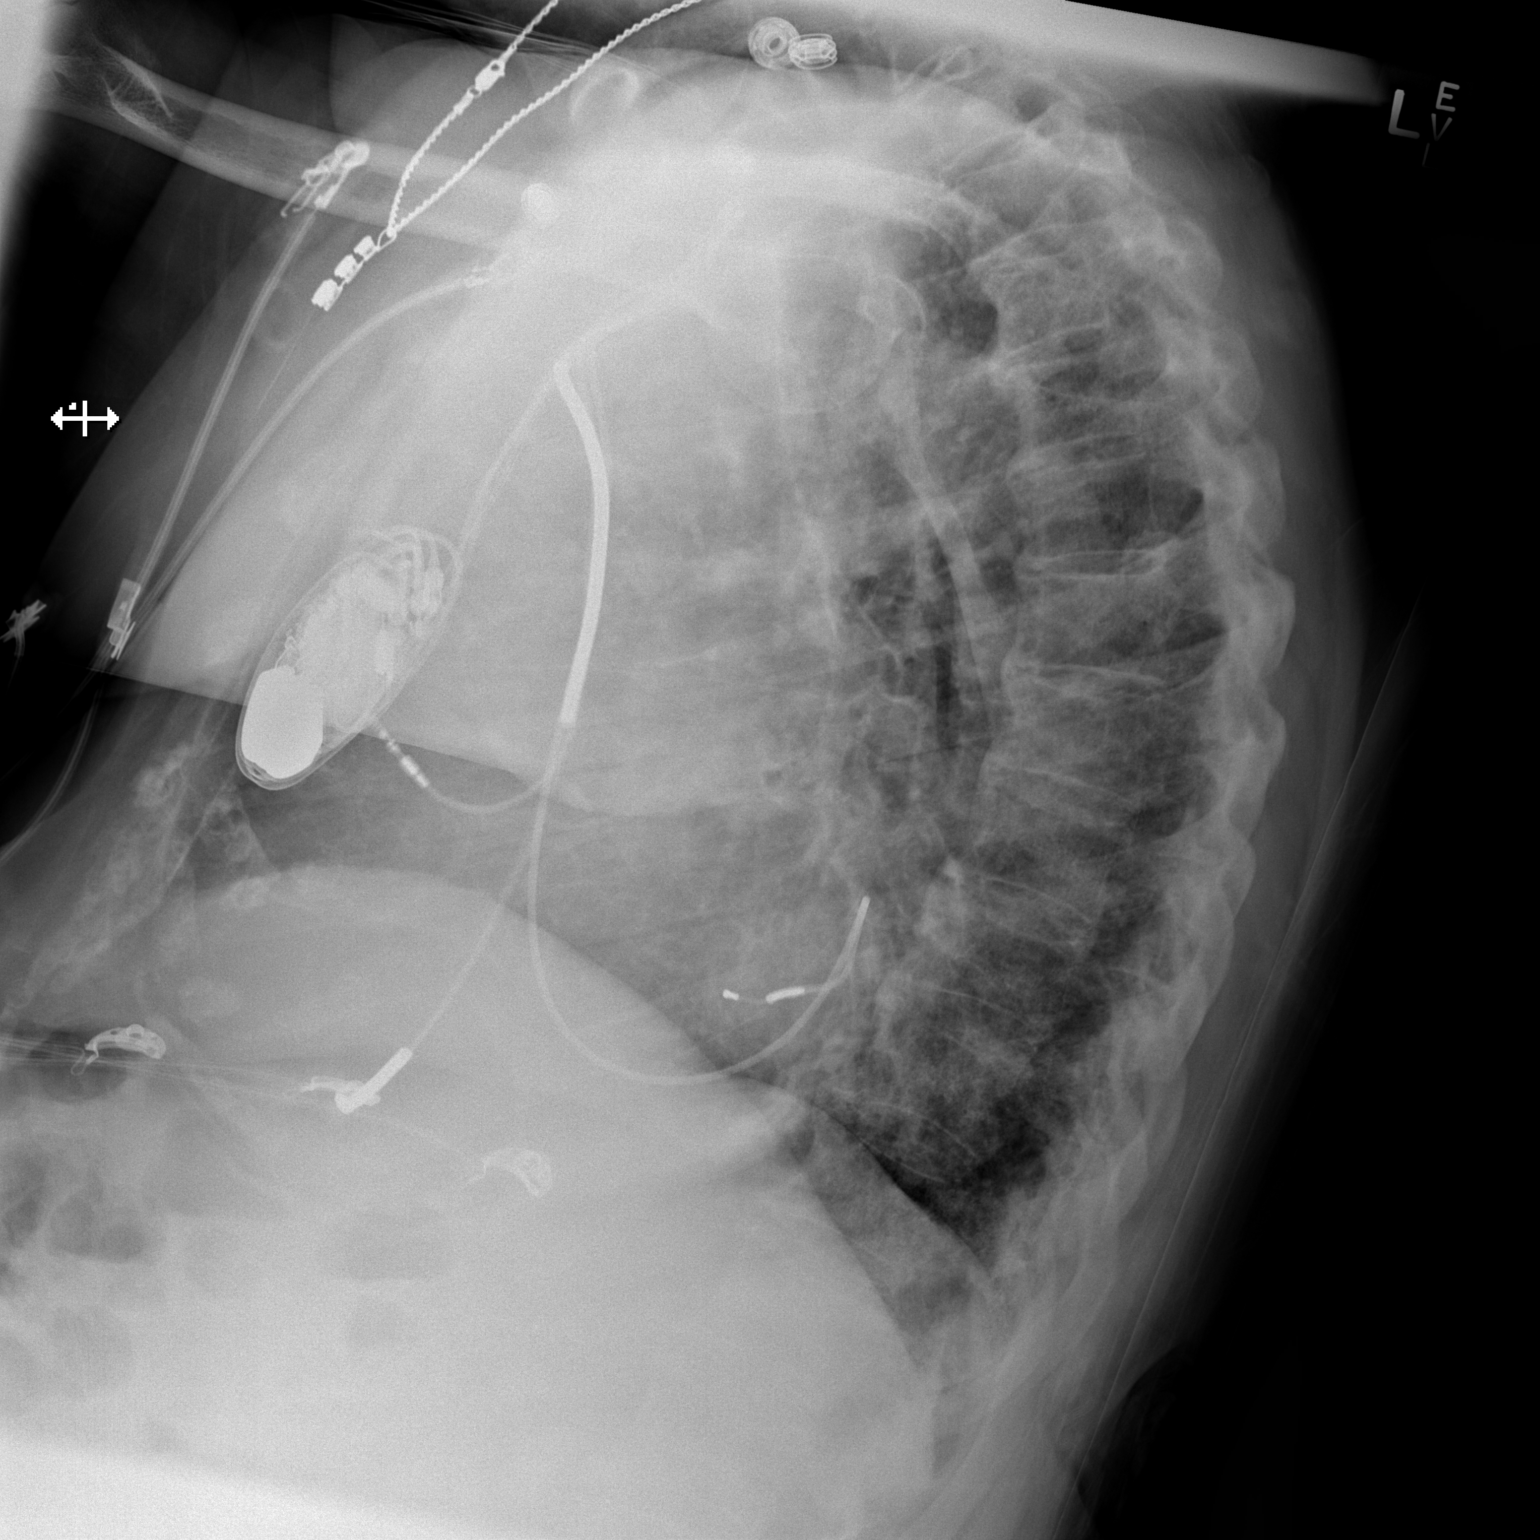

[x chest ap]
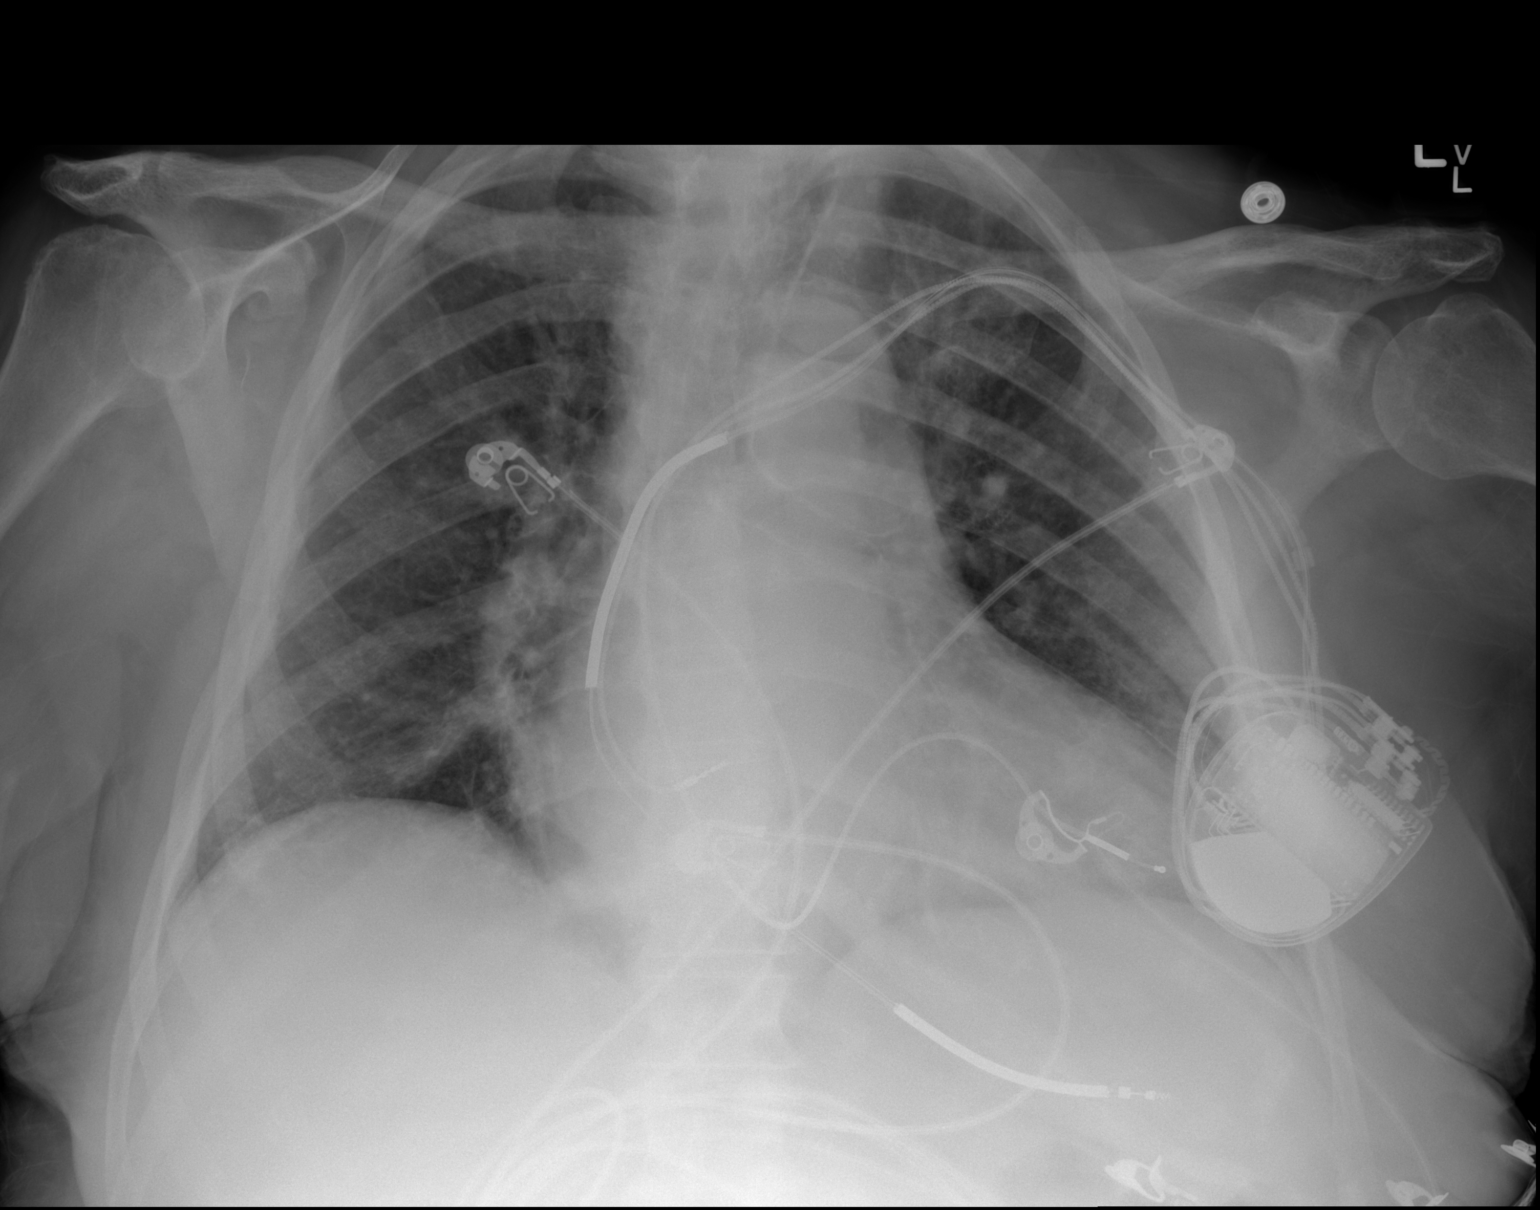

[2 of 2 positions shown; findings below may reference images not displayed]

FINDINGS: LEFT-sided pacemaker overlies stable enlarged cardiac silhouette. No
effusion, infiltrate or pneumothorax. Degenerate spurring of the
spine. Low lung volumes.
IMPRESSION: Cardiomegaly and low lung volumes.  No acute findings

## 2018-10-04 IMAGING — CR DG CHEST 2V
2 series · 2 of 2 positions shown · non-contrast
Comparison: 07/28/2017

CLINICAL DATA: Cough, weakness, coronary disease post MI, stage III
chronic kidney disease, chronic systolic heart failure,
hypertension, paroxysmal atrial fibrillation

EXAM:
CHEST  2 VIEW

[chest lat]
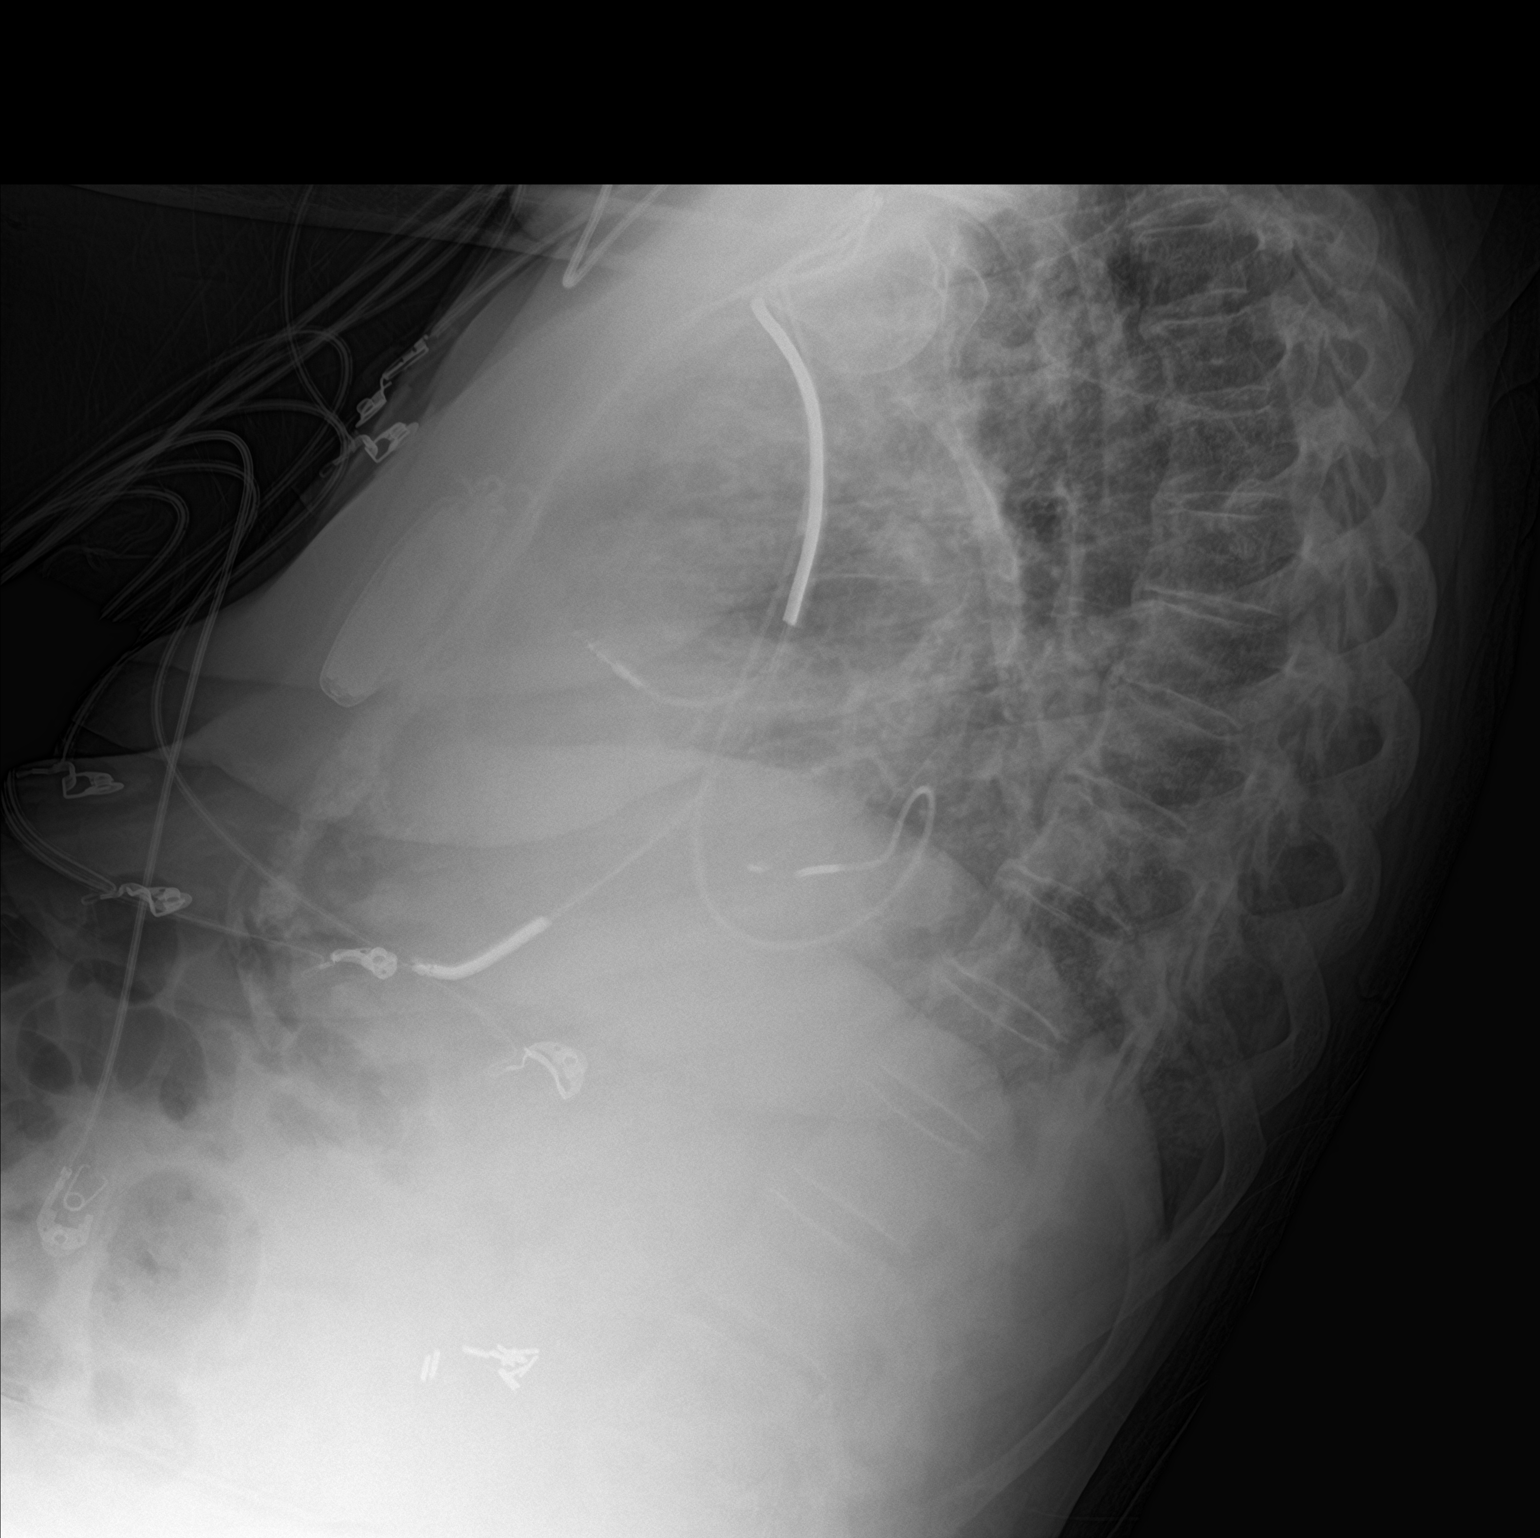

[chest ap]
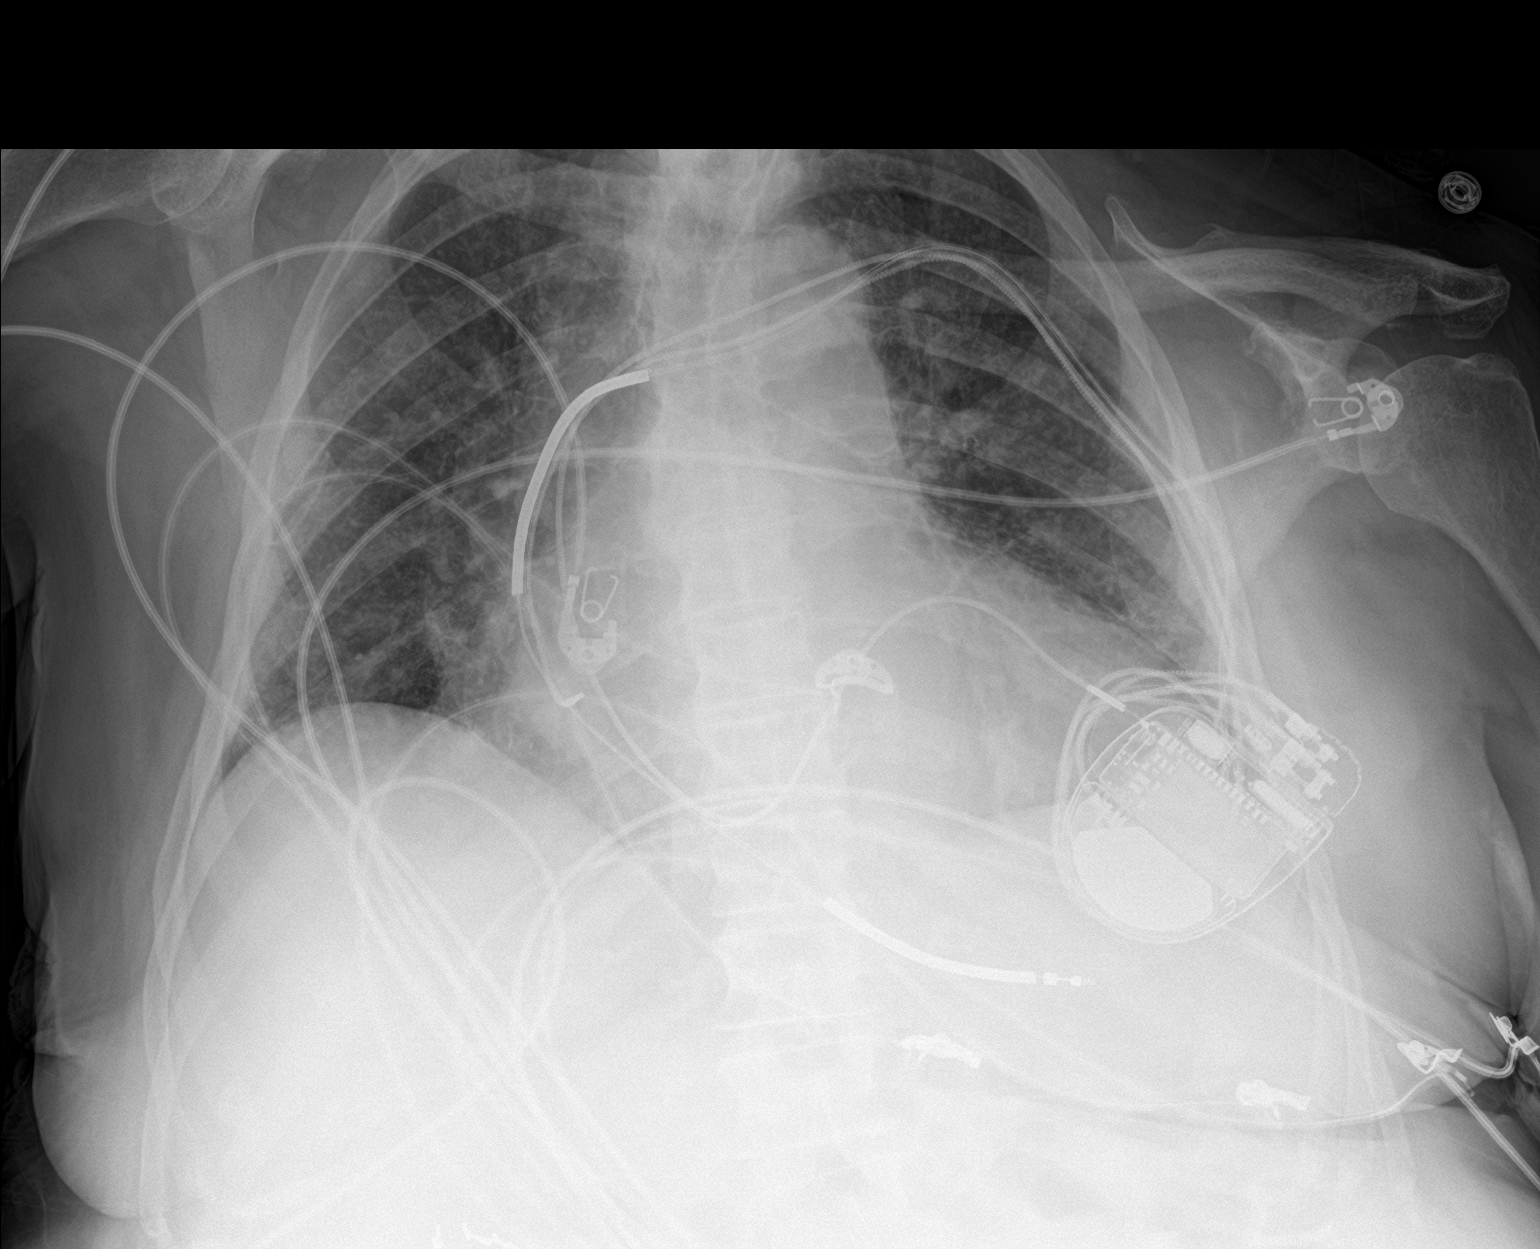

[2 of 2 positions shown; findings below may reference images not displayed]

FINDINGS: LEFT subclavian AICD leads project at RIGHT atrium, RIGHT ventricle
and coronary sinus.

Enlargement of cardiac silhouette with pulmonary vascular
congestion.

Mild interstitial infiltrates new since previous exam likely
reflecting mild pulmonary edema.

No gross pleural effusion or pneumothorax.

Numerous EKG leads project over chest.

No acute osseous findings.
IMPRESSION: Mild CHF.
